# Patient Record
Sex: Female | Born: 1945 | Race: White | Hispanic: No | Smoking: Never smoker
Health system: Southern US, Community
[De-identification: ages and names within clinical notes are randomized; demographics above are authoritative.]

## PROBLEM LIST (undated history)

## (undated) DIAGNOSIS — I219 Acute myocardial infarction, unspecified: Secondary | ICD-10-CM

## (undated) DIAGNOSIS — Z9181 History of falling: Secondary | ICD-10-CM

## (undated) DIAGNOSIS — I10 Essential (primary) hypertension: Secondary | ICD-10-CM

## (undated) DIAGNOSIS — E119 Type 2 diabetes mellitus without complications: Secondary | ICD-10-CM

## (undated) DIAGNOSIS — E1149 Type 2 diabetes mellitus with other diabetic neurological complication: Secondary | ICD-10-CM

## (undated) DIAGNOSIS — I639 Cerebral infarction, unspecified: Secondary | ICD-10-CM

## (undated) HISTORY — PX: APPENDECTOMY: SHX54

## (undated) HISTORY — PX: ABDOMINAL HYSTERECTOMY: SHX81

## (undated) HISTORY — DX: Type 2 diabetes mellitus with other diabetic neurological complication: E11.49

## (undated) HISTORY — PX: BACK SURGERY: SHX140

---
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
## 2019-06-29 ENCOUNTER — Encounter: Payer: Self-pay | Admitting: Emergency Medicine

## 2019-06-29 ENCOUNTER — Other Ambulatory Visit: Payer: Self-pay

## 2019-06-29 ENCOUNTER — Ambulatory Visit (INDEPENDENT_AMBULATORY_CARE_PROVIDER_SITE_OTHER): Payer: Self-pay

## 2019-06-29 ENCOUNTER — Ambulatory Visit
Admission: EM | Admit: 2019-06-29 | Discharge: 2019-06-29 | Disposition: A | Discharge status: Home / Self Care | Payer: Self-pay

## 2019-06-29 DIAGNOSIS — W19XXXA Unspecified fall, initial encounter: Secondary | ICD-10-CM

## 2019-06-29 DIAGNOSIS — R0781 Pleurodynia: Secondary | ICD-10-CM

## 2019-06-29 DIAGNOSIS — M546 Pain in thoracic spine: Secondary | ICD-10-CM

## 2019-06-29 HISTORY — DX: Acute myocardial infarction, unspecified: I21.9

## 2019-06-29 HISTORY — DX: Cerebral infarction, unspecified: I63.9

## 2019-06-29 IMAGING — DX DG THORACIC SPINE 2V
3 series · 3 of 3 positions shown · non-contrast
Comparison: None.

CLINICAL DATA: Pain after fall

EXAM:
THORACIC SPINE 2 VIEWS

[thoracic spine standing ap]
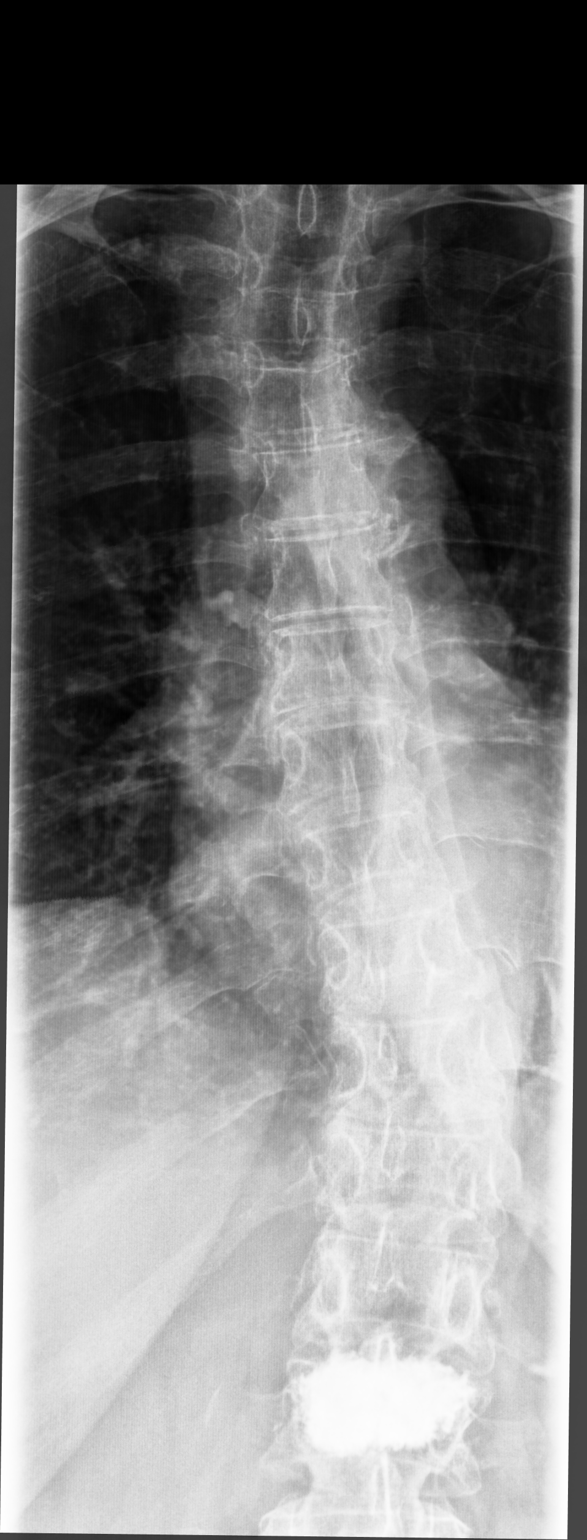

[thoracic spine standing lat (1 of 2)]
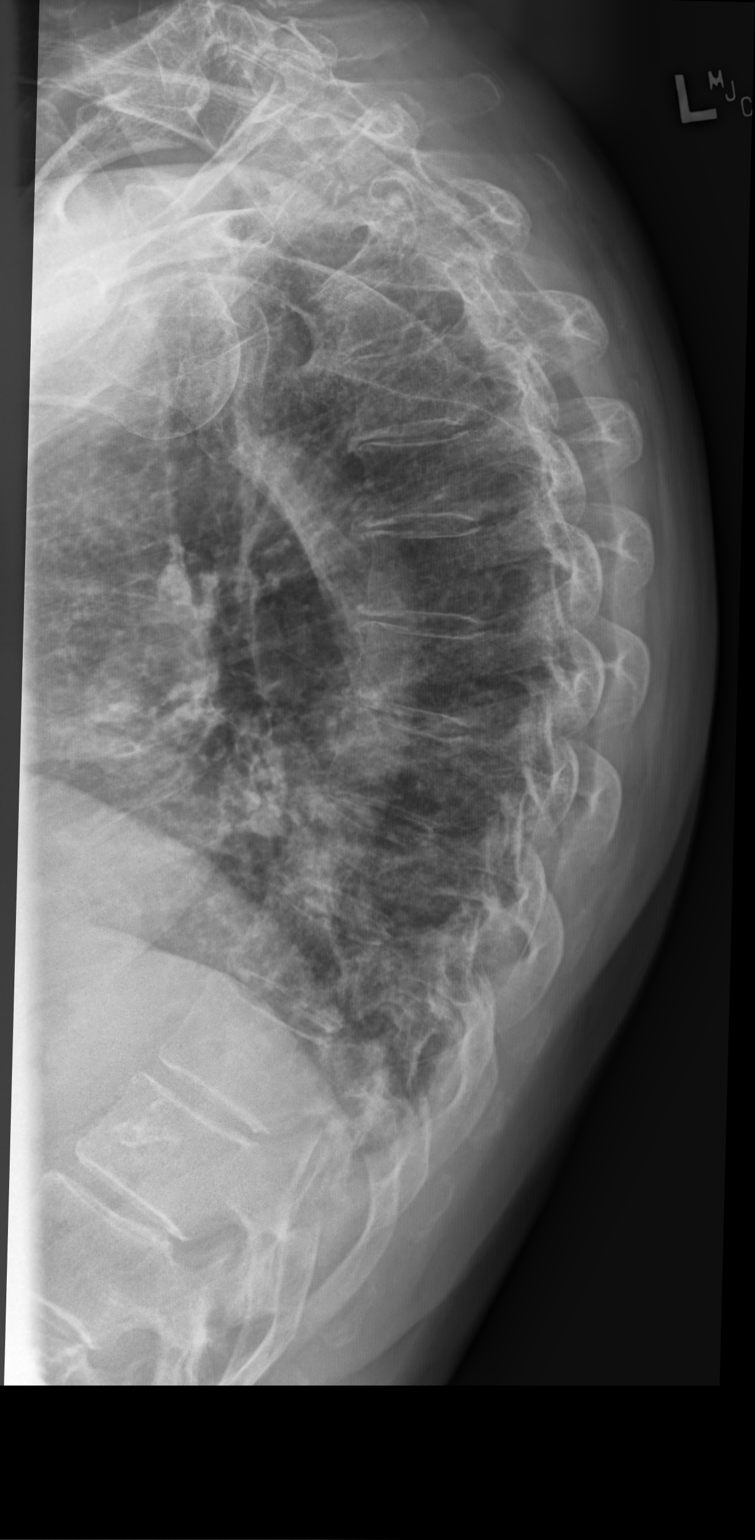

[thoracic spine standing lat (2 of 2)]
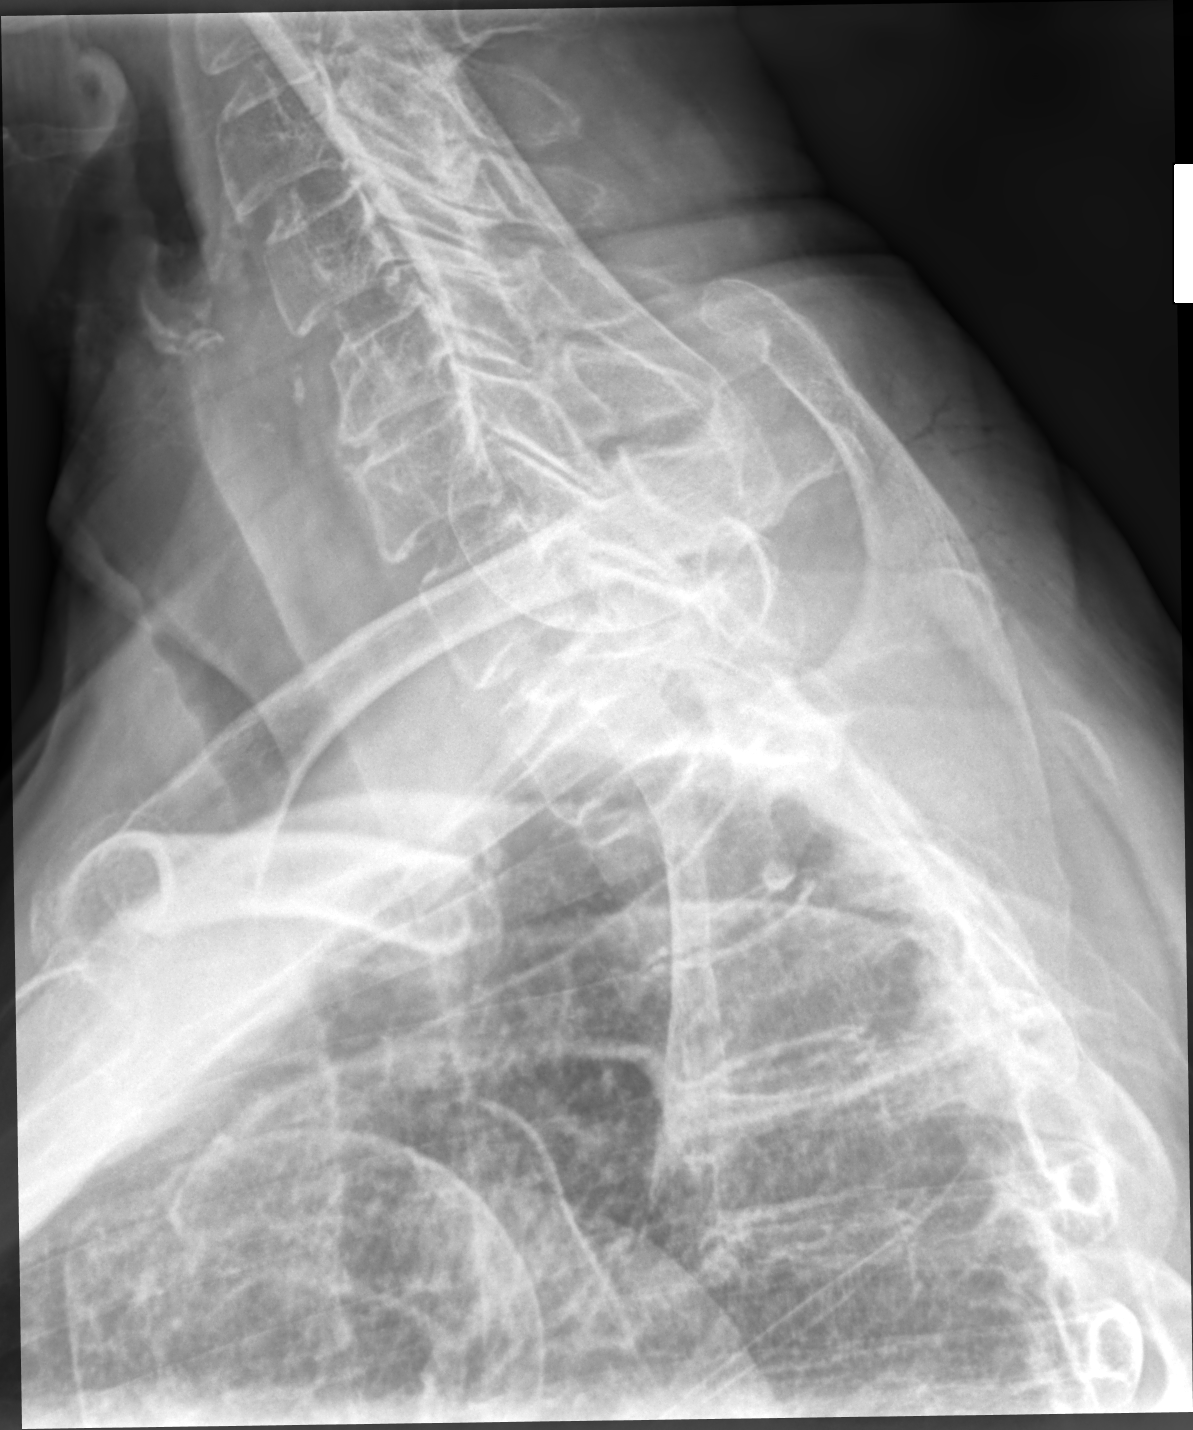

[3 of 3 positions shown; findings below may reference images not displayed]

FINDINGS: Vertebroplasty material seen at L2. Scoliotic curvature of the
thoracic spine is identified. No inter pedicular widening. No
malalignment based on the lateral view. No acute fractures are seen
in the thoracic spine.
IMPRESSION: No acute fractures or traumatic malalignment in the thoracic spine.
L2 vertebroplasty identified.

## 2019-06-29 IMAGING — DX DG RIBS W/ CHEST 3+V*R*
3 series · 3 of 3 positions shown · non-contrast
Comparison: Thoracic spine [DATE]

CLINICAL DATA: Midthoracic pain and right posterior rib pain after
fall.

EXAM:
RIGHT RIBS AND CHEST - 3+ VIEW

[chest pa]
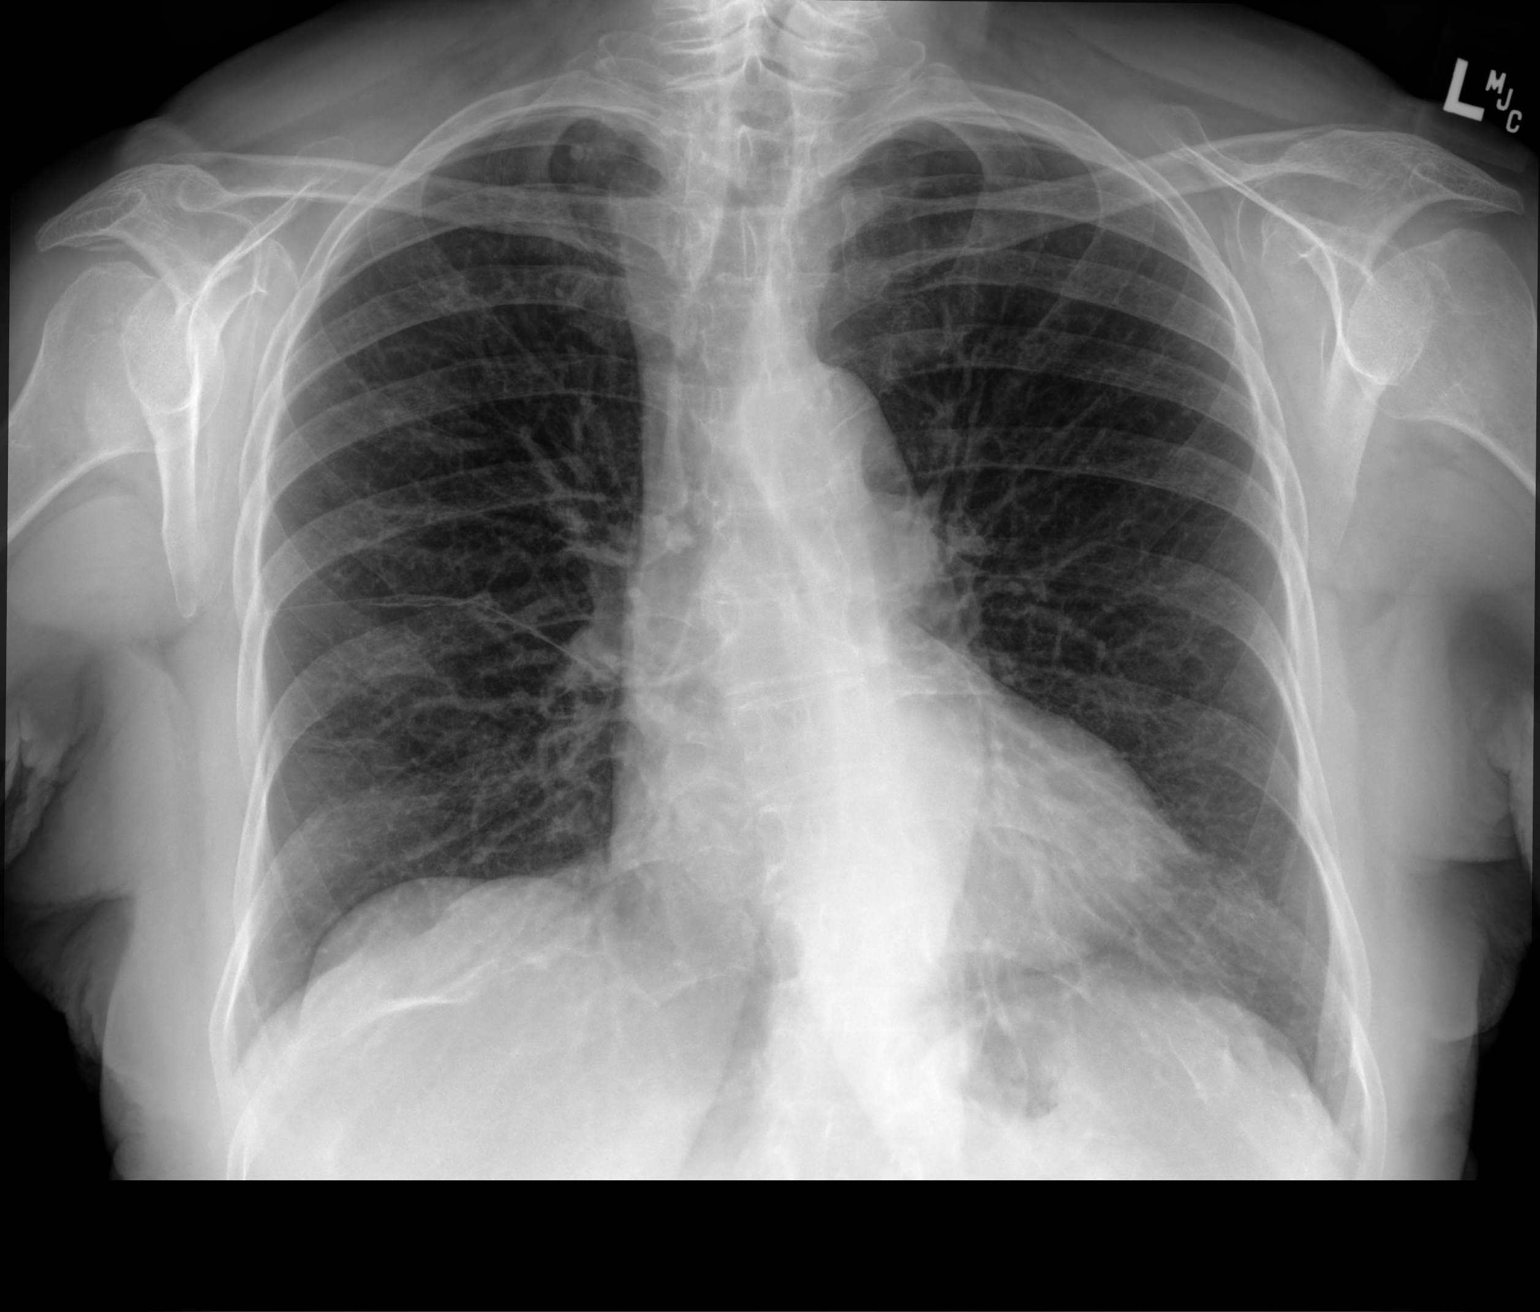

[hemithorax (ribs) ap (1 of 2)]
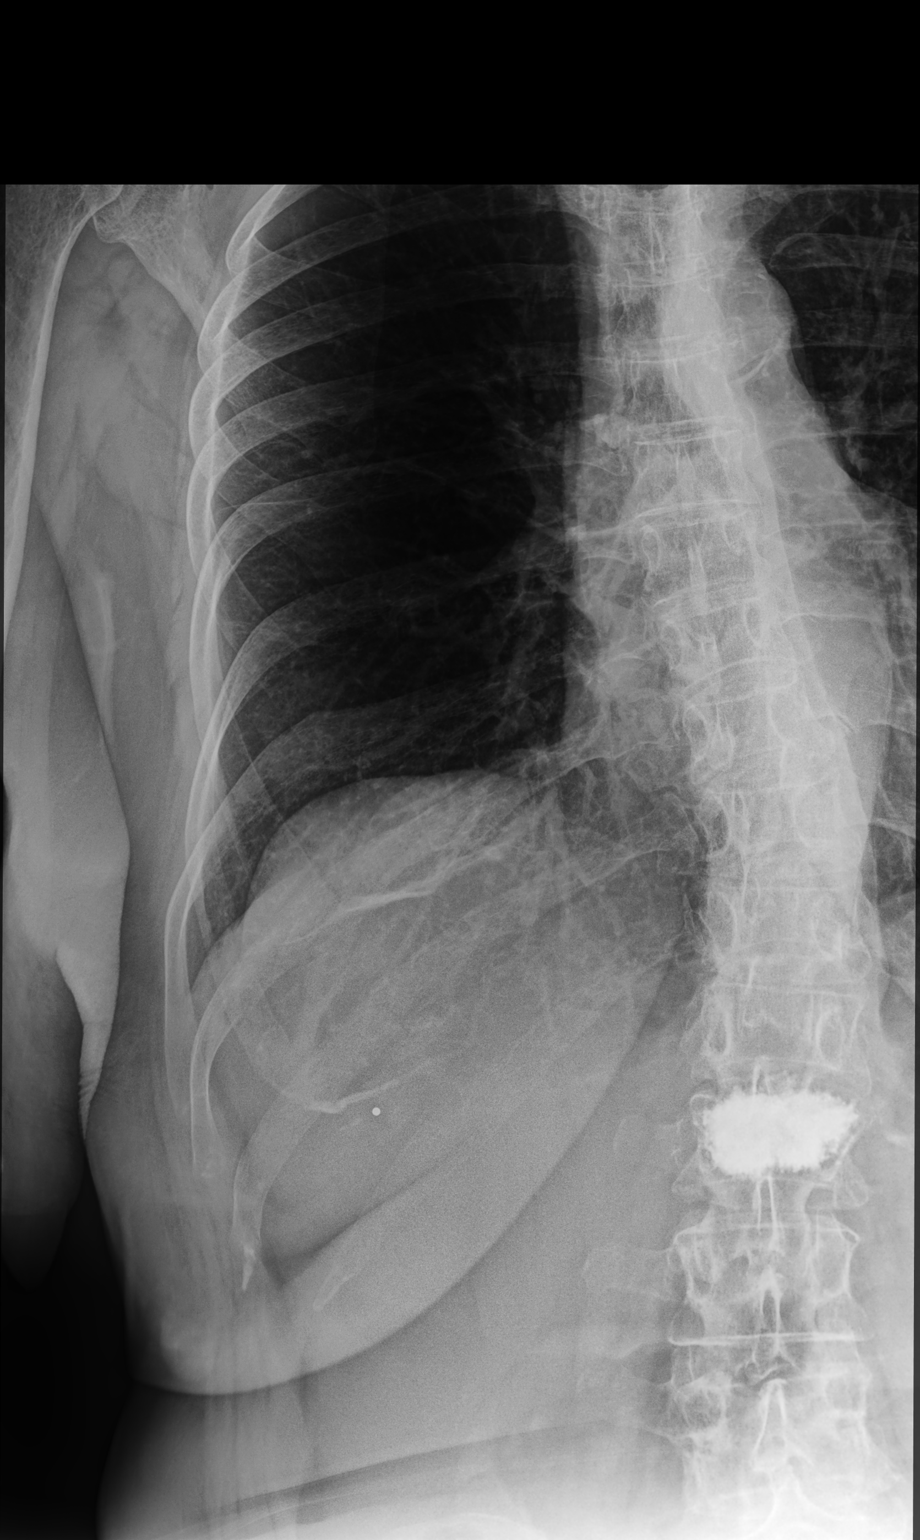

[hemithorax (ribs) ap (2 of 2)]
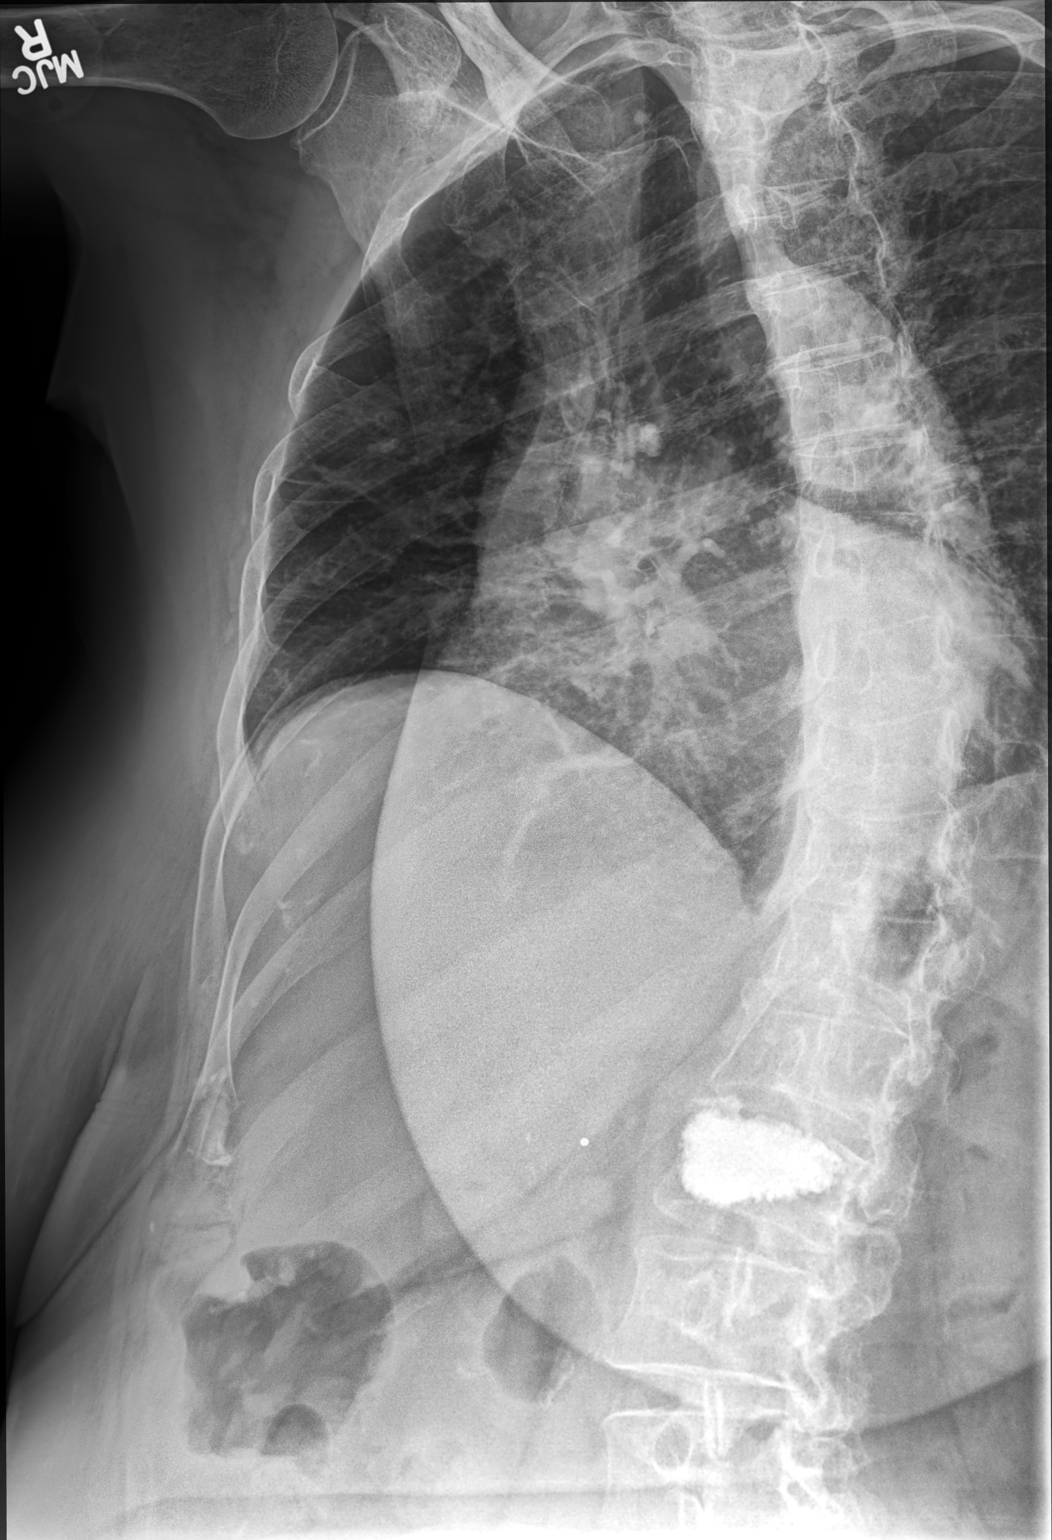

[3 of 3 positions shown; findings below may reference images not displayed]

FINDINGS: Evidence for small calcifications at the right apex. Negative for
pneumothorax. No focal lung disease. Heart and mediastinum are
within normal limits. There is bone cement in the L2 vertebral body.
BB marker was placed in the right lower chest. Negative for a
displaced right rib fracture.
IMPRESSION: 1. No acute cardiopulmonary disease.
2. Negative for right rib fracture.

## 2019-06-29 MED ORDER — PREDNISONE 50 MG PO TABS: 50.0000 mg | ORAL_TABLET | Freq: Every day | ORAL | 0 refills | Status: DC

## 2019-06-29 MED ORDER — KETOROLAC TROMETHAMINE 30 MG/ML IJ SOLN
15.0000 mg | Freq: Once | INTRAMUSCULAR | Status: AC
  Administered 2019-06-29: 15 mg via INTRAMUSCULAR

## 2019-06-29 NOTE — Discharge Instructions (Addendum)
Xray negative for fracture. Toradol injection in office today. Start prednisone as directed. Tylenol to supplement for pain if needed. Warm compress. Make sure to take deep breaths to prevent development of pneumonia. Follow up with orthopedics for further evaluation if symptoms not improving. If worsening, with chest pain/shortness of breath, go to the ED for further evaluation needed.

## 2019-06-29 NOTE — ED Notes (Signed)
Patient able to ambulate independently  

## 2019-06-29 NOTE — ED Triage Notes (Signed)
Pt presents to [facility] for assessment after having two falls on Friday, the first one was early in the morning while she was up in the middle of the night to go to the bathroom.  States she thinks she tripped (cannot exactly remember) and fall backwards onto the bathroom floor, striking a foot bath on her way down with her right lower back.  Intense pain, especially with position changes, to this area since.  Patient denies head injury or LOC.  Patient states she is in the process of moving, and later that day went to sit down on a crate to get to something on a bottom shelf.  States the crate gave out on her and she fell on to the floor, jarring the same area of her back.  Denies head injury or LOC for this fall either.

## 2019-06-29 NOTE — ED Provider Notes (Signed)
[facility]    CSN: [id number] Arrival date & time: 06/29/19  1010      History   Chief Complaint Chief Complaint  Patient presents with   Fall    HPI Tasha [name] is a 73 y.o. female.   73 year old female comes in for back pain after 2 falls.  Patient's first fall was 3 days ago, she was feeling for the restroom and her dark, tripped and fell backwards, hitting a foot bath to the right middle back.  Area has been tender, worse with position change since.  She denies head injury, loss of consciousness.  Patient states later that day, she had pain while walking, and was trying to sit down and rest on a crate.  Crate gave out, and she fell to the floor, hitting the same area of the back.  Again, denies head injury or loss of consciousness.  She points to the mid back when asked about pain, and denies any radiation.  She denies any weakness, dizziness, syncope causing the fall.  She denies chest pain, shortness of breath, abdominal pain, hematuria.  She denies any saddle anesthesia, loss of bladder or bowel control. Has been taking ibuprofen 400mg  Q6H without any relief.      Past Medical History:  Diagnosis Date   Hypertension    MI (myocardial infarction) (HCC)    Stroke (HCC)     There are no active problems to display for this patient.   Past Surgical History:  Procedure Laterality Date   ABDOMINAL HYSTERECTOMY     APPENDECTOMY      OB History   No obstetric history on file.      Home Medications    Prior to Admission medications   Medication Sig Start Date End Date Taking? Authorizing Provider  amLODipine (NORVASC) 10 MG tablet Take 10 mg by mouth daily.   Yes [provider]  predniSONE (DELTASONE) 50 MG tablet Take 1 tablet (50 mg total) by mouth daily with breakfast. 06/29/19   Belinda FisherYu, Cerita Rabelo V, PA-C    Family History Family History  Family history unknown: Yes    Social History Social History   Tobacco Use   Smoking status:  Never Smoker   Smokeless tobacco: Never Used  Substance Use Topics   Alcohol use: Yes    Comment: occasionally   Drug use: Never     Allergies   Allopurinol, Codeine, and Sulfa antibiotics   Review of Systems Review of Systems  Reason unable to perform ROS: See HPI as above.     Physical Exam Triage Vital Signs ED Triage Vitals  Enc Vitals Group     BP 06/29/19 1026 (!) 169/89     Pulse Rate 06/29/19 1026 79     Resp 06/29/19 1026 18     Temp 06/29/19 1026 98.3 F (36.8 C)     Temp Source 06/29/19 1026 Oral     SpO2 06/29/19 1026 96 %     Weight --      Height --      Head Circumference --      Peak Flow --      Pain Score 06/29/19 1027 9     Pain Loc --      Pain Edu? --      Excl. in GC? --    No data found.  Updated Vital Signs BP (!) 169/89 (BP Location: Left Arm)    Pulse 79    Temp 98.3 F (36.8 C) (Oral)  Resp 18    SpO2 96%   Physical Exam Constitutional:      General: She is not in acute distress.    Appearance: She is well-developed. She is not diaphoretic.  HENT:     Head: Normocephalic and atraumatic.  Eyes:     Conjunctiva/sclera: Conjunctivae normal.     Pupils: Pupils are equal, round, and reactive to light.  Neck:     Musculoskeletal: Normal range of motion and neck supple. No pain with movement, spinous process tenderness or muscular tenderness.  Cardiovascular:     Rate and Rhythm: Normal rate and regular rhythm.     Heart sounds: Normal heart sounds. No murmur. No friction rub. No gallop.   Pulmonary:     Effort: Pulmonary effort is normal. No accessory muscle usage or respiratory distress.     Breath sounds: Normal breath sounds. No stridor. No decreased breath sounds, wheezing, rhonchi or rales.  Musculoskeletal:     Comments: No swelling, deformity, contusion seen.  Patient tenderness to palpation of lower thoracic spinous processes.  Has diffuse tenderness to palpation of right thoracic back.  No tenderness to palpation of the  shoulder or hips.  Patient has decreased range of motion of shoulder due to exacerbating back pain.  Full range of motion of the hips.  Sensation intact and equal bilaterally.  Grip strength normal.  Radial pulse 2+, cap refill less than 2 seconds.  Skin:    General: Skin is warm and dry.  Neurological:     Mental Status: She is alert and oriented to person, place, and time.      UC Treatments / Results  Labs (all labs ordered are listed, but only abnormal results are displayed) Labs Reviewed - No data to display  EKG   Radiology Dg Ribs Unilateral W/chest Right  Result Date: 06/29/2019 CLINICAL DATA:  Midthoracic pain and right posterior rib pain after fall. EXAM: RIGHT RIBS AND CHEST - 3+ VIEW COMPARISON:  Thoracic spine 04/29/2019 FINDINGS: Evidence for small calcifications at the right apex. Negative for pneumothorax. No focal lung disease. Heart and mediastinum are within normal limits. There is bone cement in the L2 vertebral body. BB marker was placed in the right lower chest. Negative for a displaced right rib fracture. IMPRESSION: 1. No acute cardiopulmonary disease. 2. Negative for right rib fracture. Electronically Signed   By: Markus Daft M.D.   On: 06/29/2019 11:31   Dg Thoracic Spine 2 View  Result Date: 06/29/2019 CLINICAL DATA:  Pain after fall EXAM: THORACIC SPINE 2 VIEWS COMPARISON:  None. FINDINGS: Vertebroplasty material seen at L2. 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 No inter pedicular widening. No malalignment based on the lateral view. No acute fractures are seen in the thoracic spine. IMPRESSION: No acute fractures or traumatic malalignment in the thoracic spine. L2 vertebroplasty identified. Electronically Signed   By: Dorise Bullion III M.D   On: 06/29/2019 11:34    Procedures Procedures (including critical care time)  Medications Ordered in UC Medications  ketorolac (TORADOL) 30 MG/ML injection 15 mg (15 mg Intramuscular Given 06/29/19  1204)    Initial Impression / Assessment and Plan / UC Course  I have reviewed the triage vital signs and the nursing notes.  Pertinent labs & imaging results that were available during my care of the patient were reviewed by me and considered in my medical decision making (see chart for details).    Given age, 2 falls, with midline tenderness, will obtain thoracic  back xray for evaluation. Will also obtain rib series.   Discussed xray results with patient. Patient at first had denied back trauma/chronic pain, however, remembered surgery after discussing xray results. States she has not had pain/complications since surgery. Will provide toradol injection and prednisone for symptoms. Patient with history of reaction to codeine, will avoid narcotics for now. Strict return precautions given.  Patient expresses understanding and agrees to plan.  Final Clinical Impressions(s) / UC Diagnoses   Final diagnoses:  Acute right-sided thoracic back pain  Rib pain on right side  Fall, initial encounter   ED Prescriptions    Medication Sig Dispense Auth. Provider   predniSONE (DELTASONE) 50 MG tablet Take 1 tablet (50 mg total) by mouth daily with breakfast. 5 tablet Belinda Fisher, PA-C     PDMP not reviewed this encounter.   Belinda Fisher, PA-C 06/29/19 1231

## 2019-06-30 ENCOUNTER — Telehealth: Payer: Self-pay | Admitting: Emergency Medicine

## 2019-06-30 NOTE — Telephone Encounter (Signed)
Left voicemail checking in on patient, and encouraged return call with any continuing questions or concerns.    

## 2020-01-02 ENCOUNTER — Other Ambulatory Visit: Payer: Self-pay

## 2020-01-02 ENCOUNTER — Emergency Department (HOSPITAL_COMMUNITY)
Admission: EM | Admit: 2020-01-02 | Discharge: 2020-01-02 | Disposition: A | Discharge status: Home / Self Care | Payer: Self-pay | Attending: Emergency Medicine | Admitting: Emergency Medicine

## 2020-01-02 ENCOUNTER — Emergency Department (HOSPITAL_COMMUNITY): Payer: Self-pay

## 2020-01-02 ENCOUNTER — Encounter (HOSPITAL_COMMUNITY): Payer: Self-pay

## 2020-01-02 DIAGNOSIS — Y929 Unspecified place or not applicable: Secondary | ICD-10-CM | POA: Insufficient documentation

## 2020-01-02 DIAGNOSIS — Z8673 Personal history of transient ischemic attack (TIA), and cerebral infarction without residual deficits: Secondary | ICD-10-CM | POA: Insufficient documentation

## 2020-01-02 DIAGNOSIS — I10 Essential (primary) hypertension: Secondary | ICD-10-CM | POA: Insufficient documentation

## 2020-01-02 DIAGNOSIS — Y939 Activity, unspecified: Secondary | ICD-10-CM | POA: Insufficient documentation

## 2020-01-02 DIAGNOSIS — M25521 Pain in right elbow: Secondary | ICD-10-CM | POA: Insufficient documentation

## 2020-01-02 DIAGNOSIS — I252 Old myocardial infarction: Secondary | ICD-10-CM | POA: Insufficient documentation

## 2020-01-02 DIAGNOSIS — Y998 Other external cause status: Secondary | ICD-10-CM | POA: Insufficient documentation

## 2020-01-02 DIAGNOSIS — H538 Other visual disturbances: Secondary | ICD-10-CM | POA: Insufficient documentation

## 2020-01-02 DIAGNOSIS — W19XXXA Unspecified fall, initial encounter: Secondary | ICD-10-CM | POA: Insufficient documentation

## 2020-01-02 LAB — URINALYSIS, ROUTINE W REFLEX MICROSCOPIC
Bacteria, UA: NONE SEEN
Bilirubin Urine: NEGATIVE
Glucose, UA: NEGATIVE mg/dL
Ketones, ur: 5 mg/dL — AB
Leukocytes,Ua: NEGATIVE
Nitrite: NEGATIVE
Protein, ur: 100 mg/dL — AB
Specific Gravity, Urine: 1.018 (ref 1.005–1.030)
pH: 5 (ref 5.0–8.0)

## 2020-01-02 LAB — PROTIME-INR
INR: 1.1 (ref 0.8–1.2)
Prothrombin Time: 13.3 seconds (ref 11.4–15.2)

## 2020-01-02 LAB — CBC
HCT: 40.2 % (ref 36.0–46.0)
Hemoglobin: 13 g/dL (ref 12.0–15.0)
MCH: 28.4 pg (ref 26.0–34.0)
MCHC: 32.3 g/dL (ref 30.0–36.0)
MCV: 87.8 fL (ref 80.0–100.0)
Platelets: 317 10*3/uL (ref 150–400)
RBC: 4.58 MIL/uL (ref 3.87–5.11)
RDW: 13.3 % (ref 11.5–15.5)
WBC: 8.5 10*3/uL (ref 4.0–10.5)
nRBC: 0 % (ref 0.0–0.2)

## 2020-01-02 LAB — COMPREHENSIVE METABOLIC PANEL
ALT: 15 U/L (ref 0–44)
AST: 12 U/L — ABNORMAL LOW (ref 15–41)
Albumin: 4.5 g/dL (ref 3.5–5.0)
Alkaline Phosphatase: 50 U/L (ref 38–126)
Anion gap: 13 (ref 5–15)
BUN: 22 mg/dL (ref 8–23)
CO2: 26 mmol/L (ref 22–32)
Calcium: 9.9 mg/dL (ref 8.9–10.3)
Chloride: 104 mmol/L (ref 98–111)
Creatinine, Ser: 0.99 mg/dL (ref 0.44–1.00)
GFR calc Af Amer: >60 mL/min (ref >60)
GFR calc non Af Amer: 57 mL/min — ABNORMAL LOW (ref >60)
Glucose, Bld: 148 mg/dL — ABNORMAL HIGH (ref 70–99)
Potassium: 3.7 mmol/L (ref 3.5–5.1)
Sodium: 143 mmol/L (ref 135–145)
Total Bilirubin: 0.8 mg/dL (ref 0.3–1.2)
Total Protein: 7.6 g/dL (ref 6.5–8.1)

## 2020-01-02 LAB — ETHANOL: Alcohol, Ethyl (B): <10 mg/dL (ref <10)

## 2020-01-02 LAB — DIFFERENTIAL
Abs Immature Granulocytes: 0.07 10*3/uL (ref 0.00–0.07)
Basophils Absolute: 0.1 10*3/uL (ref 0.0–0.1)
Basophils Relative: 1 %
Eosinophils Absolute: 0.2 10*3/uL (ref 0.0–0.5)
Eosinophils Relative: 2 %
Immature Granulocytes: 1 %
Lymphocytes Relative: 22 %
Lymphs Abs: 1.9 10*3/uL (ref 0.7–4.0)
Monocytes Absolute: 0.6 10*3/uL (ref 0.1–1.0)
Monocytes Relative: 7 %
Neutro Abs: 5.7 10*3/uL (ref 1.7–7.7)
Neutrophils Relative %: 67 %

## 2020-01-02 LAB — CK: Total CK: 36 U/L — ABNORMAL LOW (ref 38–234)

## 2020-01-02 LAB — RAPID URINE DRUG SCREEN, HOSP PERFORMED
Amphetamines: NOT DETECTED
Barbiturates: NOT DETECTED
Benzodiazepines: NOT DETECTED
Cocaine: NOT DETECTED
Opiates: NOT DETECTED
Tetrahydrocannabinol: NOT DETECTED

## 2020-01-02 LAB — APTT: aPTT: 28 seconds (ref 24–36)

## 2020-01-02 IMAGING — CT CT HEAD W/O CM
3 series · 15 of 47 positions shown, 18 images · non-contrast
Comparison: None.

CLINICAL DATA: Recent fall, blurred vision

EXAM:
CT HEAD WITHOUT CONTRAST
TECHNIQUE: Contiguous axial images were obtained from the base of the skull
through the vertex without intravenous contrast.

[Series 2: head wo · axial · 0.47mm/px · z∈[-115,+10]mm · 9 of 31 slices shown, 12 images]
[im 3/31  brain]
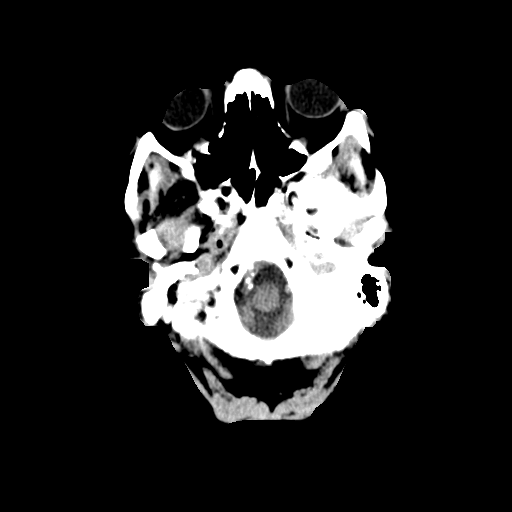
[im 3/31  bone]
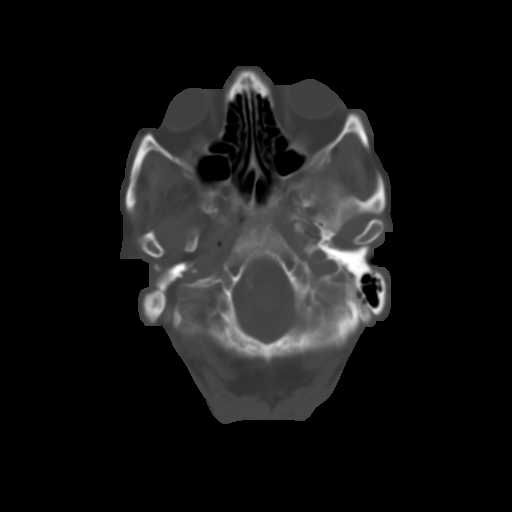
[im 6/31  brain]
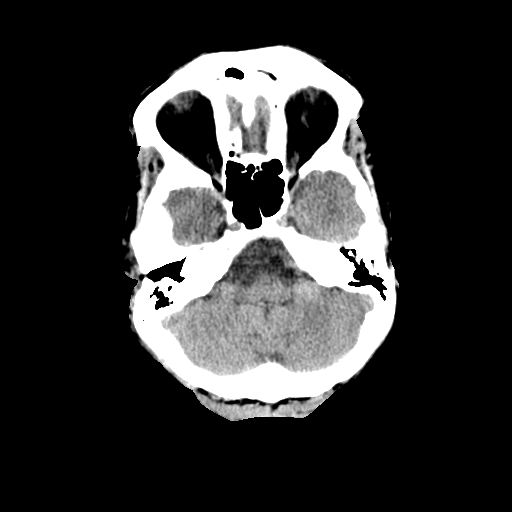
[im 9/31  brain]
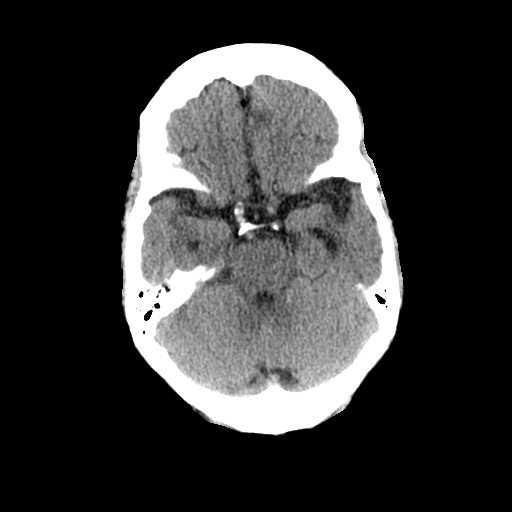
[im 12/31  brain]
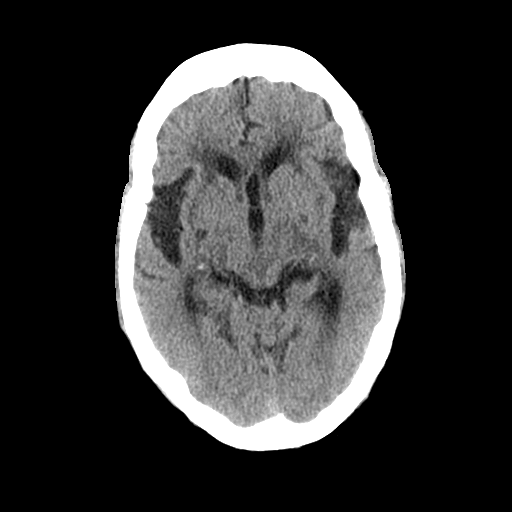
[im 16/31  brain]
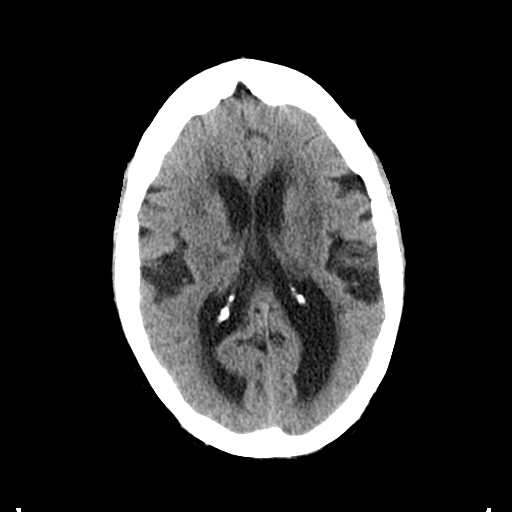
[im 16/31  bone]
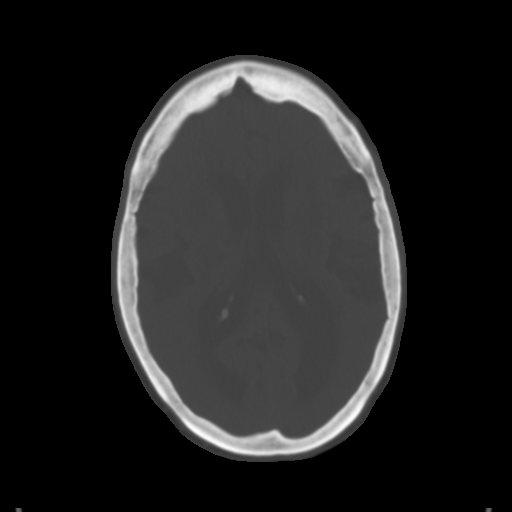
[im 19/31  brain]
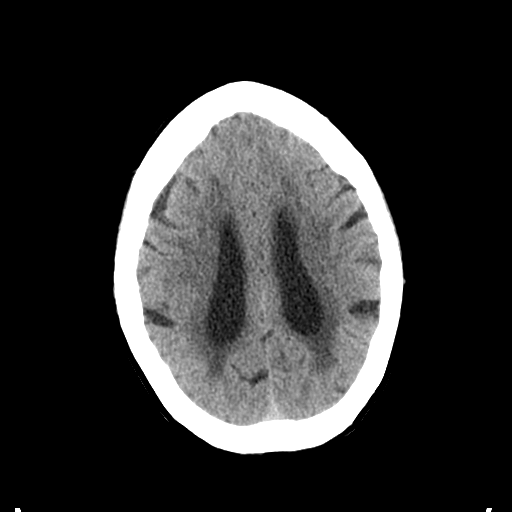
[im 22/31  brain]
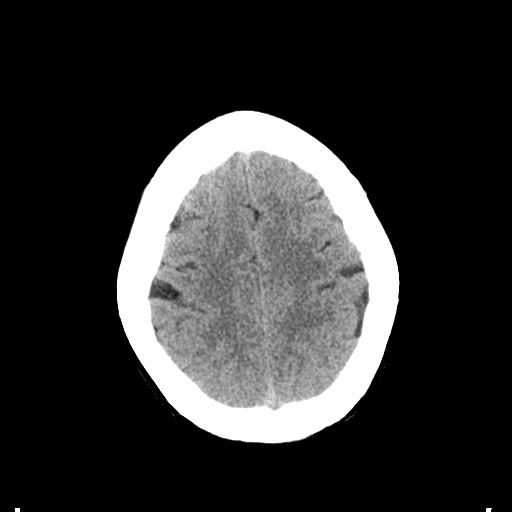
[im 25/31  brain]
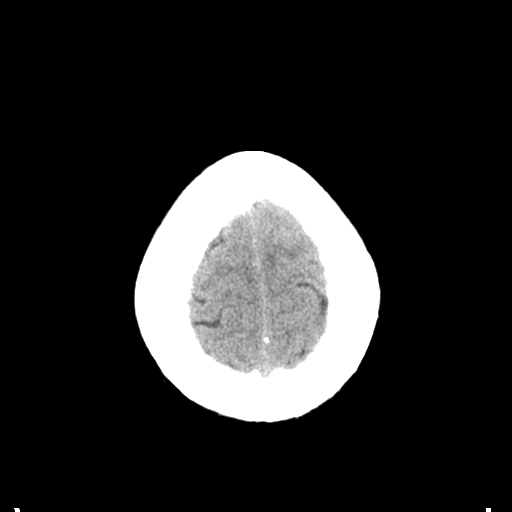
[im 28/31  brain]
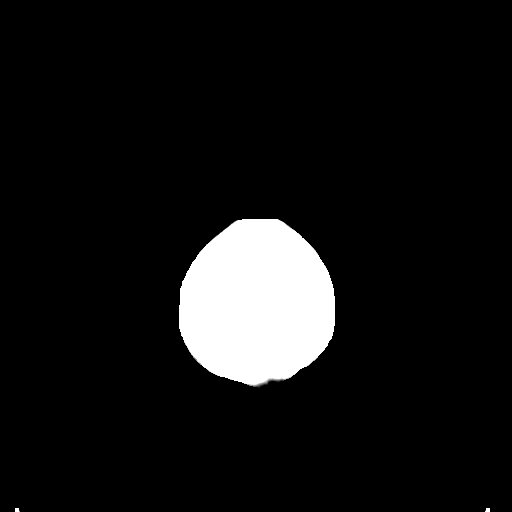
[im 28/31  bone]
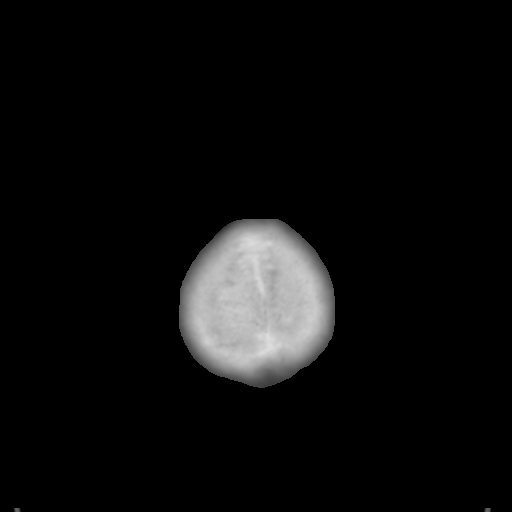

[Series 5: coronal soft tissue · coronal · 0.29mm/px · 3 of 83 slices shown]
[im 28/83  brain]
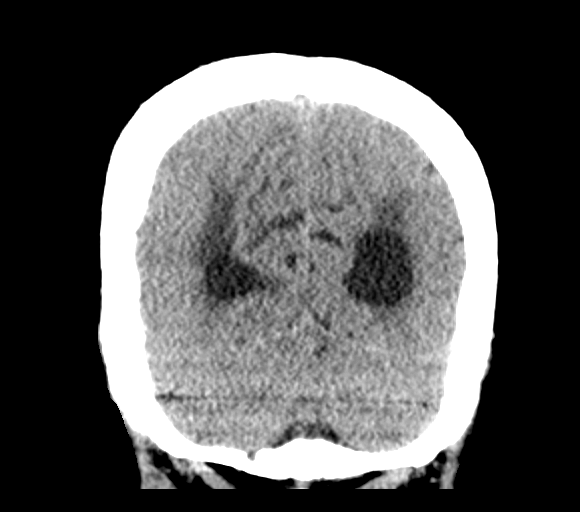
[im 37/83  brain]
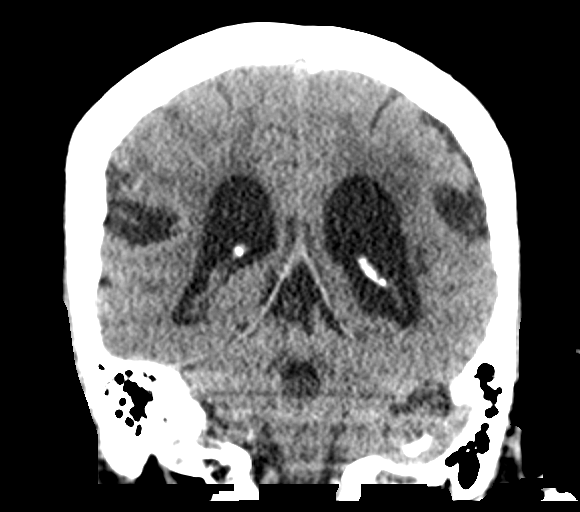
[im 46/83  brain]
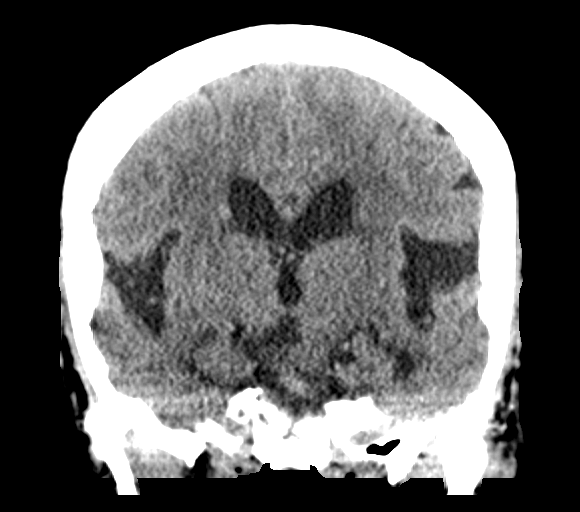

[Series 6: sagittal soft tissue · sagittal · 0.29mm/px · 3 of 61 slices shown]
[im 21/61  brain]
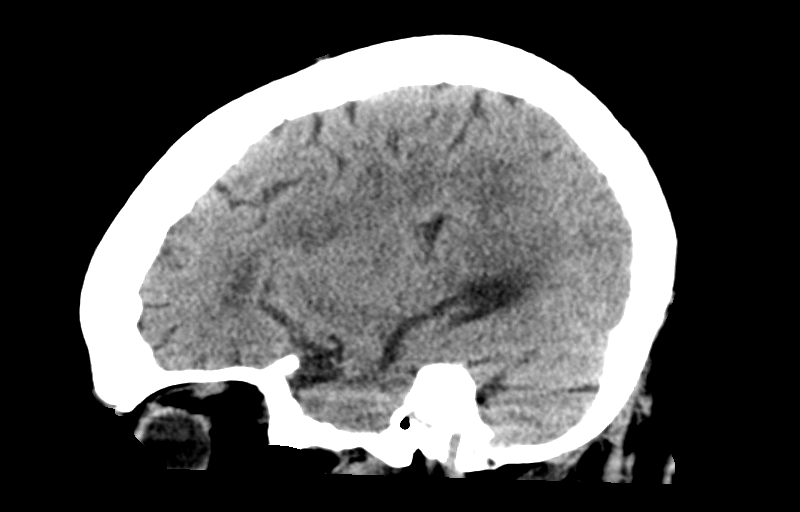
[im 31/61  brain]
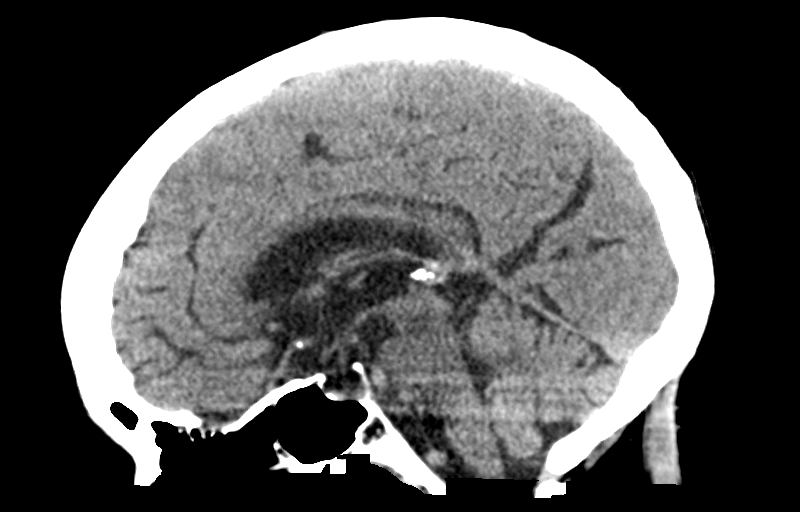
[im 41/61  brain]
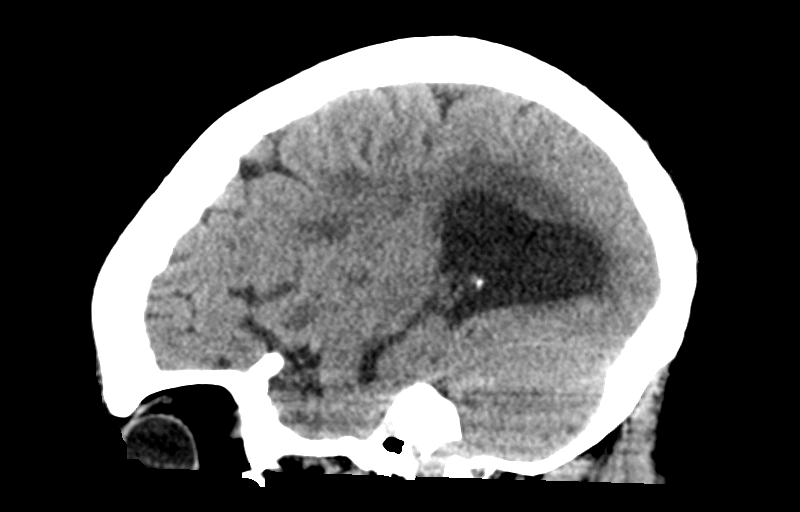

[15 of 47 positions shown; findings below may reference images not displayed]

FINDINGS: Brain: No evidence of acute infarction, hemorrhage, hydrocephalus,
extra-axial collection or mass lesion/mass effect. Small lacunar
infarcts within the bilateral basal ganglia. Extensive low-density
changes within the periventricular and subcortical white matter
compatible with chronic microvascular ischemic change. Moderate
diffuse cerebral volume loss.

Vascular: Atherosclerotic calcifications involving the large vessels
of the skull base. No unexpected hyperdense vessel.

Skull: Normal. Negative for fracture or focal lesion.

Sinuses/Orbits: No acute finding.

Other: None.
IMPRESSION: 1.  No acute intracranial findings.

2.  Chronic microvascular ischemic change and cerebral volume loss.

## 2020-01-02 IMAGING — MR MR HEAD WO/W CM
10 of 12 series · 33 of 48 positions shown · IV contrast (GADAVIST)
Comparison: None.

CLINICAL DATA: Recent fall.  Double vision today.

EXAM:
MRI HEAD AND ORBITS WITHOUT AND WITH CONTRAST
TECHNIQUE: Multiplanar, multiecho pulse sequences of the brain and surrounding
structures were obtained without and with intravenous contrast.
Multiplanar, multiecho pulse sequences of the orbits and surrounding
structures were obtained including fat saturation techniques, before
and after intravenous contrast administration.
CONTRAST:  7mL GADAVIST GADOBUTROL 1 MMOL/ML IV SOLN

[Series 12: T2 · sagittal · 5.0mm · 0.47mm/px · 2 of 24 slices shown (1 of 2)]
[im 1/24]
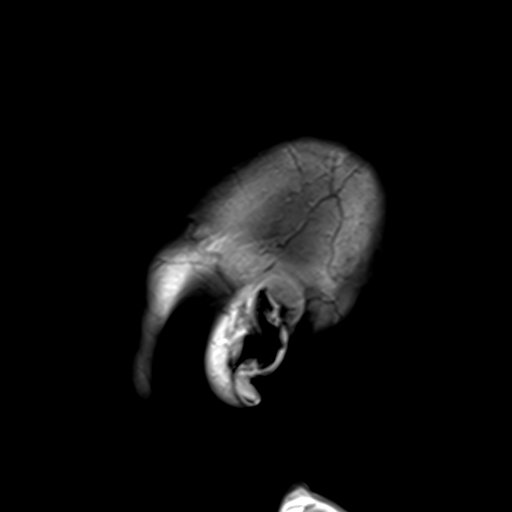
[im 24/24]
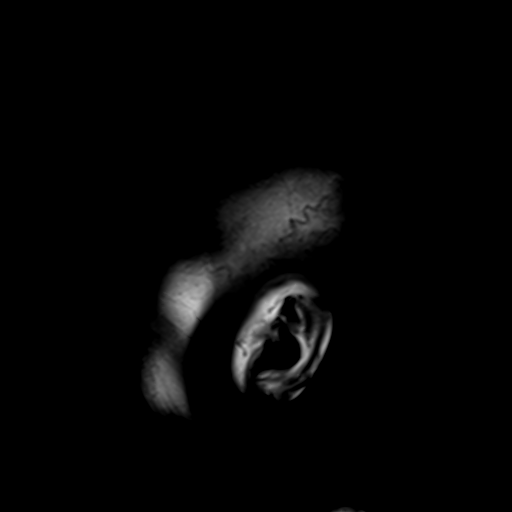

[Series 13: T2 · axial · 5.0mm · 0.45mm/px · z∈[-93,+67]mm · 2 of 26 slices shown (2 of 2)]
[im 1/26]
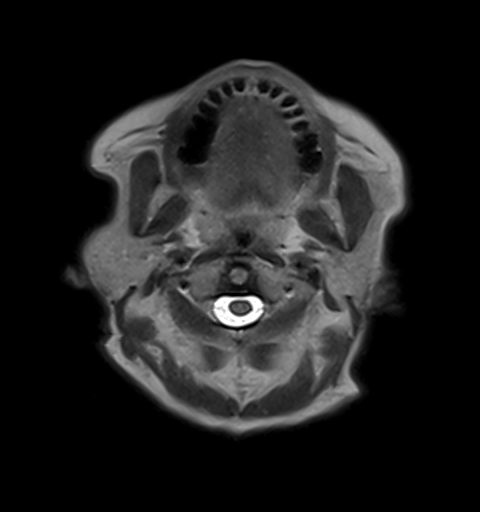
[im 26/26]
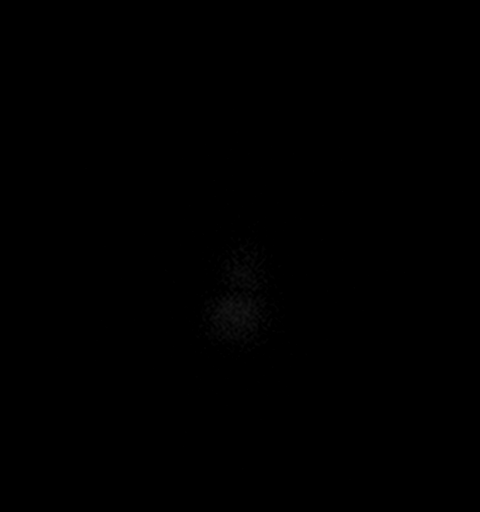

[Series 14: GRE · axial · 3.0mm · 0.45mm/px · z∈[-86,+61]mm · 4 of 51 slices shown]
[im 1/51]
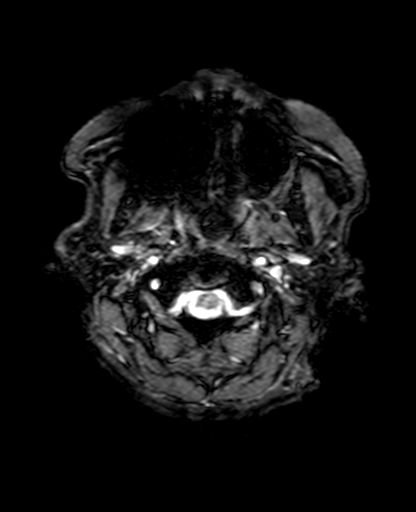
[im 17/51]
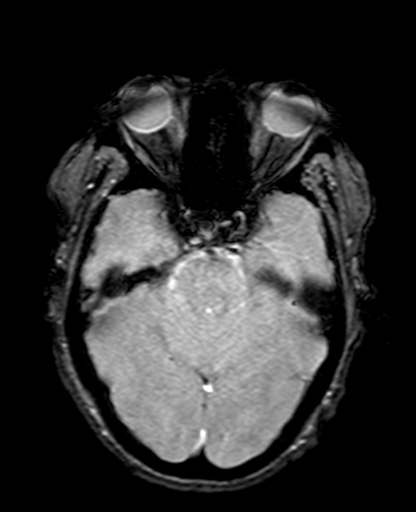
[im 34/51]
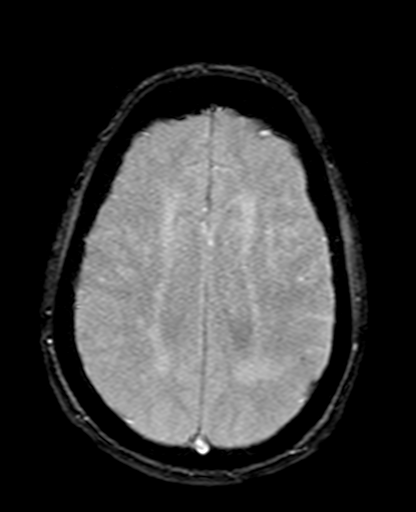
[im 51/51]
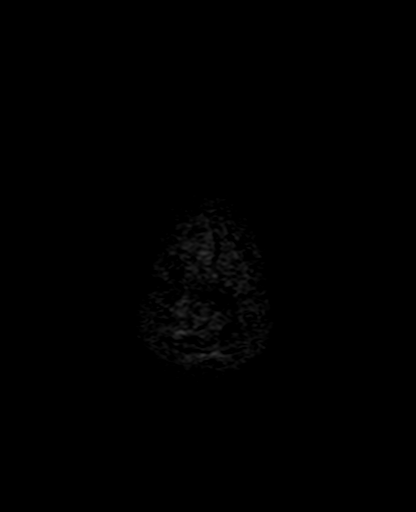

[Series 15: FLAIR · axial · 3.0mm · 0.86mm/px · z∈[-87,+60]mm · 4 of 51 slices shown]
[im 1/51]
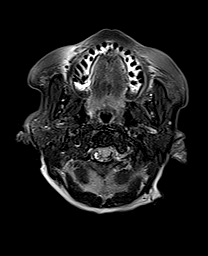
[im 17/51]
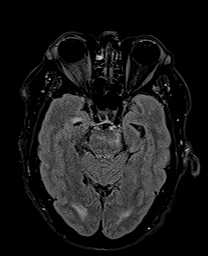
[im 34/51]
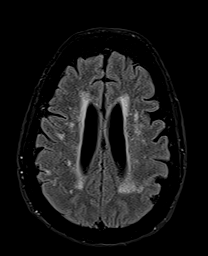
[im 51/51]
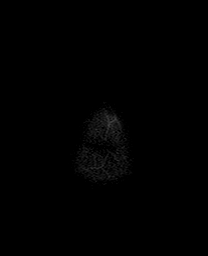

[Series 16: T1 · axial · 3.0mm · 0.45mm/px · z∈[-86,+11]mm · 3 of 51 slices shown]
[im 1/51]
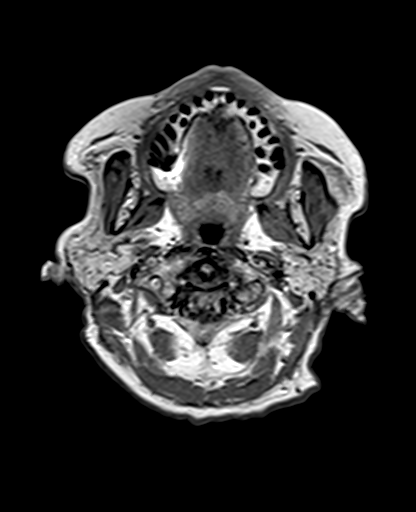
[im 17/51]
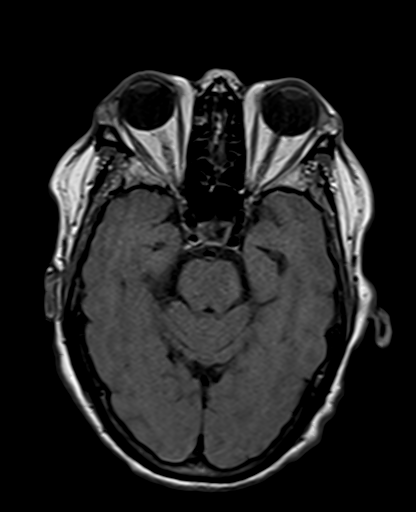
[im 34/51]
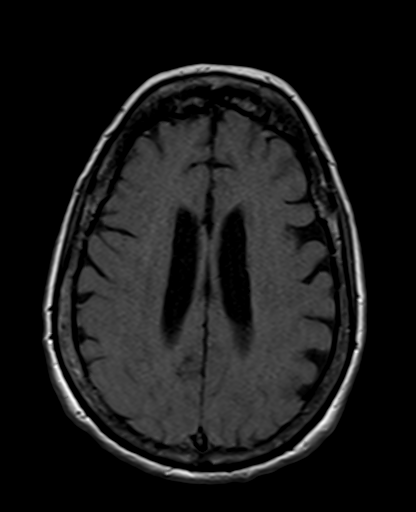

[Series 17: DWI · coronal · 5.0mm · 1.31mm/px · 5 of 64 slices shown (1 of 2)]
[im 1/64]
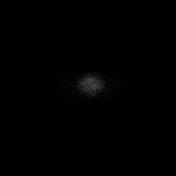
[im 16/64]
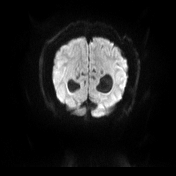
[im 32/64]
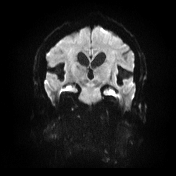
[im 48/64]
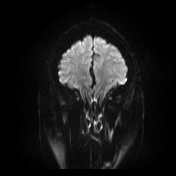
[im 64/64]
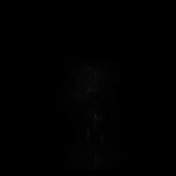

[Series 18: DWI · coronal · 5.0mm · 1.31mm/px · 3 of 31 slices shown (2 of 2)]
[im 1/31]
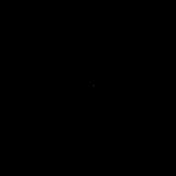
[im 16/31]
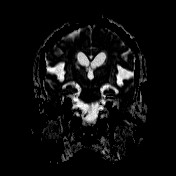
[im 31/31]
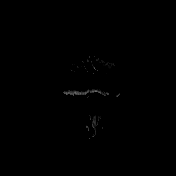

[Series 21: T1 post-contrast · axial · 3.0mm · 0.45mm/px · z∈[-80,+55]mm · 4 of 47 slices shown (1 of 2)]
[im 1/47]
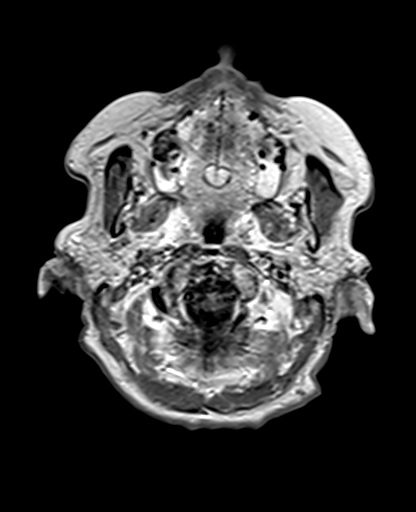
[im 16/47]
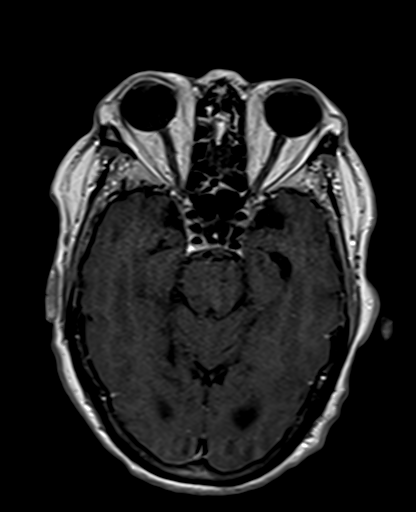
[im 31/47]
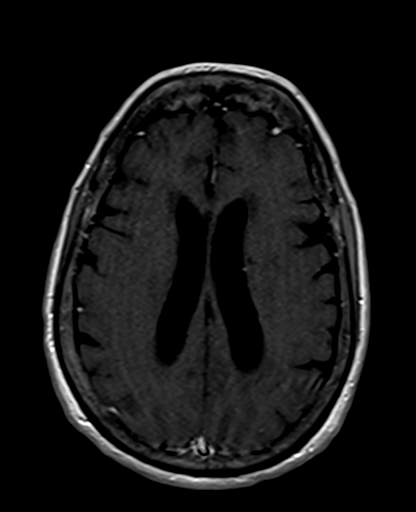
[im 47/47]
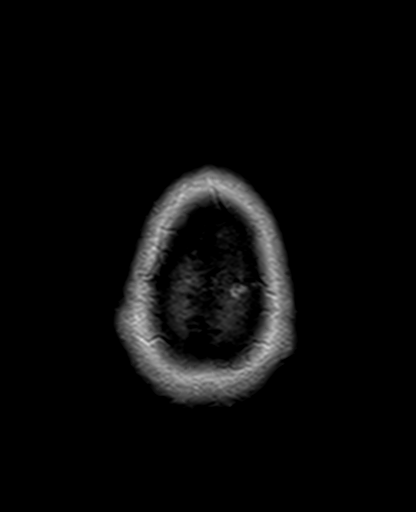

[Series 22: T2 post-contrast · coronal · 5.0mm · 0.86mm/px · 3 of 32 slices shown]
[im 1/32]
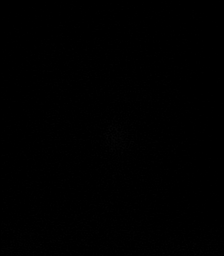
[im 16/32]
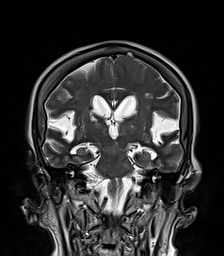
[im 32/32]
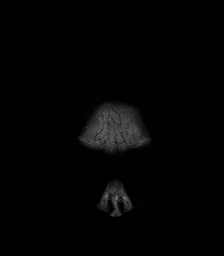

[Series 23: T1 post-contrast · coronal · 5.0mm · 0.43mm/px · 3 of 32 slices shown (2 of 2)]
[im 1/32]
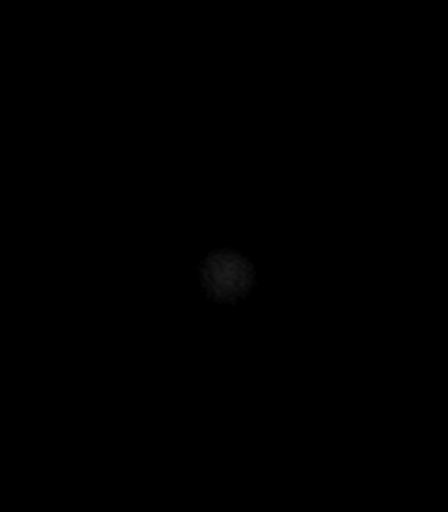
[im 16/32]
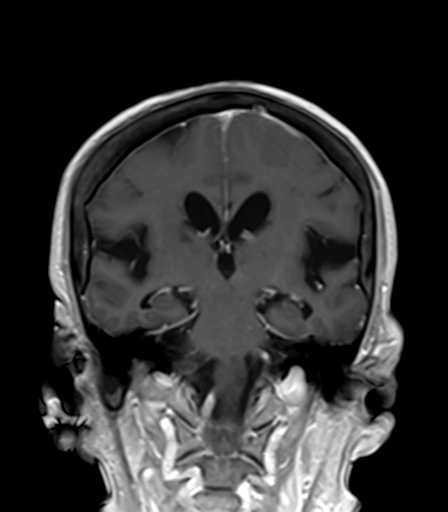
[im 32/32]
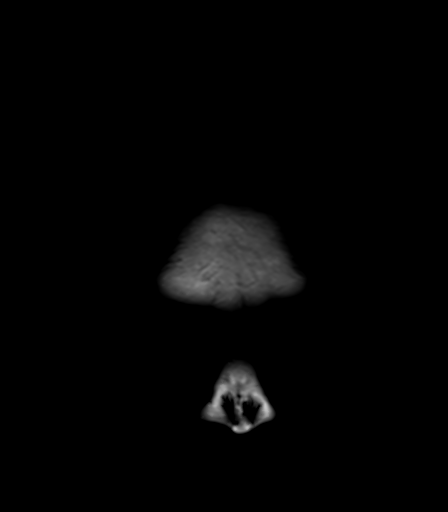

[33 of 48 positions shown; findings below may reference images not displayed]

FINDINGS: MRI HEAD FINDINGS

Brain: No acute infarction, hemorrhage, hydrocephalus, extra-axial
collection or mass lesion. Chronic small vessel ischemia with
confluent gliosis in the periventricular white matter. Chronic
Lacunes seen in the deep gray nuclei and brainstem. Remote
microhemorrhage at the right thalamus, often hypertensive in this
location.

Cerebral volume loss with a perisylvian predilection.
Ventriculomegaly is likely from atrophy, despite the degree of
disproportionate subarachnoid spaces. For normal pressure
hydrocephalus, would expect even greater ventriculomegaly.

Vascular: Normal flow voids and vascular enhancements

Skull and upper cervical spine: Normal marrow signal.

MRI ORBITS FINDINGS

Orbits: No traumatic or inflammatory finding. Globes, optic nerves,
orbital fat, extraocular muscles, vascular structures, and lacrimal
glands are normal.

Visualized sinuses: Clear

Soft tissues: Negative

Limited intracranial: Stippled areas postcontrast imaging of
enhancement in the brain on axial is most likely vascular structures
given the overall pattern. Subtle and more vague enhancement is seen
left of the cerebral aqua duct.
IMPRESSION: Brain MRI:

1. No acute infarct.
2. Moderate to extensive chronic small vessel ischemia.

Orbits:

1. On thin-section postcontrast images there are a few stippled
areas of enhancement seen at the brainstem and periaqueductal gray
matter. Suspect this is vascular enhancement, but if symptoms
persist and are unexplained recommend follow-up brain MRI with
contrast to ensure these are not related to a granulomatous or
lymphomatous process.
2. Negative orbits.  No mass, traumatic finding, or inflammation.

## 2020-01-02 IMAGING — MR MR HEAD WO/W CM
11 of 13 series · 35 of 48 positions shown · IV contrast (GADAVIST)
Comparison: None.

CLINICAL DATA: Recent fall.  Double vision today.

EXAM:
MRI HEAD AND ORBITS WITHOUT AND WITH CONTRAST
TECHNIQUE: Multiplanar, multiecho pulse sequences of the brain and surrounding
structures were obtained without and with intravenous contrast.
Multiplanar, multiecho pulse sequences of the orbits and surrounding
structures were obtained including fat saturation techniques, before
and after intravenous contrast administration.
CONTRAST:  7mL GADAVIST GADOBUTROL 1 MMOL/ML IV SOLN

[Series 12: T2 · sagittal · 5.0mm · 0.47mm/px · 2 of 24 slices shown (1 of 2)]
[im 1/24]
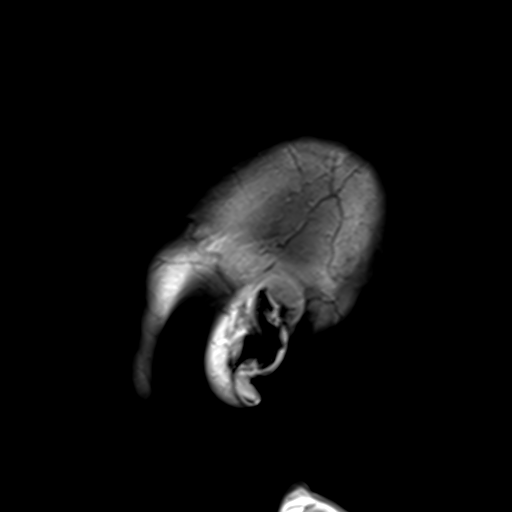
[im 24/24]
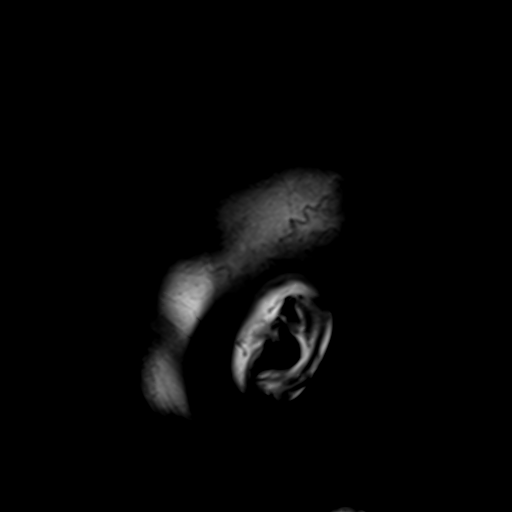

[Series 13: T2 · axial · 5.0mm · 0.45mm/px · z∈[-93,+67]mm · 2 of 26 slices shown (2 of 2)]
[im 1/26]
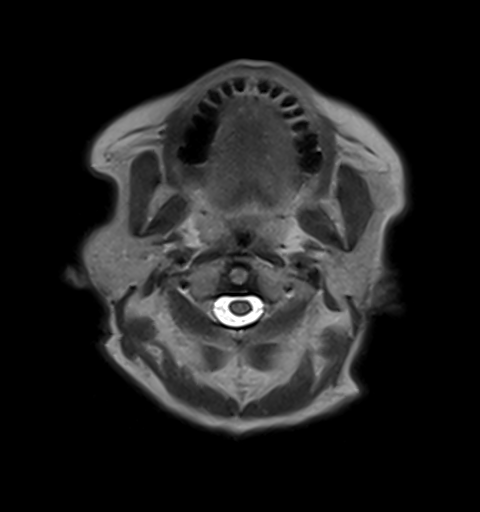
[im 26/26]
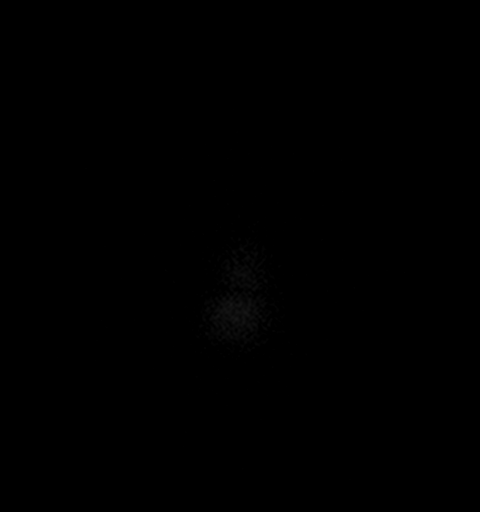

[Series 14: GRE · axial · 3.0mm · 0.45mm/px · z∈[-86,+61]mm · 4 of 51 slices shown]
[im 1/51]
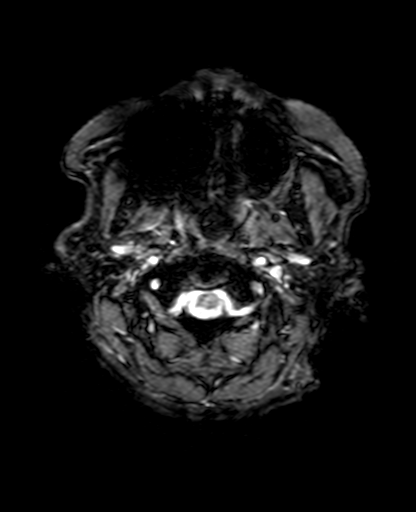
[im 17/51]
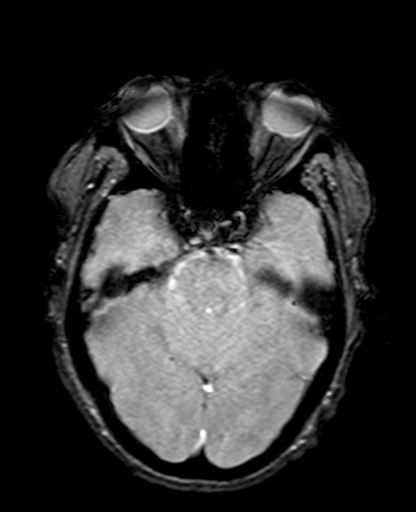
[im 34/51]
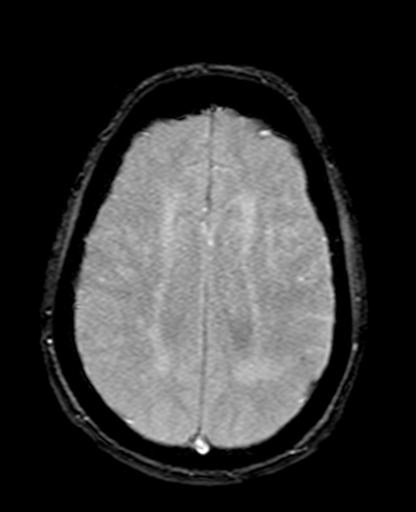
[im 51/51]
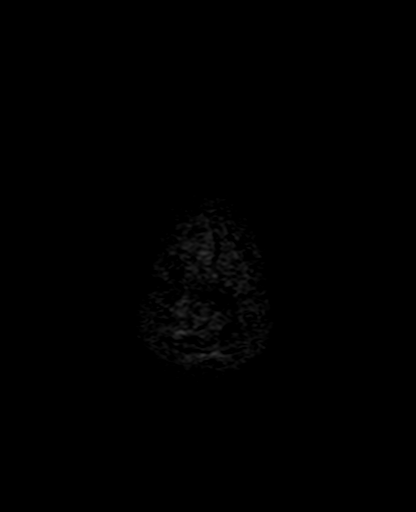

[Series 15: FLAIR · axial · 3.0mm · 0.86mm/px · z∈[-87,+60]mm · 4 of 51 slices shown]
[im 1/51]
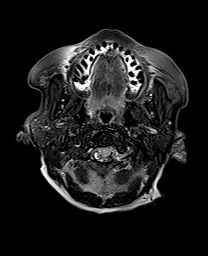
[im 17/51]
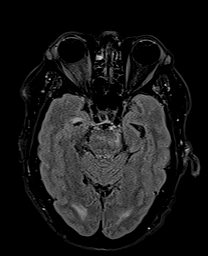
[im 34/51]
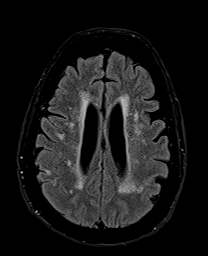
[im 51/51]
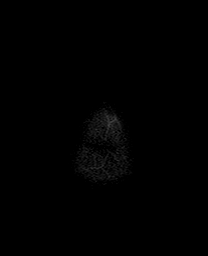

[Series 16: T1 · axial · 3.0mm · 0.45mm/px · z∈[-86,-39]mm · 2 of 51 slices shown]
[im 1/51]
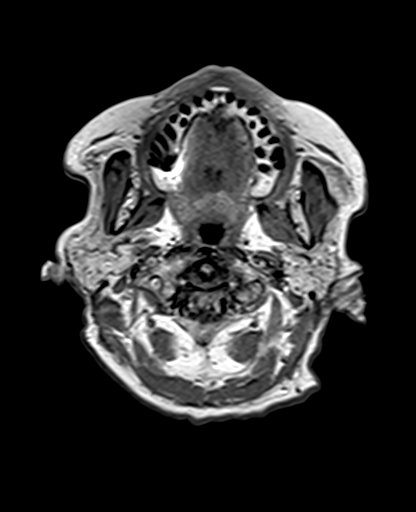
[im 17/51]
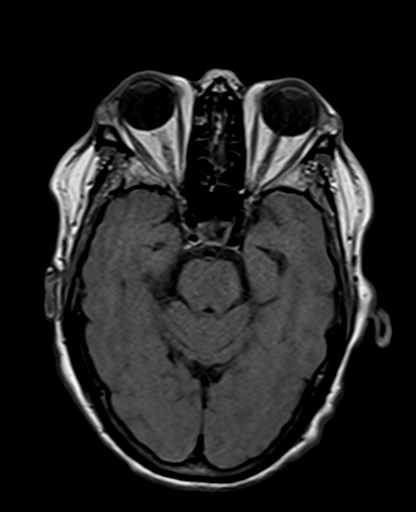

[Series 17: DWI · coronal · 5.0mm · 1.31mm/px · 5 of 64 slices shown (1 of 2)]
[im 1/64]
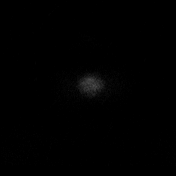
[im 16/64]
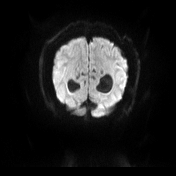
[im 32/64]
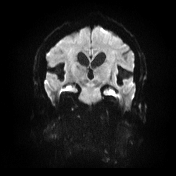
[im 48/64]
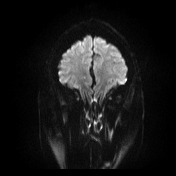
[im 64/64]
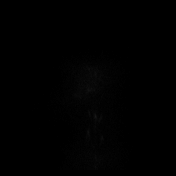

[Series 18: DWI · coronal · 5.0mm · 1.31mm/px · 3 of 31 slices shown (2 of 2)]
[im 1/31]
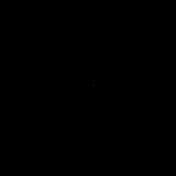
[im 16/31]
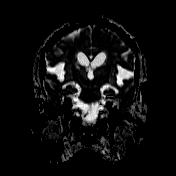
[im 31/31]
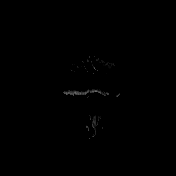

[Series 20: T2 post-contrast · coronal · 5.0mm · 0.86mm/px · 3 of 32 slices shown (1 of 2)]
[im 1/32]
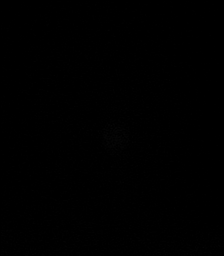
[im 16/32]
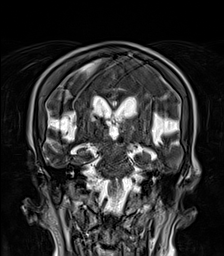
[im 32/32]
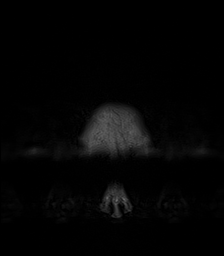

[Series 21: T1 post-contrast · axial · 3.0mm · 0.45mm/px · z∈[-80,+55]mm · 4 of 47 slices shown (1 of 2)]
[im 1/47]
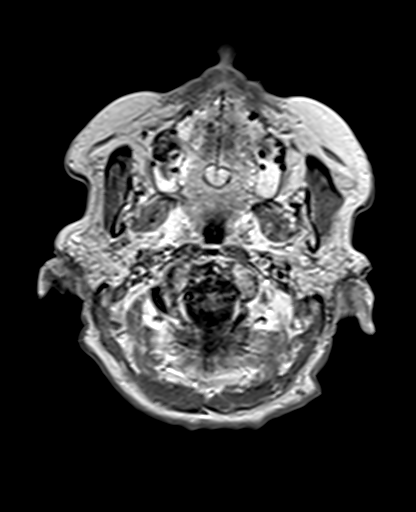
[im 16/47]
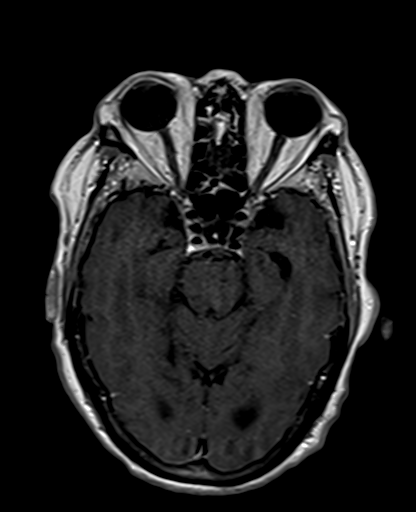
[im 31/47]
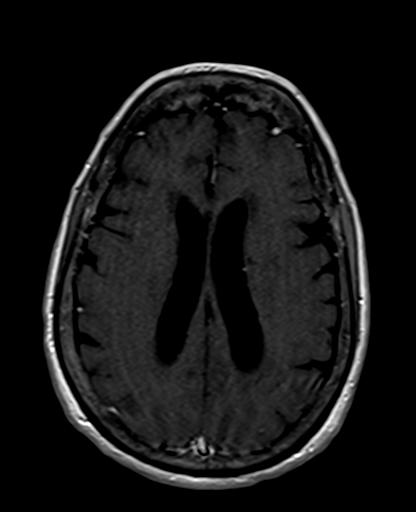
[im 47/47]
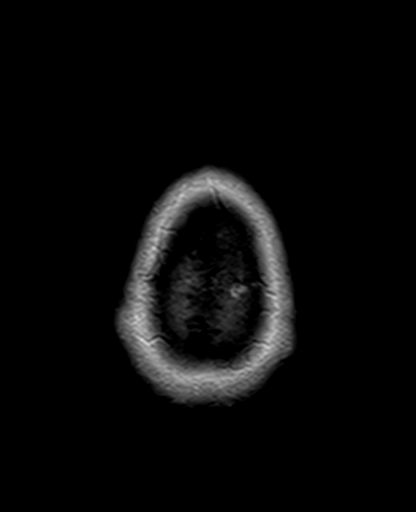

[Series 22: T2 post-contrast · coronal · 5.0mm · 0.86mm/px · 3 of 32 slices shown (2 of 2)]
[im 1/32]
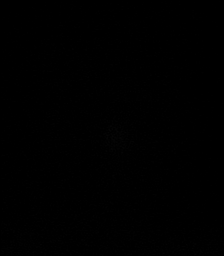
[im 16/32]
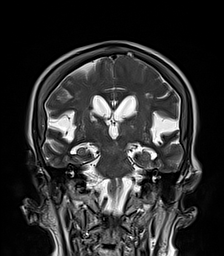
[im 32/32]
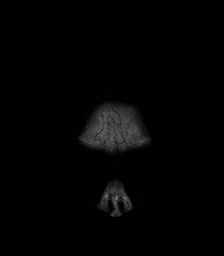

[Series 23: T1 post-contrast · coronal · 5.0mm · 0.43mm/px · 3 of 32 slices shown (2 of 2)]
[im 1/32]
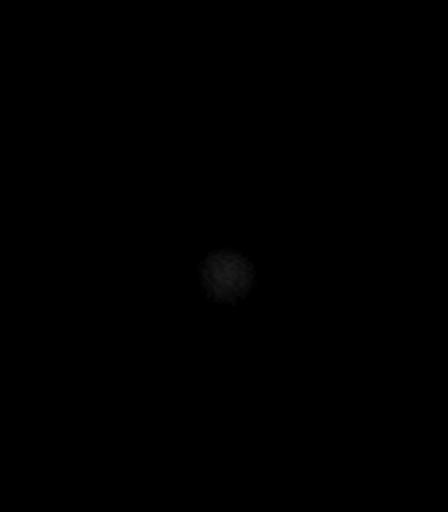
[im 16/32]
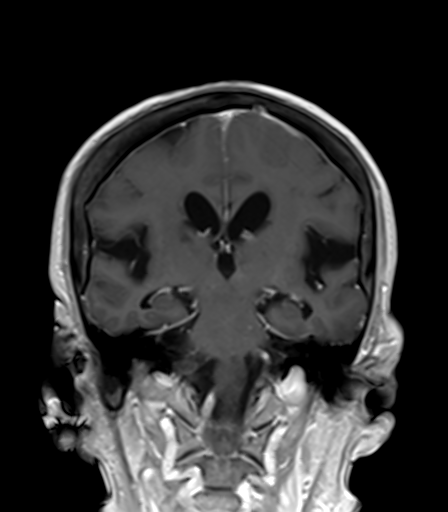
[im 32/32]
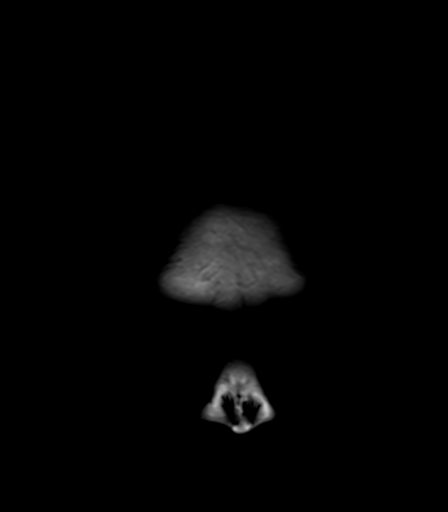

[35 of 48 positions shown; findings below may reference images not displayed]

FINDINGS: MRI HEAD FINDINGS

Brain: No acute infarction, hemorrhage, hydrocephalus, extra-axial
collection or mass lesion. Chronic small vessel ischemia with
confluent gliosis in the periventricular white matter. Chronic
Lacunes seen in the deep gray nuclei and brainstem. Remote
microhemorrhage at the right thalamus, often hypertensive in this
location.

Cerebral volume loss with a perisylvian predilection.
Ventriculomegaly is likely from atrophy, despite the degree of
disproportionate subarachnoid spaces. For normal pressure
hydrocephalus, would expect even greater ventriculomegaly.

Vascular: Normal flow voids and vascular enhancements

Skull and upper cervical spine: Normal marrow signal.

MRI ORBITS FINDINGS

Orbits: No traumatic or inflammatory finding. Globes, optic nerves,
orbital fat, extraocular muscles, vascular structures, and lacrimal
glands are normal.

Visualized sinuses: Clear

Soft tissues: Negative

Limited intracranial: Stippled areas postcontrast imaging of
enhancement in the brain on axial is most likely vascular structures
given the overall pattern. Subtle and more vague enhancement is seen
left of the cerebral aqua duct.
IMPRESSION: Brain MRI:

1. No acute infarct.
2. Moderate to extensive chronic small vessel ischemia.

Orbits:

1. On thin-section postcontrast images there are a few stippled
areas of enhancement seen at the brainstem and periaqueductal gray
matter. Suspect this is vascular enhancement, but if symptoms
persist and are unexplained recommend follow-up brain MRI with
contrast to ensure these are not related to a granulomatous or
lymphomatous process.
2. Negative orbits.  No mass, traumatic finding, or inflammation.

## 2020-01-02 IMAGING — MR MR ORBITS WO/W CM
5 series · 48 of 48 positions shown · IV contrast (gadavist)
Comparison: None.

CLINICAL DATA: Recent fall.  Double vision today.

EXAM:
MRI HEAD AND ORBITS WITHOUT AND WITH CONTRAST
TECHNIQUE: Multiplanar, multiecho pulse sequences of the brain and surrounding
structures were obtained without and with intravenous contrast.
Multiplanar, multiecho pulse sequences of the orbits and surrounding
structures were obtained including fat saturation techniques, before
and after intravenous contrast administration.
CONTRAST:  7mL GADAVIST GADOBUTROL 1 MMOL/ML IV SOLN

[Series 11: T1 · coronal · 3.0mm · 0.70mm/px · 12 of 32 slices shown (1 of 2)]
[im 1/32]
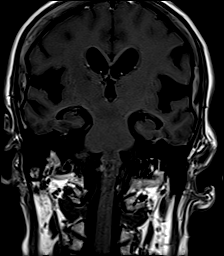
[im 3/32]
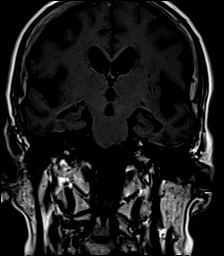
[im 6/32]
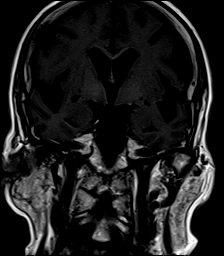
[im 9/32]
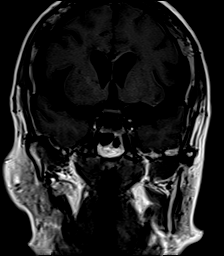
[im 12/32]
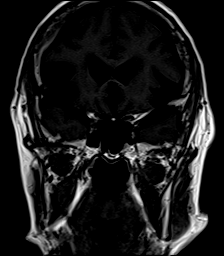
[im 15/32]
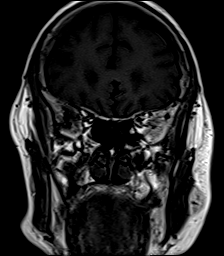
[im 17/32]
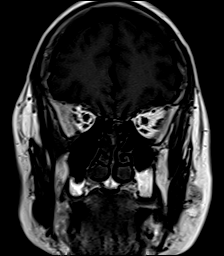
[im 20/32]
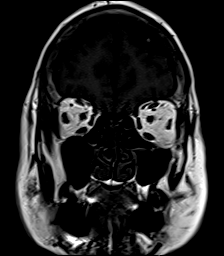
[im 23/32]
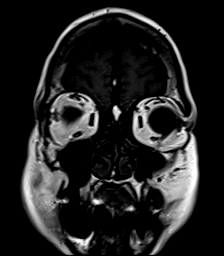
[im 26/32]
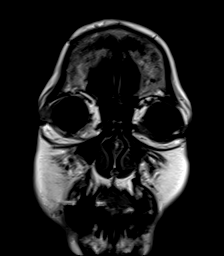
[im 29/32]
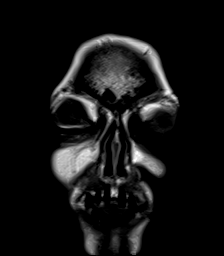
[im 32/32]
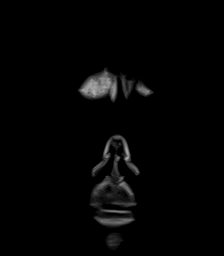

[Series 12: T1 · axial · 3.0mm · 0.56mm/px · z∈[-72,-11]mm · 7 of 20 slices shown (2 of 2)]
[im 1/20]
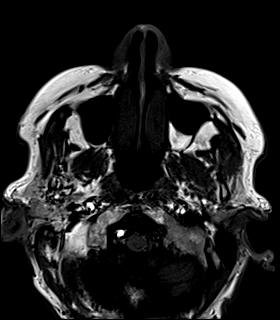
[im 4/20]
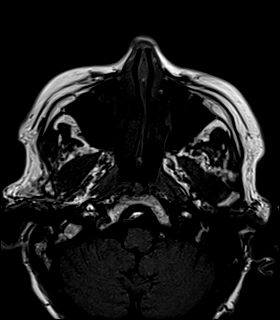
[im 7/20]
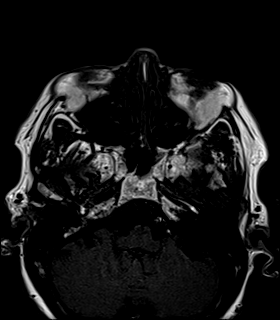
[im 10/20]
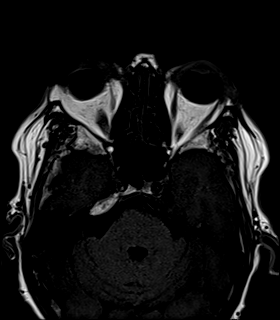
[im 13/20]
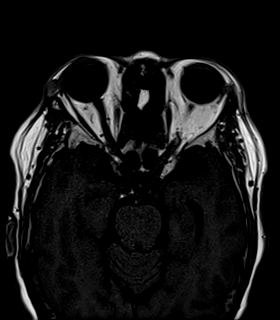
[im 16/20]
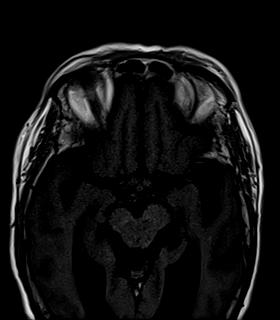
[im 20/20]
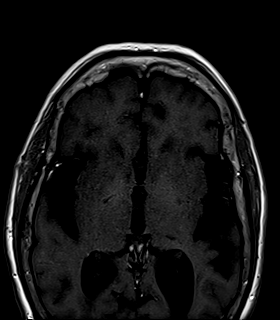

[Series 13: T2 fat-sat · coronal · 3.0mm · 0.47mm/px · 11 of 32 slices shown]
[im 1/32]
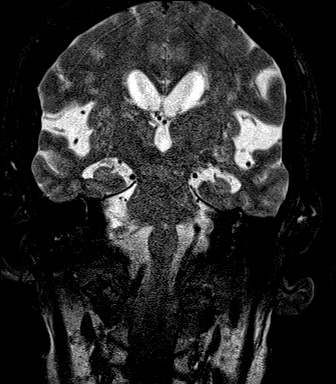
[im 4/32]
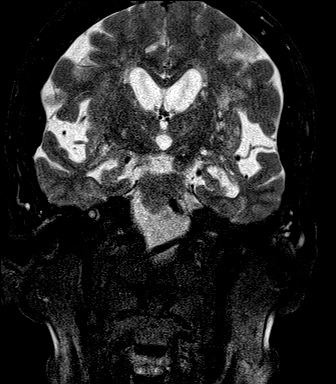
[im 7/32]
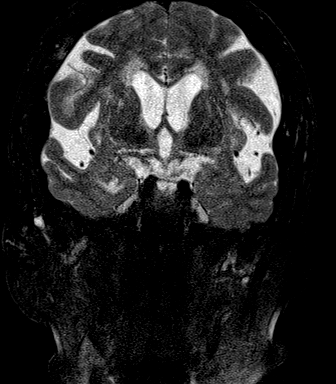
[im 10/32]
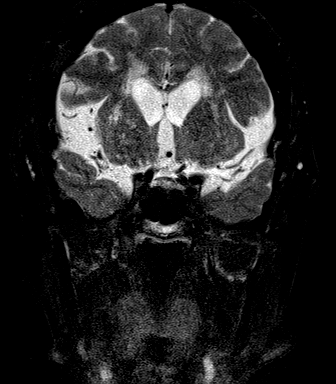
[im 13/32]
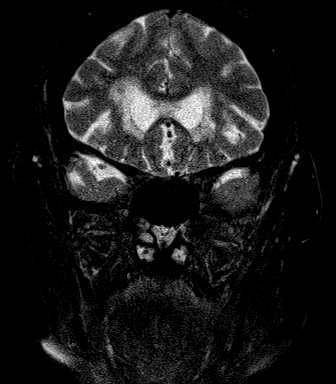
[im 16/32]
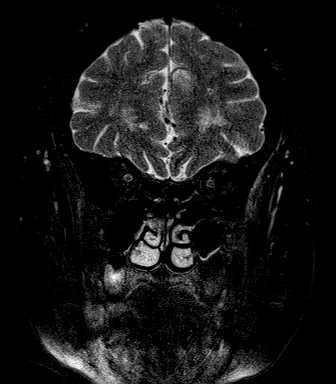
[im 19/32]
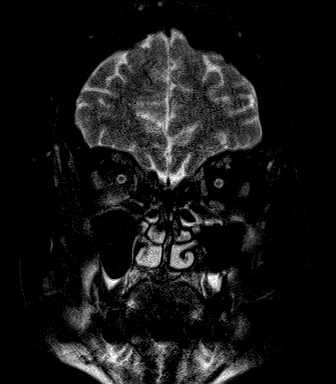
[im 22/32]
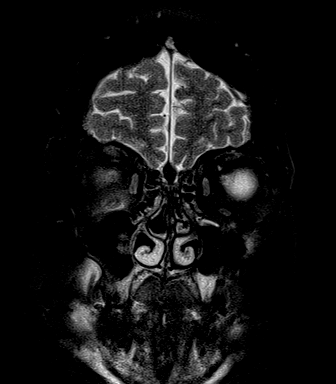
[im 25/32]
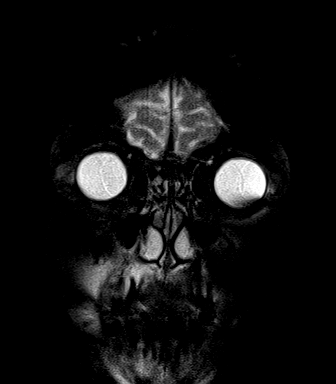
[im 28/32]
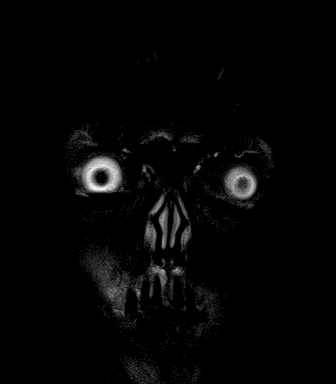
[im 32/32]
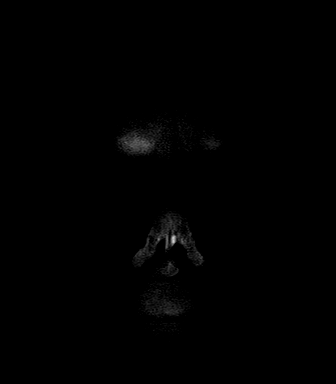

[Series 14: T1 fat-sat post-contrast · axial · 3.0mm · 0.70mm/px · z∈[-73,-12]mm · 7 of 20 slices shown (1 of 2)]
[im 1/20]
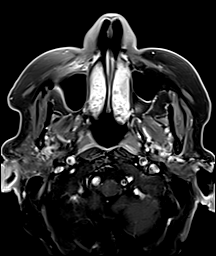
[im 4/20]
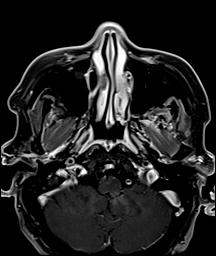
[im 7/20]
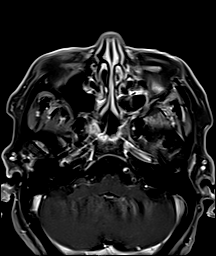
[im 10/20]
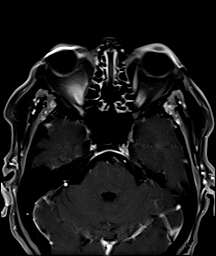
[im 13/20]
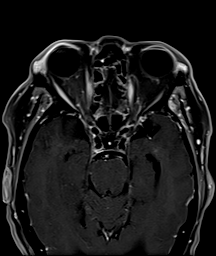
[im 16/20]
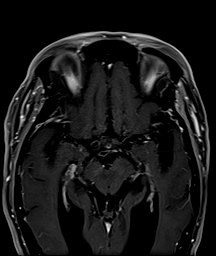
[im 20/20]
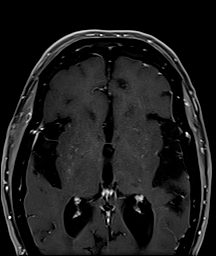

[Series 16: T1 fat-sat post-contrast · coronal · 3.0mm · 0.70mm/px · 11 of 31 slices shown (2 of 2)]
[im 1/31]
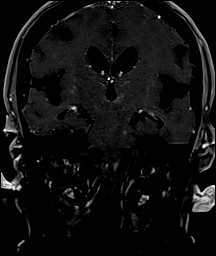
[im 4/31]
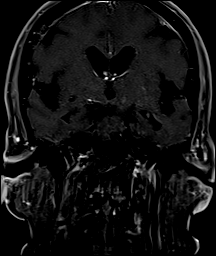
[im 7/31]
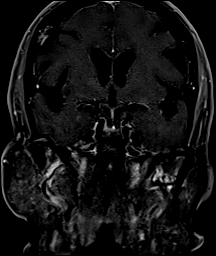
[im 10/31]
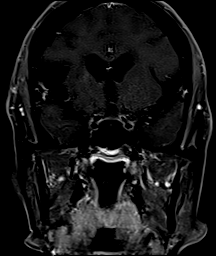
[im 13/31]
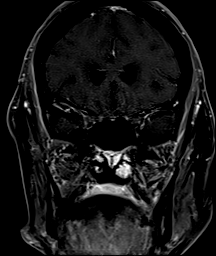
[im 16/31]
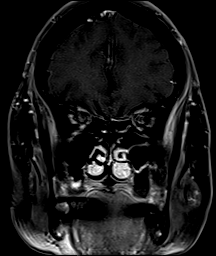
[im 19/31]
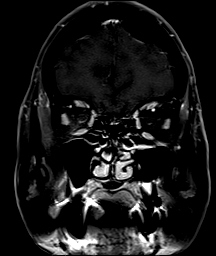
[im 22/31]
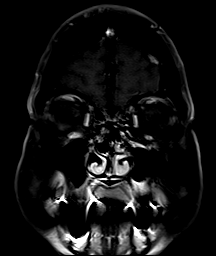
[im 25/31]
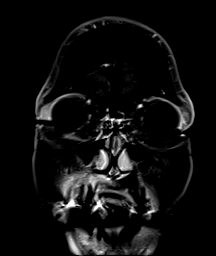
[im 28/31]
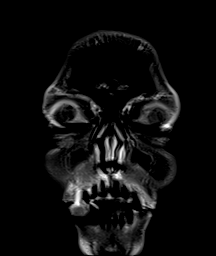
[im 31/31]
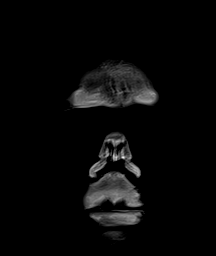

[48 of 48 positions shown; findings below may reference images not displayed]

FINDINGS: MRI HEAD FINDINGS

Brain: No acute infarction, hemorrhage, hydrocephalus, extra-axial
collection or mass lesion. Chronic small vessel ischemia with
confluent gliosis in the periventricular white matter. Chronic
Lacunes seen in the deep gray nuclei and brainstem. Remote
microhemorrhage at the right thalamus, often hypertensive in this
location.

Cerebral volume loss with a perisylvian predilection.
Ventriculomegaly is likely from atrophy, despite the degree of
disproportionate subarachnoid spaces. For normal pressure
hydrocephalus, would expect even greater ventriculomegaly.

Vascular: Normal flow voids and vascular enhancements

Skull and upper cervical spine: Normal marrow signal.

MRI ORBITS FINDINGS

Orbits: No traumatic or inflammatory finding. Globes, optic nerves,
orbital fat, extraocular muscles, vascular structures, and lacrimal
glands are normal.

Visualized sinuses: Clear

Soft tissues: Negative

Limited intracranial: Stippled areas postcontrast imaging of
enhancement in the brain on axial is most likely vascular structures
given the overall pattern. Subtle and more vague enhancement is seen
left of the cerebral aqua duct.
IMPRESSION: Brain MRI:

1. No acute infarct.
2. Moderate to extensive chronic small vessel ischemia.

Orbits:

1. On thin-section postcontrast images there are a few stippled
areas of enhancement seen at the brainstem and periaqueductal gray
matter. Suspect this is vascular enhancement, but if symptoms
persist and are unexplained recommend follow-up brain MRI with
contrast to ensure these are not related to a granulomatous or
lymphomatous process.
2. Negative orbits.  No mass, traumatic finding, or inflammation.

## 2020-01-02 MED ORDER — GADOBUTROL 1 MMOL/ML IV SOLN
7.0000 mL | Freq: Once | INTRAVENOUS | Status: AC | PRN
  Administered 2020-01-02: 18:00:00 7 mL via INTRAVENOUS

## 2020-01-02 NOTE — ED Notes (Signed)
Pt transported to MRI 

## 2020-01-02 NOTE — Discharge Instructions (Addendum)
Follow-up with ophthalmology. Your MRI did not show a stroke. There was a questionable abnormality though that was hard to distinguish between simply being blood vessels versus something else. There is nothing acute to about this but, if your symptoms persist, you need to have a repeat MRI with contrast. This is something your family doctor or ophthalmologist should be able to arrange for if needed.

## 2020-01-02 NOTE — ED Provider Notes (Addendum)
[facility]-EMERGENCY DEPT Provider Note   CSN: [id number] Arrival date & time: 01/02/20  1124     History Chief Complaint  Patient presents with  . Fall    Tasha [name] is a 74 y.o. female.  HPI   Patient presents to the emergency room for evaluation of blurred vision.  Patient states she had a fall 4 days ago.  After her fall she had difficulty getting up.  Patient ended up lying on the ground for 6 hours.  Patient states that her son came to check on her later in the day and eventually was able to get her up.  Patient has been able to get up and walk around using her cane since then.  She is having some pain in her right elbow but it just feels sore.  However the patient has noticed since that time she is having difficulty with her vision.  Her vision is very blurred when she has both of her eyes open.  It gets better when she has only 1 eye open.  She denies any trouble with her speech.  No trouble with her balance now.  No focal weakness or numbness.  Patient ended up calling an urgent care a couple days after the fall and they recommended she go to an emergency room to be evaluated.  That is why she came here today.  Past Medical History:  Diagnosis Date  . Hypertension   . MI (myocardial infarction) (HCC)   . Stroke [facility])     There are no problems to display for this patient.   Past Surgical History:  Procedure Laterality Date  . ABDOMINAL HYSTERECTOMY    . APPENDECTOMY    . BACK SURGERY       OB History   No obstetric history on file.     Family History  Adopted: Yes  Family history unknown: Yes    Social History   Tobacco Use  . Smoking status: Never Smoker  . Smokeless tobacco: Never Used  Substance Use Topics  . Alcohol use: Yes    Comment: occasionally  . Drug use: Never    Home Medications Prior to Admission medications   Medication Sig Start Date End Date Taking? Authorizing Provider  tetrahydrozoline 0.05 % ophthalmic  solution Place 1 drop into both eyes daily as needed (red eyes).   Yes [provider]  predniSONE (DELTASONE) 50 MG tablet Take 1 tablet (50 mg total) by mouth daily with breakfast. Patient not taking: Reported on 01/02/2020 06/29/19   Belinda Fisher, PA-C    Allergies    Allopurinol, Codeine, and Sulfa antibiotics  Review of Systems   Review of Systems  All other systems reviewed and are negative.   Physical Exam Updated Vital Signs BP (!) 215/98   Pulse 71   Temp 98.2 F (36.8 C) (Oral)   Resp 14   Ht 1.549 m (5\' 1" )   Wt 65.3 kg   SpO2 98%   BMI 27.21 kg/m   Physical Exam Vitals and nursing note reviewed.  Constitutional:      General: She is not in acute distress.    Appearance: She is well-developed.  HENT:     Head: Normocephalic and atraumatic.     Right Ear: External ear normal.     Left Ear: External ear normal.  Eyes:     General: No scleral icterus.       Right eye: No discharge.  Left eye: No discharge.     Conjunctiva/sclera: Conjunctivae normal.     Comments: Mild erythema right sclera, double vision with both eyes open  Neck:     Trachea: No tracheal deviation.  Cardiovascular:     Rate and Rhythm: Normal rate and regular rhythm.  Pulmonary:     Effort: Pulmonary effort is normal. No respiratory distress.     Breath sounds: Normal breath sounds. No stridor. No wheezing or rales.  Abdominal:     General: Bowel sounds are normal. There is no distension.     Palpations: Abdomen is soft.     Tenderness: There is no abdominal tenderness. There is no guarding or rebound.  Musculoskeletal:        General: No tenderness.     Cervical back: Neck supple.  Skin:    General: Skin is warm and dry.     Findings: No rash.  Neurological:     Mental Status: She is alert and oriented to person, place, and time.     Cranial Nerves: No cranial nerve deficit (No facial droop, extraocular movements intact, tongue midline ).     Sensory: No sensory  deficit.     Motor: No abnormal muscle tone or seizure activity.     Coordination: Coordination normal.     Comments: No pronator drift bilateral upper extrem, able to hold both legs off bed for 5 seconds, sensation intact in all extremities, no visual field cuts, no left or right sided neglect, no nystagmus noted      ED Results / Procedures / Treatments   Labs (all labs ordered are listed, but only abnormal results are displayed) Labs Reviewed  COMPREHENSIVE METABOLIC PANEL - Abnormal; Notable for the following components:      Result Value   Glucose, Bld 148 (*)    AST 12 (*)    GFR calc non Af Amer 57 (*)    All other components within normal limits  CK - Abnormal; Notable for the following components:   Total CK 36 (*)    All other components within normal limits  ETHANOL  PROTIME-INR  APTT  CBC  DIFFERENTIAL  RAPID URINE DRUG SCREEN, HOSP PERFORMED  URINALYSIS, ROUTINE W REFLEX MICROSCOPIC    EKG EKG Interpretation  Date/Time:  Friday Jan 02 2020 13:12:34 EDT Ventricular Rate:  74 PR Interval:    QRS Duration: 87 QT Interval:  508 QTC Calculation: 564 R Axis:   4 Text Interpretation: Sinus rhythm Borderline repolarization abnormality Prolonged QT interval No old tracing to compare Confirmed by Dorie Rank [phone]) on 01/02/2020 1:26:57 PM   Radiology DG Elbow Complete Right  Result Date: 01/02/2020 CLINICAL DATA:  Pain following fall EXAM: RIGHT ELBOW - COMPLETE 3+ VIEW COMPARISON:  None. FINDINGS: Frontal, lateral, and bilateral oblique views were obtained. There is no fracture or dislocation. No joint effusion. No joint space narrowing or erosion. There is a spur arising from the coracoid process of proximal ulna. IMPRESSION: Coracoid process proximal ulnar spur. No joint space narrowing. No fracture or dislocation. Electronically Signed   By: Lowella Grip III M.D.   On: 01/02/2020 13:43   CT HEAD WO CONTRAST  Result Date: 01/02/2020 CLINICAL DATA:  Recent  fall, blurred vision EXAM: CT HEAD WITHOUT CONTRAST TECHNIQUE: Contiguous axial images were obtained from the base of the skull through the vertex without intravenous contrast. COMPARISON:  None. FINDINGS: Brain: No evidence of acute infarction, hemorrhage, hydrocephalus, extra-axial collection or mass lesion/mass effect. Small lacunar infarcts  within the bilateral basal ganglia. Extensive low-density changes within the periventricular and subcortical white matter compatible with chronic microvascular ischemic change. Moderate diffuse cerebral volume loss. Vascular: Atherosclerotic calcifications involving the large vessels of the skull base. No unexpected hyperdense vessel. Skull: Normal. Negative for fracture or focal lesion. Sinuses/Orbits: No acute finding. Other: None. IMPRESSION: 1.  No acute intracranial findings. 2.  Chronic microvascular ischemic change and cerebral volume loss. Electronically Signed   By: Duanne Guess D.O.   On: 01/02/2020 14:41    Procedures Procedures (including critical care time)  Medications Ordered in ED Medications - No data to display  ED Course  I have reviewed the triage vital signs and the nursing notes.  Pertinent labs & imaging results that were available during my care of the patient were reviewed by me and considered in my medical decision making (see chart for details).  Clinical Course as of Jan 01 1717  Fri Jan 02, 2020  1305 HTN noted    [JK]  1408 CBC and metabolic panel normal   [JK]  9417 Elbow x-ray without fracture   [JK]  1446 CT scan does not show any acute findings.   [JK]  1717 HTN noted.   Will hold off on acute tx pending MRI results for cva   [JK]    Clinical Course User Index [JK] Linwood Dibbles, MD   MDM Rules/Calculators/A&P                      Pt presented with weakness, fatigue.  Labs unremarkable.  CT scan without acute findings.  Isolated eye complaints, binocular diplopia.  Will proceed with MRI to evaluate for  occult CVA, ocular event.  If negative, pt can follow up with ophthalmology.  Dr Juleen China will follow up on MRI Final Clinical Impression(s) / ED Diagnoses Final diagnoses:  Blurred vision    Rx / DC Orders ED Discharge Orders    None         Linwood Dibbles, MD 01/02/20 1718

## 2020-01-02 NOTE — ED Triage Notes (Signed)
Patient states she fell 4 days ago in her kitchen. Patient states she does not know if she passed out or not or had a head injury. Patient states she did not have her cell phone and laid on the floor x 6 hours. Today,  the patient c/o blurred vision and right elbow pain.

## 2020-02-15 ENCOUNTER — Inpatient Hospital Stay (HOSPITAL_COMMUNITY): Payer: Medicare Other

## 2020-02-15 ENCOUNTER — Encounter (HOSPITAL_COMMUNITY): Payer: Self-pay

## 2020-02-15 ENCOUNTER — Emergency Department (HOSPITAL_COMMUNITY): Payer: Medicare Other

## 2020-02-15 ENCOUNTER — Inpatient Hospital Stay (HOSPITAL_COMMUNITY)
Admission: AD | Admit: 2020-02-15 | Discharge: 2020-02-20 | DRG: 065 | Disposition: A | Discharge status: Skilled Nursing Facility | Payer: Medicare Other | Attending: Internal Medicine | Admitting: Internal Medicine

## 2020-02-15 DIAGNOSIS — Z7982 Long term (current) use of aspirin: Secondary | ICD-10-CM | POA: Diagnosis not present

## 2020-02-15 DIAGNOSIS — I6930 Unspecified sequelae of cerebral infarction: Secondary | ICD-10-CM | POA: Diagnosis present

## 2020-02-15 DIAGNOSIS — R29701 NIHSS score 1: Secondary | ICD-10-CM | POA: Diagnosis present

## 2020-02-15 DIAGNOSIS — I6381 Other cerebral infarction due to occlusion or stenosis of small artery: Secondary | ICD-10-CM | POA: Diagnosis present

## 2020-02-15 DIAGNOSIS — R569 Unspecified convulsions: Secondary | ICD-10-CM | POA: Diagnosis present

## 2020-02-15 DIAGNOSIS — E860 Dehydration: Secondary | ICD-10-CM | POA: Diagnosis present

## 2020-02-15 DIAGNOSIS — I639 Cerebral infarction, unspecified: Secondary | ICD-10-CM

## 2020-02-15 DIAGNOSIS — Z20822 Contact with and (suspected) exposure to covid-19: Secondary | ICD-10-CM | POA: Diagnosis present

## 2020-02-15 DIAGNOSIS — I10 Essential (primary) hypertension: Secondary | ICD-10-CM | POA: Diagnosis present

## 2020-02-15 DIAGNOSIS — R8271 Bacteriuria: Secondary | ICD-10-CM | POA: Diagnosis present

## 2020-02-15 DIAGNOSIS — E1151 Type 2 diabetes mellitus with diabetic peripheral angiopathy without gangrene: Secondary | ICD-10-CM | POA: Diagnosis present

## 2020-02-15 DIAGNOSIS — Z882 Allergy status to sulfonamides status: Secondary | ICD-10-CM

## 2020-02-15 DIAGNOSIS — R29810 Facial weakness: Secondary | ICD-10-CM | POA: Diagnosis present

## 2020-02-15 DIAGNOSIS — I252 Old myocardial infarction: Secondary | ICD-10-CM | POA: Diagnosis not present

## 2020-02-15 DIAGNOSIS — W1830XA Fall on same level, unspecified, initial encounter: Secondary | ICD-10-CM | POA: Diagnosis present

## 2020-02-15 DIAGNOSIS — Z8673 Personal history of transient ischemic attack (TIA), and cerebral infarction without residual deficits: Secondary | ICD-10-CM | POA: Diagnosis present

## 2020-02-15 DIAGNOSIS — Z888 Allergy status to other drugs, medicaments and biological substances status: Secondary | ICD-10-CM

## 2020-02-15 DIAGNOSIS — Z885 Allergy status to narcotic agent status: Secondary | ICD-10-CM

## 2020-02-15 DIAGNOSIS — Z7952 Long term (current) use of systemic steroids: Secondary | ICD-10-CM | POA: Diagnosis not present

## 2020-02-15 DIAGNOSIS — E785 Hyperlipidemia, unspecified: Secondary | ICD-10-CM | POA: Diagnosis present

## 2020-02-15 DIAGNOSIS — Z87820 Personal history of traumatic brain injury: Secondary | ICD-10-CM

## 2020-02-15 DIAGNOSIS — Z955 Presence of coronary angioplasty implant and graft: Secondary | ICD-10-CM

## 2020-02-15 DIAGNOSIS — N179 Acute kidney failure, unspecified: Secondary | ICD-10-CM | POA: Diagnosis present

## 2020-02-15 DIAGNOSIS — W19XXXA Unspecified fall, initial encounter: Secondary | ICD-10-CM | POA: Diagnosis present

## 2020-02-15 DIAGNOSIS — R531 Weakness: Secondary | ICD-10-CM | POA: Diagnosis present

## 2020-02-15 DIAGNOSIS — N390 Urinary tract infection, site not specified: Secondary | ICD-10-CM | POA: Diagnosis present

## 2020-02-15 LAB — URINALYSIS, ROUTINE W REFLEX MICROSCOPIC
Bilirubin Urine: NEGATIVE
Glucose, UA: 50 mg/dL — AB
Ketones, ur: 20 mg/dL — AB
Nitrite: NEGATIVE
Protein, ur: 100 mg/dL — AB
Specific Gravity, Urine: 1.023 (ref 1.005–1.030)
WBC, UA: >50 WBC/hpf — ABNORMAL HIGH (ref 0–5)
pH: 5 (ref 5.0–8.0)

## 2020-02-15 LAB — HEMOGLOBIN A1C
Hgb A1c MFr Bld: 6.9 % — ABNORMAL HIGH (ref 4.8–5.6)
Mean Plasma Glucose: 151.33 mg/dL

## 2020-02-15 LAB — COMPREHENSIVE METABOLIC PANEL
ALT: 18 U/L (ref 0–44)
AST: 16 U/L (ref 15–41)
Albumin: 3.8 g/dL (ref 3.5–5.0)
Alkaline Phosphatase: 51 U/L (ref 38–126)
Anion gap: 15 (ref 5–15)
BUN: 31 mg/dL — ABNORMAL HIGH (ref 8–23)
CO2: 22 mmol/L (ref 22–32)
Calcium: 9.7 mg/dL (ref 8.9–10.3)
Chloride: 105 mmol/L (ref 98–111)
Creatinine, Ser: 1.14 mg/dL — ABNORMAL HIGH (ref 0.44–1.00)
GFR calc Af Amer: 55 mL/min — ABNORMAL LOW (ref >60)
GFR calc non Af Amer: 48 mL/min — ABNORMAL LOW (ref >60)
Glucose, Bld: 173 mg/dL — ABNORMAL HIGH (ref 70–99)
Potassium: 3.5 mmol/L (ref 3.5–5.1)
Sodium: 142 mmol/L (ref 135–145)
Total Bilirubin: 1.3 mg/dL — ABNORMAL HIGH (ref 0.3–1.2)
Total Protein: 6.9 g/dL (ref 6.5–8.1)

## 2020-02-15 LAB — CBC WITH DIFFERENTIAL/PLATELET
Abs Immature Granulocytes: 0.04 10*3/uL (ref 0.00–0.07)
Basophils Absolute: 0.1 10*3/uL (ref 0.0–0.1)
Basophils Relative: 1 %
Eosinophils Absolute: 0.3 10*3/uL (ref 0.0–0.5)
Eosinophils Relative: 3 %
HCT: 40.9 % (ref 36.0–46.0)
Hemoglobin: 13.3 g/dL (ref 12.0–15.0)
Immature Granulocytes: 0 %
Lymphocytes Relative: 12 %
Lymphs Abs: 1.1 10*3/uL (ref 0.7–4.0)
MCH: 28.4 pg (ref 26.0–34.0)
MCHC: 32.5 g/dL (ref 30.0–36.0)
MCV: 87.2 fL (ref 80.0–100.0)
Monocytes Absolute: 0.7 10*3/uL (ref 0.1–1.0)
Monocytes Relative: 8 %
Neutro Abs: 7 10*3/uL (ref 1.7–7.7)
Neutrophils Relative %: 76 %
Platelets: 269 10*3/uL (ref 150–400)
RBC: 4.69 MIL/uL (ref 3.87–5.11)
RDW: 13.2 % (ref 11.5–15.5)
WBC: 9.1 10*3/uL (ref 4.0–10.5)
nRBC: 0 % (ref 0.0–0.2)

## 2020-02-15 LAB — SARS CORONAVIRUS 2 BY RT PCR (HOSPITAL ORDER, PERFORMED IN ~~LOC~~ HOSPITAL LAB): SARS Coronavirus 2: NEGATIVE

## 2020-02-15 LAB — TSH: TSH: 2.065 u[IU]/mL (ref 0.350–4.500)

## 2020-02-15 LAB — CK: Total CK: 64 U/L (ref 38–234)

## 2020-02-15 IMAGING — MR MR HEAD WO/W CM
12 of 14 series · 40 of 48 positions shown · IV contrast (Gadavist)
Comparison: Comparison made with prior head CT from earlier the
same day as well as recent brain MRI from [DATE].

CLINICAL DATA: Initial evaluation for acute encephalopathy.

EXAM:
MRI HEAD WITHOUT AND WITH CONTRAST
TECHNIQUE: Multiplanar, multiecho pulse sequences of the brain and surrounding
structures were obtained without and with intravenous contrast.
CONTRAST:  6mL GADAVIST GADOBUTROL 1 MMOL/ML IV SOLN

[Series 5: DWI · axial · 3.0mm · 0.88mm/px · z∈[-41,+107]mm · 7 of 104 slices shown (1 of 4)]
[im 1/104]
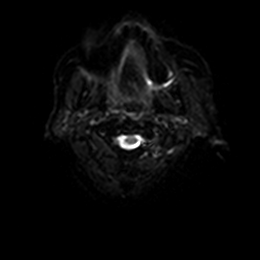
[im 18/104]
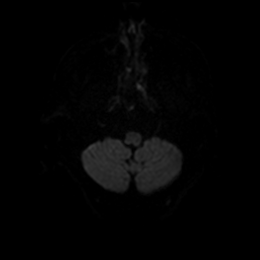
[im 35/104]
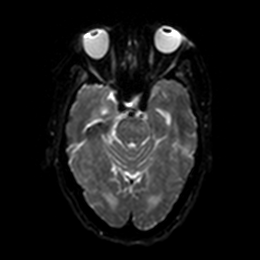
[im 52/104]
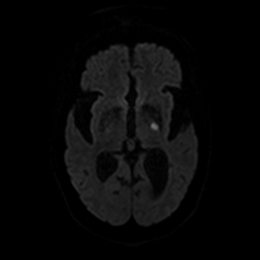
[im 69/104]
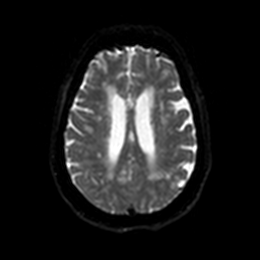
[im 86/104]
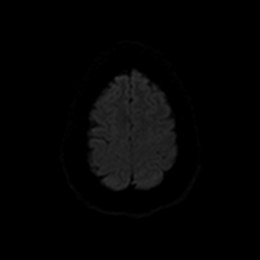
[im 104/104]
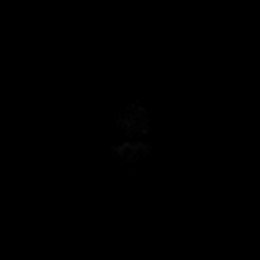

[Series 6: DWI · axial · 3.0mm · 0.88mm/px · z∈[-41,+107]mm · 4 of 52 slices shown (2 of 4)]
[im 1/52]
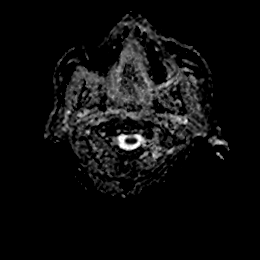
[im 18/52]
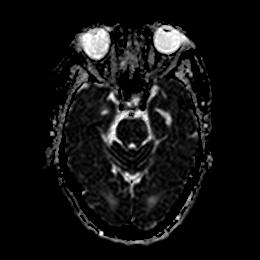
[im 35/52]
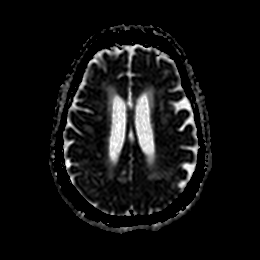
[im 52/52]
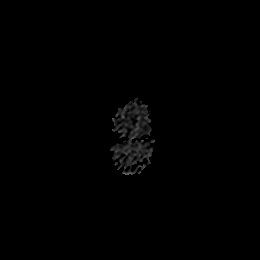

[Series 7: DWI · coronal · 4.0mm · 0.88mm/px · 6 of 72 slices shown (3 of 4)]
[im 1/72]
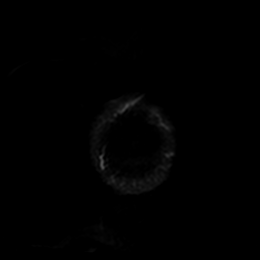
[im 15/72]
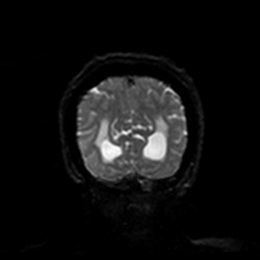
[im 29/72]
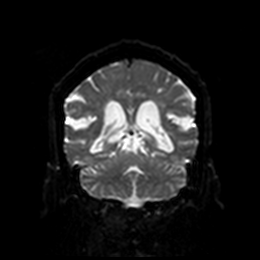
[im 43/72]
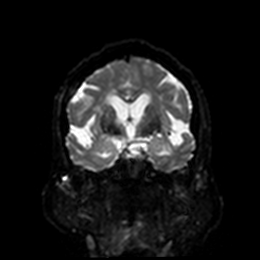
[im 57/72]
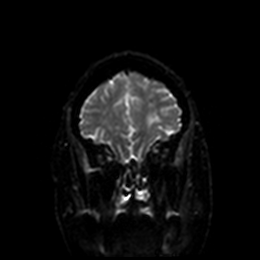
[im 72/72]
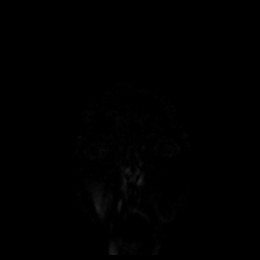

[Series 8: DWI · coronal · 4.0mm · 0.88mm/px · 3 of 36 slices shown (4 of 4)]
[im 1/36]
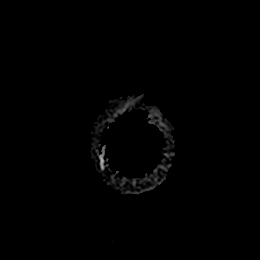
[im 18/36]
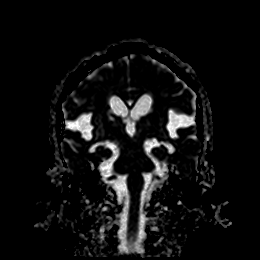
[im 36/36]
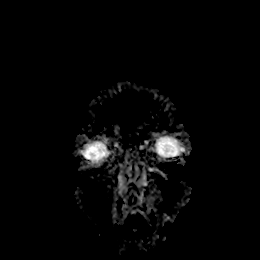

[Series 9: FLAIR · axial · 5.0mm · 0.45mm/px · z∈[-35,+104]mm · 2 of 25 slices shown]
[im 1/25]
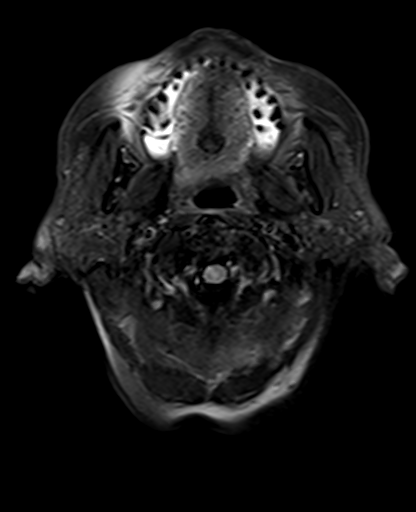
[im 25/25]
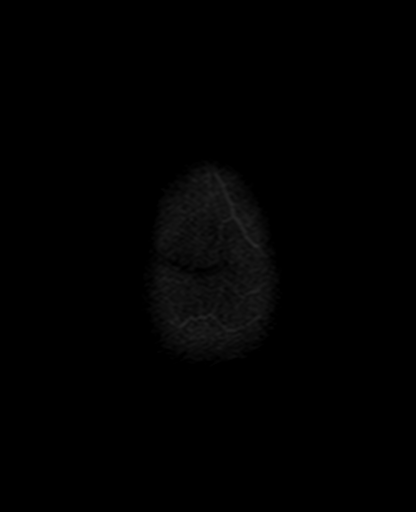

[Series 11: pha_images · axial · 3.0mm · 0.90mm/px · z∈[-41,+107]mm · 4 of 51 slices shown]
[im 1/51]
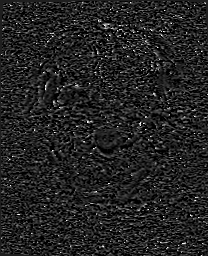
[im 17/51]
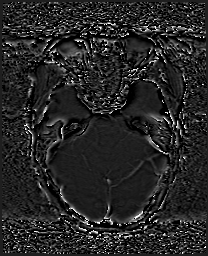
[im 34/51]
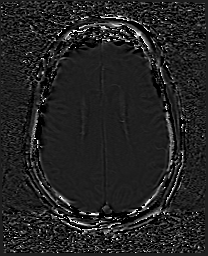
[im 51/51]
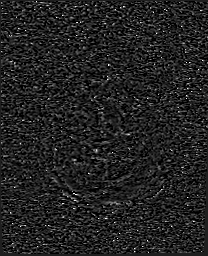

[Series 12: swi_images · axial · 3.0mm · 0.90mm/px · z∈[-41,+107]mm · 4 of 52 slices shown]
[im 1/52]
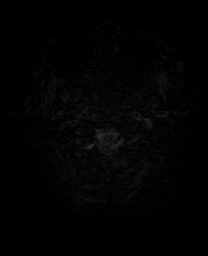
[im 18/52]
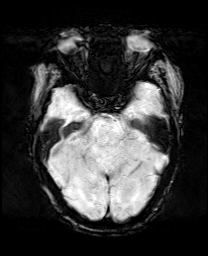
[im 35/52]
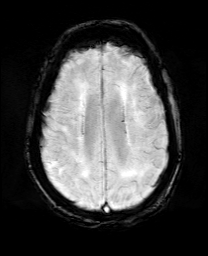
[im 52/52]
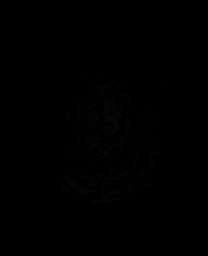

[Series 14: T1 · sagittal · 5.0mm · 0.75mm/px · 2 of 23 slices shown]
[im 1/23]
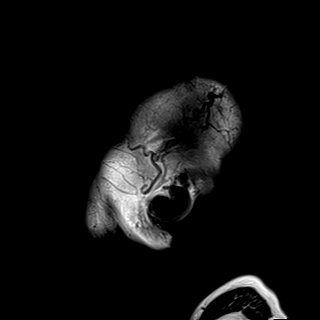
[im 23/23]
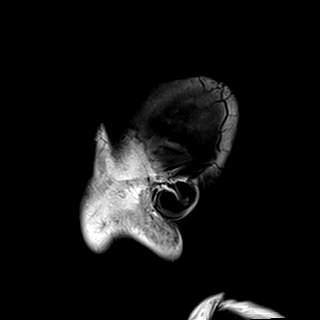

[Series 15: T2 · axial · 5.0mm · 0.72mm/px · z∈[-32,+108]mm · 2 of 25 slices shown]
[im 1/25]
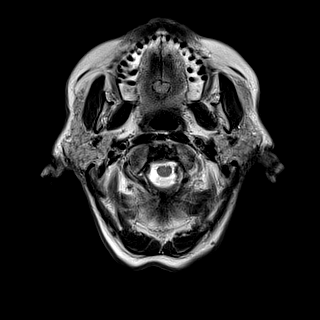
[im 25/25]
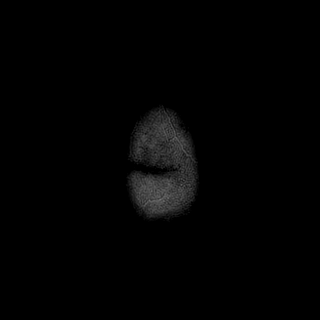

[Series 17: T2 post-contrast · coronal · 5.0mm · 0.72mm/px · 2 of 30 slices shown]
[im 1/30]
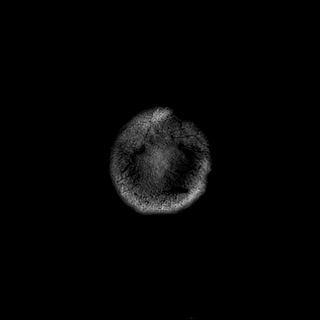
[im 30/30]
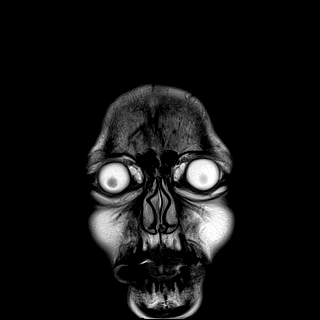

[Series 19: T1 post-contrast · coronal · 5.0mm · 0.34mm/px · 2 of 30 slices shown (1 of 2)]
[im 1/30]
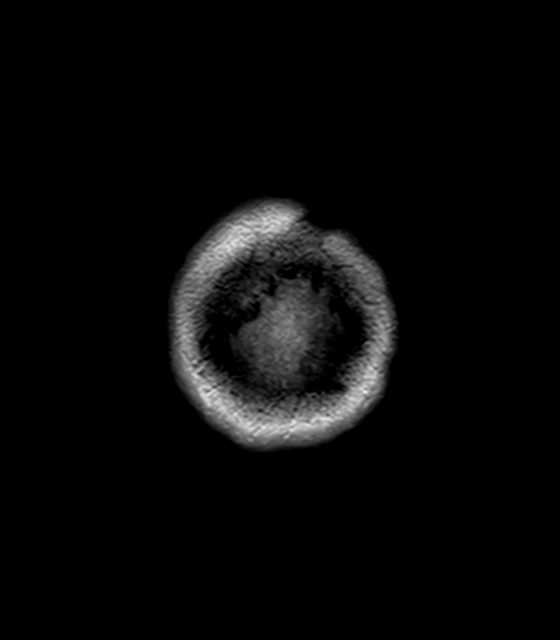
[im 30/30]
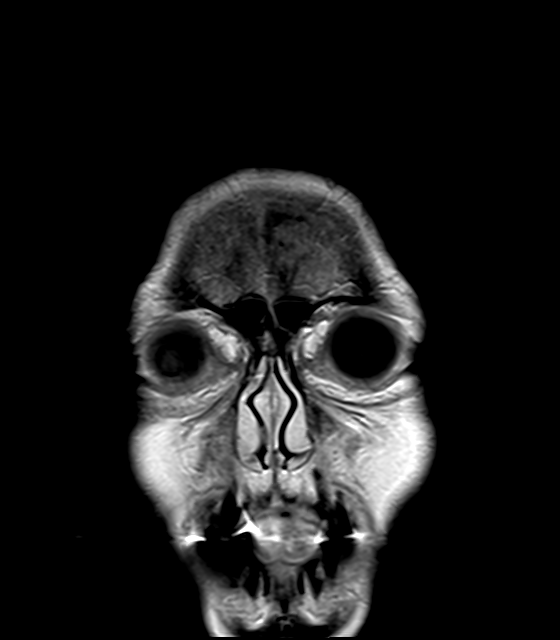

[Series 20: T1 post-contrast · sagittal · 5.0mm · 0.72mm/px · 2 of 23 slices shown (2 of 2)]
[im 1/23]
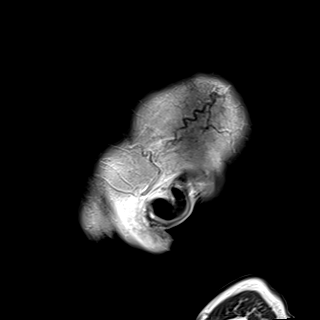
[im 23/23]
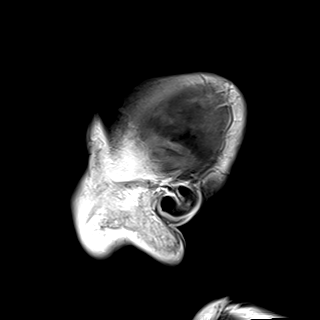

[40 of 48 positions shown; findings below may reference images not displayed]

FINDINGS: Brain: Diffuse prominence of the CSF containing spaces compatible
with generalized age-related cerebral atrophy. Moderate to advanced
chronic microvascular ischemic disease again noted involving the
periventricular and deep white matter both cerebral hemispheres,
with patchy involvement of the pons and brainstem. Multiple
scattered superimposed remote lacunar infarcts present about the
bilateral basal ganglia and thalami.

8 mm focus of restricted diffusion seen centered at the posterior
limb of the left internal capsule (series 5, image 78) consistent
with an acute ischemic infarct. There is an additional 5 mm focus of
vague diffusion abnormality involving the subcortical left parietal
lobe without ADC correlate, likely an evolving subacute ischemic
infarct (series 5, image 87). No associated hemorrhage or mass
effect about these ischemic changes. No evidence for acute
intracranial hemorrhage. Few scattered chronic micro hemorrhages
noted involving the right cerebellum, right thalamus, and left
periatrial white matter, most likely related to underlying
hypertension/small vessel disease.

Single 7 mm focus of curvilinear enhancement seen involving the left
parietal gray matter (series 18, image 37), new from recent brain
MRI, but favored to be related to an evolving subacute ischemic
infarct given the close proximity to the above-mentioned mild
diffusion abnormality. No other foci of abnormal enhancement.
Previously described stippled enhancement seen involving the
brainstem and periaqueductal gray matter is now perhaps only faintly
visualized, decreased in prominence from prior exam. Finding felt to
be have been related to a subacute ischemic lacunar type infarct,
now more chronic and evident on today's exam (series 15, image 12 on
T2 weighted sequence). No other abnormal enhancement.

No mass lesion, midline shift, or mass effect. Diffuse ventricular
prominence related to global parenchymal volume loss without
hydrocephalus. No extra-axial fluid collection. Partially empty
sella noted. Midline structures intact.

Vascular: Major intracranial vascular flow voids are maintained.

Skull and upper cervical spine: Craniocervical junction within
normal limits. Bone marrow signal intensity normal. No scalp soft
tissue abnormality.

Sinuses/Orbits: Globes and orbital soft tissues within normal
limits. Mild scattered mucosal thickening noted within the ethmoidal
air cells and maxillary sinuses. Trace right mastoid effusion noted,
of doubtful significance. Inner ear structures grossly normal.

Other: None.
IMPRESSION: 1. 8 mm acute ischemic nonhemorrhagic infarct involving the
posterior limb of the left internal capsule.
2. Additional 5 mm focus of vague diffusion abnormality involving
the subcortical left parietal lobe, likely an evolving subacute
ischemic infarct.
3. 7 mm focus of curvilinear enhancement involving the left parietal
cortex, new as compared to recent brain MRI, and favored to be
secondary to evolving subacute ischemia given the close proximity to
the above mention diffusion abnormality. Short interval follow-up
MRI in 3 months is suggested to ensure these changes resolve.
4. Near complete interval resolution of previously seen stippled
enhancement involving the periaqueductal gray matter/brainstem.
Finding felt to have been related to an evolving subacute ischemic
infarct on prior exam, now more chronic and evident on today's exam.
5. Underlying age-related cerebral atrophy with fairly advanced
chronic microvascular ischemic disease.

## 2020-02-15 IMAGING — CT CT HEAD W/O CM
4 series · 16 of 47 positions shown, 18 images · non-contrast
Comparison: [DATE]

CLINICAL DATA: Subacute neuro deficit. Dizziness. Fall a few days
ago.

EXAM:
CT HEAD WITHOUT CONTRAST
TECHNIQUE: Contiguous axial images were obtained from the base of the skull
through the vertex without intravenous contrast.

[Series 3: head without · axial · non-contrast · 0.43mm/px · z∈[-165,-45]mm · 7 of 33 slices shown, 9 images]
[im 5/33  brain]
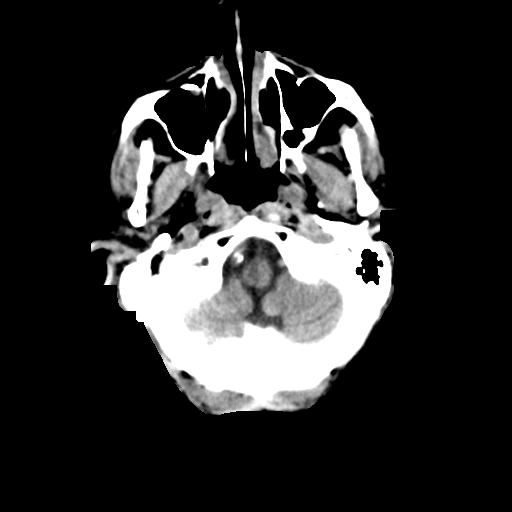
[im 5/33  bone]
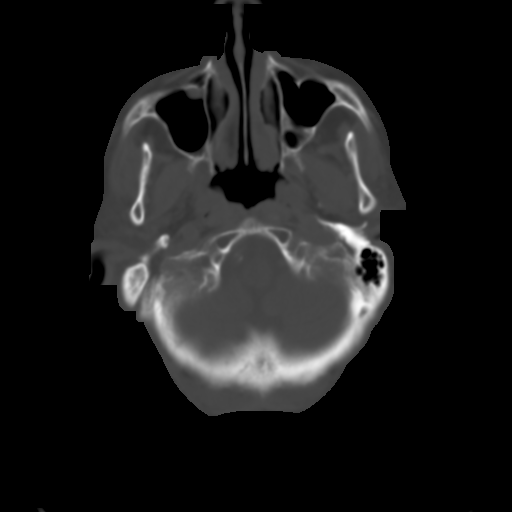
[im 9/33  brain]
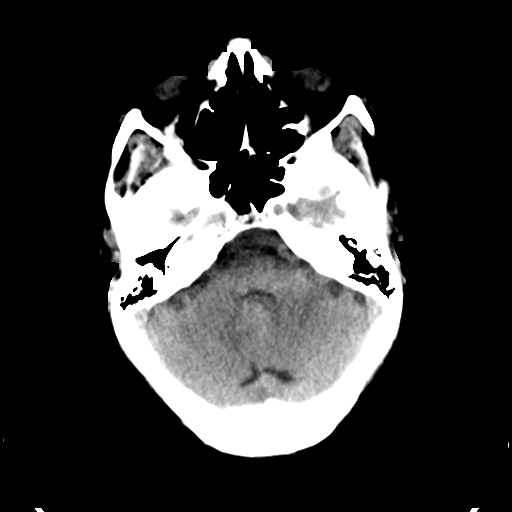
[im 13/33  brain]
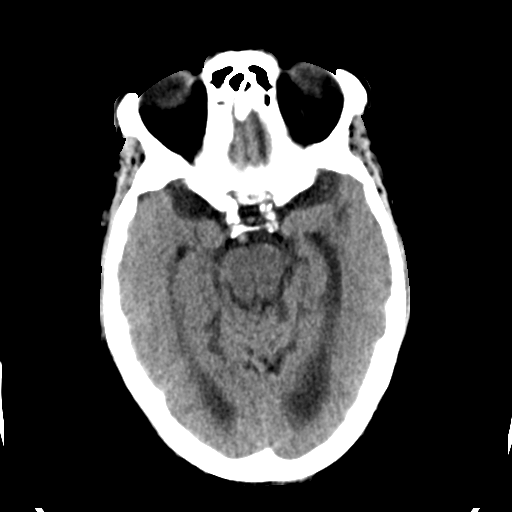
[im 17/33  brain]
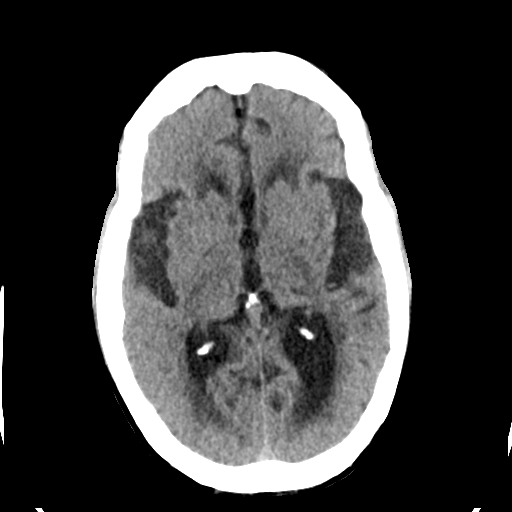
[im 21/33  brain]
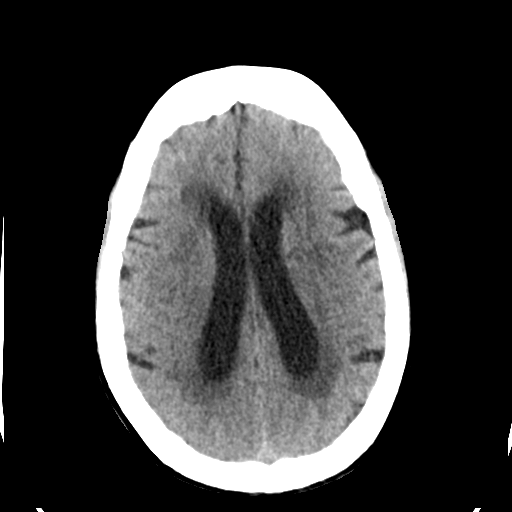
[im 21/33  bone]
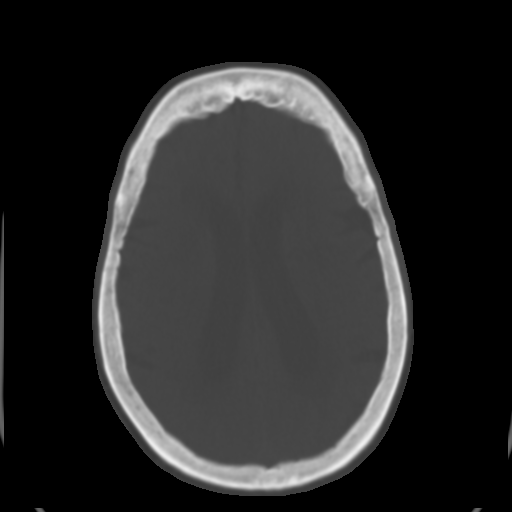
[im 25/33  brain]
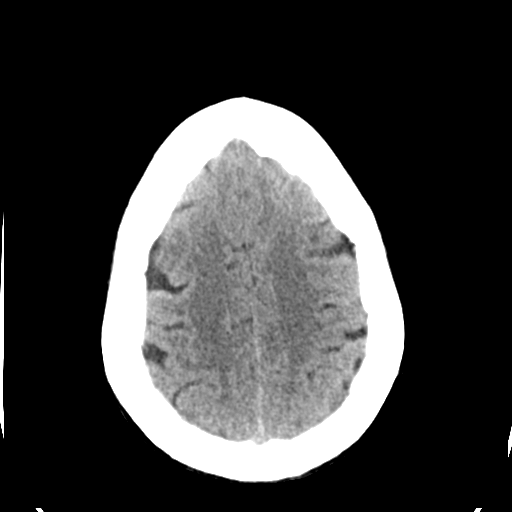
[im 29/33  brain]
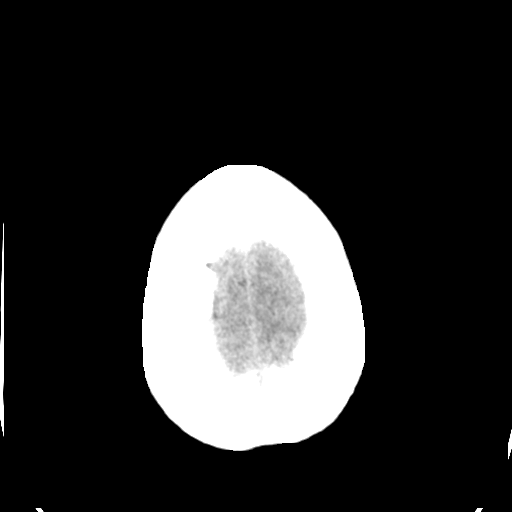

[Series 4: head bone · axial · 0.43mm/px · z∈[-169,-137]mm · 3 of 82 slices shown]
[im 9/82  bone]
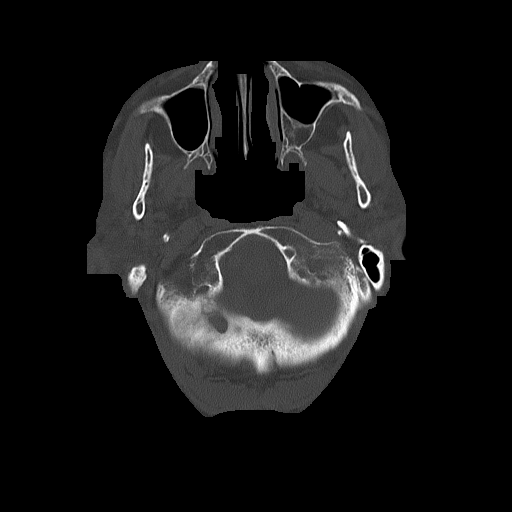
[im 17/82  bone]
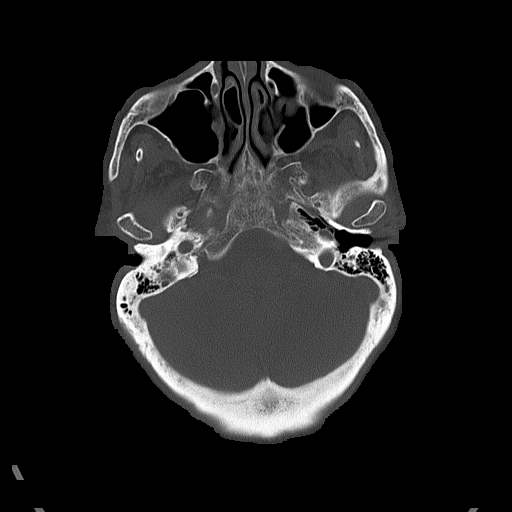
[im 25/82  bone]
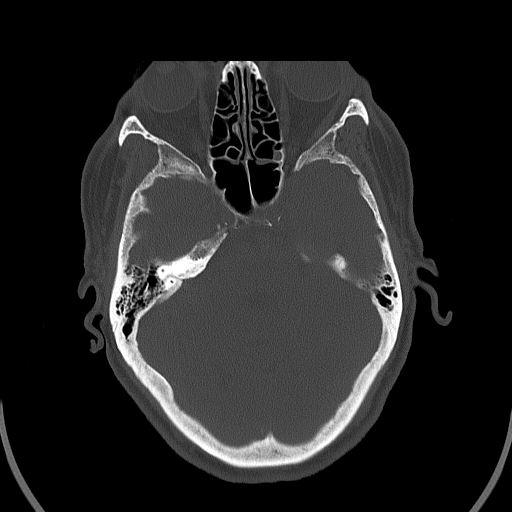

[Series 5: head without cor · coronal · non-contrast · 0.32mm/px · 3 of 67 slices shown]
[im 23/67  brain]
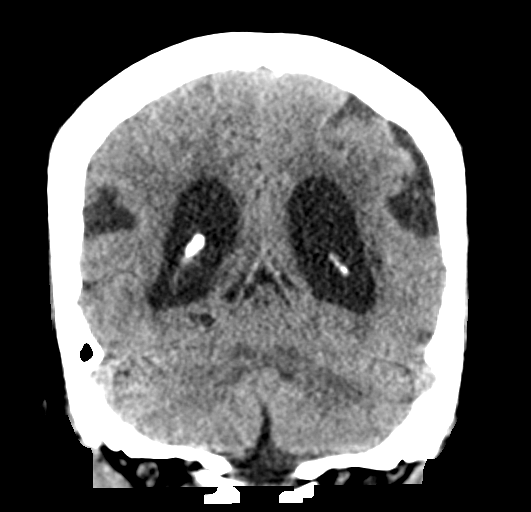
[im 30/67  brain]
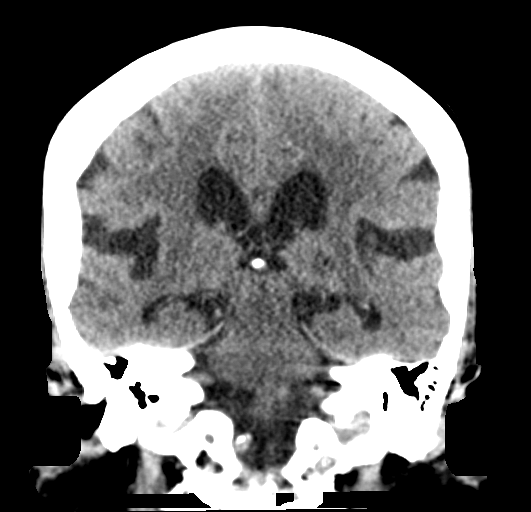
[im 37/67  brain]
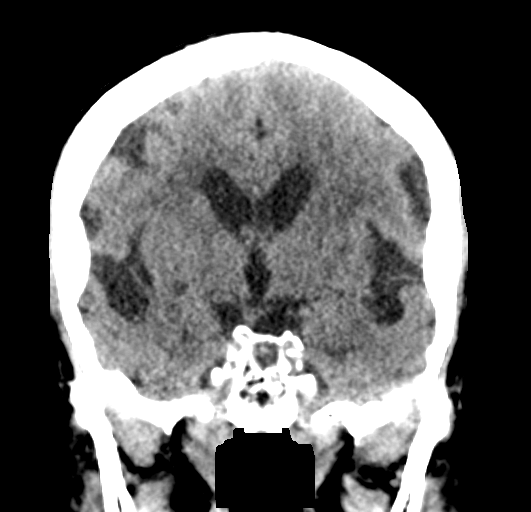

[Series 6: head without sag · sagittal · non-contrast · 0.32mm/px · 3 of 61 slices shown]
[im 21/61  brain]
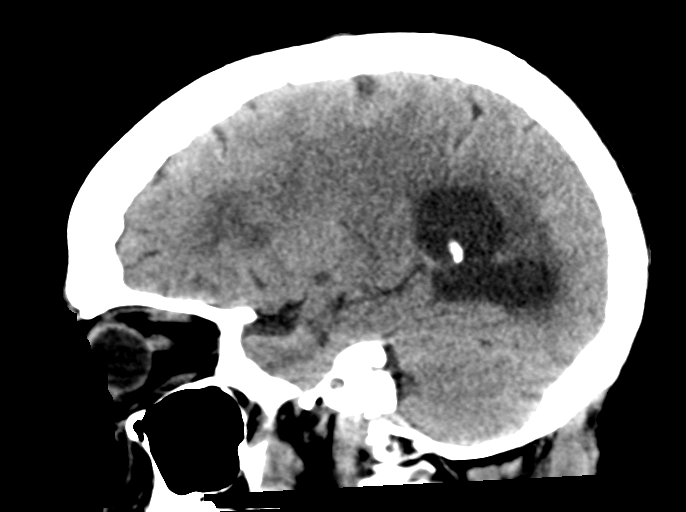
[im 31/61  brain]
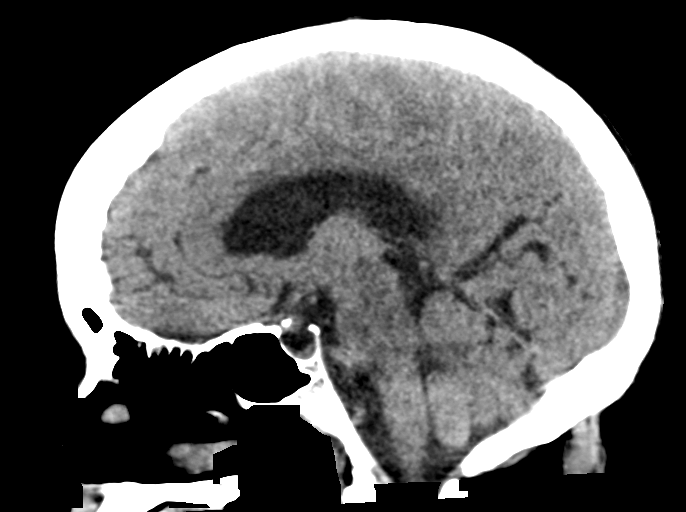
[im 41/61  brain]
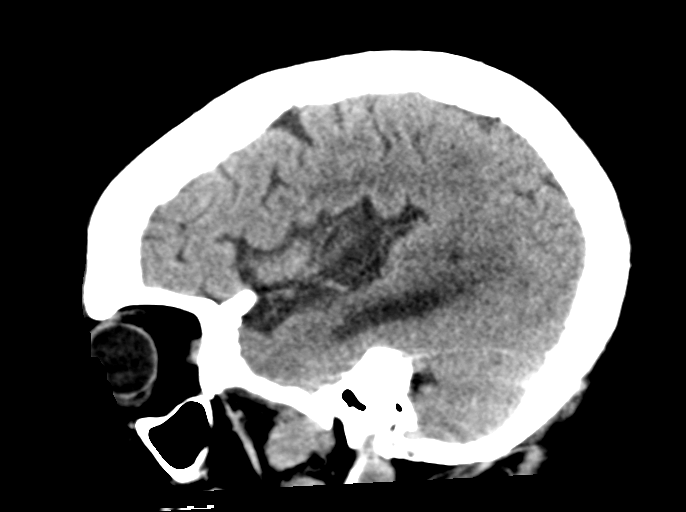

[16 of 47 positions shown; findings below may reference images not displayed]

FINDINGS: Brain: No subdural, epidural, or subarachnoid hemorrhage. Ventricles
and sulci are prominent but stable. Moderate white matter changes
are stable. Scattered lacunar infarcts are stable. No acute cortical
ischemia or infarct. No mass effect or midline shift. A focus of
high attenuation in the right cerebellum on coronal image 47
correlates with a region of susceptibility artifact on an MRI from
[DATE] and likely represents either calcification or nonacute
blood. This does not appear to be an acute finding based on the
comparison MRI.

Vascular: Calcified atherosclerosis in the intracranial carotids.

Skull: Normal. Negative for fracture or focal lesion.

Sinuses/Orbits: No acute finding.

Other: None.
IMPRESSION: No acute intracranial abnormalities. Chronic white matter changes
and lacunar infarcts.

## 2020-02-15 MED ORDER — SIMVASTATIN 20 MG PO TABS: 20.0000 mg | ORAL_TABLET | Freq: Every day | ORAL | Status: DC

## 2020-02-15 MED ORDER — ACETAMINOPHEN 160 MG/5ML PO SOLN: 650.0000 mg | ORAL | Status: DC | PRN

## 2020-02-15 MED ORDER — SODIUM CHLORIDE 0.9 % IV BOLUS
1000.0000 mL | Freq: Once | INTRAVENOUS | Status: AC
  Administered 2020-02-15: 1000 mL via INTRAVENOUS

## 2020-02-15 MED ORDER — ASPIRIN EC 81 MG PO TBEC: 81.0000 mg | DELAYED_RELEASE_TABLET | Freq: Every day | ORAL | Status: DC

## 2020-02-15 MED ORDER — STROKE: EARLY STAGES OF RECOVERY BOOK
Freq: Once | Status: AC
  Filled 2020-02-15: qty 1

## 2020-02-15 MED ORDER — ASPIRIN EC 81 MG PO TBEC
81.0000 mg | DELAYED_RELEASE_TABLET | Freq: Every day | ORAL | Status: DC
  Administered 2020-02-16 – 2020-02-19 (×3): 81 mg via ORAL
  Filled 2020-02-15 (×3): qty 1

## 2020-02-15 MED ORDER — SODIUM CHLORIDE 0.9 % IV SOLN
1.0000 g | INTRAVENOUS | Status: DC
  Administered 2020-02-15: 1 g via INTRAVENOUS
  Filled 2020-02-15: qty 10

## 2020-02-15 MED ORDER — SODIUM CHLORIDE 0.9 % IV SOLN: INTRAVENOUS | Status: DC

## 2020-02-15 MED ORDER — GADOBUTROL 1 MMOL/ML IV SOLN
6.0000 mL | Freq: Once | INTRAVENOUS | Status: AC | PRN
  Administered 2020-02-15: 6 mL via INTRAVENOUS

## 2020-02-15 MED ORDER — ACETAMINOPHEN 325 MG PO TABS: 650.0000 mg | ORAL_TABLET | ORAL | Status: DC | PRN

## 2020-02-15 MED ORDER — LABETALOL HCL 5 MG/ML IV SOLN
5.0000 mg | INTRAVENOUS | Status: DC | PRN
  Administered 2020-02-15 – 2020-02-17 (×4): 5 mg via INTRAVENOUS
  Filled 2020-02-15 (×4): qty 4

## 2020-02-15 MED ORDER — ACETAMINOPHEN 650 MG RE SUPP: 650.0000 mg | RECTAL | Status: DC | PRN

## 2020-02-15 MED ORDER — SENNOSIDES-DOCUSATE SODIUM 8.6-50 MG PO TABS: 1.0000 | ORAL_TABLET | Freq: Every evening | ORAL | Status: DC | PRN

## 2020-02-15 MED ORDER — ENOXAPARIN SODIUM 40 MG/0.4ML ~~LOC~~ SOLN
40.0000 mg | SUBCUTANEOUS | Status: DC
  Administered 2020-02-16 – 2020-02-19 (×5): 40 mg via SUBCUTANEOUS
  Filled 2020-02-15 (×5): qty 0.4

## 2020-02-15 MED ORDER — ASPIRIN 325 MG PO TABS
325.0000 mg | ORAL_TABLET | Freq: Every day | ORAL | Status: AC
  Administered 2020-02-15: 325 mg via ORAL
  Filled 2020-02-15: qty 1

## 2020-02-15 NOTE — ED Notes (Signed)
   02/15/20 1811  Provider Notification  Provider Name/Title Dr. Freida Busman  Date Provider Notified 02/15/20  Time Provider Notified [phone]  Notification Type  (message)  Notification Reason Other (Comment) (BP still high 201/94)

## 2020-02-15 NOTE — ED Provider Notes (Signed)
[facility] EMERGENCY DEPARTMENT Provider Note   CSN: [id number] Arrival date & time: 02/15/20  1600     History Chief Complaint  Patient presents with  . Fall  . Weakness    Tasha [name] is a 74 y.o. female.  74 year old female presents after being found on the ground at home for several days.  States that she has been generally weak and dizzy for several days.  States that she tried to get out of her bed on Wednesday and felt so weak that she fell to the floor.  Denied any loss of consciousness.  Denies any hip or back discomfort.  No lower extremity discomfort.  No headache.  Patient has not been able to take her medications.  Her family finally did a well check in found her and she was transported here for further management.  She denies any fever, emesis.  No black or bloody stools.        Past Medical History:  Diagnosis Date  . Hypertension   . MI (myocardial infarction) (HCC)   . Stroke [facility])     There are no problems to display for this patient.   Past Surgical History:  Procedure Laterality Date  . ABDOMINAL HYSTERECTOMY    . APPENDECTOMY    . BACK SURGERY       OB History   No obstetric history on file.     Family History  Adopted: Yes  Family history unknown: Yes    Social History   Tobacco Use  . Smoking status: Never Smoker  . Smokeless tobacco: Never Used  Vaping Use  . Vaping Use: Never used  Substance Use Topics  . Alcohol use: Yes    Comment: occasionally  . Drug use: Never    Home Medications Prior to Admission medications   Medication Sig Start Date End Date Taking? Authorizing Provider  predniSONE (DELTASONE) 50 MG tablet Take 1 tablet (50 mg total) by mouth daily with breakfast. Patient not taking: Reported on 01/02/2020 06/29/19   Belinda Fisher, PA-C  tetrahydrozoline 0.05 % ophthalmic solution Place 1 drop into both eyes daily as needed (red eyes).    [provider]    Allergies    Allopurinol,  Codeine, and Sulfa antibiotics  Review of Systems   Review of Systems  All other systems reviewed and are negative.   Physical Exam Updated Vital Signs Pulse 87   Temp 98.3 F (36.8 C) (Oral)   Resp 12   SpO2 98%   Physical Exam Vitals and nursing note reviewed.  Constitutional:      General: She is not in acute distress.    Appearance: Normal appearance. She is well-developed. She is not toxic-appearing.  HENT:     Head: Normocephalic and atraumatic.  Eyes:     General: Lids are normal.     Conjunctiva/sclera: Conjunctivae normal.     Pupils: Pupils are equal, round, and reactive to light.  Neck:     Thyroid: No thyroid mass.     Trachea: No tracheal deviation.  Cardiovascular:     Rate and Rhythm: Normal rate and regular rhythm.     Heart sounds: Normal heart sounds. No murmur heard.  No gallop.   Pulmonary:     Effort: Pulmonary effort is normal. No respiratory distress.     Breath sounds: Normal breath sounds. No stridor. No decreased breath sounds, wheezing, rhonchi or rales.  Abdominal:     General: Bowel sounds are normal. There is  no distension.     Palpations: Abdomen is soft.     Tenderness: There is no abdominal tenderness. There is no rebound.  Musculoskeletal:        General: No tenderness. Normal range of motion.     Cervical back: Normal range of motion and neck supple.     Comments: No shortening or rotation of her lower extremities.  Not tender in her hips.  Nontender along her cervical thoracic and lumbar spine  Skin:    General: Skin is warm and dry.     Findings: No abrasion or rash.  Neurological:     General: No focal deficit present.     Mental Status: She is alert and oriented to person, place, and time.     GCS: GCS eye subscore is 4. GCS verbal subscore is 5. GCS motor subscore is 6.     Cranial Nerves: No cranial nerve deficit.     Sensory: No sensory deficit.     Coordination: Finger-Nose-Finger Test normal.  Psychiatric:         Attention and Perception: Attention normal.        Mood and Affect: Affect is flat.        Speech: Speech normal.        Behavior: Behavior is withdrawn.     ED Results / Procedures / Treatments   Labs (all labs ordered are listed, but only abnormal results are displayed) Labs Reviewed  URINE CULTURE  SARS CORONAVIRUS 2 BY RT PCR (HOSPITAL ORDER, [place] [facility])  CBC WITH DIFFERENTIAL/PLATELET  COMPREHENSIVE METABOLIC PANEL  URINALYSIS, ROUTINE W REFLEX MICROSCOPIC  CK    EKG EKG Interpretation  Date/Time:  Sunday February 15 2020 16:43:00 EDT Ventricular Rate:  83 PR Interval:    QRS Duration: 72 QT Interval:  396 QTC Calculation: 466 R Axis:   11 Text Interpretation: Sinus rhythm Low voltage, precordial leads Borderline repolarization abnormality No significant change since last tracing Confirmed by Lacretia Leigh ([id number]) on 02/15/2020 5:07:18 PM   Radiology No results found.  Procedures Procedures (including critical care time)  Medications Ordered in ED Medications  0.9 %  sodium chloride infusion (has no administration in time range)  sodium chloride 0.9 % bolus 1,000 mL (1,000 mLs Intravenous New Bag/Given 02/15/20 1701)    ED Course  I have reviewed the triage vital signs and the nursing notes.  Pertinent labs & imaging results that were available during my care of the patient were reviewed by me and considered in my medical decision making (see chart for details).    MDM Rules/Calculators/A&P                          Patient with evidence of UTI and started on IV antibiotics.  Also given IV hydration and she does have a little bit of acute kidney injury.  Likely dehydrated from being on the floor.  Total CK was without signs of rhabdo.  Head CT without acute findings.  Will admit to the hospital service Final Clinical Impression(s) / ED Diagnoses Final diagnoses:  None    Rx / DC Orders ED Discharge Orders    None       Lacretia Leigh, MD 02/15/20 1807
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
## 2020-02-15 NOTE — ED Triage Notes (Signed)
Pt BIB [facility] from home where she lives alone. Pt had generalized weakness, dizziness since Tuesday, fell Wednesday, was down since, found by family today. LSN Tuesday. Pt hypertensive 244/120 per EMS, VS stable otherwise, R sided HA, A&Ox4.
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
## 2020-02-16 ENCOUNTER — Inpatient Hospital Stay (HOSPITAL_COMMUNITY): Payer: Medicare Other

## 2020-02-16 ENCOUNTER — Other Ambulatory Visit: Payer: Self-pay

## 2020-02-16 DIAGNOSIS — R29810 Facial weakness: Secondary | ICD-10-CM

## 2020-02-16 DIAGNOSIS — I10 Essential (primary) hypertension: Secondary | ICD-10-CM

## 2020-02-16 DIAGNOSIS — E119 Type 2 diabetes mellitus without complications: Secondary | ICD-10-CM

## 2020-02-16 DIAGNOSIS — G8311 Monoplegia of lower limb affecting right dominant side: Secondary | ICD-10-CM

## 2020-02-16 DIAGNOSIS — I6389 Other cerebral infarction: Secondary | ICD-10-CM

## 2020-02-16 DIAGNOSIS — G8321 Monoplegia of upper limb affecting right dominant side: Secondary | ICD-10-CM

## 2020-02-16 LAB — CBC
HCT: 38.3 % (ref 36.0–46.0)
Hemoglobin: 12.5 g/dL (ref 12.0–15.0)
MCH: 28.6 pg (ref 26.0–34.0)
MCHC: 32.6 g/dL (ref 30.0–36.0)
MCV: 87.6 fL (ref 80.0–100.0)
Platelets: 268 10*3/uL (ref 150–400)
RBC: 4.37 MIL/uL (ref 3.87–5.11)
RDW: 13.3 % (ref 11.5–15.5)
WBC: 9.2 10*3/uL (ref 4.0–10.5)
nRBC: 0 % (ref 0.0–0.2)

## 2020-02-16 LAB — URINE CULTURE: Culture: >=100000 — AB

## 2020-02-16 LAB — LIPID PANEL
Cholesterol: 223 mg/dL — ABNORMAL HIGH (ref 0–200)
HDL: 49 mg/dL (ref >40)
LDL Cholesterol: 143 mg/dL — ABNORMAL HIGH (ref 0–99)
Total CHOL/HDL Ratio: 4.6 RATIO
Triglycerides: 157 mg/dL — ABNORMAL HIGH (ref <150)
VLDL: 31 mg/dL (ref 0–40)

## 2020-02-16 LAB — GLUCOSE, CAPILLARY
Glucose-Capillary: 129 mg/dL — ABNORMAL HIGH (ref 70–99)
Glucose-Capillary: 187 mg/dL — ABNORMAL HIGH (ref 70–99)

## 2020-02-16 LAB — CBG MONITORING, ED
Glucose-Capillary: 111 mg/dL — ABNORMAL HIGH (ref 70–99)
Glucose-Capillary: 208 mg/dL — ABNORMAL HIGH (ref 70–99)
Glucose-Capillary: 117 mg/dL — ABNORMAL HIGH (ref 70–99)

## 2020-02-16 LAB — BASIC METABOLIC PANEL
Anion gap: 10 (ref 5–15)
BUN: 25 mg/dL — ABNORMAL HIGH (ref 8–23)
CO2: 23 mmol/L (ref 22–32)
Calcium: 9.3 mg/dL (ref 8.9–10.3)
Chloride: 107 mmol/L (ref 98–111)
Creatinine, Ser: 0.99 mg/dL (ref 0.44–1.00)
GFR calc Af Amer: >60 mL/min (ref >60)
GFR calc non Af Amer: 57 mL/min — ABNORMAL LOW (ref >60)
Glucose, Bld: 177 mg/dL — ABNORMAL HIGH (ref 70–99)
Potassium: 3 mmol/L — ABNORMAL LOW (ref 3.5–5.1)
Sodium: 140 mmol/L (ref 135–145)

## 2020-02-16 LAB — ECHOCARDIOGRAM COMPLETE

## 2020-02-16 IMAGING — CT CT ANGIO HEAD
2 of 7 series · 8 of 33 positions shown · IV contrast (OMNI 350)
Comparison: None.

CLINICAL DATA: Stroke follow-up.

EXAM:
CT ANGIOGRAPHY HEAD AND NECK
TECHNIQUE: Multidetector CT imaging of the head and neck was performed using
the standard protocol during bolus administration of intravenous
contrast. Multiplanar CT image reconstructions and MIPs were
obtained to evaluate the vascular anatomy. Carotid stenosis
measurements (when applicable) are obtained utilizing NASCET
criteria, using the distal internal carotid diameter as the
denominator.
CONTRAST:  75mL OMNIPAQUE IOHEXOL 350 MG/ML SOLN

[Series 7: cta neck (person_name) · axial · 0.52mm/px · z∈[-185,-67]mm · 2 of 177 slices shown]
[im 59/177  soft-tissue]
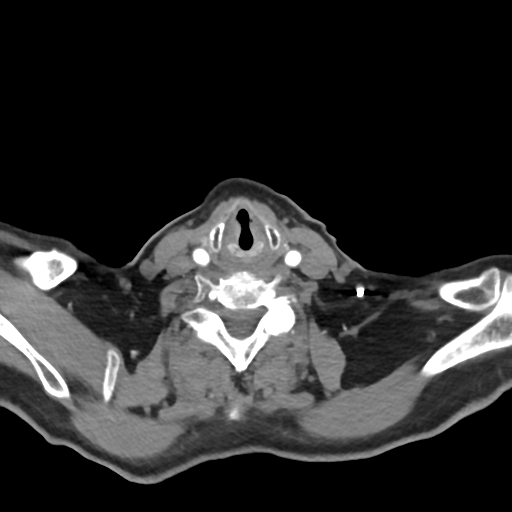
[im 118/177  soft-tissue]
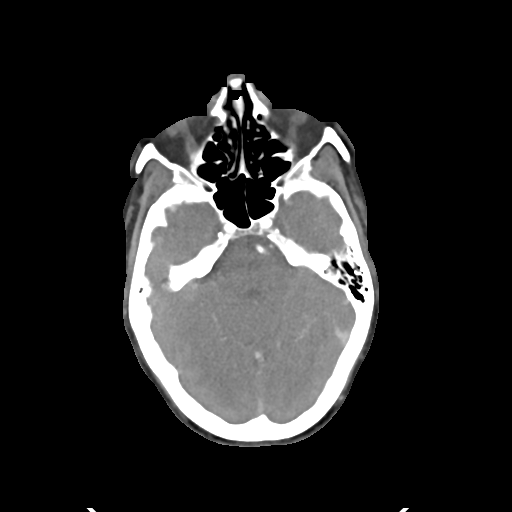

[Series 9: cta neck axial · axial · 0.39mm/px · z∈[-253,+1]mm · 6 of 356 slices shown]
[im 51/356  soft-tissue]
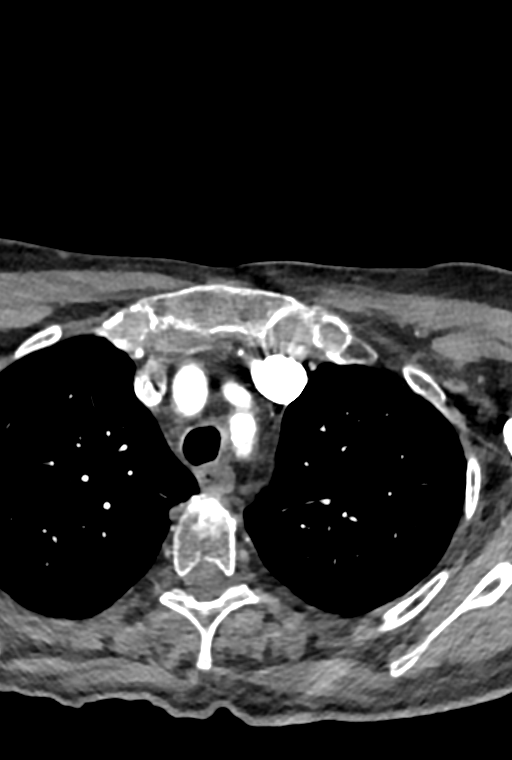
[im 102/356  bone]
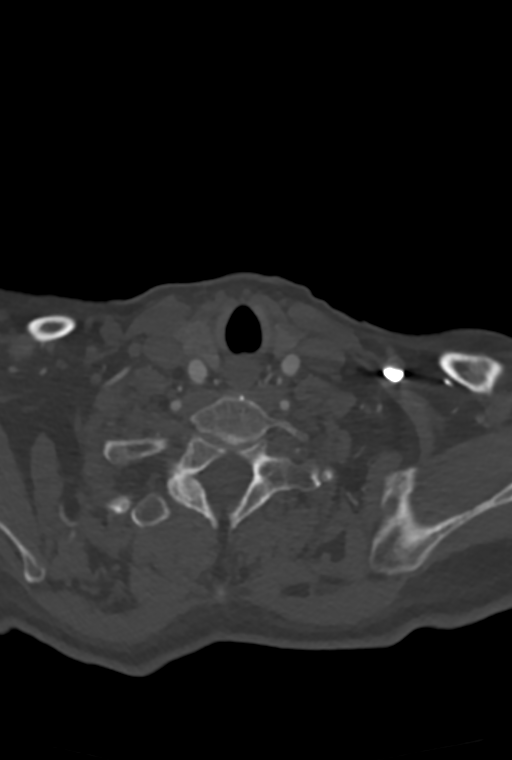
[im 153/356  soft-tissue]
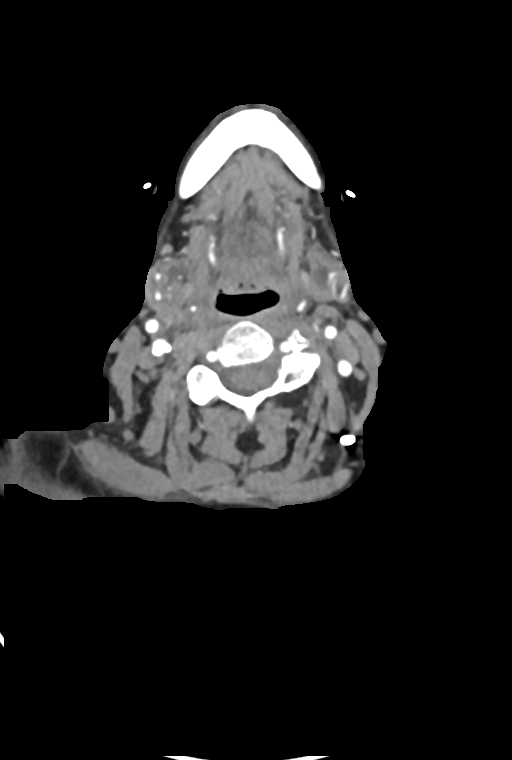
[im 203/356  bone]
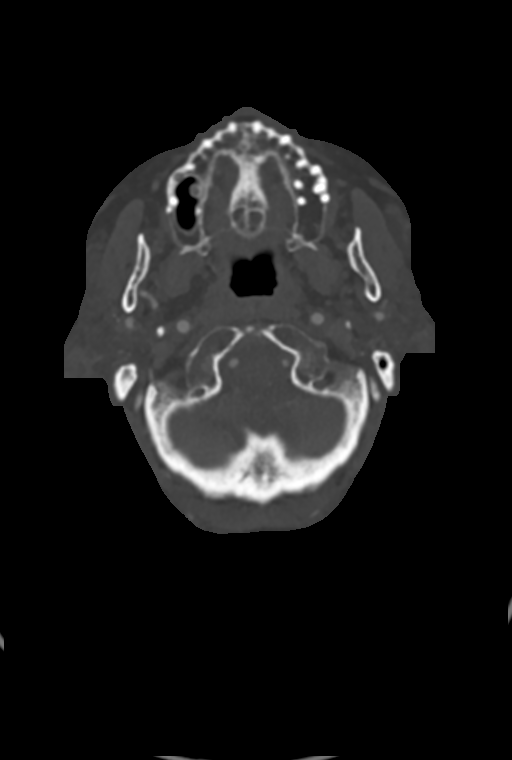
[im 254/356  soft-tissue]
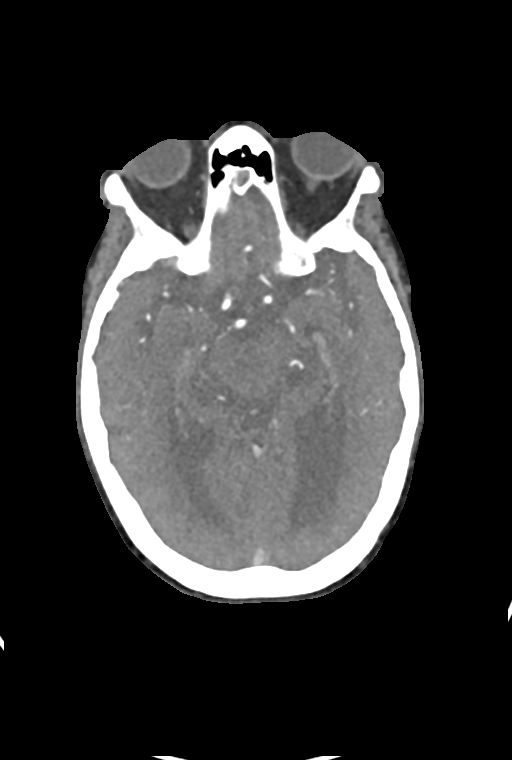
[im 305/356  bone]
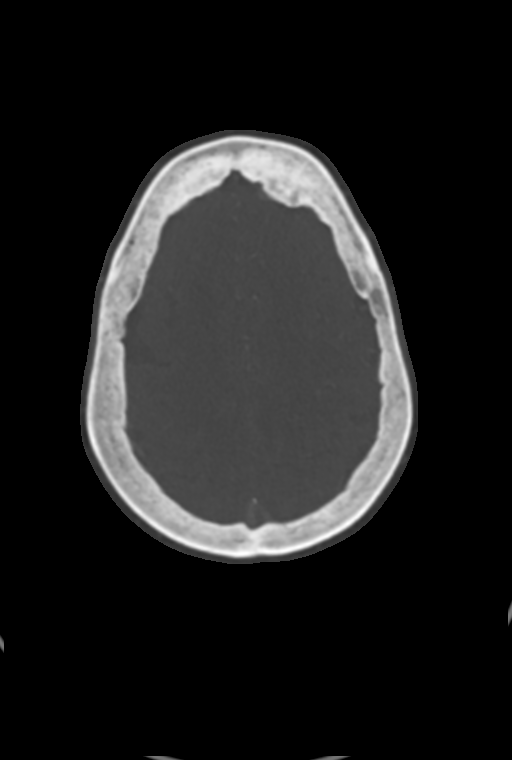

[8 of 33 positions shown; findings below may reference images not displayed]

FINDINGS: CTA NECK FINDINGS

Aortic arch: Standard 3 vessel aortic arch with mild atherosclerotic
plaque. No significant arch vessel origin stenosis.

Right carotid system: Patent with mild-to-moderate calcified plaque
in the carotid bulb. No evidence of significant stenosis or
dissection.

Left carotid system: Patent with mild, predominantly calcified
plaque at the carotid bifurcation. No evidence of significant
stenosis or dissection.

Vertebral arteries: Patent without evidence of significant stenosis
or dissection. Both V2 segments are tortuous.

Skeleton: Advanced left facet arthrosis at C3-4 and C4-5 with
minimal anterolisthesis.

Other neck: Multiple small right submandibular calculi measuring up
to 3 mm in size. Dilatation of the intraglandular and proximal
extraglandular submandibular ducts bilaterally without evidence of
acute sialadenitis. No evidence of cervical lymphadenopathy or mass.

Upper chest: Calcified right hilar lymph nodes.  Clear lung apices.

Review of the MIP images confirms the above findings

CTA HEAD FINDINGS

Anterior circulation: The internal carotid arteries are patent from
skull base to carotid termini with atherosclerotic plaque resulting
in mild right and moderate left paraclinoid stenoses. There is a 2
mm posteriorly projecting aneurysm from the proximal left
supraclinoid ICA. ACAs and MCAs are patent with moderate diffuse
atherosclerotic type irregularity but no proximal branch occlusion.
There is a mild to moderate distal right M1 stenosis, and there is
minimal to mild left M1 narrowing.

Posterior circulation: The intracranial vertebral arteries are
patent to the basilar with the right being dominant. There is
relatively diffuse atherosclerotic irregularity of both V4 segments
with up to mild stenosis. The basilar artery is patent with diffuse
irregularity and a mild stenosis proximally. Posterior communicating
arteries are diminutive or absent. The PCAs are patent with
extensive atherosclerotic type irregularity diffusely. There are
moderate right P1, moderate left P1-P2 junction, and severe right P2
stenoses. No aneurysm is identified.

Venous sinuses: As permitted by contrast timing, patent.

Anatomic variants: None.

Review of the MIP images confirms the above findings
IMPRESSION: 1. Widespread intracranial atherosclerosis without major branch
occlusion.
2. Mild right and moderate left paraclinoid ICA stenoses.
3. Mild to moderate right M1 and severe right P2 stenoses.
4. 2 mm left supraclinoid ICA aneurysm.
5. Widely patent cervical carotid and vertebral arteries.
6. Aortic Atherosclerosis ([D7]-[D7]).

## 2020-02-16 MED ORDER — POTASSIUM CHLORIDE CRYS ER 20 MEQ PO TBCR
40.0000 meq | EXTENDED_RELEASE_TABLET | Freq: Two times a day (BID) | ORAL | Status: AC
  Administered 2020-02-16 – 2020-02-17 (×2): 40 meq via ORAL
  Filled 2020-02-16 (×2): qty 2

## 2020-02-16 MED ORDER — INSULIN ASPART 100 UNIT/ML ~~LOC~~ SOLN
0.0000 [IU] | Freq: Three times a day (TID) | SUBCUTANEOUS | Status: DC
  Administered 2020-02-16: 5 [IU] via SUBCUTANEOUS
  Administered 2020-02-17 (×2): 2 [IU] via SUBCUTANEOUS
  Administered 2020-02-17: 3 [IU] via SUBCUTANEOUS
  Administered 2020-02-18: 2 [IU] via SUBCUTANEOUS
  Administered 2020-02-18: 3 [IU] via SUBCUTANEOUS
  Administered 2020-02-18: 5 [IU] via SUBCUTANEOUS
  Administered 2020-02-19: 3 [IU] via SUBCUTANEOUS
  Administered 2020-02-19: 5 [IU] via SUBCUTANEOUS
  Administered 2020-02-19: 2 [IU] via SUBCUTANEOUS

## 2020-02-16 MED ORDER — IOHEXOL 350 MG/ML SOLN
75.0000 mL | Freq: Once | INTRAVENOUS | Status: AC | PRN
  Administered 2020-02-16: 75 mL via INTRAVENOUS

## 2020-02-16 MED ORDER — ROSUVASTATIN CALCIUM 20 MG PO TABS
20.0000 mg | ORAL_TABLET | Freq: Every day | ORAL | Status: DC
  Administered 2020-02-16 – 2020-02-19 (×4): 20 mg via ORAL
  Filled 2020-02-16 (×4): qty 1

## 2020-02-16 MED ORDER — CLOPIDOGREL BISULFATE 75 MG PO TABS
75.0000 mg | ORAL_TABLET | Freq: Every day | ORAL | Status: DC
  Administered 2020-02-16 – 2020-02-19 (×4): 75 mg via ORAL
  Filled 2020-02-16 (×4): qty 1

## 2020-02-16 NOTE — ED Notes (Signed)
Lunch Tray Ordered @ 1050. 

## 2020-02-16 NOTE — ED Notes (Signed)
Got patient off the bedpan gave patient her meal patient is resting with call bell in reach
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
## 2020-02-16 NOTE — ED Notes (Signed)
Echo in progress at bedside.

## 2020-02-16 NOTE — ED Notes (Signed)
Pt finished eating.  Pt ate approx 95% of her lunch.  Pt resting at this time, no complaints voiced.

## 2020-02-16 NOTE — ED Notes (Signed)
Pt transported to CT ?

## 2020-02-16 NOTE — ED Notes (Signed)
Check blood sugar it was 208 notified RN of blood sugar patient is resting with call bell in reach

## 2020-02-16 NOTE — Progress Notes (Signed)
STROKE TEAM PROGRESS NOTE   INTERVAL HISTORY I have personally reviewed history of presenting illness with the patient, electronic medical records and imaging films and PACS.  She had a couple of falls recently and had some slurred speech.  MRI scan shows left posterior limb internal capsule as well as left parietal corona radiata infarcts lacunar.  Hemoglobin A1c 6.9.  Echocardiogram, CT angiograms and lipid profile are pending.  Vitals:   02/16/20 0344 02/16/20 0600 02/16/20 0701 02/16/20 0921  BP:  (!) 168/67 (!) 190/91 (!) 208/91  Pulse:  61 82 86  Resp:  13 17 19   Temp:      TempSrc:      SpO2: 99% 97% 95% 100%    CBC:  Recent Labs  Lab 02/15/20 1650  WBC 9.1  NEUTROABS 7.0  HGB 13.3  HCT 40.9  MCV 87.2  PLT 269    Basic Metabolic Panel:  Recent Labs  Lab 02/15/20 1650  NA 142  K 3.5  CL 105  CO2 22  GLUCOSE 173*  BUN 31*  CREATININE 1.14*  CALCIUM 9.7   Lipid Panel:     Component Value Date/Time   CHOL 223 (H) 02/16/2020 1400   TRIG 157 (H) 02/16/2020 1400   HDL 49 02/16/2020 1400   CHOLHDL 4.6 02/16/2020 1400   VLDL 31 02/16/2020 1400   LDLCALC 143 (H) 02/16/2020 1400   HgbA1c:  Lab Results  Component Value Date   HGBA1C 6.9 (H) 02/15/2020   Urine Drug Screen:     Component Value Date/Time   LABOPIA NONE DETECTED 01/02/2020 1815   COCAINSCRNUR NONE DETECTED 01/02/2020 1815   LABBENZ NONE DETECTED 01/02/2020 1815   AMPHETMU NONE DETECTED 01/02/2020 1815   THCU NONE DETECTED 01/02/2020 1815   LABBARB NONE DETECTED 01/02/2020 1815    Alcohol Level     Component Value Date/Time   ETH <10 01/02/2020 1312    IMAGING past 24 hours CT ANGIO HEAD W OR WO CONTRAST  Result Date: 02/16/2020 CLINICAL DATA:  Stroke follow-up. EXAM: CT ANGIOGRAPHY HEAD AND NECK TECHNIQUE: Multidetector CT imaging of the head and neck was performed using the standard protocol during bolus administration of intravenous contrast. Multiplanar CT image reconstructions and  MIPs were obtained to evaluate the vascular anatomy. Carotid stenosis measurements (when applicable) are obtained utilizing NASCET criteria, using the distal internal carotid diameter as the denominator. CONTRAST:  33mL OMNIPAQUE IOHEXOL 350 MG/ML SOLN COMPARISON:  None. FINDINGS: CTA NECK FINDINGS Aortic arch: Standard 3 vessel aortic arch with mild atherosclerotic plaque. No significant arch vessel origin stenosis. Right carotid system: Patent with mild-to-moderate calcified plaque in the carotid bulb. No evidence of significant stenosis or dissection. Left carotid system: Patent with mild, predominantly calcified plaque at the carotid bifurcation. No evidence of significant stenosis or dissection. Vertebral arteries: Patent without evidence of significant stenosis or dissection. Both V2 segments are tortuous. Skeleton: Advanced left facet arthrosis at C3-4 and C4-5 with minimal anterolisthesis. Other neck: Multiple small right submandibular calculi measuring up to 3 mm in size. Dilatation of the intraglandular and proximal extraglandular submandibular ducts bilaterally without evidence of acute sialadenitis. No evidence of cervical lymphadenopathy or mass. Upper chest: Calcified right hilar lymph nodes.  Clear lung apices. Review of the MIP images confirms the above findings CTA HEAD FINDINGS Anterior circulation: The internal carotid arteries are patent from skull base to carotid termini with atherosclerotic plaque resulting in mild right and moderate left paraclinoid stenoses. There is a 2 mm posteriorly projecting aneurysm  from the proximal left supraclinoid ICA. ACAs and MCAs are patent with moderate diffuse atherosclerotic type irregularity but no proximal branch occlusion. There is a mild to moderate distal right M1 stenosis, and there is minimal to mild left M1 narrowing. Posterior circulation: The intracranial vertebral arteries are patent to the basilar with the right being dominant. There is relatively  diffuse atherosclerotic irregularity of both V4 segments with up to mild stenosis. The basilar artery is patent with diffuse irregularity and a mild stenosis proximally. Posterior communicating arteries are diminutive or absent. The PCAs are patent with extensive atherosclerotic type irregularity diffusely. There are moderate right P1, moderate left P1-P2 junction, and severe right P2 stenoses. No aneurysm is identified. Venous sinuses: As permitted by contrast timing, patent. Anatomic variants: None. Review of the MIP images confirms the above findings IMPRESSION: 1. Widespread intracranial atherosclerosis without major branch occlusion. 2. Mild right and moderate left paraclinoid ICA stenoses. 3. Mild to moderate right M1 and severe right P2 stenoses. 4. 2 mm left supraclinoid ICA aneurysm. 5. Widely patent cervical carotid and vertebral arteries. 6. Aortic Atherosclerosis (ICD10-I70.0). Electronically Signed   By: Sebastian AcheAllen  Grady M.D.   On: 02/16/2020 08:44   CT Head Wo Contrast  Result Date: 02/15/2020 CLINICAL DATA:  Subacute neuro deficit. Dizziness. Fall a few days ago. EXAM: CT HEAD WITHOUT CONTRAST TECHNIQUE: Contiguous axial images were obtained from the base of the skull through the vertex without intravenous contrast. COMPARISON:  Jan 02, 2020 FINDINGS: Brain: No subdural, epidural, or subarachnoid hemorrhage. Ventricles and sulci are prominent but stable. Moderate white matter changes are stable. Scattered lacunar infarcts are stable. No acute cortical ischemia or infarct. No mass effect or midline shift. A focus of high attenuation in the right cerebellum on coronal image 47 correlates with a region of susceptibility artifact on an MRI from Jan 02, 2020 and likely represents either calcification or nonacute blood. This does not appear to be an acute finding based on the comparison MRI. Vascular: Calcified atherosclerosis in the intracranial carotids. Skull: Normal. Negative for fracture or focal lesion.  Sinuses/Orbits: No acute finding. Other: None. IMPRESSION: No acute intracranial abnormalities. Chronic white matter changes and lacunar infarcts. Electronically Signed   By: Gerome Samavid  Williams III M.D   On: 02/15/2020 17:50   CT ANGIO NECK W OR WO CONTRAST  Result Date: 02/16/2020 CLINICAL DATA:  Stroke follow-up. EXAM: CT ANGIOGRAPHY HEAD AND NECK TECHNIQUE: Multidetector CT imaging of the head and neck was performed using the standard protocol during bolus administration of intravenous contrast. Multiplanar CT image reconstructions and MIPs were obtained to evaluate the vascular anatomy. Carotid stenosis measurements (when applicable) are obtained utilizing NASCET criteria, using the distal internal carotid diameter as the denominator. CONTRAST:  75mL OMNIPAQUE IOHEXOL 350 MG/ML SOLN COMPARISON:  None. FINDINGS: CTA NECK FINDINGS Aortic arch: Standard 3 vessel aortic arch with mild atherosclerotic plaque. No significant arch vessel origin stenosis. Right carotid system: Patent with mild-to-moderate calcified plaque in the carotid bulb. No evidence of significant stenosis or dissection. Left carotid system: Patent with mild, predominantly calcified plaque at the carotid bifurcation. No evidence of significant stenosis or dissection. Vertebral arteries: Patent without evidence of significant stenosis or dissection. Both V2 segments are tortuous. Skeleton: Advanced left facet arthrosis at C3-4 and C4-5 with minimal anterolisthesis. Other neck: Multiple small right submandibular calculi measuring up to 3 mm in size. Dilatation of the intraglandular and proximal extraglandular submandibular ducts bilaterally without evidence of acute sialadenitis. No evidence of cervical lymphadenopathy or mass.  Upper chest: Calcified right hilar lymph nodes.  Clear lung apices. Review of the MIP images confirms the above findings CTA HEAD FINDINGS Anterior circulation: The internal carotid arteries are patent from skull base to  carotid termini with atherosclerotic plaque resulting in mild right and moderate left paraclinoid stenoses. There is a 2 mm posteriorly projecting aneurysm from the proximal left supraclinoid ICA. ACAs and MCAs are patent with moderate diffuse atherosclerotic type irregularity but no proximal branch occlusion. There is a mild to moderate distal right M1 stenosis, and there is minimal to mild left M1 narrowing. Posterior circulation: The intracranial vertebral arteries are patent to the basilar with the right being dominant. There is relatively diffuse atherosclerotic irregularity of both V4 segments with up to mild stenosis. The basilar artery is patent with diffuse irregularity and a mild stenosis proximally. Posterior communicating arteries are diminutive or absent. The PCAs are patent with extensive atherosclerotic type irregularity diffusely. There are moderate right P1, moderate left P1-P2 junction, and severe right P2 stenoses. No aneurysm is identified. Venous sinuses: As permitted by contrast timing, patent. Anatomic variants: None. Review of the MIP images confirms the above findings IMPRESSION: 1. Widespread intracranial atherosclerosis without major branch occlusion. 2. Mild right and moderate left paraclinoid ICA stenoses. 3. Mild to moderate right M1 and severe right P2 stenoses. 4. 2 mm left supraclinoid ICA aneurysm. 5. Widely patent cervical carotid and vertebral arteries. 6. Aortic Atherosclerosis (ICD10-I70.0). Electronically Signed   By: Sebastian Ache M.D.   On: 02/16/2020 08:44   MR BRAIN W WO CONTRAST  Result Date: 02/15/2020 CLINICAL DATA:  Initial evaluation for acute encephalopathy. EXAM: MRI HEAD WITHOUT AND WITH CONTRAST TECHNIQUE: Multiplanar, multiecho pulse sequences of the brain and surrounding structures were obtained without and with intravenous contrast. CONTRAST:  20mL GADAVIST GADOBUTROL 1 MMOL/ML IV SOLN COMPARISON:  Comparison made with prior head CT from earlier the same day  as well as recent brain MRI from 01/02/2020. FINDINGS: Brain: Diffuse prominence of the CSF containing spaces compatible with generalized age-related cerebral atrophy. Moderate to advanced chronic microvascular ischemic disease again noted involving the periventricular and deep white matter both cerebral hemispheres, with patchy involvement of the pons and brainstem. Multiple scattered superimposed remote lacunar infarcts present about the bilateral basal ganglia and thalami. 8 mm focus of restricted diffusion seen centered at the posterior limb of the left internal capsule (series 5, image 78) consistent with an acute ischemic infarct. There is an additional 5 mm focus of vague diffusion abnormality involving the subcortical left parietal lobe without ADC correlate, likely an evolving subacute ischemic infarct (series 5, image 87). No associated hemorrhage or mass effect about these ischemic changes. No evidence for acute intracranial hemorrhage. Few scattered chronic micro hemorrhages noted involving the right cerebellum, right thalamus, and left periatrial white matter, most likely related to underlying hypertension/small vessel disease. Single 7 mm focus of curvilinear enhancement seen involving the left parietal gray matter (series 18, image 37), new from recent brain MRI, but favored to be related to an evolving subacute ischemic infarct given the close proximity to the above-mentioned mild diffusion abnormality. No other foci of abnormal enhancement. Previously described stippled enhancement seen involving the brainstem and periaqueductal gray matter is now perhaps only faintly visualized, decreased in prominence from prior exam. Finding felt to be have been related to a subacute ischemic lacunar type infarct, now more chronic and evident on today's exam (series 15, image 12 on T2 weighted sequence). No other abnormal enhancement. No mass lesion,  midline shift, or mass effect. Diffuse ventricular prominence  related to global parenchymal volume loss without hydrocephalus. No extra-axial fluid collection. Partially empty sella noted. Midline structures intact. Vascular: Major intracranial vascular flow voids are maintained. Skull and upper cervical spine: Craniocervical junction within normal limits. Bone marrow signal intensity normal. No scalp soft tissue abnormality. Sinuses/Orbits: Globes and orbital soft tissues within normal limits. Mild scattered mucosal thickening noted within the ethmoidal air cells and maxillary sinuses. Trace right mastoid effusion noted, of doubtful significance. Inner ear structures grossly normal. Other: None. IMPRESSION: 1. 8 mm acute ischemic nonhemorrhagic infarct involving the posterior limb of the left internal capsule. 2. Additional 5 mm focus of vague diffusion abnormality involving the subcortical left parietal lobe, likely an evolving subacute ischemic infarct. 3. 7 mm focus of curvilinear enhancement involving the left parietal cortex, new as compared to recent brain MRI, and favored to be secondary to evolving subacute ischemia given the close proximity to the above mention diffusion abnormality. Short interval follow-up MRI in 3 months is suggested to ensure these changes resolve. 4. Near complete interval resolution of previously seen stippled enhancement involving the periaqueductal gray matter/brainstem. Finding felt to have been related to an evolving subacute ischemic infarct on prior exam, now more chronic and evident on today's exam. 5. Underlying age-related cerebral atrophy with fairly advanced chronic microvascular ischemic disease. Electronically Signed   By: Jeannine Boga M.D.   On: 02/15/2020 21:39    PHYSICAL EXAM Pleasant elderly Caucasian lady not in distress. . Afebrile. Head is nontraumatic. Neck is supple without bruit.    Cardiac exam no murmur or gallop. Lungs are clear to auscultation. Distal pulses are well felt. Neurological Exam ;  Awake   Alert oriented x 3. Normal speech and language.eye movements full without nystagmus.fundi were not visualized. Vision acuity and fields appear normal. Hearing is normal. Palatal movements are normal. Face asymmetric with subtle right face weakness. Tongue midline. Mild right hemiparesis 4/5 strength with right grip and intrinsic hand muscle weakness.  Normal strength on the left normal sensation. Gait deferred.   ASSESSMENT/PLAN Ms. Tasha [name] is a 74 y.o. female with history of HTN, MI, remote TBI causing seizures and prior stroke presenting with generalized weakness and dizziness since Tues, who fell on Wed unable to get [place], found by family in Sunday and brought to the hospital.   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 Small vessel disease. Old lacunes.  MRI  L PLIC infarct. Possible subacute L parietal subcortical infarct.  Possible subacute L parietal cortex infarct. Chronic brainstem infarct. Small vessel disease. Advanced Atrophy.   CTA head & neck widespread intracranial atherosclerosis. Mild R and moderate L paraclinoid ICA, mid to moderate R M1, severe R P2 stenoses. 59mm L supraclinoid ICA aneurysm. Aortic atherosclerosis.   2D Echo EF 70-75%. No source of embolus   LDL 143  HgbA1c 6.9  Lovenox 40 mg sq daily for VTE prophylaxis  aspirin 81 mg daily prior to admission, now on aspirin 81 mg daily. Add plavix to aspirin x 3 weeks then plavix alone, orders adjusted   Therapy recommendations:  pending   Disposition:  pending   Hypertension  On the high side . Permissive hypertension (OK if < 220/120) but gradually normalize in 5-7 days . Long-term BP goal normotensive  Hyperlipidemia  Home meds:  No stain - listed intolerance to lipitor  LDL 143,  goal < 70  Consider trial of alternative statin for tolerance  Diabetes type II Controlled  HgbA1c 6.9, goal <  7.0  Other Stroke Risk Factors  Advanced age  ETOH use, alcohol level <10, advised to drink no more than 1 drink(s) a day  Hx stroke/TIA - details not available - reported r/t TBI  CAD s/p MI  Other Active Problems  Hx TBI causing seizures and prior stroke  Passed out x 4 hours in May  Tremor L hand x 1 year, not evaluated  Hospital day # 1 Patient has presented with multiple subcortical infarct likely from small vessel disease.  Recommend aspirin Plavix for 3 weeks followed by Plavix alone.  Check echocardiogram, lipids and CT angiograms.  Continue aggressive risk factor modification.  Therapy consults.  Greater than 50% time during this 35-minute visit was spent on counseling and coordination of care and discussion with care team about her lacunar strokes Delia Heady, MD To contact Stroke Continuity provider, please refer to [email or web address]. After hours, contact General Neurology

## 2020-02-16 NOTE — ED Notes (Signed)
Help patient get on a bed pan patient has call bell in reach

## 2020-02-16 NOTE — Progress Notes (Signed)
°  Echocardiogram 2D Echocardiogram has been performed.  Celene Skeen 02/16/2020, 8:54 AM

## 2020-02-17 LAB — COMPREHENSIVE METABOLIC PANEL
ALT: 19 U/L (ref 0–44)
AST: 14 U/L — ABNORMAL LOW (ref 15–41)
Albumin: 3.3 g/dL — ABNORMAL LOW (ref 3.5–5.0)
Alkaline Phosphatase: 46 U/L (ref 38–126)
Anion gap: 9 (ref 5–15)
BUN: 21 mg/dL (ref 8–23)
CO2: 23 mmol/L (ref 22–32)
Calcium: 9.2 mg/dL (ref 8.9–10.3)
Chloride: 106 mmol/L (ref 98–111)
Creatinine, Ser: 0.9 mg/dL (ref 0.44–1.00)
GFR calc Af Amer: >60 mL/min (ref >60)
GFR calc non Af Amer: >60 mL/min (ref >60)
Glucose, Bld: 195 mg/dL — ABNORMAL HIGH (ref 70–99)
Potassium: 3.4 mmol/L — ABNORMAL LOW (ref 3.5–5.1)
Sodium: 138 mmol/L (ref 135–145)
Total Bilirubin: 0.9 mg/dL (ref 0.3–1.2)
Total Protein: 5.9 g/dL — ABNORMAL LOW (ref 6.5–8.1)

## 2020-02-17 LAB — CBC
HCT: 35.2 % — ABNORMAL LOW (ref 36.0–46.0)
Hemoglobin: 11.8 g/dL — ABNORMAL LOW (ref 12.0–15.0)
MCH: 29 pg (ref 26.0–34.0)
MCHC: 33.5 g/dL (ref 30.0–36.0)
MCV: 86.5 fL (ref 80.0–100.0)
Platelets: 236 10*3/uL (ref 150–400)
RBC: 4.07 MIL/uL (ref 3.87–5.11)
RDW: 13.4 % (ref 11.5–15.5)
WBC: 7 10*3/uL (ref 4.0–10.5)
nRBC: 0 % (ref 0.0–0.2)

## 2020-02-17 LAB — GLUCOSE, CAPILLARY
Glucose-Capillary: 140 mg/dL — ABNORMAL HIGH (ref 70–99)
Glucose-Capillary: 165 mg/dL — ABNORMAL HIGH (ref 70–99)
Glucose-Capillary: 133 mg/dL — ABNORMAL HIGH (ref 70–99)
Glucose-Capillary: 151 mg/dL — ABNORMAL HIGH (ref 70–99)

## 2020-02-17 MED ORDER — HYDROCHLOROTHIAZIDE 12.5 MG PO CAPS
12.5000 mg | ORAL_CAPSULE | Freq: Every day | ORAL | Status: DC
  Administered 2020-02-17: 12.5 mg via ORAL
  Filled 2020-02-17: qty 1

## 2020-02-17 NOTE — Progress Notes (Signed)
STROKE TEAM PROGRESS NOTE   INTERVAL HISTORY Patient is doing fine.  No changes.  Echocardiogram shows hyperdynamic LV function with EF of 70 to 75%.  LDL cholesterol elevated at 143 mg percent.  CT angiogram showed widespread bilateral intracranial atherosclerosis.  Vitals:   02/17/20 0126 02/17/20 0446 02/17/20 0713 02/17/20 1113  BP: (!) 188/88 (!) 214/95 (!) 193/88 (!) 188/94  Pulse:  66 72 70  Resp: 18 18 18 16   Temp:  97.9 F (36.6 C) 98.6 F (37 C) 98 F (36.7 C)  TempSrc:  Oral Oral Oral  SpO2:  95% 99% 99%  Weight:      Height:       CBC:  Recent Labs  Lab 02/15/20 1650 02/15/20 1650 02/16/20 1400 02/17/20 0741  WBC 9.1   < > 9.2 7.0  NEUTROABS 7.0  --   --   --   HGB 13.3   < > 12.5 11.8*  HCT 40.9   < > 38.3 35.2*  MCV 87.2   < > 87.6 86.5  PLT 269   < > 268 236   < > = values in this interval not displayed.   Basic Metabolic Panel:  Recent Labs  Lab 02/16/20 1400 02/17/20 0741  NA 140 138  K 3.0* 3.4*  CL 107 106  CO2 23 23  GLUCOSE 177* 195*  BUN 25* 21  CREATININE 0.99 0.90  CALCIUM 9.3 9.2   Lipid Panel:     Component Value Date/Time   CHOL 223 (H) 02/16/2020 1400   TRIG 157 (H) 02/16/2020 1400   HDL 49 02/16/2020 1400   CHOLHDL 4.6 02/16/2020 1400   VLDL 31 02/16/2020 1400   LDLCALC 143 (H) 02/16/2020 1400   HgbA1c:  Lab Results  Component Value Date   HGBA1C 6.9 (H) 02/15/2020   Urine Drug Screen:     Component Value Date/Time   LABOPIA NONE DETECTED 01/02/2020 1815   COCAINSCRNUR NONE DETECTED 01/02/2020 1815   LABBENZ NONE DETECTED 01/02/2020 1815   AMPHETMU NONE DETECTED 01/02/2020 1815   THCU NONE DETECTED 01/02/2020 1815   LABBARB NONE DETECTED 01/02/2020 1815    Alcohol Level     Component Value Date/Time   ETH <10 01/02/2020 1312    IMAGING past 24 hours No results found.  PHYSICAL EXAM  Pleasant elderly Caucasian lady not in distress. . Afebrile. Head is nontraumatic. Neck is supple without bruit.    Cardiac  exam no murmur or gallop. Lungs are clear to auscultation. Distal pulses are well felt. Neurological Exam ;  Awake  Alert oriented x 3. Normal speech and language.eye movements full without nystagmus.fundi were not visualized. Vision acuity and fields appear normal. Hearing is normal. Palatal movements are normal. Face asymmetric with subtle right face weakness. Tongue midline. Mild right hemiparesis 4/5 strength with right grip and intrinsic hand muscle weakness.  Normal strength on the left normal sensation. Gait deferred.   ASSESSMENT/PLAN Ms. Tangelia Sanson is a 74 y.o. female with history of HTN, MI, remote TBI causing seizures and prior stroke presenting with generalized weakness and dizziness since Tues, who fell on Wed unable to get [place], found by family in Sunday and brought to the hospital.   Stroke:   L parietal cortical and subcortical infarcts,  secondary to small vessel disease likely  Code Stroke CT head No acute abnormality. Small vessel disease. Old lacunes.  MRI  L PLIC infarct. Possible subacute L parietal subcortical infarct.  Possible subacute L parietal cortex infarct.  Chronic brainstem infarct. Small vessel disease. Advanced Atrophy.   CTA head & neck widespread intracranial atherosclerosis. Mild R and moderate L paraclinoid ICA, mid to moderate R M1, severe R P2 stenoses. 64mm L supraclinoid ICA aneurysm. Aortic atherosclerosis.   2D Echo EF 70-75%. No source of embolus   LDL 143  HgbA1c 6.9  Lovenox 40 mg sq daily for VTE prophylaxis  aspirin 81 mg daily prior to admission, now on aspirin 81 mg daily and clopidogrel 75 mg daily x 3 weeks then plavix alone, orders adjusted   Therapy recommendations:  pending   Disposition:  pending   Hypertension  On the high side . Permissive hypertension (OK if < 220/120) but gradually normalize in 5-7 days . Long-term BP goal normotensive  Hyperlipidemia  Home meds:  No stain - listed intolerance to lipitor  LDL 143, goal  < 70  Consider trial of alternative statin for tolerance  Diabetes type II Controlled  HgbA1c 6.9, goal < 7.0  Other Stroke Risk Factors  Advanced age  ETOH use, alcohol level <10, advised to drink no more than 1 drink(s) a day  Hx stroke/TIA - details not available - reported r/t TBI  CAD s/p MI  Other Active Problems  Hx TBI causing seizures and prior stroke  Passed out x 4 hours in May  Tremor L hand x 1 year, not evaluated  Hospital day # 2  Continue dual antiplatelet therapy for 3 weeks followed by Plavix alone and aggressive risk factor modification.  Follow-up as an outpatient with stroke clinic in 6 weeks.  Stroke team will sign off.  Kindly call for questions.  Antony Contras, MD To contact Stroke Continuity provider, please refer to [email or web address] After hours, contact General Neurology

## 2020-02-17 NOTE — Progress Notes (Signed)
Occupational Therapy Evaluation Patient Details Name: Alianny Toelle MRN: [id number] DOB: 1946-05-11 Today's Date: 02/17/2020    History of Present Illness Christinea Brizuela is an 74 y.o. female with a PMHx of HTN, MI, remote TBI causing seizures and prior stroke who presented to the ED via EMS with c/c of generalized weakness and dizziness since Tuesday, followed by a fall on Wednesday from which she could not get up. She was found by family on Sunday. BP per EMS was 244/120. MRI brain revealed a subcentimeter acute ischemic infarction in the posterior limb of the left internal capsule.    Clinical Impression   Patient lives in a level apartment alone at baseline and uses rollator for mobility.  She is typically independent and has family available PRN.  Patient presenting today with poor coordination, decreased strength, expressive difficulties, and decreased cognition.  Patient needing increased verbal and tactile cueing as well as cues to keep from getting distracted.  She was able to get to EOB with min assist and maintain seated balance.  Requiring min assist with LB ADLs and min guard/setup for UB.  R UE 3-/5 strength and noted shakiness/tremors in L UE.  Required max assist with RW to stand pivot to chair as she had difficulty moving R foot.  Overall patient moves very slowly.  She is motivated though.  Will continue to follow with OT acutely to address the deficits listed below.      Follow Up Recommendations  SNF;Supervision/Assistance - 24 hour    Equipment Recommendations  Other (comment) (defer to next venue)    Recommendations for Other Services       Precautions / Restrictions Precautions Precautions: Fall Restrictions Weight Bearing Restrictions: No      Mobility Bed Mobility Overal bed mobility: Needs Assistance Bed Mobility: Supine to Sit     Supine to sit: Min assist     General bed mobility comments: Moves very slowly and able to do most of mobility with HOB raised  and rails, needs help scooting forward and straightening upright  Transfers Overall transfer level: Needs assistance Equipment used: Rolling walker (2 wheeled) Transfers: Sit to/from [employer] Sit to Stand: Mod assist Stand pivot transfers: Max assist       General transfer comment: Trouble taking steps with R leg, moves slowly and gets fatigued.    Balance Overall balance assessment: History of Falls;Needs assistance Sitting-balance support: No upper extremity supported;Feet supported Sitting balance-Leahy Scale: Fair     Standing balance support: Bilateral upper extremity supported;During functional activity Standing balance-Leahy Scale: Poor Standing balance comment: Needed RW support as well as therapist support                           ADL either performed or assessed with clinical judgement   ADL Overall ADL's : Needs assistance/impaired Eating/Feeding: Set up;Sitting   Grooming: Set up;Sitting   Upper Body Bathing: Min guard;Sitting;Set up   Lower Body Bathing: Minimal assistance;Sitting/lateral leans   Upper Body Dressing : Min guard;Set up;Sitting   Lower Body Dressing: Minimal assistance;Sitting/lateral leans   Toilet Transfer: Maximal assistance;RW;BSC           Functional mobility during ADLs: Maximal assistance;+2 for safety/equipment;Rolling walker       Vision Baseline Vision/History: Wears glasses Wears Glasses: At all times Patient Visual Report: Blurring of vision;Other (comment) ("Tougher to brinf things into focus") Vision Assessment?: Yes Eye Alignment: Within Functional Limits Ocular Range of Motion: Within Functional  Limits Alignment/Gaze Preference: Within Defined Limits Tracking/Visual Pursuits: Able to track stimulus in all quads without difficulty Convergence: Within functional limits Visual Fields: No apparent deficits     Perception     Praxis      Pertinent Vitals/Pain Pain Assessment:  Faces Faces Pain Scale: Hurts a little bit Pain Location: R leg Pain Descriptors / Indicators: Sore Pain Intervention(s): Monitored during session     Hand Dominance Right   Extremity/Trunk Assessment Upper Extremity Assessment Upper Extremity Assessment: RUE deficits/detail;LUE deficits/detail RUE Deficits / Details: Gross strength 3-/5, no sensation impairment.  Poor coordination with finger to nose RUE Sensation: WNL RUE Coordination: decreased fine motor;decreased gross motor LUE Deficits / Details: Gross strength 3+/5, general shakiness/tremors which she states she did not have prior to event LUE Coordination: decreased fine motor;decreased gross motor   Lower Extremity Assessment Lower Extremity Assessment: Defer to PT evaluation (Noted decreased R strength/coordination. L LE shakiness)       Communication Communication Communication: Expressive difficulties   Cognition Arousal/Alertness: Awake/alert Behavior During Therapy: WFL for tasks assessed/performed Overall Cognitive Status: Impaired/Different from baseline Area of Impairment: Attention;Memory;Following commands;Awareness;Problem solving                   Current Attention Level: Selective Memory: Decreased short-term memory Following Commands: Follows one step commands with increased time   Awareness: Emergent Problem Solving: Slow processing;Decreased initiation;Difficulty sequencing;Requires verbal cues;Requires tactile cues General Comments: Patient requiring additional cueing to complete tasks.  She moves very slowly.  Difficulty word finding.     General Comments       Exercises     Shoulder Instructions      Home Living Family/patient expects to be discharged to:: Private residence Living Arrangements: Alone Available Help at Discharge: Family;Available PRN/intermittently Type of Home: Apartment Home Access: Level entry     Home Layout: One level     Bathroom Shower/Tub: Tub/shower  unit         Home Equipment: Walker - 4 wheels;Shower seat;Grab bars - tub/shower;Grab bars - toilet          Prior Functioning/Environment Level of Independence: Independent with assistive device(s)        Comments: Uses rollator for mobility, though has had 2 falls in last 6 months        OT Problem List: Decreased strength;Decreased activity tolerance;Impaired balance (sitting and/or standing);Decreased coordination;Decreased cognition;Decreased safety awareness;Decreased knowledge of use of DME or AE;Impaired UE functional use      OT Treatment/Interventions: Self-care/ADL training;Therapeutic exercise;Neuromuscular education;Energy conservation;DME and/or AE instruction;Therapeutic activities;Cognitive remediation/compensation;Patient/family education;Balance training    OT Goals(Current goals can be found in the care plan section) Acute Rehab OT Goals Patient Stated Goal: to go back home OT Goal Formulation: With patient Time For Goal Achievement: 03/02/20 Potential to Achieve Goals: Good  OT Frequency: Min 2X/week   Barriers to D/C: Decreased caregiver support          Co-evaluation              AM-PAC OT "6 Clicks" Daily Activity     Outcome Measure Help from another person eating meals?: A Little Help from another person taking care of personal grooming?: A Little Help from another person toileting, which includes using toliet, bedpan, or urinal?: A Lot Help from another person bathing (including washing, rinsing, drying)?: A Lot Help from another person to put on and taking off regular upper body clothing?: A Little Help from another person to put on and taking off regular  lower body clothing?: A Lot 6 Click Score: 15   End of Session Equipment Utilized During Treatment: Rolling walker;Gait belt Nurse Communication: Mobility status  Activity Tolerance: Patient tolerated treatment well Patient left: in chair;with call bell/phone within reach  OT  Visit Diagnosis: Unsteadiness on feet (R26.81);History of falling (Z91.81);Muscle weakness (generalized) (M62.81);Other symptoms and signs involving the nervous system (R29.898);Other symptoms and signs involving cognitive function                Time: 5110-2111 OT Time Calculation (min): 56 min Charges:  OT General Charges $OT Visit: 1 Visit OT Evaluation $OT Eval Moderate Complexity: 1 Mod OT Treatments $Self Care/Home Management : 23-37 mins $Therapeutic Activity: 8-22 mins   Barbie Banner, OTR/L   Adella Hare 02/17/2020, 12:54 PM
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
## 2020-02-17 NOTE — Evaluation (Signed)
Physical Therapy Evaluation Patient Details Name: Tasha [name] MRN: [id number] DOB: Jul 27, 1946 Today's Date: 02/17/2020   History of Present Illness  Tasha [name] is an 74 y.o. female with a PMHx of HTN, MI, remote TBI causing seizures and prior stroke who presented to the ED via EMS with c/c of generalized weakness and dizziness since Tuesday, followed by a fall on Wednesday from which she could not get up. She was found by family on Sunday. BP per EMS was 244/120. MRI brain revealed a subcentimeter acute ischemic infarction in the posterior limb of the left internal capsule.   Clinical Impression   Pt admitted with above diagnosis. Patient lives in a level apartment alone at baseline and uses rollator for mobility.  She is typically independent and has family available PRN.  Patient presenting today with R sided weakness,  poor coordination, decreased strength, expressive difficulties, and decreased cognition leading to decr safety with functional mobility.  Patient needing increased verbal and tactile cueing as well as cues to keep from getting distracted. Pt currently with functional limitations due to the deficits listed below (see PT Problem List). Pt will benefit from skilled PT to increase their independence and safety with mobility to allow discharge to the venue listed below.       Follow Up Recommendations SNF    Equipment Recommendations  Rolling walker with 5" wheels;3in1 (PT)    Recommendations for Other Services       Precautions / Restrictions Precautions Precautions: Fall Restrictions Weight Bearing Restrictions: No      Mobility  Bed Mobility Overal bed mobility: Needs Assistance Bed Mobility: Supine to Sit     Supine to sit: Min assist     General bed mobility comments: Moves very slowly and able to do most of mobility with HOB raised and rails, needs help scooting forward and straightening upright  Transfers Overall transfer level: Needs  assistance Equipment used: Rolling walker (2 wheeled) Transfers: Sit to/from [employer] Sit to Stand: Mod assist Stand pivot transfers: Max assist       General transfer comment: Posterior bias; Trouble taking steps with R leg, moves slowly and gets fatigued.  Ambulation/Gait                Stairs            Wheelchair Mobility    Modified Rankin (Stroke Patients Only)       Balance Overall balance assessment: History of Falls;Needs assistance Sitting-balance support: No upper extremity supported;Feet supported Sitting balance-Leahy Scale: Fair     Standing balance support: Bilateral upper extremity supported;During functional activity Standing balance-Leahy Scale: Poor Standing balance comment: Heavy posterior lean                             Pertinent Vitals/Pain Pain Assessment: Faces Faces Pain Scale: Hurts a little bit Pain Location: R leg Pain Descriptors / Indicators: Sore Pain Intervention(s): Monitored during session    Home Living Family/patient expects to be discharged to:: Private residence Living Arrangements: Alone Available Help at Discharge: Family;Available PRN/intermittently Type of Home: Apartment Home Access: Level entry     Home Layout: One level Home Equipment: Walker - 4 wheels;Shower seat;Grab bars - tub/shower;Grab bars - toilet      Prior Function Level of Independence: Independent with assistive device(s)         Comments: Uses rollator for mobility, though has had 2 falls in last 6 months  Hand Dominance   Dominant Hand: Right    Extremity/Trunk Assessment   Upper Extremity Assessment Upper Extremity Assessment: Defer to OT evaluation RUE Deficits / Details: Gross strength 3-/5, no sensation impairment.  Poor coordination with finger to nose RUE Sensation: WNL RUE Coordination: decreased fine motor;decreased gross motor LUE Deficits / Details: Gross strength 3+/5, general  shakiness/tremors which she states she did not have prior to event LUE Coordination: decreased fine motor;decreased gross motor    Lower Extremity Assessment Lower Extremity Assessment: RLE deficits/detail RLE Deficits / Details: grossly 3+/5 throughout       Communication   Communication: Expressive difficulties  Cognition Arousal/Alertness: Awake/alert Behavior During Therapy: WFL for tasks assessed/performed Overall Cognitive Status: Impaired/Different from baseline Area of Impairment: Attention;Memory;Following commands;Awareness;Problem solving                   Current Attention Level: Selective Memory: Decreased short-term memory Following Commands: Follows one step commands with increased time   Awareness: Emergent Problem Solving: Slow processing;Decreased initiation;Difficulty sequencing;Requires verbal cues;Requires tactile cues General Comments: Patient requiring additional cueing to complete tasks.  She moves very slowly.  Difficulty word finding.  Emotionally labile and tearful      General Comments General comments (skin integrity, edema, etc.): Able to viod and have a BM on the [facility]    Exercises     Assessment/Plan    PT Assessment Patient needs continued PT services  PT Problem List Decreased strength;Decreased range of motion;Decreased activity tolerance;Decreased balance;Decreased mobility;Decreased coordination;Decreased cognition;Decreased knowledge of use of DME;Decreased safety awareness;Decreased knowledge of precautions       PT Treatment Interventions DME instruction;Gait training;Functional mobility training;Therapeutic activities;Therapeutic exercise;Balance training;Neuromuscular re-education;Cognitive remediation;Patient/family education    PT Goals (Current goals can be found in the Care Plan section)  Acute Rehab PT Goals Patient Stated Goal: to go back home PT Goal Formulation: Patient unable to participate in goal setting Time For  Goal Achievement: 03/02/20 Potential to Achieve Goals: Good    Frequency Min 2X/week   Barriers to discharge        Co-evaluation               AM-PAC PT "6 Clicks" Mobility  Outcome Measure Help needed turning from your back to your side while in a flat bed without using bedrails?: A Little Help needed moving from lying on your back to sitting on the side of a flat bed without using bedrails?: A Little Help needed moving to and from a bed to a chair (including a wheelchair)?: A Lot Help needed standing up from a chair using your arms (e.g., wheelchair or bedside chair)?: A Lot Help needed to walk in hospital room?: A Lot Help needed climbing 3-5 steps with a railing? : Total 6 Click Score: 13    End of Session Equipment Utilized During Treatment: Gait belt Activity Tolerance: Patient tolerated treatment well Patient left: in chair;with call bell/phone within reach Nurse Communication: Mobility status PT Visit Diagnosis: Unsteadiness on feet (R26.81);Other abnormalities of gait and mobility (R26.89);Repeated falls (R29.6);Other symptoms and signs involving the nervous system (R29.898)    Time: 0931-1000 PT Time Calculation (min) (ACUTE ONLY): 29 min   Charges:   PT Evaluation $PT Eval Moderate Complexity: 1 Mod PT Treatments $Therapeutic Activity: 8-22 mins        Roney Marion, PT  Acute Rehabilitation Services Pager [phone] Office [phone]   Colletta [place] 02/17/2020, 3:07 PM

## 2020-02-18 LAB — CBC
HCT: 33.9 % — ABNORMAL LOW (ref 36.0–46.0)
Hemoglobin: 11.1 g/dL — ABNORMAL LOW (ref 12.0–15.0)
MCH: 28.5 pg (ref 26.0–34.0)
MCHC: 32.7 g/dL (ref 30.0–36.0)
MCV: 87.1 fL (ref 80.0–100.0)
Platelets: 233 10*3/uL (ref 150–400)
RBC: 3.89 MIL/uL (ref 3.87–5.11)
RDW: 13.7 % (ref 11.5–15.5)
WBC: 6.8 10*3/uL (ref 4.0–10.5)
nRBC: 0 % (ref 0.0–0.2)

## 2020-02-18 LAB — GLUCOSE, CAPILLARY
Glucose-Capillary: 138 mg/dL — ABNORMAL HIGH (ref 70–99)
Glucose-Capillary: 151 mg/dL — ABNORMAL HIGH (ref 70–99)
Glucose-Capillary: 207 mg/dL — ABNORMAL HIGH (ref 70–99)
Glucose-Capillary: 152 mg/dL — ABNORMAL HIGH (ref 70–99)

## 2020-02-18 LAB — BASIC METABOLIC PANEL
Anion gap: 10 (ref 5–15)
BUN: 18 mg/dL (ref 8–23)
CO2: 24 mmol/L (ref 22–32)
Calcium: 9.3 mg/dL (ref 8.9–10.3)
Chloride: 106 mmol/L (ref 98–111)
Creatinine, Ser: 0.75 mg/dL (ref 0.44–1.00)
GFR calc Af Amer: >60 mL/min (ref >60)
GFR calc non Af Amer: >60 mL/min (ref >60)
Glucose, Bld: 145 mg/dL — ABNORMAL HIGH (ref 70–99)
Potassium: 3.5 mmol/L (ref 3.5–5.1)
Sodium: 140 mmol/L (ref 135–145)

## 2020-02-18 LAB — SARS CORONAVIRUS 2 (TAT 6-24 HRS): SARS Coronavirus 2: NEGATIVE

## 2020-02-18 MED ORDER — LABETALOL HCL 5 MG/ML IV SOLN
5.0000 mg | INTRAVENOUS | Status: DC | PRN
  Administered 2020-02-19: 5 mg via INTRAVENOUS
  Filled 2020-02-18: qty 4

## 2020-02-18 MED ORDER — AMLODIPINE BESYLATE 10 MG PO TABS
10.0000 mg | ORAL_TABLET | Freq: Every day | ORAL | Status: DC
  Administered 2020-02-18 – 2020-02-19 (×2): 10 mg via ORAL
  Filled 2020-02-18 (×2): qty 1

## 2020-02-18 MED ORDER — AMLODIPINE BESYLATE 5 MG PO TABS: 5.0000 mg | ORAL_TABLET | Freq: Every day | ORAL | Status: DC

## 2020-02-18 MED ORDER — HYDROCHLOROTHIAZIDE 25 MG PO TABS
25.0000 mg | ORAL_TABLET | Freq: Every day | ORAL | Status: DC
  Administered 2020-02-18 – 2020-02-19 (×2): 25 mg via ORAL
  Filled 2020-02-18 (×2): qty 1

## 2020-02-18 NOTE — Progress Notes (Signed)
Physical Therapy Treatment Patient Details Name: Tasha [name] MRN: [id number] DOB: 06/19/1946 Today's Date: 02/18/2020    History of Present Illness Tasha [name] is an 74 y.o. female with a PMHx of HTN, MI, remote TBI causing seizures and prior stroke who presented to the ED via EMS with c/c of generalized weakness and dizziness since Tuesday, followed by a fall on Wednesday from which she could not get up. She was found by family on Sunday. BP per EMS was 244/120. MRI brain revealed a subcentimeter acute ischemic infarction in the posterior limb of the left internal capsule.     PT Comments    Continuing work on functional mobility and activity tolerance;  Able to initiate gait training with second person present for safety; Notable that she has sufficient power for sit to satnd; Significant dyscoordination and ataxia, with difficulty with weight shfiting fully into single limb stance, thereby making swing limb advancement very difficult; still, able to make progress, with far less posterior lean than last session; continue to recommend SNF for post-acute rehab  Follow Up Recommendations  SNF     Equipment Recommendations  Rolling walker with 5" wheels;3in1 (PT)    Recommendations for Other Services       Precautions / Restrictions Precautions Precautions: Fall    Mobility  Bed Mobility Overal bed mobility: Needs Assistance Bed Mobility: Supine to Sit     Supine to sit: Min assist     General bed mobility comments: Moves very slowly and able to do most of mobility with HOB raised and rails, needs help scooting forward and straightening upright  Transfers Overall transfer level: Needs assistance Equipment used: 2 person hand held assist Transfers: Sit to/from [employer] Sit to Stand: Mod assist Stand pivot transfers: Max assist       General transfer comment: Posterior bias; Trouble taking steps with R leg, moves slowly and gets  fatigued.  Ambulation/Gait Ambulation/Gait assistance: +2 physical assistance;Max assist Gait Distance (Feet): 8 Feet Assistive device: 2 person hand held assist Gait Pattern/deviations: Decreased step length - right;Decreased weight shift to left;Scissoring;Ataxic (erratic step width)     General Gait Details: Decr weight shift into single limb sance L and R (L more difficult) leading to very short steps and difficulty inwieghing swng LE for stepping; posterior bias better today with multimodal cues   Stairs             Wheelchair Mobility    Modified Rankin (Stroke Patients Only)       Balance     Sitting balance-Leahy Scale: Fair       Standing balance-Leahy Scale: Poor                              Cognition Arousal/Alertness: Awake/alert Behavior During Therapy: WFL for tasks assessed/performed Overall Cognitive Status: Impaired/Different from baseline Area of Impairment: Attention;Memory;Following commands;Awareness;Problem solving                   Current Attention Level: Selective Memory: Decreased short-term memory Following Commands: Follows one step commands with increased time   Awareness: Emergent Problem Solving: Slow processing;Decreased initiation;Difficulty sequencing;Requires verbal cues;Requires tactile cues General Comments: Patient requiring additional cueing to complete tasks.  She moves very slowly.  Difficulty word finding.  Emotionally labile and tearful      Exercises      General Comments        Pertinent Vitals/Pain Pain Assessment: Faces Faces Pain  Scale: Hurts a little bit Pain Location: R leg Pain Descriptors / Indicators: Sore Pain Intervention(s): Monitored during session    Home Living                      Prior Function            PT Goals (current goals can now be found in the care plan section) Acute Rehab PT Goals Patient Stated Goal: to go back home PT Goal Formulation:  Patient unable to participate in goal setting Time For Goal Achievement: 03/02/20 Potential to Achieve Goals: Good Progress towards PT goals: Progressing toward goals    Frequency    Min 2X/week      PT Plan Current plan remains appropriate    Co-evaluation              AM-PAC PT "6 Clicks" Mobility   Outcome Measure  Help needed turning from your back to your side while in a flat bed without using bedrails?: A Little Help needed moving from lying on your back to sitting on the side of a flat bed without using bedrails?: A Little Help needed moving to and from a bed to a chair (including a wheelchair)?: A Lot Help needed standing up from a chair using your arms (e.g., wheelchair or bedside chair)?: A Lot Help needed to walk in hospital room?: A Lot Help needed climbing 3-5 steps with a railing? : Total 6 Click Score: 13    End of Session Equipment Utilized During Treatment: Gait belt Activity Tolerance: Patient tolerated treatment well Patient left: in chair;with call bell/phone within reach Nurse Communication: Mobility status PT Visit Diagnosis: Unsteadiness on feet (R26.81);Other abnormalities of gait and mobility (R26.89);Repeated falls (R29.6);Other symptoms and signs involving the nervous system (R29.898)     Time: 5409-8119 PT Time Calculation (min) (ACUTE ONLY): 22 min  Charges:  $Gait Training: 8-22 mins                     Roney [place], [place]  Acute Rehabilitation Services Pager [phone] Office [phone]    [place] 02/18/2020, 4:13 PM

## 2020-02-18 NOTE — Discharge Summary (Addendum)
Name: Tasha [name] DOB: 04/20/1946 74 y.o. PCP: Patient, No Pcp Per  Date of Admission: 02/15/2020  4:00 PM Date of Discharge: 02/19/2020 Attending Physician: Earl LagosNarendra, Nischal, MD  Discharge Diagnosis: Principal Problem:   CVA (cerebral vascular accident) [facility](HCC) Active Problems:   Fall   Discharge Medications: Allergies as of 02/19/2020      Reactions   Allopurinol Shortness Of Breath   Tetracycline Anaphylaxis   Amoxicillin-pot Clavulanate Diarrhea   Atorvastatin Other (See Comments)   Unknown reaction - [facility] [place] [facility] 05/10/2012   Azithromycin Other (See Comments)   Caused blisters inside and out   Codeine Other (See Comments)   Altered mental status   Lisinopril Other (See Comments)   Unknown reaction - [facility] [place] [facility] 12/23/2008   Sulfa Antibiotics Diarrhea      Medication List    TAKE these medications   amLODipine 10 MG tablet Commonly known as: NORVASC Take 1 tablet (10 mg total) by mouth daily. Start taking on: February 20, 2020   aspirin 81 MG EC tablet Take 1 tablet (81 mg total) by mouth daily for 16 days. Swallow whole. Start taking on: February 20, 2020 What changed: when to take this   clopidogrel 75 MG tablet Commonly known as: PLAVIX Take 1 tablet (75 mg total) by mouth daily. Start taking on: February 20, 2020   hydrochlorothiazide 25 MG tablet Commonly known as: HYDRODIURIL Take 1 tablet (25 mg total) by mouth daily. Start taking on: February 20, 2020   loratadine 10 MG tablet Commonly known as: CLARITIN Take 10 mg by mouth daily as needed for allergies.   metFORMIN 1000 MG tablet Commonly known as: Glucophage Take 1 tablet (1,000 mg total) by mouth daily with breakfast.   rosuvastatin 20 MG tablet Commonly known as: CRESTOR Take 1 tablet (20 mg total) by mouth daily. Start taking on: February 20, 2020   senna-docusate 8.6-50 MG tablet Commonly known as: Senokot-S Take 1 tablet by mouth at bedtime as  needed for mild constipation.   VISINE OP Place 1 drop into both eyes daily as needed (red eyes).            Durable Medical Equipment  (From admission, onward)         Start     Ordered   02/18/20 1112  For home use only DME 3 n 1  Once        02/18/20 1111          Disposition and follow-up:   Ms.Tasha Arnoldo MoraleManley [facility] St[facility].  At the hospital follow up visit please address:  1.  Follow-up:  A. CVA - DAPT for 3 weeks (end 0711/21) then continue plavix, f/u MIR 3 month, neuro f/u 6 weeks    B. DM - started on Metformin titrate as needed   C. HTN - may need 3rd agent, has allergy to lisinopril. She cant remember the allergy and she just moved her without records. May benefit form Losartan if able.    2.  Labs / imaging needed at time of follow-up: Repeat A1c, CBC, BMP  3.  Pending labs/ test needing follow-up: none  Follow-up Appointments:  Follow-up Information    [facility]. Go on 02/26/2020.   Why: at 3:pm. Please arrive 15 minutes before your appointment.  Contact information: [address] [place] [id number] [phone]       [facility]. Schedule an appointment as soon as possible for a visit in 6 week(s).   Why: Please make an appointment for a 6 week follow up.  Contact information: [address]     [address] [place] [address] [phone]              Hospital Course by problem list: 1. CVA - presents after a fall. Patient reports she has been week for a few months now and uses a walker at home. She had a fall in her hallway trying to turn around on Wednesday and was down until her son found her today (Sunday). She denies lost of consciousness, pain , or injury. She was too weak to crawl or get up after falling. EMS was called and she was brought to the ED. Patient found to have stroke involving posterior limb of  left internal capsule and left parietal lobe and consulted neurology. Patient was started on DAPT for 3 weeks then plavix alone. Will need 3 month f/u MRI and a 6 week f/u with neuro.  2. HTN - patient presented with poorly controlled HTN and started on HCTZ 25 mg daily and amlodipine 10 mg daily. She will need close outpatient follow up. She will likely need to add another medications. She has an unknown reaction to lisinopril that she can't remember. Would benefit from starting losartan if allergic reaction allows.   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      Discharge Vitals:   BP (!) 162/74 (BP Location: Right Arm)   Pulse 86   Temp 98.1 F (36.7 C) (Oral)   Resp 18   Ht 5\' 1"  (1.549 m)   Wt 66.8 kg   SpO2 99%   BMI 27.83 kg/m   Pertinent Labs, Studies, and Procedures:  CBC Latest Ref Rng & Units 02/18/2020 02/17/2020 02/16/2020  WBC 4.0 - 10.5 K/uL 6.8 7.0 9.2  Hemoglobin 12.0 - 15.0 g/dL 11.1(L) 11.8(L) 12.5  Hematocrit 36 - 46 % 33.9(L) 35.2(L) 38.3  Platelets 150 - 400 K/uL 233 236 268    CMP Latest Ref Rng & Units 02/18/2020 02/17/2020 02/16/2020  Glucose 70 - 99 mg/dL 02/18/2020) 157(W) 620(B)  BUN 8 - 23 mg/dL 18 21 559(R)  Creatinine 0.44 - 1.00 mg/dL 41(U 3.84 5.36  Sodium 135 - 145 mmol/L 140 138 140  Potassium 3.5 - 5.1 mmol/L 3.5 3.4(L) 3.0(L)  Chloride 98 - 111 mmol/L 106 106 107  CO2 22 - 32 mmol/L 24 23 23   Calcium 8.9 - 10.3 mg/dL 9.3 9.2 9.3  Total Protein 6.5 - 8.1 g/dL - 5.9(L) -  Total Bilirubin 0.3 - 1.2 mg/dL - 0.9 -  Alkaline Phos 38 - 126 U/L - 46 -  AST 15 - 41 U/L - 14(L) -  ALT 0 - 44 U/L - 19 -    CT ANGIO HEAD W OR WO CONTRAST  Result Date: 02/16/2020 CLINICAL DATA:  Stroke follow-up. EXAM: CT ANGIOGRAPHY HEAD AND NECK TECHNIQUE: Multidetector CT imaging of the head and neck was performed using the standard protocol during bolus administration of intravenous contrast. Multiplanar CT  image reconstructions and MIPs were obtained to evaluate the vascular anatomy. Carotid stenosis measurements (when applicable) are obtained utilizing NASCET criteria, using the distal internal carotid diameter as the denominator. CONTRAST:  25mL OMNIPAQUE IOHEXOL 350 MG/ML SOLN COMPARISON:  None. FINDINGS: CTA NECK FINDINGS Aortic arch: Standard 3 vessel aortic  arch with mild atherosclerotic plaque. No significant arch vessel origin stenosis. Right carotid system: Patent with mild-to-moderate calcified plaque in the carotid bulb. No evidence of significant stenosis or dissection. Left carotid system: Patent with mild, predominantly calcified plaque at the carotid bifurcation. No evidence of significant stenosis or dissection. Vertebral arteries: Patent without evidence of significant stenosis or dissection. Both V2 segments are tortuous. Skeleton: Advanced left facet arthrosis at C3-4 and C4-5 with minimal anterolisthesis. Other neck: Multiple small right submandibular calculi measuring up to 3 mm in size. Dilatation of the intraglandular and proximal extraglandular submandibular ducts bilaterally without evidence of acute sialadenitis. No evidence of cervical lymphadenopathy or mass. Upper chest: Calcified right hilar lymph nodes.  Clear lung apices. Review of the MIP images confirms the above findings CTA HEAD FINDINGS Anterior circulation: The internal carotid arteries are patent from skull base to carotid termini with atherosclerotic plaque resulting in mild right and moderate left paraclinoid stenoses. There is a 2 mm posteriorly projecting aneurysm from the proximal left supraclinoid ICA. ACAs and MCAs are patent with moderate diffuse atherosclerotic type irregularity but no proximal branch occlusion. There is a mild to moderate distal right M1 stenosis, and there is minimal to mild left M1 narrowing. Posterior circulation: The intracranial vertebral arteries are patent to the basilar with the right being  dominant. There is relatively diffuse atherosclerotic irregularity of both V4 segments with up to mild stenosis. The basilar artery is patent with diffuse irregularity and a mild stenosis proximally. Posterior communicating arteries are diminutive or absent. The PCAs are patent with extensive atherosclerotic type irregularity diffusely. There are moderate right P1, moderate left P1-P2 junction, and severe right P2 stenoses. No aneurysm is identified. Venous sinuses: As permitted by contrast timing, patent. Anatomic variants: None. Review of the MIP images confirms the above findings IMPRESSION: 1. Widespread intracranial atherosclerosis without major branch occlusion. 2. Mild right and moderate left paraclinoid ICA stenoses. 3. Mild to moderate right M1 and severe right P2 stenoses. 4. 2 mm left supraclinoid ICA aneurysm. 5. Widely patent cervical carotid and vertebral arteries. 6. Aortic Atherosclerosis (ICD10-I70.0). Electronically Signed   By: Logan Bores M.D.   On: 02/16/2020 08:44   CT Head Wo Contrast  Result Date: 02/15/2020 CLINICAL DATA:  Subacute neuro deficit. Dizziness. Fall a few days ago. EXAM: CT HEAD WITHOUT CONTRAST TECHNIQUE: Contiguous axial images were obtained from the base of the skull through the vertex without intravenous contrast. COMPARISON:  Jan 02, 2020 FINDINGS: Brain: No subdural, epidural, or subarachnoid hemorrhage. Ventricles and sulci are prominent but stable. Moderate white matter changes are stable. Scattered lacunar infarcts are stable. No acute cortical ischemia or infarct. No mass effect or midline shift. A focus of high attenuation in the right cerebellum on coronal image 47 correlates with a region of susceptibility artifact on an MRI from Jan 02, 2020 and likely represents either calcification or nonacute blood. This does not appear to be an acute finding based on the comparison MRI. Vascular: Calcified atherosclerosis in the intracranial carotids. Skull: Normal. Negative  for fracture or focal lesion. Sinuses/Orbits: No acute finding. Other: None. IMPRESSION: No acute intracranial abnormalities. Chronic white matter changes and lacunar infarcts. Electronically Signed   By: Dorise Bullion III M.D   On: 02/15/2020 17:50   CT ANGIO NECK W OR WO CONTRAST  Result Date: 02/16/2020 CLINICAL DATA:  Stroke follow-up. EXAM: CT ANGIOGRAPHY HEAD AND NECK TECHNIQUE: Multidetector CT imaging of the head and neck was performed using the standard protocol during bolus  administration of intravenous contrast. Multiplanar CT image reconstructions and MIPs were obtained to evaluate the vascular anatomy. Carotid stenosis measurements (when applicable) are obtained utilizing NASCET criteria, using the distal internal carotid diameter as the denominator. CONTRAST:  75mL OMNIPAQUE IOHEXOL 350 MG/ML SOLN COMPARISON:  None. FINDINGS: CTA NECK FINDINGS Aortic arch: Standard 3 vessel aortic arch with mild atherosclerotic plaque. No significant arch vessel origin stenosis. Right carotid system: Patent with mild-to-moderate calcified plaque in the carotid bulb. No evidence of significant stenosis or dissection. Left carotid system: Patent with mild, predominantly calcified plaque at the carotid bifurcation. No evidence of significant stenosis or dissection. Vertebral arteries: Patent without evidence of significant stenosis or dissection. Both V2 segments are tortuous. Skeleton: Advanced left facet arthrosis at C3-4 and C4-5 with minimal anterolisthesis. Other neck: Multiple small right submandibular calculi measuring up to 3 mm in size. Dilatation of the intraglandular and proximal extraglandular submandibular ducts bilaterally without evidence of acute sialadenitis. No evidence of cervical lymphadenopathy or mass. Upper chest: Calcified right hilar lymph nodes.  Clear lung apices. Review of the MIP images confirms the above findings CTA HEAD FINDINGS Anterior circulation: The internal carotid arteries are  patent from skull base to carotid termini with atherosclerotic plaque resulting in mild right and moderate left paraclinoid stenoses. There is a 2 mm posteriorly projecting aneurysm from the proximal left supraclinoid ICA. ACAs and MCAs are patent with moderate diffuse atherosclerotic type irregularity but no proximal branch occlusion. There is a mild to moderate distal right M1 stenosis, and there is minimal to mild left M1 narrowing. Posterior circulation: The intracranial vertebral arteries are patent to the basilar with the right being dominant. There is relatively diffuse atherosclerotic irregularity of both V4 segments with up to mild stenosis. The basilar artery is patent with diffuse irregularity and a mild stenosis proximally. Posterior communicating arteries are diminutive or absent. The PCAs are patent with extensive atherosclerotic type irregularity diffusely. There are moderate right P1, moderate left P1-P2 junction, and severe right P2 stenoses. No aneurysm is identified. Venous sinuses: As permitted by contrast timing, patent. Anatomic variants: None. Review of the MIP images confirms the above findings IMPRESSION: 1. Widespread intracranial atherosclerosis without major branch occlusion. 2. Mild right and moderate left paraclinoid ICA stenoses. 3. Mild to moderate right M1 and severe right P2 stenoses. 4. 2 mm left supraclinoid ICA aneurysm. 5. Widely patent cervical carotid and vertebral arteries. 6. Aortic Atherosclerosis (ICD10-I70.0). Electronically Signed   By: Sebastian Ache M.D.   On: 02/16/2020 08:44   MR BRAIN W WO CONTRAST  Result Date: 02/15/2020 CLINICAL DATA:  Initial evaluation for acute encephalopathy. EXAM: MRI HEAD WITHOUT AND WITH CONTRAST TECHNIQUE: Multiplanar, multiecho pulse sequences of the brain and surrounding structures were obtained without and with intravenous contrast. CONTRAST:  6mL GADAVIST GADOBUTROL 1 MMOL/ML IV SOLN COMPARISON:  Comparison made with prior head CT  from earlier the same day as well as recent brain MRI from 01/02/2020. FINDINGS: Brain: Diffuse prominence of the CSF containing spaces compatible with generalized age-related cerebral atrophy. Moderate to advanced chronic microvascular ischemic disease again noted involving the periventricular and deep white matter both cerebral hemispheres, with patchy involvement of the pons and brainstem. Multiple scattered superimposed remote lacunar infarcts present about the bilateral basal ganglia and thalami. 8 mm focus of restricted diffusion seen centered at the posterior limb of the left internal capsule (series 5, image 78) consistent with an acute ischemic infarct. There is an additional 5 mm focus of vague diffusion abnormality involving  the subcortical left parietal lobe without ADC correlate, likely an evolving subacute ischemic infarct (series 5, image 87). No associated hemorrhage or mass effect about these ischemic changes. No evidence for acute intracranial hemorrhage. Few scattered chronic micro hemorrhages noted involving the right cerebellum, right thalamus, and left periatrial white matter, most likely related to underlying hypertension/small vessel disease. Single 7 mm focus of curvilinear enhancement seen involving the left parietal gray matter (series 18, image 37), new from recent brain MRI, but favored to be related to an evolving subacute ischemic infarct given the close proximity to the above-mentioned mild diffusion abnormality. No other foci of abnormal enhancement. Previously described stippled enhancement seen involving the brainstem and periaqueductal gray matter is now perhaps only faintly visualized, decreased in prominence from prior exam. Finding felt to be have been related to a subacute ischemic lacunar type infarct, now more chronic and evident on today's exam (series 15, image 12 on T2 weighted sequence). No other abnormal enhancement. No mass lesion, midline shift, or mass effect.  Diffuse ventricular prominence related to global parenchymal volume loss without hydrocephalus. No extra-axial fluid collection. Partially empty sella noted. Midline structures intact. Vascular: Major intracranial vascular flow voids are maintained. Skull and upper cervical spine: Craniocervical junction within normal limits. Bone marrow signal intensity normal. No scalp soft tissue abnormality. Sinuses/Orbits: Globes and orbital soft tissues within normal limits. Mild scattered mucosal thickening noted within the ethmoidal air cells and maxillary sinuses. Trace right mastoid effusion noted, of doubtful significance. Inner ear structures grossly normal. Other: None. IMPRESSION: 1. 8 mm acute ischemic nonhemorrhagic infarct involving the posterior limb of the left internal capsule. 2. Additional 5 mm focus of vague diffusion abnormality involving the subcortical left parietal lobe, likely an evolving subacute ischemic infarct. 3. 7 mm focus of curvilinear enhancement involving the left parietal cortex, new as compared to recent brain MRI, and favored to be secondary to evolving subacute ischemia given the close proximity to the above mention diffusion abnormality. Short interval follow-up MRI in 3 months is suggested to ensure these changes resolve. 4. Near complete interval resolution of previously seen stippled enhancement involving the periaqueductal gray matter/brainstem. Finding felt to have been related to an evolving subacute ischemic infarct on prior exam, now more chronic and evident on today's exam. 5. Underlying age-related cerebral atrophy with fairly advanced chronic microvascular ischemic disease. Electronically Signed   By: Rise Mu M.D.   On: 02/15/2020 21:39   ECHOCARDIOGRAM COMPLETE  Result Date: 02/16/2020    ECHOCARDIOGRAM REPORT   Patient Name:   NATASIA SANKO Date of Exam: 02/16/2020 Medical Rec #:  [id number]    Height:       61.0 in Accession #:    [id number]   Weight:       144.0  lb Date of Birth:  1945/09/06     BSA:          1.643 m Patient Age:    73 years     BP:           190/91 mmHg Patient Gender: F            HR:           82 bpm. Exam Location:  Inpatient Procedure: 2D Echo Indications:    stroke 434.91  History:        Patient has no prior history of Echocardiogram examinations.                 Previous Myocardial Infarction, Stroke; Risk  Factors:Hypertension.  Sonographer:    Celene Skeen RDCS (AE) Referring Phys: [id number] NISCHAL NARENDRA IMPRESSIONS  1. Vigorous LV systolic function; grade 1 diastolic dysfunction; mild LVH.  2. Left ventricular ejection fraction, by estimation, is 70 to 75%. The left ventricle has hyperdynamic function. The left ventricle has no regional wall motion abnormalities. There is mild left ventricular hypertrophy. Left ventricular diastolic parameters are consistent with Grade I diastolic dysfunction (impaired relaxation).  3. Right ventricular systolic function is normal. The right ventricular size is normal.  4. The mitral valve is normal in structure. No evidence of mitral valve regurgitation. No evidence of mitral stenosis.  5. The aortic valve is tricuspid. Aortic valve regurgitation is not visualized. Mild aortic valve sclerosis is present, with no evidence of aortic valve stenosis.  6. The inferior vena cava is normal in size with greater than 50% respiratory variability, suggesting right atrial pressure of 3 mmHg. FINDINGS  Left Ventricle: Left ventricular ejection fraction, by estimation, is 70 to 75%. The left ventricle has hyperdynamic function. The left ventricle has no regional wall motion abnormalities. The left ventricular internal cavity size was normal in size. There is mild left ventricular hypertrophy. Left ventricular diastolic parameters are consistent with Grade I diastolic dysfunction (impaired relaxation). Right Ventricle: The right ventricular size is normal.Right ventricular systolic function is normal. Left  Atrium: Left atrial size was normal in size. Right Atrium: Right atrial size was normal in size. Pericardium: There is no evidence of pericardial effusion. Mitral Valve: The mitral valve is normal in structure. Normal mobility of the mitral valve leaflets. Mild mitral annular calcification. No evidence of mitral valve regurgitation. No evidence of mitral valve stenosis. Tricuspid Valve: The tricuspid valve is normal in structure. Tricuspid valve regurgitation is trivial. No evidence of tricuspid stenosis. Aortic Valve: The aortic valve is tricuspid. Aortic valve regurgitation is not visualized. Mild aortic valve sclerosis is present, with no evidence of aortic valve stenosis. Pulmonic Valve: The pulmonic valve was not well visualized. Pulmonic valve regurgitation is not visualized. No evidence of pulmonic stenosis. Aorta: The aortic root is normal in size and structure. Venous: The inferior vena cava is normal in size with greater than 50% respiratory variability, suggesting right atrial pressure of 3 mmHg. IAS/Shunts: No atrial level shunt detected by color flow Doppler. Additional Comments: Vigorous LV systolic function; grade 1 diastolic dysfunction; mild LVH.  LEFT VENTRICLE PLAX 2D LVIDd:         3.70 cm  Diastology LVIDs:         2.40 cm  LV e' lateral:   7.51 cm/s LV PW:         1.10 cm  LV E/e' lateral: 9.7 LV IVS:        1.20 cm  LV e' medial:    7.18 cm/s LVOT diam:     1.80 cm  LV E/e' medial:  10.1 LV SV:         48 LV SV Index:   29 LVOT Area:     2.54 cm  RIGHT VENTRICLE RV S prime:     12.50 cm/s TAPSE (M-mode): 1.6 cm LEFT ATRIUM           Index LA diam:      3.70 cm 2.25 cm/m LA Vol (A2C): 35.7 ml 21.73 ml/m  AORTIC VALVE LVOT Vmax:   92.70 cm/s LVOT Vmean:  65.200 cm/s LVOT VTI:    0.188 m  AORTA Ao Root diam: 3.00 cm MITRAL VALVE MV Area (PHT): 3.12  cm     SHUNTS MV Decel Time: 243 msec     Systemic VTI:  0.19 m MV E velocity: 72.80 cm/s   Systemic Diam: 1.80 cm MV A velocity: 101.00 cm/s MV  E/A ratio:  0.72 Olga Millers MD Electronically signed by Olga Millers MD Signature Date/Time: 02/16/2020/12:06:57 PM    Final      Discharge Instructions: Discharge Instructions    Diet - low sodium heart healthy   Complete by: As directed    Increase activity slowly   Complete by: As directed       Signed: Dellia Cloud, MD 02/19/2020, 3:28 PM   Pager: [phone]

## 2020-02-18 NOTE — TOC Initial Note (Signed)
Transition of Care [facility]) - Initial/Assessment Note    Patient Details  Name: Tasha [name] MRN: [id number] Date of Birth: [birth year]  Transition of Care [facility]) CM/SW Contact:    Lawerance Sabal, RN Phone Number: 02/18/2020, 9:44 AM  Clinical Narrative:                SPoke w patient at bedside. SHe states that she has Medicare A, not B or any prescription coverage.  She moved here from [facility] about 8 months ago.  She would like SNf at [place], she states her family is in agreement.   Expected Discharge Plan: Skilled Nursing Facility Barriers to Discharge: Continued Medical Work up   Patient Goals and CMS Choice Patient states their goals for this hospitalization and ongoing recovery are:: to go to rehab      Expected Discharge Plan and Services Expected Discharge Plan: Skilled Nursing Facility     Post Acute Care Choice: Nursing Home                                        Prior Living Arrangements/Services                       Activities of Daily Living Home Assistive Devices/Equipment: None ADL Screening (condition at time of admission) Patient's cognitive ability adequate to safely complete daily activities?: Yes Is the patient deaf or have difficulty hearing?: No Does the patient have difficulty seeing, even when wearing glasses/contacts?: No Does the patient have difficulty concentrating, remembering, or making decisions?: No Patient able to express need for assistance with ADLs?: No Does the patient have difficulty dressing or bathing?: No Independently performs ADLs?: Yes (appropriate for developmental age) Does the patient have difficulty walking or climbing stairs?: No Weakness of Legs: None Weakness of Arms/Hands: None  Permission Sought/Granted                  Emotional Assessment              Admission diagnosis:  Lower urinary tract infectious disease [N39.0] Fall [W19.XXXA] CVA (cerebral vascular accident) [facility])  [I63.9] Patient Active Problem List   Diagnosis Date Noted  . Fall 02/15/2020  . CVA (cerebral vascular accident) (HCC) 02/15/2020   PCP:  Patient, No Pcp Per Pharmacy:   [facility] [phone] - [place], [place] - [phone] AT [facility] [address] [place] [address] Phone: [phone] Fax: [phone]     Social Determinants of Health (SDOH) Interventions    Readmission Risk Interventions No flowsheet data found.

## 2020-02-18 NOTE — Plan of Care (Signed)
  Problem: Education: Goal: Knowledge of General Education information will improve Description: Including pain rating scale, medication(s)/side effects and non-pharmacologic comfort measures Outcome: Progressing   Problem: Clinical Measurements: Goal: Will remain free from infection Outcome: Progressing Goal: Respiratory complications will improve Outcome: Progressing   Problem: Nutrition: Goal: Adequate nutrition will be maintained Outcome: Progressing   

## 2020-02-18 NOTE — TOC Progression Note (Signed)
Transition of Care [facility]) - Progression Note    Patient Details  Name: Tasha [name] MRN: [id number] Date of Birth: 12-28-1945  Transition of Care [facility]) CM/SW Contact  Doy Hutching, [place] Phone Number: 02/18/2020, 3:32 PM  Clinical Narrative:    CSW has faxed pt referral out, await SNF offers. Pt will need new COVID swab. Have reached out to Dr. Marchia Bond for this order.   Expected Discharge Plan: Skilled Nursing Facility Barriers to Discharge: Continued Medical Work up  Expected Discharge Plan and Services Expected Discharge Plan: Skilled Nursing Facility Post Acute Care Choice: Nursing Home  Readmission Risk Interventions No flowsheet data found.
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
## 2020-02-18 NOTE — NC FL2 (Signed)
[place] MEDICAID FL2 LEVEL OF CARE SCREENING TOOL     IDENTIFICATION  Patient Name: Tasha [name] Birthdate: 19-Jan-1946 Sex: female Admission Date (Current Location): 02/15/2020  [facility] and [place] Number:  Producer, television/film/video and Address:  [facility], [address], [place], [place] [address]      Provider Number: [id number]  Attending Physician Name and Address:  Earl Lagos, MD  Relative Name and Phone Number:       Current Level of Care: Hospital Recommended Level of Care: Skilled Nursing Facility Prior Approval Number:    Date Approved/Denied:   PASRR Number: [id number]  Discharge Plan: SNF    Current Diagnoses: Patient Active Problem List   Diagnosis Date Noted  . Fall 02/15/2020  . CVA (cerebral vascular accident) (HCC) 02/15/2020    Orientation RESPIRATION BLADDER Height & Weight     Time, Self, Situation, Place  Normal Incontinent Weight: 147 lb 4.3 oz (66.8 kg) Height:  5\' 1"  (154.9 cm)  BEHAVIORAL SYMPTOMS/MOOD NEUROLOGICAL BOWEL NUTRITION STATUS      Continent Diet (see discharge summary)  AMBULATORY STATUS COMMUNICATION OF NEEDS Skin   Extensive Assist Verbally Normal                       Personal Care Assistance Level of Assistance  Feeding, Bathing, Dressing Bathing Assistance: Maximum assistance Feeding assistance: Independent Dressing Assistance: Maximum assistance     Functional Limitations Info  Hearing, Sight, Speech Sight Info: Adequate Hearing Info: Adequate Speech Info: Adequate    SPECIAL CARE FACTORS FREQUENCY  OT (By licensed OT), PT (By licensed PT)     PT Frequency: 5x week OT Frequency: 5x week            Contractures Contractures Info: Not present    Additional Factors Info  Code Status, Allergies, Insulin Sliding Scale Code Status Info: Full Code Allergies Info: Allopurinol, Tetracycline, Amoxicillin-pot Clavulanate, Atorvastatin, Azithromycin, Codeine, Lisinopril,  Sulfa Antibiotics   Insulin Sliding Scale Info: insulin aspart (novoLOG) injection 0-15 Units 3x daily with meals       Current Medications (02/18/2020):  This is the current hospital active medication list Current Facility-Administered Medications  Medication Dose Route Frequency Provider Last Rate Last Admin  . acetaminophen (TYLENOL) tablet 650 mg  650 mg Oral Q4H PRN Bloomfield, Carley D, DO       Or  . acetaminophen (TYLENOL) 160 MG/5ML solution 650 mg  650 mg Per Tube Q4H PRN Bloomfield, Carley D, DO       Or  . acetaminophen (TYLENOL) suppository 650 mg  650 mg Rectal Q4H PRN Bloomfield, Carley D, DO      . amLODipine (NORVASC) tablet 10 mg  10 mg Oral Daily Seawell, Jaimie A, DO   10 mg at 02/18/20 1017  . aspirin EC tablet 81 mg  81 mg Oral Daily 02/20/20, MD   81 mg at 02/18/20 1017  . clopidogrel (PLAVIX) tablet 75 mg  75 mg Oral Daily 02/20/20 L, NP   75 mg at 02/18/20 1017  . enoxaparin (LOVENOX) injection 40 mg  40 mg Subcutaneous Q24H Bloomfield, Carley D, DO   40 mg at 02/17/20 2028  . hydrochlorothiazide (HYDRODIURIL) tablet 25 mg  25 mg Oral Daily 2029, MD   25 mg at 02/18/20 1017  . insulin aspart (novoLOG) injection 0-15 Units  0-15 Units Subcutaneous TID WC Bloomfield, Carley D, DO   3 Units at 02/18/20 1127  . labetalol (  NORMODYNE) injection 5 mg  5 mg Intravenous Q2H PRN Seawell, Jaimie A, DO      . rosuvastatin (CRESTOR) tablet 20 mg  20 mg Oral Daily Bloomfield, Carley D, DO   20 mg at 02/18/20 1017  . senna-docusate (Senokot-S) tablet 1 tablet  1 tablet Oral QHS PRN Modena Nunnery D, DO         Discharge Medications: Please see discharge summary for a list of discharge medications.  Relevant Imaging Results:  Relevant Lab Results:   Additional Information SS#[id number]  [place]

## 2020-02-19 LAB — GLUCOSE, CAPILLARY
Glucose-Capillary: 130 mg/dL — ABNORMAL HIGH (ref 70–99)
Glucose-Capillary: 167 mg/dL — ABNORMAL HIGH (ref 70–99)
Glucose-Capillary: 207 mg/dL — ABNORMAL HIGH (ref 70–99)
Glucose-Capillary: 171 mg/dL — ABNORMAL HIGH (ref 70–99)

## 2020-02-19 MED ORDER — ASPIRIN 81 MG PO TBEC: 81.0000 mg | DELAYED_RELEASE_TABLET | Freq: Every day | ORAL | 0 refills | Status: AC

## 2020-02-19 MED ORDER — HYDROCHLOROTHIAZIDE 25 MG PO TABS: 25.0000 mg | ORAL_TABLET | Freq: Every day | ORAL | 0 refills | Status: DC

## 2020-02-19 MED ORDER — METFORMIN HCL 1000 MG PO TABS: 1000.0000 mg | ORAL_TABLET | Freq: Every day | ORAL | 0 refills | Status: DC

## 2020-02-19 MED ORDER — CLOPIDOGREL BISULFATE 75 MG PO TABS: 75.0000 mg | ORAL_TABLET | Freq: Every day | ORAL | 0 refills | Status: DC

## 2020-02-19 MED ORDER — ROSUVASTATIN CALCIUM 20 MG PO TABS: 20.0000 mg | ORAL_TABLET | Freq: Every day | ORAL | 0 refills | Status: DC

## 2020-02-19 MED ORDER — AMLODIPINE BESYLATE 10 MG PO TABS: 10.0000 mg | ORAL_TABLET | Freq: Every day | ORAL | 0 refills | Status: DC

## 2020-02-19 MED ORDER — SENNOSIDES-DOCUSATE SODIUM 8.6-50 MG PO TABS: 1.0000 | ORAL_TABLET | Freq: Every evening | ORAL | 0 refills | Status: DC | PRN

## 2020-02-19 NOTE — Progress Notes (Signed)
Report given to Nurse Cassandra at [facility] VSS. Patient awaiting for [facility].
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
## 2020-02-19 NOTE — TOC Progression Note (Signed)
Transition of Care [facility]) - Progression Note    Patient Details  Name: Tasha [name] MRN: [id number] Date of Birth: 1946-06-25  Transition of Care [facility]) CM/SW Contact  Mearl Latin, LCSW Phone Number: 02/19/2020, 3:18 PM  Clinical Narrative:    CSW spoke with patient's son regarding SNF offers as requested by patient. Brad selected [facility]. [facility] able to accept patient today.   CSW spoke with patient and she is in agreement for [facility] today. MD aware.   Expected Discharge Plan: Skilled Nursing Facility Barriers to Discharge: Continued Medical Work up  Expected Discharge Plan and Services Expected Discharge Plan: Skilled Nursing Facility     Post Acute Care Choice: Nursing Home                                         Social Determinants of Health (SDOH) Interventions    Readmission Risk Interventions No flowsheet data found.

## 2020-02-19 NOTE — Discharge Instructions (Signed)
You were hospitalized for CVA (cerebral vascular accident) ([place]). Thank you for allowing [place] to be part of your care.    We arranged for you to follow up at:   Follow-up Information    [facility] on 02/26/2020.   Why: at 3:pm. Please arrive 15 minutes before your appointment.  Contact information: [address] [place] [phone]       [facility]. Schedule an appointment as soon as possible for a visit in 6 week(s).   Why: Please make an appointment for a 6 week follow up.  Contact information: [address]     [place] [address] [phone]              Please note these changes made to your medications:  Allergies as of 02/19/2020      Reactions   Allopurinol Shortness Of Breath   Tetracycline Anaphylaxis   Amoxicillin-pot Clavulanate Diarrhea   Atorvastatin Other (See Comments)   Unknown reaction - reported by [facility] [place] clinic 05/10/2012   Azithromycin Other (See Comments)   Caused blisters inside and out   Codeine Other (See Comments)   Altered mental status   Lisinopril Other (See Comments)   Unknown reaction - reported by [facility] [place] clinic 12/23/2008   Sulfa Antibiotics Diarrhea      Medication List    TAKE these medications   amLODipine 10 MG tablet Commonly known as: NORVASC Take 1 tablet (10 mg total) by mouth daily. Start taking on: February 20, 2020   aspirin 81 MG EC tablet Take 1 tablet (81 mg total) by mouth daily for 16 days. Swallow whole. Start taking on: February 20, 2020 What changed: when to take this   clopidogrel 75 MG tablet Commonly known as: PLAVIX Take 1 tablet (75 mg total) by mouth daily. Start taking on: February 20, 2020   hydrochlorothiazide 25 MG tablet Commonly known as: HYDRODIURIL Take 1 tablet (25 mg total) by mouth daily. Start taking on: February 20, 2020   loratadine 10 MG tablet Commonly known as: CLARITIN Take 10  mg by mouth daily as needed for allergies.   metFORMIN 1000 MG tablet Commonly known as: Glucophage Take 1 tablet (1,000 mg total) by mouth daily with breakfast.   rosuvastatin 20 MG tablet Commonly known as: CRESTOR Take 1 tablet (20 mg total) by mouth daily. Start taking on: February 20, 2020   senna-docusate 8.6-50 MG tablet Commonly known as: Senokot-S Take 1 tablet by mouth at bedtime as needed for mild constipation.   VISINE OP Place 1 drop into both eyes daily as needed (red eyes).            Durable Medical Equipment  (From admission, onward)         Start     Ordered   02/18/20 1112  For home use only DME 3 n 1  Once        02/18/20 1111           Please make sure to follow up with your doctors.   Please call our clinic if you have any questions or concerns, we may be able to help and keep you from a long and expensive emergency room wait. Our clinic and after hours phone number is [phone], the best time to call is Monday through Friday 9 am to 4 pm but there is always someone available 24/7 if you have an emergency. If you need  medication refills please notify your pharmacy one week in advance and they will send [place] a request.

## 2020-02-19 NOTE — TOC Transition Note (Signed)
Transition of Care [facility]) - CM/SW Discharge Note   Patient Details  Name: Tasha [name] MRN: [id number] Date of Birth: February 03, 1946  Transition [facility] Care [facility]) CM/SW Contact:  Mearl Latin, LCSW Phone Number: 02/19/2020, 4:01 PM   Clinical Narrative:    Patient will DC to: [facility] Anticipated DC date: 02/19/20 Family notified: Son, [name] Transport by: Sharin Mons   Per MD patient ready for DC to [facility]. RN, patient, patient's family, and facility notified of DC. Discharge Summary and FL2 sent to facility. RN to call report prior to discharge [phone] or [phone]). DC packet on chart. Ambulance transport requested for patient.   CSW will sign off for now as social work intervention is no longer needed. Please consult [place] again if new needs arise.      Final next level of care: Skilled Nursing Facility Barriers to Discharge: No Barriers Identified   Patient Goals and CMS Choice Patient states their goals for this hospitalization and ongoing recovery are:: to go to rehab CMS Medicare.gov Compare Post Acute Care list provided to:: Patient Choice offered to / list presented to : Patient, Adult Children  Discharge Placement   Existing PASRR number confirmed : 02/19/20          Patient [facility] bed at: [facility] Patient to be transferred to facility by: [facility] Name of family member notified: Son, [name] Patient and family notified of of transfer: 02/19/20  Discharge Plan and Services     Post Acute Care Choice: Nursing Home                               Social Determinants of Health (SDOH) Interventions     Readmission Risk Interventions No flowsheet data found.

## 2020-02-19 NOTE — TOC Progression Note (Addendum)
Transition of Care [facility]) - Progression Note    Patient Details  Name: Tasha [name] MRN: [id number] Date of Birth: December 19, 1945  Transition of Care [facility]) CM/SW Contact  Doy Hutching, [place] Phone Number: 02/19/2020, 12:06 PM  Clinical Narrative:    Provided pt with CMS ratings for SNF offers. CSW explained SNF process. Pt understands, wants to dicuss offers with her son. Encouraged her to make decision as soon as she can. Pt has had both COVID vaccines. COVID is negative. Pt tearful, provided support and encouragement. Explained that a colleague would likely f/u with pt today.    Expected Discharge Plan: Skilled Nursing Facility Barriers to Discharge: Continued Medical Work up  Expected Discharge Plan and Services Expected Discharge Plan: Skilled Nursing Facility Post Acute Care Choice: Nursing Home  Readmission Risk Interventions No flowsheet data found.

## 2020-02-19 NOTE — Hospital Course (Signed)
CVA - presents after a fall. Patient reports she has been week for a few months now and uses a walker at home. She had a fall in her hallway trying to turn around on Wednesday and was down until her son found her today (Sunday). She denies lost of consciousness, pain , or injury. She was too weak to crawl or get up after falling. EMS was called and she was brought to the ED. Patient found to have stroke involving posterior limb of left internal capsule and left parietal lobe and consulted neurology. Patient was started on DAPT for 3 weeks then plavix alone. Will need 3 month f/u MRI and a 6 week f/u with neuro.  HTN - patient presented with poorly controlled HTN and started on HCTZ 25 mg daily and amlodipine 10 mg daily. She will need close outpatient follow up. She will likely need to add another medications. She has an unknown reaction to lisinopril that she can't remember. Would benefit from starting losartan if allergic reaction allows.   TIIDM: - found to have an elevated A1c of 6.8 and started on Metformin 1000 mg daily at discharge.
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
## 2020-02-20 ENCOUNTER — Encounter: Payer: Self-pay | Admitting: Internal Medicine

## 2020-02-20 ENCOUNTER — Non-Acute Institutional Stay (SKILLED_NURSING_FACILITY): Payer: Self-pay | Admitting: Internal Medicine

## 2020-02-20 DIAGNOSIS — I639 Cerebral infarction, unspecified: Secondary | ICD-10-CM

## 2020-02-20 DIAGNOSIS — I10 Essential (primary) hypertension: Secondary | ICD-10-CM

## 2020-02-20 DIAGNOSIS — E785 Hyperlipidemia, unspecified: Secondary | ICD-10-CM

## 2020-02-20 DIAGNOSIS — I700 Atherosclerosis of aorta: Secondary | ICD-10-CM

## 2020-02-20 DIAGNOSIS — E1151 Type 2 diabetes mellitus with diabetic peripheral angiopathy without gangrene: Secondary | ICD-10-CM | POA: Insufficient documentation

## 2020-02-20 NOTE — Assessment & Plan Note (Addendum)
[facility] follow-up 6 weeks post discharge. Risk factors for recurrent stroke discussed.

## 2020-02-20 NOTE — Patient Instructions (Signed)
See assessment and plan under each diagnosis in the problem list and acutely for this visit 

## 2020-02-20 NOTE — Assessment & Plan Note (Signed)
Adjust meds at SNF based on glucose monitor

## 2020-02-20 NOTE — Progress Notes (Signed)
NURSING HOME LOCATION:  [facility] ROOM NUMBER:  [id number]  CODE STATUS:  FULL CODE  PCP:  Patient, No Pcp Per  No address on file  This is a comprehensive admission note to [place] [facility] performed on this date less than 30 days from date of admission. Included are preadmission medical/surgical history; reconciled medication list; family history; social history and comprehensive review of systems.  Corrections and additions to the records were documented. Comprehensive physical exam was also performed. Additionally a clinical summary was entered for each active diagnosis pertinent to this admission in the Problem List to enhance continuity of care.  HPI: The patient was hospitalized 6/20-6/24/2021.  The patient presented after a fall attempting to turn around in the hallway while using her walker.  She was too weak to crawl or get up after falling.  Apparently she was found 4 days after original fall by her son who lives 59 minutes away in [place]. Imaging revealed a stroke involving the posterior limb of the left internal capsule and left parietal lobe with widespread intracranial atherosclerosis.  Neurology initiated DAPT x3 weeks total followed by Plavix alone.. Hypertension control was poor at presentation.  Imaging documented atherosclerosis.  HCTZ 25 mg and amlodipine 10 mg were added.  By history she was intolerant to lisinopril.Apparently this was a dry cough. There isa no PMH of angioedema. Metformin 1000 mg was initiated for diabetes; A1c was 6.8%.  Glucoses range from 145-195. Dual antiplatelet therapy was to be continued for 3 weeks with cessation 03/07/2020 with subsequent continuation of Plavix.  [facility] follow-up was to be in 6 weeks.  Past medical and surgical history: Includes history of  myocardial infarction in 2005, dyslipidemia and essential hypertension.  By history she also had intolerance to  Atorvastatin. Surgeries include back surgery and abdominal  hysterectomy.  Social history: Occasional alcoholic beverage.  Never smoked.  [place] has relocated [place] from the [place], [place] area.  Her son lives in [place] and her daughter in [name].  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  Family history: Limited history reviewed.  She was adopted.  She states that her birth mother did have a heart attack.  Review of systems: She is oriented and provides an adequate history except for allergies.  She denies any active cardiopulmonary symptoms.  She states that at one time she was told she snored but there is no diagnosis of OSA.  Constitutional: No fever, significant weight change  Eyes: No redness, discharge, pain, vision change ENT/mouth: No nasal congestion, purulent discharge, earache, change in hearing, sore throat  Cardiovascular: No chest pain, palpitations, paroxysmal nocturnal dyspnea, claudication, edema  Respiratory: No cough, sputum production, hemoptysis, DOE, apnea  Gastrointestinal: No heartburn, dysphagia, abdominal pain, nausea /vomiting, rectal bleeding, melena, change in bowels Genitourinary: No dysuria, hematuria, pyuria, incontinence, nocturia Musculoskeletal: No joint stiffness, joint swelling, weakness, pain Dermatologic: No rash, pruritus, change in appearance of skin Neurologic: No dizziness, headache, syncope, seizures Psychiatric: No significant anxiety, depression, insomnia, anorexia Endocrine: No change in hair/skin/nails, excessive thirst, excessive hunger, excessive urination  Hematologic/lymphatic: No significant bruising, lymphadenopathy, abnormal bleeding Allergy/immunology: No itchy/watery eyes, significant sneezing, urticaria, angioedema  Physical exam:  Pertinent or positive findings: She appears her stated age.  She has slight dysarthria.  The corner of the mouth sags on the right and there is slight decrease in the right nasolabial fold definition.  There is slight ptosis of  the left eye.  Grade 1/2 systolic murmur  is present at the right base.  She has a faint right carotid bruit.  Pedal pulses are decreased.  She is weak to opposition generally.  There is no dramatic asymmetry to her weakness.  She has a hummingbird tattoo over the right ankle.  General appearance: Adequately nourished; no acute distress, increased work of breathing is present.   Lymphatic: No lymphadenopathy about the head, neck, axilla. Eyes: No conjunctival inflammation or lid edema is present. There is no scleral icterus. Ears:  External ear exam shows no significant lesions or deformities.   Nose:  External nasal examination shows no deformity or inflammation. Nasal mucosa are pink and moist without lesions, exudates Oral exam: Lips and gums are healthy appearing.There is no oropharyngeal erythema or exudate. Neck:  No thyromegaly, masses, tenderness noted.    Heart:  Normal rate and regular rhythm. S1 and S2 normal without gallop, click, rub.  Lungs: Chest clear to auscultation without wheezes, rhonchi, rales, rubs. Abdomen: Bowel sounds are normal.  Abdomen is soft and nontender with no organomegaly, hernias, masses. GU: Deferred  Extremities:  No cyanosis, clubbing, edema. Neurologic exam: Balance, Rhomberg, finger to nose testing could not be completed due to clinical state Skin: Warm & dry w/o tenting. No significant lesions or rash.  See clinical summary under each active problem in the Problem List with associated updated therapeutic plan

## 2020-02-20 NOTE — Assessment & Plan Note (Addendum)
Focus will be on controlling risk factors. Add low-dose beta-blocker and low-dose rosuvastatin

## 2020-02-20 NOTE — Assessment & Plan Note (Addendum)
Because of history of MI and now stroke; LDL goal is less than 70.  Risks & goals discussed. Low-dose rosuvastatin ordered. Mediterranean diet discussed

## 2020-02-20 NOTE — Assessment & Plan Note (Signed)
And low-dose beta-blocker.

## 2020-02-20 NOTE — Progress Notes (Signed)
Pt assisted with ADL's;  picked up from personnel with [facility] without incident.

## 2020-02-24 ENCOUNTER — Non-Acute Institutional Stay (SKILLED_NURSING_FACILITY): Payer: Self-pay | Admitting: Adult Health

## 2020-02-24 ENCOUNTER — Encounter: Payer: Self-pay | Admitting: Adult Health

## 2020-02-24 DIAGNOSIS — I10 Essential (primary) hypertension: Secondary | ICD-10-CM

## 2020-02-24 DIAGNOSIS — E785 Hyperlipidemia, unspecified: Secondary | ICD-10-CM

## 2020-02-24 DIAGNOSIS — F482 Pseudobulbar affect: Secondary | ICD-10-CM

## 2020-02-24 DIAGNOSIS — Z8673 Personal history of transient ischemic attack (TIA), and cerebral infarction without residual deficits: Secondary | ICD-10-CM

## 2020-02-24 DIAGNOSIS — E1151 Type 2 diabetes mellitus with diabetic peripheral angiopathy without gangrene: Secondary | ICD-10-CM

## 2020-02-24 LAB — HEMOGLOBIN A1C: Hemoglobin A1C: 7

## 2020-02-24 MED ORDER — NUEDEXTA 20-10 MG PO CAPS: 1.0000 | ORAL_CAPSULE | Freq: Every day | ORAL | 0 refills | Status: DC

## 2020-02-24 NOTE — Progress Notes (Signed)
Location:  [facility] Room Number: 315-A Place of Service:  SNF (31) Provider:  Kenard GowerMedina-Vargas, Lilana Blasko, DNP, FNP-BC  Patient Care Team: Patient, No Pcp Per as PCP - General (General Practice)  Extended Emergency Contact Information Primary Emergency Contact: Hanley Seamenmanley, brad Mobile Phone: [phone] Relation: Son  Code Status:  Full Code  Goals of care: Advanced Directive information Advanced Directives 02/16/2020  Does Patient Have a Medical Advance Directive? No  Would patient like information on creating a medical advance directive? No - Patient declined     Chief Complaint  Patient presents with  . Acute Visit    Patient is seen to follow up on blood pressure.     HPI:  Pt is a 74 y.o. female seen today for follow-up of her blood pressure.  She is a [facility].  She has a PMH of MI, dyslipidemia and essential hypertension.  She [facility].  She had a fall at home while trying to turn around and was found by son down after 5 days.  She was too weak to call or get up after falling.  Patient reported that she has been weak for a few months and uses a walker at home.  EMS was called and brought her to ED.  She was found to have a stroke involving posterior team of left internal capsule and left parietal lobe.  Neurology was consulted and she was started on DAPT for 3 weeks then Plavix alone.  [facility], she was recently started metoprolol tartrate 25 mg twice a day in addition to her amlodipine besylate 10 mg daily for uncontrolled hypertension on 02/20/2020.  She was seen in her room today.  SBP ranging from 133 to 176.  She denies having headache.  He was noted to be conversing normally then would start to cry out loud for several times. She denies having any pain.    Past Medical History:  Diagnosis Date  . Hypertension   .  MI (myocardial infarction) (HCC)   . Stroke [facility](HCC)    Past Surgical History:  Procedure Laterality Date  . ABDOMINAL HYSTERECTOMY    . APPENDECTOMY    . BACK SURGERY      Allergies  Allergen Reactions  . Allopurinol Shortness Of Breath  . Tetracycline Anaphylaxis  . Amoxicillin-Pot Clavulanate Diarrhea  . Atorvastatin Other (See Comments)    Unknown reaction - [facility] by [facility] 05/10/2012  . Azithromycin Other (See Comments)    Caused blisters inside and out  . Codeine Other (See Comments)    Altered mental status  . Lisinopril Other (See Comments)    Unknown reaction - [facility] [place] [facility] 12/23/2008 02/20/2020 she believes "reaction" was NP cough. No PMH of angioedema  . Sulfa Antibiotics Diarrhea    Outpatient Encounter Medications as of 02/24/2020  Medication Sig  . amLODipine (NORVASC) 10 MG tablet Take 1 tablet (10 mg total) by mouth daily.  Marland Kitchen. aspirin EC 81 MG EC tablet Take 1 tablet (81 mg total) by mouth daily for 16 days. Swallow whole.  . clopidogrel (PLAVIX) 75 MG tablet Take 1 tablet (75 mg total) by mouth daily.  . hydrochlorothiazide (HYDRODIURIL) 25 MG tablet Take 1 tablet (25 mg total) by mouth daily.  Marland Kitchen. loratadine (CLARITIN) 10 MG tablet Take 10 mg by mouth daily as needed for allergies.  . metFORMIN (GLUCOPHAGE) 1000 MG tablet Take 1  tablet (1,000 mg total) by mouth daily with breakfast.  . metoprolol tartrate (LOPRESSOR) 25 MG tablet Take 25 mg by mouth 2 (two) times daily.  . rosuvastatin (CRESTOR) 20 MG tablet Take 1 tablet (20 mg total) by mouth daily.  Marland Kitchen senna-docusate (SENOKOT-S) 8.6-50 MG tablet Take 1 tablet by mouth at bedtime as needed for mild constipation.  . Tetrahydrozoline HCl (VISINE OP) Place 1 drop into both eyes daily as needed (red eyes).   No facility-administered encounter medications on file as of 02/24/2020.    Review of Systems  GENERAL: No fever or chills  MOUTH and THROAT: Denies oral discomfort, gingival  pain or bleeding RESPIRATORY: no cough, SOB, DOE, wheezing, hemoptysis CARDIAC: No chest pain, edema or palpitations GI: No abdominal pain, diarrhea, constipation, heart burn, nausea or vomiting GU: Denies dysuria, frequency, hematuria or discharge NEUROLOGICAL: Denies dizziness, syncope, numbness, or headache PSYCHIATRIC: cries out several times during conversations   Immunization History  Administered Date(s) Administered  . Influenza, High Dose Seasonal PF 05/04/2016  . Pneumococcal Polysaccharide-23 08/29/2003, 06/30/2009  . Zoster 01/29/2006   Pertinent  Health Maintenance Due  Topic Date Due  . FOOT EXAM  Never done  . OPHTHALMOLOGY EXAM  Never done  . URINE MICROALBUMIN  Never done  . MAMMOGRAM  Never done  . COLONOSCOPY  Never done  . DEXA SCAN  Never done  . PNA vac Low Risk Adult (1 of 2 - PCV13) 05/04/2011  . INFLUENZA VACCINE  03/28/2020  . HEMOGLOBIN A1C  08/16/2020    Vitals:   02/24/20 1503  BP: 133/73  Pulse: 65  Resp: 20  Temp: 97.7 F (36.5 C)  TempSrc: Oral  SpO2: 97%  Weight: 136 lb 12.8 oz (62.1 kg)  Height: 5\' 1"  (1.549 m)   Body mass index is 25.85 kg/m.  Physical Exam  GENERAL APPEARANCE: Well nourished. In no acute distress. Normal body habitus SKIN:  Skin is warm and dry.  MOUTH and THROAT: Lips are without lesions. Oral mucosa is moist and without lesions. Tongue is normal in shape, size, and color and without lesions RESPIRATORY: Breathing is even & unlabored, BS CTAB CARDIAC: RRR, no murmur,no extra heart sounds, no edema GI: Abdomen soft, normal BS, no masses, no tenderness EXTREMITIES:  Able to move X 4 extremities, BLE weakness NEUROLOGICAL: There is no tremor. Speech is clear. Alert and oriented X 3. PSYCHIATRIC:  Affect is sad  Labs reviewed: Recent Labs    02/16/20 1400 02/17/20 0741 02/18/20 0500  NA 140 138 140  K 3.0* 3.4* 3.5  CL 107 106 106  CO2 23 23 24   GLUCOSE 177* 195* 145*  BUN 25* 21 18  CREATININE 0.99  0.90 0.75  CALCIUM 9.3 9.2 9.3   Recent Labs    01/02/20 1312 02/15/20 1650 02/17/20 0741  AST 12* 16 14*  ALT 15 18 19   ALKPHOS 50 51 46  BILITOT 0.8 1.3* 0.9  PROT 7.6 6.9 5.9*  ALBUMIN 4.5 3.8 3.3*   Recent Labs    01/02/20 1312 01/02/20 1312 02/15/20 1650 02/15/20 1650 02/16/20 1400 02/17/20 0741 02/18/20 0500  WBC 8.5   < > 9.1   < > 9.2 7.0 6.8  NEUTROABS 5.7  --  7.0  --   --   --   --   HGB 13.0   < > 13.3   < > 12.5 11.8* 11.1*  HCT 40.2   < > 40.9   < > 38.3 35.2* 33.9*  MCV 87.8   < >  87.2   < > 87.6 86.5 87.1  PLT 317   < > 269   < > 268 236 233   < > = values in this interval not displayed.   Lab Results  Component Value Date   TSH 2.065 02/15/2020   Lab Results  Component Value Date   HGBA1C 6.9 (H) 02/15/2020   Lab Results  Component Value Date   CHOL 223 (H) 02/16/2020   HDL 49 02/16/2020   LDLCALC 143 (H) 02/16/2020   TRIG 157 (H) 02/16/2020   CHOLHDL 4.6 02/16/2020    Significant Diagnostic Results in last 30 days:  CT ANGIO HEAD W OR WO CONTRAST  Result Date: 02/16/2020 CLINICAL DATA:  Stroke follow-up. EXAM: CT ANGIOGRAPHY HEAD AND NECK TECHNIQUE: Multidetector CT imaging of the head and neck was performed using the standard protocol during bolus administration of intravenous contrast. Multiplanar CT image reconstructions and MIPs were obtained to evaluate the vascular anatomy. Carotid stenosis measurements (when applicable) are obtained utilizing NASCET criteria, using the distal internal carotid diameter as the denominator. CONTRAST:  75mL OMNIPAQUE IOHEXOL 350 MG/ML SOLN COMPARISON:  None. FINDINGS: CTA NECK FINDINGS Aortic arch: Standard 3 vessel aortic arch with mild atherosclerotic plaque. No significant arch vessel origin stenosis. Right carotid system: Patent with mild-to-moderate calcified plaque in the carotid bulb. No evidence of significant stenosis or dissection. Left carotid system: Patent with mild, predominantly calcified plaque  at the carotid bifurcation. No evidence of significant stenosis or dissection. Vertebral arteries: Patent without evidence of significant stenosis or dissection. Both V2 segments are tortuous. Skeleton: Advanced left facet arthrosis at C3-4 and C4-5 with minimal anterolisthesis. Other neck: Multiple small right submandibular calculi measuring up to 3 mm in size. Dilatation of the intraglandular and proximal extraglandular submandibular ducts bilaterally without evidence of acute sialadenitis. No evidence of cervical lymphadenopathy or mass. Upper chest: Calcified right hilar lymph nodes.  Clear lung apices. Review of the MIP images confirms the above findings CTA HEAD FINDINGS Anterior circulation: The internal carotid arteries are patent from skull base to carotid termini with atherosclerotic plaque resulting in mild right and moderate left paraclinoid stenoses. There is a 2 mm posteriorly projecting aneurysm from the proximal left supraclinoid ICA. ACAs and MCAs are patent with moderate diffuse atherosclerotic type irregularity but no proximal branch occlusion. There is a mild to moderate distal right M1 stenosis, and there is minimal to mild left M1 narrowing. Posterior circulation: The intracranial vertebral arteries are patent to the basilar with the right being dominant. There is relatively diffuse atherosclerotic irregularity of both V4 segments with up to mild stenosis. The basilar artery is patent with diffuse irregularity and a mild stenosis proximally. Posterior communicating arteries are diminutive or absent. The PCAs are patent with extensive atherosclerotic type irregularity diffusely. There are moderate right P1, moderate left P1-P2 junction, and severe right P2 stenoses. No aneurysm is identified. Venous sinuses: As permitted by contrast timing, patent. Anatomic variants: None. Review of the MIP images confirms the above findings IMPRESSION: 1. Widespread intracranial atherosclerosis without major  branch occlusion. 2. Mild right and moderate left paraclinoid ICA stenoses. 3. Mild to moderate right M1 and severe right P2 stenoses. 4. 2 mm left supraclinoid ICA aneurysm. 5. Widely patent cervical carotid and vertebral arteries. 6. Aortic Atherosclerosis (ICD10-I70.0). Electronically Signed   By: Sebastian Ache M.D.   On: 02/16/2020 08:44   CT Head Wo Contrast  Result Date: 02/15/2020 CLINICAL DATA:  Subacute neuro deficit. Dizziness. Fall a few days ago.  EXAM: CT HEAD WITHOUT CONTRAST TECHNIQUE: Contiguous axial images were obtained from the base of the skull through the vertex without intravenous contrast. COMPARISON:  Jan 02, 2020 FINDINGS: Brain: No subdural, epidural, or subarachnoid hemorrhage. Ventricles and sulci are prominent but stable. Moderate white matter changes are stable. Scattered lacunar infarcts are stable. No acute cortical ischemia or infarct. No mass effect or midline shift. A focus of high attenuation in the right cerebellum on coronal image 47 correlates with a region of susceptibility artifact on an MRI from Jan 02, 2020 and likely represents either calcification or nonacute blood. This does not appear to be an acute finding based on the comparison MRI. Vascular: Calcified atherosclerosis in the intracranial carotids. Skull: Normal. Negative for fracture or focal lesion. Sinuses/Orbits: No acute finding. Other: None. IMPRESSION: No acute intracranial abnormalities. Chronic white matter changes and lacunar infarcts. Electronically Signed   By: Gerome Sam III M.D   On: 02/15/2020 17:50   CT ANGIO NECK W OR WO CONTRAST  Result Date: 02/16/2020 CLINICAL DATA:  Stroke follow-up. EXAM: CT ANGIOGRAPHY HEAD AND NECK TECHNIQUE: Multidetector CT imaging of the head and neck was performed using the standard protocol during bolus administration of intravenous contrast. Multiplanar CT image reconstructions and MIPs were obtained to evaluate the vascular anatomy. Carotid stenosis measurements  (when applicable) are obtained utilizing NASCET criteria, using the distal internal carotid diameter as the denominator. CONTRAST:  75mL OMNIPAQUE IOHEXOL 350 MG/ML SOLN COMPARISON:  None. FINDINGS: CTA NECK FINDINGS Aortic arch: Standard 3 vessel aortic arch with mild atherosclerotic plaque. No significant arch vessel origin stenosis. Right carotid system: Patent with mild-to-moderate calcified plaque in the carotid bulb. No evidence of significant stenosis or dissection. Left carotid system: Patent with mild, predominantly calcified plaque at the carotid bifurcation. No evidence of significant stenosis or dissection. Vertebral arteries: Patent without evidence of significant stenosis or dissection. Both V2 segments are tortuous. Skeleton: Advanced left facet arthrosis at C3-4 and C4-5 with minimal anterolisthesis. Other neck: Multiple small right submandibular calculi measuring up to 3 mm in size. Dilatation of the intraglandular and proximal extraglandular submandibular ducts bilaterally without evidence of acute sialadenitis. No evidence of cervical lymphadenopathy or mass. Upper chest: Calcified right hilar lymph nodes.  Clear lung apices. Review of the MIP images confirms the above findings CTA HEAD FINDINGS Anterior circulation: The internal carotid arteries are patent from skull base to carotid termini with atherosclerotic plaque resulting in mild right and moderate left paraclinoid stenoses. There is a 2 mm posteriorly projecting aneurysm from the proximal left supraclinoid ICA. ACAs and MCAs are patent with moderate diffuse atherosclerotic type irregularity but no proximal branch occlusion. There is a mild to moderate distal right M1 stenosis, and there is minimal to mild left M1 narrowing. Posterior circulation: The intracranial vertebral arteries are patent to the basilar with the right being dominant. There is relatively diffuse atherosclerotic irregularity of both V4 segments with up to mild stenosis.  The basilar artery is patent with diffuse irregularity and a mild stenosis proximally. Posterior communicating arteries are diminutive or absent. The PCAs are patent with extensive atherosclerotic type irregularity diffusely. There are moderate right P1, moderate left P1-P2 junction, and severe right P2 stenoses. No aneurysm is identified. Venous sinuses: As permitted by contrast timing, patent. Anatomic variants: None. Review of the MIP images confirms the above findings IMPRESSION: 1. Widespread intracranial atherosclerosis without major branch occlusion. 2. Mild right and moderate left paraclinoid ICA stenoses. 3. Mild to moderate right M1 and severe right  P2 stenoses. 4. 2 mm left supraclinoid ICA aneurysm. 5. Widely patent cervical carotid and vertebral arteries. 6. Aortic Atherosclerosis (ICD10-I70.0). Electronically Signed   By: Sebastian Ache M.D.   On: 02/16/2020 08:44   MR BRAIN W WO CONTRAST  Result Date: 02/15/2020 CLINICAL DATA:  Initial evaluation for acute encephalopathy. EXAM: MRI HEAD WITHOUT AND WITH CONTRAST TECHNIQUE: Multiplanar, multiecho pulse sequences of the brain and surrounding structures were obtained without and with intravenous contrast. CONTRAST:  47mL GADAVIST GADOBUTROL 1 MMOL/ML IV SOLN COMPARISON:  Comparison made with prior head CT from earlier the same day as well as recent brain MRI from 01/02/2020. FINDINGS: Brain: Diffuse prominence of the CSF containing spaces compatible with generalized age-related cerebral atrophy. Moderate to advanced chronic microvascular ischemic disease again noted involving the periventricular and deep white matter both cerebral hemispheres, with patchy involvement of the pons and brainstem. Multiple scattered superimposed remote lacunar infarcts present about the bilateral basal ganglia and thalami. 8 mm focus of restricted diffusion seen centered at the posterior limb of the left internal capsule (series 5, image 78) consistent with an acute  ischemic infarct. There is an additional 5 mm focus of vague diffusion abnormality involving the subcortical left parietal lobe without ADC correlate, likely an evolving subacute ischemic infarct (series 5, image 87). No associated hemorrhage or mass effect about these ischemic changes. No evidence for acute intracranial hemorrhage. Few scattered chronic micro hemorrhages noted involving the right cerebellum, right thalamus, and left periatrial white matter, most likely related to underlying hypertension/small vessel disease. Single 7 mm focus of curvilinear enhancement seen involving the left parietal gray matter (series 18, image 37), new from recent brain MRI, but favored to be related to an evolving subacute ischemic infarct given the close proximity to the above-mentioned mild diffusion abnormality. No other foci of abnormal enhancement. Previously described stippled enhancement seen involving the brainstem and periaqueductal gray matter is now perhaps only faintly visualized, decreased in prominence from prior exam. Finding felt to be have been related to a subacute ischemic lacunar type infarct, now more chronic and evident on today's exam (series 15, image 12 on T2 weighted sequence). No other abnormal enhancement. No mass lesion, midline shift, or mass effect. Diffuse ventricular prominence related to global parenchymal volume loss without hydrocephalus. No extra-axial fluid collection. Partially empty sella noted. Midline structures intact. Vascular: Major intracranial vascular flow voids are maintained. Skull and upper cervical spine: Craniocervical junction within normal limits. Bone marrow signal intensity normal. No scalp soft tissue abnormality. Sinuses/Orbits: Globes and orbital soft tissues within normal limits. Mild scattered mucosal thickening noted within the ethmoidal air cells and maxillary sinuses. Trace right mastoid effusion noted, of doubtful significance. Inner ear structures grossly  normal. Other: None. IMPRESSION: 1. 8 mm acute ischemic nonhemorrhagic infarct involving the posterior limb of the left internal capsule. 2. Additional 5 mm focus of vague diffusion abnormality involving the subcortical left parietal lobe, likely an evolving subacute ischemic infarct. 3. 7 mm focus of curvilinear enhancement involving the left parietal cortex, new as compared to recent brain MRI, and favored to be secondary to evolving subacute ischemia given the close proximity to the above mention diffusion abnormality. Short interval follow-up MRI in 3 months is suggested to ensure these changes resolve. 4. Near complete interval resolution of previously seen stippled enhancement involving the periaqueductal gray matter/brainstem. Finding felt to have been related to an evolving subacute ischemic infarct on prior exam, now more chronic and evident on today's exam. 5. Underlying age-related cerebral  atrophy with fairly advanced chronic microvascular ischemic disease. Electronically Signed   By: Rise Mu M.D.   On: 02/15/2020 21:39   ECHOCARDIOGRAM COMPLETE  Result Date: 02/16/2020    ECHOCARDIOGRAM REPORT   Patient Name:   JOSEPHA BARBIER Date of Exam: 02/16/2020 Medical Rec #:  [id number]    Height:       61.0 in Accession #:    [id number]   Weight:       144.0 lb Date of Birth:  1946/06/08     BSA:          1.643 m Patient Age:    73 years     BP:           190/91 mmHg Patient Gender: F            HR:           82 bpm. Exam Location:  Inpatient Procedure: 2D Echo Indications:    stroke 434.91  History:        Patient has no prior history of Echocardiogram examinations.                 Previous Myocardial Infarction, Stroke; Risk                 Factors:Hypertension.  Sonographer:    Celene Skeen RDCS (AE) Referring Phys: [id number] NISCHAL NARENDRA IMPRESSIONS  1. Vigorous LV systolic function; grade 1 diastolic dysfunction; mild LVH.  2. Left ventricular ejection fraction, by estimation, is 70 to 75%.  The left ventricle has hyperdynamic function. The left ventricle has no regional wall motion abnormalities. There is mild left ventricular hypertrophy. Left ventricular diastolic parameters are consistent with Grade I diastolic dysfunction (impaired relaxation).  3. Right ventricular systolic function is normal. The right ventricular size is normal.  4. The mitral valve is normal in structure. No evidence of mitral valve regurgitation. No evidence of mitral stenosis.  5. The aortic valve is tricuspid. Aortic valve regurgitation is not visualized. Mild aortic valve sclerosis is present, with no evidence of aortic valve stenosis.  6. The inferior vena cava is normal in size with greater than 50% respiratory variability, suggesting right atrial pressure of 3 mmHg. FINDINGS  Left Ventricle: Left ventricular ejection fraction, by estimation, is 70 to 75%. The left ventricle has hyperdynamic function. The left ventricle has no regional wall motion abnormalities. The left ventricular internal cavity size was normal in size. There is mild left ventricular hypertrophy. Left ventricular diastolic parameters are consistent with Grade I diastolic dysfunction (impaired relaxation). Right Ventricle: The right ventricular size is normal.Right ventricular systolic function is normal. Left Atrium: Left atrial size was normal in size. Right Atrium: Right atrial size was normal in size. Pericardium: There is no evidence of pericardial effusion. Mitral Valve: The mitral valve is normal in structure. Normal mobility of the mitral valve leaflets. Mild mitral annular calcification. No evidence of mitral valve regurgitation. No evidence of mitral valve stenosis. Tricuspid Valve: The tricuspid valve is normal in structure. Tricuspid valve regurgitation is trivial. No evidence of tricuspid stenosis. Aortic Valve: The aortic valve is tricuspid. Aortic valve regurgitation is not visualized. Mild aortic valve sclerosis is present, with no  evidence of aortic valve stenosis. Pulmonic Valve: The pulmonic valve was not well visualized. Pulmonic valve regurgitation is not visualized. No evidence of pulmonic stenosis. Aorta: The aortic root is normal in size and structure. Venous: The inferior vena cava is normal in size with greater than 50% respiratory variability, suggesting right  atrial pressure of 3 mmHg. IAS/Shunts: No atrial level shunt detected by color flow Doppler. Additional Comments: Vigorous LV systolic function; grade 1 diastolic dysfunction; mild LVH.  LEFT VENTRICLE PLAX 2D LVIDd:         3.70 cm  Diastology LVIDs:         2.40 cm  LV e' lateral:   7.51 cm/s LV PW:         1.10 cm  LV E/e' lateral: 9.7 LV IVS:        1.20 cm  LV e' medial:    7.18 cm/s LVOT diam:     1.80 cm  LV E/e' medial:  10.1 LV SV:         48 LV SV Index:   29 LVOT Area:     2.54 cm  RIGHT VENTRICLE RV S prime:     12.50 cm/s TAPSE (M-mode): 1.6 cm LEFT ATRIUM           Index LA diam:      3.70 cm 2.25 cm/m LA Vol (A2C): 35.7 ml 21.73 ml/m  AORTIC VALVE LVOT Vmax:   92.70 cm/s LVOT Vmean:  65.200 cm/s LVOT VTI:    0.188 m  AORTA Ao Root diam: 3.00 cm MITRAL VALVE MV Area (PHT): 3.12 cm     SHUNTS MV Decel Time: 243 msec     Systemic VTI:  0.19 m MV E velocity: 72.80 cm/s   Systemic Diam: 1.80 cm MV A velocity: 101.00 cm/s MV E/A ratio:  0.72 Olga Millers MD Electronically signed by Olga Millers MD Signature Date/Time: 02/16/2020/12:06:57 PM    Final     Assessment/Plan  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  Dispense: 30 capsule; Refill: 0  2. Recent cerebrovascular accident (CVA) -Continue PT and OT, for therapeutic and strengthening exercises continue 66 -Continue Plavix and aspirin with end date 03/06/20 then Plavix alone X 3 months -Follow-up with neurologist, Dr. Pearlean Brownie, in 6 weeks  3. Poorly-controlled hypertension -  BPs stable, will continue to monitor - continue metoprolol tartrate, hydrochlorothiazide and amlodipine - metoprolol tartrate (LOPRESSOR) 25 MG tablet; Take 25 mg by mouth 2 (two) times daily.  4. DM (diabetes mellitus), type 2 with peripheral vascular complications (HCC) - Lab Results  Component Value Date   HGBA1C 6.9 (H) 02/15/2020   -CBGs stable, continue Metformin   5. Dyslipidemia Lab Results  Component Value Date   CHOL 223 (H) 02/16/2020   HDL 49 02/16/2020   LDLCALC 143 (H) 02/16/2020   TRIG 157 (H) 02/16/2020   CHOLHDL 4.6 02/16/2020   -Continue rosuvastatin 20 mg 1 tab daily      Family/ staff Communication: Discussed plan of care with resident and charge nurse.  Labs/tests ordered: None  Goals of care:   Short-term care    Kenard Gower, DNP, FNP-BC [facility] [phone] (Monday-Friday 8:00 a.m. - 5:00 p.m.) [phone] (after hours)

## 2020-02-26 ENCOUNTER — Encounter: Payer: Self-pay | Admitting: Student

## 2020-03-08 ENCOUNTER — Inpatient Hospital Stay (HOSPITAL_COMMUNITY): Payer: Medicare Other

## 2020-03-08 ENCOUNTER — Emergency Department (HOSPITAL_COMMUNITY): Payer: Medicare Other

## 2020-03-08 ENCOUNTER — Other Ambulatory Visit: Payer: Self-pay

## 2020-03-08 ENCOUNTER — Encounter (HOSPITAL_COMMUNITY): Payer: Self-pay | Admitting: Emergency Medicine

## 2020-03-08 ENCOUNTER — Inpatient Hospital Stay (HOSPITAL_COMMUNITY)
Admission: EM | Admit: 2020-03-08 | Discharge: 2020-04-27 | DRG: 682 | Disposition: A | Discharge status: Skilled Nursing Facility | Payer: Medicare Other | Source: Skilled Nursing Facility | Attending: Internal Medicine | Admitting: Internal Medicine

## 2020-03-08 DIAGNOSIS — K828 Other specified diseases of gallbladder: Secondary | ICD-10-CM | POA: Diagnosis present

## 2020-03-08 DIAGNOSIS — G835 Locked-in state: Secondary | ICD-10-CM | POA: Diagnosis not present

## 2020-03-08 DIAGNOSIS — E1151 Type 2 diabetes mellitus with diabetic peripheral angiopathy without gangrene: Secondary | ICD-10-CM | POA: Diagnosis present

## 2020-03-08 DIAGNOSIS — E875 Hyperkalemia: Secondary | ICD-10-CM | POA: Diagnosis present

## 2020-03-08 DIAGNOSIS — Z9181 History of falling: Secondary | ICD-10-CM | POA: Insufficient documentation

## 2020-03-08 DIAGNOSIS — J189 Pneumonia, unspecified organism: Secondary | ICD-10-CM

## 2020-03-08 DIAGNOSIS — G92 Toxic encephalopathy: Secondary | ICD-10-CM | POA: Diagnosis not present

## 2020-03-08 DIAGNOSIS — L89616 Pressure-induced deep tissue damage of right heel: Secondary | ICD-10-CM | POA: Diagnosis present

## 2020-03-08 DIAGNOSIS — J168 Pneumonia due to other specified infectious organisms: Secondary | ICD-10-CM | POA: Diagnosis not present

## 2020-03-08 DIAGNOSIS — I471 Supraventricular tachycardia: Secondary | ICD-10-CM | POA: Diagnosis present

## 2020-03-08 DIAGNOSIS — J690 Pneumonitis due to inhalation of food and vomit: Secondary | ICD-10-CM | POA: Diagnosis not present

## 2020-03-08 DIAGNOSIS — Z4659 Encounter for fitting and adjustment of other gastrointestinal appliance and device: Secondary | ICD-10-CM

## 2020-03-08 DIAGNOSIS — Z888 Allergy status to other drugs, medicaments and biological substances status: Secondary | ICD-10-CM

## 2020-03-08 DIAGNOSIS — Z8673 Personal history of transient ischemic attack (TIA), and cerebral infarction without residual deficits: Secondary | ICD-10-CM

## 2020-03-08 DIAGNOSIS — N179 Acute kidney failure, unspecified: Secondary | ICD-10-CM

## 2020-03-08 DIAGNOSIS — I252 Old myocardial infarction: Secondary | ICD-10-CM

## 2020-03-08 DIAGNOSIS — Z20822 Contact with and (suspected) exposure to covid-19: Secondary | ICD-10-CM | POA: Diagnosis present

## 2020-03-08 DIAGNOSIS — Z931 Gastrostomy status: Secondary | ICD-10-CM

## 2020-03-08 DIAGNOSIS — R471 Dysarthria and anarthria: Secondary | ICD-10-CM | POA: Diagnosis present

## 2020-03-08 DIAGNOSIS — G8191 Hemiplegia, unspecified affecting right dominant side: Secondary | ICD-10-CM | POA: Diagnosis present

## 2020-03-08 DIAGNOSIS — L89156 Pressure-induced deep tissue damage of sacral region: Secondary | ICD-10-CM | POA: Diagnosis present

## 2020-03-08 DIAGNOSIS — E872 Acidosis, unspecified: Secondary | ICD-10-CM

## 2020-03-08 DIAGNOSIS — F05 Delirium due to known physiological condition: Secondary | ICD-10-CM | POA: Diagnosis present

## 2020-03-08 DIAGNOSIS — I6381 Other cerebral infarction due to occlusion or stenosis of small artery: Secondary | ICD-10-CM | POA: Diagnosis not present

## 2020-03-08 DIAGNOSIS — N1831 Chronic kidney disease, stage 3a: Secondary | ICD-10-CM | POA: Diagnosis present

## 2020-03-08 DIAGNOSIS — R140 Abdominal distension (gaseous): Secondary | ICD-10-CM

## 2020-03-08 DIAGNOSIS — R54 Age-related physical debility: Secondary | ICD-10-CM | POA: Diagnosis present

## 2020-03-08 DIAGNOSIS — F329 Major depressive disorder, single episode, unspecified: Secondary | ICD-10-CM | POA: Diagnosis present

## 2020-03-08 DIAGNOSIS — E860 Dehydration: Secondary | ICD-10-CM | POA: Diagnosis present

## 2020-03-08 DIAGNOSIS — Z885 Allergy status to narcotic agent status: Secondary | ICD-10-CM

## 2020-03-08 DIAGNOSIS — Z7401 Bed confinement status: Secondary | ICD-10-CM

## 2020-03-08 DIAGNOSIS — R748 Abnormal levels of other serum enzymes: Secondary | ICD-10-CM

## 2020-03-08 DIAGNOSIS — Z882 Allergy status to sulfonamides status: Secondary | ICD-10-CM

## 2020-03-08 DIAGNOSIS — G9341 Metabolic encephalopathy: Secondary | ICD-10-CM | POA: Diagnosis not present

## 2020-03-08 DIAGNOSIS — D72829 Elevated white blood cell count, unspecified: Secondary | ICD-10-CM

## 2020-03-08 DIAGNOSIS — E874 Mixed disorder of acid-base balance: Secondary | ICD-10-CM | POA: Diagnosis not present

## 2020-03-08 DIAGNOSIS — R339 Retention of urine, unspecified: Secondary | ICD-10-CM | POA: Diagnosis present

## 2020-03-08 DIAGNOSIS — Z515 Encounter for palliative care: Secondary | ICD-10-CM

## 2020-03-08 DIAGNOSIS — Z7189 Other specified counseling: Secondary | ICD-10-CM

## 2020-03-08 DIAGNOSIS — I10 Essential (primary) hypertension: Secondary | ICD-10-CM | POA: Diagnosis present

## 2020-03-08 DIAGNOSIS — R1310 Dysphagia, unspecified: Secondary | ICD-10-CM

## 2020-03-08 DIAGNOSIS — Z79899 Other long term (current) drug therapy: Secondary | ICD-10-CM

## 2020-03-08 DIAGNOSIS — A419 Sepsis, unspecified organism: Secondary | ICD-10-CM

## 2020-03-08 DIAGNOSIS — R569 Unspecified convulsions: Secondary | ICD-10-CM | POA: Diagnosis present

## 2020-03-08 DIAGNOSIS — I672 Cerebral atherosclerosis: Secondary | ICD-10-CM | POA: Diagnosis present

## 2020-03-08 DIAGNOSIS — E870 Hyperosmolality and hypernatremia: Secondary | ICD-10-CM | POA: Diagnosis not present

## 2020-03-08 DIAGNOSIS — R627 Adult failure to thrive: Secondary | ICD-10-CM | POA: Diagnosis present

## 2020-03-08 DIAGNOSIS — L89626 Pressure-induced deep tissue damage of left heel: Secondary | ICD-10-CM | POA: Diagnosis present

## 2020-03-08 DIAGNOSIS — R710 Precipitous drop in hematocrit: Secondary | ICD-10-CM | POA: Diagnosis present

## 2020-03-08 DIAGNOSIS — R29729 NIHSS score 29: Secondary | ICD-10-CM | POA: Diagnosis present

## 2020-03-08 DIAGNOSIS — E119 Type 2 diabetes mellitus without complications: Secondary | ICD-10-CM | POA: Insufficient documentation

## 2020-03-08 DIAGNOSIS — E1122 Type 2 diabetes mellitus with diabetic chronic kidney disease: Secondary | ICD-10-CM | POA: Diagnosis present

## 2020-03-08 DIAGNOSIS — E871 Hypo-osmolality and hyponatremia: Secondary | ICD-10-CM | POA: Diagnosis not present

## 2020-03-08 DIAGNOSIS — E876 Hypokalemia: Secondary | ICD-10-CM | POA: Diagnosis not present

## 2020-03-08 DIAGNOSIS — K920 Hematemesis: Secondary | ICD-10-CM

## 2020-03-08 DIAGNOSIS — Z7902 Long term (current) use of antithrombotics/antiplatelets: Secondary | ICD-10-CM

## 2020-03-08 DIAGNOSIS — L8990 Pressure ulcer of unspecified site, unspecified stage: Secondary | ICD-10-CM | POA: Insufficient documentation

## 2020-03-08 DIAGNOSIS — N170 Acute kidney failure with tubular necrosis: Secondary | ICD-10-CM | POA: Diagnosis not present

## 2020-03-08 DIAGNOSIS — E1165 Type 2 diabetes mellitus with hyperglycemia: Secondary | ICD-10-CM | POA: Diagnosis present

## 2020-03-08 DIAGNOSIS — K5641 Fecal impaction: Secondary | ICD-10-CM | POA: Diagnosis not present

## 2020-03-08 DIAGNOSIS — E785 Hyperlipidemia, unspecified: Secondary | ICD-10-CM | POA: Diagnosis present

## 2020-03-08 DIAGNOSIS — R680 Hypothermia, not associated with low environmental temperature: Secondary | ICD-10-CM | POA: Diagnosis present

## 2020-03-08 DIAGNOSIS — R509 Fever, unspecified: Secondary | ICD-10-CM

## 2020-03-08 DIAGNOSIS — I129 Hypertensive chronic kidney disease with stage 1 through stage 4 chronic kidney disease, or unspecified chronic kidney disease: Secondary | ICD-10-CM | POA: Diagnosis present

## 2020-03-08 DIAGNOSIS — Z8719 Personal history of other diseases of the digestive system: Secondary | ICD-10-CM

## 2020-03-08 DIAGNOSIS — I472 Ventricular tachycardia: Secondary | ICD-10-CM | POA: Diagnosis not present

## 2020-03-08 DIAGNOSIS — R4702 Dysphasia: Secondary | ICD-10-CM | POA: Diagnosis present

## 2020-03-08 DIAGNOSIS — R809 Proteinuria, unspecified: Secondary | ICD-10-CM | POA: Diagnosis present

## 2020-03-08 DIAGNOSIS — R778 Other specified abnormalities of plasma proteins: Secondary | ICD-10-CM | POA: Diagnosis present

## 2020-03-08 DIAGNOSIS — R112 Nausea with vomiting, unspecified: Secondary | ICD-10-CM

## 2020-03-08 DIAGNOSIS — I6930 Unspecified sequelae of cerebral infarction: Secondary | ICD-10-CM | POA: Diagnosis present

## 2020-03-08 DIAGNOSIS — E861 Hypovolemia: Secondary | ICD-10-CM | POA: Diagnosis present

## 2020-03-08 DIAGNOSIS — J9811 Atelectasis: Secondary | ICD-10-CM | POA: Diagnosis not present

## 2020-03-08 DIAGNOSIS — I2510 Atherosclerotic heart disease of native coronary artery without angina pectoris: Secondary | ICD-10-CM | POA: Diagnosis present

## 2020-03-08 DIAGNOSIS — Z881 Allergy status to other antibiotic agents status: Secondary | ICD-10-CM

## 2020-03-08 DIAGNOSIS — E11649 Type 2 diabetes mellitus with hypoglycemia without coma: Secondary | ICD-10-CM | POA: Diagnosis not present

## 2020-03-08 DIAGNOSIS — R296 Repeated falls: Secondary | ICD-10-CM | POA: Diagnosis present

## 2020-03-08 DIAGNOSIS — Z7982 Long term (current) use of aspirin: Secondary | ICD-10-CM

## 2020-03-08 DIAGNOSIS — K9423 Gastrostomy malfunction: Secondary | ICD-10-CM

## 2020-03-08 HISTORY — DX: History of falling: Z91.81

## 2020-03-08 HISTORY — DX: Essential (primary) hypertension: I10

## 2020-03-08 LAB — URINALYSIS, ROUTINE W REFLEX MICROSCOPIC
Bilirubin Urine: NEGATIVE
Glucose, UA: NEGATIVE mg/dL
Ketones, ur: NEGATIVE mg/dL
Leukocytes,Ua: NEGATIVE
Nitrite: NEGATIVE
Protein, ur: 100 mg/dL — AB
Specific Gravity, Urine: 1.015 (ref 1.005–1.030)
pH: 5 (ref 5.0–8.0)

## 2020-03-08 LAB — CBC WITH DIFFERENTIAL/PLATELET
Abs Immature Granulocytes: 0.21 10*3/uL — ABNORMAL HIGH (ref 0.00–0.07)
Basophils Absolute: 0.1 10*3/uL (ref 0.0–0.1)
Basophils Relative: 0 %
Eosinophils Absolute: 0 10*3/uL (ref 0.0–0.5)
Eosinophils Relative: 0 %
HCT: 43.8 % (ref 36.0–46.0)
Hemoglobin: 13.9 g/dL (ref 12.0–15.0)
Immature Granulocytes: 1 %
Lymphocytes Relative: 10 %
Lymphs Abs: 2.1 10*3/uL (ref 0.7–4.0)
MCH: 29.3 pg (ref 26.0–34.0)
MCHC: 31.7 g/dL (ref 30.0–36.0)
MCV: 92.4 fL (ref 80.0–100.0)
Monocytes Absolute: 1.5 10*3/uL — ABNORMAL HIGH (ref 0.1–1.0)
Monocytes Relative: 7 %
Neutro Abs: 16.7 10*3/uL — ABNORMAL HIGH (ref 1.7–7.7)
Neutrophils Relative %: 82 %
Platelets: 316 10*3/uL (ref 150–400)
RBC: 4.74 MIL/uL (ref 3.87–5.11)
RDW: 14.2 % (ref 11.5–15.5)
WBC: 20.5 10*3/uL — ABNORMAL HIGH (ref 4.0–10.5)
nRBC: 0 % (ref 0.0–0.2)

## 2020-03-08 LAB — SARS CORONAVIRUS 2 BY RT PCR (HOSPITAL ORDER, PERFORMED IN ~~LOC~~ HOSPITAL LAB): SARS Coronavirus 2: NEGATIVE

## 2020-03-08 LAB — GLUCOSE, CAPILLARY
Glucose-Capillary: 120 mg/dL — ABNORMAL HIGH (ref 70–99)
Glucose-Capillary: 206 mg/dL — ABNORMAL HIGH (ref 70–99)
Glucose-Capillary: 80 mg/dL (ref 70–99)

## 2020-03-08 LAB — CBC
HCT: 41.2 % (ref 36.0–46.0)
Hemoglobin: 13.4 g/dL (ref 12.0–15.0)
MCH: 29.1 pg (ref 26.0–34.0)
MCHC: 32.5 g/dL (ref 30.0–36.0)
MCV: 89.6 fL (ref 80.0–100.0)
Platelets: 394 10*3/uL (ref 150–400)
RBC: 4.6 MIL/uL (ref 3.87–5.11)
RDW: 14.4 % (ref 11.5–15.5)
WBC: 22.3 10*3/uL — ABNORMAL HIGH (ref 4.0–10.5)
nRBC: 0 % (ref 0.0–0.2)

## 2020-03-08 LAB — FOLATE: Folate: 6.6 ng/mL (ref >5.9)

## 2020-03-08 LAB — VITAMIN B12: Vitamin B-12: 390 pg/mL (ref 180–914)

## 2020-03-08 LAB — BASIC METABOLIC PANEL
Anion gap: >20 — ABNORMAL HIGH (ref 5–15)
BUN: 109 mg/dL — ABNORMAL HIGH (ref 8–23)
CO2: 7 mmol/L — ABNORMAL LOW (ref 22–32)
Calcium: 8 mg/dL — ABNORMAL LOW (ref 8.9–10.3)
Chloride: 94 mmol/L — ABNORMAL LOW (ref 98–111)
Creatinine, Ser: 9.33 mg/dL — ABNORMAL HIGH (ref 0.44–1.00)
GFR calc Af Amer: 4 mL/min — ABNORMAL LOW (ref >60)
GFR calc non Af Amer: 4 mL/min — ABNORMAL LOW (ref >60)
Glucose, Bld: 246 mg/dL — ABNORMAL HIGH (ref 70–99)
Potassium: 5.7 mmol/L — ABNORMAL HIGH (ref 3.5–5.1)
Sodium: 140 mmol/L (ref 135–145)

## 2020-03-08 LAB — COMPREHENSIVE METABOLIC PANEL
ALT: 20 U/L (ref 0–44)
AST: 27 U/L (ref 15–41)
Albumin: 4.2 g/dL (ref 3.5–5.0)
Alkaline Phosphatase: 60 U/L (ref 38–126)
BUN: 149 mg/dL — ABNORMAL HIGH (ref 8–23)
CO2: <7 mmol/L — ABNORMAL LOW (ref 22–32)
Calcium: 8.9 mg/dL (ref 8.9–10.3)
Chloride: 88 mmol/L — ABNORMAL LOW (ref 98–111)
Creatinine, Ser: 9.51 mg/dL — ABNORMAL HIGH (ref 0.44–1.00)
GFR calc Af Amer: 4 mL/min — ABNORMAL LOW (ref >60)
GFR calc non Af Amer: 4 mL/min — ABNORMAL LOW (ref >60)
Glucose, Bld: 184 mg/dL — ABNORMAL HIGH (ref 70–99)
Potassium: 6.4 mmol/L (ref 3.5–5.1)
Sodium: 139 mmol/L (ref 135–145)
Total Bilirubin: 0.8 mg/dL (ref 0.3–1.2)
Total Protein: 7.7 g/dL (ref 6.5–8.1)

## 2020-03-08 LAB — TYPE AND SCREEN
ABO/RH(D): O NEG
Antibody Screen: NEGATIVE

## 2020-03-08 LAB — CK: Total CK: 29 U/L — ABNORMAL LOW (ref 38–234)

## 2020-03-08 LAB — LACTIC ACID, PLASMA
Lactic Acid, Venous: >11 mmol/L (ref 0.5–1.9)
Lactic Acid, Venous: >11 mmol/L (ref 0.5–1.9)
Lactic Acid, Venous: >11 mmol/L (ref 0.5–1.9)
Lactic Acid, Venous: 10.3 mmol/L (ref 0.5–1.9)

## 2020-03-08 LAB — BLOOD GAS, VENOUS
Acid-base deficit: 25.2 mmol/L — ABNORMAL HIGH (ref 0.0–2.0)
Bicarbonate: 4.3 mmol/L — ABNORMAL LOW (ref 20.0–28.0)
O2 Saturation: 83.7 %
Patient temperature: 98.6
pCO2, Ven: 15.1 mmHg — CL (ref 44.0–60.0)
pH, Ven: 7.085 — CL (ref 7.250–7.430)
pO2, Ven: 65.5 mmHg — ABNORMAL HIGH (ref 32.0–45.0)

## 2020-03-08 LAB — MRSA PCR SCREENING: MRSA by PCR: NEGATIVE

## 2020-03-08 LAB — LIPASE, BLOOD: Lipase: 199 U/L — ABNORMAL HIGH (ref 11–51)

## 2020-03-08 LAB — CBG MONITORING, ED: Glucose-Capillary: 174 mg/dL — ABNORMAL HIGH (ref 70–99)

## 2020-03-08 LAB — SODIUM, URINE, RANDOM: Sodium, Ur: 82 mmol/L

## 2020-03-08 LAB — CREATININE, URINE, RANDOM: Creatinine, Urine: 58.27 mg/dL

## 2020-03-08 LAB — ABO/RH: ABO/RH(D): O NEG

## 2020-03-08 IMAGING — CT CT HEAD W/O CM
3 series · 16 of 47 positions shown, 19 images · non-contrast
Comparison: [DATE] CT and MRI

CLINICAL DATA: Encephalopathy, nausea, vomiting

EXAM:
CT HEAD WITHOUT CONTRAST
TECHNIQUE: Contiguous axial images were obtained from the base of the skull
through the vertex without intravenous contrast.

[Series 3: head wo · axial · 0.44mm/px · z∈[-264,-134]mm · 10 of 32 slices shown, 13 images]
[im 3/32  brain]
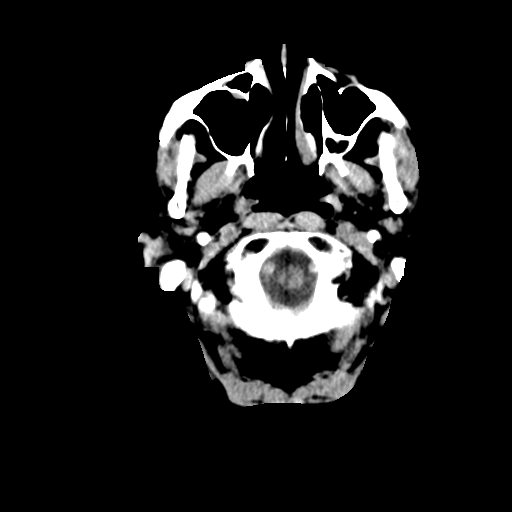
[im 3/32  bone]
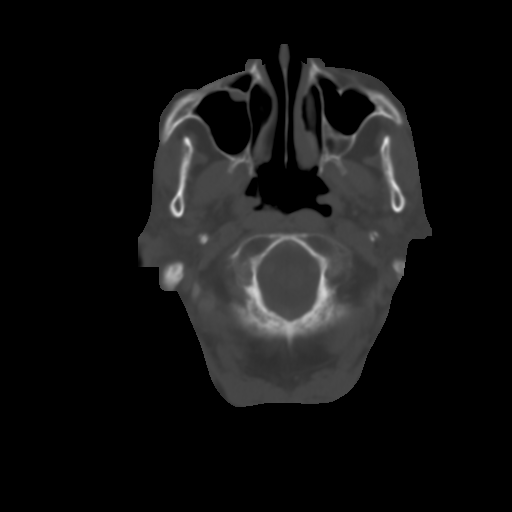
[im 6/32  brain]
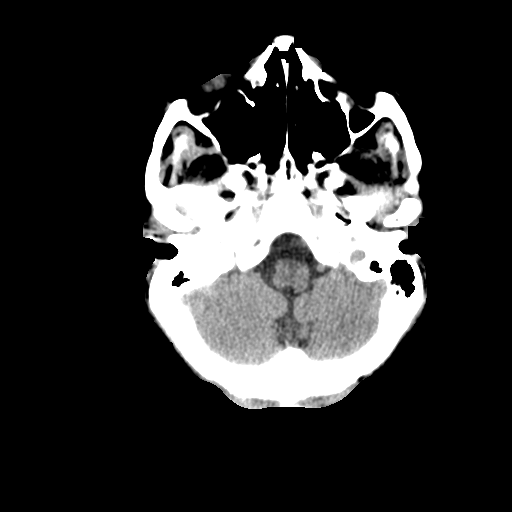
[im 9/32  brain]
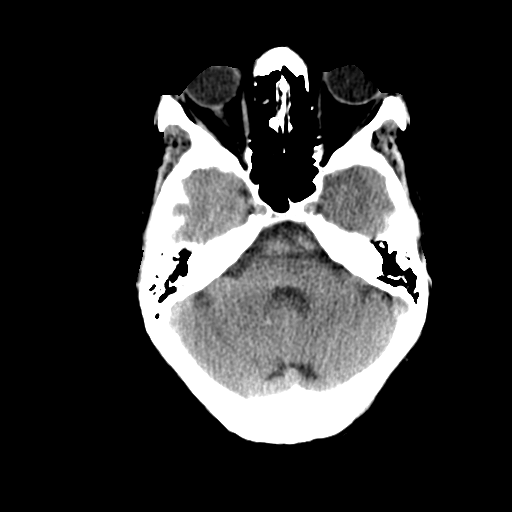
[im 11/32  brain]
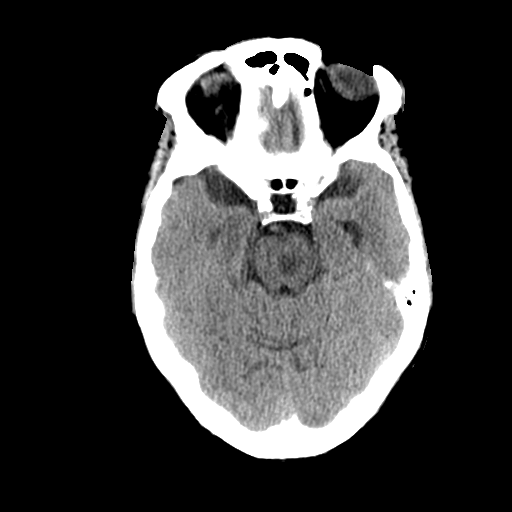
[im 14/32  brain]
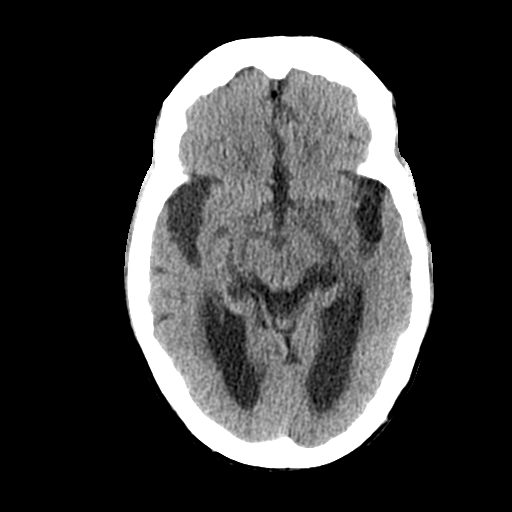
[im 14/32  bone]
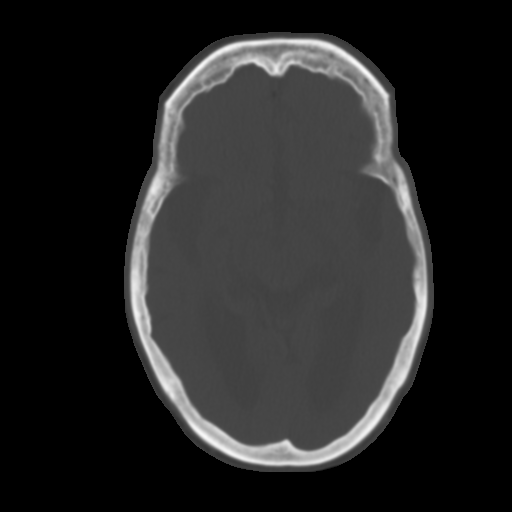
[im 18/32  brain]
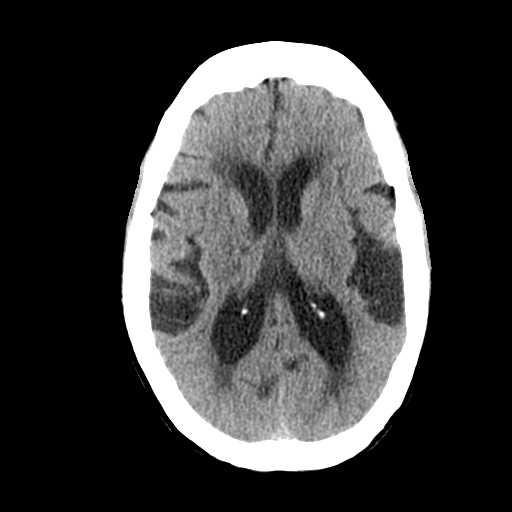
[im 21/32  brain]
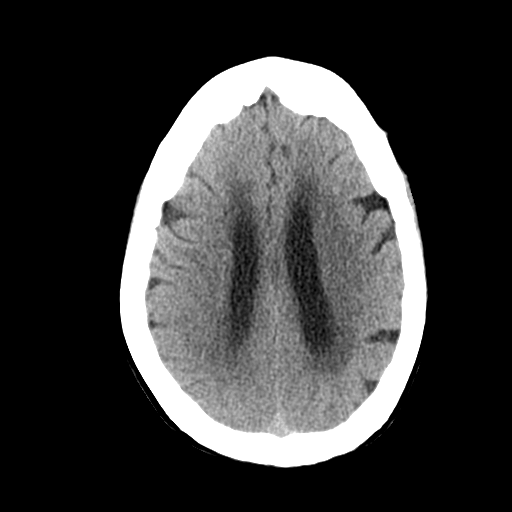
[im 24/32  brain]
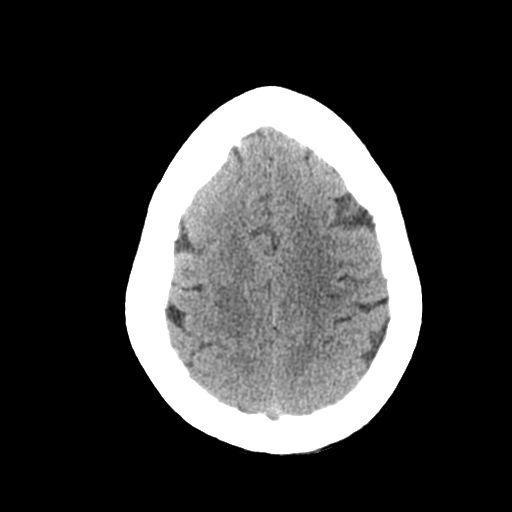
[im 26/32  brain]
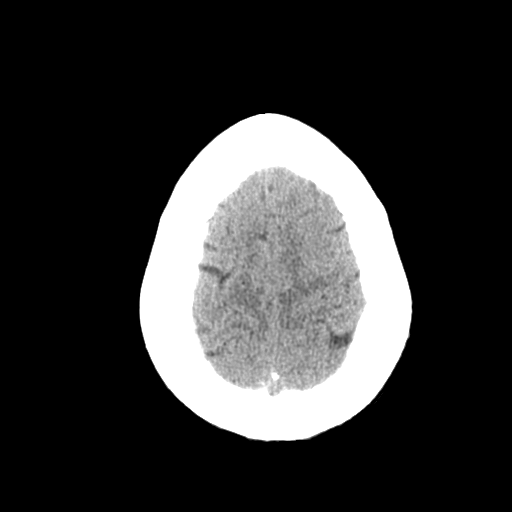
[im 26/32  bone]
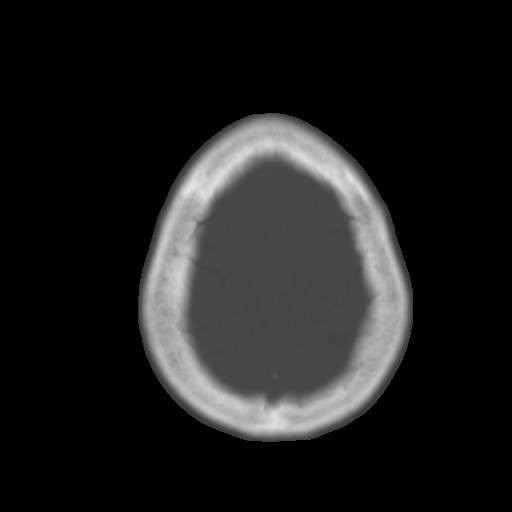
[im 29/32  brain]
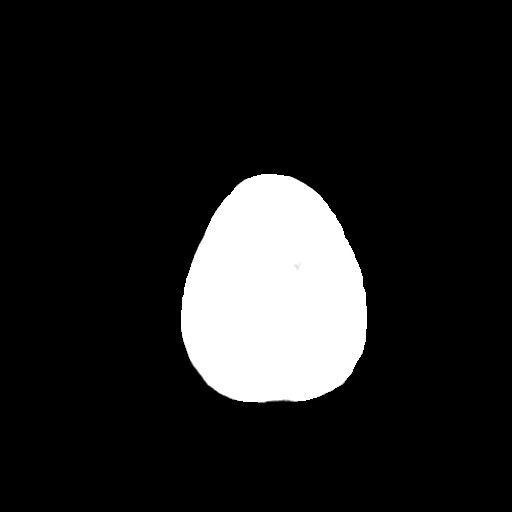

[Series 5: coronal soft tissue · coronal · 0.30mm/px · 3 of 69 slices shown]
[im 24/69  brain]
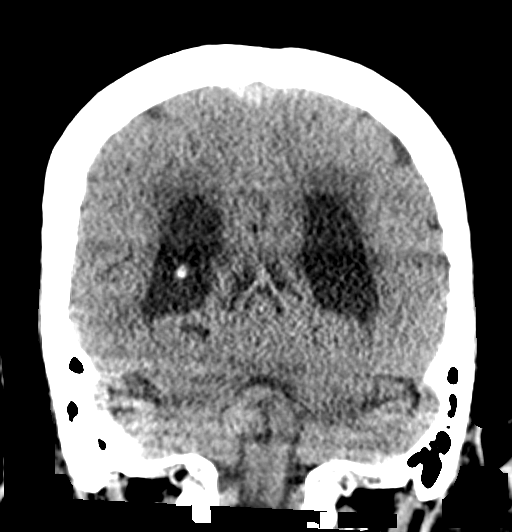
[im 31/69  brain]
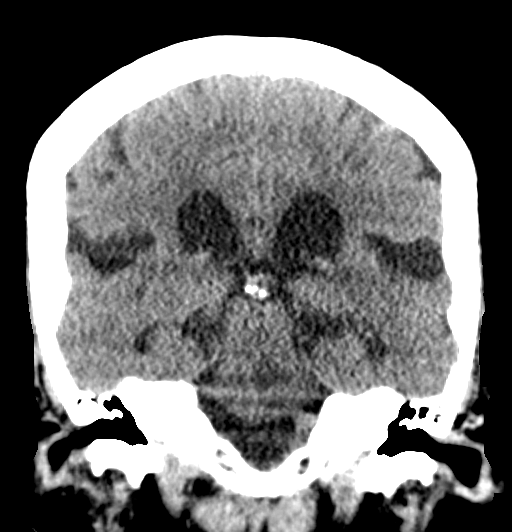
[im 38/69  brain]
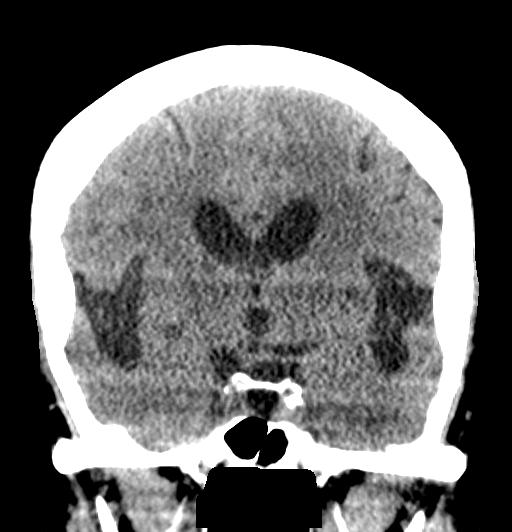

[Series 6: sagittal soft tissue · sagittal · 0.31mm/px · 3 of 52 slices shown]
[im 18/52  brain]
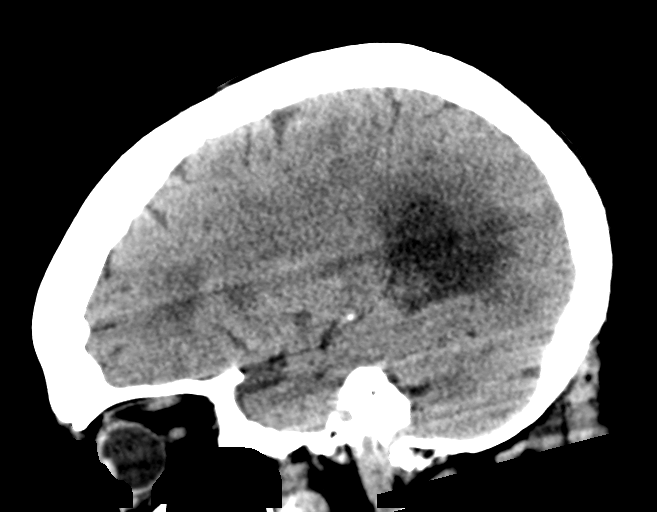
[im 26/52  brain]
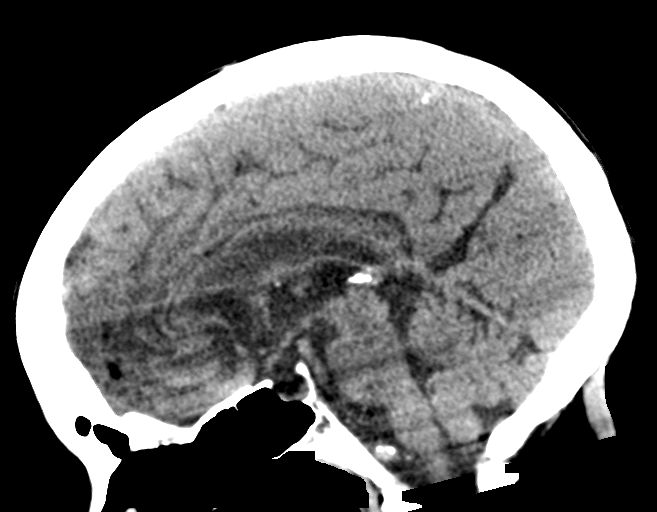
[im 35/52  brain]
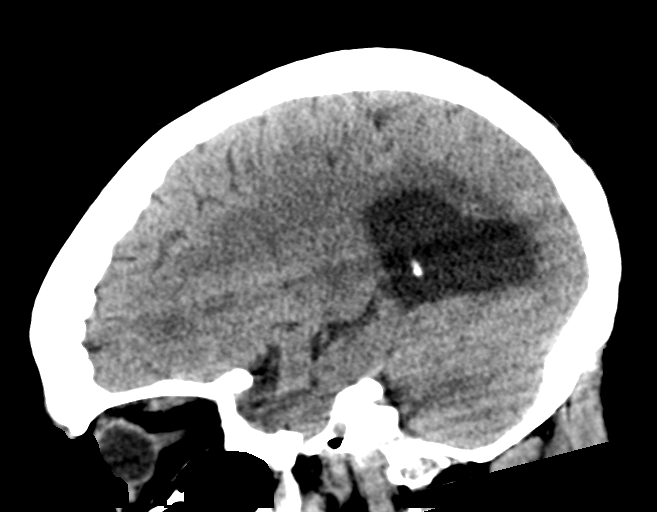

[16 of 47 positions shown; findings below may reference images not displayed]

FINDINGS: Brain: There is atrophy and chronic small vessel disease changes.
Previously seen acute left internal capsule infarct now visualized
by CT. Previously described left parietal infarct not well
visualized by CT. No new areas of infarction. No hemorrhage or
hydrocephalus.

Vascular: No hyperdense vessel or unexpected calcification.

Skull: No acute calvarial abnormality.

Sinuses/Orbits: Visualized paranasal sinuses and mastoids clear.
Orbital soft tissues unremarkable.

Other: None
IMPRESSION: Left posterior internal capsule lacunar infarct as seen on prior MRI
is now seen by CT. No new areas of infarction.

Atrophy, chronic small vessel disease.

## 2020-03-08 IMAGING — DX DG CHEST 1V PORT
1 series · 1 of 1 positions shown · non-contrast
Comparison: None.

CLINICAL DATA: Nausea and vomiting

EXAM:
PORTABLE CHEST 1 VIEW

[chest ap]
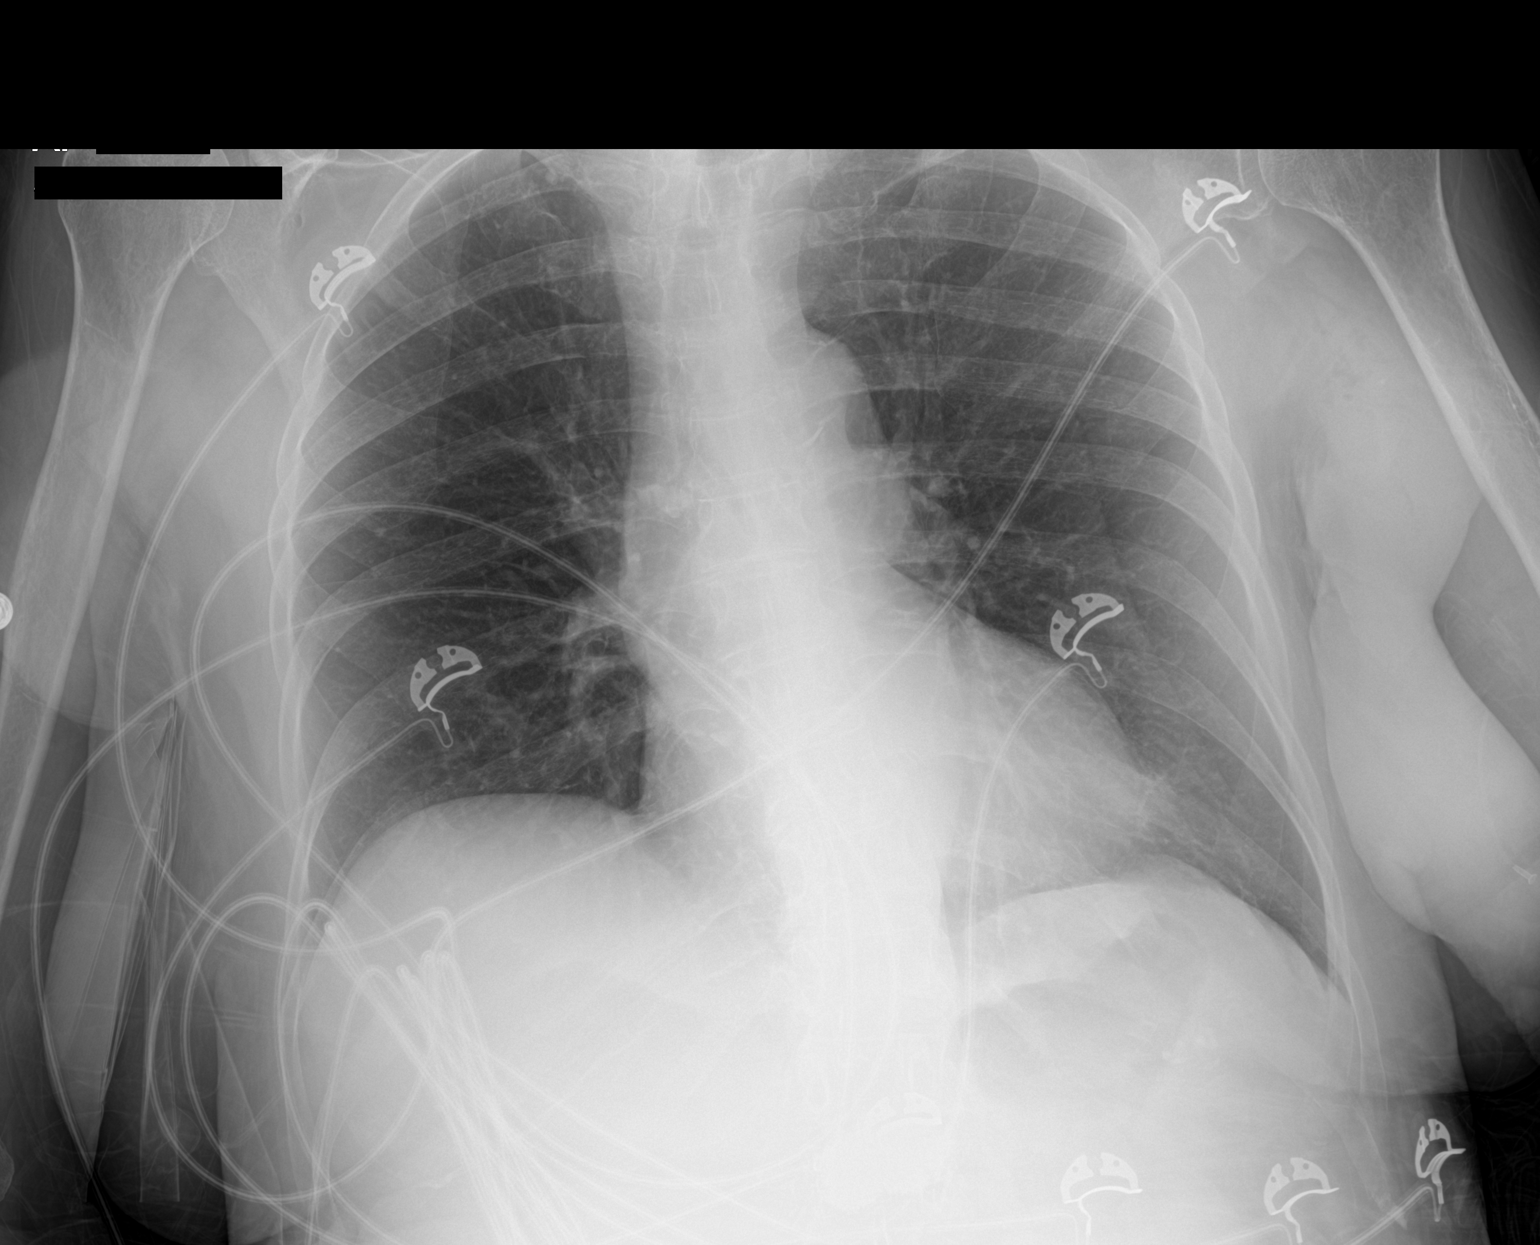

[1 of 1 positions shown; findings below may reference images not displayed]

FINDINGS: The heart size and mediastinal contours are within normal limits.
Both lungs are clear. No pleural effusion. The visualized skeletal
structures are unremarkable.
IMPRESSION: No acute process in the chest.

## 2020-03-08 IMAGING — CT CT ABD-PELV W/O CM
2 of 4 series · 15 of 46 positions shown, 17 images · non-contrast
Comparison: None.

CLINICAL DATA: Nausea and vomiting for 2 days.

EXAM:
CT ABDOMEN AND PELVIS WITHOUT CONTRAST
TECHNIQUE: Multidetector CT imaging of the abdomen and pelvis was performed
following the standard protocol without IV contrast.

[Series 3: axial st · axial · 0.72mm/px · z∈[+1025,+1425]mm · 12 of 94 slices shown, 14 images]
[im 7/94  soft-tissue]
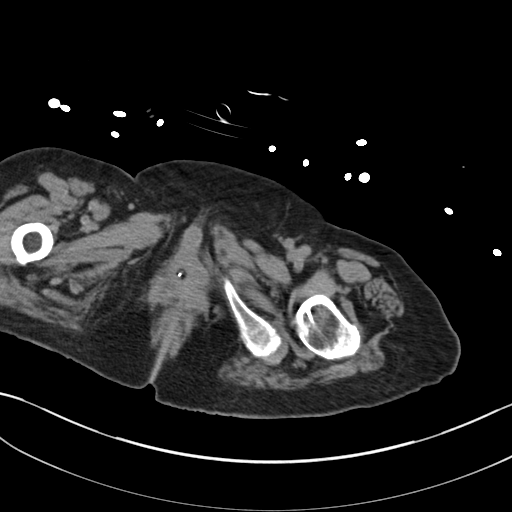
[im 7/94  bone]
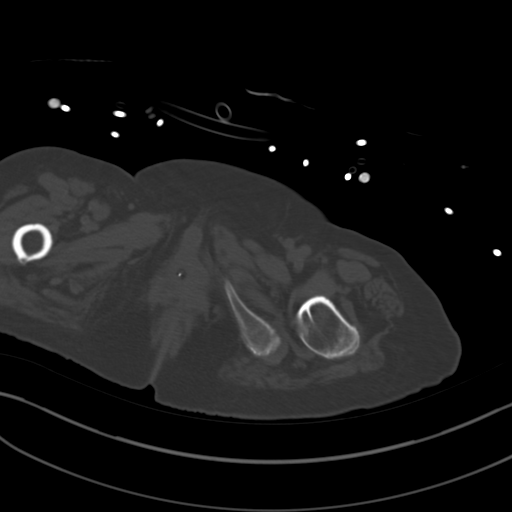
[im 13/94  soft-tissue]
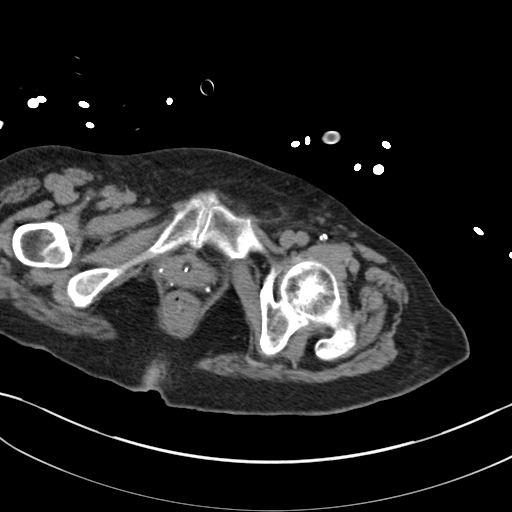
[im 19/94  soft-tissue]
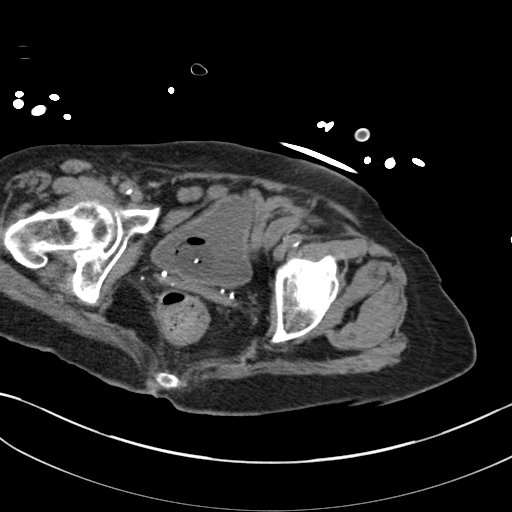
[im 32/94  soft-tissue]
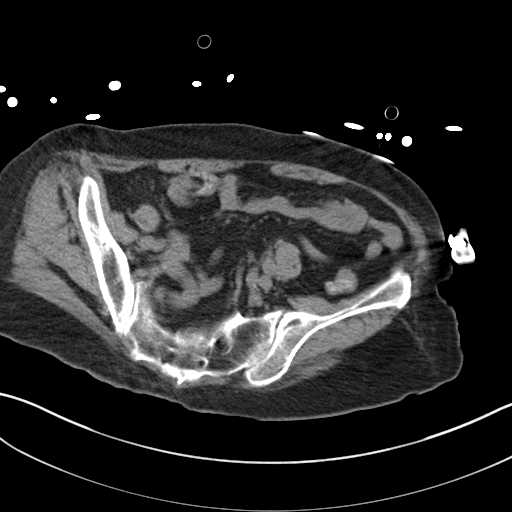
[im 38/94  soft-tissue]
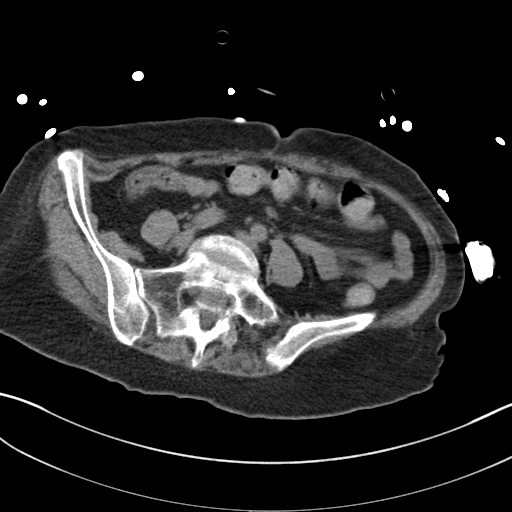
[im 44/94  soft-tissue]
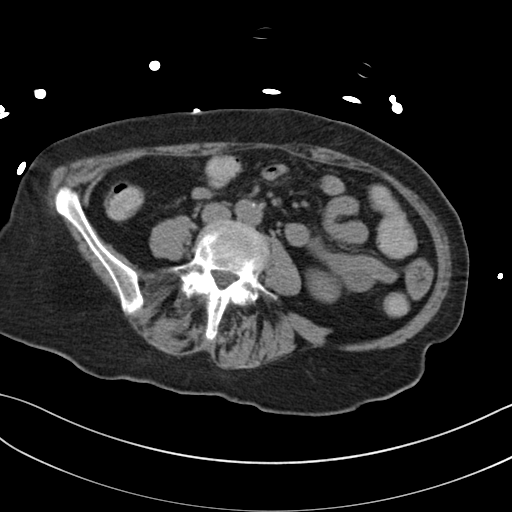
[im 50/94  soft-tissue]
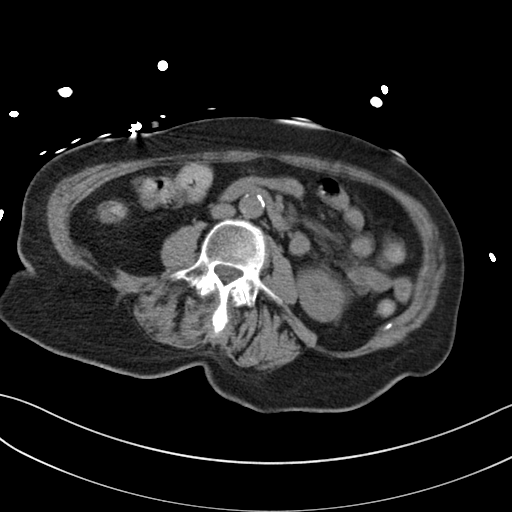
[im 56/94  soft-tissue]
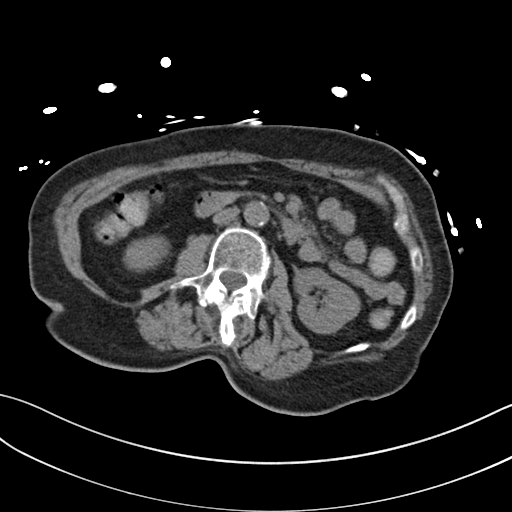
[im 63/94  soft-tissue]
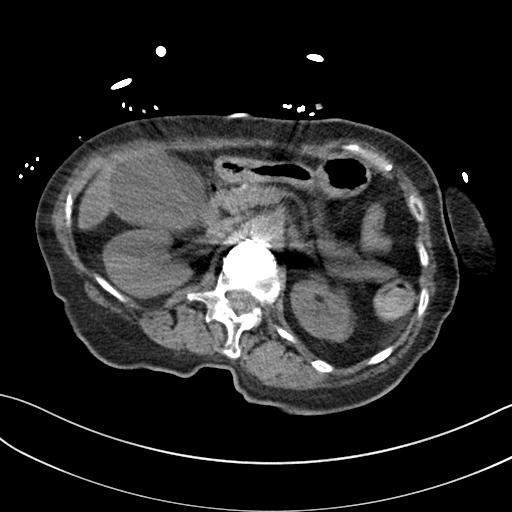
[im 63/94  bone]
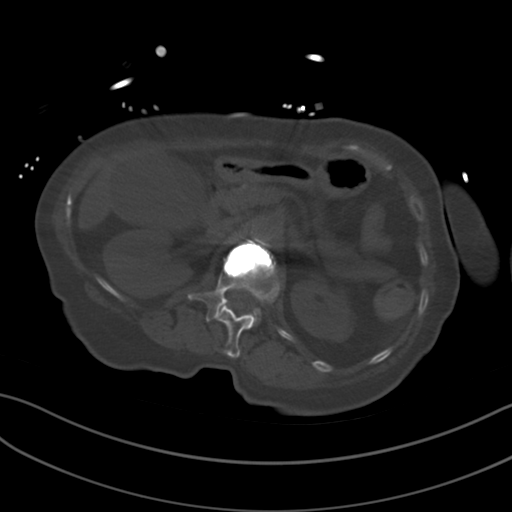
[im 75/94  soft-tissue]
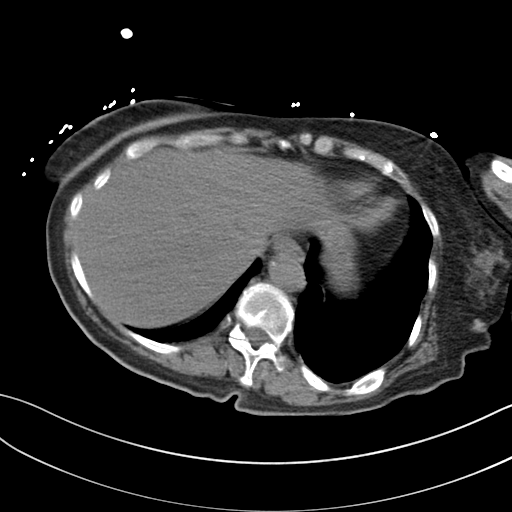
[im 81/94  soft-tissue]
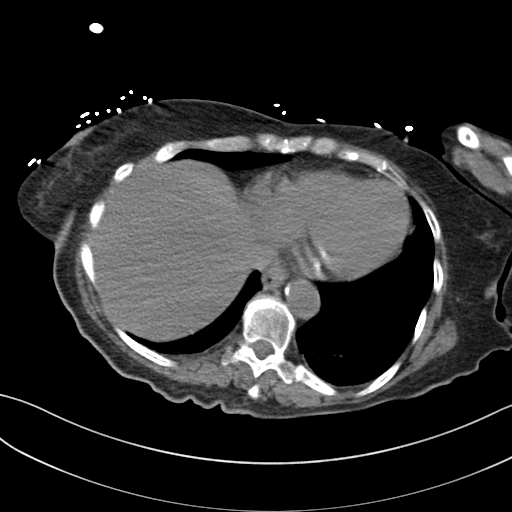
[im 87/94  soft-tissue]
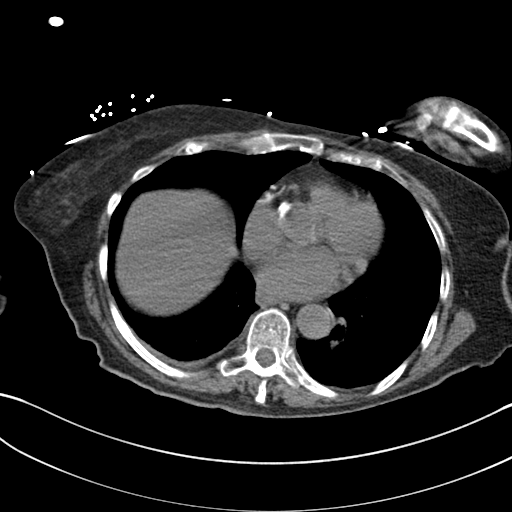

[Series 5: coronal st · coronal · 0.72mm/px · 3 of 117 slices shown]
[im 39/117  soft-tissue]
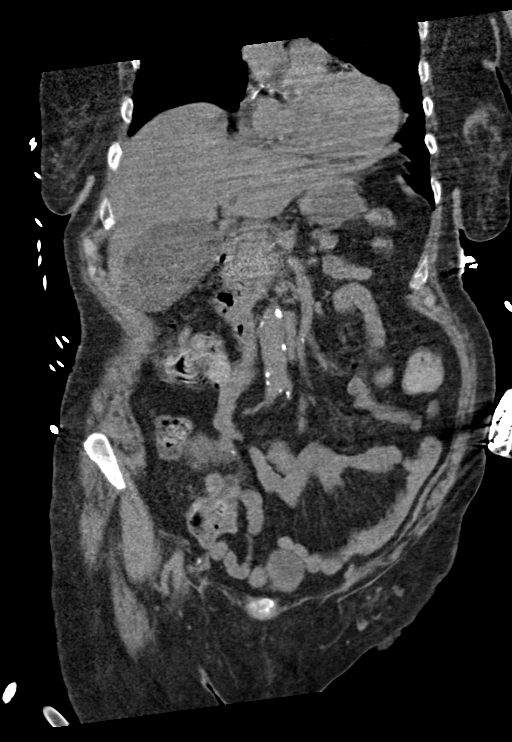
[im 52/117  soft-tissue]
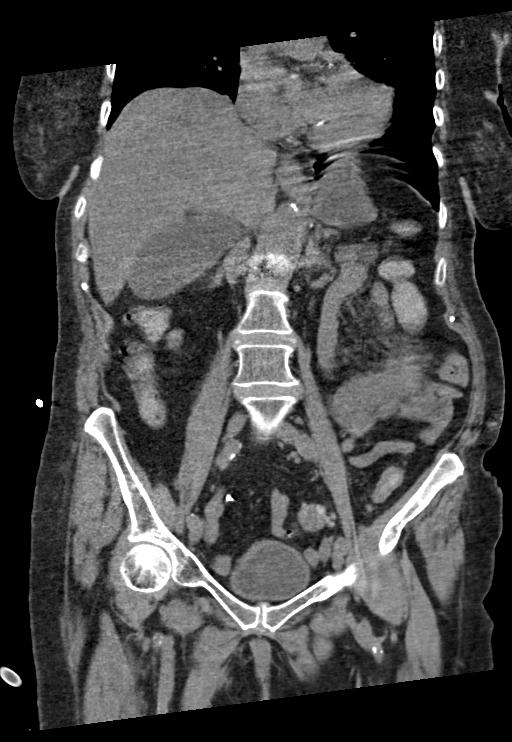
[im 65/117  soft-tissue]
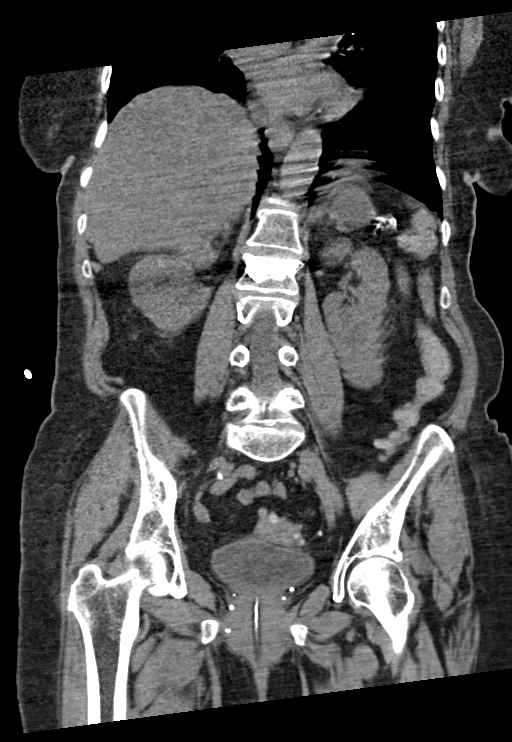

[15 of 46 positions shown; findings below may reference images not displayed]

FINDINGS: Lower chest: The lung bases are clear. No infiltrates or effusions.
The heart is normal in size. Aortic and coronary artery
calcifications are noted.

Hepatobiliary: No hepatic lesions are identified without contrast.
No intrahepatic biliary dilatation. The gallbladder is distended
measuring 5.6 cm in diameter. I do not see any obvious
pericholecystic inflammatory changes but recommend correlation with
any right upper quadrant abdominal pain. Right upper quadrant
ultrasound examination may be helpful for further evaluation if
clinically indicated.

No common bile duct dilatation.

Pancreas: Moderate pancreatic atrophy but no mass, inflammation or
ductal dilatation.

Spleen: Normal size. No focal lesions.

Adrenals/Urinary Tract: The adrenal glands and kidneys are grossly
normal. No renal, ureteral or bladder calculi or obvious mass
without contrast. There is a Foley catheter noted in the bladder.

Stomach/Bowel: The stomach, duodenum, small bowel and colon are
grossly normal without oral contrast. No acute inflammatory changes,
mass lesions or obstructive findings. The terminal ileum is normal.
The appendix is surgically absent. Moderate to advanced sigmoid
colon diverticulosis but no findings to suggest acute
diverticulitis.

Vascular/Lymphatic: Moderate atherosclerotic calcifications
involving the aorta and iliac arteries but no aneurysm. No
mesenteric or retroperitoneal mass or adenopathy.

Reproductive: Surgically absent.

Other: No pelvic mass or adenopathy. No free pelvic fluid
collections. No inguinal mass or adenopathy. No abdominal wall
hernia or subcutaneous lesions. Small inguinal hernias containing
fat.

Musculoskeletal: No significant bony findings. Vertebral
augmentation changes are noted at L2. No acute bony findings or
worrisome bone lesions.
IMPRESSION: 1. No acute abdominal/pelvic findings, mass lesions or adenopathy.
2. Distended gallbladder but no obvious pericholecystic inflammatory
changes or gallstones. Recommend correlation with any right upper
quadrant abdominal pain. Right upper quadrant ultrasound examination
may be helpful for further evaluation if clinically indicated.
3. No renal, ureteral or bladder calculi or obvious mass without
contrast.
4. Sigmoid colon diverticulosis without findings for acute
diverticulitis.

Aortic Atherosclerosis ([3K]-[3K]).

## 2020-03-08 IMAGING — US US ABDOMEN LIMITED
1 series · 14 of 25 positions shown · non-contrast
Comparison: CT abdomen pelvis from same day.

CLINICAL DATA: Elevated lipase.

EXAM:
ULTRASOUND ABDOMEN LIMITED RIGHT UPPER QUADRANT

[Series 1: us abdomen limited · 14 of 40 slices shown]
[im 1/40]
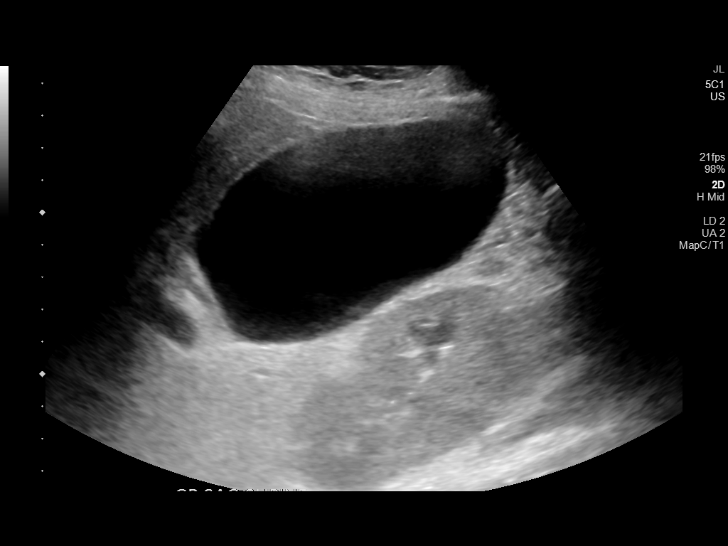
[im 4/40]
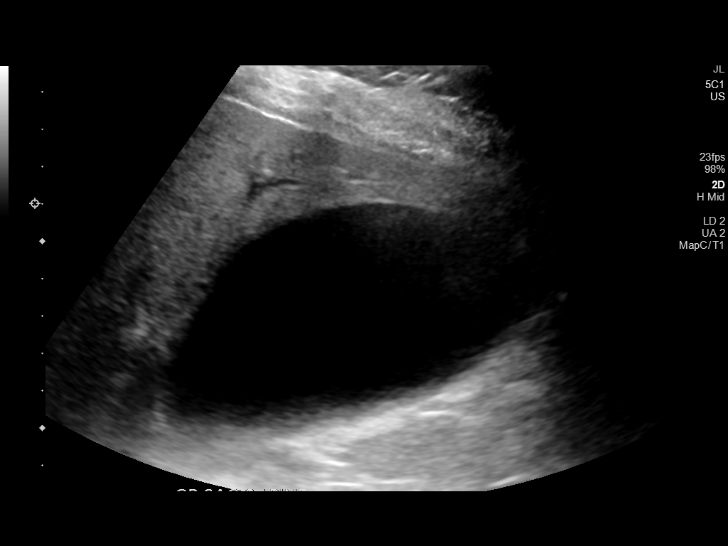
[im 7/40]
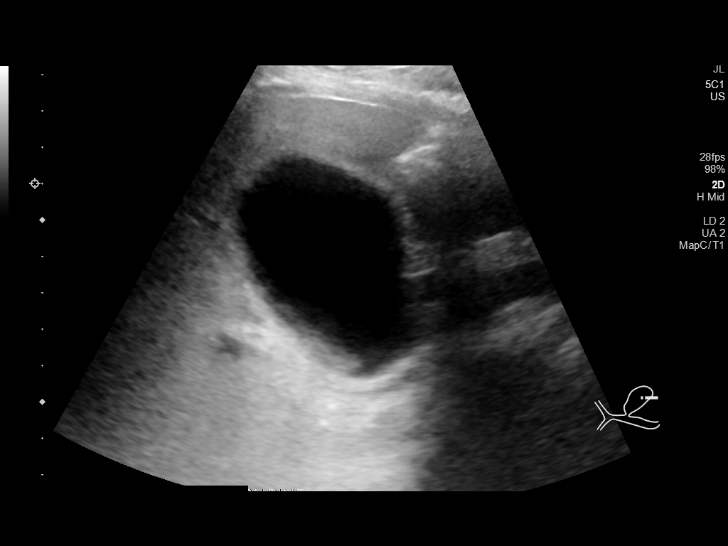
[im 10/40]
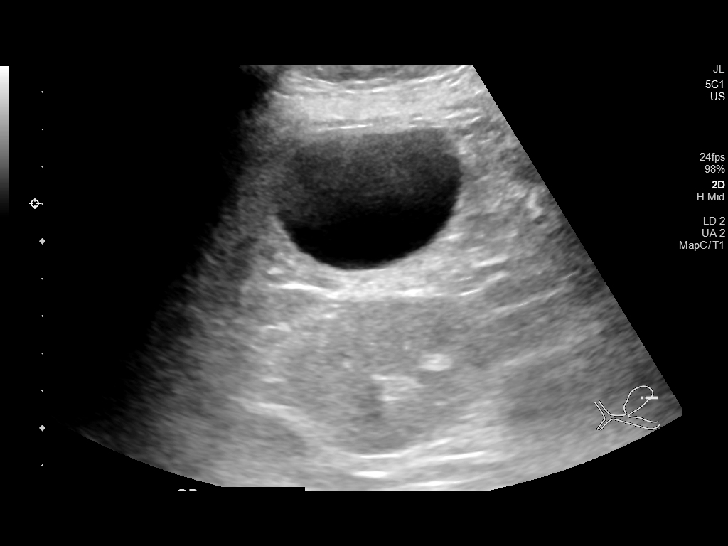
[im 14/40]
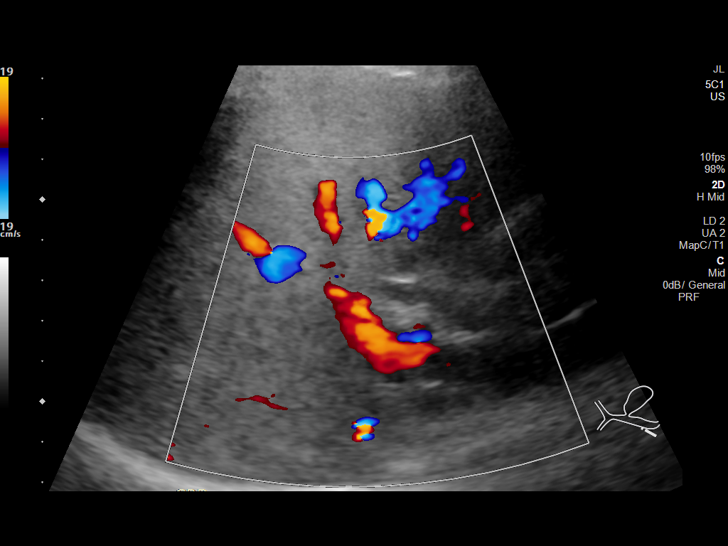
[im 15/40]
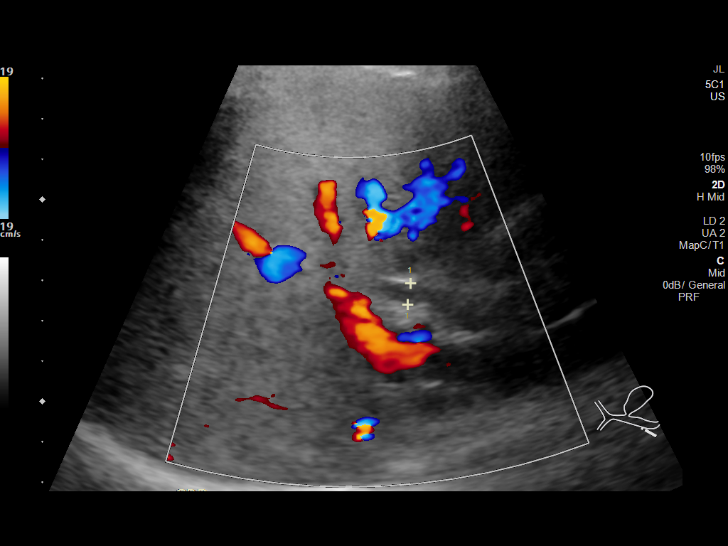
[im 18/40]
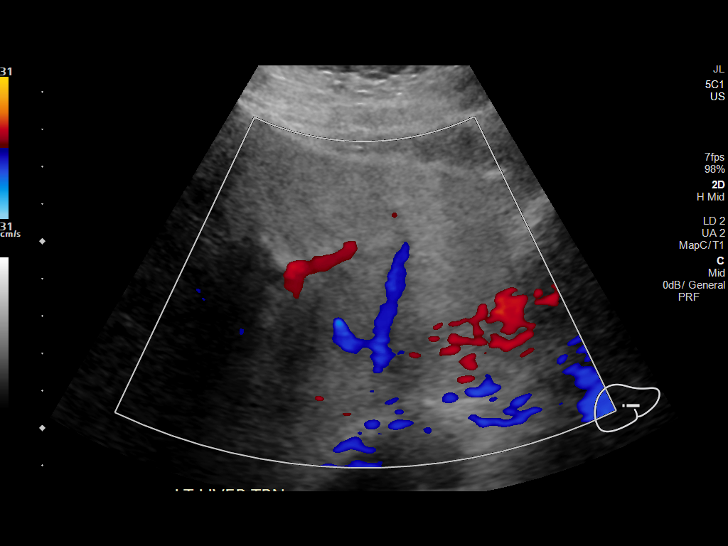
[im 22/40]
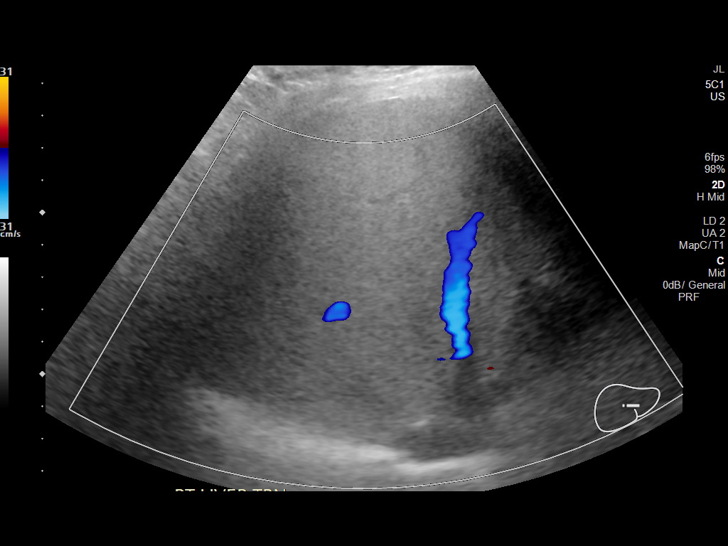
[im 25/40]
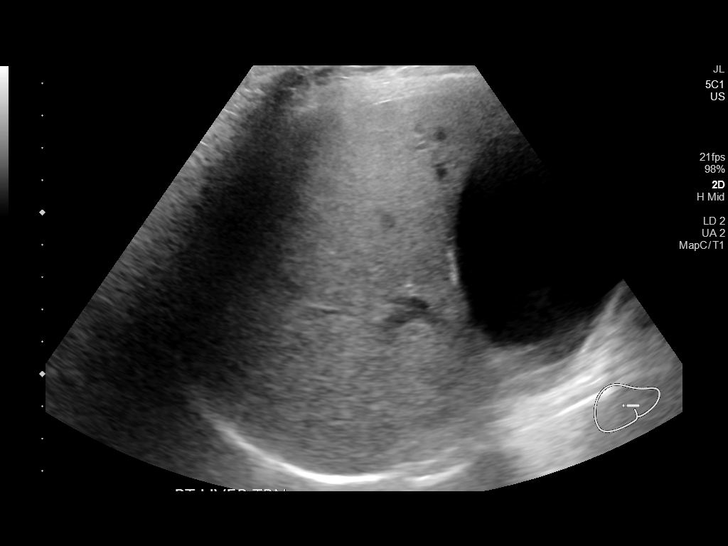
[im 27/40]
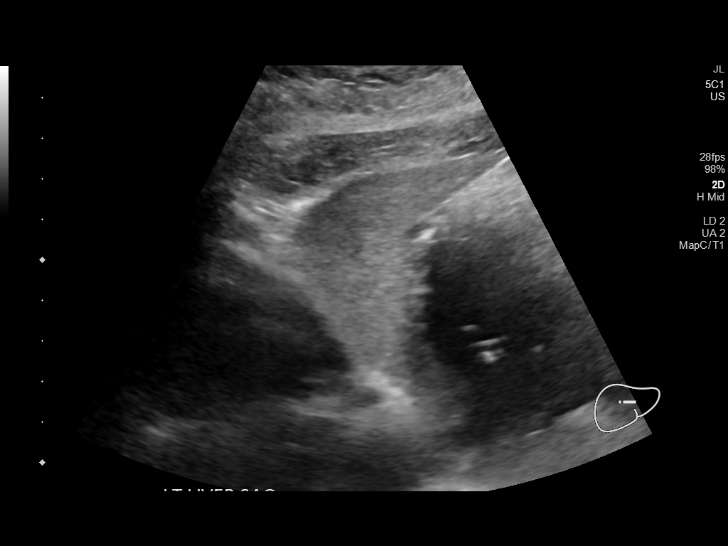
[im 30/40]
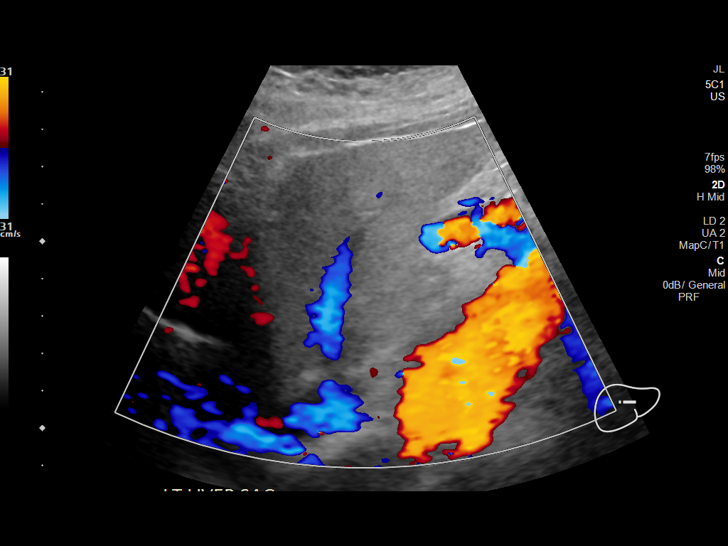
[im 33/40]
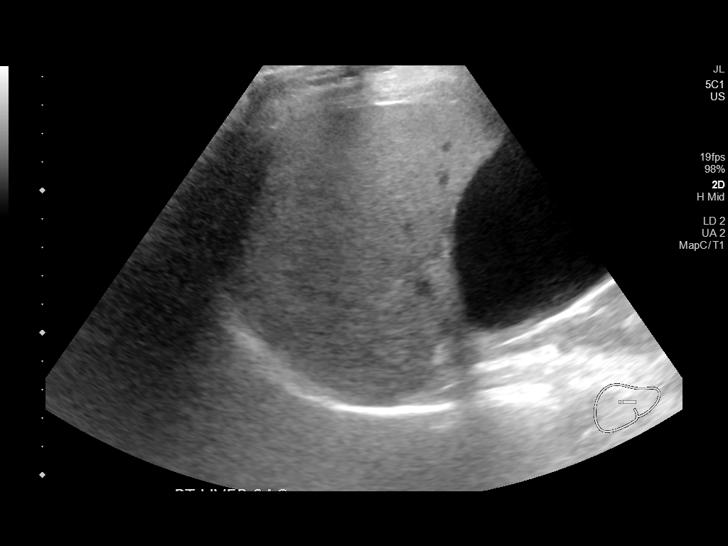
[im 36/40]
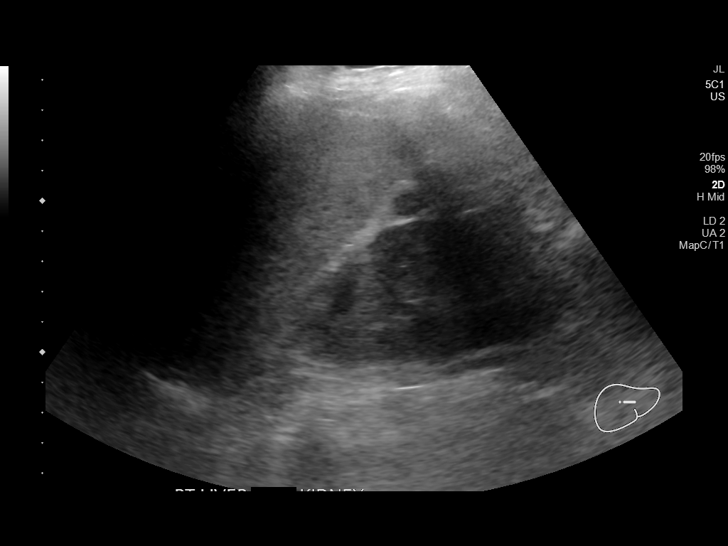
[im 40/40]
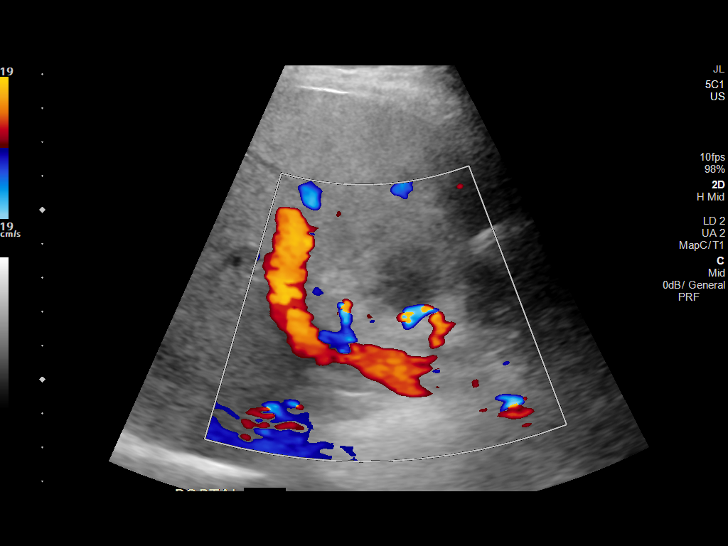

[14 of 25 positions shown; findings below may reference images not displayed]

FINDINGS: Gallbladder:

Distended with small amount of sludge. No gallstones or wall
thickening visualized. No sonographic Murphy sign noted by
sonographer.

Common bile duct:

Diameter: 5 mm, normal.

Liver:

No focal lesion identified. Diffusely increased in parenchymal
echogenicity. Portal vein is patent on color Doppler imaging with
normal direction of blood flow towards the liver.

Other: None.
IMPRESSION: 1. Distended gallbladder with small amount of sludge. No sonographic
evidence of acute cholecystitis.
2. Hepatic steatosis.

## 2020-03-08 IMAGING — US US RENAL
1 series · 14 of 25 positions shown · non-contrast
Comparison: Right upper quadrant ultrasound dated [DATE].

CLINICAL DATA: 73-year-old female with acute renal insufficiency.
History of hypertension and diabetes.

EXAM:
RENAL / URINARY TRACT ULTRASOUND COMPLETE

[Series 1: us renal · 14 of 27 slices shown]
[im 1/27]
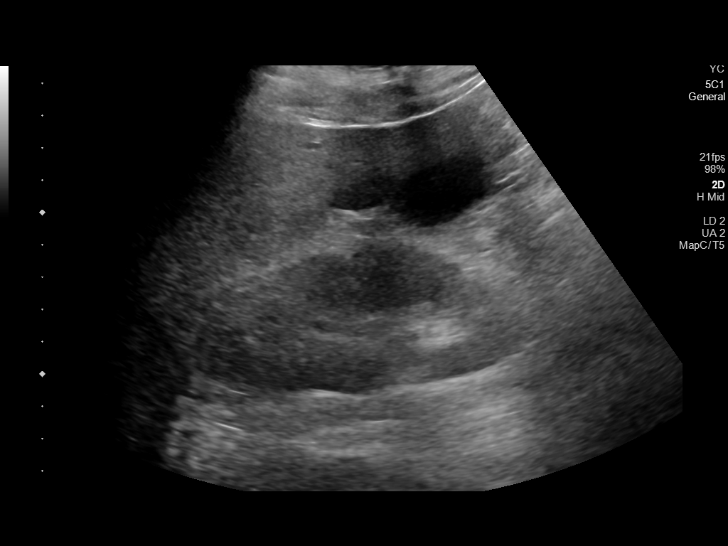
[im 3/27]
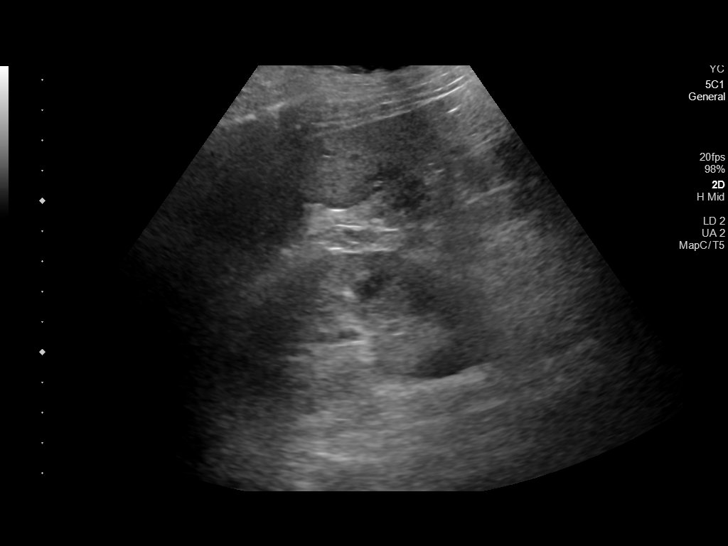
[im 5/27]
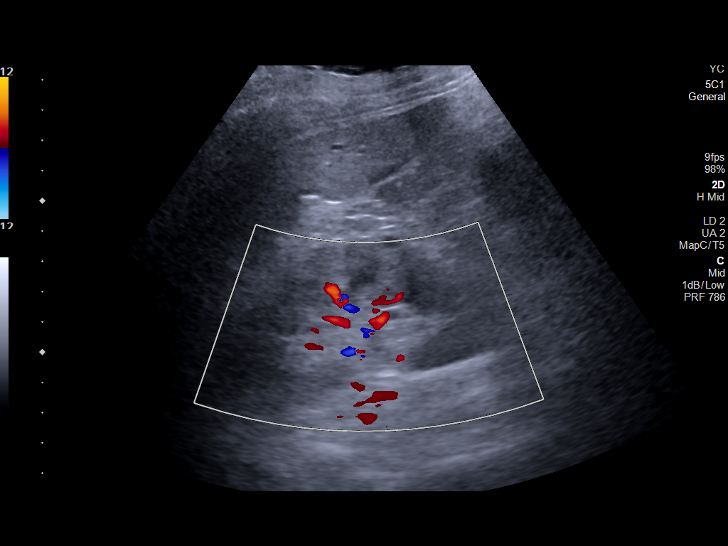
[im 7/27]
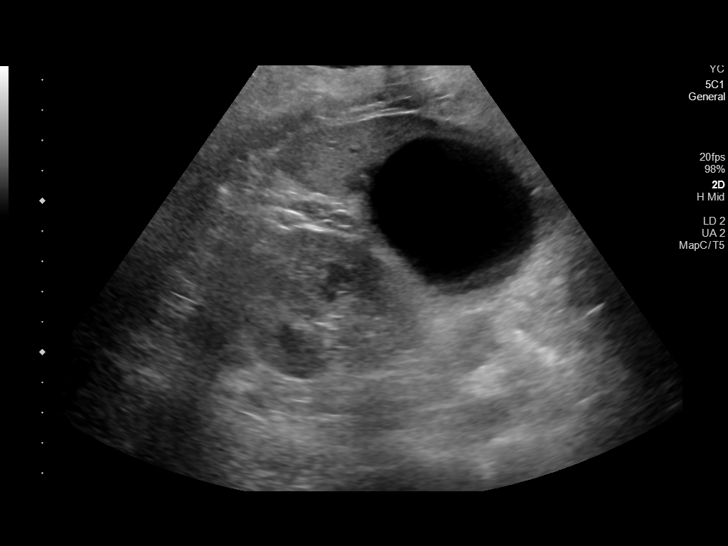
[im 9/27]
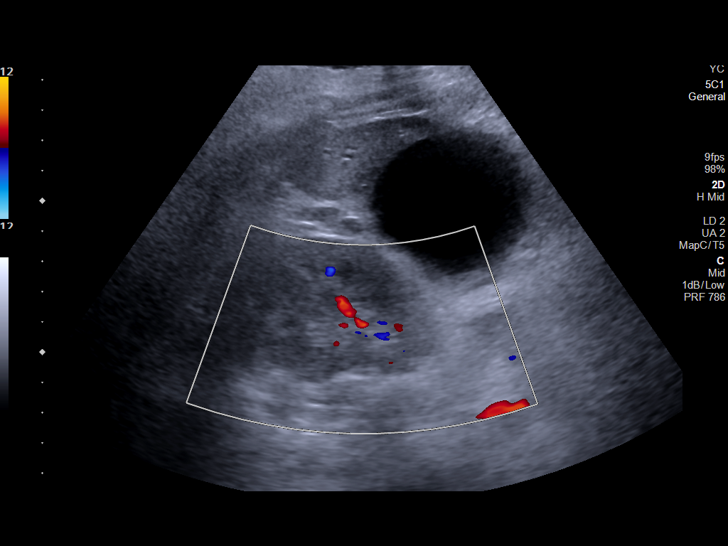
[im 10/27]
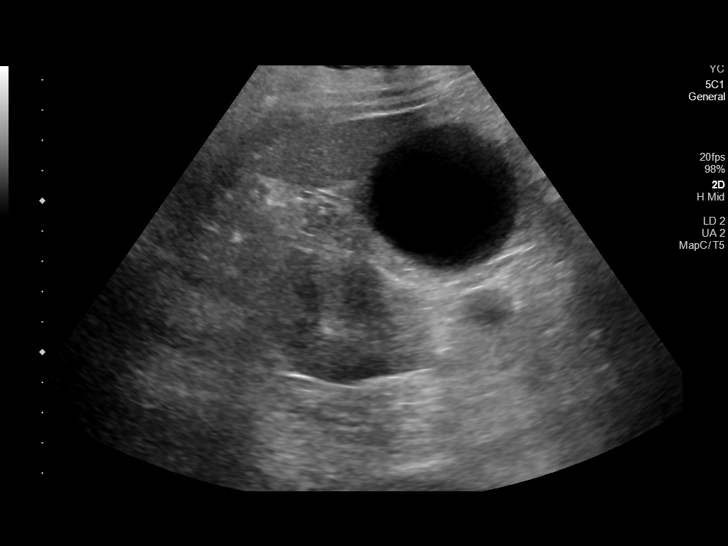
[im 12/27]
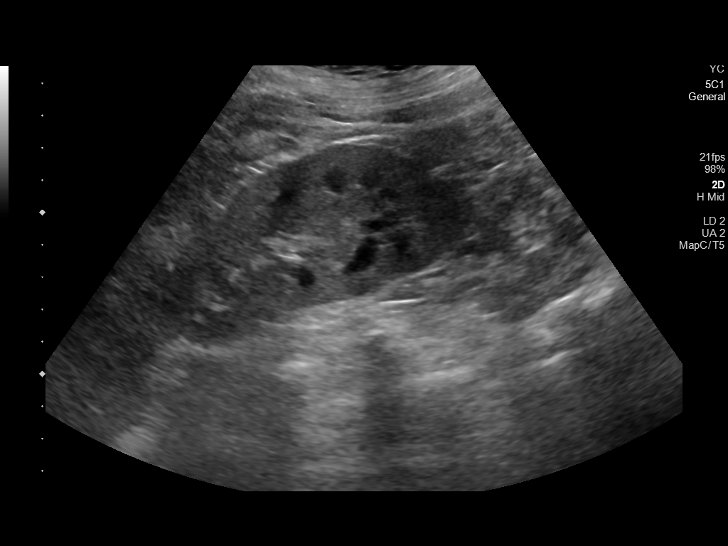
[im 15/27]
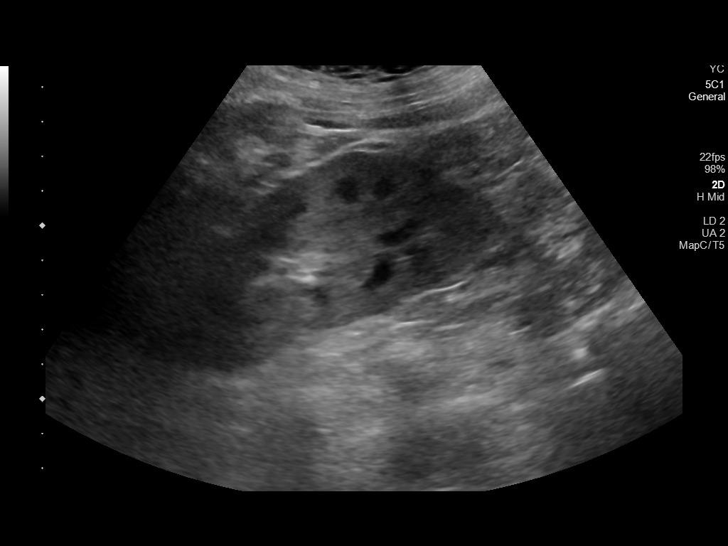
[im 17/27]
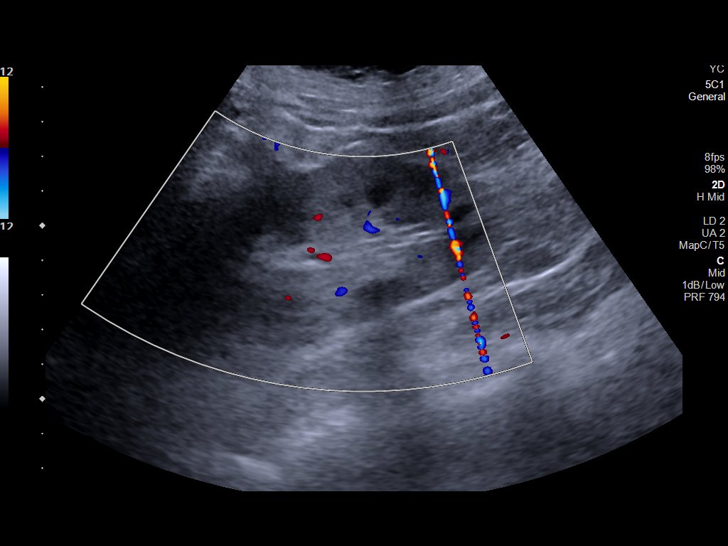
[im 18/27]
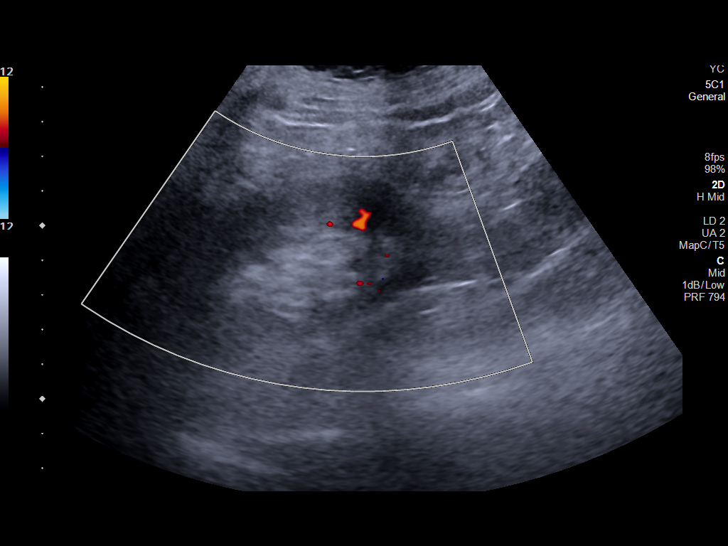
[im 20/27]
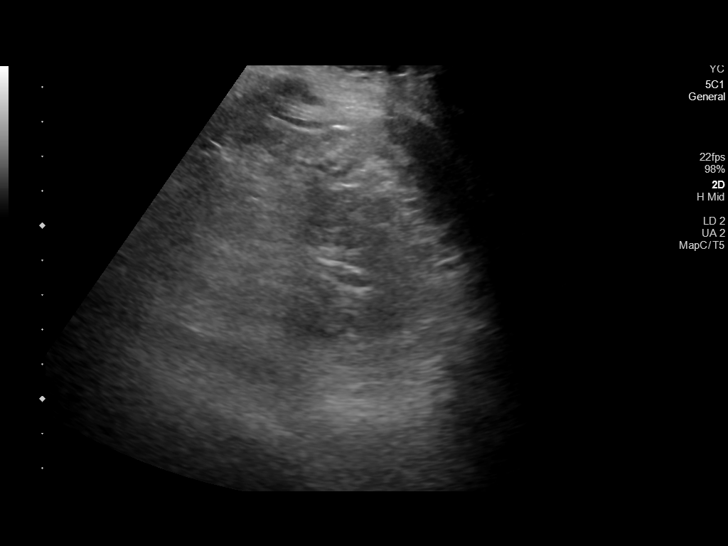
[im 22/27]
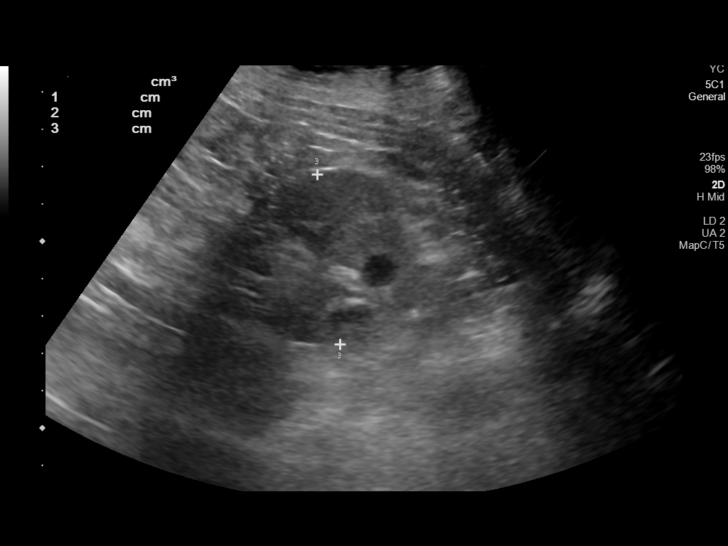
[im 24/27]
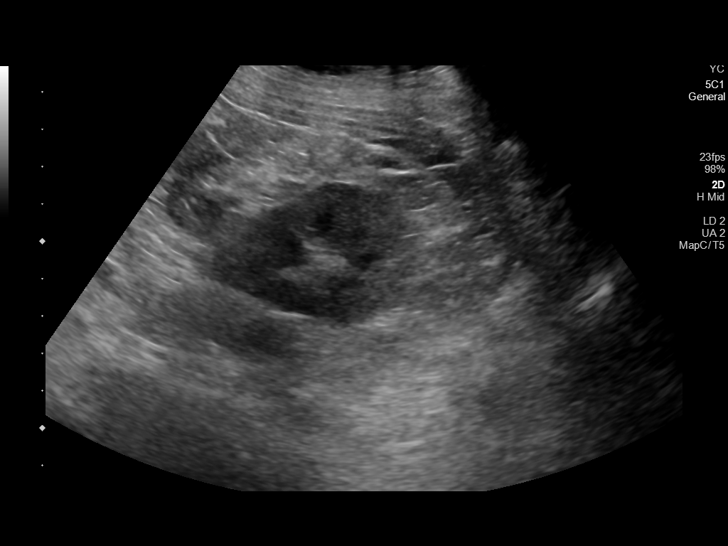
[im 27/27]
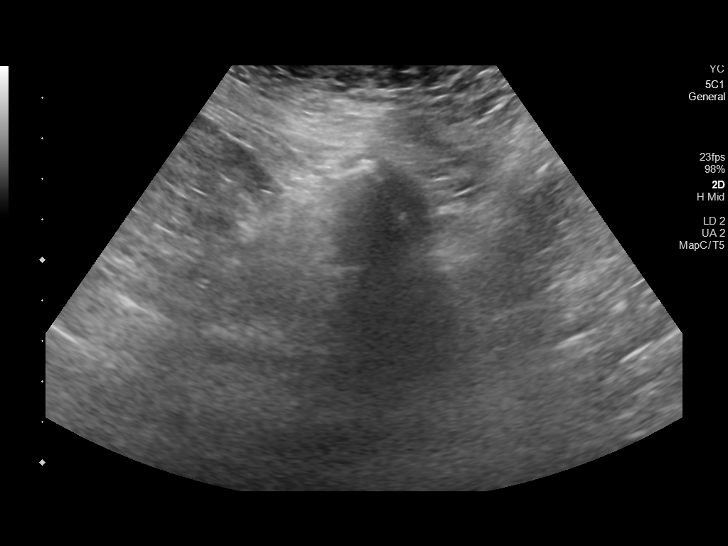

[14 of 25 positions shown; findings below may reference images not displayed]

FINDINGS: Right Kidney:

Renal measurements: 10.5 x 4.6 x 5.3 cm = volume: 133 mL. Apparent
increased echogenicity which may be artifactual and related to body
habitus and technique. No hydronephrosis or shadowing stone.

Left Kidney:

Renal measurements: 10.2 x 4.6 x 4.6 cm = volume: 111 mL. Apparent
increased parenchymal echogenicity which may be artifactual or
related to body habitus and technique. No hydronephrosis or
shadowing stone.

Bladder:

The urinary bladder is decompressed around a Foley catheter and not
visualized.

Other:

None.
IMPRESSION: No hydronephrosis or shadowing stone.

## 2020-03-08 MED ORDER — STERILE WATER FOR INJECTION IV SOLN
INTRAVENOUS | Status: AC
  Filled 2020-03-08 (×2): qty 850
  Filled 2020-03-08 (×3): qty 150

## 2020-03-08 MED ORDER — SODIUM CHLORIDE 0.9 % IV BOLUS
500.0000 mL | Freq: Once | INTRAVENOUS | Status: AC
  Administered 2020-03-08: 500 mL via INTRAVENOUS

## 2020-03-08 MED ORDER — SODIUM CHLORIDE 0.9 % IV SOLN
80.0000 mg | Freq: Once | INTRAVENOUS | Status: AC
  Administered 2020-03-08: 80 mg via INTRAVENOUS
  Filled 2020-03-08: qty 80

## 2020-03-08 MED ORDER — CHLORHEXIDINE GLUCONATE CLOTH 2 % EX PADS
6.0000 | MEDICATED_PAD | Freq: Every day | CUTANEOUS | Status: DC
  Administered 2020-03-08 – 2020-03-11 (×4): 6 via TOPICAL

## 2020-03-08 MED ORDER — VANCOMYCIN VARIABLE DOSE PER UNSTABLE RENAL FUNCTION (PHARMACIST DOSING): Status: DC

## 2020-03-08 MED ORDER — SODIUM CHLORIDE 0.9 % IV SOLN
1.0000 g | INTRAVENOUS | Status: DC
  Administered 2020-03-09: 1 g via INTRAVENOUS
  Filled 2020-03-08 (×2): qty 1

## 2020-03-08 MED ORDER — ORAL CARE MOUTH RINSE
15.0000 mL | Freq: Two times a day (BID) | OROMUCOSAL | Status: DC
  Administered 2020-03-08 – 2020-04-27 (×93): 15 mL via OROMUCOSAL

## 2020-03-08 MED ORDER — PANTOPRAZOLE SODIUM 40 MG IV SOLR
40.0000 mg | Freq: Two times a day (BID) | INTRAVENOUS | Status: DC
  Administered 2020-03-12 (×2): 40 mg via INTRAVENOUS
  Filled 2020-03-08 (×2): qty 40

## 2020-03-08 MED ORDER — SODIUM CHLORIDE 0.9 % IV SOLN: INTRAVENOUS | Status: DC

## 2020-03-08 MED ORDER — SODIUM CHLORIDE 0.9 % IV SOLN
8.0000 mg/h | INTRAVENOUS | Status: AC
  Administered 2020-03-08 – 2020-03-11 (×7): 8 mg/h via INTRAVENOUS
  Filled 2020-03-08 (×6): qty 80
  Filled 2020-03-08: qty 48
  Filled 2020-03-08 (×4): qty 80

## 2020-03-08 MED ORDER — HEPARIN SODIUM (PORCINE) 5000 UNIT/ML IJ SOLN
5000.0000 [IU] | Freq: Three times a day (TID) | INTRAMUSCULAR | Status: DC
  Administered 2020-03-08: 5000 [IU] via SUBCUTANEOUS
  Filled 2020-03-08: qty 1

## 2020-03-08 MED ORDER — INSULIN ASPART 100 UNIT/ML ~~LOC~~ SOLN
0.0000 [IU] | SUBCUTANEOUS | Status: DC
  Administered 2020-03-08: 2 [IU] via SUBCUTANEOUS

## 2020-03-08 MED ORDER — SODIUM CHLORIDE 0.9 % IV SOLN
2.0000 g | Freq: Once | INTRAVENOUS | Status: AC
  Administered 2020-03-08: 2 g via INTRAVENOUS
  Filled 2020-03-08: qty 2

## 2020-03-08 MED ORDER — ACETAMINOPHEN 325 MG PO TABS
650.0000 mg | ORAL_TABLET | Freq: Four times a day (QID) | ORAL | Status: DC | PRN
  Administered 2020-03-12: 650 mg via ORAL
  Filled 2020-03-08 (×2): qty 2

## 2020-03-08 MED ORDER — ONDANSETRON HCL 4 MG/2ML IJ SOLN
4.0000 mg | Freq: Three times a day (TID) | INTRAMUSCULAR | Status: DC | PRN
  Administered 2020-03-12 – 2020-04-01 (×2): 4 mg via INTRAVENOUS
  Filled 2020-03-08 (×3): qty 2

## 2020-03-08 MED ORDER — SODIUM CHLORIDE 0.9 % IV BOLUS
1000.0000 mL | Freq: Once | INTRAVENOUS | Status: AC
  Administered 2020-03-08: 1000 mL via INTRAVENOUS

## 2020-03-08 MED ORDER — SODIUM ZIRCONIUM CYCLOSILICATE 10 G PO PACK
10.0000 g | PACK | Freq: Two times a day (BID) | ORAL | Status: DC
  Administered 2020-03-08: 10 g via ORAL
  Filled 2020-03-08 (×3): qty 1

## 2020-03-08 MED ORDER — ENOXAPARIN SODIUM 40 MG/0.4ML ~~LOC~~ SOLN: 40.0000 mg | SUBCUTANEOUS | Status: DC

## 2020-03-08 MED ORDER — ONDANSETRON HCL 4 MG/2ML IJ SOLN
4.0000 mg | Freq: Once | INTRAMUSCULAR | Status: AC
  Administered 2020-03-08: 4 mg via INTRAVENOUS
  Filled 2020-03-08: qty 2

## 2020-03-08 MED ORDER — POLYETHYLENE GLYCOL 3350 17 G PO PACK
17.0000 g | PACK | Freq: Every day | ORAL | Status: DC | PRN
  Administered 2020-03-21 – 2020-03-24 (×3): 17 g via ORAL
  Filled 2020-03-08 (×4): qty 1

## 2020-03-08 MED ORDER — ACETAMINOPHEN 650 MG RE SUPP: 650.0000 mg | Freq: Four times a day (QID) | RECTAL | Status: DC | PRN

## 2020-03-08 MED ORDER — VANCOMYCIN HCL IN DEXTROSE 1-5 GM/200ML-% IV SOLN
1000.0000 mg | Freq: Once | INTRAVENOUS | Status: AC
  Administered 2020-03-08: 1000 mg via INTRAVENOUS
  Filled 2020-03-08: qty 200

## 2020-03-08 MED ORDER — CLOPIDOGREL BISULFATE 75 MG PO TABS: 75.0000 mg | ORAL_TABLET | Freq: Every day | ORAL | Status: DC

## 2020-03-08 MED ORDER — METOPROLOL TARTRATE 25 MG PO TABS: 25.0000 mg | ORAL_TABLET | Freq: Two times a day (BID) | ORAL | Status: DC

## 2020-03-08 NOTE — Progress Notes (Signed)
CRITICAL VALUE ALERT  Critical Value: follow up on several repeat labs   Lactic >11, Cr. 9.3, K 5.7  Date & Time Notied:  1510  Provider Notified: Dr. Lowell Guitar  Orders Received/Actions taken: maintain current care at this time, nephrology to follow

## 2020-03-08 NOTE — ED Provider Notes (Signed)
[facility]   CSN: [id number] Arrival date & time: 03/08/20  1324     History Chief Complaint  Patient presents with  . Emesis    Tasha [name] is a 74 y.o. female.  74 year old female with prior medical history detailed below presents for evaluation of nausea vomiting.  Patient reports 2 to 3 days of nausea and vomiting.  She has not been able to take anything p.o.  Denies abdominal pain.  She denies fever.  Level 5 caveat secondary to prior CVA.  The history is provided by the patient.  Emesis Severity:  Moderate Duration:  3 days Timing:  Constant Progression:  Unchanged Chronicity:  New Recent urination:  Decreased Relieved by:  Nothing Worsened by:  Nothing      Past Medical History:  Diagnosis Date  . Diabetes mellitus without complication (HCC)   . Essential (primary) hypertension   . Essential (primary) hypertension   . Essential (primary) hypertension   . History of falling   . Hypertension   . MI (myocardial infarction) (HCC)   . Stroke [facility])     Patient Active Problem List   Diagnosis Date Noted  . Essential (primary) hypertension   . History of falling   . Diabetes mellitus without complication (HCC)   . DM (diabetes mellitus), type 2 with peripheral vascular complications (HCC) 02/20/2020  . Atherosclerosis of aorta (HCC) 02/20/2020  . Poorly-controlled hypertension 02/20/2020  . Dyslipidemia 02/20/2020  . Fall 02/15/2020  . CVA (cerebral vascular accident) (HCC) 02/15/2020    Past Surgical History:  Procedure Laterality Date  . ABDOMINAL HYSTERECTOMY    . APPENDECTOMY    . BACK SURGERY       OB History   No obstetric history on file.     Family History  Adopted: Yes  Family history unknown: Yes    Social History   Tobacco Use  . Smoking status: Never Smoker  . Smokeless tobacco: Never Used  Vaping Use  . Vaping Use: Never used  Substance Use Topics  . Alcohol use: Yes     Comment: occasionally  . Drug use: Never    Home Medications Prior to Admission medications   Medication Sig Start Date End Date Taking? Authorizing Provider  amLODipine (NORVASC) 10 MG tablet Take 1 tablet (10 mg total) by mouth daily. 02/20/20 03/21/20 Yes Dellia Cloud, MD  bisacodyl (DULCOLAX) 10 MG suppository Place 10 mg rectally as needed for moderate constipation. If MOM doesn't work   Yes [provider]  clopidogrel (PLAVIX) 75 MG tablet Take 1 tablet (75 mg total) by mouth daily. 02/20/20 03/21/20 Yes Dellia Cloud, MD  Dextromethorphan-quiNIDine (NUEDEXTA) 20-10 MG capsule Take 1 capsule by mouth daily. 02/24/20  Yes Medina-Vargas, Monina C, NP  hydrochlorothiazide (HYDRODIURIL) 25 MG tablet Take 1 tablet (25 mg total) by mouth daily. 02/20/20 03/21/20 Yes Dellia Cloud, MD  loratadine (CLARITIN) 10 MG tablet Take 10 mg by mouth daily as needed for allergies.   Yes [provider]  magnesium hydroxide (MILK OF MAGNESIA) 400 MG/5ML suspension Take 30 mLs by mouth daily as needed for mild constipation. If no BM in 3 days   Yes [provider]  metFORMIN (GLUCOPHAGE) 1000 MG tablet Take 1 tablet (1,000 mg total) by mouth daily with breakfast. 02/19/20 03/20/20 Yes Dellia Cloud, MD  metoprolol tartrate (LOPRESSOR) 25 MG tablet Take 25 mg by mouth 2 (two) times daily.   Yes [provider]  ondansetron (ZOFRAN) 4 MG tablet  Take 4 mg by mouth every 8 (eight) hours as needed for nausea.   Yes [provider]  PRESCRIPTION MEDICATION Take 1 Bottle by mouth in the morning and at bedtime. Magic cup to prevent malnutricion   Yes [provider]  rosuvastatin (CRESTOR) 20 MG tablet Take 1 tablet (20 mg total) by mouth daily. 02/20/20 03/21/20 Yes Dellia Cloud, MD  senna-docusate (SENOKOT-S) 8.6-50 MG tablet Take 1 tablet by mouth at bedtime as needed for mild constipation. 02/19/20 03/20/20 Yes Dellia Cloud, MD  Sodium Phosphates (RA SALINE ENEMA RE)  Place 1 application rectally daily as needed (extreme constipation). If MOM & bisocydyl supp. Doesn't work   Yes [provider]  tetrahydrozoline 0.05 % ophthalmic solution Place 1 drop into both eyes as needed (allergies).   Yes [provider]  aspirin 81 MG chewable tablet Chew 81 mg by mouth daily.    [provider]    Allergies    Allopurinol, Tetracycline, Amoxicillin-pot clavulanate, Atorvastatin, Azithromycin, Codeine, Lisinopril, and Sulfa antibiotics  Review of Systems   Review of Systems  Gastrointestinal: Positive for vomiting.  All other systems reviewed and are negative.   Physical Exam Updated Vital Signs BP (!) 129/56   Pulse 83   Temp (!) 96.5 F (35.8 C)   Resp 16   Ht 5\' 1"  (1.549 m)   Wt 58.5 kg   SpO2 98%   BMI 24.37 kg/m   Physical Exam Vitals and nursing note reviewed.  Constitutional:      General: She is not in acute distress.    Appearance: She is well-developed.     Comments: Appears dry  HENT:     Head: Normocephalic and atraumatic.  Eyes:     Conjunctiva/sclera: Conjunctivae normal.     Pupils: Pupils are equal, round, and reactive to light.  Cardiovascular:     Rate and Rhythm: Normal rate and regular rhythm.     Heart sounds: Normal heart sounds.  Pulmonary:     Effort: Pulmonary effort is normal. No respiratory distress.     Breath sounds: Normal breath sounds.  Abdominal:     General: There is no distension.     Palpations: Abdomen is soft.     Tenderness: There is no abdominal tenderness.  Musculoskeletal:        General: No deformity. Normal range of motion.     Cervical back: Normal range of motion and neck supple.  Skin:    General: Skin is warm and dry.  Neurological:     General: No focal deficit present.     Mental Status: She is alert.     ED Results / Procedures / Treatments   Labs (all labs ordered are listed, but only abnormal results are displayed) Labs Reviewed  COMPREHENSIVE  METABOLIC PANEL - Abnormal; Notable for the following components:      Result Value   Potassium 6.4 (*)    Chloride 88 (*)    CO2 <7 (*)    Glucose, Bld 184 (*)    BUN 149 (*)    Creatinine, Ser 9.51 (*)    GFR calc non Af Amer 4 (*)    GFR calc Af Amer 4 (*)    All other components within normal limits  LIPASE, BLOOD - Abnormal; Notable for the following components:   Lipase 199 (*)    All other components within normal limits  CBC WITH DIFFERENTIAL/PLATELET - Abnormal; Notable for the following components:   WBC 20.5 (*)  Neutro Abs 16.7 (*)    Monocytes Absolute 1.5 (*)    Abs Immature Granulocytes 0.21 (*)    All other components within normal limits  URINALYSIS, ROUTINE W REFLEX MICROSCOPIC - Abnormal; Notable for the following components:   APPearance HAZY (*)    Hgb urine dipstick SMALL (*)    Protein, ur 100 (*)    Bacteria, UA RARE (*)    All other components within normal limits  LACTIC ACID, PLASMA - Abnormal; Notable for the following components:   Lactic Acid, Venous >11.0 (*)    All other components within normal limits  LACTIC ACID, PLASMA - Abnormal; Notable for the following components:   Lactic Acid, Venous >11.0 (*)    All other components within normal limits  CBG MONITORING, ED - Abnormal; Notable for the following components:   Glucose-Capillary 174 (*)    All other components within normal limits  SARS CORONAVIRUS 2 BY RT PCR (HOSPITAL ORDER, PERFORMED IN [facility])  CULTURE, BLOOD (ROUTINE X 2)  CULTURE, BLOOD (ROUTINE X 2)  TYPE AND SCREEN  ABO/RH    EKG EKG Interpretation  Date/Time:  Monday March 08 2020 08:36:58 EDT Ventricular Rate:  83 PR Interval:    QRS Duration: 89 QT Interval:  414 QTC Calculation: 487 R Axis:   12 Text Interpretation: Sinus rhythm Minimal ST depression, diffuse leads Borderline prolonged QT interval Confirmed by Kristine RoyalMessick, Eeshan Verbrugge [phone]([id number]) on 03/08/2020 8:54:07 AM   Radiology CT ABDOMEN PELVIS WO  CONTRAST  Result Date: 03/08/2020 CLINICAL DATA:  Nausea and vomiting for 2 days. EXAM: CT ABDOMEN AND PELVIS WITHOUT CONTRAST TECHNIQUE: Multidetector CT imaging of the abdomen and pelvis was performed following the standard protocol without IV contrast. COMPARISON:  None. FINDINGS: Lower chest: The lung bases are clear. No infiltrates or effusions. The heart is normal in size. Aortic and coronary artery calcifications are noted. Hepatobiliary: No hepatic lesions are identified without contrast. No intrahepatic biliary dilatation. The gallbladder is distended measuring 5.6 cm in diameter. I do not see any obvious pericholecystic inflammatory changes but recommend correlation with any right upper quadrant abdominal pain. Right upper quadrant ultrasound examination may be helpful for further evaluation if clinically indicated. No common bile duct dilatation. Pancreas: Moderate pancreatic atrophy but no mass, inflammation or ductal dilatation. Spleen: Normal size. No focal lesions. Adrenals/Urinary Tract: The adrenal glands and kidneys are grossly normal. No renal, ureteral or bladder calculi or obvious mass without contrast. There is a Foley catheter noted in the bladder. Stomach/Bowel: The stomach, duodenum, small bowel and colon are grossly normal without oral contrast. No acute inflammatory changes, mass lesions or obstructive findings. The terminal ileum is normal. The appendix is surgically absent. Moderate to advanced sigmoid colon diverticulosis but no findings to suggest acute diverticulitis. Vascular/Lymphatic: Moderate atherosclerotic calcifications involving the aorta and iliac arteries but no aneurysm. No mesenteric or retroperitoneal mass or adenopathy. Reproductive: Surgically absent. Other: No pelvic mass or adenopathy. No free pelvic fluid collections. No inguinal mass or adenopathy. No abdominal wall hernia or subcutaneous lesions. Small inguinal hernias containing fat. Musculoskeletal: No  significant bony findings. Vertebral augmentation changes are noted at L2. No acute bony findings or worrisome bone lesions. IMPRESSION: 1. No acute abdominal/pelvic findings, mass lesions or adenopathy. 2. Distended gallbladder but no obvious pericholecystic inflammatory changes or gallstones. Recommend correlation with any right upper quadrant abdominal pain. Right upper quadrant ultrasound examination may be helpful for further evaluation if clinically indicated. 3. No renal, ureteral or bladder calculi  or obvious mass without contrast. 4. Sigmoid colon diverticulosis without findings for acute diverticulitis. Aortic Atherosclerosis (ICD10-I70.0). Electronically Signed   By: Rudie Meyer M.D.   On: 03/08/2020 10:47    Procedures Procedures (including critical care time)  Medications Ordered in ED Medications  vancomycin (VANCOCIN) IVPB 1000 mg/200 mL premix (has no administration in time range)  ceFEPIme (MAXIPIME) 2 g in sodium chloride 0.9 % 100 mL IVPB (has no administration in time range)  sodium zirconium cyclosilicate (LOKELMA) packet 10 g (has no administration in time range)  sodium chloride 0.9 % bolus 500 mL (0 mLs Intravenous Stopped 03/08/20 1113)  ondansetron (ZOFRAN) injection 4 mg (4 mg Intravenous Given 03/08/20 1006)  sodium chloride 0.9 % bolus 1,000 mL (1,000 mLs Intravenous New Bag/Given 03/08/20 1115)    ED Course  I have reviewed the triage vital signs and the nursing notes.  Pertinent labs & imaging results that were available during my care of the patient were reviewed by me and considered in my medical decision making (see chart for details).    MDM Rules/Calculators/A&P                          MDM  Screen complete  Tasha [name] was evaluated in Emergency Department on 03/08/2020 for the symptoms described in the history of present illness. She was evaluated in the context of the global COVID-19 pandemic, which necessitated consideration that the patient might  be at risk for infection with the SARS-CoV-2 virus that causes COVID-19. Institutional protocols and algorithms that pertain to the evaluation of patients at risk for COVID-19 are in a state of rapid change based on information released by regulatory bodies including the CDC and federal and state organizations. These policies and algorithms were followed during the patient's care in the ED.  Patient is presenting for evaluation of reported nausea and vomiting.  Work-up reveals evidence of acute kidney injury with hyperkalemia.  Patient will require admission for treatment of her suspected dehydration and concurrent AKI.  Dr. Arlean Hopping of nephrology is aware case will consult.  Hospitalist service is aware of case and will evaluate for admission.  Final Clinical Impression(s) / ED Diagnoses Final diagnoses:  Nausea and vomiting, intractability of vomiting not specified, unspecified vomiting type  Hyperkalemia  AKI (acute kidney injury) [facility])    Rx / DC Orders ED Discharge Orders    None       Wynetta Fines, MD 03/08/20 1132

## 2020-03-08 NOTE — Progress Notes (Signed)
Pharmacy Note   A consult was received from an ED physician for vancomycin and cefepime per pharmacy dosing.    The patient's profile has been reviewed for ht/wt/allergies/indication/available labs.    A one time order has been placed for vancomycin 1000 mg IV x 1 and cefepime 2 gr IV x1 .    Further antibiotics/pharmacy consults should be ordered by admitting physician if indicated.                       Thank you,  Adalberto Cole, PharmD, BCPS 03/08/2020 10:48 AM

## 2020-03-08 NOTE — Progress Notes (Signed)
Pharmacy Antibiotic Note  Tasha [name] is a 74 y.o. female admitted on 03/08/2020 with nausea/vomiting; noted to have severe AKI with normal SCr at baseline.  Pharmacy has been consulted for vancomycin and cefepime dosing to r/o sepsis.  Plan:  Patient received vancomycin 1g in ED - no further dosing at this time; will f/u SCr daily and dose by levels as appropriate. If SCr does not improve quickly, would recommend alternative MRSA agent (if still necessary)  Cefepime 1g IV q24 hr based on current SCr; adjust as needed  Daily SCr  Height: 5\' 1"  (154.9 cm) Weight: 58.5 kg (129 lb) IBW/kg (Calculated) : 47.8  Temp (24hrs), Avg:96.7 F (35.9 C), Min:96.1 F (35.6 C), Max:97.9 F (36.6 C)  Recent Labs  Lab 03/08/20 0858 03/08/20 0920  WBC 20.5*  --   CREATININE 9.51*  --   LATICACIDVEN >11.0* >11.0*    Estimated Creatinine Clearance: 4.3 mL/min (A) (by C-G formula based on SCr of 9.51 mg/dL (H)).    Allergies  Allergen Reactions  . Allopurinol Shortness Of Breath  . Tetracycline Anaphylaxis  . Amoxicillin-Pot Clavulanate Diarrhea  . Atorvastatin Other (See Comments)    Unknown reaction - reported by [facility], [facility] clinic 05/10/2012  . Azithromycin Other (See Comments)    Caused blisters inside and out  . Codeine Other (See Comments)    Altered mental status  . Lisinopril Other (See Comments)    Unknown reaction - reported by [facility], [facility] clinic 12/23/2008 02/20/2020 she believes "reaction" was NP cough. No PMH of angioedema  . Sulfa Antibiotics Diarrhea    Antimicrobials this admission: Vancomycin 7/12 >>  Cefepime 7/12 >>   Dose adjustments this admission:  Microbiology results: 7/12 BCx: sent (drawn after initial abx administration)   Thank you for allowing pharmacy to be a part of this patient's care.  Jaycub Noorani A 03/08/2020 12:20 PM

## 2020-03-08 NOTE — ED Triage Notes (Addendum)
[facility] transported pt from [facility] and reports the following:  Nausea and vomiting for 2 days. Brown emesis on gown and towels. Possible constipation.

## 2020-03-08 NOTE — ED Notes (Signed)
Patient transported to CT 

## 2020-03-08 NOTE — H&P (Addendum)
History and Physical    Tasha [name] NUU:[id number] DOB: 03/07/1946 DOA: 03/08/2020  PCP: Patient, No Pcp Per  Patient coming from: home  I have personally briefly reviewed patient's old medical [facility]  Chief Complaint: nausea/vomiting   HPI: Tasha [name] is Tasha [name] 74 y.o. female with medical history significant of stroke, T2DM, HTN, MI who was recently discharged 01/2020 after Tasha [name] stroke workup who represents today with nausea/vomiting.    History is limited due to the patient's mental status.  She says "I don't know" when asked why she is here.  Hx obtained from pt, staff, [facility], and chart.  2 days of nausea and vomiting which has progressively gotten worse.  Staff reports progression to brown emesis.  She denies fevers, chest pain.  Endorses nausea, vomiting, and abdominal pain.  Her son says at baseline, she's alert and oriented.     ED Course: Noted to have significant lactic acidosis and AKI.  IVF, labs, EKG, imaging, antibiotics.  Hospitalist to admit for aki/lactic acidosis and possible sepsis.  Nephrology was c/s by the ED.   Review of Systems: As per HPI otherwise all other systems reviewed and are negative.  Past Medical History:  Diagnosis Date  . Diabetes mellitus without complication (HCC)   . Essential (primary) hypertension   . Essential (primary) hypertension   . Essential (primary) hypertension   . History of falling   . Hypertension   . MI (myocardial infarction) (HCC)   . Stroke Tasha [name](HCC)     Past Surgical History:  Procedure Laterality Date  . ABDOMINAL HYSTERECTOMY    . APPENDECTOMY    . BACK SURGERY      Social History  reports that she has never smoked. She has never used smokeless tobacco. She reports current alcohol use. She reports that she does not use drugs.  Allergies  Allergen Reactions  . Allopurinol Shortness Of Breath  . Tetracycline Anaphylaxis  . Amoxicillin-Pot Clavulanate Diarrhea  . Atorvastatin Other (See Comments)     [facility] - [facility] [place] [facility] 05/10/2012  . Azithromycin Other (See Comments)    Caused blisters inside and out  . Codeine Other (See Comments)    Altered mental status  . Lisinopril Other (See Comments)    Unknown reaction - [name] [place] [facility] 12/23/2008 02/20/2020 she believes "reaction" was NP cough. No PMH of angioedema  . Sulfa Antibiotics Diarrhea    Family History  Adopted: Yes  Family history unknown: Yes   Prior to Admission medications   Medication Sig Start Date End Date Taking? Authorizing Provider  amLODipine (NORVASC) 10 MG tablet Take 1 tablet (10 mg total) by mouth daily. 02/20/20 03/21/20 Yes Dellia Cloudoe, Benjamin, MD  bisacodyl (DULCOLAX) 10 MG suppository Place 10 mg rectally as needed for moderate constipation. If MOM doesn't work   Yes [provider]  clopidogrel (PLAVIX) 75 MG tablet Take 1 tablet (75 mg total) by mouth daily. 02/20/20 03/21/20 Yes Dellia Cloudoe, Benjamin, MD  Dextromethorphan-quiNIDine (NUEDEXTA) 20-10 MG capsule Take 1 capsule by mouth daily. 02/24/20  Yes Medina-Vargas, Monina C, NP  hydrochlorothiazide (HYDRODIURIL) 25 MG tablet Take 1 tablet (25 mg total) by mouth daily. 02/20/20 03/21/20 Yes Dellia Cloudoe, Benjamin, MD  loratadine (CLARITIN) 10 MG tablet Take 10 mg by mouth daily as needed for allergies.   Yes [provider]  magnesium hydroxide (MILK OF MAGNESIA) 400 MG/5ML suspension Take 30 mLs by mouth daily as needed for mild constipation. If no BM in 3 days  Yes [provider]  metFORMIN (GLUCOPHAGE) 1000 MG tablet Take 1 tablet (1,000 mg total) by mouth daily with breakfast. 02/19/20 03/20/20 Yes Dellia Cloud, MD  metoprolol tartrate (LOPRESSOR) 25 MG tablet Take 25 mg by mouth 2 (two) times daily.   Yes [provider]  ondansetron (ZOFRAN) 4 MG tablet Take 4 mg by mouth every 8 (eight) hours as needed for nausea.   Yes [provider]  PRESCRIPTION MEDICATION Take 1 Bottle by mouth in the  morning and at bedtime. Magic cup to prevent malnutricion   Yes [provider]  rosuvastatin (CRESTOR) 20 MG tablet Take 1 tablet (20 mg total) by mouth daily. 02/20/20 03/21/20 Yes Dellia Cloud, MD  senna-docusate (SENOKOT-S) 8.6-50 MG tablet Take 1 tablet by mouth at bedtime as needed for mild constipation. 02/19/20 03/20/20 Yes Dellia Cloud, MD  Sodium Phosphates (RA SALINE ENEMA RE) Place 1 application rectally daily as needed (extreme constipation). If MOM & bisocydyl supp. Doesn't work   Yes [provider]  tetrahydrozoline 0.05 % ophthalmic solution Place 1 drop into both eyes as needed (allergies).   Yes [provider]  aspirin 81 MG chewable tablet Chew 81 mg by mouth daily.    [provider]    Physical Exam: Vitals:   03/08/20 1230 03/08/20 1245 03/08/20 1300 03/08/20 1305  BP: (!) 129/54 (!) 112/59 131/60   Pulse: 90 85 84 90  Resp: Temp: (!) 96 F (35.6 C) (!) 95.9 F (35.5 C) (!) 95.8 F (35.4 C) (!) 95.7 F (35.4 C)  TempSrc:      SpO2: 100% 100% 100% 100%  Weight:      Height:        Constitutional: NAD, calm, comfortable Vitals:   03/08/20 1230 03/08/20 1245 03/08/20 1300 03/08/20 1305  BP: (!) 129/54 (!) 112/59 131/60   Pulse: 90 85 84 90  Resp: Temp: (!) 96 F (35.6 C) (!) 95.9 F (35.5 C) (!) 95.8 F (35.4 C) (!) 95.7 F (35.4 C)  TempSrc:      SpO2: 100% 100% 100% 100%  Weight:      Height:       Eyes: PERRL, lids and conjunctivae normal ENMT: Mucous membranes are moist. Posterior pharynx clear of any exudate or lesions.Normal dentition.  Neck: normal, supple, no masses, no thyromegaly Respiratory: clear to auscultation bilaterally, no wheezing, no crackles. Normal respiratory effort. No accessory muscle use.  Cardiovascular: Regular rate and rhythm, no murmurs / rubs / gallops. No extremity edema Abdomen: no tenderness, no masses palpated. No hepatosplenomegaly. Musculoskeletal: no  clubbing / cyanosis. No joint deformity upper and lower extremities. Good ROM, no contractures. Normal muscle tone.  Skin: no rashes, lesions, ulcers. No induration Neurologic: Right sided facial droop.  R. Sensation intact.  R sided weakness, but pt not following commands consistently, so difficult exam.  Psychiatric: Confused, impaired judgment and insight. Alert and oriented x 2. Normal mood.   Labs on Admission: I have personally reviewed following labs and imaging studies  CBC: Recent Labs  Lab 03/08/20 0858  WBC 20.5*  NEUTROABS 16.7*  HGB 13.9  HCT 43.8  MCV 92.4  PLT 316    Basic Metabolic Panel: Recent Labs  Lab 03/08/20 0858  NA 139  K 6.4*  CL 88*  CO2 <7*  GLUCOSE 184*  BUN 149*  CREATININE 9.51*  CALCIUM 8.9    GFR: Estimated Creatinine Clearance: 4.3 mL/min (Ikeya Brockel) (by C-G  formula based on SCr of 9.51 mg/dL (H)).  Liver Function Tests: Recent Labs  Lab 03/08/20 0858  AST 27  ALT 20  ALKPHOS 60  BILITOT 0.8  PROT 7.7  ALBUMIN 4.2    Urine analysis:    Component Value Date/Time   COLORURINE YELLOW 03/08/2020 1020   APPEARANCEUR HAZY (Xareni Kelch) 03/08/2020 1020   LABSPEC 1.015 03/08/2020 1020   PHURINE 5.0 03/08/2020 1020   GLUCOSEU NEGATIVE 03/08/2020 1020   HGBUR SMALL (Lucky Trotta) 03/08/2020 1020   BILIRUBINUR NEGATIVE 03/08/2020 1020   KETONESUR NEGATIVE 03/08/2020 1020   PROTEINUR 100 (Vickey Boak) 03/08/2020 1020   NITRITE NEGATIVE 03/08/2020 1020   LEUKOCYTESUR NEGATIVE 03/08/2020 1020    Radiological Exams on Admission: CT ABDOMEN PELVIS WO CONTRAST  Result Date: 03/08/2020 CLINICAL DATA:  Nausea and vomiting for 2 days. EXAM: CT ABDOMEN AND PELVIS WITHOUT CONTRAST TECHNIQUE: Multidetector CT imaging of the abdomen and pelvis was performed following the standard protocol without IV contrast. COMPARISON:  None. FINDINGS: Lower chest: The lung bases are clear. No infiltrates or effusions. The heart is normal in size. Aortic and coronary artery calcifications are  noted. Hepatobiliary: No hepatic lesions are identified without contrast. No intrahepatic biliary dilatation. The gallbladder is distended measuring 5.6 cm in diameter. I do not see any obvious pericholecystic inflammatory changes but recommend correlation with any right upper quadrant abdominal pain. Right upper quadrant ultrasound examination may be helpful for further evaluation if clinically indicated. No common bile duct dilatation. Pancreas: Moderate pancreatic atrophy but no mass, inflammation or ductal dilatation. Spleen: Normal size. No focal lesions. Adrenals/Urinary Tract: The adrenal glands and kidneys are grossly normal. No renal, ureteral or bladder calculi or obvious mass without contrast. There is Ettamae Barkett Foley catheter noted in the bladder. Stomach/Bowel: The stomach, duodenum, small bowel and colon are grossly normal without oral contrast. No acute inflammatory changes, mass lesions or obstructive findings. The terminal ileum is normal. The appendix is surgically absent. Moderate to advanced sigmoid colon diverticulosis but no findings to suggest acute diverticulitis. Vascular/Lymphatic: Moderate atherosclerotic calcifications involving the aorta and iliac arteries but no aneurysm. No mesenteric or retroperitoneal mass or adenopathy. Reproductive: Surgically absent. Other: No pelvic mass or adenopathy. No free pelvic fluid collections. No inguinal mass or adenopathy. No abdominal wall hernia or subcutaneous lesions. Small inguinal hernias containing fat. Musculoskeletal: No significant bony findings. Vertebral augmentation changes are noted at L2. No acute bony findings or worrisome bone lesions. IMPRESSION: 1. No acute abdominal/pelvic findings, mass lesions or adenopathy. 2. Distended gallbladder but no obvious pericholecystic inflammatory changes or gallstones. Recommend correlation with any right upper quadrant abdominal pain. Right upper quadrant ultrasound examination may be helpful for further  evaluation if clinically indicated. 3. No renal, ureteral or bladder calculi or obvious mass without contrast. 4. Sigmoid colon diverticulosis without findings for acute diverticulitis. Aortic Atherosclerosis (ICD10-I70.0). Electronically Signed   By: Rudie Meyer M.D.   On: 03/08/2020 10:47   CT HEAD WO CONTRAST  Result Date: 03/08/2020 CLINICAL DATA:  Encephalopathy, nausea, vomiting EXAM: CT HEAD WITHOUT CONTRAST TECHNIQUE: Contiguous axial images were obtained from the base of the skull through the vertex without intravenous contrast. COMPARISON:  02/15/2020 CT and MRI FINDINGS: Brain: There is atrophy and chronic small vessel disease changes. Previously seen acute left internal capsule infarct now visualized by CT. Previously described left parietal infarct not well visualized by CT. No new areas of infarction. No hemorrhage or hydrocephalus. Vascular: No hyperdense vessel or unexpected calcification. Skull: No acute calvarial abnormality. Sinuses/Orbits:  Visualized paranasal sinuses and mastoids clear. Orbital soft tissues unremarkable. Other: None IMPRESSION: Left posterior internal capsule lacunar infarct as seen on prior MRI is now seen by CT. No new areas of infarction. Atrophy, chronic small vessel disease. Electronically Signed   By: Charlett Nose M.D.   On: 03/08/2020 12:30   DG CHEST PORT 1 VIEW  Result Date: 03/08/2020 CLINICAL DATA:  Nausea and vomiting EXAM: PORTABLE CHEST 1 VIEW COMPARISON:  None. FINDINGS: The heart size and mediastinal contours are within normal limits. Both lungs are clear. No pleural effusion. The visualized skeletal structures are unremarkable. IMPRESSION: No acute process in the chest. Electronically Signed   By: Guadlupe Spanish M.D.   On: 03/08/2020 11:41   US Abdomen Limited RUQ  Result Date: 03/08/2020 CLINICAL DATA:  Elevated lipase. EXAM: ULTRASOUND ABDOMEN LIMITED RIGHT UPPER QUADRANT COMPARISON:  CT abdomen pelvis from same day. FINDINGS: Gallbladder:  Distended with small amount of sludge. No gallstones or wall thickening visualized. No sonographic Murphy sign noted by sonographer. Common bile duct: Diameter: 5 mm, normal. Liver: No focal lesion identified. Diffusely increased in parenchymal echogenicity. Portal vein is patent on color Doppler imaging with normal direction of blood flow towards the liver. Other: None. IMPRESSION: 1. Distended gallbladder with small amount of sludge. No sonographic evidence of acute cholecystitis. 2. Hepatic steatosis. Electronically Signed   By: Obie Dredge M.D.   On: 03/08/2020 12:58    EKG: Independently reviewed. Sinus rhythm, compared to priors  Assessment/Plan Active Problems:   Lactic acidosis  Anion Gap Metabolic Acidosis  Lactic Acidosis: VBG with metabolic acidosis, also with respiratory alkalosis (suspect compensation).  Ddx for AGMA/lactic acidosis includes sepsis, hypovolemia, lactic acidosis related to metformin use, acute kidney injury, etc. Appreciate renal assistance  Continue to trend lactate IVF, will continue bicarbonate infusion   Acute Kidney Injury: suspect related to hypovolemia/ATN in setting of nausea/vomiting.  Pt also on thiazide.  Creatinine 9.51 on presentation, previously <0.1.  UA with protein, 0-5 RBC's No hydro noted on CT Follow with IVF (bicarb) Foley for I/O's Renal consult, appreciate recs  Hyperkalemia: 2/2 above/acidemia - follow with lokelma/bicarb infusion.  Leukocytosis  Sepsis  Hypothermia: treating for presumed sepsis with leukocytosis, lactic acidosis, hypothermia.   S/p 30 cc/kg IVF -> continue aggressive IVF Broad spectrum abx, vanc/cefepime - narrow as indicated Follow blood cx, urinalysis not suggestive of UTI  CXR without acute findings, abdominal CT without infectious process - RUQ [place] without evidence of cholecystitis  Nausea  Vomiting  addendum ? Coffee ground emesis: unclear etiology, CT findings don't explain these symptoms.  No recurrent  emesis since being admitted.  Elevated lipase, but no sig abdominal pain, imaging does not support pancreatitis.   Repeat lipase in AM Addendum: 1949 pt noted to have "coffee ground emesis".  Dark brown stool noted as well.  Stop plavix and prophylactic heparin.  Start PPI gtt.  She's been hemodynamically stable with normal BP - hemoglobin is relatively stable.  Please call GI in the AM. Hemoccult.     Acute Metabolic Encephalopathy: suspect 2/2 above.  Head CT with left posterior internal capsule lacunar infarct, but no new areas of infarction.   Delirium precautions TSH, b12, folate SLP eval   T2DM: holding/stop metformin.  SSI q4.    History of CVA: recent stroke, discharged on 6/24 - was to continue DAPT x3 weeks, then plavix alone.  At that time, needed 3 month f/u MRI and 6 week f/u with neuro.  Persistent R sided  weakness on exam today, but did not clearly follow commands.  Continue plavix, holding crestor for now  Hypertension: started on amlodipine and HCTZ during last hospitalization. Continue metoprolol.  Follow BP while holding HCTZ and amlodipine with concern for sepsis/aki  DVT prophylaxis: heparin Code Status:   full  Family Communication:  Son over phone Disposition Plan:   Patient is from:  [place]  Anticipated DC to:  pending  Anticipated DC date:  pending  Anticipated DC barriers: Improved renal function and acidemia  Consults called:  Renal  Admission status:  inpatient  Severity of Illness: The appropriate patient status for this patient is INPATIENT. Inpatient status is judged to be reasonable and necessary in order to provide the required intensity of service to ensure the patient's safety. The patient's presenting symptoms, physical exam findings, and initial radiographic and laboratory data in the context of their chronic comorbidities is felt to place them at high risk for further clinical deterioration. Furthermore, it is not anticipated that the patient will  be medically stable for discharge from the [name] within 2 midnights of admission. The following factors support the patient status of inpatient.   " The patient's presenting symptoms include lactic acidosis, acidemia. " The worrisome physical exam findings include hypothermia, altered mental status. " The initial radiographic and laboratory data are worrisome because of elevated lactate, acidemia. " The chronic co-morbidities include hx stroke, diabetes.   * I certify that at the point of admission it is my clinical judgment that the patient will require inpatient [name] care spanning beyond 2 midnights from the point of admission due to high intensity of service, high risk for further deterioration and high frequency of surveillance required.*  50 min critical care time with lactic acidosis, AGMA, aki, sepsis    Lacretia Nicks MD [facility]  How to contact the [facility] Attending or Consulting provider 7A - 7P or covering provider during after hours 7P -7A, for this patient?   1. Check the care team in [name] and look for Kreig Parson) attending/consulting [facility] provider listed and b) the [facility] team listed 2. Log into [email or web address] and use [place]'s universal password to access. If you do not have the password, please contact the [name] operator. 3. Locate the [facility] provider you are looking for under [facility] and page to Hollye Pritt number that you can be directly reached. 4. If you still have difficulty reaching the provider, please page the [facility] (Director on Call) for the Hospitalists listed on amion for assistance.  03/08/2020, 1:27 PM

## 2020-03-08 NOTE — ED Notes (Signed)
ED Provider at bedside. 

## 2020-03-08 NOTE — Consult Note (Addendum)
Renal Service Consult Note [facility]  Tasha [name] 03/08/2020 Maree Krabbe Requesting Physician:  Dr Lowell Guitar, C.   Reason for Consult:  Renal failure HPI: The patient is a 74 y.o. year-old w/ hx of HTN, DM2, MI, CVA presented to ED this am from [facility] for nausea and vomiting x several days. In ED labs showed creat 9.51, creat was normal in June 2021 at 0.75. We are asked to see for renal failure.    Pt was hypothermic but BP's are normal and HR's are 80-90's.  WBC 25k.  LA was wnl. SpO2 is good at 100% on RA.  CXR negative.   Pt seen in ICU, no hx kidney problems. Very poor historian, appears confused or maybe just lethargic.   ROS  denies CP  no joint pain   no HA  no blurry vision  no rash  no diarrhea   Past Medical History  Past Medical History:  Diagnosis Date  . Diabetes mellitus without complication (HCC)   . Essential (primary) hypertension   . Essential (primary) hypertension   . Essential (primary) hypertension   . History of falling   . Hypertension   . MI (myocardial infarction) (HCC)   . Stroke [facility])    Past Surgical History  Past Surgical History:  Procedure Laterality Date  . ABDOMINAL HYSTERECTOMY    . APPENDECTOMY    . BACK SURGERY     Family History  Family History  Adopted: Yes  Family history unknown: Yes   Social History  reports that she has never smoked. She has never used smokeless tobacco. She reports current alcohol use. She reports that she does not use drugs. Allergies  Allergies  Allergen Reactions  . Allopurinol Shortness Of Breath  . Tetracycline Anaphylaxis  . Amoxicillin-Pot Clavulanate Diarrhea  . Atorvastatin Other (See Comments)    Unknown reaction - reported by [facility] [place] [facility] 05/10/2012  . Azithromycin Other (See Comments)    Caused blisters inside and out  . Codeine Other (See Comments)    Altered mental status  . Lisinopril Other (See Comments)    Unknown reaction - reported by  [facility] [place] clinic 12/23/2008 02/20/2020 she believes "reaction" was NP cough. No PMH of angioedema  . Sulfa Antibiotics Diarrhea   Home medications Prior to Admission medications   Medication Sig Start Date End Date Taking? Authorizing Provider  amLODipine (NORVASC) 10 MG tablet Take 1 tablet (10 mg total) by mouth daily. 02/20/20 03/21/20 Yes Dellia Cloud, MD  bisacodyl (DULCOLAX) 10 MG suppository Place 10 mg rectally as needed for moderate constipation. If MOM doesn't work   Yes [provider]  clopidogrel (PLAVIX) 75 MG tablet Take 1 tablet (75 mg total) by mouth daily. 02/20/20 03/21/20 Yes Dellia Cloud, MD  Dextromethorphan-quiNIDine (NUEDEXTA) 20-10 MG capsule Take 1 capsule by mouth daily. 02/24/20  Yes Medina-Vargas, Monina C, NP  hydrochlorothiazide (HYDRODIURIL) 25 MG tablet Take 1 tablet (25 mg total) by mouth daily. 02/20/20 03/21/20 Yes Dellia Cloud, MD  loratadine (CLARITIN) 10 MG tablet Take 10 mg by mouth daily as needed for allergies.   Yes [provider]  magnesium hydroxide (MILK OF MAGNESIA) 400 MG/5ML suspension Take 30 mLs by mouth daily as needed for mild constipation. If no BM in 3 days   Yes [provider]  metFORMIN (GLUCOPHAGE) 1000 MG tablet Take 1 tablet (1,000 mg total) by mouth daily with breakfast. 02/19/20 03/20/20 Yes Dellia Cloud, MD  metoprolol tartrate (LOPRESSOR) 25 MG tablet Take  25 mg by mouth 2 (two) times daily.   Yes [provider]  ondansetron (ZOFRAN) 4 MG tablet Take 4 mg by mouth every 8 (eight) hours as needed for nausea.   Yes [provider]  PRESCRIPTION MEDICATION Take 1 Bottle by mouth in the morning and at bedtime. Magic cup to prevent malnutricion   Yes [provider]  rosuvastatin (CRESTOR) 20 MG tablet Take 1 tablet (20 mg total) by mouth daily. 02/20/20 03/21/20 Yes Dellia Cloud, MD  senna-docusate (SENOKOT-S) 8.6-50 MG tablet Take 1 tablet by mouth at bedtime as needed for mild  constipation. 02/19/20 03/20/20 Yes Dellia Cloud, MD  Sodium Phosphates (RA SALINE ENEMA RE) Place 1 application rectally daily as needed (extreme constipation). If MOM & bisocydyl supp. Doesn't work   Yes [provider]  tetrahydrozoline 0.05 % ophthalmic solution Place 1 drop into both eyes as needed (allergies).   Yes [provider]  aspirin 81 MG chewable tablet Chew 81 mg by mouth daily.    [provider]     Vitals:   03/08/20 1245 03/08/20 1300 03/08/20 1305 03/08/20 1315  BP: (!) 112/59 131/60  111/64  Pulse: 85 84 90 88  Resp: 15 15 17 20   Temp: (!) 95.9 F (35.5 C) (!) 95.8 F (35.4 C) (!) 95.7 F (35.4 C) (!) 95.5 F (35.3 C)  TempSrc:    Bladder  SpO2: 100% 100% 100% 100%  Weight:    59.6 kg  Height:    5\' 4"  (1.626 m)   Exam Gen elderly WF, not in distress Very dry mouth No rash, cyanosis or gangrene Sclera anicteric  No jvd or bruits Chest clear bilat to bases no rales or wheezing RRR no MRG  Abd soft ntnd no mass or ascites +bs GU foley cath draining small amts of light yellow urine MS no joint effusions or deformity Ext no LE or UE edema, no wounds or ulcers Neuro is lethargic, arouses easily, follows simple commands    Home meds:  - norvasc 10/ plavix 75/ HCTZ 25 / metoprolol 25 bid/ crestor 25 qd/ asa 81  - metformin 1gm qam  - prn's/ vitamins/ supplements   UA 0-5 rbc/ wbc, rare bact, 100 prot, small Hb, hazy CXR - no acute disease\  CT abd/ pelv wo contrast > Adrenals/Urinary Tract: The adrenal glands and kidneys are grossly normal. No renal, ureteral or bladder calculi or obvious mass without contrast. There is a Foley catheter noted in the bladder. IMPRESSION: 1. No acute abdominal/pelvic findings, mass lesions or adenopathy. 2. Distended gallbladder but no obvious pericholecystic inflammatory changes or gallstones. Recommend correlation with any right upper quadrant abdominal pain. Right upper quadrant ultrasound  examination may be helpful for further evaluation if clinically indicated. 3. No renal, ureteral or bladder calculi or obvious mass without contrast. 4. Sigmoid colon diverticulosis without findings for acute diverticulitis.      Na 140 K 5.7  CO2 7 AG >20  BUN 149  Creat 9.33   LA > 11.0   WBC 22K Hb 13  plt 394  Assessment/ Plan: 1. Acute renal failure - normal creat in June 2021. AK of unclear cause, suspect pre-renal given hx N/V, may have septic picture as well w/ low temp /  ^WBC, however is not in shock.  Would like to try IVF's to see if renal function will pick up. No acei/ARB/ nsaids noted at home. Kidneys look normal on CT abd.  UA negative. Agree w/ IV bicarb  gtt at 150 / hr for now. Get urine lytes and RUS. Will follow. If deteriorates at all will need CRRT but would not start just yet.  2. Reina Fuse - r/o sepsis. UA and CXR unremarkable.  GB distended on CT but no wall thickening or stones noted. Per primary team.  3. HTN - hold home BP meds , have dc'd metoprolol for now 4. DM2 - on oral agents at home.  Per pmd.  5. H/o CVA - details unknown      Vinson Moselle  MD 03/08/2020, 3:09 PM  Recent Labs  Lab 03/08/20 0858 03/08/20 1401  WBC 20.5* 22.3*  HGB 13.9 13.4   Recent Labs  Lab 03/08/20 0858 03/08/20 1401  K 6.4* 5.7*  BUN 149* PENDING  CREATININE 9.51* 9.33*  CALCIUM 8.9 8.0*

## 2020-03-08 NOTE — Progress Notes (Signed)
During shift change, both RN's entered the room and noticed the patient had brown "coffee-ground" vomit on her blankets and coming from her right nare.  The substance had a foul odor similar to a GI bleed.

## 2020-03-08 NOTE — ED Notes (Signed)
Pure wick has been placed. Suction set to 45mmHg.  
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
## 2020-03-08 NOTE — ED Notes (Signed)
Unable to obtain blood cultures prior to starting the antibiotics.

## 2020-03-09 ENCOUNTER — Inpatient Hospital Stay (HOSPITAL_COMMUNITY): Payer: Medicare Other

## 2020-03-09 DIAGNOSIS — K920 Hematemesis: Secondary | ICD-10-CM

## 2020-03-09 DIAGNOSIS — E1151 Type 2 diabetes mellitus with diabetic peripheral angiopathy without gangrene: Secondary | ICD-10-CM

## 2020-03-09 DIAGNOSIS — I10 Essential (primary) hypertension: Secondary | ICD-10-CM

## 2020-03-09 DIAGNOSIS — R6520 Severe sepsis without septic shock: Secondary | ICD-10-CM

## 2020-03-09 DIAGNOSIS — A419 Sepsis, unspecified organism: Secondary | ICD-10-CM

## 2020-03-09 DIAGNOSIS — E875 Hyperkalemia: Secondary | ICD-10-CM

## 2020-03-09 LAB — GLUCOSE, CAPILLARY
Glucose-Capillary: 100 mg/dL — ABNORMAL HIGH (ref 70–99)
Glucose-Capillary: 119 mg/dL — ABNORMAL HIGH (ref 70–99)
Glucose-Capillary: 129 mg/dL — ABNORMAL HIGH (ref 70–99)
Glucose-Capillary: 128 mg/dL — ABNORMAL HIGH (ref 70–99)
Glucose-Capillary: 130 mg/dL — ABNORMAL HIGH (ref 70–99)

## 2020-03-09 LAB — COMPREHENSIVE METABOLIC PANEL
ALT: 18 U/L (ref 0–44)
AST: 17 U/L (ref 15–41)
Albumin: 3.2 g/dL — ABNORMAL LOW (ref 3.5–5.0)
Alkaline Phosphatase: 47 U/L (ref 38–126)
Anion gap: 27 — ABNORMAL HIGH (ref 5–15)
BUN: 122 mg/dL — ABNORMAL HIGH (ref 8–23)
CO2: 21 mmol/L — ABNORMAL LOW (ref 22–32)
Calcium: 7.2 mg/dL — ABNORMAL LOW (ref 8.9–10.3)
Chloride: 91 mmol/L — ABNORMAL LOW (ref 98–111)
Creatinine, Ser: 8.37 mg/dL — ABNORMAL HIGH (ref 0.44–1.00)
GFR calc Af Amer: 5 mL/min — ABNORMAL LOW (ref >60)
GFR calc non Af Amer: 4 mL/min — ABNORMAL LOW (ref >60)
Glucose, Bld: 97 mg/dL (ref 70–99)
Potassium: 3.7 mmol/L (ref 3.5–5.1)
Sodium: 139 mmol/L (ref 135–145)
Total Bilirubin: 0.8 mg/dL (ref 0.3–1.2)
Total Protein: 5.7 g/dL — ABNORMAL LOW (ref 6.5–8.1)

## 2020-03-09 LAB — BLOOD GAS, VENOUS
Acid-base deficit: 4.5 mmol/L — ABNORMAL HIGH (ref 0.0–2.0)
Bicarbonate: 19.5 mmol/L — ABNORMAL LOW (ref 20.0–28.0)
O2 Saturation: 92.3 %
Patient temperature: 98.6
pCO2, Ven: 34.1 mmHg — ABNORMAL LOW (ref 44.0–60.0)
pH, Ven: 7.376 (ref 7.250–7.430)
pO2, Ven: 68.6 mmHg — ABNORMAL HIGH (ref 32.0–45.0)

## 2020-03-09 LAB — CBC
HCT: 32.3 % — ABNORMAL LOW (ref 36.0–46.0)
Hemoglobin: 11.2 g/dL — ABNORMAL LOW (ref 12.0–15.0)
MCH: 29.2 pg (ref 26.0–34.0)
MCHC: 34.7 g/dL (ref 30.0–36.0)
MCV: 84.1 fL (ref 80.0–100.0)
Platelets: 271 10*3/uL (ref 150–400)
RBC: 3.84 MIL/uL — ABNORMAL LOW (ref 3.87–5.11)
RDW: 14.1 % (ref 11.5–15.5)
WBC: 17.1 10*3/uL — ABNORMAL HIGH (ref 4.0–10.5)
nRBC: 0 % (ref 0.0–0.2)

## 2020-03-09 LAB — OCCULT BLOOD X 1 CARD TO LAB, STOOL: Fecal Occult Bld: NEGATIVE

## 2020-03-09 LAB — LACTIC ACID, PLASMA: Lactic Acid, Venous: 3.8 mmol/L (ref 0.5–1.9)

## 2020-03-09 LAB — LIPASE, BLOOD: Lipase: 307 U/L — ABNORMAL HIGH (ref 11–51)

## 2020-03-09 LAB — TSH: TSH: 2.232 u[IU]/mL (ref 0.350–4.500)

## 2020-03-09 IMAGING — DX DG CHEST 1V PORT
1 series · 1 of 1 positions shown · non-contrast
Comparison: [DATE]

CLINICAL DATA: Sepsis

EXAM:
PORTABLE CHEST 1 VIEW

[chest ap]
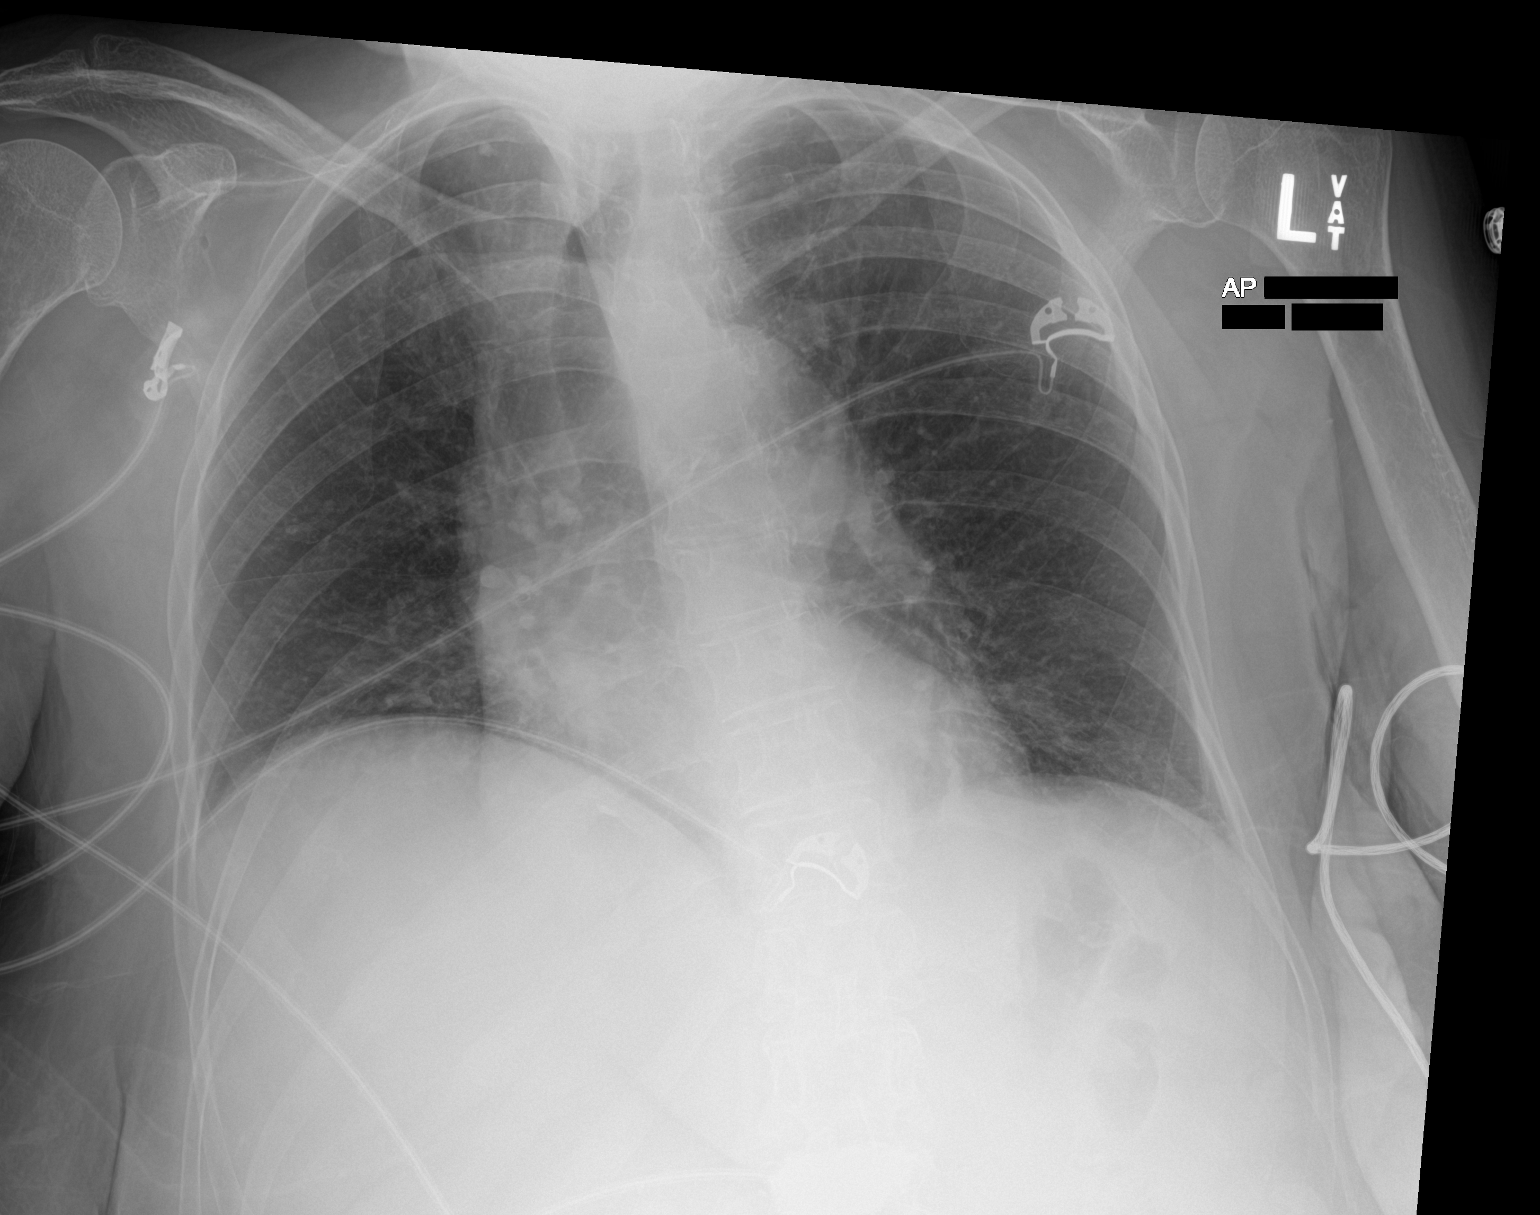

[1 of 1 positions shown; findings below may reference images not displayed]

FINDINGS: Heart is normal size. Minimal bibasilar opacities, likely
atelectasis. No effusions or acute bony abnormality.
IMPRESSION: Bibasilar atelectasis

## 2020-03-09 MED ORDER — RESOURCE THICKENUP CLEAR PO POWD
ORAL | Status: DC | PRN
  Filled 2020-03-09: qty 125

## 2020-03-09 MED ORDER — INSULIN ASPART 100 UNIT/ML ~~LOC~~ SOLN
0.0000 [IU] | Freq: Three times a day (TID) | SUBCUTANEOUS | Status: DC
  Administered 2020-03-10 – 2020-03-12 (×6): 1 [IU] via SUBCUTANEOUS

## 2020-03-09 MED ORDER — STERILE WATER FOR INJECTION IV SOLN
INTRAVENOUS | Status: DC
  Filled 2020-03-09 (×2): qty 150
  Filled 2020-03-09: qty 850

## 2020-03-09 MED ORDER — SODIUM CHLORIDE 0.9 % IV SOLN
INTRAVENOUS | Status: DC | PRN
  Administered 2020-03-09 – 2020-03-14 (×4): 250 mL via INTRAVENOUS

## 2020-03-09 NOTE — Progress Notes (Signed)
PROGRESS NOTE    Tasha [name]  ZOX:[id number] DOB: [birth year] DOA: 03/08/2020 PCP: Patient, No Pcp Per   Chief Complaint  Patient presents with  . Emesis    Brief Narrative: HPI per Dr. Geanie Logan is a 74 y.o. female with medical history significant of stroke, T2DM, HTN, MI who was recently discharged 01/2020 after a stroke workup who represented on day of admission with nausea/vomiting.    History was limited due to the patient's mental status.  She says "I don't know" when asked why she is here.  Hx obtained from pt, staff, [facility], and chart.  2 days of nausea and vomiting which has progressively gotten worse.  Staff reports progression to brown emesis.  She denies fevers, chest pain.  Endorses nausea, vomiting, and abdominal pain.  Her son says at baseline, she's alert and oriented.     ED Course: Noted to have significant lactic acidosis and AKI.  IVF, labs, EKG, imaging, antibiotics.  Hospitalist to admit for aki/lactic acidosis and possible sepsis.  Nephrology was c/s by the ED.   Patient admitted, placed empirically on IV antibiotics, pancultured, placed on aggressive IV fluid resuscitation with bicarb.  Nephrology consulted.  The evening of admission 03/08/2020 patient noted to have some coffee-ground emesis.  Hemoccult negative.  Patient placed on a Protonix drip.   Assessment & Plan:   Active Problems:   CVA (cerebral vascular accident) (HCC)   DM (diabetes mellitus), type 2 with peripheral vascular complications (HCC)   Essential (primary) hypertension   Lactic acidosis   Nausea and vomiting   AKI (acute kidney injury) (HCC)  #1 concern for sepsis/hypothermia Patient admitted with ongoing nausea and vomiting with concerns for sepsis as patient noted to be hypothermic, with a leukocytosis, acute renal failure.  Evaded lactic acid level which is trending down.  Leukocytosis trending down patient pancultured.  Chest x-ray negative for any acute findings.   Urinalysis not suggestive of UTI.  CT abdomen and pelvis negative for any acute infectious process.  Right upper quadrant ultrasound negative for any evidence of cholecystitis.  Lipase elevated but patient asymptomatic.  Patient placed on aggressive fluid resuscitation of sodium bicarb.  Patient also placed empirically on IV vancomycin IV cefepime.  Patient improving slowly.  Follow.  2.  Acute renal failure Questionable etiology.  Likely secondary to a prerenal azotemia secondary to ongoing nausea and vomiting in addition to concern for sepsis with hypothermia, leukocytosis.  CT abdomen and pelvis with no hydronephrosis noted.  Renal ultrasound with no hydronephrosis or shadowing stone.   Urine output of 954 cc over the past 24 hours.  Creatinine trending down currently at 8.37 from 9.33 on admission.  Patient placed aggressively on IV fluids.  Avoid nephrotoxins.  Nephrology consulted and are following.  3.  Acute metabolic encephalopathy Likely secondary to problems #1 and 2.  CT head with left posterior internal capsule lacunar infarct, no new acute infarcts noted.  TSH within normal limits.  Folate level at 6.6.  Vitamin B12 at 390.  Patient slowly improving clinically.  Continue IV fluids, empiric IV antibiotics.  Follow.  4.  Nausea/vomiting/coffee-ground emesis Patient had presented with ongoing nausea and vomiting.  CT abdomen and pelvis with no acute abnormalities noted.  Patient noted to have coffee-ground emesis yesterday evening around 1949 hrs.  Likely secondary to a Mallory-Weiss tear.  No further bouts of nausea or emesis.  Patient on a Protonix drip which we will continue for now.  FOBT was negative.  Hemoglobin  currently at 11.2 from 13.4 on admission.  Drop in hemoglobin likely dilutional component.  Repeat H&H this afternoon.  Lipase levels elevated however patient with no abdominal pain.  Continue supportive care with IV fluids.  We will hold off on GI consultation for now unless  patient has any further coffee-ground emesis with a significant drop in hemoglobin.  Continue to hold aspirin and Plavix for now.  5.  Anion gap metabolic acidosis/lactic acidosis VBG consistent with a respiratory alkalosis and metabolic acidosis.  Likely secondary to acute sepsis in the setting of hypovolemia, acute renal failure in the setting of Metformin use.  Anion gap improving as well as metabolic acidosis on bicarb drip.  Lactic acid level trending down.  Patient pancultured.  Continue empiric IV antibiotics.  Supportive care.  Follow.  6.  Hyperkalemia Likely secondary to acute renal failure.  Potassium at 3.7.  Discontinue Lokelma.  Follow.  Per nephrology.  7.  History of CVA Patient with recent stroke discharged on 02/19/2020 was supposed to be on dual antiplatelet therapy x3 weeks then Plavix alone.  Patient noted to need a repeat MRI in 3 months as well as 6-week follow-up with neurology.  Patient does have a chronic persistent right-sided weakness.  Mental status slowly improving.  Aspirin and Plavix on hold secondary to coffee-ground emesis.  Continue to hold statin.  If patient with no further coffee-ground emesis, and hemoglobin remained stable may consider resuming aspirin and Plavix in the next 1 to 2 days.  Statin on hold.  Follow.  PT/OT.  8.  Type 2 diabetes mellitus Hemoglobin A1c 6.9 (02/15/2020).  CBG of 119.  Change CBGs to [facility] at bedtime.  Sliding scale insulin.  9.  Hypertension Norvasc and HCTZ on hold due to concern for sepsis and acute kidney injury.  Metoprolol discontinued per nephrology.  Blood pressure stable.  Follow.   DVT prophylaxis: SCD Code Status: Full Family Communication: Updated patient.  No family at bedside. Disposition:   Status is: Inpatient    Dispo: The patient is from: Home              Anticipated d/c is to: To be determined              Anticipated d/c date is: 4-5 days.              Patient currently septic, on IV antibiotics, acute  metabolic encephalopathy slowly improving, acute renal failure.       Consultants:   Nephrology: Dr.Schertz 03/08/2020  Procedures:   CT head 03/08/2020  CT abdomen and pelvis 03/08/2020  Chest x-ray 03/08/2020, 03/09/2020  Renal ultrasound 03/08/2020  Right upper quadrant ultrasound 03/08/2020  Antimicrobials:   IV vancomycin 03/08/2020>>>  IV cefepime 03/08/2020>>>>   Subjective: Events yesterday evening noted.  Patient per RN had some coffee-ground emesis around shift change.  Patient with no further coffee-ground emesis per RN.  Patient awake, alert, following commands appropriately.  Denies any chest pain.  Denies any shortness of breath.  Objective: Vitals:   03/09/20 0400 03/09/20 0409 03/09/20 0500 03/09/20 0600  BP: (!) 136/53  (!) 143/45 (!) 143/61  Pulse: 76  78 86  Resp: 12  11 12   Temp: 98.4 F (36.9 C)  98.4 F (36.9 C) 98.4 F (36.9 C)  TempSrc: Bladder     SpO2: 96%  96% 97%  Weight:  62.2 kg    Height:        Intake/Output Summary (Last 24 hours) at 03/09/2020 1005  Last data filed at 03/09/2020 0600 Gross per 24 hour  Intake 5077.43 ml  Output 954 ml  Net 4123.43 ml   Filed Weights   03/08/20 0724 03/08/20 1315 03/09/20 0409  Weight: 58.5 kg 59.6 kg 62.2 kg    Examination:  General exam: Appears calm and comfortable  Respiratory system: Clear to auscultation. Respiratory effort normal. Cardiovascular system: S1 & S2 heard, RRR. No JVD, murmurs, rubs, gallops or clicks. No pedal edema. Gastrointestinal system: Abdomen is nondistended, soft and nontender. No organomegaly or masses felt. Normal bowel sounds heard. Central nervous system: Alert and oriented to self and place.  Right-sided facial droop.  Right-sided weakness.. No focal neurological deficits. Extremities: Symmetric 5 x 5 power. Skin: No rashes, lesions or ulcers Psychiatry: Judgement and insight appear poor to fair. Mood & affect appropriate.     Data Reviewed: I have  personally reviewed following labs and imaging studies  CBC: Recent Labs  Lab 03/08/20 0858 03/08/20 1401 03/09/20 0233  WBC 20.5* 22.3* 17.1*  NEUTROABS 16.7*  --   --   HGB 13.9 13.4 11.2*  HCT 43.8 41.2 32.3*  MCV 92.4 89.6 84.1  PLT 316 394 271    Basic Metabolic Panel: Recent Labs  Lab 03/08/20 0858 03/08/20 1401 03/09/20 0233  NA 139 140 139  K 6.4* 5.7* 3.7  CL 88* 94* 91*  CO2 <7* 7* 21*  GLUCOSE 184* 246* 97  BUN 149* 109* 122*  CREATININE 9.51* 9.33* 8.37*  CALCIUM 8.9 8.0* 7.2*    GFR: Estimated Creatinine Clearance: 5.2 mL/min (A) (by C-G formula based on SCr of 8.37 mg/dL (H)).  Liver Function Tests: Recent Labs  Lab 03/08/20 0858 03/09/20 0233  AST 27 17  ALT 20 18  ALKPHOS 60 47  BILITOT 0.8 0.8  PROT 7.7 5.7*  ALBUMIN 4.2 3.2*    CBG: Recent Labs  Lab 03/08/20 1614 03/08/20 1921 03/08/20 2334 03/09/20 0326 03/09/20 0757  GLUCAP 206* 120* 80 100* 119*     Recent Results (from the past 240 hour(s))  SARS Coronavirus 2 by RT PCR (hospital order, performed in [facility] hospital lab) Nasopharyngeal Nasopharyngeal Swab     Status: None   Collection Time: 03/08/20  9:00 AM   Specimen: Nasopharyngeal Swab  Result Value Ref Range Status   SARS Coronavirus 2 NEGATIVE NEGATIVE Final    Comment: (NOTE) SARS-CoV-2 target nucleic acids are NOT DETECTED.  The SARS-CoV-2 RNA is generally detectable in upper and lower respiratory specimens during the acute phase of infection. The lowest concentration of SARS-CoV-2 viral copies this assay can detect is 250 copies / mL. A negative result does not preclude SARS-CoV-2 infection and should not be used as the sole basis for treatment or other patient management decisions.  A negative result may occur with improper specimen collection / handling, submission of specimen other than nasopharyngeal swab, presence of viral mutation(s) within the areas targeted by this assay, and inadequate number of  viral copies (<250 copies / mL). A negative result must be combined with clinical observations, patient history, and epidemiological information.  Fact Sheet for Patients:   BoilerBrush.com.cy  Fact Sheet for Healthcare Providers: https://pope.com/  This test is not yet approved or  cleared by the [place] FDA and has been authorized for detection and/or diagnosis of SARS-CoV-2 by FDA under an Emergency Use Authorization (EUA).  This EUA will remain in effect (meaning this test can be used) for the duration of the COVID-19 declaration under Section 564(b)(1) of  the Act, 21 U.S.C. section 360bbb-3(b)(1), unless the authorization is terminated or revoked sooner.  Performed at [facility], [address] [place], [place] [address]   MRSA PCR Screening     Status: None   Collection Time: 03/08/20  1:47 PM   Specimen: Nasopharyngeal  Result Value Ref Range Status   MRSA by PCR NEGATIVE NEGATIVE Final    Comment:        The GeneXpert MRSA Assay (FDA approved for NASAL specimens only), is one component of a comprehensive MRSA colonization surveillance program. It is not intended to diagnose MRSA infection nor to guide or monitor treatment for MRSA infections. Performed at [facility], [address] [place], [place] [address]          Radiology Studies: CT ABDOMEN PELVIS WO CONTRAST  Result Date: 03/08/2020 CLINICAL DATA:  Nausea and vomiting for 2 days. EXAM: CT ABDOMEN AND PELVIS WITHOUT CONTRAST TECHNIQUE: Multidetector CT imaging of the abdomen and pelvis was performed following the standard protocol without IV contrast. COMPARISON:  None. FINDINGS: Lower chest: The lung bases are clear. No infiltrates or effusions. The heart is normal in size. Aortic and coronary artery calcifications are noted. Hepatobiliary: No hepatic lesions are identified without contrast. No intrahepatic  biliary dilatation. The gallbladder is distended measuring 5.6 cm in diameter. I do not see any obvious pericholecystic inflammatory changes but recommend correlation with any right upper quadrant abdominal pain. Right upper quadrant ultrasound examination may be helpful for further evaluation if clinically indicated. No common bile duct dilatation. Pancreas: Moderate pancreatic atrophy but no mass, inflammation or ductal dilatation. Spleen: Normal size. No focal lesions. Adrenals/Urinary Tract: The adrenal glands and kidneys are grossly normal. No renal, ureteral or bladder calculi or obvious mass without contrast. There is a Foley catheter noted in the bladder. Stomach/Bowel: The stomach, duodenum, small bowel and colon are grossly normal without oral contrast. No acute inflammatory changes, mass lesions or obstructive findings. The terminal ileum is normal. The appendix is surgically absent. Moderate to advanced sigmoid colon diverticulosis but no findings to suggest acute diverticulitis. Vascular/Lymphatic: Moderate atherosclerotic calcifications involving the aorta and iliac arteries but no aneurysm. No mesenteric or retroperitoneal mass or adenopathy. Reproductive: Surgically absent. Other: No pelvic mass or adenopathy. No free pelvic fluid collections. No inguinal mass or adenopathy. No abdominal wall hernia or subcutaneous lesions. Small inguinal hernias containing fat. Musculoskeletal: No significant bony findings. Vertebral augmentation changes are noted at L2. No acute bony findings or worrisome bone lesions. IMPRESSION: 1. No acute abdominal/pelvic findings, mass lesions or adenopathy. 2. Distended gallbladder but no obvious pericholecystic inflammatory changes or gallstones. Recommend correlation with any right upper quadrant abdominal pain. Right upper quadrant ultrasound examination may be helpful for further evaluation if clinically indicated. 3. No renal, ureteral or bladder calculi or obvious mass  without contrast. 4. Sigmoid colon diverticulosis without findings for acute diverticulitis. Aortic Atherosclerosis (ICD10-I70.0). Electronically Signed   By: Rudie MeyerP.  Gallerani M.D.   On: 03/08/2020 10:47   CT HEAD WO CONTRAST  Result Date: 03/08/2020 CLINICAL DATA:  Encephalopathy, nausea, vomiting EXAM: CT HEAD WITHOUT CONTRAST TECHNIQUE: Contiguous axial images were obtained from the base of the skull through the vertex without intravenous contrast. COMPARISON:  02/15/2020 CT and MRI FINDINGS: Brain: There is atrophy and chronic small vessel disease changes. Previously seen acute left internal capsule infarct now visualized by CT. Previously described left parietal infarct not well visualized by CT. No new areas of infarction. No hemorrhage or  hydrocephalus. Vascular: No hyperdense vessel or unexpected calcification. Skull: No acute calvarial abnormality. Sinuses/Orbits: Visualized paranasal sinuses and mastoids clear. Orbital soft tissues unremarkable. Other: None IMPRESSION: Left posterior internal capsule lacunar infarct as seen on prior MRI is now seen by CT. No new areas of infarction. Atrophy, chronic small vessel disease. Electronically Signed   By: Charlett Nose M.D.   On: 03/08/2020 12:30   US RENAL  Result Date: 03/08/2020 CLINICAL DATA:  74 year old female with acute renal insufficiency. History of hypertension and diabetes. EXAM: RENAL / URINARY TRACT ULTRASOUND COMPLETE COMPARISON:  Right upper quadrant ultrasound dated 03/08/2020. FINDINGS: Right Kidney: Renal measurements: 10.5 x 4.6 x 5.3 cm = volume: 133 mL. Apparent increased echogenicity which may be artifactual and related to body habitus and technique. No hydronephrosis or shadowing stone. Left Kidney: Renal measurements: 10.2 x 4.6 x 4.6 cm = volume: 111 mL. Apparent increased parenchymal echogenicity which may be artifactual or related to body habitus and technique. No hydronephrosis or shadowing stone. Bladder: The urinary bladder is  decompressed around a Foley catheter and not visualized. Other: None. IMPRESSION: No hydronephrosis or shadowing stone. Electronically Signed   By: Elgie Collard M.D.   On: 03/08/2020 19:53   DG CHEST PORT 1 VIEW  Result Date: 03/09/2020 CLINICAL DATA:  Sepsis EXAM: PORTABLE CHEST 1 VIEW COMPARISON:  03/08/2020 FINDINGS: Heart is normal size. Minimal bibasilar opacities, likely atelectasis. No effusions or acute bony abnormality. IMPRESSION: Bibasilar atelectasis Electronically Signed   By: Charlett Nose M.D.   On: 03/09/2020 08:34   DG CHEST PORT 1 VIEW  Result Date: 03/08/2020 CLINICAL DATA:  Nausea and vomiting EXAM: PORTABLE CHEST 1 VIEW COMPARISON:  None. FINDINGS: The heart size and mediastinal contours are within normal limits. Both lungs are clear. No pleural effusion. The visualized skeletal structures are unremarkable. IMPRESSION: No acute process in the chest. Electronically Signed   By: Guadlupe Spanish M.D.   On: 03/08/2020 11:41   US Abdomen Limited RUQ  Result Date: 03/08/2020 CLINICAL DATA:  Elevated lipase. EXAM: ULTRASOUND ABDOMEN LIMITED RIGHT UPPER QUADRANT COMPARISON:  CT abdomen pelvis from same day. FINDINGS: Gallbladder: Distended with small amount of sludge. No gallstones or wall thickening visualized. No sonographic Murphy sign noted by sonographer. Common bile duct: Diameter: 5 mm, normal. Liver: No focal lesion identified. Diffusely increased in parenchymal echogenicity. Portal vein is patent on color Doppler imaging with normal direction of blood flow towards the liver. Other: None. IMPRESSION: 1. Distended gallbladder with small amount of sludge. No sonographic evidence of acute cholecystitis. 2. Hepatic steatosis. Electronically Signed   By: Obie Dredge M.D.   On: 03/08/2020 12:58        Scheduled Meds: . Chlorhexidine Gluconate Cloth  6 each Topical Daily  . insulin aspart  0-6 Units Subcutaneous Q4H  . mouth rinse  15 mL Mouth Rinse BID  . [START ON  03/12/2020] pantoprazole  40 mg Intravenous Q12H  . vancomycin variable dose per unstable renal function (pharmacist dosing)   Does not apply See admin instructions   Continuous Infusions: . ceFEPime (MAXIPIME) IV    . pantoprozole (PROTONIX) infusion 8 mg/hr (03/09/20 0600)  .  sodium bicarbonate (isotonic) infusion in sterile water 150 mL/hr at 03/09/20 0600     LOS: 1 day    Time spent: 40 minutes    Ramiro Harvest, MD [facility]   To contact the attending provider between 7A-7P or the covering provider during after hours 7P-7A, please log into the web site  [email or web address] and access using universal [place] password for that web site. If you do not have the password, please call the hospital operator.  03/09/2020, 10:05 AM

## 2020-03-09 NOTE — Progress Notes (Signed)
Follow up completed, recommend nectar liquid due to s/s of aspiration with thin.  Full note to follow.  Rolena Infante, MS [facility] SLP Acute [employer] [phone]

## 2020-03-09 NOTE — TOC Initial Note (Signed)
Transition of Care [facility]) - Initial/Assessment Note    Patient Details  Name: Tasha [name] MRN: [id number] Date of Birth: [birth year]  Transition of Care [facility]) CM/SW Contact:    Armanda Heritage, RN Phone Number: 03/09/2020, 12:55 PM  Clinical Narrative:    CM spoke with [facility] rep and confirmed patient was at facility for short term rehab.  Per facility rep patient can return when medically stable.  CM spoke with patient's son and confirmed that he would like for patient to return to [place].  Son expresses that he is also considering the need for long term SNF stay and need for medicaid.  Cm advised son that facility can assist with long term medicaid application as well as coordinating a long term stay at facility once patient completes her rehab stay.  Plan at this time is for patient to return to [facility], facility will need updated FL2 and dc summary.                Expected Discharge Plan: Skilled Nursing Facility Barriers to Discharge: Continued Medical Work up   Patient Goals and CMS Choice Patient states their goals for this hospitalization and ongoing recovery are:: to go back to [facility].gov Compare Post Acute Care list provided to:: Patient Represenative (must comment) Choice offered to / list presented to : Adult Children  Expected Discharge Plan and Services Expected Discharge Plan: Skilled Nursing Facility     Post Acute Care Choice: Nursing Home, Resumption of Svcs/PTA Provider   Expected Discharge Date:  (unknown)                                    Prior Living Arrangements/Services     Patient language and need for interpreter reviewed:: Yes        Need for Family Participation in Patient Care: Yes (Comment) Care giver support system in place?: Yes (comment)   Criminal Activity/Legal Involvement Pertinent to Current Situation/Hospitalization: No - Comment as needed  Activities of Daily Living Home Assistive  Devices/Equipment: Blood pressure cuff, Eyeglasses, Grab bars around toilet, Grab bars in shower, Hand-held shower hose, Hospital bed, [employer], Scales, Environmental consultant (specify type) (rollator) ADL Screening (condition at time of admission) Patient's cognitive ability adequate to safely complete daily activities?: No Is the patient deaf or have difficulty hearing?: No Does the patient have difficulty seeing, even when wearing glasses/contacts?: No Does the patient have difficulty concentrating, remembering, or making decisions?: Yes Patient able to express need for assistance with ADLs?: Yes Does the patient have difficulty dressing or bathing?: Yes Independently performs ADLs?: No Communication: 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) Dressing (OT): Dependent Is this a change from baseline?: Change from baseline, expected to last >3 days Grooming: Dependent Is this a change from baseline?: Change from baseline, expected to last >3 days Feeding: Dependent Is this a change from baseline?: Change from baseline, expected to last >3 days Bathing: Dependent Is this a change from baseline?: Change from baseline, expected to last >3 days Toileting: Dependent Is this a change from baseline?: Change from baseline, expected to last >3days In/Out Bed: Dependent Is this a change from baseline?: Change from baseline, expected to last >3 days Walks in Home: Dependent Is this a change from baseline?: Change from baseline, expected to last >3 days Does the patient have difficulty walking or climbing stairs?: Yes (secondary to weakness) Weakness of Legs: Both Weakness of Arms/Hands: Both  Permission  Sought/Granted                  Emotional Assessment       Orientation: : Fluctuating Orientation (Suspected and/or reported Sundowners)   Psych Involvement: No (comment)  Admission diagnosis:  Hyperkalemia [E87.5] Lactic acidosis [E87.2] Abnormal serum level of lipase [R74.8] AKI (acute kidney  injury) (HCC) [N17.9] Nausea and vomiting, intractability of vomiting not specified, unspecified vomiting type [R11.2] Patient Active Problem List   Diagnosis Date Noted  . Hyperkalemia   . Sepsis (HCC)   . Coffee ground emesis   . Lactic acidosis 03/08/2020  . Essential (primary) hypertension   . History of falling   . Diabetes mellitus without complication (HCC)   . Nausea and vomiting   . AKI (acute kidney injury) (HCC)   . DM (diabetes mellitus), type 2 with peripheral vascular complications (HCC) 02/20/2020  . Atherosclerosis of aorta (HCC) 02/20/2020  . Poorly-controlled hypertension 02/20/2020  . Dyslipidemia 02/20/2020  . Fall 02/15/2020  . CVA (cerebral vascular accident) (HCC) 02/15/2020   PCP:  Patient, No Pcp Per Pharmacy:   [facility] [phone] - [place], [place] - [phone] [facility] [address] [place] [address] Phone: [phone] Fax: [phone]  [facility] [place], [place] - [address] [place] [address] Phone: [phone] Fax: [phone]  [facility] [place], [place] [address] [place] [address] Phone: [phone] Fax: [phone]     Social Determinants of Health (SDOH) Interventions    Readmission Risk Interventions No flowsheet data found.
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
## 2020-03-09 NOTE — Progress Notes (Signed)
 [facility] Progress Note  Subjective: creat down somewhat   Vitals:   03/09/20 0800 03/09/20 0900 03/09/20 1000 03/09/20 1100  BP: (!) 164/61 (!) 162/60 134/67 (!) 142/65  Pulse: 82 83 81 80  Resp: 16 12 13 17   Temp: 98.6 F (37 C) 98.8 F (37.1 C) 98.8 F (37.1 C) 98.8 F (37.1 C)  TempSrc: Bladder     SpO2: 99% 97% 98% 99%  Weight:      Height:        Exam: Gen elderly WF, not in distress Very dry mouth No rash, cyanosis or gangrene Sclera anicteric  No jvd or bruits Chest clear bilat to bases no rales or wheezing RRR no MRG  Abd soft ntnd no mass or ascites +bs GU foley cath draining small amts of light yellow urine MS no joint effusions or deformity Ext no LE or UE edema, no wounds or ulcers Neuro is lethargic, arouses easily, follows simple commands    Home meds:  - norvasc 10/ plavix 75/ HCTZ 25 / metoprolol 25 bid/ crestor 25 qd/ asa 81  - metformin 1gm qam  - prn's/ vitamins/ supplements   UA 0-5 rbc/ wbc, rare bact, 100 prot, small Hb, hazy   UNa 82, UCr 58 CXR - no acute disease CT abd/ pelv wo contrast > Adrenals/Urinary Tract: The adrenal glands and kidneys are grossly normal. No renal, ureteral or bladder calculi or obvious mass without contrast. There is a Foley catheter noted in the bladder.   Renal - 10.5/ 10.2 cm kidneys, ^d echo, no hydro  Assessment/ Plan: 1. Acute renal failure - normal creat in June 2021. AKI of unclear cause, suspect pre-renal given hx N/V, may have septic picture as well w/ low temp /  ^WBC. Urine lytes c/w ATN. Kidneys appear wnl on CT abd and UA negative. She is making urine w/ volume loading. Cont w/ IVF's bicarb gtt at 150 cc/hr for now. Pt remains alert, no overt uremic symptoms other than possibly confusion, but would like to avoid RRT if we can for this elderly lady w/ recent recurrent CVA's and decline. Have d/w the son and questions answered.  2. July 2021 - r/o sepsis. UA and CXR unremarkable, on  empiric IV abx 3. HTN - holding home BP meds , have dc'd metoprolol for now 4. DM2 - on oral agents at home.  Per pmd.  5. H/o CVA - details unknown     Rob Ellanora Rayborn 03/09/2020, 11:19 AM   Recent Labs  Lab 03/08/20 1401 03/09/20 0233  K 5.7* 3.7  BUN 109* 122*  CREATININE 9.33* 8.37*  CALCIUM 8.0* 7.2*  HGB 13.4 11.2*   Inpatient medications: . Chlorhexidine Gluconate Cloth  6 each Topical Daily  . insulin aspart  0-6 Units Subcutaneous TID WC  . mouth rinse  15 mL Mouth Rinse BID  . [START ON 03/12/2020] pantoprazole  40 mg Intravenous Q12H  . vancomycin variable dose per unstable renal function (pharmacist dosing)   Does not apply See admin instructions   . ceFEPime (MAXIPIME) IV    . pantoprozole (PROTONIX) infusion 8 mg/hr (03/09/20 0600)  .  sodium bicarbonate (isotonic) infusion in sterile water 150 mL/hr at 03/09/20 1118   acetaminophen **OR** acetaminophen, ondansetron (ZOFRAN) IV, polyethylene glycol, Resource ThickenUp Clear

## 2020-03-09 NOTE — NC FL2 (Signed)
[place] MEDICAID FL2 LEVEL OF CARE SCREENING TOOL     IDENTIFICATION  Patient Name: Tasha [name] Birthdate: [birth year] Sex: female Admission Date (Current Location): 03/08/2020  [facility] and [place] Number:  Producer, television/film/video and Address:  [facility],  [address] [place], [place] [address]      Provider Number: [id number]  Attending Physician Name and Address:  Rodolph Bong, MD  Relative Name and Phone Number:       Current Level of Care: Hospital Recommended Level of Care: Skilled Nursing Facility Prior Approval Number:    Date Approved/Denied:   PASRR Number: [id number]  Discharge Plan: SNF    Current Diagnoses: Patient Active Problem List   Diagnosis Date Noted  . Hyperkalemia   . Sepsis (HCC)   . Coffee ground emesis   . Lactic acidosis 03/08/2020  . Essential (primary) hypertension   . History of falling   . Diabetes mellitus without complication (HCC)   . Nausea and vomiting   . AKI (acute kidney injury) (HCC)   . DM (diabetes mellitus), type 2 with peripheral vascular complications (HCC) 02/20/2020  . Atherosclerosis of aorta (HCC) 02/20/2020  . Poorly-controlled hypertension 02/20/2020  . Dyslipidemia 02/20/2020  . Fall 02/15/2020  . CVA (cerebral vascular accident) (HCC) 02/15/2020    Orientation RESPIRATION BLADDER Height & Weight     Self  Normal Incontinent Weight: 62.2 kg Height:  5\' 4"  (162.6 cm)  BEHAVIORAL SYMPTOMS/MOOD NEUROLOGICAL BOWEL NUTRITION STATUS      Continent Diet  AMBULATORY STATUS COMMUNICATION OF NEEDS Skin   Extensive Assist Verbally Normal                       Personal Care Assistance Level of Assistance  Feeding, Bathing, Dressing Bathing Assistance: Maximum assistance Feeding assistance: Limited assistance Dressing Assistance: Maximum assistance     Functional Limitations Info  Hearing, Sight, Speech Sight Info: Adequate Hearing Info: Adequate Speech Info: Adequate    SPECIAL CARE  FACTORS FREQUENCY  OT (By licensed OT), PT (By licensed PT)     PT Frequency: 5x weekly OT Frequency: 5x weekly            Contractures Contractures Info: Not present    Additional Factors Info  Code Status, Allergies, Insulin Sliding Scale Code Status Info: Full Code Allergies Info: allopurinol, tetracycline, amoxicillin-pot clavulanate, atorvastatin, azithromycin, codeine, lisinopril, sulfa antibiotics   Insulin Sliding Scale Info: insulin aspart (novolog) injection 0-6 units 3x daily with meals       Current Medications (03/09/2020):  This is the current hospital active medication list Current Facility-Administered Medications  Medication Dose Route Frequency Provider Last Rate Last Admin  . 0.9 %  sodium chloride infusion   Intravenous PRN 03/11/2020, MD 10 mL/hr at 03/09/20 1233 250 mL at 03/09/20 1233  . acetaminophen (TYLENOL) tablet 650 mg  650 mg Oral Q6H PRN 03/11/20., MD       Or  . acetaminophen (TYLENOL) suppository 650 mg  650 mg Rectal Q6H PRN Zigmund Daniel., MD      . ceFEPIme (MAXIPIME) 1 g in sodium chloride 0.9 % 100 mL IVPB  1 g Intravenous Q24H Wofford, Drew A, RPH 200 mL/hr at 03/09/20 1235 1 g at 03/09/20 1235  . Chlorhexidine Gluconate Cloth 2 % PADS 6 each  6 each Topical Daily 03/11/20., MD   6 each at 03/08/20 2100  . insulin aspart (novoLOG) injection 0-6 Units  0-6 Units Subcutaneous TID WC Rodolph Bong, MD      . MEDLINE mouth rinse  15 mL Mouth Rinse BID Zigmund Daniel., MD   15 mL at 03/09/20 0915  . ondansetron (ZOFRAN) injection 4 mg  4 mg Intravenous Q8H PRN Zigmund Daniel., MD      . pantoprazole (PROTONIX) 80 mg in sodium chloride 0.9 % 100 mL (0.8 mg/mL) infusion  8 mg/hr Intravenous Continuous Zigmund Daniel., MD 10 mL/hr at 03/09/20 1209 8 mg/hr at 03/09/20 1209  . [START ON 03/12/2020] pantoprazole (PROTONIX) injection 40 mg  40 mg Intravenous Q12H Zigmund Daniel., MD       . polyethylene glycol [facility] / Ethelene Hal) packet 17 g  17 g Oral Daily PRN Zigmund Daniel., MD      . Resource ThickenUp Clear   Oral PRN Rodolph Bong, MD      . vancomycin variable dose per unstable renal function (pharmacist dosing)   Does not apply See admin instructions Danford Bad, [facility]         Discharge Medications: Please see discharge summary for a list of discharge medications.  Relevant Imaging Results:  Relevant Lab Results:   Additional Information SS[id number]  [place], [place]

## 2020-03-10 LAB — GLUCOSE, CAPILLARY
Glucose-Capillary: 136 mg/dL — ABNORMAL HIGH (ref 70–99)
Glucose-Capillary: 158 mg/dL — ABNORMAL HIGH (ref 70–99)
Glucose-Capillary: 153 mg/dL — ABNORMAL HIGH (ref 70–99)
Glucose-Capillary: 119 mg/dL — ABNORMAL HIGH (ref 70–99)
Glucose-Capillary: 132 mg/dL — ABNORMAL HIGH (ref 70–99)
Glucose-Capillary: 133 mg/dL — ABNORMAL HIGH (ref 70–99)
Glucose-Capillary: 150 mg/dL — ABNORMAL HIGH (ref 70–99)

## 2020-03-10 LAB — URINE CULTURE: Culture: NO GROWTH

## 2020-03-10 LAB — CBC WITH DIFFERENTIAL/PLATELET
Abs Immature Granulocytes: 0.15 10*3/uL — ABNORMAL HIGH (ref 0.00–0.07)
Basophils Absolute: 0 10*3/uL (ref 0.0–0.1)
Basophils Relative: 0 %
Eosinophils Absolute: 0 10*3/uL (ref 0.0–0.5)
Eosinophils Relative: 0 %
HCT: 28.9 % — ABNORMAL LOW (ref 36.0–46.0)
Hemoglobin: 10 g/dL — ABNORMAL LOW (ref 12.0–15.0)
Immature Granulocytes: 1 %
Lymphocytes Relative: 14 %
Lymphs Abs: 1.6 10*3/uL (ref 0.7–4.0)
MCH: 28.6 pg (ref 26.0–34.0)
MCHC: 34.6 g/dL (ref 30.0–36.0)
MCV: 82.6 fL (ref 80.0–100.0)
Monocytes Absolute: 0.9 10*3/uL (ref 0.1–1.0)
Monocytes Relative: 8 %
Neutro Abs: 8.9 10*3/uL — ABNORMAL HIGH (ref 1.7–7.7)
Neutrophils Relative %: 77 %
Platelets: 201 10*3/uL (ref 150–400)
RBC: 3.5 MIL/uL — ABNORMAL LOW (ref 3.87–5.11)
RDW: 13.4 % (ref 11.5–15.5)
WBC: 11.6 10*3/uL — ABNORMAL HIGH (ref 4.0–10.5)
nRBC: 0 % (ref 0.0–0.2)

## 2020-03-10 LAB — BASIC METABOLIC PANEL
Anion gap: 24 — ABNORMAL HIGH (ref 5–15)
BUN: 130 mg/dL — ABNORMAL HIGH (ref 8–23)
CO2: 35 mmol/L — ABNORMAL HIGH (ref 22–32)
Calcium: 6.4 mg/dL — CL (ref 8.9–10.3)
Chloride: 82 mmol/L — ABNORMAL LOW (ref 98–111)
Creatinine, Ser: 7.99 mg/dL — ABNORMAL HIGH (ref 0.44–1.00)
GFR calc Af Amer: 5 mL/min — ABNORMAL LOW (ref >60)
GFR calc non Af Amer: 5 mL/min — ABNORMAL LOW (ref >60)
Glucose, Bld: 135 mg/dL — ABNORMAL HIGH (ref 70–99)
Potassium: 3.4 mmol/L — ABNORMAL LOW (ref 3.5–5.1)
Sodium: 141 mmol/L (ref 135–145)

## 2020-03-10 LAB — LIPASE, BLOOD: Lipase: 99 U/L — ABNORMAL HIGH (ref 11–51)

## 2020-03-10 LAB — HEPATIC FUNCTION PANEL
ALT: 19 U/L (ref 0–44)
AST: 32 U/L (ref 15–41)
Albumin: 2.8 g/dL — ABNORMAL LOW (ref 3.5–5.0)
Alkaline Phosphatase: 50 U/L (ref 38–126)
Bilirubin, Direct: 0.1 mg/dL (ref 0.0–0.2)
Indirect Bilirubin: 1.1 mg/dL — ABNORMAL HIGH (ref 0.3–0.9)
Total Bilirubin: 1.2 mg/dL (ref 0.3–1.2)
Total Protein: 5.5 g/dL — ABNORMAL LOW (ref 6.5–8.1)

## 2020-03-10 LAB — RENAL FUNCTION PANEL
Albumin: 2.8 g/dL — ABNORMAL LOW (ref 3.5–5.0)
Anion gap: 28 — ABNORMAL HIGH (ref 5–15)
BUN: 143 mg/dL — ABNORMAL HIGH (ref 8–23)
CO2: 31 mmol/L (ref 22–32)
Calcium: 6.5 mg/dL — ABNORMAL LOW (ref 8.9–10.3)
Chloride: 81 mmol/L — ABNORMAL LOW (ref 98–111)
Creatinine, Ser: 8.17 mg/dL — ABNORMAL HIGH (ref 0.44–1.00)
GFR calc Af Amer: 5 mL/min — ABNORMAL LOW (ref >60)
GFR calc non Af Amer: 4 mL/min — ABNORMAL LOW (ref >60)
Glucose, Bld: 150 mg/dL — ABNORMAL HIGH (ref 70–99)
Phosphorus: 6.5 mg/dL — ABNORMAL HIGH (ref 2.5–4.6)
Potassium: 2.8 mmol/L — ABNORMAL LOW (ref 3.5–5.1)
Sodium: 140 mmol/L (ref 135–145)

## 2020-03-10 LAB — CREATININE, SERUM
Creatinine, Ser: 8.05 mg/dL — ABNORMAL HIGH (ref 0.44–1.00)
GFR calc Af Amer: 5 mL/min — ABNORMAL LOW (ref >60)
GFR calc non Af Amer: 4 mL/min — ABNORMAL LOW (ref >60)

## 2020-03-10 LAB — TROPONIN I (HIGH SENSITIVITY): Troponin I (High Sensitivity): 27 ng/L — ABNORMAL HIGH (ref <18)

## 2020-03-10 LAB — MAGNESIUM
Magnesium: 1.5 mg/dL — ABNORMAL LOW (ref 1.7–2.4)
Magnesium: 1.6 mg/dL — ABNORMAL LOW (ref 1.7–2.4)

## 2020-03-10 LAB — VANCOMYCIN, RANDOM: Vancomycin Rm: 15

## 2020-03-10 MED ORDER — CHLORHEXIDINE GLUCONATE 0.12 % MT SOLN
15.0000 mL | Freq: Two times a day (BID) | OROMUCOSAL | Status: DC
  Administered 2020-03-10 – 2020-04-27 (×94): 15 mL via OROMUCOSAL
  Filled 2020-03-10 (×87): qty 15

## 2020-03-10 MED ORDER — MAGNESIUM SULFATE 50 % IJ SOLN
0.5000 g | Freq: Once | INTRAVENOUS | Status: AC
  Administered 2020-03-10: 0.5 g via INTRAVENOUS
  Filled 2020-03-10: qty 1

## 2020-03-10 MED ORDER — POTASSIUM CHLORIDE CRYS ER 20 MEQ PO TBCR: 40.0000 meq | EXTENDED_RELEASE_TABLET | Freq: Three times a day (TID) | ORAL | Status: DC

## 2020-03-10 MED ORDER — SODIUM CHLORIDE 0.9 % IV SOLN
2.0000 g | INTRAVENOUS | Status: AC
  Administered 2020-03-10 – 2020-03-14 (×5): 2 g via INTRAVENOUS
  Filled 2020-03-10: qty 2
  Filled 2020-03-10 (×5): qty 20

## 2020-03-10 MED ORDER — MAGNESIUM SULFATE IN D5W 1-5 GM/100ML-% IV SOLN: 1.0000 g | Freq: Once | INTRAVENOUS | Status: DC

## 2020-03-10 MED ORDER — POTASSIUM CHLORIDE 10 MEQ/100ML IV SOLN
10.0000 meq | INTRAVENOUS | Status: AC
  Administered 2020-03-10 (×3): 10 meq via INTRAVENOUS
  Filled 2020-03-10 (×3): qty 100

## 2020-03-10 MED ORDER — ORAL CARE MOUTH RINSE
15.0000 mL | Freq: Two times a day (BID) | OROMUCOSAL | Status: DC
  Administered 2020-03-10 – 2020-03-11 (×4): 15 mL via OROMUCOSAL

## 2020-03-10 MED ORDER — POTASSIUM CHLORIDE 20 MEQ PO PACK
40.0000 meq | PACK | Freq: Once | ORAL | Status: DC
  Filled 2020-03-10: qty 2

## 2020-03-10 MED ORDER — LACTATED RINGERS IV SOLN: INTRAVENOUS | Status: DC

## 2020-03-10 MED ORDER — SODIUM CHLORIDE 0.45 % IV SOLN
INTRAVENOUS | Status: DC
  Filled 2020-03-10 (×5): qty 1000

## 2020-03-10 MED ORDER — CALCIUM CARBONATE ANTACID 500 MG PO CHEW: 1.0000 | CHEWABLE_TABLET | Freq: Three times a day (TID) | ORAL | Status: DC
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
## 2020-03-10 NOTE — Progress Notes (Addendum)
PROGRESS NOTE    Tasha [name]  GEZ:[id number] DOB: 01-Dec-1945 DOA: 03/08/2020 PCP: Patient, No Pcp Per   Chief Complaint  Patient presents with  . Emesis    Brief Narrative Tasha [name] Tasha [name] 74 y.o.femalewith medical history significant ofstroke, T2DM, HTN, MI who was recently discharged 01/2020 after Tasha [name] stroke workup who represented on day of admission with nausea/vomiting.   History was limited due to the patient's mental status. She says "I don't know" when asked why she is here. Hx obtained from pt, staff, [facility], and chart. 2 days of nausea and vomiting which has progressively gotten worse. Staff reports progression to brown emesis. She denies fevers, chest pain. Endorses nausea, vomiting, and abdominal pain. Her son says at baseline, she's alert and oriented.   ED Course:Noted to have significant lactic acidosis and AKI. IVF, labs, EKG, imaging, antibiotics. Hospitalist to admit for aki/lactic acidosis and possible sepsis. Nephrology was c/s by the ED.   Patient admitted, placed empirically on IV antibiotics, pancultured, placed on aggressive IV fluid resuscitation with bicarb.  Nephrology consulted.  The evening of admission 03/08/2020 patient noted to have some coffee-ground emesis.  Hemoccult negative.  Patient placed on Rayshaun Needle Protonix drip.  Assessment & Plan:   Active Problems:   CVA (cerebral vascular accident) (HCC)   DM (diabetes mellitus), type 2 with peripheral vascular complications (HCC)   Essential (primary) hypertension   Lactic acidosis   Nausea and vomiting   AKI (acute kidney injury) (HCC)   Hyperkalemia   Sepsis (HCC)   Coffee ground emesis  Acute Kidney Injury Unclear etiology, suspected prerenal - possibly related to sepsis, ? Volume/hemodynamically mediated with nausea/vomiting on presentation. Renal US without hydro, CT without hydro Creatinine with modest improvement - 8.17 today Adequate UOP, foley for strict I/O Continue IVF Renal  c/s, appreciate recs   Concern for Sepsis  Leukocytosis  Hypothermia: hypothermia, leukocytosis, lactic acidosis at presentation, concerning for sepsis No clear source identified  CXR with atelectasis on 7/13 CT abd/pelvis without acute findings, RUQ [place] without evidence of cholecystitis UA not suggestive of UTI Blood cx NGTD x2, MRSA PCR negative Leukocytosis imrpoving Vanc/cefepime -> will deescalate to ceftriaxone   Acute Metabolic Encephalopathy 2/2 above, uremia, possible infection  Renal following for uremia with aki, renal function slowly improving Continue abx for possible infection  TSH wnl, B12 wnl, folate wnl Head CT with L posterior internal capsule infarct, no new areas of infarction Delirium precautions   AGMA  Lactic Acidosis: bicarb 31 today, resolved, transition to LR for IVF from IV bicarb  Nausea  Vomiting  Coffee Ground Emesis:  Coffee ground emesis noted on day of admission around 1949 (see nursing note) She's been started on PPI gtt - hemoccult was negative - ? Mallory weiss Hemoglobin downtrending, but suspect 2/2 dilution  Consider GI consultation if recurrent or significantly downtrending Hb Hold plavix and DVT ppx Elevated lipase, follow repeat  Dysphagia: nectar thick liquid per speech - today she's not taking PO safely - may need to consider NG tube and tube feeds if this is persistent  Hyperkalemia  Hypokalemia: now hypokalemic -> replace conservatively with AKI  Hx CVA:  Patient with recent stroke discharged on 02/19/2020 was supposed to be on dual antiplatelet therapy x3 weeks then Plavix alone.  Patient noted to need Tasha [name] repeat MRI in 3 months as well as 6-week follow-up with neurology.  Patient does have Kimesha Claxton chronic persistent right-sided weakness.  Statin on hold.  Plavix on hold. PT/OT  T2DM:  A1c 6.9 01/2020 SSI  Hypertension:  Holding norvasc, HCTZ, metoprolol  DVT prophylaxis: SCD Code Status: full  Family Communication: none at  bedside Disposition:   Status is: Inpatient  Remains inpatient appropriate because:Inpatient level of care appropriate due to severity of illness   Dispo: The patient is from: SNF              Anticipated d/c is to: pending              Anticipated d/c date is: > 3 days              Patient currently is not medically stable to d/c.   Consultants:   renal  Procedures:   none  Antimicrobials: Anti-infectives (From admission, onward)   Start     Dose/Rate Route Frequency Ordered Stop   03/09/20 1000  ceFEPIme (MAXIPIME) 1 g in sodium chloride 0.9 % 100 mL IVPB     Discontinue     1 g 200 mL/hr over 30 Minutes Intravenous Every 24 hours 03/08/20 1219     03/08/20 1219  vancomycin variable dose per unstable renal function (pharmacist dosing)  Status:  Discontinued         Does not apply See admin instructions 03/08/20 1219 03/10/20 0906   03/08/20 1100  vancomycin (VANCOCIN) IVPB 1000 mg/200 mL premix        1,000 mg 200 mL/hr over 60 Minutes Intravenous  Once 03/08/20 1046 03/08/20 1406   03/08/20 1100  ceFEPIme (MAXIPIME) 2 g in sodium chloride 0.9 % 100 mL IVPB        2 g 200 mL/hr over 30 Minutes Intravenous  Once 03/08/20 1046 03/08/20 1211     Subjective: Doesn't answer questions consistently Knows hospital, can't say which.  Doesn't know month. No complaint of pain.  Objective: Vitals:   03/10/20 0200 03/10/20 0300 03/10/20 0400 03/10/20 0500  BP: (!) 115/50 121/73 (!) 126/54 (!) 149/68  [name]: 65 77 62 84  Resp: (!) 9  Temp: 98.8 F (37.1 C) 98.6 F (37 C) 98.8 F (37.1 C) 98.6 F (37 C)  TempSrc:      SpO2: 92% 93% 93% (!) 87%  Weight:      Height:        Intake/Output Summary (Last 24 hours) at 03/10/2020 0849 Last data filed at 03/10/2020 0846 Gross per 24 hour  Intake 3214.97 ml  Output 2250 ml  Net 964.97 ml   Filed Weights   03/08/20 0724 03/08/20 1315 03/09/20 0409  Weight: 58.5 kg 59.6 kg 62.2 kg    Examination:  General exam:  Appears calm and comfortable  Respiratory system: Clear to auscultation. Respiratory effort normal. Cardiovascular system: S1 & S2 heard, RRR.  Gastrointestinal system: Abdomen is nondistended, soft and nontender.  Central nervous system: Alert and disoriented (Tasha [name]&Ox1-2). Chronic R sided weakness, doesn't follow commands consistently. Extremities: no LEE Skin: No rashes, lesions or ulcers  Data Reviewed: I have personally reviewed following labs and imaging studies  CBC: Recent Labs  Lab 03/08/20 0858 03/08/20 1401 03/09/20 0233 03/10/20 0229  WBC 20.5* 22.3* 17.1* 11.6*  NEUTROABS 16.7*  --   --  8.9*  HGB 13.9 13.4 11.2* 10.0*  HCT 43.8 41.2 32.3* 28.9*  MCV 92.4 89.6 84.1 82.6  PLT 316 394 271 201    Basic Metabolic Panel: Recent Labs  Lab 03/08/20 0858 03/08/20 1401 03/09/20 0233 03/10/20 0229  NA 139 140 139 140  K 6.4* 5.7* 3.7 2.8*  CL 88* 94* 91* 81*  CO2 <7* 7* 21* 31  GLUCOSE 184* 246* 97 150*  BUN 149* 109* 122* 143*  CREATININE 9.51* 9.33* 8.37* 8.17*  8.05*  CALCIUM 8.9 8.0* 7.2* 6.5*  PHOS  --   --   --  6.5*    GFR: Estimated Creatinine Clearance: 5.4 mL/min (Tasha [name]) (by C-G formula based on SCr of 8.05 mg/dL (H)).  Liver Function Tests: Recent Labs  Lab 03/08/20 0858 03/09/20 0233 03/10/20 0229  AST 27 17  --   ALT 20 18  --   ALKPHOS 60 47  --   BILITOT 0.8 0.8  --   PROT 7.7 5.7*  --   ALBUMIN 4.2 3.2* 2.8*    CBG: Recent Labs  Lab 03/09/20 1543 03/09/20 1922 03/09/20 2319 03/10/20 0318 03/10/20 0740  GLUCAP 128* 130* 136* 158* 153*     Recent Results (from the past 240 hour(s))  SARS Coronavirus 2 by RT PCR (hospital order, performed in [facility] hospital lab) Nasopharyngeal Nasopharyngeal Swab     Status: None   Collection Time: 03/08/20  9:00 AM   Specimen: Nasopharyngeal Swab  Result Value Ref Range Status   SARS Coronavirus 2 NEGATIVE NEGATIVE Final    Comment: (NOTE) SARS-CoV-2 target nucleic acids are NOT  DETECTED.  The SARS-CoV-2 RNA is generally detectable in upper and lower respiratory specimens during the acute phase of infection. The lowest concentration of SARS-CoV-2 viral copies this assay can detect is 250 copies / mL. Tasha [name] negative result does not preclude SARS-CoV-2 infection and should not be used as the sole basis for treatment or other patient management decisions.  Tasha [name] negative result may occur with improper specimen collection / handling, submission of specimen other than nasopharyngeal swab, presence of viral mutation(s) within the areas targeted by this assay, and inadequate number of viral copies (<250 copies / mL). Tasha [name] negative result must be combined with clinical observations, patient history, and epidemiological information.  Fact Sheet for Patients:   BoilerBrush.com.cy  Fact Sheet for Healthcare Providers: https://pope.com/  This test is not yet approved or  cleared by the [place] FDA and has been authorized for detection and/or diagnosis of SARS-CoV-2 by FDA under an Emergency Use Authorization (EUA).  This EUA will remain in effect (meaning this test can be used) for the duration of the COVID-19 declaration under Section 564(b)(1) of the Act, 21 U.S.C. section 360bbb-3(b)(1), unless the authorization is terminated or revoked sooner.  Performed at [facility], [address], [place], [place] [address]   Culture, Urine     Status: None   Collection Time: 03/08/20 10:20 AM   Specimen: Urine, Random  Result Value Ref Range Status   Specimen Description   Final    URINE, RANDOM Performed at [facility], [address] [place], [place] [address]    Special Requests   Final    NONE Performed at [facility], [address] [place], [place] [address]    Culture   Final    NO GROWTH Performed at [facility], [address], [place],  [place] [address]    Report Status 03/10/2020 FINAL  Final  Blood culture (routine x 2)     Status: None (Preliminary result)   Collection Time: 03/08/20 11:45 AM   Specimen: BLOOD  Result Value Ref Range Status   Specimen Description   Final    BLOOD RIGHT ANTECUBITAL Performed at [facility], [address] [place], [place] [address]    Special Requests   Final    BOTTLES DRAWN AEROBIC AND ANAEROBIC Blood Culture adequate volume Performed at [facility], [address] [place], [place] [address]    Culture   Final    NO GROWTH 2 DAYS Performed at [facility], [address] [place], [place] [address]    Report Status PENDING  Incomplete  Blood culture (routine x 2)     Status: None (Preliminary result)   Collection Time: 03/08/20 12:05 PM   Specimen: BLOOD  Result Value Ref Range Status   Specimen Description   Final    BLOOD LEFT WRIST Performed at [facility], [address] [place], [place] [address]    Special Requests   Final    BOTTLES DRAWN AEROBIC AND ANAEROBIC Blood Culture adequate volume Performed at [facility], [address] [place], [place] [address]    Culture   Final    NO GROWTH 2 DAYS Performed at [facility], [address] [place], [place] [address]    Report Status PENDING  Incomplete  MRSA PCR Screening     Status: None   Collection Time: 03/08/20  1:47 PM   Specimen: Nasopharyngeal  Result Value Ref Range Status   MRSA by PCR NEGATIVE NEGATIVE Final    Comment:        The GeneXpert MRSA Assay (FDA approved for NASAL specimens only), is one component of [facility] comprehensive MRSA colonization surveillance program. It is not intended to diagnose MRSA infection nor to guide or monitor treatment for MRSA infections. Performed at [facility], [address] [place], [place] [address]          Radiology Studies: CT ABDOMEN PELVIS WO  CONTRAST  Result Date: 03/08/2020 CLINICAL DATA:  Nausea and vomiting for 2 days. EXAM: CT ABDOMEN AND PELVIS WITHOUT CONTRAST TECHNIQUE: Multidetector CT imaging of the abdomen and pelvis was performed following the standard protocol without IV contrast. COMPARISON:  None. FINDINGS: Lower chest: The lung bases are clear. No infiltrates or effusions. The heart is normal in size. Aortic and coronary artery calcifications are noted. Hepatobiliary: No hepatic lesions are identified without contrast. No intrahepatic biliary dilatation. The gallbladder is distended measuring 5.6 cm in diameter. I do not see any obvious pericholecystic inflammatory changes but recommend correlation with any right upper quadrant abdominal pain. Right upper quadrant ultrasound examination may be helpful for further evaluation if clinically indicated. No common bile duct dilatation. Pancreas: Moderate pancreatic atrophy but no mass, inflammation or ductal dilatation. Spleen: Normal size. No focal lesions. Adrenals/Urinary Tract: The adrenal glands and kidneys are grossly normal. No renal, ureteral or bladder calculi or obvious mass without contrast. There is Tasha [name] Foley catheter noted in the bladder. Stomach/Bowel: The stomach, duodenum, small bowel and colon are grossly normal without oral contrast. No acute inflammatory changes, mass lesions or obstructive findings. The terminal ileum is normal. The appendix is surgically absent. Moderate to advanced sigmoid colon diverticulosis but no findings to suggest acute diverticulitis. Vascular/Lymphatic: Moderate atherosclerotic calcifications involving the aorta and iliac arteries but no aneurysm. No mesenteric or retroperitoneal mass or adenopathy. Reproductive: Surgically absent. Other: No pelvic mass or adenopathy. No free pelvic fluid collections. No inguinal mass or adenopathy. No abdominal wall hernia or subcutaneous lesions. Small inguinal hernias containing fat. Musculoskeletal: No  significant bony findings. Vertebral augmentation changes are noted at L2. No acute bony findings or  worrisome bone lesions. IMPRESSION: 1. No acute abdominal/pelvic findings, mass lesions or adenopathy. 2. Distended gallbladder but no obvious pericholecystic inflammatory changes or gallstones. Recommend correlation with any right upper quadrant abdominal pain. Right upper quadrant ultrasound examination may be helpful for further evaluation if clinically indicated. 3. No renal, ureteral or bladder calculi or obvious mass without contrast. 4. Sigmoid colon diverticulosis without findings for acute diverticulitis. Aortic Atherosclerosis (ICD10-I70.0). Electronically Signed   By: Rudie Meyer M.D.   On: 03/08/2020 10:47   CT HEAD WO CONTRAST  Result Date: 03/08/2020 CLINICAL DATA:  Encephalopathy, nausea, vomiting EXAM: CT HEAD WITHOUT CONTRAST TECHNIQUE: Contiguous axial images were obtained from the base of the skull through the vertex without intravenous contrast. COMPARISON:  02/15/2020 CT and MRI FINDINGS: Brain: There is atrophy and chronic small vessel disease changes. Previously seen acute left internal capsule infarct now visualized by CT. Previously described left parietal infarct not well visualized by CT. No new areas of infarction. No hemorrhage or hydrocephalus. Vascular: No hyperdense vessel or unexpected calcification. Skull: No acute calvarial abnormality. Sinuses/Orbits: Visualized paranasal sinuses and mastoids clear. Orbital soft tissues unremarkable. Other: None IMPRESSION: Left posterior internal capsule lacunar infarct as seen on prior MRI is now seen by CT. No new areas of infarction. Atrophy, chronic small vessel disease. Electronically Signed   By: Charlett Nose M.D.   On: 03/08/2020 12:30   US RENAL  Result Date: 03/08/2020 CLINICAL DATA:  74 year old female with acute renal insufficiency. History of hypertension and diabetes. EXAM: RENAL / URINARY TRACT ULTRASOUND COMPLETE  COMPARISON:  Right upper quadrant ultrasound dated 03/08/2020. FINDINGS: Right Kidney: Renal measurements: 10.5 x 4.6 x 5.3 cm = volume: 133 mL. Apparent increased echogenicity which may be artifactual and related to body habitus and technique. No hydronephrosis or shadowing stone. Left Kidney: Renal measurements: 10.2 x 4.6 x 4.6 cm = volume: 111 mL. Apparent increased parenchymal echogenicity which may be artifactual or related to body habitus and technique. No hydronephrosis or shadowing stone. Bladder: The urinary bladder is decompressed around Seth Higginbotham Foley catheter and not visualized. Other: None. IMPRESSION: No hydronephrosis or shadowing stone. Electronically Signed   By: Elgie Collard M.D.   On: 03/08/2020 19:53   DG CHEST PORT 1 VIEW  Result Date: 03/09/2020 CLINICAL DATA:  Sepsis EXAM: PORTABLE CHEST 1 VIEW COMPARISON:  03/08/2020 FINDINGS: Heart is normal size. Minimal bibasilar opacities, likely atelectasis. No effusions or acute bony abnormality. IMPRESSION: Bibasilar atelectasis Electronically Signed   By: Charlett Nose M.D.   On: 03/09/2020 08:34   DG CHEST PORT 1 VIEW  Result Date: 03/08/2020 CLINICAL DATA:  Nausea and vomiting EXAM: PORTABLE CHEST 1 VIEW COMPARISON:  None. FINDINGS: The heart size and mediastinal contours are within normal limits. Both lungs are clear. No pleural effusion. The visualized skeletal structures are unremarkable. IMPRESSION: No acute process in the chest. Electronically Signed   By: Guadlupe Spanish M.D.   On: 03/08/2020 11:41   US Abdomen Limited RUQ  Result Date: 03/08/2020 CLINICAL DATA:  Elevated lipase. EXAM: ULTRASOUND ABDOMEN LIMITED RIGHT UPPER QUADRANT COMPARISON:  CT abdomen pelvis from same day. FINDINGS: Gallbladder: Distended with small amount of sludge. No gallstones or wall thickening visualized. No sonographic Murphy sign noted by sonographer. Common bile duct: Diameter: 5 mm, normal. Liver: No focal lesion identified. Diffusely increased in  parenchymal echogenicity. Portal vein is patent on color Doppler imaging with normal direction of blood flow towards the liver. Other: None. IMPRESSION: 1. Distended gallbladder with small amount of  sludge. No sonographic evidence of acute cholecystitis. 2. Hepatic steatosis. Electronically Signed   By: Obie Dredge M.D.   On: 03/08/2020 12:58        Scheduled Meds: . Chlorhexidine Gluconate Cloth  6 each Topical Daily  . insulin aspart  0-6 Units Subcutaneous TID WC  . mouth rinse  15 mL Mouth Rinse BID  . [START ON 03/12/2020] pantoprazole  40 mg Intravenous Q12H  . vancomycin variable dose per unstable renal function (pharmacist dosing)   Does not apply See admin instructions   Continuous Infusions: . sodium chloride 10 mL/hr at 03/09/20 1330  . ceFEPime (MAXIPIME) IV Stopped (03/09/20 1305)  . lactated ringers 125 mL/hr at 03/10/20 0735  . pantoprozole (PROTONIX) infusion 8 mg/hr (03/10/20 0738)  . potassium chloride       LOS: 2 days    Time spent: over 30 min    Lacretia Nicks, MD [facility]   To contact the attending provider between 7A-7P or the covering provider during after hours 7P-7A, please log into the web site [email or web address] and access using universal [place] password for that web site. If you do not have the password, please call the hospital operator.  03/10/2020, 8:49 AM
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
## 2020-03-10 NOTE — Progress Notes (Signed)
[facility] Progress Note  Subjective: UOP improving, 1100 yest and 1700 today so far  Vitals:   03/10/20 0200 03/10/20 0300 03/10/20 0400 03/10/20 0500  BP: (!) 115/50 121/73 (!) 126/54 (!) 149/68  Pulse: 65 77 62 84  Resp: 11 12 10  (!) 9  Temp: 98.8 F (37.1 C) 98.6 F (37 C) 98.8 F (37.1 C) 98.6 F (37 C)  TempSrc:      SpO2: 92% 93% 93% (!) 87%  Weight:      Height:        Exam: Gen elderly WF, confused  No jvd or bruits Chest clear bilat to bases RRR no MRG  Abd soft ntnd no mass or ascites +bs GU foley cath w/ good amts of urine Ext no edema Neuro is awake, follows simple commands, disoriented and confused    Home meds:  - norvasc 10/ plavix 75/ HCTZ 25 / metoprolol 25 bid/ crestor 25 qd/ asa 81  - metformin 1gm qam  - prn's/ vitamins/ supplements   UA 0-5 rbc/ wbc, rare bact, 100 prot, small Hb, hazy   UNa 82, UCr 58 CXR - no acute disease CT abd/ pelv wo contrast > Adrenals/Urinary Tract: The adrenal glands and kidneys are grossly normal. No renal, ureteral or bladder calculi or obvious mass without contrast. There is a Foley catheter noted in the bladder.   Renal - 10.5/ 10.2 cm kidneys, ^d echo, no hydro  Assessment/ Plan: 1. Acute renal failure - normal creat in June 2021. AKI suspect pre-renal vs ATN. +N/V at home and may have had septic picture as well w/ low temp /  ^WBC. Urine lytes c/w ATN. Kidneys appear wnl on CT abd and UA negative. Very good UOP now, creat down but very slow decline, would like to see it pick up. Only potential uremic symptom is confusion, but no asterixis so will cont to hold off on w/ RRT and watch for further renal improvement. Have d/w the son and questions answered.  2. Hypokalemia - changing IVF"s and give po Kdur supplementation 3. [facility] - r/o sepsis. UA and CXR unremarkable, cx's negative, on empiric IV abx 4. HTN - holding home BP meds , have dc'd metoprolol for now 5. DM2 - on oral agents at home.   Per pmd.  6. H/o CVA - details unknown     Rob Jadore Mcguffin 03/10/2020, 9:15 AM   Recent Labs  Lab 03/09/20 0233 03/10/20 0229  K 3.7 2.8*  BUN 122* 143*  CREATININE 8.37* 8.17*  8.05*  CALCIUM 7.2* 6.5*  PHOS  --  6.5*  HGB 11.2* 10.0*   Inpatient medications: . chlorhexidine  15 mL Mouth Rinse BID  . Chlorhexidine Gluconate Cloth  6 each Topical Daily  . insulin aspart  0-6 Units Subcutaneous TID WC  . mouth rinse  15 mL Mouth Rinse BID  . mouth rinse  15 mL Mouth Rinse q12n4p  . [START ON 03/12/2020] pantoprazole  40 mg Intravenous Q12H   . sodium chloride 10 mL/hr at 03/09/20 1330  . ceFEPime (MAXIPIME) IV Stopped (03/09/20 1305)  . lactated ringers 125 mL/hr at 03/10/20 0735  . pantoprozole (PROTONIX) infusion 8 mg/hr (03/10/20 0738)  . potassium chloride 10 mEq (03/10/20 0853)   sodium chloride, acetaminophen **OR** acetaminophen, ondansetron (ZOFRAN) IV, polyethylene glycol, Resource ThickenUp Clear

## 2020-03-10 NOTE — Progress Notes (Signed)
CRITICAL VALUE ALERT  Critical Value: Calcium 6.4  Date & Time Notied:  03/10/2020 1640  Provider Notified: Dr. Lowell Guitar  Orders Received/Actions taken: Orders received

## 2020-03-11 ENCOUNTER — Inpatient Hospital Stay (HOSPITAL_COMMUNITY): Payer: Medicare Other

## 2020-03-11 LAB — COMPREHENSIVE METABOLIC PANEL
ALT: 19 U/L (ref 0–44)
AST: 33 U/L (ref 15–41)
Albumin: 2.8 g/dL — ABNORMAL LOW (ref 3.5–5.0)
Alkaline Phosphatase: 50 U/L (ref 38–126)
Anion gap: 19 — ABNORMAL HIGH (ref 5–15)
BUN: 112 mg/dL — ABNORMAL HIGH (ref 8–23)
CO2: 32 mmol/L (ref 22–32)
Calcium: 6.7 mg/dL — ABNORMAL LOW (ref 8.9–10.3)
Chloride: 86 mmol/L — ABNORMAL LOW (ref 98–111)
Creatinine, Ser: 7.72 mg/dL — ABNORMAL HIGH (ref 0.44–1.00)
GFR calc Af Amer: 5 mL/min — ABNORMAL LOW (ref >60)
GFR calc non Af Amer: 5 mL/min — ABNORMAL LOW (ref >60)
Glucose, Bld: 152 mg/dL — ABNORMAL HIGH (ref 70–99)
Potassium: 3.4 mmol/L — ABNORMAL LOW (ref 3.5–5.1)
Sodium: 137 mmol/L (ref 135–145)
Total Bilirubin: 0.9 mg/dL (ref 0.3–1.2)
Total Protein: 5.4 g/dL — ABNORMAL LOW (ref 6.5–8.1)

## 2020-03-11 LAB — GLUCOSE, CAPILLARY
Glucose-Capillary: 143 mg/dL — ABNORMAL HIGH (ref 70–99)
Glucose-Capillary: 140 mg/dL — ABNORMAL HIGH (ref 70–99)
Glucose-Capillary: 153 mg/dL — ABNORMAL HIGH (ref 70–99)
Glucose-Capillary: 169 mg/dL — ABNORMAL HIGH (ref 70–99)
Glucose-Capillary: 149 mg/dL — ABNORMAL HIGH (ref 70–99)

## 2020-03-11 LAB — CBC WITH DIFFERENTIAL/PLATELET
Abs Immature Granulocytes: 0.17 10*3/uL — ABNORMAL HIGH (ref 0.00–0.07)
Basophils Absolute: 0 10*3/uL (ref 0.0–0.1)
Basophils Relative: 0 %
Eosinophils Absolute: 0.1 10*3/uL (ref 0.0–0.5)
Eosinophils Relative: 1 %
HCT: 31.2 % — ABNORMAL LOW (ref 36.0–46.0)
Hemoglobin: 10.5 g/dL — ABNORMAL LOW (ref 12.0–15.0)
Immature Granulocytes: 2 %
Lymphocytes Relative: 13 %
Lymphs Abs: 1.5 10*3/uL (ref 0.7–4.0)
MCH: 28.8 pg (ref 26.0–34.0)
MCHC: 33.7 g/dL (ref 30.0–36.0)
MCV: 85.5 fL (ref 80.0–100.0)
Monocytes Absolute: 0.9 10*3/uL (ref 0.1–1.0)
Monocytes Relative: 8 %
Neutro Abs: 8.8 10*3/uL — ABNORMAL HIGH (ref 1.7–7.7)
Neutrophils Relative %: 76 %
Platelets: 180 10*3/uL (ref 150–400)
RBC: 3.65 MIL/uL — ABNORMAL LOW (ref 3.87–5.11)
RDW: 13.3 % (ref 11.5–15.5)
WBC: 11.5 10*3/uL — ABNORMAL HIGH (ref 4.0–10.5)
nRBC: 0 % (ref 0.0–0.2)

## 2020-03-11 LAB — BASIC METABOLIC PANEL
Anion gap: 23 — ABNORMAL HIGH (ref 5–15)
BUN: 124 mg/dL — ABNORMAL HIGH (ref 8–23)
CO2: 32 mmol/L (ref 22–32)
Calcium: 6.4 mg/dL — CL (ref 8.9–10.3)
Chloride: 84 mmol/L — ABNORMAL LOW (ref 98–111)
Creatinine, Ser: 7.75 mg/dL — ABNORMAL HIGH (ref 0.44–1.00)
GFR calc Af Amer: 5 mL/min — ABNORMAL LOW (ref >60)
GFR calc non Af Amer: 5 mL/min — ABNORMAL LOW (ref >60)
Glucose, Bld: 156 mg/dL — ABNORMAL HIGH (ref 70–99)
Potassium: 3.4 mmol/L — ABNORMAL LOW (ref 3.5–5.1)
Sodium: 139 mmol/L (ref 135–145)

## 2020-03-11 LAB — MAGNESIUM: Magnesium: 1.5 mg/dL — ABNORMAL LOW (ref 1.7–2.4)

## 2020-03-11 LAB — PHOSPHORUS: Phosphorus: 4.2 mg/dL (ref 2.5–4.6)

## 2020-03-11 IMAGING — DX DG ABDOMEN 1V
1 series · 1 of 1 positions shown · non-contrast
Comparison: [DATE]

CLINICAL DATA: NG tube placement

EXAM:
ABDOMEN - 1 VIEW

[abdomen kub]
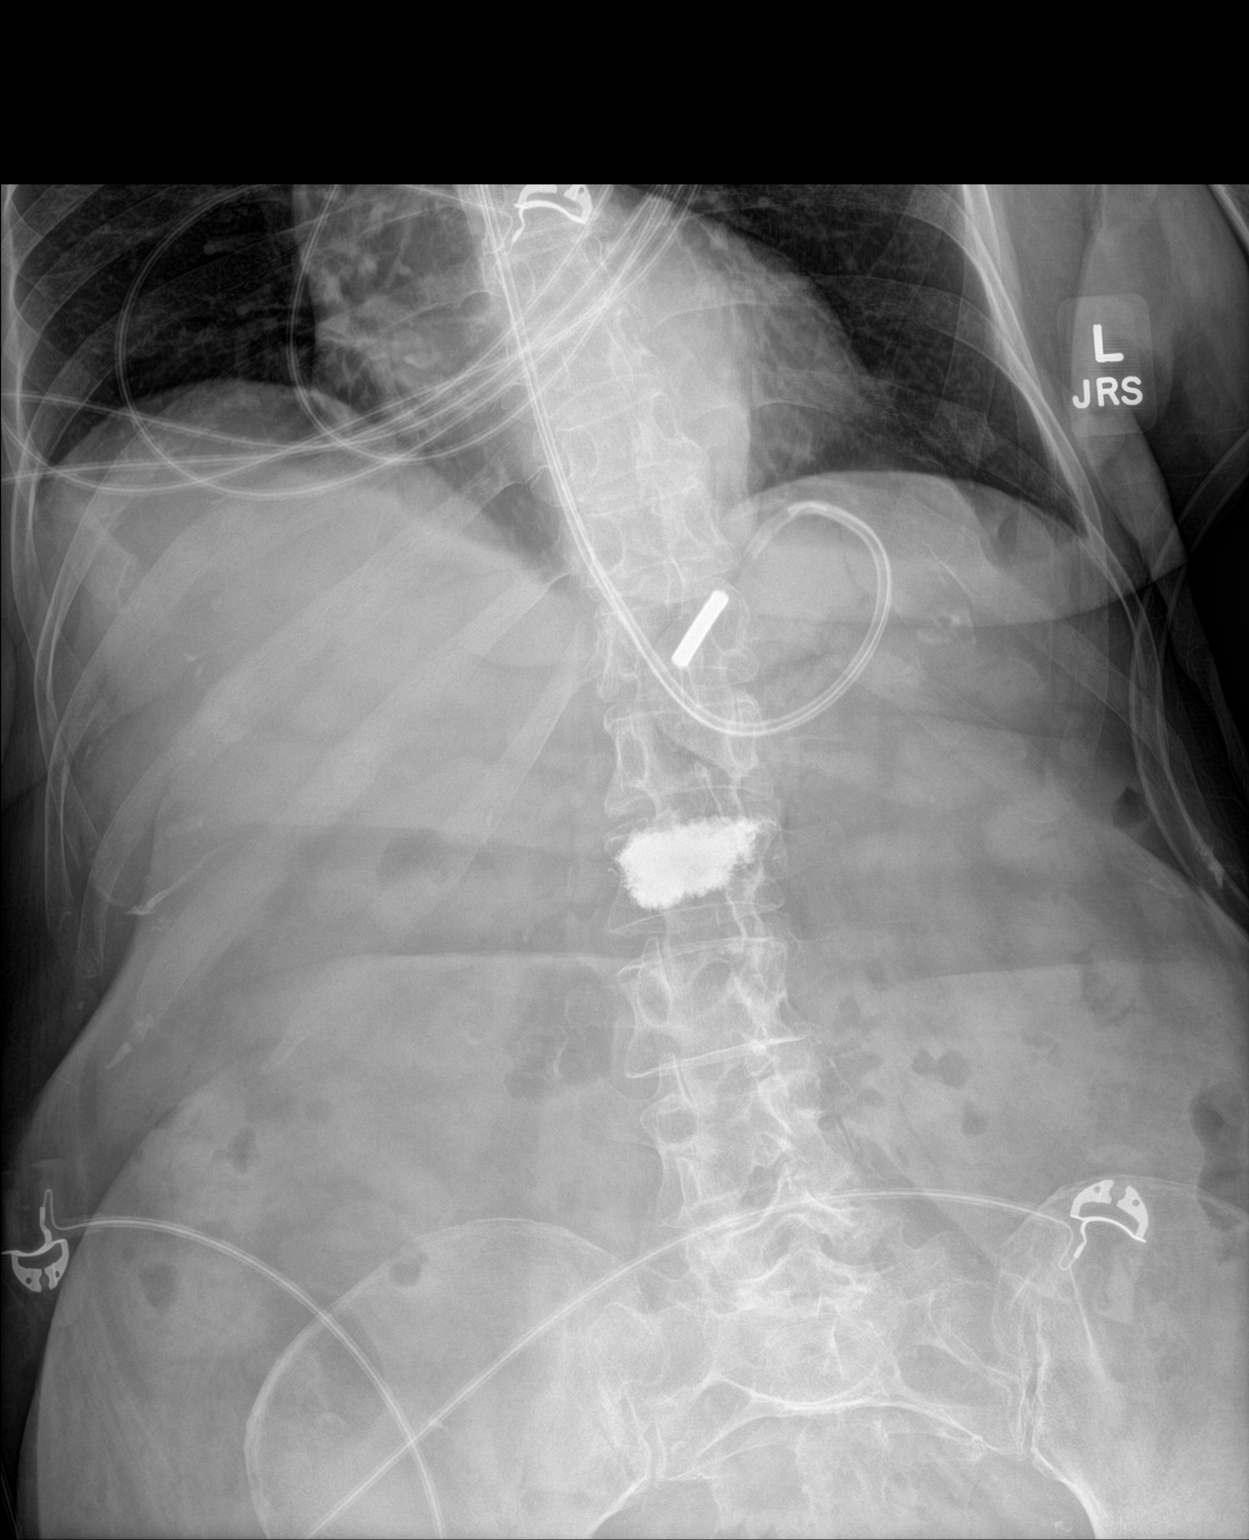

[1 of 1 positions shown; findings below may reference images not displayed]

FINDINGS: The enteric tube projects over the gastric body/fundus. The tube is
coiled within the gastric body. The bowel gas pattern is
nonobstructive. The patient is status post prior vertebral
augmentation of the L2 vertebral body.
IMPRESSION: NG tube projects over the gastric body/fundus.

## 2020-03-11 IMAGING — DX DG ABDOMEN 1V
1 series · 1 of 1 positions shown · non-contrast
Comparison: Abdominal CT [DATE]

CLINICAL DATA: Post NG tube placement.

EXAM:
ABDOMEN - 1 VIEW

[abdomen kub]
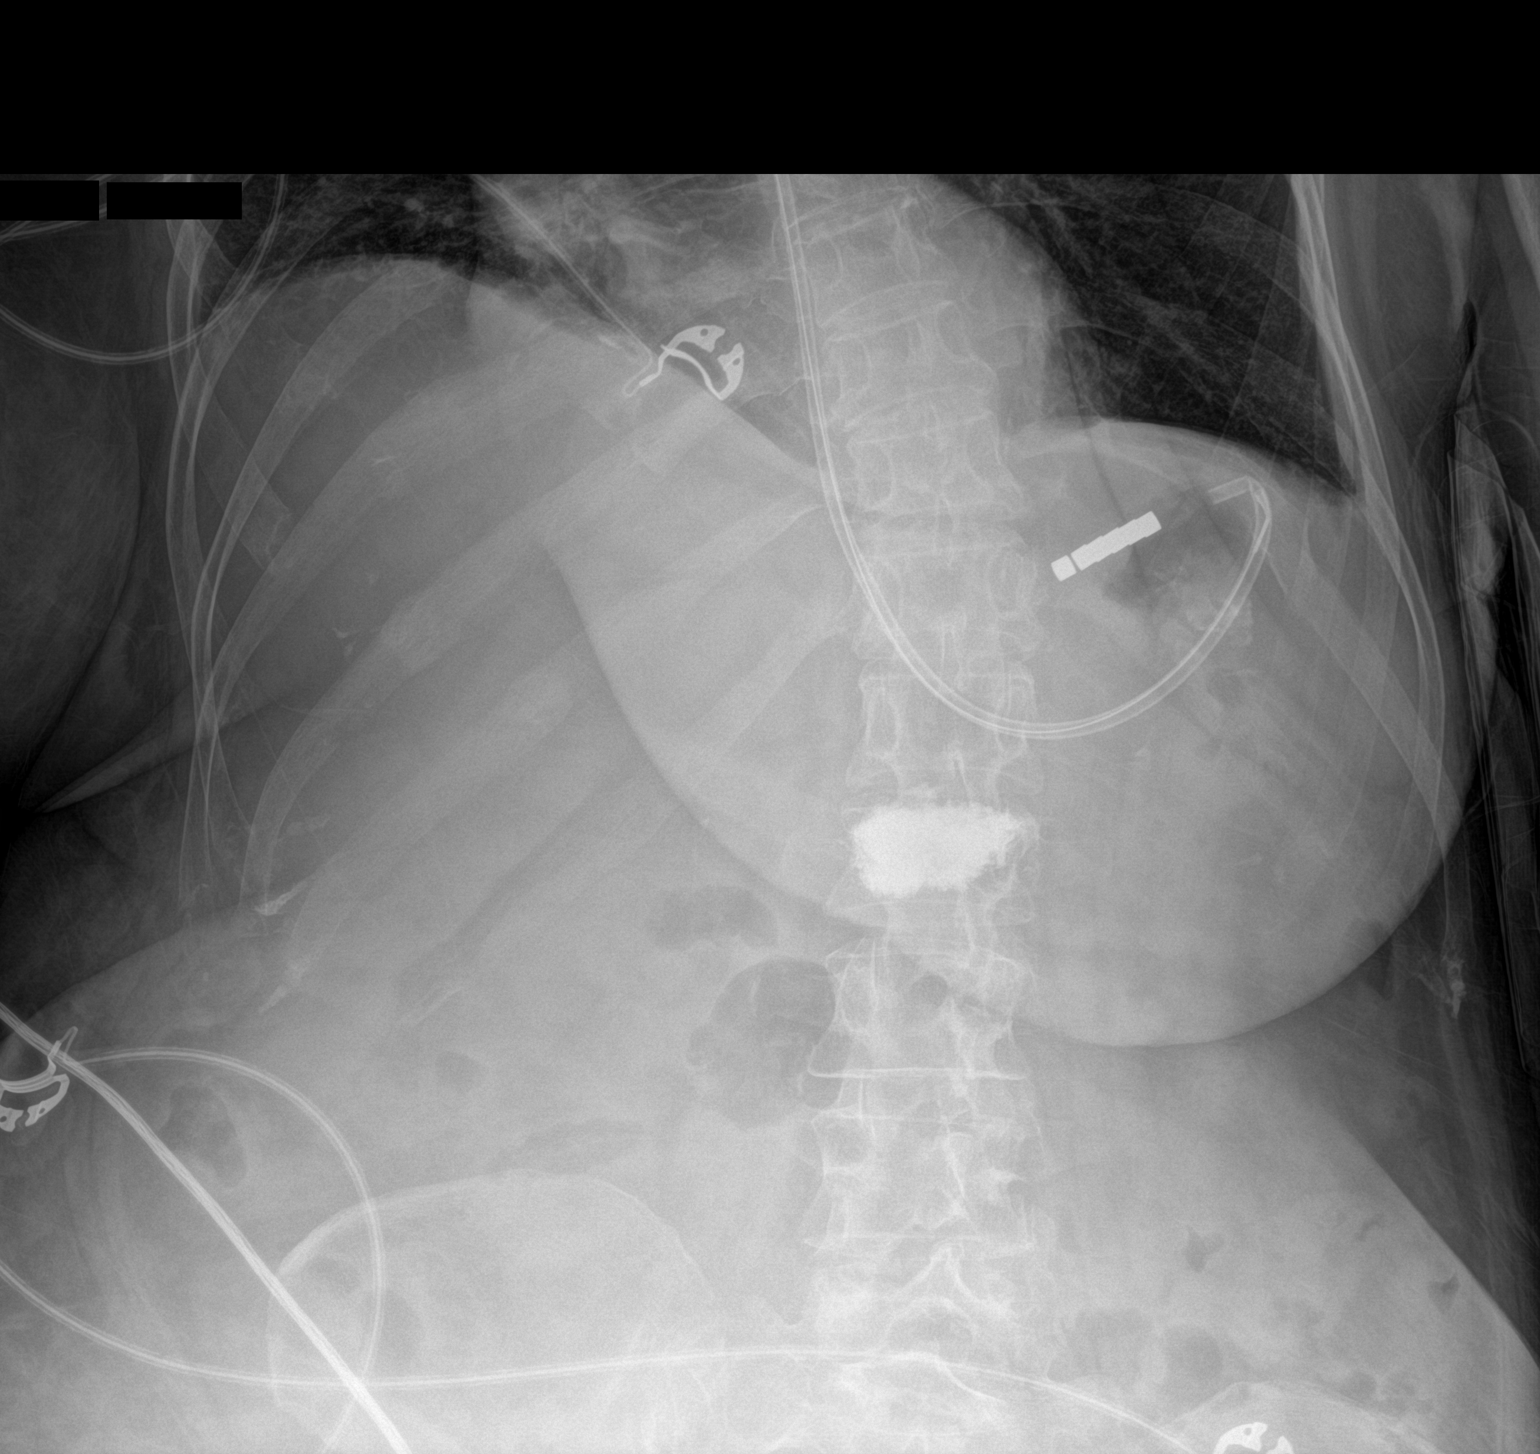

[1 of 1 positions shown; findings below may reference images not displayed]

FINDINGS: Weighted enteric tube with tip below the diaphragm in the stomach,
located in the region of the gastric cardia. The stylet is still in
place. Tip of the feeding tube is not post pyloric. Nonobstructive
bowel gas pattern in the included abdomen. Included lung bases are
clear.
IMPRESSION: Weighted enteric tube with tip below the diaphragm in the stomach,
located in the region of the gastric cardia.

## 2020-03-11 MED ORDER — METOPROLOL TARTRATE 25 MG PO TABS
25.0000 mg | ORAL_TABLET | Freq: Two times a day (BID) | ORAL | Status: DC
  Administered 2020-03-11 – 2020-03-14 (×7): 25 mg via ORAL
  Filled 2020-03-11 (×7): qty 1

## 2020-03-11 MED ORDER — MAGNESIUM SULFATE IN D5W 1-5 GM/100ML-% IV SOLN
1.0000 g | Freq: Once | INTRAVENOUS | Status: AC
  Administered 2020-03-11: 1 g via INTRAVENOUS
  Filled 2020-03-11: qty 100

## 2020-03-11 MED ORDER — HYDRALAZINE HCL 20 MG/ML IJ SOLN
5.0000 mg | Freq: Once | INTRAMUSCULAR | Status: AC | PRN
  Administered 2020-03-11: 5 mg via INTRAVENOUS
  Filled 2020-03-11: qty 1

## 2020-03-11 MED ORDER — POTASSIUM CHLORIDE 2 MEQ/ML IV SOLN
INTRAVENOUS | Status: AC
  Filled 2020-03-11 (×3): qty 1000

## 2020-03-11 MED ORDER — MAGNESIUM SULFATE 2 GM/50ML IV SOLN
2.0000 g | Freq: Once | INTRAVENOUS | Status: AC
  Administered 2020-03-11: 2 g via INTRAVENOUS
  Filled 2020-03-11: qty 50

## 2020-03-11 MED ORDER — HYDRALAZINE HCL 25 MG PO TABS: 25.0000 mg | ORAL_TABLET | Freq: Four times a day (QID) | ORAL | Status: DC | PRN

## 2020-03-11 MED ORDER — CALCIUM GLUCONATE-NACL 1-0.675 GM/50ML-% IV SOLN
1.0000 g | Freq: Once | INTRAVENOUS | Status: AC
  Administered 2020-03-11: 1000 mg via INTRAVENOUS
  Filled 2020-03-11: qty 50

## 2020-03-11 MED ORDER — HYDRALAZINE HCL 10 MG PO TABS
10.0000 mg | ORAL_TABLET | Freq: Four times a day (QID) | ORAL | Status: DC | PRN
  Administered 2020-03-11: 10 mg via ORAL
  Filled 2020-03-11: qty 1

## 2020-03-11 MED ORDER — POTASSIUM CHLORIDE 10 MEQ/100ML IV SOLN
10.0000 meq | Freq: Once | INTRAVENOUS | Status: AC
  Administered 2020-03-11: 10 meq via INTRAVENOUS
  Filled 2020-03-11: qty 100

## 2020-03-11 MED ORDER — AMLODIPINE BESYLATE 10 MG PO TABS
10.0000 mg | ORAL_TABLET | Freq: Every day | ORAL | Status: DC
  Administered 2020-03-11 – 2020-03-14 (×4): 10 mg via ORAL
  Filled 2020-03-11 (×4): qty 1

## 2020-03-11 NOTE — Plan of Care (Signed)

## 2020-03-11 NOTE — Progress Notes (Addendum)
PROGRESS NOTE    Tasha [name]  ZOX:[id number] DOB: 06/23/1946 DOA: 03/08/2020 PCP: Patient, No Pcp Per   Chief Complaint  Patient presents with  . Emesis    Brief Narrative Tasha SallesBetsy Manleyis Tasha [name] 74 y.o.femalewith medical history significant ofstroke, T2DM, HTN, MI who was recently discharged 01/2020 after Tasha [name] stroke workup who represented on day of admission with nausea/vomiting.   History was limited due to the patient's mental status. She says "I don't know" when asked why she is here. Hx obtained from pt, staff, [facility], and chart. 2 days of nausea and vomiting which has progressively gotten worse. Staff reports progression to brown emesis. She denies fevers, chest pain. Endorses nausea, vomiting, and abdominal pain. Her son says at baseline, she's alert and oriented.   ED Course:Noted to have significant lactic acidosis and AKI. IVF, labs, EKG, imaging, antibiotics. Hospitalist to admit for aki/lactic acidosis and possible sepsis. Nephrology was c/s by the ED.   Patient admitted, placed empirically on IV antibiotics, pancultured, placed on aggressive IV fluid resuscitation with bicarb.  Nephrology consulted.  The evening of admission 03/08/2020 patient noted to have some coffee-ground emesis.  Hemoccult negative.  Patient placed on Sarrinah Gardin Protonix drip.  Assessment & Plan:   Active Problems:   CVA (cerebral vascular accident) (HCC)   DM (diabetes mellitus), type 2 with peripheral vascular complications (HCC)   Essential (primary) hypertension   Lactic acidosis   Nausea and vomiting   AKI (acute kidney injury) (HCC)   Hyperkalemia   Sepsis (HCC)   Coffee ground emesis  Acute Kidney Injury Unclear etiology, suspected prerenal - possibly related to sepsis, ? Volume/hemodynamically mediated with nausea/vomiting on presentation. Renal US without hydro, CT without hydro Creatinine with modest improvement - 7.72 today Adequate UOP, foley d/c'd 7/14 Continue IVF Renal c/s,  appreciate recs   Concern for Sepsis  Leukocytosis  Hypothermia: hypothermia, leukocytosis, lactic acidosis at presentation, concerning for sepsis No clear source identified  CXR with atelectasis on 7/13 CT abd/pelvis without acute findings, RUQ US without evidence of cholecystitis UA not suggestive of UTI - urine cx no growth Blood cx NGTD x2, MRSA PCR negative Leukocytosis improving Vanc/cefepime -> will deescalate to ceftriaxone   Acute Metabolic Encephalopathy 2/2 above, uremia, possible infection  Renal following for uremia with aki, renal function slowly improving Continue abx for possible infection  TSH wnl, B12 wnl, folate wnl Head CT with L posterior internal capsule infarct, no new areas of infarction Delirium precautions   AGMA  Lactic Acidosis: bicarb 32 today, continue IVF  Nausea  Vomiting  Coffee Ground Emesis:  Coffee ground emesis noted on day of admission around 1949 (see nursing note) She's been started on PPI gtt - hemoccult was negative - ? Mallory weiss Hemoglobin downtrending, but suspect 2/2 dilution  Consider GI consultation if recurrent or significantly downtrending Hb Hold plavix and DVT ppx Elevated lipase, follow repeat  Dysphagia: nectar thick liquid per speech - today she's not taking PO safely - may need to consider NG tube and tube feeds if this is persistent  Hyperkalemia  Hypokalemia: now hypokalemic -> replace conservatively with AKI  Hx CVA:  Patient with recent stroke discharged on 02/19/2020 was supposed to be on dual antiplatelet therapy x3 weeks then Plavix alone.  Patient noted to need Kamyah Wilhelmsen repeat MRI in 3 months as well as 6-week follow-up with neurology.  Patient does have Tasha [name] chronic persistent right-sided weakness.  Statin on hold.  Plavix on hold. PT/OT  T2DM:  A1c 6.9 01/2020  SSI  Hypertension:  Holding norvasc, HCTZ, metoprolol  Elevated troponin: mild x1, in setting of AKI - not clear to me why this was ordered last night.   No notes suggesting acute change in status.  Will defer checking additional troponins unless chest pain or other concerning symptoms.  DVT prophylaxis: SCD Code Status: full  Family Communication: none at bedside - called son, no answer Disposition:   Status is: Inpatient  Remains inpatient appropriate because:Inpatient level of care appropriate due to severity of illness   Dispo: The patient is from: SNF              Anticipated d/c is to: pending              Anticipated d/c date is: > 3 days              Patient currently is not medically stable to d/c.   Consultants:   renal  Procedures:   none  Antimicrobials: Anti-infectives (From admission, onward)   Start     Dose/Rate Route Frequency Ordered Stop   03/10/20 1200  cefTRIAXone (ROCEPHIN) 2 g in sodium chloride 0.9 % 100 mL IVPB     Discontinue     2 g 200 mL/hr over 30 Minutes Intravenous Every 24 hours 03/10/20 0924     03/09/20 1000  ceFEPIme (MAXIPIME) 1 g in sodium chloride 0.9 % 100 mL IVPB  Status:  Discontinued        1 g 200 mL/hr over 30 Minutes Intravenous Every 24 hours 03/08/20 1219 03/10/20 0924   03/08/20 1219  vancomycin variable dose per unstable renal function (pharmacist dosing)  Status:  Discontinued         Does not apply See admin instructions 03/08/20 1219 03/10/20 0906   03/08/20 1100  vancomycin (VANCOCIN) IVPB 1000 mg/200 mL premix        1,000 mg 200 mL/hr over 60 Minutes Intravenous  Once 03/08/20 1046 03/08/20 1406   03/08/20 1100  ceFEPIme (MAXIPIME) 2 g in sodium chloride 0.9 % 100 mL IVPB        2 g 200 mL/hr over 30 Minutes Intravenous  Once 03/08/20 1046 03/08/20 1211     Subjective: Tasha [name]&Ox1 Slow, delayed responses No c/o pain  Objective: Vitals:   03/11/20 0439 03/11/20 0500 03/11/20 0600 03/11/20 0700  BP:  (!) 168/78 (!) 131/50 (!) 157/56  Pulse:  70 63 62  Resp:  Temp:      TempSrc:      SpO2:  98% 95% 95%  Weight: 66.3 kg     Height:         Intake/Output Summary (Last 24 hours) at 03/11/2020 0824 Last data filed at 03/11/2020 1610 Gross per 24 hour  Intake 2929.72 ml  Output 1801 ml  Net 1128.72 ml   Filed Weights   03/09/20 0409 03/11/20 0439  Weight: 62.2 kg 66.3 kg    Examination:  General: No acute distress. Cardiovascular: Heart sounds show Adileny Delon regular rate, and rhythm Lungs: Clear to auscultation bilaterally, unlabored Abdomen: Soft, nontender, nondistended Neurological: Alert and oriented 1.  Right sided weakness. Follows instructions inconsistently. Skin: Warm and dry. No rashes or lesions. Extremities: No clubbing or cyanosis. No edema.   Data Reviewed: I have personally reviewed following labs and imaging studies  CBC: Recent Labs  Lab 03/08/20 0858 03/08/20 1401 03/09/20 0233 03/10/20 0229 03/11/20 0252  WBC 20.5* 22.3* 17.1* 11.6* 11.5*  NEUTROABS 16.7*  --   --  8.9* 8.8*  HGB 13.9 13.4 11.2* 10.0* 10.5*  HCT 43.8 41.2 32.3* 28.9* 31.2*  MCV 92.4 89.6 84.1 82.6 85.5  PLT 316 394 271 201 180    Basic Metabolic Panel: Recent Labs  Lab 03/09/20 0233 03/10/20 0229 03/10/20 1547 03/10/20 1658 03/10/20 2319 03/11/20 0252  NA 139 140 141  --  139 137  K 3.7 2.8* 3.4*  --  3.4* 3.4*  CL 91* 81* 82*  --  84* 86*  CO2 21* 31 35*  --  32 32  GLUCOSE 97 150* 135*  --  156* 152*  BUN 122* 143* 130*  --  124* 112*  CREATININE 8.37* 8.17*  8.05* 7.99*  --  7.75* 7.72*  CALCIUM 7.2* 6.5* 6.4*  --  6.4* 6.7*  MG  --   --   --  1.5* 1.6* 1.5*  PHOS  --  6.5*  --   --   --  4.2    GFR: Estimated Creatinine Clearance: 6.1 mL/min (Seba Madole) (by C-G formula based on SCr of 7.72 mg/dL (H)).  Liver Function Tests: Recent Labs  Lab 03/08/20 0858 03/09/20 0233 03/10/20 0229 03/10/20 2319 03/11/20 0252  AST 27 17  --  32 33  ALT 20 18  --  19 19  ALKPHOS 60 47  --  50 50  BILITOT 0.8 0.8  --  1.2 0.9  PROT 7.7 5.7*  --  5.5* 5.4*  ALBUMIN 4.2 3.2* 2.8* 2.8* 2.8*    CBG: Recent Labs  Lab  03/10/20 1547 03/10/20 1923 03/10/20 2313 03/11/20 0410 03/11/20 0756  GLUCAP 132* 133* 150* 143* 140*     Recent Results (from the past 240 hour(s))  SARS Coronavirus 2 by RT PCR (hospital order, performed in [facility] hospital lab) Nasopharyngeal Nasopharyngeal Swab     Status: None   Collection Time: 03/08/20  9:00 AM   Specimen: Nasopharyngeal Swab  Result Value Ref Range Status   SARS Coronavirus 2 NEGATIVE NEGATIVE Final    Comment: (NOTE) SARS-CoV-2 target nucleic acids are NOT DETECTED.  The SARS-CoV-2 RNA is generally detectable in upper and lower respiratory specimens during the acute phase of infection. The lowest concentration of SARS-CoV-2 viral copies this assay can detect is 250 copies / mL. Joal Eakle negative result does not preclude SARS-CoV-2 infection and should not be used as the sole basis for treatment or other patient management decisions.  Maddax Palinkas negative result may occur with improper specimen collection / handling, submission of specimen other than nasopharyngeal swab, presence of viral mutation(s) within the areas targeted by this assay, and inadequate number of viral copies (<250 copies / mL). Oney Folz negative result must be combined with clinical observations, patient history, and epidemiological information.  Fact Sheet for Patients:   BoilerBrush.com.cy  Fact Sheet for Healthcare Providers: https://pope.com/  This test is not yet approved or  cleared by the [place] FDA and has been authorized for detection and/or diagnosis of SARS-CoV-2 by FDA under an Emergency Use Authorization (EUA).  This EUA will remain in effect (meaning this test can be used) for the duration of the COVID-19 declaration under Section 564(b)(1) of the Act, 21 U.S.C. section 360bbb-3(b)(1), unless the authorization is terminated or revoked sooner.  Performed at [facility] [place] [facility], [address], [place], [place]  [address]   Culture, Urine     Status: None   Collection Time: 03/08/20 10:20 AM   Specimen: Urine, Random  Result Value Ref Range Status   Specimen Description  Final    URINE, RANDOM Performed at [facility], [address], [place], [place] [address]    Special Requests   Final    NONE Performed at [facility], [address] [place], [place] [address]    Culture   Final    NO GROWTH Performed at [facility], [address] [place], [place] [address]    Report Status 03/10/2020 FINAL  Final  Blood culture (routine x 2)     Status: None (Preliminary result)   Collection Time: 03/08/20 11:45 AM   Specimen: BLOOD  Result Value Ref Range Status   Specimen Description   Final    BLOOD RIGHT ANTECUBITAL Performed at [facility], [address] [place], [place] [address]    Special Requests   Final    BOTTLES DRAWN AEROBIC AND ANAEROBIC Blood Culture adequate volume Performed at [facility], [address] [place], [place] [address]    Culture   Final    NO GROWTH 2 DAYS Performed at [facility], [address] [place], [place] [address]    Report Status PENDING  Incomplete  Blood culture (routine x 2)     Status: None (Preliminary result)   Collection Time: 03/08/20 12:05 PM   Specimen: BLOOD  Result Value Ref Range Status   Specimen Description   Final    BLOOD LEFT WRIST Performed at [facility], [address] [place], [place] [address]    Special Requests   Final    BOTTLES DRAWN AEROBIC AND ANAEROBIC Blood Culture adequate volume Performed at [facility], [address], [place], [place] [address]    Culture   Final    NO GROWTH 2 DAYS Performed at [facility], [address], [place], [place] [address]    Report Status PENDING  Incomplete  MRSA PCR Screening     Status: None   Collection Time: 03/08/20  1:47 PM    Specimen: Nasopharyngeal  Result Value Ref Range Status   MRSA by PCR NEGATIVE NEGATIVE Final    Comment:        The GeneXpert MRSA Assay (FDA approved for NASAL specimens only), is one component of Malea Swilling comprehensive MRSA colonization surveillance program. It is not intended to diagnose MRSA infection nor to guide or monitor treatment for MRSA infections. Performed at [facility], [address], [place], [place] [address]          Radiology Studies: No results found.      Scheduled Meds: . chlorhexidine  15 mL Mouth Rinse BID  . Chlorhexidine Gluconate Cloth  6 each Topical Daily  . insulin aspart  0-6 Units Subcutaneous TID WC  . mouth rinse  15 mL Mouth Rinse BID  . mouth rinse  15 mL Mouth Rinse q12n4p  . [START ON 03/12/2020] pantoprazole  40 mg Intravenous Q12H   Continuous Infusions: . sodium chloride 250 mL (03/10/20 2211)  . cefTRIAXone (ROCEPHIN)  IV Stopped (03/10/20 1310)  . magnesium sulfate bolus IVPB 1 g (03/11/20 0800)  . pantoprozole (PROTONIX) infusion 8 mg/hr (03/11/20 0272)  . sodium chloride 0.45 % 1,000 mL with potassium chloride 40 mEq infusion 100 mL/hr at 03/11/20 0628     LOS: 3 days    Time spent: over 30 min    Lacretia Nicks, MD [facility]   To contact the attending provider between 7A-7P or the covering provider  during after hours 7P-7A, please log into the web site [email or web address] and access using universal [place] password for that web site. If you do not have the password, please call the hospital operator.  03/11/2020, 8:24 AM

## 2020-03-11 NOTE — Progress Notes (Addendum)
Called to on call Hospitalist: see new orders  Results for CHARMEL, PRONOVOST (MRN [id number]) as of 03/11/2020 00:11  Ref. Range 03/10/2020 23:19  BASIC METABOLIC PANEL Unknown Rpt (A)  Sodium Latest Ref Range: 135 - 145 mmol/L 139  Potassium Latest Ref Range: 3.5 - 5.1 mmol/L 3.4 (L)  Chloride Latest Ref Range: 98 - 111 mmol/L 84 (L)  CO2 Latest Ref Range: 22 - 32 mmol/L 32  Glucose Latest Ref Range: 70 - 99 mg/dL 361 (H)  BUN Latest Ref Range: 8 - 23 mg/dL 443 (H)  Creatinine Latest Ref Range: 0.44 - 1.00 mg/dL 1.54 (H)  Calcium Latest Ref Range: 8.9 - 10.3 mg/dL 6.4 (LL)  Anion gap Latest Ref Range: 5 - 15  23 (H)  Magnesium Latest Ref Range: 1.7 - 2.4 mg/dL 1.6 (L)  Alkaline Phosphatase Latest Ref Range: 38 - 126 U/L 50  Albumin Latest Ref Range: 3.5 - 5.0 g/dL 2.8 (L)  AST Latest Ref Range: 15 - 41 U/L 32  ALT Latest Ref Range: 0 - 44 U/L 19  Total Protein Latest Ref Range: 6.5 - 8.1 g/dL 5.5 (L)  Bilirubin, Direct Latest Ref Range: 0.0 - 0.2 mg/dL 0.1  Indirect Bilirubin Latest Ref Range: 0.3 - 0.9 mg/dL 1.1 (H)  Total Bilirubin Latest Ref Range: 0.3 - 1.2 mg/dL 1.2  GFR, Est Non African American Latest Ref Range: >60 mL/min 5 (L)  GFR, Est African American Latest Ref Range: >60 mL/min 5 (L)  Troponin I (High Sensitivity) Latest Ref Range: <18 ng/L 27 (H)

## 2020-03-11 NOTE — Progress Notes (Signed)
Unable to flush panda tube, second KUB showed curled in stomach, per charge RN, pulled tube back to 65cm and then flushed without difficulty.

## 2020-03-11 NOTE — Progress Notes (Addendum)
[facility] Progress Note  Subjective: 2.3 L UOP yest, pt remains confused. BUN 112 and creat 7.72 (down from 8.1)  Vitals:   03/11/20 0900 03/11/20 1000 03/11/20 1100 03/11/20 1200  BP: (!) 174/62 (!) 176/61 (!) 173/66 (!) 176/66  Pulse: 66 65 64 66  Resp: 12 13 13 13   Temp:      TempSrc:      SpO2: 99% 96% 98% 99%  Weight:      Height:        Exam: Gen elderly WF, confused  No jvd or bruits Chest clear bilat to bases RRR no MRG  Abd soft ntnd no mass or ascites +bs GU foley cath w/ good amts of urine Ext no edema Neuro is awake, follows simple commands, disoriented and confused    Home meds:  - norvasc 10/ plavix 75/ HCTZ 25 / metoprolol 25 bid/ crestor 25 qd/ asa 81  - metformin 1gm qam  - prn's/ vitamins/ supplements   UA 0-5 rbc/ wbc, rare bact, 100 prot, small Hb, hazy   UNa 82, UCr 58 CXR - no acute disease CT abd/ pelv wo contrast > Adrenals/Urinary Tract: The adrenal glands and kidneys are grossly normal. No renal, ureteral or bladder calculi or obvious mass without contrast. There is a Foley catheter noted in the bladder.   Renal - 10.5/ 10.2 cm kidneys, ^d echo, no hydro  Assessment/ Plan: 1. Acute renal failure - normal creat in June 2021. AKI suspect pre-renal vs ATN. +N/V at home and may have had septic picture as well w/ low temp /  ^WBC. Urine lytes c/w ATN. RUS shows no hydro and ^'d echotexture, UA negative. Good urine output, declining creat 7.7 this am. Cont supportive care. Will lower IVF rate.  No indication for RRT at this time. Will follow.  2. Hypokalemia - has been getting IV K+ runs. K 3.4 today which is better.  3. ^WBC - r/o sepsis. UA and CXR unremarkable, cx's negative, on empiric IV abx. WBC improved w/ IV abx. Vanc/ cefepime now changed to rocephin.  4. HTN - BP's on the high side, will lower IVF"s. Will resume metoprolol 25 bid as at home. If not able to take pills would use metoprolol 5mg  qid IV.  5. DM2 - on oral  agents at home.  Per pmd.  6. H/o CVA - details unknown      Rob Carla Whilden 03/11/2020, 12:42 PM   Recent Labs  Lab 03/10/20 0229 03/10/20 1547 03/10/20 2319 03/11/20 0252  K 2.8*   < > 3.4* 3.4*  BUN 143*   < > 124* 112*  CREATININE 8.17*  8.05*   < > 7.75* 7.72*  CALCIUM 6.5*   < > 6.4* 6.7*  PHOS 6.5*  --   --  4.2  HGB 10.0*  --   --  10.5*   < > = values in this interval not displayed.   Inpatient medications: . chlorhexidine  15 mL Mouth Rinse BID  . Chlorhexidine Gluconate Cloth  6 each Topical Daily  . insulin aspart  0-6 Units Subcutaneous TID WC  . mouth rinse  15 mL Mouth Rinse BID  . mouth rinse  15 mL Mouth Rinse q12n4p  . [START ON 03/12/2020] pantoprazole  40 mg Intravenous Q12H   . sodium chloride 250 mL (03/10/20 2211)  . cefTRIAXone (ROCEPHIN)  IV 2 g (03/11/20 1147)  . pantoprozole (PROTONIX) infusion 8 mg/hr (03/11/20 1200)  . sodium chloride 0.45 % 1,000  mL with potassium chloride 40 mEq infusion 100 mL/hr at 03/11/20 1200   sodium chloride, acetaminophen **OR** acetaminophen, ondansetron (ZOFRAN) IV, polyethylene glycol, Resource ThickenUp Clear

## 2020-03-12 ENCOUNTER — Inpatient Hospital Stay (HOSPITAL_COMMUNITY): Payer: Medicare Other

## 2020-03-12 LAB — GLUCOSE, CAPILLARY
Glucose-Capillary: 162 mg/dL — ABNORMAL HIGH (ref 70–99)
Glucose-Capillary: 172 mg/dL — ABNORMAL HIGH (ref 70–99)
Glucose-Capillary: 174 mg/dL — ABNORMAL HIGH (ref 70–99)
Glucose-Capillary: 194 mg/dL — ABNORMAL HIGH (ref 70–99)
Glucose-Capillary: 229 mg/dL — ABNORMAL HIGH (ref 70–99)
Glucose-Capillary: 273 mg/dL — ABNORMAL HIGH (ref 70–99)
Glucose-Capillary: 294 mg/dL — ABNORMAL HIGH (ref 70–99)

## 2020-03-12 LAB — CBC WITH DIFFERENTIAL/PLATELET
Abs Immature Granulocytes: 0.25 10*3/uL — ABNORMAL HIGH (ref 0.00–0.07)
Basophils Absolute: 0 10*3/uL (ref 0.0–0.1)
Basophils Relative: 0 %
Eosinophils Absolute: 0.3 10*3/uL (ref 0.0–0.5)
Eosinophils Relative: 2 %
HCT: 36 % (ref 36.0–46.0)
Hemoglobin: 12 g/dL (ref 12.0–15.0)
Immature Granulocytes: 2 %
Lymphocytes Relative: 11 %
Lymphs Abs: 1.4 10*3/uL (ref 0.7–4.0)
MCH: 28.8 pg (ref 26.0–34.0)
MCHC: 33.3 g/dL (ref 30.0–36.0)
MCV: 86.5 fL (ref 80.0–100.0)
Monocytes Absolute: 0.9 10*3/uL (ref 0.1–1.0)
Monocytes Relative: 7 %
Neutro Abs: 9.8 10*3/uL — ABNORMAL HIGH (ref 1.7–7.7)
Neutrophils Relative %: 78 %
Platelets: 158 10*3/uL (ref 150–400)
RBC: 4.16 MIL/uL (ref 3.87–5.11)
RDW: 13.4 % (ref 11.5–15.5)
WBC: 12.6 10*3/uL — ABNORMAL HIGH (ref 4.0–10.5)
nRBC: 0 % (ref 0.0–0.2)

## 2020-03-12 LAB — COMPREHENSIVE METABOLIC PANEL
ALT: 21 U/L (ref 0–44)
AST: 38 U/L (ref 15–41)
Albumin: 3 g/dL — ABNORMAL LOW (ref 3.5–5.0)
Alkaline Phosphatase: 61 U/L (ref 38–126)
Anion gap: 21 — ABNORMAL HIGH (ref 5–15)
BUN: 103 mg/dL — ABNORMAL HIGH (ref 8–23)
CO2: 27 mmol/L (ref 22–32)
Calcium: 7.5 mg/dL — ABNORMAL LOW (ref 8.9–10.3)
Chloride: 89 mmol/L — ABNORMAL LOW (ref 98–111)
Creatinine, Ser: 7.09 mg/dL — ABNORMAL HIGH (ref 0.44–1.00)
GFR calc Af Amer: 6 mL/min — ABNORMAL LOW (ref >60)
GFR calc non Af Amer: 5 mL/min — ABNORMAL LOW (ref >60)
Glucose, Bld: 163 mg/dL — ABNORMAL HIGH (ref 70–99)
Potassium: 4.2 mmol/L (ref 3.5–5.1)
Sodium: 137 mmol/L (ref 135–145)
Total Bilirubin: 0.9 mg/dL (ref 0.3–1.2)
Total Protein: 6.2 g/dL — ABNORMAL LOW (ref 6.5–8.1)

## 2020-03-12 LAB — MAGNESIUM: Magnesium: 2.6 mg/dL — ABNORMAL HIGH (ref 1.7–2.4)

## 2020-03-12 LAB — PHOSPHORUS: Phosphorus: 3.9 mg/dL (ref 2.5–4.6)

## 2020-03-12 IMAGING — DX DG ABDOMEN 1V
1 series · 1 of 1 positions shown · non-contrast
Comparison: Abdominal radiograph from one day prior.

CLINICAL DATA: Enteric tube placement

EXAM:
ABDOMEN - 1 VIEW

[abdomen kub]
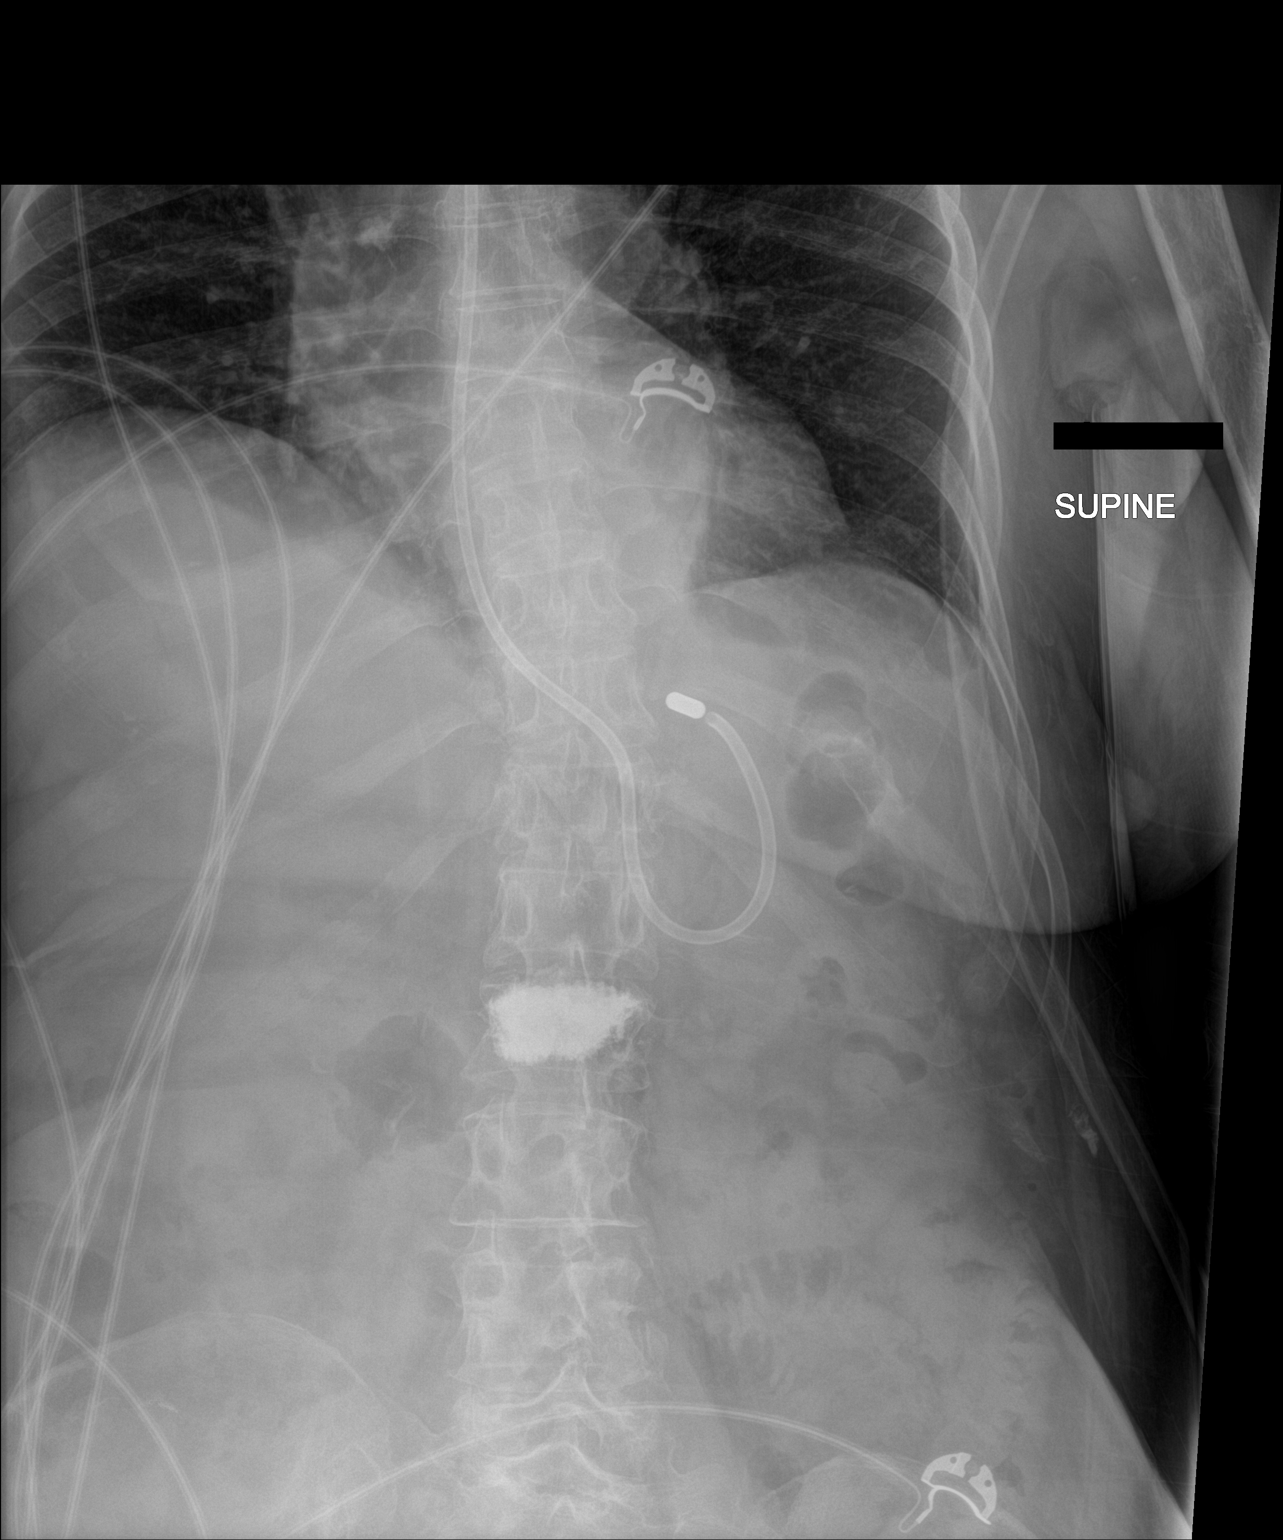

[1 of 1 positions shown; findings below may reference images not displayed]

FINDINGS: Weighted enteric tube terminates over the gastric fundus, oriented
superiorly towards the proximal stomach. No disproportionately
dilated small bowel loops. No evidence of pneumatosis or
pneumoperitoneum. Clear lung bases. Stable mild elevation of the
right hemidiaphragm. Vertebroplasty material overlies the L2
vertebral body.
IMPRESSION: Weighted enteric tube terminates over the gastric fundus, oriented
superiorly towards the proximal stomach. Per discussion with the
ordering provider, could pull back approximately 3-4 cm to attempt
to reorient the tip.

## 2020-03-12 IMAGING — RF DG INTRO LONG GI TUBE
2 series · 2 of 2 positions shown · IV contrast (omnipaque)
Comparison: Abdominal film of earlier in the day

CLINICAL DATA: Feeding tube advancement.

EXAM:
FL FEEDING TUBE PLACEMENT
CONTRAST:  Approximately 50 cc of Omnipaque
FLUOROSCOPY TIME:  Fluoroscopy Time:  4 minutes and 42 seconds
Radiation Exposure Index (if provided by the fluoroscopic device):
46.1 mGy
Number of Acquired Spot Images: 0

[Series 1: cp_standard · 0.26mm/px · 1 of 1 slices shown (1 of 2)]
[im 1/1]
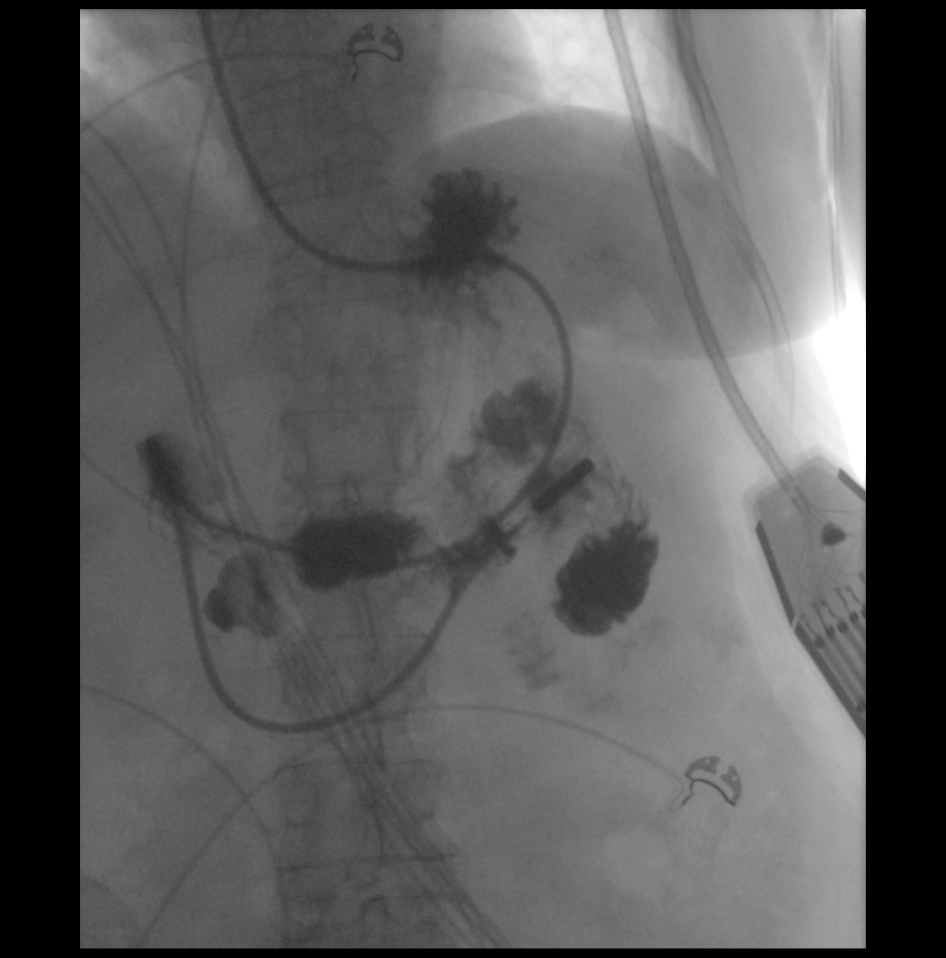

[Series 2: cp_standard · 0.26mm/px · 1 of 1 slices shown (2 of 2)]
[im 1/1]
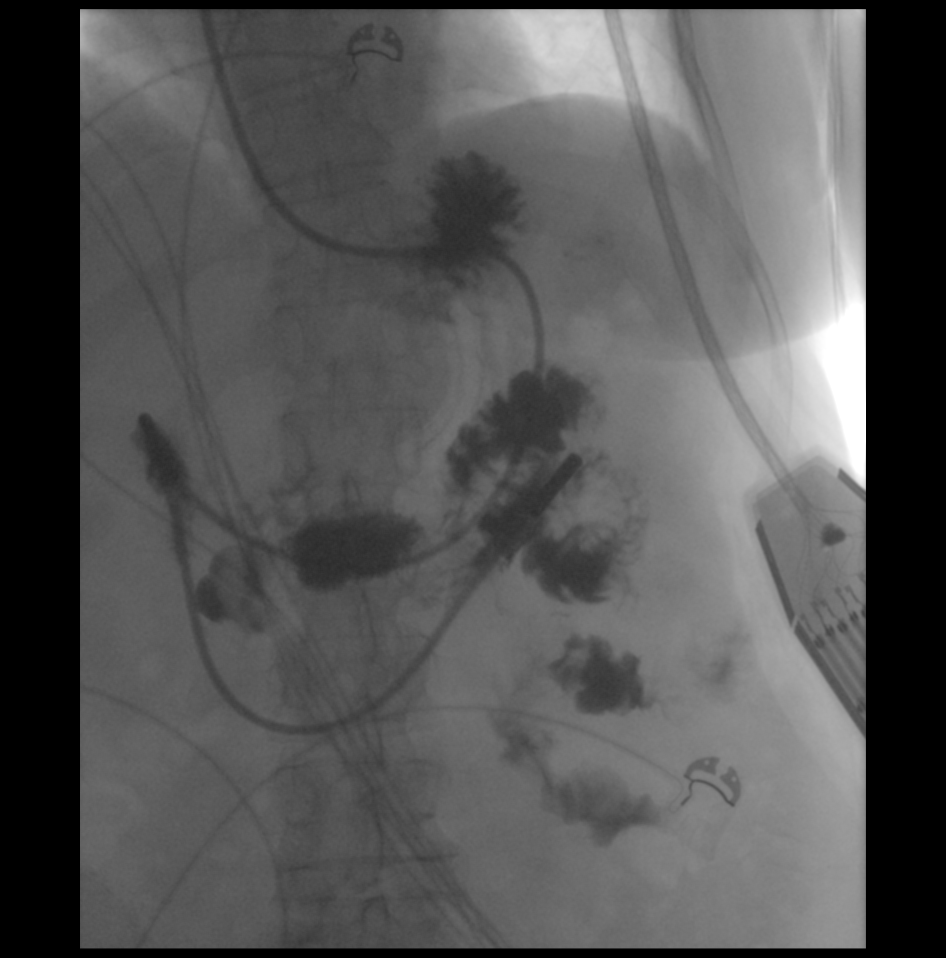

[2 of 2 positions shown; findings below may reference images not displayed]

FINDINGS: An Amplatz wire was placed in the feeding tube. The feeding tube was
advanced the level of the ligament of Treitz and location confirmed
with a small amount of contrast.
IMPRESSION: Feeding tube advancement into the distal duodenum.

## 2020-03-12 IMAGING — DX DG ABDOMEN 1V
1 series · 1 of 1 positions shown · non-contrast
Comparison: [DATE]

CLINICAL DATA: NG tube placement

EXAM:
ABDOMEN - 1 VIEW

[abdomen kub]
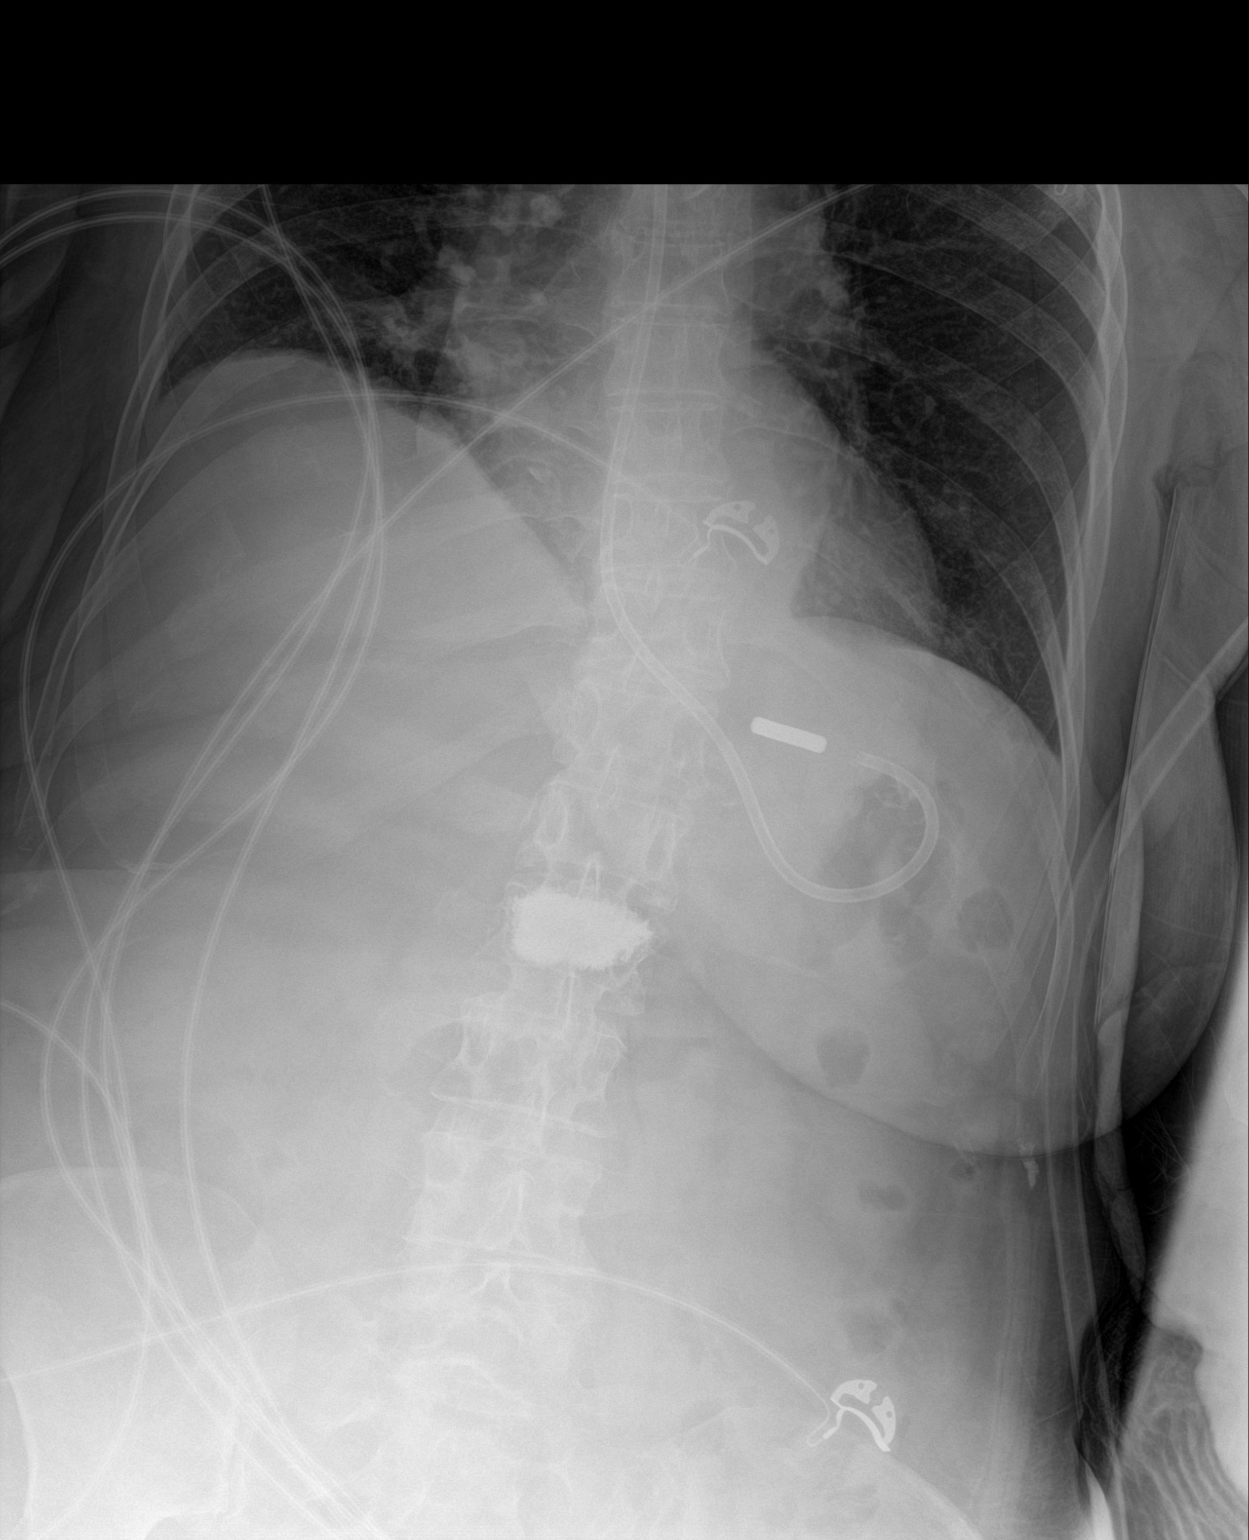

[1 of 1 positions shown; findings below may reference images not displayed]

FINDINGS: Weighted enteric feeding tube remains in nearly unchanged position,
folded in the gastric fundus with tip directed toward the
gastroesophageal junction.
IMPRESSION: Weighted enteric feeding tube remains in nearly unchanged position,
folded in the gastric fundus with tip directed toward the
gastroesophageal junction.

## 2020-03-12 MED ORDER — CHLORHEXIDINE GLUCONATE CLOTH 2 % EX PADS
6.0000 | MEDICATED_PAD | Freq: Every day | CUTANEOUS | Status: DC
  Administered 2020-03-13 – 2020-03-14 (×3): 6 via TOPICAL

## 2020-03-12 MED ORDER — FREE WATER
100.0000 mL | Freq: Four times a day (QID) | Status: DC
  Administered 2020-03-12 – 2020-03-20 (×32): 100 mL

## 2020-03-12 MED ORDER — OSMOLITE 1.2 CAL PO LIQD
1000.0000 mL | ORAL | Status: DC
  Administered 2020-03-12 – 2020-03-13 (×2): 1000 mL

## 2020-03-12 NOTE — Progress Notes (Signed)
Pt arrived to unit via bed accompanied by radiology team. VSS. No s/s of distress. NGT now at the 93 cm mark.

## 2020-03-12 NOTE — Progress Notes (Signed)
Initial Nutrition Assessment  DOCUMENTATION CODES:   Not applicable  INTERVENTION:  - will order Osmolite 1.2 @ 60 ml/hr with 100 ml free water QID to provide 1728 kcal, 80 grams protein, and 1581 ml free water.    NUTRITION DIAGNOSIS:   Inadequate oral intake related to dysphagia, other (see comment) (current diet order) as evidenced by other (comment) (CLD, nectar-thick).  GOAL:   Patient will meet greater than or equal to 90% of their needs  MONITOR:   PO intake, Diet advancement, TF tolerance, Labs, Weight trends  REASON FOR ASSESSMENT:   Consult Enteral/tube feeding initiation and management  ASSESSMENT:   74 y.o. female with medical history of stroke, type 2 DM, HTN, and MI who was discharged in 01/2020 after stroke workup. She presented to the ED with N/V x2 days.  Patient noted to be a/o to self only. She was able to answer yes, no questions but was unable to provide any PTA information. She denies any abdominal pain or nausea at this time. No family/visitors present.  She requested water. Made RN aware d/t current diet order and precautions in place.  Per chart review, weight on 7/12 was 131 lb and weight today is 141 lb. Patient has mild edema to all extremities which is likely accounting for this increase.   Small bore NGT in place (gastric at 65 cm) in L nare. Will order TF regimen as outlined above.   Per notes: - AKI - concern for sepsis with no clear source - acute metabolic encephalopathy thought to be 2/2 uremia and possible infection - N/V (coffee-ground) on admission - severe dysphagia--SLP following   Labs reviewed; CBGs: 174 and 172 mg/dl, Cl: 89 mmol/l, BUN: 619 mg/dl, creatinine: 5.09 mg/dl, Ca: 7.5 mg/dl, Mg: 2.6 mg/dl, GFR: 5 ml/min. Medications reviewed; sliding scale novolog, 40 mg IV protonix BID. IVF; D5-1/4 NS-40 mEq KCl @ 40 ml/hr (163 kcal).    NUTRITION - FOCUSED PHYSICAL EXAM:  completed; no muscle or fat wasting, mild edema to  all extremities.   Diet Order:   Diet Order            Diet clear liquid Room service appropriate? Yes; Fluid consistency: Nectar Thick  Diet effective now                 EDUCATION NEEDS:   No education needs have been identified at this time  Skin:  Skin Assessment: Reviewed RN Assessment  Last BM:  7/14  Height:   Ht Readings from Last 1 Encounters:  03/08/20 5\' 4"  (1.626 m)    Weight:   Wt Readings from Last 1 Encounters:  03/12/20 63.8 kg     Estimated Nutritional Needs:  Kcal:  1500-1700 kcal Protein:  75-85 grams Fluid:  >/= 1.8 L/day     03/14/20, MS, RD, LDN, CNSC Inpatient Clinical Dietitian RD pager # available in [facility]  After hours/weekend pager # available in [facility]

## 2020-03-12 NOTE — Progress Notes (Signed)
Pt connected to portable monitor and taken to Interventional Radiology by IR team via bed. VSS. No s/s of distress at this time.
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
## 2020-03-12 NOTE — Progress Notes (Signed)
PROGRESS NOTE    Tasha [name]  QZR:[id number] DOB: 07-11-1946 DOA: 03/08/2020 PCP: Patient, No Pcp Per   Chief Complaint  Patient presents with  . Emesis    Brief Narrative Tasha [name] Tasha [name] 74 y.o.femalewith medical history significant ofstroke, T2DM, HTN, MI who was recently discharged 01/2020 after Shonette Rhames stroke workup who represented on day of admission with nausea/vomiting.   History was limited due to the patient's mental status. She says "I don't know" when asked why she is here. Hx obtained from pt, staff, [facility], and chart. 2 days of nausea and vomiting which has progressively gotten worse. Staff reports progression to brown emesis. She denies fevers, chest pain. Endorses nausea, vomiting, and abdominal pain. Her son says at baseline, she's alert and oriented.   ED Course:Noted to have significant lactic acidosis and AKI. IVF, labs, EKG, imaging, antibiotics. Hospitalist to admit for aki/lactic acidosis and possible sepsis. Nephrology was c/s by the ED.   Patient admitted, placed empirically on IV antibiotics, pancultured, placed on aggressive IV fluid resuscitation with bicarb.  Nephrology consulted.  The evening of admission 03/08/2020 patient noted to have some coffee-ground emesis.  Hemoccult negative.  Patient placed on Lakeva Hollon Protonix drip.  Assessment & Plan:   Active Problems:   CVA (cerebral vascular accident) (HCC)   DM (diabetes mellitus), type 2 with peripheral vascular complications (HCC)   Essential (primary) hypertension   Lactic acidosis   Nausea and vomiting   AKI (acute kidney injury) (HCC)   Hyperkalemia   Sepsis (HCC)   Coffee ground emesis  Acute Kidney Injury Unclear etiology, suspected prerenal - possibly related to sepsis, ? Volume/hemodynamically mediated with nausea/vomiting on presentation. Renal US without hydro, CT without hydro Creatinine with modest improvement - 7.09 today Adequate UOP, foley d/c'd 7/14 Continue IVF Renal c/s,  appreciate recs   Concern for Sepsis  Leukocytosis  Hypothermia: hypothermia, leukocytosis, lactic acidosis at presentation, concerning for sepsis No clear source identified  CXR with atelectasis on 7/13 CT abd/pelvis without acute findings, RUQ [place] without evidence of cholecystitis UA not suggestive of UTI - urine cx no growth Blood cx NGTD x2, MRSA PCR negative Leukocytosis improving Vanc/cefepime -> will deescalate to ceftriaxone   Acute Metabolic Encephalopathy 2/2 above, uremia, possible infection - gradually improving, Dewan Emond&Ox3 today Renal following for uremia with aki, renal function slowly improving Continue abx for possible infection  TSH wnl, B12 wnl, folate wnl Head CT with L posterior internal capsule infarct, no new areas of infarction Delirium precautions   AGMA  Lactic Acidosis: bicarb 32 today, continue IVF  Nausea  Vomiting  Coffee Ground Emesis:  Coffee ground emesis noted on day of admission around 1949 (see nursing note) She's been started on PPI gtt - hemoccult was negative - ? Mallory weiss Hemoglobin downtrending, but suspect 2/2 dilution  Consider GI consultation if recurrent or significantly downtrending Hb Hold plavix and DVT ppx - consider resumption of plavix tomorrow Elevated lipase, follow repeat  Dysphagia: nectar thick liquid per speech  Panda tube placed yesterday, tip directed towards GE junction - IR consult to help position  Hyperkalemia  Hypokalemia: now hypokalemic -> replace conservatively with AKI  Hx CVA:  Patient with recent stroke discharged on 02/19/2020 was supposed to be on dual antiplatelet therapy x3 weeks then Plavix alone.  Patient noted to need Jahlon Baines repeat MRI in 3 months as well as 6-week follow-up with neurology.  Patient does have Candiace West chronic persistent right-sided weakness.  Statin on hold.  Plavix on hold with ?  GI bleed, will consider resumption tomorrow if she continues to be stable. PT/OT  T2DM:  A1c 6.9  01/2020 SSI  Hypertension:  Holding norvasc, HCTZ, metoprolol  Elevated troponin: mild x1, in setting of AKI - not clear to me why this was ordered last night.  No notes suggesting acute change in status.  Will defer checking additional troponins unless chest pain or other concerning symptoms.  DVT prophylaxis: SCD Code Status: full  Family Communication: none at bedside - called son, no answer Disposition:   Status is: Inpatient  Remains inpatient appropriate because:Inpatient level of care appropriate due to severity of illness   Dispo: The patient is from: SNF              Anticipated d/c is to: pending              Anticipated d/c date is: > 3 days              Patient currently is not medically stable to d/c.   Consultants:   renal  Procedures:   none  Antimicrobials: Anti-infectives (From admission, onward)   Start     Dose/Rate Route Frequency Ordered Stop   03/10/20 1200  cefTRIAXone (ROCEPHIN) 2 g in sodium chloride 0.9 % 100 mL IVPB     Discontinue     2 g 200 mL/hr over 30 Minutes Intravenous Every 24 hours 03/10/20 0924     03/09/20 1000  ceFEPIme (MAXIPIME) 1 g in sodium chloride 0.9 % 100 mL IVPB  Status:  Discontinued        1 g 200 mL/hr over 30 Minutes Intravenous Every 24 hours 03/08/20 1219 03/10/20 0924   03/08/20 1219  vancomycin variable dose per unstable renal function (pharmacist dosing)  Status:  Discontinued         Does not apply See admin instructions 03/08/20 1219 03/10/20 0906   03/08/20 1100  vancomycin (VANCOCIN) IVPB 1000 mg/200 mL premix        1,000 mg 200 mL/hr over 60 Minutes Intravenous  Once 03/08/20 1046 03/08/20 1406   03/08/20 1100  ceFEPIme (MAXIPIME) 2 g in sodium chloride 0.9 % 100 mL IVPB        2 g 200 mL/hr over 30 Minutes Intravenous  Once 03/08/20 1046 03/08/20 1211     Subjective: Milagro Belmares&Ox3 today No pain  Objective: Vitals:   03/12/20 1200 03/12/20 1300 03/12/20 1400 03/12/20 1500  BP: (!) 119/43 (!) 140/55 (!)  150/57 (!) 146/71  Pulse: (!) 52 (!) 51 63 66  Resp: 10 13 11 10   Temp:      TempSrc:      SpO2: 96% 96% 97% 96%  Weight:      Height:        Intake/Output Summary (Last 24 hours) at 03/12/2020 1524 Last data filed at 03/12/2020 1500 Gross per 24 hour  Intake 1367.44 ml  Output 2000 ml  Net -632.56 ml   Filed Weights   03/11/20 0439 03/11/20 2100 03/12/20 0425  Weight: 66.3 kg 63.8 kg 63.8 kg    Examination:  General: No acute distress. Cardiovascular: Heart sounds show Jovon Winterhalter regular rate, and rhythm Lungs: Clear to auscultation bilaterally Abdomen: Soft, nontender, nondistended.  NG in place. Neurological: Alert and oriented 3. R sided weakness. Skin: Warm and dry. No rashes or lesions. Extremities: No clubbing or cyanosis. No edema.   Data Reviewed: I have personally reviewed following labs and imaging studies  CBC: Recent Labs  Lab 03/08/20 0858 03/08/20  7829 03/08/20 1401 03/09/20 0233 03/10/20 0229 03/11/20 0252 03/12/20 0250  WBC 20.5*   < > 22.3* 17.1* 11.6* 11.5* 12.6*  NEUTROABS 16.7*  --   --   --  8.9* 8.8* 9.8*  HGB 13.9   < > 13.4 11.2* 10.0* 10.5* 12.0  HCT 43.8   < > 41.2 32.3* 28.9* 31.2* 36.0  MCV 92.4   < > 89.6 84.1 82.6 85.5 86.5  PLT 316   < > 394 271 201 180 158   < > = values in this interval not displayed.    Basic Metabolic Panel: Recent Labs  Lab 03/10/20 0229 03/10/20 1547 03/10/20 1658 03/10/20 2319 03/11/20 0252 03/12/20 0250  NA 140 141  --  139 137 137  K 2.8* 3.4*  --  3.4* 3.4* 4.2  CL 81* 82*  --  84* 86* 89*  CO2 31 35*  --  32 32 27  GLUCOSE 150* 135*  --  156* 152* 163*  BUN 143* 130*  --  124* 112* 103*  CREATININE 8.17*  8.05* 7.99*  --  7.75* 7.72* 7.09*  CALCIUM 6.5* 6.4*  --  6.4* 6.7* 7.5*  MG  --   --  1.5* 1.6* 1.5* 2.6*  PHOS 6.5*  --   --   --  4.2 3.9    GFR: Estimated Creatinine Clearance: 6.1 mL/min (Inice Sanluis) (by C-G formula based on SCr of 7.09 mg/dL (H)).  Liver Function Tests: Recent Labs  Lab  03/08/20 0858 03/08/20 0858 03/09/20 0233 03/10/20 0229 03/10/20 2319 03/11/20 0252 03/12/20 0250  AST 27  --  17  --  32 33 38  ALT 20  --  18  --  19 19 21   ALKPHOS 60  --  47  --  50 50 61  BILITOT 0.8  --  0.8  --  1.2 0.9 0.9  PROT 7.7  --  5.7*  --  5.5* 5.4* 6.2*  ALBUMIN 4.2   < > 3.2* 2.8* 2.8* 2.8* 3.0*   < > = values in this interval not displayed.    CBG: Recent Labs  Lab 03/11/20 1620 03/11/20 2018 03/12/20 0404 03/12/20 0735 03/12/20 1211  GLUCAP 169* 149* 174* 172* 162*     Recent Results (from the past 240 hour(s))  SARS Coronavirus 2 by RT PCR (hospital order, performed in [facility] hospital lab) Nasopharyngeal Nasopharyngeal Swab     Status: None   Collection Time: 03/08/20  9:00 AM   Specimen: Nasopharyngeal Swab  Result Value Ref Range Status   SARS Coronavirus 2 NEGATIVE NEGATIVE Final    Comment: (NOTE) SARS-CoV-2 target nucleic acids are NOT DETECTED.  The SARS-CoV-2 RNA is generally detectable in upper and lower respiratory specimens during the acute phase of infection. The lowest concentration of SARS-CoV-2 viral copies this assay can detect is 250 copies / mL. Keiosha Cancro negative result does not preclude SARS-CoV-2 infection and should not be used as the sole basis for treatment or other patient management decisions.  Kristien Salatino negative result may occur with improper specimen collection / handling, submission of specimen other than nasopharyngeal swab, presence of viral mutation(s) within the areas targeted by this assay, and inadequate number of viral copies (<250 copies / mL). Nicodemus Denk negative result must be combined with clinical observations, patient history, and epidemiological information.  Fact Sheet for Patients:   05/09/20  Fact Sheet for Healthcare Providers: BoilerBrush.com.cy  This test is not yet approved or  cleared by the https://pope.com/ FDA  and has been authorized for detection and/or  diagnosis of SARS-CoV-2 by FDA under an Emergency Use Authorization (EUA).  This EUA will remain in effect (meaning this test can be used) for the duration of the COVID-19 declaration under Section 564(b)(1) of the Act, 21 U.S.C. section 360bbb-3(b)(1), unless the authorization is terminated or revoked sooner.  Performed at [facility], [address], [place], [place] [address]   Culture, Urine     Status: None   Collection Time: 03/08/20 10:20 AM   Specimen: Urine, Random  Result Value Ref Range Status   Specimen Description   Final    URINE, RANDOM Performed at [facility], [address], [place], [place] [address]    Special Requests   Final    NONE Performed at [facility], [address], [place], [place] [address]    Culture   Final    NO GROWTH Performed at [facility], [address] [place], [place] [address]    Report Status 03/10/2020 FINAL  Final  Blood culture (routine x 2)     Status: None (Preliminary result)   Collection Time: 03/08/20 11:45 AM   Specimen: BLOOD  Result Value Ref Range Status   Specimen Description   Final    BLOOD RIGHT ANTECUBITAL Performed at [facility], [address], [place], [place] [address]    Special Requests   Final    BOTTLES DRAWN AEROBIC AND ANAEROBIC Blood Culture adequate volume Performed at [facility], [address], [place], [place] [address]    Culture   Final    NO GROWTH 4 DAYS Performed at [facility], [address] [place], [place] [address]    Report Status PENDING  Incomplete  Blood culture (routine x 2)     Status: None (Preliminary result)   Collection Time: 03/08/20 12:05 PM   Specimen: BLOOD  Result Value Ref Range Status   Specimen Description   Final    BLOOD LEFT WRIST Performed at [facility], [address] [place], [place] [address]    Special Requests    Final    BOTTLES DRAWN AEROBIC AND ANAEROBIC Blood Culture adequate volume Performed at [facility], [address] [place], [place] [address]    Culture   Final    NO GROWTH 4 DAYS Performed at [facility], [address], [place], [place] [address]    Report Status PENDING  Incomplete  MRSA PCR Screening     Status: None   Collection Time: 03/08/20  1:47 PM   Specimen: Nasopharyngeal  Result Value Ref Range Status   MRSA by PCR NEGATIVE NEGATIVE Final    Comment:        The GeneXpert MRSA Assay (FDA approved for NASAL specimens only), is one component of Christin Moline comprehensive MRSA colonization surveillance program. It is not intended to diagnose MRSA infection nor to guide or monitor treatment for MRSA infections. Performed at [facility], [address], [place], [place] [address]          Radiology Studies: DG Abd 1 View  Result Date: 03/12/2020 CLINICAL DATA:  NG tube placement EXAM: ABDOMEN - 1 VIEW COMPARISON:  03/12/2020 FINDINGS: Weighted enteric feeding tube remains in nearly unchanged position, folded in the gastric fundus with tip directed toward the gastroesophageal junction. IMPRESSION: Weighted enteric feeding tube remains in nearly unchanged position, folded in the gastric  fundus with tip directed toward the gastroesophageal junction. Electronically Signed   By: Lauralyn PrimesAlex  Bibbey M.D.   On: 03/12/2020 12:19   DG Abd 1 View  Result Date: 03/12/2020 CLINICAL DATA:  Enteric tube placement EXAM: ABDOMEN - 1 VIEW COMPARISON:  Abdominal radiograph from one day prior. FINDINGS: Weighted enteric tube terminates over the gastric fundus, oriented superiorly towards the proximal stomach. No disproportionately dilated small bowel loops. No evidence of pneumatosis or pneumoperitoneum. Clear lung bases. Stable mild elevation of the right hemidiaphragm. Vertebroplasty material overlies the L2 vertebral body. IMPRESSION: Weighted enteric  tube terminates over the gastric fundus, oriented superiorly towards the proximal stomach. Per discussion with the ordering provider, could pull back approximately 3-4 cm to attempt to reorient the tip. Electronically Signed   By: Delbert PhenixJason Shatiqua Heroux Poff M.D.   On: 03/12/2020 09:38   DG Abd 1 View  Result Date: 03/11/2020 CLINICAL DATA:  NG tube placement EXAM: ABDOMEN - 1 VIEW COMPARISON:  March 11, 2020 FINDINGS: The enteric tube projects over the gastric body/fundus. The tube is coiled within the gastric body. The bowel gas pattern is nonobstructive. The patient is status post prior vertebral augmentation of the L2 vertebral body. IMPRESSION: NG tube projects over the gastric body/fundus. Electronically Signed   By: Katherine Mantlehristopher  Green M.D.   On: 03/11/2020 21:06   DG Abd 1 View  Result Date: 03/11/2020 CLINICAL DATA:  Post NG tube placement. EXAM: ABDOMEN - 1 VIEW COMPARISON:  Abdominal CT 03/08/2020 FINDINGS: Weighted enteric tube with tip below the diaphragm in the stomach, located in the region of the gastric cardia. The stylet is still in place. Tip of the feeding tube is not post pyloric. Nonobstructive bowel gas pattern in the included abdomen. Included lung bases are clear. IMPRESSION: Weighted enteric tube with tip below the diaphragm in the stomach, located in the region of the gastric cardia. Electronically Signed   By: Narda RutherfordMelanie  Sanford M.D.   On: 03/11/2020 17:01        Scheduled Meds: . amLODipine  10 mg Oral Daily  . chlorhexidine  15 mL Mouth Rinse BID  . Chlorhexidine Gluconate Cloth  6 each Topical Daily  . free water  100 mL Per Tube Q6H  . insulin aspart  0-6 Units Subcutaneous TID WC  . mouth rinse  15 mL Mouth Rinse BID  . metoprolol tartrate  25 mg Oral BID  . pantoprazole  40 mg Intravenous Q12H   Continuous Infusions: . sodium chloride 250 mL (03/10/20 2211)  . cefTRIAXone (ROCEPHIN)  IV Stopped (03/12/20 1325)  . dextrose 5 % and 0.2 % NaCl 1,000 mL with potassium chloride  40 mEq infusion 40 mL/hr at 03/12/20 1519  . feeding supplement (OSMOLITE 1.2 CAL) Stopped (03/12/20 1523)     LOS: 4 days    Time spent: over 30 min    Lacretia Nicksaldwell Powell, MD [facility]   To contact the attending provider between 7A-7P or the covering provider during after hours 7P-7A, please log into the web site [email or web address] and access using universal [place] password for that web site. If you do not have the password, please call the hospital operator.  03/12/2020, 3:24 PM

## 2020-03-12 NOTE — Progress Notes (Signed)
Patient ID: Tasha [name], female   DOB: 28-Jan-1946, 74 y.o.   MRN: [id number] [facility] Progress Note   Assessment/ Plan:   1. Acute kidney Injury: Nonoliguric, suspected to be from sustained prerenal state with evolution to ATN (clinically suspected with component of sepsis).  Renal function showing signs of improvement with decreased creatinine and BUN overnight.  Urine output has been decent overnight via catheter for collection, will discontinue bladder scans. 2.  Possible sepsis: With leukocytosis/hypothermia and acute kidney injury.  Initially on broad-spectrum antimicrobial coverage with vancomycin and cefepime that has been narrowed down to ceftriaxone as blood and urine culture so far have been negative.  Leukocytosis persists. 3.  Altered mental status: Suspected to be acute metabolic encephalopathy possibly associated with her azotemia.  Attempting conservative medical management.  Mental status improving. 4.  History of CVA 5.  Hypertension: Intravenous fluids discontinued and oral antihypertensive therapy adjusted with some improvement of blood pressures overnight.  Subjective:   She appears to be much more awake and alert today, communicates appropriately.  No acute events overnight.   Objective:   BP (!) 124/44   Pulse (!) 52   Temp (!) 97.4 F (36.3 C) (Oral)   Resp 11   Ht 5\' 4"  (1.626 m)   Wt 63.8 kg   SpO2 96%   BMI 24.14 kg/m   Intake/Output Summary (Last 24 hours) at 03/12/2020 1259 Last data filed at 03/12/2020 03/14/2020 Gross per 24 hour  Intake 1104.44 ml  Output 1775 ml  Net -670.56 ml   Weight change: -2.5 kg  Physical Exam: Gen: Comfortably resting in bed, NG tube in situ CVS: Pulse regular bradycardia, S1 and S2 normal Resp: Clear to auscultation, no distinct rales or rhonchi Abd: Soft, obese, nontender Ext: No lower extremity edema  Imaging: DG Abd 1 View  Result Date: 03/12/2020 CLINICAL DATA:  NG tube placement EXAM: ABDOMEN - 1 VIEW  COMPARISON:  03/12/2020 FINDINGS: Weighted enteric feeding tube remains in nearly unchanged position, folded in the gastric fundus with tip directed toward the gastroesophageal junction. IMPRESSION: Weighted enteric feeding tube remains in nearly unchanged position, folded in the gastric fundus with tip directed toward the gastroesophageal junction. Electronically Signed   By: 03/14/2020 M.D.   On: 03/12/2020 12:19   DG Abd 1 View  Result Date: 03/12/2020 CLINICAL DATA:  Enteric tube placement EXAM: ABDOMEN - 1 VIEW COMPARISON:  Abdominal radiograph from one day prior. FINDINGS: Weighted enteric tube terminates over the gastric fundus, oriented superiorly towards the proximal stomach. No disproportionately dilated small bowel loops. No evidence of pneumatosis or pneumoperitoneum. Clear lung bases. Stable mild elevation of the right hemidiaphragm. Vertebroplasty material overlies the L2 vertebral body. IMPRESSION: Weighted enteric tube terminates over the gastric fundus, oriented superiorly towards the proximal stomach. Per discussion with the ordering provider, could pull back approximately 3-4 cm to attempt to reorient the tip. Electronically Signed   By: 03/14/2020 M.D.   On: 03/12/2020 09:38   DG Abd 1 View  Result Date: 03/11/2020 CLINICAL DATA:  NG tube placement EXAM: ABDOMEN - 1 VIEW COMPARISON:  March 11, 2020 FINDINGS: The enteric tube projects over the gastric body/fundus. The tube is coiled within the gastric body. The bowel gas pattern is nonobstructive. The patient is status post prior vertebral augmentation of the L2 vertebral body. IMPRESSION: NG tube projects over the gastric body/fundus. Electronically Signed   By: March 13, 2020 M.D.   On: 03/11/2020 21:06   DG Abd 1  View  Result Date: 03/11/2020 CLINICAL DATA:  Post NG tube placement. EXAM: ABDOMEN - 1 VIEW COMPARISON:  Abdominal CT 03/08/2020 FINDINGS: Weighted enteric tube with tip below the diaphragm in the stomach, located  in the region of the gastric cardia. The stylet is still in place. Tip of the feeding tube is not post pyloric. Nonobstructive bowel gas pattern in the included abdomen. Included lung bases are clear. IMPRESSION: Weighted enteric tube with tip below the diaphragm in the stomach, located in the region of the gastric cardia. Electronically Signed   By: Narda Rutherford M.D.   On: 03/11/2020 17:01    Labs: BMET Recent Labs  Lab 03/08/20 1401 03/09/20 0233 03/10/20 0229 03/10/20 1547 03/10/20 2319 03/11/20 0252 03/12/20 0250  NA 140 139 140 141 139 137 137  K 5.7* 3.7 2.8* 3.4* 3.4* 3.4* 4.2  CL 94* 91* 81* 82* 84* 86* 89*  CO2 7* 21* 31 35* 32 32 27  GLUCOSE 246* 97 150* 135* 156* 152* 163*  BUN 109* 122* 143* 130* 124* 112* 103*  CREATININE 9.33* 8.37* 8.17*  8.05* 7.99* 7.75* 7.72* 7.09*  CALCIUM 8.0* 7.2* 6.5* 6.4* 6.4* 6.7* 7.5*  PHOS  --   --  6.5*  --   --  4.2 3.9   CBC Recent Labs  Lab 03/08/20 0858 03/08/20 1401 03/09/20 0233 03/10/20 0229 03/11/20 0252 03/12/20 0250  WBC 20.5*   < > 17.1* 11.6* 11.5* 12.6*  NEUTROABS 16.7*  --   --  8.9* 8.8* 9.8*  HGB 13.9   < > 11.2* 10.0* 10.5* 12.0  HCT 43.8   < > 32.3* 28.9* 31.2* 36.0  MCV 92.4   < > 84.1 82.6 85.5 86.5  PLT 316   < > 271 201 180 158   < > = values in this interval not displayed.    Medications:    . amLODipine  10 mg Oral Daily  . chlorhexidine  15 mL Mouth Rinse BID  . Chlorhexidine Gluconate Cloth  6 each Topical Daily  . free water  100 mL Per Tube Q6H  . insulin aspart  0-6 Units Subcutaneous TID WC  . mouth rinse  15 mL Mouth Rinse BID  . metoprolol tartrate  25 mg Oral BID  . pantoprazole  40 mg Intravenous Q12H   Zetta Bills, MD 03/12/2020, 12:59 PM

## 2020-03-13 ENCOUNTER — Inpatient Hospital Stay (HOSPITAL_COMMUNITY): Payer: Medicare Other

## 2020-03-13 LAB — CBC WITH DIFFERENTIAL/PLATELET
Abs Immature Granulocytes: 0.2 10*3/uL — ABNORMAL HIGH (ref 0.00–0.07)
Basophils Absolute: 0 10*3/uL (ref 0.0–0.1)
Basophils Relative: 0 %
Eosinophils Absolute: 0.6 10*3/uL — ABNORMAL HIGH (ref 0.0–0.5)
Eosinophils Relative: 4 %
HCT: 35.7 % — ABNORMAL LOW (ref 36.0–46.0)
Hemoglobin: 11.9 g/dL — ABNORMAL LOW (ref 12.0–15.0)
Immature Granulocytes: 2 %
Lymphocytes Relative: 11 %
Lymphs Abs: 1.3 10*3/uL (ref 0.7–4.0)
MCH: 28.9 pg (ref 26.0–34.0)
MCHC: 33.3 g/dL (ref 30.0–36.0)
MCV: 86.7 fL (ref 80.0–100.0)
Monocytes Absolute: 0.8 10*3/uL (ref 0.1–1.0)
Monocytes Relative: 6 %
Neutro Abs: 9.5 10*3/uL — ABNORMAL HIGH (ref 1.7–7.7)
Neutrophils Relative %: 77 %
Platelets: 160 10*3/uL (ref 150–400)
RBC: 4.12 MIL/uL (ref 3.87–5.11)
RDW: 13.4 % (ref 11.5–15.5)
WBC: 12.4 10*3/uL — ABNORMAL HIGH (ref 4.0–10.5)
nRBC: 0 % (ref 0.0–0.2)

## 2020-03-13 LAB — RENAL FUNCTION PANEL
Albumin: 2.9 g/dL — ABNORMAL LOW (ref 3.5–5.0)
Anion gap: 15 (ref 5–15)
BUN: 99 mg/dL — ABNORMAL HIGH (ref 8–23)
CO2: 26 mmol/L (ref 22–32)
Calcium: 7.9 mg/dL — ABNORMAL LOW (ref 8.9–10.3)
Chloride: 93 mmol/L — ABNORMAL LOW (ref 98–111)
Creatinine, Ser: 6.18 mg/dL — ABNORMAL HIGH (ref 0.44–1.00)
GFR calc Af Amer: 7 mL/min — ABNORMAL LOW (ref >60)
GFR calc non Af Amer: 6 mL/min — ABNORMAL LOW (ref >60)
Glucose, Bld: 311 mg/dL — ABNORMAL HIGH (ref 70–99)
Phosphorus: 3.7 mg/dL (ref 2.5–4.6)
Potassium: 3.7 mmol/L (ref 3.5–5.1)
Sodium: 134 mmol/L — ABNORMAL LOW (ref 135–145)

## 2020-03-13 LAB — GLUCOSE, CAPILLARY
Glucose-Capillary: 192 mg/dL — ABNORMAL HIGH (ref 70–99)
Glucose-Capillary: 214 mg/dL — ABNORMAL HIGH (ref 70–99)
Glucose-Capillary: 257 mg/dL — ABNORMAL HIGH (ref 70–99)
Glucose-Capillary: 276 mg/dL — ABNORMAL HIGH (ref 70–99)
Glucose-Capillary: 282 mg/dL — ABNORMAL HIGH (ref 70–99)

## 2020-03-13 LAB — CULTURE, BLOOD (ROUTINE X 2)
Culture: NO GROWTH
Culture: NO GROWTH
Special Requests: ADEQUATE
Special Requests: ADEQUATE

## 2020-03-13 LAB — MAGNESIUM: Magnesium: 2.2 mg/dL (ref 1.7–2.4)

## 2020-03-13 LAB — PHOSPHORUS: Phosphorus: 3.7 mg/dL (ref 2.5–4.6)

## 2020-03-13 MED ORDER — INSULIN DETEMIR 100 UNIT/ML ~~LOC~~ SOLN
4.0000 [IU] | Freq: Two times a day (BID) | SUBCUTANEOUS | Status: DC
  Administered 2020-03-13 – 2020-03-19 (×13): 4 [IU] via SUBCUTANEOUS
  Filled 2020-03-13 (×14): qty 0.04

## 2020-03-13 MED ORDER — OSMOLITE 1.2 CAL PO LIQD
1000.0000 mL | ORAL | Status: DC
  Administered 2020-03-13 – 2020-03-16 (×4): 1000 mL
  Filled 2020-03-13 (×4): qty 1000

## 2020-03-13 MED ORDER — CLOPIDOGREL BISULFATE 75 MG PO TABS
75.0000 mg | ORAL_TABLET | Freq: Every day | ORAL | Status: DC
  Administered 2020-03-13 – 2020-03-14 (×2): 75 mg via ORAL
  Filled 2020-03-13 (×2): qty 1

## 2020-03-13 MED ORDER — INSULIN ASPART 100 UNIT/ML ~~LOC~~ SOLN
0.0000 [IU] | SUBCUTANEOUS | Status: DC
  Administered 2020-03-13: 3 [IU] via SUBCUTANEOUS
  Administered 2020-03-13: 2 [IU] via SUBCUTANEOUS
  Administered 2020-03-13 (×3): 5 [IU] via SUBCUTANEOUS
  Administered 2020-03-14: 2 [IU] via SUBCUTANEOUS
  Administered 2020-03-14: 1 [IU] via SUBCUTANEOUS
  Administered 2020-03-14 – 2020-03-16 (×11): 2 [IU] via SUBCUTANEOUS
  Administered 2020-03-16: 1 [IU] via SUBCUTANEOUS
  Administered 2020-03-16 (×3): 2 [IU] via SUBCUTANEOUS
  Administered 2020-03-17: 3 [IU] via SUBCUTANEOUS
  Administered 2020-03-17: 2 [IU] via SUBCUTANEOUS
  Administered 2020-03-17 – 2020-03-18 (×6): 3 [IU] via SUBCUTANEOUS
  Administered 2020-03-18 (×3): 2 [IU] via SUBCUTANEOUS
  Administered 2020-03-18: 3 [IU] via SUBCUTANEOUS
  Administered 2020-03-19: 2 [IU] via SUBCUTANEOUS
  Administered 2020-03-19 (×4): 3 [IU] via SUBCUTANEOUS
  Administered 2020-03-19 – 2020-03-20 (×2): 2 [IU] via SUBCUTANEOUS
  Administered 2020-03-20: 3 [IU] via SUBCUTANEOUS
  Administered 2020-03-20: 5 [IU] via SUBCUTANEOUS
  Administered 2020-03-20: 2 [IU] via SUBCUTANEOUS
  Administered 2020-03-20: 3 [IU] via SUBCUTANEOUS
  Administered 2020-03-20: 2 [IU] via SUBCUTANEOUS
  Administered 2020-03-21: 3 [IU] via SUBCUTANEOUS
  Administered 2020-03-21: 5 [IU] via SUBCUTANEOUS
  Administered 2020-03-21: 7 [IU] via SUBCUTANEOUS
  Administered 2020-03-21: 2 [IU] via SUBCUTANEOUS
  Administered 2020-03-21: 3 [IU] via SUBCUTANEOUS
  Administered 2020-03-21: 1 [IU] via SUBCUTANEOUS
  Administered 2020-03-22: 7 [IU] via SUBCUTANEOUS
  Administered 2020-03-22: 3 [IU] via SUBCUTANEOUS
  Administered 2020-03-22: 5 [IU] via SUBCUTANEOUS

## 2020-03-13 MED ORDER — PANTOPRAZOLE SODIUM 40 MG PO TBEC
40.0000 mg | DELAYED_RELEASE_TABLET | Freq: Every day | ORAL | Status: DC
  Administered 2020-03-13: 40 mg via ORAL
  Filled 2020-03-13 (×2): qty 1
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
## 2020-03-13 NOTE — Progress Notes (Signed)
MRI down for unknown period of time. Spoke with radiology director, who contacted [employer] for support. Spoke with RN.

## 2020-03-13 NOTE — Progress Notes (Signed)
Patient ID: Tasha [name], female   DOB: Dec 03, 1945, 74 y.o.   MRN: [id number] [facility] Progress Note   Assessment/ Plan:   1. Acute kidney Injury: Nonoliguric, suspected to be from sustained prerenal state with evolution to ATN (clinically suspected with component of sepsis).  Renal function continues to show improvement with downtrending BUN/creatinine and decent urine output.  No indications for dialysis. 2.  Possible sepsis: With leukocytosis/hypothermia and acute kidney injury.  Initially on broad-spectrum antimicrobial coverage with vancomycin and cefepime that has been narrowed down to ceftriaxone as blood and urine culture so far have been negative.  Low-level leukocytosis persists. 3.  Altered mental status: Likely secondary to sepsis versus azotemia-improving. 4.  History of CVA 5.  Hypertension: Intravenous fluids discontinued and oral antihypertensive therapy adjusted with some improvement of blood pressures overnight.  Subjective:   No acute events overnight.  She does not voice any complaints-denies any chest pain or shortness of breath.   Objective:   BP (!) 167/64   Pulse 77   Temp 98 F (36.7 C) (Oral)   Resp 14   Ht 5\' 4"  (1.626 m)   Wt 64.6 kg   SpO2 97%   BMI 24.45 kg/m   Intake/Output Summary (Last 24 hours) at 03/13/2020 1136 Last data filed at 03/13/2020 1010 Gross per 24 hour  Intake 2523.17 ml  Output 1575 ml  Net 948.17 ml   Weight change: 0.8 kg  Physical Exam: Gen: Appears fatigued but comfortable resting in bed.  NGT in situ. CVS: Pulse regular rhythm and normal rate, S1 and S2 normal Resp: Clear to auscultation, no distinct rales or rhonchi Abd: Soft, obese, nontender Ext: No lower extremity edema  Imaging: DG Abd 1 View  Result Date: 03/12/2020 CLINICAL DATA:  NG tube placement EXAM: ABDOMEN - 1 VIEW COMPARISON:  03/12/2020 FINDINGS: Weighted enteric feeding tube remains in nearly unchanged position, folded in the gastric fundus  with tip directed toward the gastroesophageal junction. IMPRESSION: Weighted enteric feeding tube remains in nearly unchanged position, folded in the gastric fundus with tip directed toward the gastroesophageal junction. Electronically Signed   By: 03/14/2020 M.D.   On: 03/12/2020 12:19   DG Abd 1 View  Result Date: 03/12/2020 CLINICAL DATA:  Enteric tube placement EXAM: ABDOMEN - 1 VIEW COMPARISON:  Abdominal radiograph from one day prior. FINDINGS: Weighted enteric tube terminates over the gastric fundus, oriented superiorly towards the proximal stomach. No disproportionately dilated small bowel loops. No evidence of pneumatosis or pneumoperitoneum. Clear lung bases. Stable mild elevation of the right hemidiaphragm. Vertebroplasty material overlies the L2 vertebral body. IMPRESSION: Weighted enteric tube terminates over the gastric fundus, oriented superiorly towards the proximal stomach. Per discussion with the ordering provider, could pull back approximately 3-4 cm to attempt to reorient the tip. Electronically Signed   By: 03/14/2020 M.D.   On: 03/12/2020 09:38   DG Abd 1 View  Result Date: 03/11/2020 CLINICAL DATA:  NG tube placement EXAM: ABDOMEN - 1 VIEW COMPARISON:  March 11, 2020 FINDINGS: The enteric tube projects over the gastric body/fundus. The tube is coiled within the gastric body. The bowel gas pattern is nonobstructive. The patient is status post prior vertebral augmentation of the L2 vertebral body. IMPRESSION: NG tube projects over the gastric body/fundus. Electronically Signed   By: March 13, 2020 M.D.   On: 03/11/2020 21:06   DG Abd 1 View  Result Date: 03/11/2020 CLINICAL DATA:  Post NG tube placement. EXAM: ABDOMEN - 1 VIEW  COMPARISON:  Abdominal CT 03/08/2020 FINDINGS: Weighted enteric tube with tip below the diaphragm in the stomach, located in the region of the gastric cardia. The stylet is still in place. Tip of the feeding tube is not post pyloric. Nonobstructive bowel  gas pattern in the included abdomen. Included lung bases are clear. IMPRESSION: Weighted enteric tube with tip below the diaphragm in the stomach, located in the region of the gastric cardia. Electronically Signed   By: Narda Rutherford M.D.   On: 03/11/2020 17:01   DG INTRO LONG GI TUBE  Result Date: 03/12/2020 CLINICAL DATA:  Feeding tube advancement. EXAM: FL FEEDING TUBE PLACEMENT CONTRAST:  Approximately 50 cc of Omnipaque FLUOROSCOPY TIME:  Fluoroscopy Time:  4 minutes and 42 seconds Radiation Exposure Index (if provided by the fluoroscopic device): 46.1 mGy Number of Acquired Spot Images: 0 COMPARISON:  Abdominal film of earlier in the day FINDINGS: An Amplatz wire was placed in the feeding tube. The feeding tube was advanced the level of the ligament of Treitz and location confirmed with a small amount of contrast. IMPRESSION: Feeding tube advancement into the distal duodenum. Electronically Signed   By: Jeronimo Greaves M.D.   On: 03/12/2020 16:06    Labs: BMET Recent Labs  Lab 03/09/20 0233 03/10/20 0229 03/10/20 1547 03/10/20 2319 03/11/20 0252 03/12/20 0250 03/13/20 0251  NA 139 140 141 139 137 137 134*  K 3.7 2.8* 3.4* 3.4* 3.4* 4.2 3.7  CL 91* 81* 82* 84* 86* 89* 93*  CO2 21* 31 35* 32 32 27 26  GLUCOSE 97 150* 135* 156* 152* 163* 311*  BUN 122* 143* 130* 124* 112* 103* 99*  CREATININE 8.37* 8.17*  8.05* 7.99* 7.75* 7.72* 7.09* 6.18*  CALCIUM 7.2* 6.5* 6.4* 6.4* 6.7* 7.5* 7.9*  PHOS  --  6.5*  --   --  4.2 3.9 3.7  3.7   CBC Recent Labs  Lab 03/10/20 0229 03/11/20 0252 03/12/20 0250 03/13/20 0251  WBC 11.6* 11.5* 12.6* 12.4*  NEUTROABS 8.9* 8.8* 9.8* 9.5*  HGB 10.0* 10.5* 12.0 11.9*  HCT 28.9* 31.2* 36.0 35.7*  MCV 82.6 85.5 86.5 86.7  PLT 201 180 158 160    Medications:    . amLODipine  10 mg Oral Daily  . chlorhexidine  15 mL Mouth Rinse BID  . Chlorhexidine Gluconate Cloth  6 each Topical Daily  . clopidogrel  75 mg Oral Daily  . free water  100 mL Per  Tube Q6H  . insulin aspart  0-9 Units Subcutaneous Q4H  . insulin detemir  4 Units Subcutaneous BID  . mouth rinse  15 mL Mouth Rinse BID  . metoprolol tartrate  25 mg Oral BID  . pantoprazole  40 mg Oral Daily   Zetta Bills, MD 03/13/2020, 11:36 AM

## 2020-03-13 NOTE — Progress Notes (Addendum)
PROGRESS NOTE    Adilenne Ashworth  ZOX:[id number] DOB: 09/14/1945 DOA: 03/08/2020 PCP: Patient, No Pcp Per   Chief Complaint  Patient presents with  . Emesis    Brief Narrative Shelisa Fern Felesia Stahlecker 74 y.o.femalewith medical history significant ofstroke, T2DM, HTN, MI who was recently discharged 01/2020 after Shyvonne Chastang stroke workup who represented on day of admission with nausea/vomiting.   History was limited due to the patient's mental status. She says "I don't know" when asked why she is here. Hx obtained from pt, staff, [facility], and chart. 2 days of nausea and vomiting which has progressively gotten worse. Staff reports progression to brown emesis. She denies fevers, chest pain. Endorses nausea, vomiting, and abdominal pain. Her son says at baseline, she's alert and oriented.   ED Course:Noted to have significant lactic acidosis and AKI. IVF, labs, EKG, imaging, antibiotics. Hospitalist to admit for aki/lactic acidosis and possible sepsis. Nephrology was c/s by the ED.   Patient admitted, placed empirically on IV antibiotics, pancultured, placed on aggressive IV fluid resuscitation with bicarb.  Nephrology consulted.  The evening of admission 03/08/2020 patient noted to have some coffee-ground emesis.  Hemoccult negative.  Patient placed on Melanie Pellot Protonix drip.  Assessment & Plan:   Active Problems:   CVA (cerebral vascular accident) (HCC)   DM (diabetes mellitus), type 2 with peripheral vascular complications (HCC)   Essential (primary) hypertension   Lactic acidosis   Nausea and vomiting   AKI (acute kidney injury) (HCC)   Hyperkalemia   Sepsis (HCC)   Coffee ground emesis  Acute Kidney Injury Unclear etiology, suspected prerenal - possibly related to sepsis, ? Volume/hemodynamically mediated with nausea/vomiting on presentation. Renal US without hydro, CT without hydro Creatinine with modest improvement - 6.18 today Adequate UOP, foley d/c'd 7/14 Continue IVF Renal c/s,  appreciate recs   Concern for Sepsis  Leukocytosis  Hypothermia: hypothermia, leukocytosis, lactic acidosis at presentation, concerning for sepsis No clear source identified  CXR with atelectasis on 7/13 CT abd/pelvis without acute findings, RUQ [place] without evidence of cholecystitis UA not suggestive of UTI - urine cx no growth Blood cx NGTD x2, MRSA PCR negative Leukocytosis improving, will continue to follow  Vanc/cefepime -> will deescalate to ceftriaxone - plan for 7 total days abx  Acute Metabolic Encephalopathy 2/2 above, uremia, possible infection - gradually improving, Nita Whitmire&Ox3 today, but still with delayed speech  Renal following for uremia with aki, renal function slowly improving Continue abx for possible infection  TSH wnl, B12 wnl, folate wnl Head CT with L posterior internal capsule infarct, no new areas of infarction Delirium precautions   AGMA  Lactic Acidosis: bicarb 32 today, continue IVF  Nausea  Vomiting  Coffee Ground Emesis:  Coffee ground emesis noted on day of admission around 1949 (see nursing note) hemoccult was negative - ? Mallory weiss Hemoglobin downtrending, but suspect 2/2 dilution  Consider GI consultation if recurrent or significantly downtrending Hb Hold plavix and DVT ppx - resuming plavix 7/17 Elevated lipase, follow repeat (downtrending) Oral PPI  Dysphagia: nectar thick liquid per speech  7/15 panda tube placed Tube feeds started  Hyperkalemia  Hypokalemia: now hypokalemic -> replace conservatively with AKI - per renal  Hx CVA:  Patient with recent stroke discharged on 02/19/2020 was supposed to be on dual antiplatelet therapy x3 weeks then Plavix alone.  Patient noted to need Zakya Halabi repeat MRI in 3 months as well as 6-week follow-up with neurology.  Patient does have Brigitt Mcclish chronic persistent right-sided weakness.  Statin on hold.  Resume plavix. PT/OT Right sided weakness has been persistent and significant (per therapy notes "much different")  will follow MRI given lack of imrpovement  T2DM:  A1c 6.9 01/2020 SSI, add levemir with tube feeds  Hypertension:  Holding norvasc, HCTZ, metoprolol  SVT: noted 7/13 PM, resolved.  Will continue to follow telemetry.  TSH wnl.    Elevated troponin: mild x1, in setting of AKI, asymptomatic.  Recheck if symptomatic.  Suspect demand.      DVT prophylaxis: SCD Code Status: full  Family Communication: none at bedside - called son 7/17 Disposition:   Status is: Inpatient  Remains inpatient appropriate because:Inpatient level of care appropriate due to severity of illness   Dispo: The patient is from: SNF              Anticipated d/c is to: pending              Anticipated d/c date is: > 3 days              Patient currently is not medically stable to d/c.   Consultants:   renal  Procedures:   none  Antimicrobials: Anti-infectives (From admission, onward)   Start     Dose/Rate Route Frequency Ordered Stop   03/10/20 1200  cefTRIAXone (ROCEPHIN) 2 g in sodium chloride 0.9 % 100 mL IVPB     Discontinue     2 g 200 mL/hr over 30 Minutes Intravenous Every 24 hours 03/10/20 0924     03/09/20 1000  ceFEPIme (MAXIPIME) 1 g in sodium chloride 0.9 % 100 mL IVPB  Status:  Discontinued        1 g 200 mL/hr over 30 Minutes Intravenous Every 24 hours 03/08/20 1219 03/10/20 0924   03/08/20 1219  vancomycin variable dose per unstable renal function (pharmacist dosing)  Status:  Discontinued         Does not apply See admin instructions 03/08/20 1219 03/10/20 0906   03/08/20 1100  vancomycin (VANCOCIN) IVPB 1000 mg/200 mL premix        1,000 mg 200 mL/hr over 60 Minutes Intravenous  Once 03/08/20 1046 03/08/20 1406   03/08/20 1100  ceFEPIme (MAXIPIME) 2 g in sodium chloride 0.9 % 100 mL IVPB        2 g 200 mL/hr over 30 Minutes Intravenous  Once 03/08/20 1046 03/08/20 1211     Subjective: Karn Derk&Ox3. Delayed response. Slow to respond.  Objective: Vitals:   03/13/20 0500 03/13/20 0600  03/13/20 0700 03/13/20 0800  BP: (!) 149/58 (!) 161/61 (!) 156/61 (!) 169/58  Pulse: 69 74 62 72  Resp: 11 11 10 12   Temp:    98 F (36.7 C)  TempSrc:    Oral  SpO2: 97% 98% 97% 97%  Weight: 64.6 kg     Height:        Intake/Output Summary (Last 24 hours) at 03/13/2020 0901 Last data filed at 03/13/2020 0800 Gross per 24 hour  Intake 2313.14 ml  Output 1750 ml  Net 563.14 ml   Filed Weights   03/11/20 2100 03/12/20 0425 03/13/20 0500  Weight: 63.8 kg 63.8 kg 64.6 kg    Examination:  General: No acute distress. Cardiovascular: Heart sounds show Romar Woodrick regular rate, and rhythm.  Lungs: Clear to auscultation bilaterally Abdomen: Soft, nontender, nondistended  Neurological: Alert and oriented 3. R sided weakness.  Delayed responses. Skin: Warm and dry. No rashes or lesions. Extremities: No clubbing or cyanosis. No edema.   Data Reviewed: I have  personally reviewed following labs and imaging studies  CBC: Recent Labs  Lab 03/08/20 0858 03/08/20 1401 03/09/20 0233 03/10/20 0229 03/11/20 0252 03/12/20 0250 03/13/20 0251  WBC 20.5*   < > 17.1* 11.6* 11.5* 12.6* 12.4*  NEUTROABS 16.7*  --   --  8.9* 8.8* 9.8* 9.5*  HGB 13.9   < > 11.2* 10.0* 10.5* 12.0 11.9*  HCT 43.8   < > 32.3* 28.9* 31.2* 36.0 35.7*  MCV 92.4   < > 84.1 82.6 85.5 86.5 86.7  PLT 316   < > 271 201 180 158 160   < > = values in this interval not displayed.    Basic Metabolic Panel: Recent Labs  Lab 03/10/20 0229 03/10/20 0229 03/10/20 1547 03/10/20 1658 03/10/20 2319 03/11/20 0252 03/12/20 0250 03/13/20 0251  NA 140   < > 141  --  139 137 137 134*  K 2.8*   < > 3.4*  --  3.4* 3.4* 4.2 3.7  CL 81*   < > 82*  --  84* 86* 89* 93*  CO2 31   < > 35*  --  32 32 27 26  GLUCOSE 150*   < > 135*  --  156* 152* 163* 311*  BUN 143*   < > 130*  --  124* 112* 103* 99*  CREATININE 8.17*  8.05*   < > 7.99*  --  7.75* 7.72* 7.09* 6.18*  CALCIUM 6.5*   < > 6.4*  --  6.4* 6.7* 7.5* 7.9*  MG  --   --   --   1.5* 1.6* 1.5* 2.6* 2.2  PHOS 6.5*  --   --   --   --  4.2 3.9 3.7  3.7   < > = values in this interval not displayed.    GFR: Estimated Creatinine Clearance: 7 mL/min (Camilia Caywood) (by C-G formula based on SCr of 6.18 mg/dL (H)).  Liver Function Tests: Recent Labs  Lab 03/08/20 0858 03/08/20 0858 03/09/20 0233 03/09/20 0233 03/10/20 0229 03/10/20 2319 03/11/20 0252 03/12/20 0250 03/13/20 0251  AST 27  --  17  --   --  32 33 38  --   ALT 20  --  18  --   --  19 19 21   --   ALKPHOS 60  --  47  --   --  50 50 61  --   BILITOT 0.8  --  0.8  --   --  1.2 0.9 0.9  --   PROT 7.7  --  5.7*  --   --  5.5* 5.4* 6.2*  --   ALBUMIN 4.2   < > 3.2*   < > 2.8* 2.8* 2.8* 3.0* 2.9*   < > = values in this interval not displayed.    CBG: Recent Labs  Lab 03/12/20 1935 03/12/20 2222 03/12/20 2341 03/13/20 0322 03/13/20 0745  GLUCAP 229* 273* 294* 282* 257*     Recent Results (from the past 240 hour(s))  SARS Coronavirus 2 by RT PCR (hospital order, performed in [facility] hospital lab) Nasopharyngeal Nasopharyngeal Swab     Status: None   Collection Time: 03/08/20  9:00 AM   Specimen: Nasopharyngeal Swab  Result Value Ref Range Status   SARS Coronavirus 2 NEGATIVE NEGATIVE Final    Comment: (NOTE) SARS-CoV-2 target nucleic acids are NOT DETECTED.  The SARS-CoV-2 RNA is generally detectable in upper and lower respiratory specimens during the acute phase of infection. The lowest concentration of SARS-CoV-2 viral  copies this assay can detect is 250 copies / mL. Cherae Marton negative result does not preclude SARS-CoV-2 infection and should not be used as the sole basis for treatment or other patient management decisions.  Noam Karaffa negative result may occur with improper specimen collection / handling, submission of specimen other than nasopharyngeal swab, presence of viral mutation(s) within the areas targeted by this assay, and inadequate number of viral copies (<250 copies / mL). Moncerrat Burnstein negative result must  be combined with clinical observations, patient history, and epidemiological information.  Fact Sheet for Patients:   BoilerBrush.com.cy  Fact Sheet for Healthcare Providers: https://pope.com/  This test is not yet approved or  cleared by the [place] FDA and has been authorized for detection and/or diagnosis of SARS-CoV-2 by FDA under an Emergency Use Authorization (EUA).  This EUA will remain in effect (meaning this test can be used) for the duration of the COVID-19 declaration under Section 564(b)(1) of the Act, 21 U.S.C. section 360bbb-3(b)(1), unless the authorization is terminated or revoked sooner.  Performed at [facility], [address] [place], [place] [address]   Culture, Urine     Status: None   Collection Time: 03/08/20 10:20 AM   Specimen: Urine, Random  Result Value Ref Range Status   Specimen Description   Final    URINE, RANDOM Performed at [facility], [address] [place], [place] [address]    Special Requests   Final    NONE Performed at [facility], [address] [place], [place] [address]    Culture   Final    NO GROWTH Performed at [facility], [address], [place], [place] [address]    Report Status 03/10/2020 FINAL  Final  Blood culture (routine x 2)     Status: None (Preliminary result)   Collection Time: 03/08/20 11:45 AM   Specimen: BLOOD  Result Value Ref Range Status   Specimen Description   Final    BLOOD RIGHT ANTECUBITAL Performed at [facility], [address] [place], [place] [address]    Special Requests   Final    BOTTLES DRAWN AEROBIC AND ANAEROBIC Blood Culture adequate volume Performed at [facility], [address] [place], [place] [address]    Culture   Final    NO GROWTH 4 DAYS Performed at [facility], [address] [place], [place] [address]      Report Status PENDING  Incomplete  Blood culture (routine x 2)     Status: None (Preliminary result)   Collection Time: 03/08/20 12:05 PM   Specimen: BLOOD  Result Value Ref Range Status   Specimen Description   Final    BLOOD LEFT WRIST Performed at [facility], [address], [place], [place] [address]    Special Requests   Final    BOTTLES DRAWN AEROBIC AND ANAEROBIC Blood Culture adequate volume Performed at [facility], [address] [place], [place] [address]    Culture   Final    NO GROWTH 4 DAYS Performed at [facility], [address], [place], [place] [address]    Report Status PENDING  Incomplete  MRSA PCR Screening     Status: None   Collection Time: 03/08/20  1:47 PM   Specimen: Nasopharyngeal  Result Value Ref Range Status   MRSA by PCR NEGATIVE NEGATIVE Final    Comment:  The GeneXpert MRSA Assay (FDA approved for NASAL specimens only), is one component of Heydi Swango comprehensive MRSA colonization surveillance program. It is not intended to diagnose MRSA infection nor to guide or monitor treatment for MRSA infections. Performed at [facility], [address] [place], [place] [id number]          Radiology Studies: DG Abd 1 View  Result Date: 03/12/2020 CLINICAL DATA:  NG tube placement EXAM: ABDOMEN - 1 VIEW COMPARISON:  03/12/2020 FINDINGS: Weighted enteric feeding tube remains in nearly unchanged position, folded in the gastric fundus with tip directed toward the gastroesophageal junction. IMPRESSION: Weighted enteric feeding tube remains in nearly unchanged position, folded in the gastric fundus with tip directed toward the gastroesophageal junction. Electronically Signed   By: Lauralyn PrimesAlex  Bibbey M.D.   On: 03/12/2020 12:19   DG Abd 1 View  Result Date: 03/12/2020 CLINICAL DATA:  Enteric tube placement EXAM: ABDOMEN - 1 VIEW COMPARISON:  Abdominal radiograph from one day prior. FINDINGS:  Weighted enteric tube terminates over the gastric fundus, oriented superiorly towards the proximal stomach. No disproportionately dilated small bowel loops. No evidence of pneumatosis or pneumoperitoneum. Clear lung bases. Stable mild elevation of the right hemidiaphragm. Vertebroplasty material overlies the L2 vertebral body. IMPRESSION: Weighted enteric tube terminates over the gastric fundus, oriented superiorly towards the proximal stomach. Per discussion with the ordering provider, could pull back approximately 3-4 cm to attempt to reorient the tip. Electronically Signed   By: Delbert PhenixJason Keigen Caddell Poff M.D.   On: 03/12/2020 09:38   DG Abd 1 View  Result Date: 03/11/2020 CLINICAL DATA:  NG tube placement EXAM: ABDOMEN - 1 VIEW COMPARISON:  March 11, 2020 FINDINGS: The enteric tube projects over the gastric body/fundus. The tube is coiled within the gastric body. The bowel gas pattern is nonobstructive. The patient is status post prior vertebral augmentation of the L2 vertebral body. IMPRESSION: NG tube projects over the gastric body/fundus. Electronically Signed   By: Katherine Mantlehristopher  Green M.D.   On: 03/11/2020 21:06   DG Abd 1 View  Result Date: 03/11/2020 CLINICAL DATA:  Post NG tube placement. EXAM: ABDOMEN - 1 VIEW COMPARISON:  Abdominal CT 03/08/2020 FINDINGS: Weighted enteric tube with tip below the diaphragm in the stomach, located in the region of the gastric cardia. The stylet is still in place. Tip of the feeding tube is not post pyloric. Nonobstructive bowel gas pattern in the included abdomen. Included lung bases are clear. IMPRESSION: Weighted enteric tube with tip below the diaphragm in the stomach, located in the region of the gastric cardia. Electronically Signed   By: Narda RutherfordMelanie  Sanford M.D.   On: 03/11/2020 17:01   DG INTRO LONG GI TUBE  Result Date: 03/12/2020 CLINICAL DATA:  Feeding tube advancement. EXAM: FL FEEDING TUBE PLACEMENT CONTRAST:  Approximately 50 cc of Omnipaque FLUOROSCOPY TIME:   Fluoroscopy Time:  4 minutes and 42 seconds Radiation Exposure Index (if provided by the fluoroscopic device): 46.1 mGy Number of Acquired Spot Images: 0 COMPARISON:  Abdominal film of earlier in the day FINDINGS: An Amplatz wire was placed in the feeding tube. The feeding tube was advanced the level of the ligament of Treitz and location confirmed with Yanelli Zapanta small amount of contrast. IMPRESSION: Feeding tube advancement into the distal duodenum. Electronically Signed   By: Jeronimo GreavesKyle  Talbot M.D.   On: 03/12/2020 16:06        Scheduled Meds: . amLODipine  10 mg Oral Daily  . chlorhexidine  15 mL Mouth Rinse BID  .  Chlorhexidine Gluconate Cloth  6 each Topical Daily  . clopidogrel  75 mg Oral Daily  . free water  100 mL Per Tube Q6H  . insulin aspart  0-9 Units Subcutaneous Q4H  . insulin detemir  4 Units Subcutaneous BID  . mouth rinse  15 mL Mouth Rinse BID  . metoprolol tartrate  25 mg Oral BID  . pantoprazole  40 mg Intravenous Q12H   Continuous Infusions: . sodium chloride 250 mL (03/10/20 2211)  . cefTRIAXone (ROCEPHIN)  IV Stopped (03/12/20 1325)  . dextrose 5 % and 0.2 % NaCl 1,000 mL with potassium chloride 40 mEq infusion 40 mL/hr at 03/13/20 0800  . feeding supplement (OSMOLITE 1.2 CAL) 60 mL/hr at 03/12/20 2200     LOS: 5 days    Time spent: over 30 min    Lacretia Nicks, MD [facility]   To contact the attending provider between 7A-7P or the covering provider during after hours 7P-7A, please log into the web site [email or web address] and access using universal [place] password for that web site. If you do not have the password, please call the hospital operator.  03/13/2020, 9:01 AM

## 2020-03-14 ENCOUNTER — Inpatient Hospital Stay (HOSPITAL_COMMUNITY): Payer: Medicare Other

## 2020-03-14 LAB — GLUCOSE, CAPILLARY
Glucose-Capillary: 147 mg/dL — ABNORMAL HIGH (ref 70–99)
Glucose-Capillary: 153 mg/dL — ABNORMAL HIGH (ref 70–99)
Glucose-Capillary: 155 mg/dL — ABNORMAL HIGH (ref 70–99)
Glucose-Capillary: 180 mg/dL — ABNORMAL HIGH (ref 70–99)

## 2020-03-14 LAB — CBC WITH DIFFERENTIAL/PLATELET
Abs Immature Granulocytes: 0.29 10*3/uL — ABNORMAL HIGH (ref 0.00–0.07)
Basophils Absolute: 0 10*3/uL (ref 0.0–0.1)
Basophils Relative: 0 %
Eosinophils Absolute: 0.7 10*3/uL — ABNORMAL HIGH (ref 0.0–0.5)
Eosinophils Relative: 5 %
HCT: 35.2 % — ABNORMAL LOW (ref 36.0–46.0)
Hemoglobin: 11.9 g/dL — ABNORMAL LOW (ref 12.0–15.0)
Immature Granulocytes: 2 %
Lymphocytes Relative: 13 %
Lymphs Abs: 1.8 10*3/uL (ref 0.7–4.0)
MCH: 29.2 pg (ref 26.0–34.0)
MCHC: 33.8 g/dL (ref 30.0–36.0)
MCV: 86.3 fL (ref 80.0–100.0)
Monocytes Absolute: 0.9 10*3/uL (ref 0.1–1.0)
Monocytes Relative: 6 %
Neutro Abs: 10.7 10*3/uL — ABNORMAL HIGH (ref 1.7–7.7)
Neutrophils Relative %: 74 %
Platelets: 180 10*3/uL (ref 150–400)
RBC: 4.08 MIL/uL (ref 3.87–5.11)
RDW: 13.7 % (ref 11.5–15.5)
WBC: 14.5 10*3/uL — ABNORMAL HIGH (ref 4.0–10.5)
nRBC: 0 % (ref 0.0–0.2)

## 2020-03-14 LAB — RENAL FUNCTION PANEL
Albumin: 2.8 g/dL — ABNORMAL LOW (ref 3.5–5.0)
Anion gap: 14 (ref 5–15)
BUN: 84 mg/dL — ABNORMAL HIGH (ref 8–23)
CO2: 26 mmol/L (ref 22–32)
Calcium: 8.6 mg/dL — ABNORMAL LOW (ref 8.9–10.3)
Chloride: 99 mmol/L (ref 98–111)
Creatinine, Ser: 5.49 mg/dL — ABNORMAL HIGH (ref 0.44–1.00)
GFR calc Af Amer: 8 mL/min — ABNORMAL LOW (ref >60)
GFR calc non Af Amer: 7 mL/min — ABNORMAL LOW (ref >60)
Glucose, Bld: 180 mg/dL — ABNORMAL HIGH (ref 70–99)
Phosphorus: 2.8 mg/dL (ref 2.5–4.6)
Potassium: 3.7 mmol/L (ref 3.5–5.1)
Sodium: 139 mmol/L (ref 135–145)

## 2020-03-14 LAB — MRSA PCR SCREENING: MRSA by PCR: NEGATIVE

## 2020-03-14 LAB — HEPATIC FUNCTION PANEL
ALT: 25 U/L (ref 0–44)
AST: 23 U/L (ref 15–41)
Albumin: 2.8 g/dL — ABNORMAL LOW (ref 3.5–5.0)
Alkaline Phosphatase: 60 U/L (ref 38–126)
Bilirubin, Direct: <0.1 mg/dL (ref 0.0–0.2)
Total Bilirubin: 0.3 mg/dL (ref 0.3–1.2)
Total Protein: 6.2 g/dL — ABNORMAL LOW (ref 6.5–8.1)

## 2020-03-14 LAB — PHOSPHORUS: Phosphorus: 3 mg/dL (ref 2.5–4.6)

## 2020-03-14 LAB — MAGNESIUM: Magnesium: 1.9 mg/dL (ref 1.7–2.4)

## 2020-03-14 IMAGING — MR MR HEAD W/O CM
10 series · 43 of 48 positions shown · non-contrast
Comparison: CT earlier same day, MRI [DATE]

CLINICAL DATA: Altered mental status, abnormal CT

EXAM:
MRI HEAD WITHOUT CONTRAST
TECHNIQUE: Multiplanar, multiecho pulse sequences of the brain and surrounding
structures were obtained without intravenous contrast.

[Series 5: dwi_tracew · axial · 3.0mm · 0.92mm/px · z∈[+1,+148]mm · 8 of 104 slices shown]
[im 1/104]
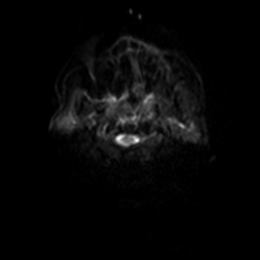
[im 21/104]
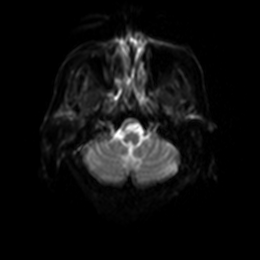
[im 31/104]
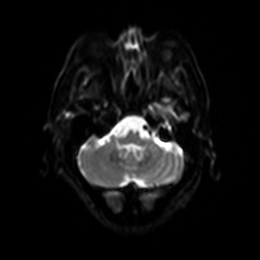
[im 42/104]
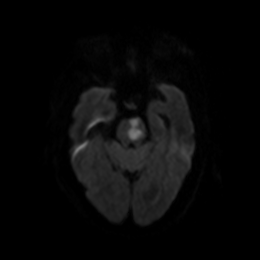
[im 62/104]
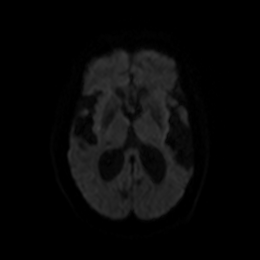
[im 73/104]
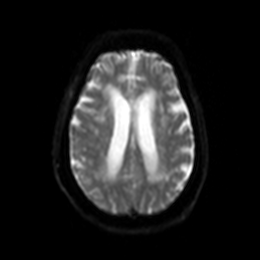
[im 83/104]
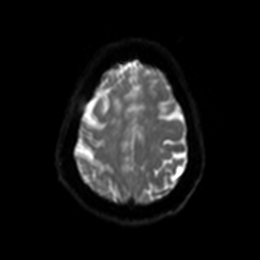
[im 104/104]
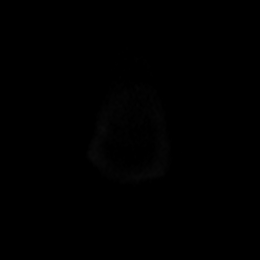

[Series 6: dwi_adc · axial · 3.0mm · 0.92mm/px · z∈[+1,+73]mm · 3 of 52 slices shown]
[im 1/52]
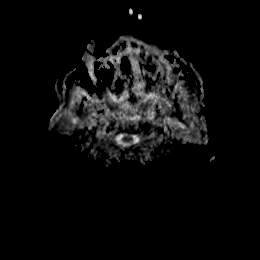
[im 13/52]
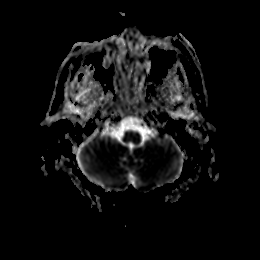
[im 26/52]
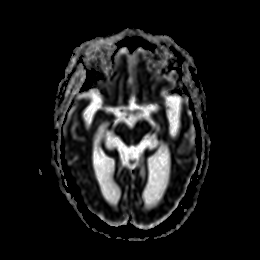

[Series 7: T2 · sagittal · 5.0mm · 0.47mm/px · 3 of 23 slices shown (1 of 3)]
[im 1/23]
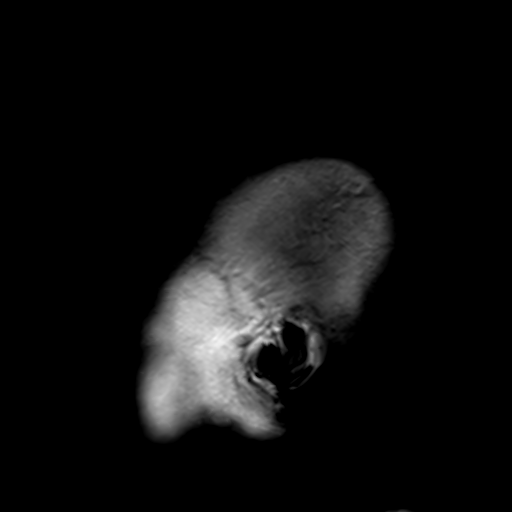
[im 12/23]
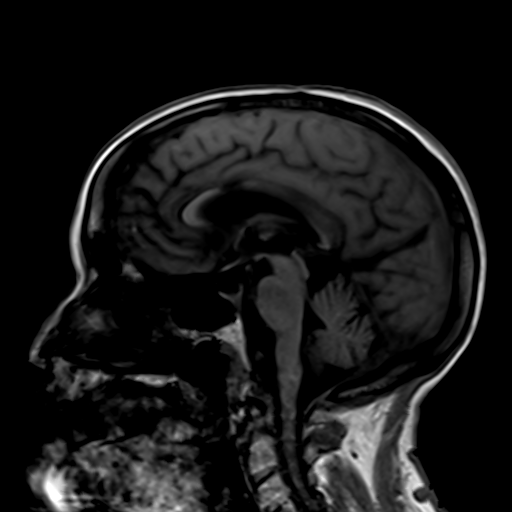
[im 23/23]
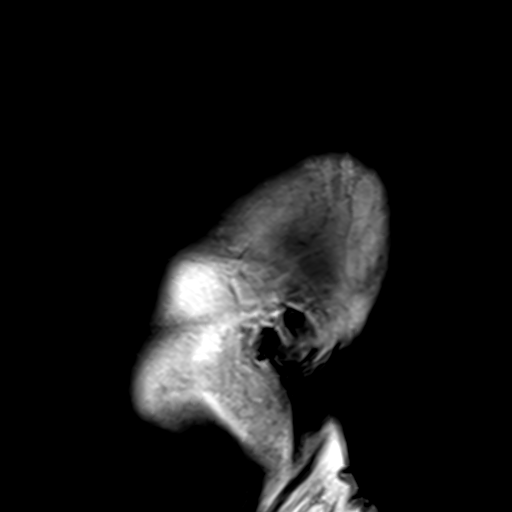

[Series 8: T2 · axial · 5.0mm · 0.45mm/px · z∈[-1,+149]mm · 3 of 25 slices shown (2 of 3)]
[im 1/25]
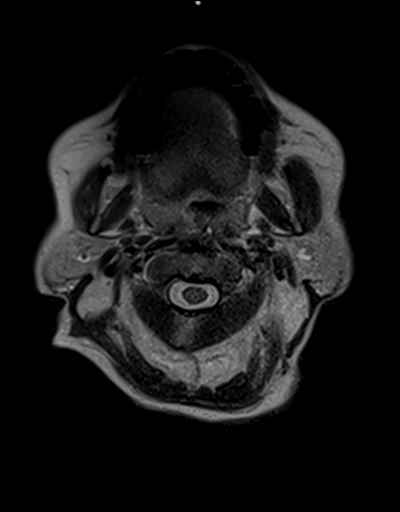
[im 13/25]
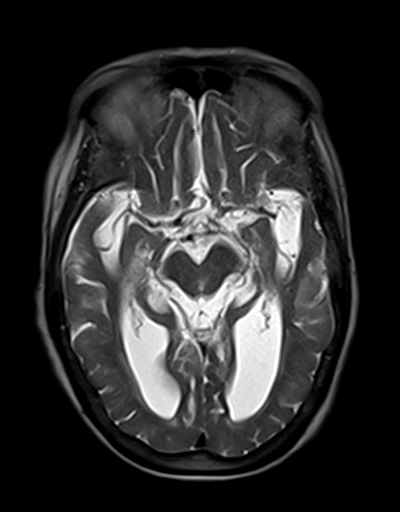
[im 25/25]
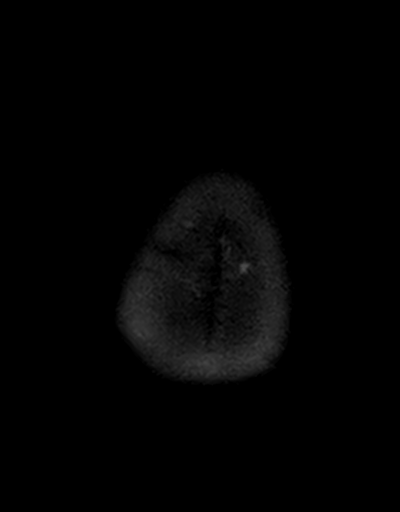

[Series 9: GRE · axial · 5.0mm · 0.45mm/px · z∈[-2,+147]mm · 4 of 32 slices shown]
[im 1/32]
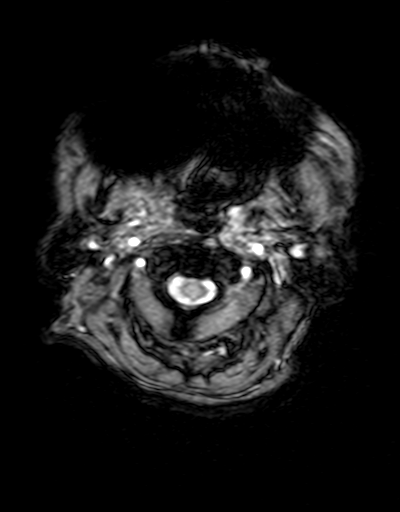
[im 11/32]
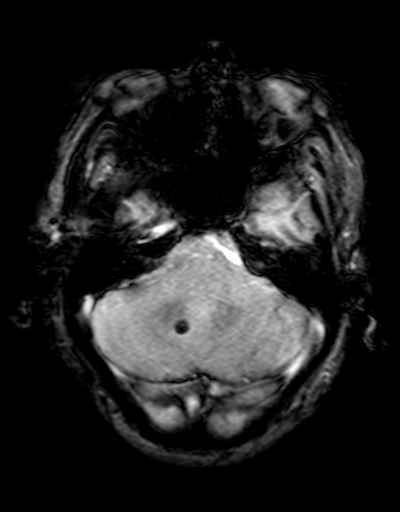
[im 21/32]
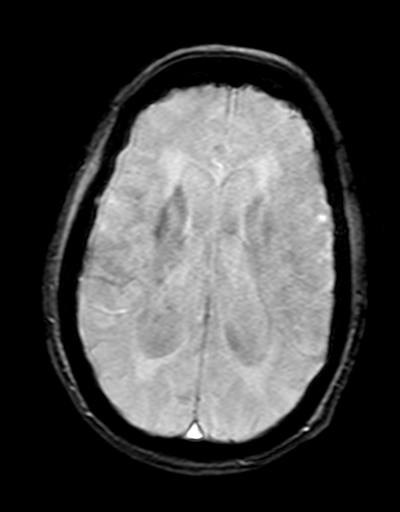
[im 32/32]
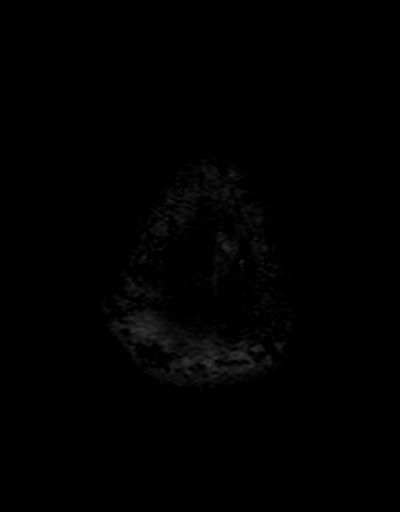

[Series 10: FLAIR · axial · 3.0mm · 0.86mm/px · z∈[-2,+148]mm · 6 of 53 slices shown]
[im 1/53]
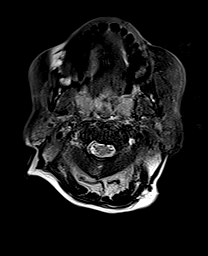
[im 11/53]
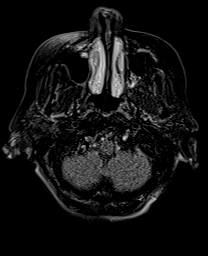
[im 21/53]
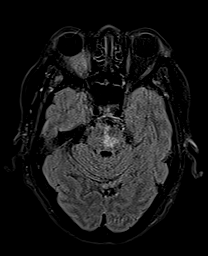
[im 32/53]
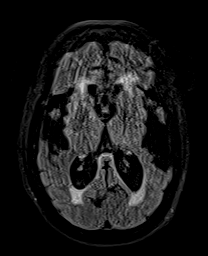
[im 42/53]
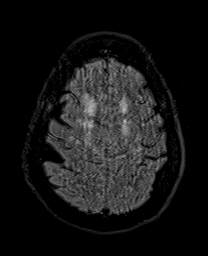
[im 53/53]
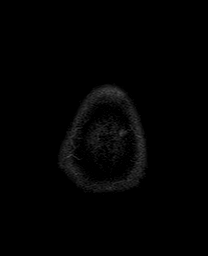

[Series 11: T1 · axial · 5.0mm · 0.45mm/px · z∈[-2,+147]mm · 4 of 32 slices shown]
[im 1/32]
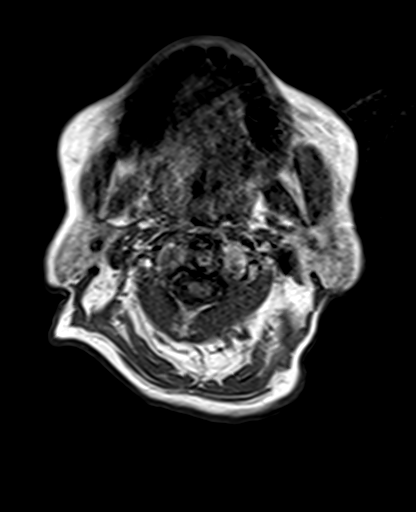
[im 11/32]
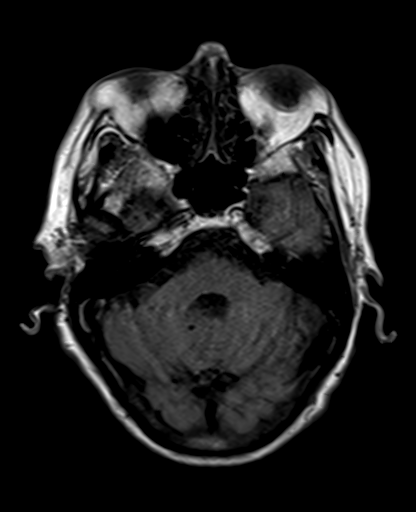
[im 21/32]
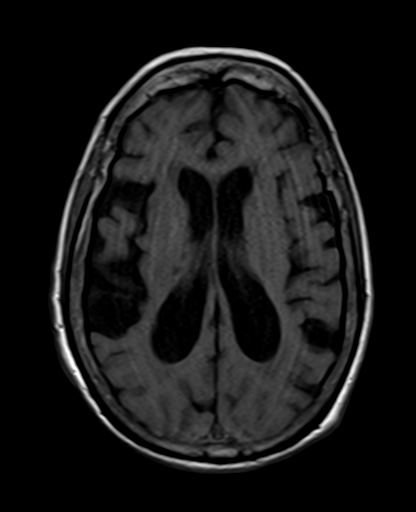
[im 32/32]
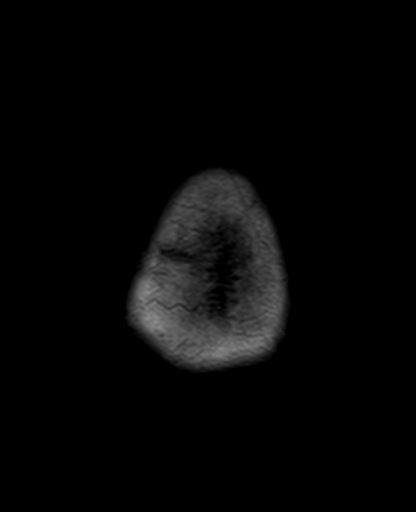

[Series 12: DWI · coronal · 5.0mm · 1.31mm/px · 6 of 56 slices shown (1 of 2)]
[im 1/56]
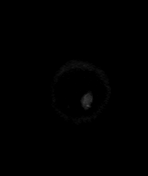
[im 12/56]
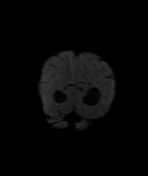
[im 23/56]
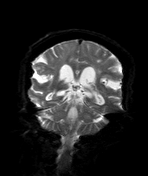
[im 34/56]
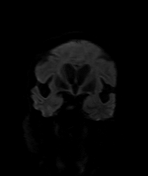
[im 45/56]
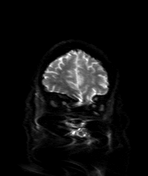
[im 56/56]
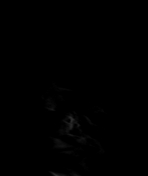

[Series 13: DWI · coronal · 5.0mm · 1.31mm/px · 3 of 28 slices shown (2 of 2)]
[im 1/28]
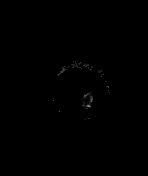
[im 14/28]
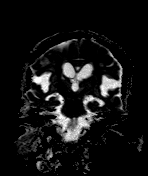
[im 28/28]
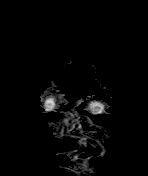

[Series 14: T2 · coronal · 5.0mm · 0.86mm/px · 3 of 28 slices shown (3 of 3)]
[im 1/28]
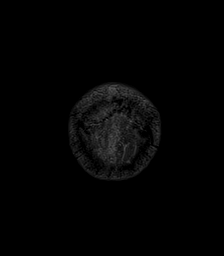
[im 14/28]
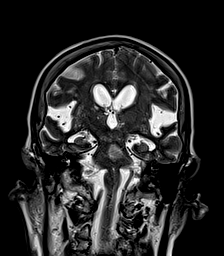
[im 28/28]
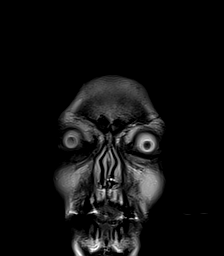

[43 of 48 positions shown; findings below may reference images not displayed]

FINDINGS: Motion artifact is present.

Brain: There is restricted diffusion within the left pons
corresponding to CT abnormality. Additional small focus of
restricted diffusion is present along the posterior body of the
right lateral ventricle.

Prominence of the ventricles and sulci reflects generalized
parenchymal volume loss. There is disproportionate ventricular
prominence which is likely on an ex vacuo basis but could reflect
communicating (normal pressure) hydrocephalus in the appropriate
clinical setting.

Patchy and confluent areas of T2 hyperintensity in the
supratentorial white matter are nonspecific but probably reflect
moderate chronic microvascular ischemic changes. There are chronic
small vessel infarcts of the basal ganglia and thalamus.

Focus of susceptibility in the lateral right thalamus is compatible
with chronic microhemorrhage or less likely mineralization. A focus
of susceptibility the parasagittal right cerebellum may reflect
chronic microhemorrhage, mineralization, or occult cavernous
malformation, noting corresponding hyperdensity on CT.

There is no intracranial mass or mass effect.

Vascular: Major vessel flow voids at the skull base are preserved.

Skull and upper cervical spine: Marrow signal is within normal
limits.

Sinuses/Orbits: Paranasal sinuses are aerated. Orbits are
unremarkable.

Other: Sella is unremarkable.  Mastoid air cells are clear.
IMPRESSION: Acute left pontine infarction. Acute small infarct along the
posterior body of the right lateral ventricle.

Chronic/nonemergent findings detailed above similar to recent prior
study.

## 2020-03-14 IMAGING — CT CT HEAD W/O CM
3 series · 15 of 47 positions shown, 18 images · non-contrast
Comparison: CT head [DATE].  MRI head [DATE]

CLINICAL DATA: Focal neuro deficit greater than 6 hours. Altered
mental status. Stroke.

EXAM:
CT HEAD WITHOUT CONTRAST
TECHNIQUE: Contiguous axial images were obtained from the base of the skull
through the vertex without intravenous contrast.

[Series 2: head wo · axial · 0.49mm/px · z∈[+1185,+1320]mm · 9 of 33 slices shown, 12 images]
[im 3/33  brain]
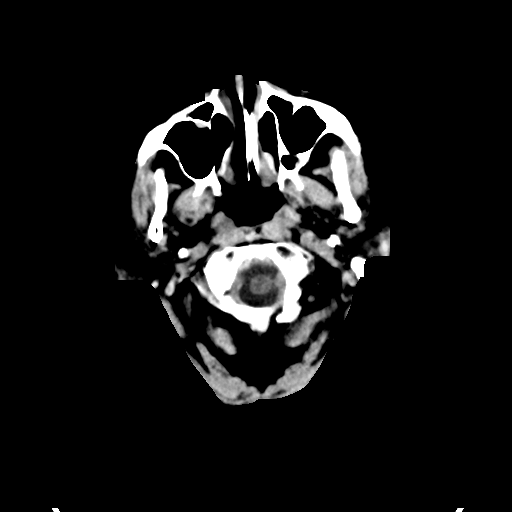
[im 3/33  bone]
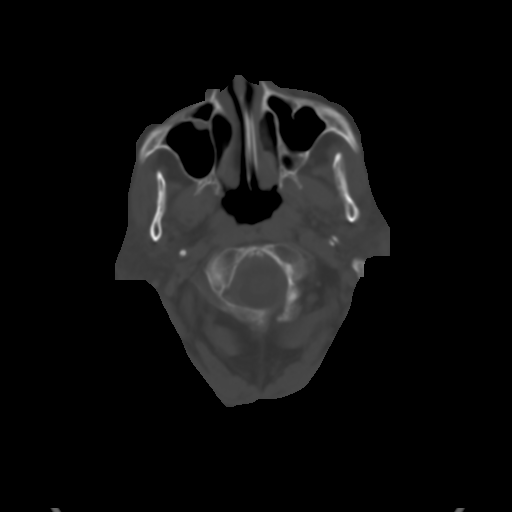
[im 6/33  brain]
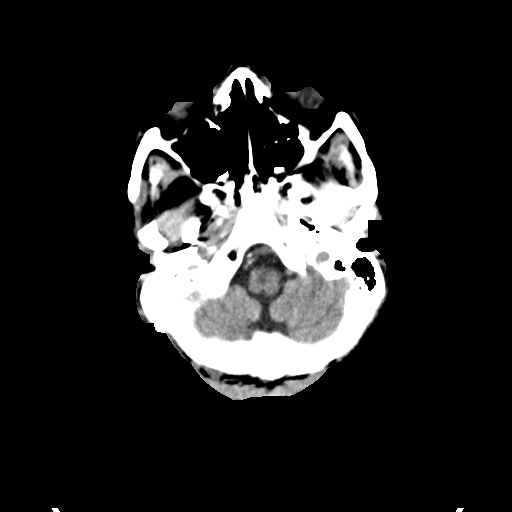
[im 9/33  brain]
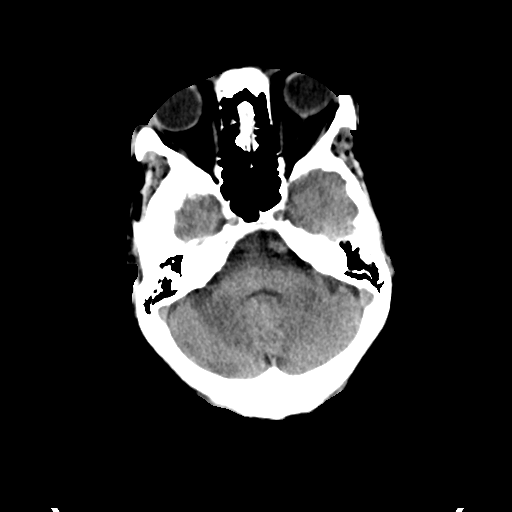
[im 13/33  brain]
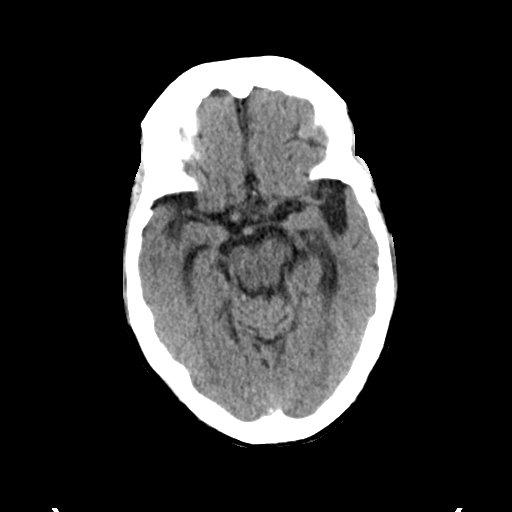
[im 17/33  brain]
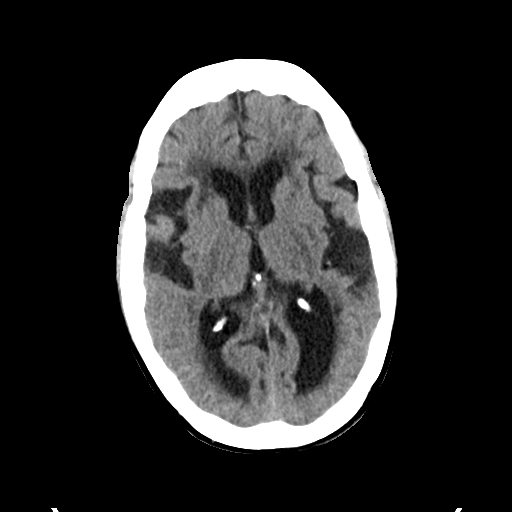
[im 17/33  bone]
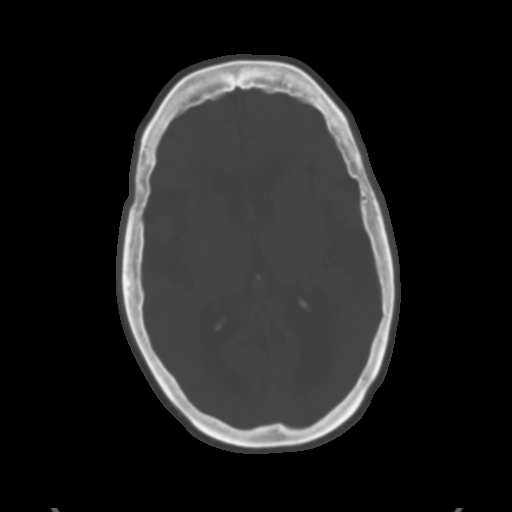
[im 20/33  brain]
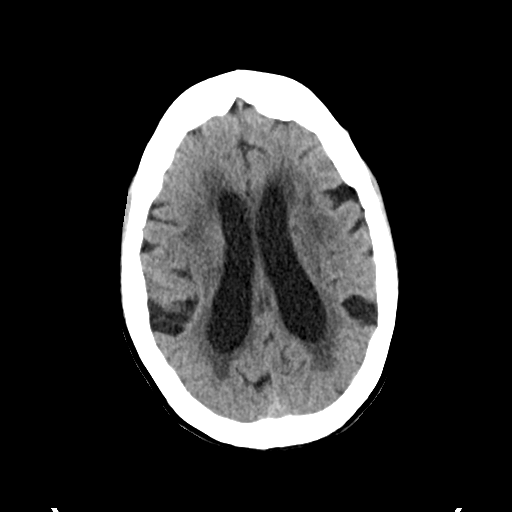
[im 24/33  brain]
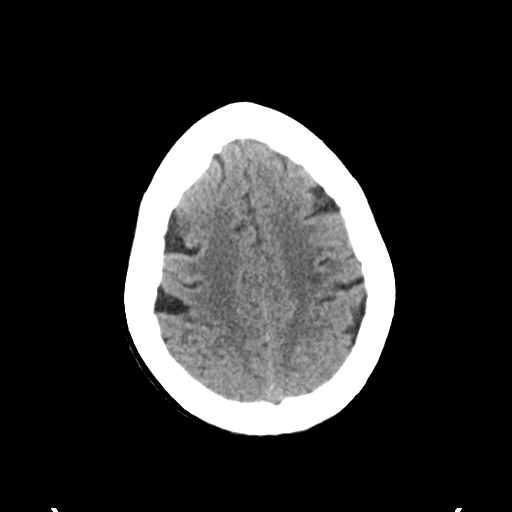
[im 27/33  brain]
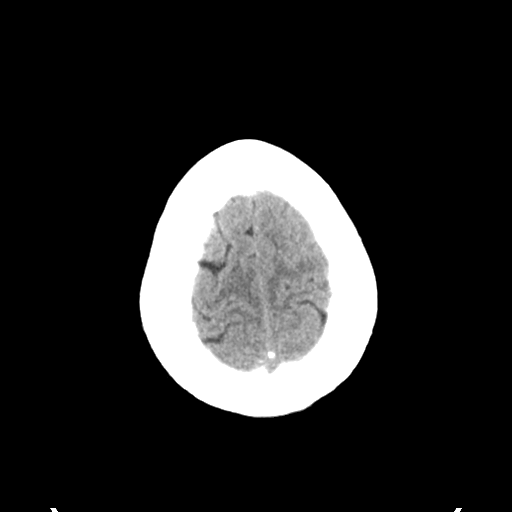
[im 30/33  brain]
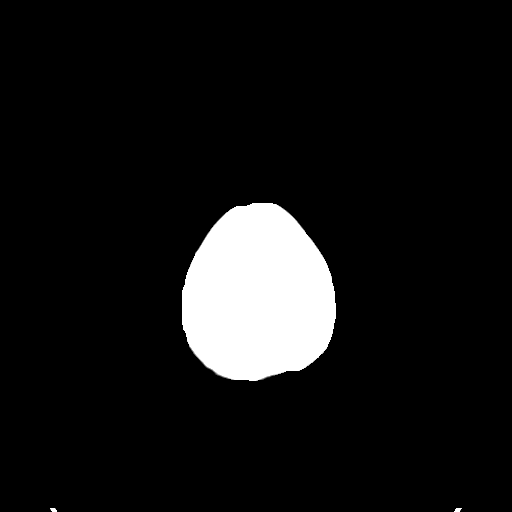
[im 30/33  bone]
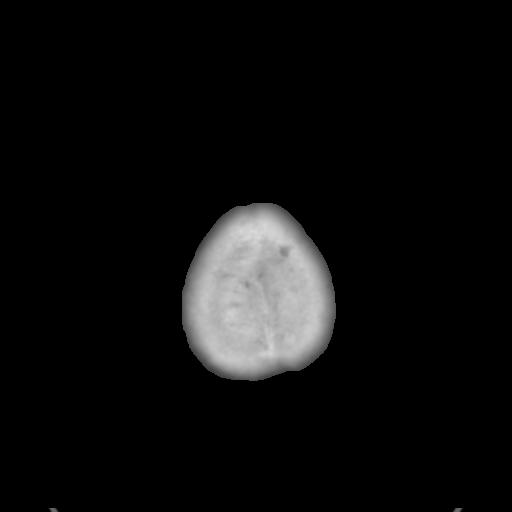

[Series 4: coronal soft tissue · coronal · 0.35mm/px · 3 of 68 slices shown]
[im 23/68  brain]
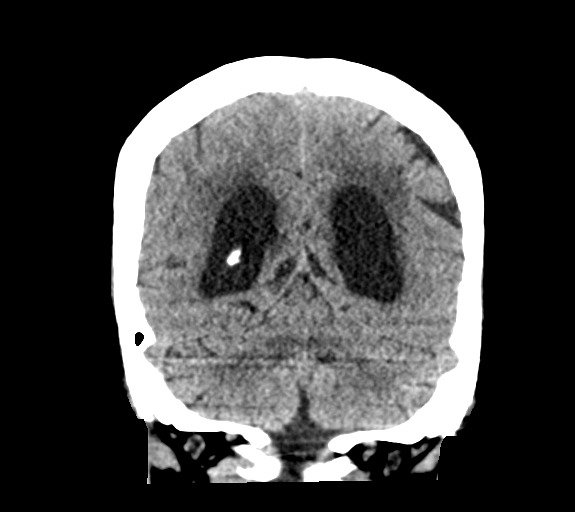
[im 30/68  brain]
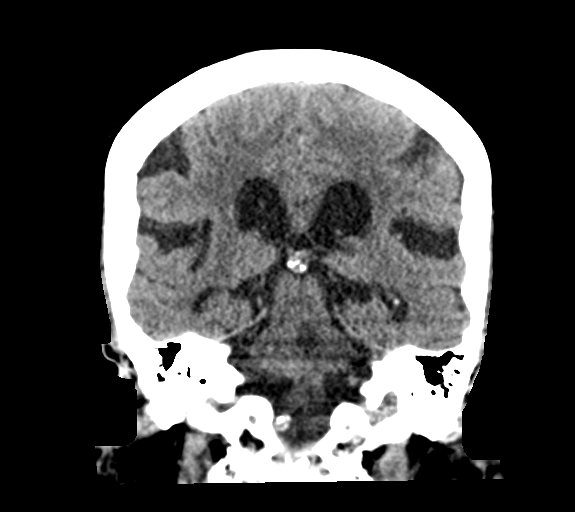
[im 38/68  brain]
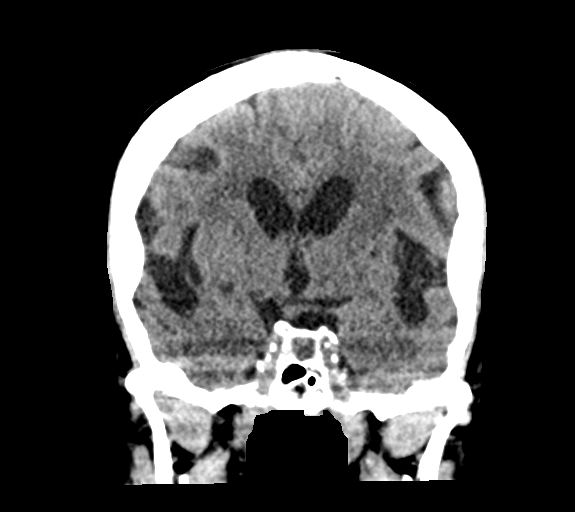

[Series 5: sagittal soft tissue · sagittal · 0.32mm/px · 3 of 47 slices shown]
[im 16/47  brain]
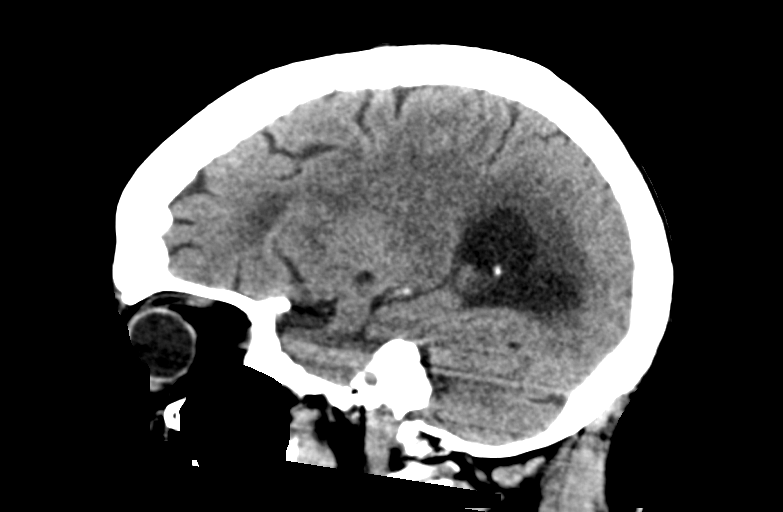
[im 24/47  brain]
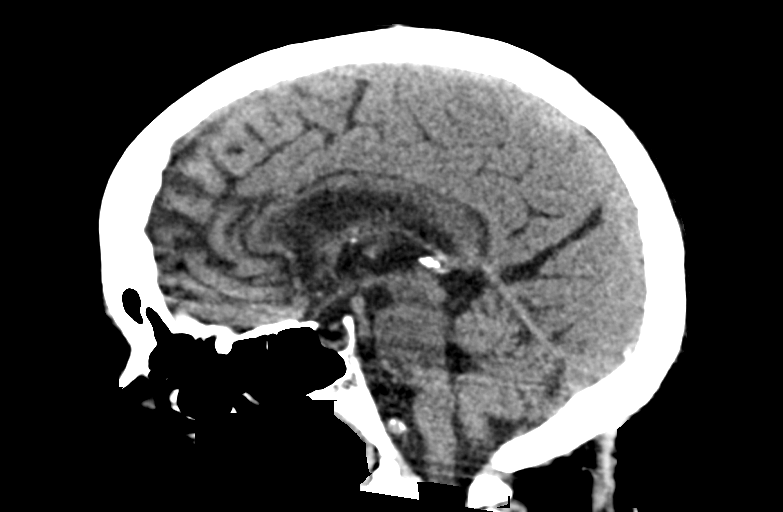
[im 31/47  brain]
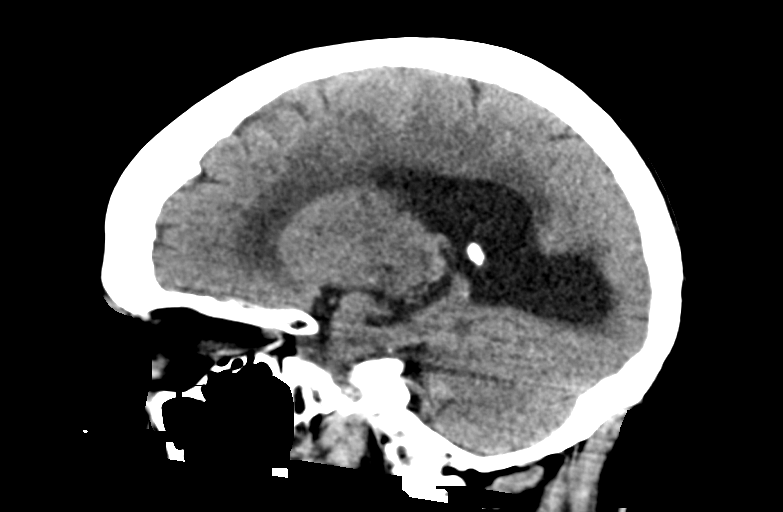

[15 of 47 positions shown; findings below may reference images not displayed]

FINDINGS: Brain: Moderate to advanced atrophy. Chronic microvascular ischemic
change throughout the white matter. Ischemic changes in the internal
capsule bilaterally. Acute infarct posterior limb internal capsule
left best seen on prior MRI.

Prominent hypodensity in the left paracentral pons. This was present
on the prior CT. No restricted diffusion on prior MRI. This likely
represents subacute infarction which has occurred since the prior
MRI.

Vascular: Negative for hyperdense vessel

Skull: Negative

Sinuses/Orbits: Negative

Other: None
IMPRESSION: Generalized atrophy and moderate to extensive chronic ischemic
change

Prominent hypodensity in the left pons unchanged from the recent CT.
Probable subacute infarct. Follow-up MRI could confirm.

## 2020-03-14 MED ORDER — PANTOPRAZOLE SODIUM 40 MG PO PACK
40.0000 mg | PACK | Freq: Every day | ORAL | Status: DC
  Administered 2020-03-14 – 2020-04-27 (×44): 40 mg
  Filled 2020-03-14 (×45): qty 20

## 2020-03-14 MED ORDER — AMLODIPINE BESYLATE 10 MG PO TABS
10.0000 mg | ORAL_TABLET | Freq: Every day | ORAL | Status: DC
  Administered 2020-03-15 – 2020-04-04 (×19): 10 mg
  Filled 2020-03-14 (×20): qty 1

## 2020-03-14 MED ORDER — ACETAMINOPHEN 650 MG RE SUPP: 650.0000 mg | Freq: Four times a day (QID) | RECTAL | Status: DC | PRN

## 2020-03-14 MED ORDER — HEPARIN SODIUM (PORCINE) 5000 UNIT/ML IJ SOLN
5000.0000 [IU] | Freq: Three times a day (TID) | INTRAMUSCULAR | Status: AC
  Administered 2020-03-14 – 2020-03-30 (×51): 5000 [IU] via SUBCUTANEOUS
  Filled 2020-03-14 (×51): qty 1

## 2020-03-14 MED ORDER — HYDRALAZINE HCL 25 MG PO TABS
25.0000 mg | ORAL_TABLET | Freq: Four times a day (QID) | ORAL | Status: DC | PRN
  Administered 2020-03-20: 25 mg
  Filled 2020-03-14 (×2): qty 1

## 2020-03-14 MED ORDER — ACETAMINOPHEN 160 MG/5ML PO SOLN
650.0000 mg | Freq: Four times a day (QID) | ORAL | Status: DC | PRN
  Administered 2020-03-25 – 2020-04-17 (×8): 650 mg
  Filled 2020-03-14 (×9): qty 20.3

## 2020-03-14 MED ORDER — METOPROLOL TARTRATE 25 MG/10 ML ORAL SUSPENSION
25.0000 mg | Freq: Two times a day (BID) | ORAL | Status: DC
  Administered 2020-03-14 – 2020-03-18 (×8): 25 mg
  Filled 2020-03-14 (×9): qty 10

## 2020-03-14 MED ORDER — CLOPIDOGREL BISULFATE 75 MG PO TABS
75.0000 mg | ORAL_TABLET | Freq: Every day | ORAL | Status: DC
  Administered 2020-03-15 – 2020-03-26 (×12): 75 mg
  Filled 2020-03-14 (×12): qty 1

## 2020-03-14 MED ORDER — ROSUVASTATIN CALCIUM 5 MG PO TABS
5.0000 mg | ORAL_TABLET | Freq: Every day | ORAL | Status: DC
  Administered 2020-03-14 – 2020-03-16 (×3): 5 mg via ORAL
  Filled 2020-03-14 (×4): qty 1

## 2020-03-14 NOTE — Progress Notes (Addendum)
PROGRESS NOTE    Tasha [name]  MGQ:[id number] DOB: 05-Oct-1945 DOA: 03/08/2020 PCP: Patient, No Pcp Per   Chief Complaint  Patient presents with  . Emesis    Brief Narrative [name] [name] 74 y.o.femalewith medical history significant ofstroke, T2DM, HTN, MI who was recently discharged 01/2020 after Tyr Franca stroke workup who represented on day of admission with nausea/vomiting.   History was limited due to the patient's mental status. She says "I don't know" when asked why she is here. Hx obtained from pt, staff, [facility], and chart. 2 days of nausea and vomiting which has progressively gotten worse. Staff reports progression to brown emesis. She denies fevers, chest pain. Endorses nausea, vomiting, and abdominal pain. Her son says at baseline, she's alert and oriented.   ED Course:Noted to have significant lactic acidosis and AKI. IVF, labs, EKG, imaging, antibiotics. Hospitalist to admit for aki/lactic acidosis and possible sepsis. Nephrology was c/s by the ED.   She was admitted with acute metabolic encephalopathy, nausea, vomiting, and concerning for sepsis with [name] significant AGMA, lactic acidosis, and acute kidney injury on presentation.  She's gradually improved with supportive care with IVF.  Renal was c/s, but now signed off.  MRI obtained due to R sided weakness which was worse than previously documented in prior hospitalization which was positive for acute L pontine infarct and acute small infarct along posterior body of R lateral ventricle.  [facility]rology recommending [facility] for [facility] evaluation.   Assessment & Plan:   Active Problems:   CVA (cerebral vascular accident) (HCC)   DM (diabetes mellitus), type 2 with peripheral vascular complications (HCC)   Essential (primary) hypertension   Lactic acidosis   Nausea and vomiting   AKI (acute kidney injury) (HCC)   Hyperkalemia   Sepsis (HCC)   Coffee ground emesis  Acute CVA  Hx CVA:  Patient with  recent stroke discharged on 02/19/2020 was supposed to be on dual antiplatelet therapy x3 weeks then Plavix alone.   Patient noted to need [name] repeat MRI in 3 months as well as 6-week follow-up with neurology.  Patient does have [name] chronic persistent right-sided weakness.  Statin on hold.  Resume plavix. PT/OT Right sided weakness has been persistent and significant (per therapy notes "much different") will follow MRI given lack of imrpovement (MRI appears to be down? Will follow repeat head CT and obtain MRI when able) Addendum: mri with acute L pontine infarct and acute small infarct along the posterior body of the R lateral ventricle - discussed with neurology, recommended transfer to [facility] for further evaluation by neurology  Acute Kidney Injury Unclear etiology, suspected prerenal - possibly related to sepsis, ? Volume/hemodynamically mediated with nausea/vomiting on presentation. Renal US without hydro, CT without hydro Creatinine with modest improvement - 5.48 today Adequate UOP, foley d/c'd 7/14 - stable Continue IVF Renal c/s, appreciate recs   Concern for Sepsis  Leukocytosis  Hypothermia: hypothermia, leukocytosis, lactic acidosis at presentation, concerning for sepsis No clear source identified  CXR with atelectasis on 7/13 CT abd/pelvis without acute findings, RUQ [place] without evidence of cholecystitis UA not suggestive of UTI - urine cx no growth Blood cx NGTD x2, MRSA PCR negative Leukocytosis improving, will continue to follow  Vanc/cefepime -> will deescalate to ceftriaxone - plan for 7 total days abx  Acute Metabolic Encephalopathy 2/2 above, uremia, possible infection - gradually improving - seemed sleepy this AM, less interactive Renal following for uremia with aki, renal function slowly improving Continue abx for possible infection  TSH wnl, B12 wnl, folate wnl Head CT with L posterior internal capsule infarct, no new areas of infarction Repeat CT pending, planning for  MRI when able Delirium precautions   AGMA  Lactic Acidosis: resolved.  Would discontinue metformin on discharge.   Nausea  Vomiting  Coffee Ground Emesis:  Coffee ground emesis noted on day of admission around 1949 (see nursing note) hemoccult was negative - ? Mallory weiss Hemoglobin downtrending, but suspect 2/2 dilution  Consider GI consultation if recurrent or significantly downtrending Hb Hold plavix and DVT ppx - resumed plavix 7/17, will restart DVT ppx and follow Elevated lipase, follow repeat (downtrending) Oral PPI  Dysphagia: nectar thick liquid per speech  7/15 panda tube placed Tube feeds started Per speech, she's improving - currently getting tube feeds, will decrease tube feed rate and advance diet to fulls to encourage PO intake  Hyperkalemia  Hypokalemia: now hypokalemic -> replace conservatively with AKI - per renal  T2DM:  A1c 6.9 01/2020 SSI, add levemir with tube feeds  Hypertension:  Holding norvasc, HCTZ, metoprolol  SVT: noted 7/13 PM, resolved.  Will continue to follow telemetry.  TSH wnl.    Elevated troponin: mild x1, in setting of AKI, asymptomatic.  Recheck if symptomatic.  Suspect demand.      DVT prophylaxis: SCD Code Status: full  Family Communication: none at bedside - called son 7/17 Disposition:   Status is: Inpatient  Remains inpatient appropriate because:Inpatient level of care appropriate due to severity of illness   Dispo: The patient is from: SNF              Anticipated d/c is to: pending              Anticipated d/c date is: > 3 days              Patient currently is not medically stable to d/c.   Consultants:   renal  Procedures:   none  Antimicrobials: Anti-infectives (From admission, onward)   Start     Dose/Rate Route Frequency Ordered Stop   03/10/20 1200  cefTRIAXone (ROCEPHIN) 2 g in sodium chloride 0.9 % 100 mL IVPB     Discontinue     2 g 200 mL/hr over 30 Minutes Intravenous Every 24 hours 03/10/20 0924  03/15/20 1159   03/09/20 1000  ceFEPIme (MAXIPIME) 1 g in sodium chloride 0.9 % 100 mL IVPB  Status:  Discontinued        1 g 200 mL/hr over 30 Minutes Intravenous Every 24 hours 03/08/20 1219 03/10/20 0924   03/08/20 1219  vancomycin variable dose per unstable renal function (pharmacist dosing)  Status:  Discontinued         Does not apply See admin instructions 03/08/20 1219 03/10/20 0906   03/08/20 1100  vancomycin (VANCOCIN) IVPB 1000 mg/200 mL premix        1,000 mg 200 mL/hr over 60 Minutes Intravenous  Once 03/08/20 1046 03/08/20 1406   03/08/20 1100  ceFEPIme (MAXIPIME) 2 g in sodium chloride 0.9 % 100 mL IVPB        2 g 200 mL/hr over 30 Minutes Intravenous  Once 03/08/20 1046 03/08/20 1211     Subjective: Doesn't say much this AM Less interactive  Objective: Vitals:   03/14/20 0655 03/14/20 0700 03/14/20 0800 03/14/20 0900  BP: (!) 131/59 (!) 130/59 133/64 (!) 158/55  Pulse: 67 70 66 72  Resp: 11 14 12 12   Temp:   98.8 F (37.1 C)  TempSrc:   Axillary   SpO2: 96% 95% 96% 95%  Weight:      Height:        Intake/Output Summary (Last 24 hours) at 03/14/2020 0908 Last data filed at 03/14/2020 0800 Gross per 24 hour  Intake 1491.45 ml  Output 1225 ml  Net 266.45 ml   Filed Weights   03/12/20 0425 03/13/20 0500 03/14/20 0500  Weight: 63.8 kg 64.6 kg 63.8 kg    Examination:  General: No acute distress. Cardiovascular: Heart sounds show Tayten Heber regular rate, and rhythm. Lungs: Clear to auscultation bilaterally  Abdomen: Soft, nontender, nondistended Neurological:  Alert, looks at me, but disoriented, does not say much, less interactive.  Doesn't follow commands consistently. Skin: Warm and dry. No rashes or lesions. Extremities: No clubbing or cyanosis. No edema.   Data Reviewed: I have personally reviewed following labs and imaging studies  CBC: Recent Labs  Lab 03/10/20 0229 03/11/20 0252 03/12/20 0250 03/13/20 0251 03/14/20 0335  WBC 11.6* 11.5* 12.6*  12.4* 14.5*  NEUTROABS 8.9* 8.8* 9.8* 9.5* 10.7*  HGB 10.0* 10.5* 12.0 11.9* 11.9*  HCT 28.9* 31.2* 36.0 35.7* 35.2*  MCV 82.6 85.5 86.5 86.7 86.3  PLT 201 180 158 160 180    Basic Metabolic Panel: Recent Labs  Lab 03/10/20 0229 03/10/20 1547 03/10/20 2319 03/11/20 0252 03/12/20 0250 03/13/20 0251 03/14/20 0335  NA 140   < > 139 137 137 134* 139  K 2.8*   < > 3.4* 3.4* 4.2 3.7 3.7  CL 81*   < > 84* 86* 89* 93* 99  CO2 31   < > 32 32 27 26 26   GLUCOSE 150*   < > 156* 152* 163* 311* 180*  BUN 143*   < > 124* 112* 103* 99* 84*  CREATININE 8.17*  8.05*   < > 7.75* 7.72* 7.09* 6.18* 5.49*  CALCIUM 6.5*   < > 6.4* 6.7* 7.5* 7.9* 8.6*  MG  --    < > 1.6* 1.5* 2.6* 2.2 1.9  PHOS 6.5*  --   --  4.2 3.9 3.7  3.7 3.0  2.8   < > = values in this interval not displayed.    GFR: Estimated Creatinine Clearance: 7.9 mL/min (Devonte Migues) (by C-G formula based on SCr of 5.49 mg/dL (H)).  Liver Function Tests: Recent Labs  Lab 03/09/20 0233 03/10/20 0229 03/10/20 2319 03/11/20 0252 03/12/20 0250 03/13/20 0251 03/14/20 0335  AST 17  --  32 33 38  --  23  ALT 18  --  19 19 21   --  25  ALKPHOS 47  --  50 50 61  --  60  BILITOT 0.8  --  1.2 0.9 0.9  --  0.3  PROT 5.7*  --  5.5* 5.4* 6.2*  --  6.2*  ALBUMIN 3.2*   < > 2.8* 2.8* 3.0* 2.9* 2.8*  2.8*   < > = values in this interval not displayed.    CBG: Recent Labs  Lab 03/13/20 0745 03/13/20 1616 03/13/20 2006 03/13/20 2340 03/14/20 0419  GLUCAP 257* 276* 214* 192* 180*     Recent Results (from the past 240 hour(s))  SARS Coronavirus 2 by RT PCR (hospital order, performed [facility]) Nasopharyngeal Nasopharyngeal Swab     Status: None   Collection Time: 03/08/20  9:00 AM   Specimen: Nasopharyngeal Swab  Result Value Ref Range Status   SARS Coronavirus 2 NEGATIVE NEGATIVE Final    Comment: (NOTE) SARS-CoV-2 target  nucleic acids are NOT DETECTED.  The SARS-CoV-2 RNA is generally detectable in upper and  lower respiratory specimens during the acute phase of infection. The lowest concentration of SARS-CoV-2 viral copies this assay can detect is 250 copies / mL. Lamount Bankson negative result does not preclude SARS-CoV-2 infection and should not be used as the sole basis for treatment or other patient management decisions.  Adryana Mogensen negative result may occur with improper specimen collection / handling, submission of specimen other than nasopharyngeal swab, presence of viral mutation(s) within the areas targeted by this assay, and inadequate number of viral copies (<250 copies / mL). Om Lizotte negative result must be combined with clinical observations, patient history, and epidemiological information.  Fact Sheet for Patients:   BoilerBrush.com.cy  Fact Sheet for Healthcare Providers: https://pope.com/  This test is not yet approved or  cleared by the [place] FDA and has been authorized for detection and/or diagnosis of SARS-CoV-2 by FDA under an Emergency Use Authorization (EUA).  This EUA will remain in effect (meaning this test can be used) for the duration of the COVID-19 declaration under Section 564(b)(1) of the Act, 21 U.S.C. section 360bbb-3(b)(1), unless the authorization is terminated or revoked sooner.  Performed at [facility], [address] [place], [place] [address]   Culture, Urine     Status: None   Collection Time: 03/08/20 10:20 AM   Specimen: Urine, Random  Result Value Ref Range Status   Specimen Description   Final    URINE, RANDOM Performed at [facility], [address], [place], [place] [address]    Special Requests   Final    NONE Performed at [facility], [address] [place], [place] [address]    Culture   Final    NO GROWTH Performed at [facility], [address] [place], [place] [address]    Report Status 03/10/2020 FINAL  Final  Blood culture  (routine x 2)     Status: None   Collection Time: 03/08/20 11:45 AM   Specimen: BLOOD  Result Value Ref Range Status   Specimen Description   Final    BLOOD RIGHT ANTECUBITAL Performed at [facility], [address], [place], [place] [address]    Special Requests   Final    BOTTLES DRAWN AEROBIC AND ANAEROBIC Blood Culture adequate volume Performed at [facility], [address], [place], [place] [address]    Culture   Final    NO GROWTH 5 DAYS Performed at [facility], [address] [place], [place] [address]    Report Status 03/13/2020 FINAL  Final  Blood culture (routine x 2)     Status: None   Collection Time: 03/08/20 12:05 PM   Specimen: BLOOD  Result Value Ref Range Status   Specimen Description   Final    BLOOD LEFT WRIST Performed at [facility], [address], [place], [place] [address]    Special Requests   Final    BOTTLES DRAWN AEROBIC AND ANAEROBIC Blood Culture adequate volume Performed at [facility], [address] [place], [place] [address]    Culture   Final    NO GROWTH 5 DAYS Performed at [facility], [address] [place], [place] [address]    Report Status 03/13/2020 FINAL  Final  MRSA PCR Screening     Status: None   Collection Time: 03/08/20  1:47 PM  Specimen: Nasopharyngeal  Result Value Ref Range Status   MRSA by PCR NEGATIVE NEGATIVE Final    Comment:        The GeneXpert MRSA Assay (FDA approved for NASAL specimens only), is one component of Elizjah Noblet comprehensive MRSA colonization surveillance program. It is not intended to diagnose MRSA infection nor to guide or monitor treatment for MRSA infections. Performed at [facility], [address] [place], [place] [address]   MRSA PCR Screening     Status: None   Collection Time: 03/13/20 10:42 PM   Specimen: Nasopharyngeal  Result Value Ref Range Status   MRSA by PCR  NEGATIVE NEGATIVE Final    Comment:        The GeneXpert MRSA Assay (FDA approved for NASAL specimens only), is one component of Dastan Krider comprehensive MRSA colonization surveillance program. It is not intended to diagnose MRSA infection nor to guide or monitor treatment for MRSA infections. Performed at [facility], [address], [place], [place] [address]          Radiology Studies: DG Abd 1 View  Result Date: 03/12/2020 CLINICAL DATA:  NG tube placement EXAM: ABDOMEN - 1 VIEW COMPARISON:  03/12/2020 FINDINGS: Weighted enteric feeding tube remains in nearly unchanged position, folded in the gastric fundus with tip directed toward the gastroesophageal junction. IMPRESSION: Weighted enteric feeding tube remains in nearly unchanged position, folded in the gastric fundus with tip directed toward the gastroesophageal junction. Electronically Signed   By: Lauralyn Primes M.D.   On: 03/12/2020 12:19   DG INTRO LONG GI TUBE  Result Date: 03/12/2020 CLINICAL DATA:  Feeding tube advancement. EXAM: FL FEEDING TUBE PLACEMENT CONTRAST:  Approximately 50 cc of Omnipaque FLUOROSCOPY TIME:  Fluoroscopy Time:  4 minutes and 42 seconds Radiation Exposure Index (if provided by the fluoroscopic device): 46.1 mGy Number of Acquired Spot Images: 0 COMPARISON:  Abdominal film of earlier in the day FINDINGS: An Amplatz wire was placed in the feeding tube. The feeding tube was advanced the level of the ligament of Treitz and location confirmed with Isobella Ascher small amount of contrast. IMPRESSION: Feeding tube advancement into the distal duodenum. Electronically Signed   By: Jeronimo Greaves M.D.   On: 03/12/2020 16:06        Scheduled Meds: . amLODipine  10 mg Oral Daily  . chlorhexidine  15 mL Mouth Rinse BID  . Chlorhexidine Gluconate Cloth  6 each Topical Daily  . clopidogrel  75 mg Oral Daily  . feeding supplement (OSMOLITE 1.2 CAL)  1,000 mL Per Tube Q24H  . free water  100 mL Per Tube Q6H  .  insulin aspart  0-9 Units Subcutaneous Q4H  . insulin detemir  4 Units Subcutaneous BID  . mouth rinse  15 mL Mouth Rinse BID  . metoprolol tartrate  25 mg Oral BID  . pantoprazole  40 mg Oral Daily   Continuous Infusions: . sodium chloride 250 mL (03/10/20 2211)  . cefTRIAXone (ROCEPHIN)  IV Stopped (03/13/20 1456)     LOS: 6 days    Time spent: over 30 min    Lacretia Nicks, MD [facility]   To contact the attending provider between 7A-7P or the covering provider during after hours 7P-7A, please log into the web site [email or web address] and access using universal [place] password for that web site. If you do not have the password, please call the hospital operator.  03/14/2020, 9:08 AM

## 2020-03-14 NOTE — Progress Notes (Signed)
Patient ID: Tasha [name], female   DOB: 1946/07/03, 74 y.o.   MRN: [id number] [facility] Progress Note   Assessment/ Plan:   1. Acute kidney Injury: Nonoliguric,acute injury thought to have been from sustained prerenal state with evolution to ATN (clinically suspected with component of sepsis). Renal function continues to show improvement with downtrending BUN/creatinine and decent urine output without acute HD indication. 2.  Possible sepsis: With leukocytosis/hypothermia and acute kidney injury.  Initially on broad-spectrum antimicrobial coverage with vancomycin and cefepime that has been narrowed down to ceftriaxone as blood and urine culture so far have been negative.  Low-level leukocytosis persists. 3.  Altered mental status: Likely secondary to sepsis versus azotemia-improving. 4.  History of CVA 5.  Hypertension: Elevated blood pressures noted, antihypertensive therapy as adjusted by primary service.  Renal service will sign off at this time, please call with questions or concerns.  She can follow-up with her primary care provider upon discharge for subsequent labs.  Subjective:   No acute events overnight.  She does not voice any complaints-denies any chest pain or shortness of breath.   Objective:   BP (!) 170/71 Comment: rechecked; parameters not met for PRN medication  Pulse 65   Temp 98.8 F (37.1 C) (Axillary)   Resp 14   Ht '5\' 4"'$  (1.626 m)   Wt 63.8 kg   SpO2 96%   BMI 24.14 kg/m   Intake/Output Summary (Last 24 hours) at 03/14/2020 1138 Last data filed at 03/14/2020 0800 Gross per 24 hour  Intake 1221.42 ml  Output 1225 ml  Net -3.58 ml   Weight change: -0.8 kg  Physical Exam: Gen: Appears comfortable resting in bed, just changed/cleaned CVS: Pulse regular rhythm and normal rate, S1 and S2 normal Resp: Clear to auscultation, no distinct rales or rhonchi Abd: Soft, obese, nontender Ext: No lower extremity edema  Imaging: DG INTRO LONG GI  TUBE  Result Date: 03/12/2020 CLINICAL DATA:  Feeding tube advancement. EXAM: FL FEEDING TUBE PLACEMENT CONTRAST:  Approximately 50 cc of Omnipaque FLUOROSCOPY TIME:  Fluoroscopy Time:  4 minutes and 42 seconds Radiation Exposure Index (if provided by the fluoroscopic device): 46.1 mGy Number of Acquired Spot Images: 0 COMPARISON:  Abdominal film of earlier in the day FINDINGS: An Amplatz wire was placed in the feeding tube. The feeding tube was advanced the level of the ligament of Treitz and location confirmed with a small amount of contrast. IMPRESSION: Feeding tube advancement into the distal duodenum. Electronically Signed   By: Abigail Miyamoto M.D.   On: 03/12/2020 16:06    Labs: BMET Recent Labs  Lab 03/10/20 0229 03/10/20 1547 03/10/20 2319 03/11/20 0252 03/12/20 0250 03/13/20 0251 03/14/20 0335  NA 140 141 139 137 137 134* 139  K 2.8* 3.4* 3.4* 3.4* 4.2 3.7 3.7  CL 81* 82* 84* 86* 89* 93* 99  CO2 31 35* 32 32 '27 26 26  '$ GLUCOSE 150* 135* 156* 152* 163* 311* 180*  BUN 143* 130* 124* 112* 103* 99* 84*  CREATININE 8.17*  8.05* 7.99* 7.75* 7.72* 7.09* 6.18* 5.49*  CALCIUM 6.5* 6.4* 6.4* 6.7* 7.5* 7.9* 8.6*  PHOS 6.5*  --   --  4.2 3.9 3.7  3.7 3.0  2.8   CBC Recent Labs  Lab 03/11/20 0252 03/12/20 0250 03/13/20 0251 03/14/20 0335  WBC 11.5* 12.6* 12.4* 14.5*  NEUTROABS 8.8* 9.8* 9.5* 10.7*  HGB 10.5* 12.0 11.9* 11.9*  HCT 31.2* 36.0 35.7* 35.2*  MCV 85.5 86.5 86.7 86.3  PLT  180 158 160 180    Medications:    . [START ON 03/15/2020] amLODipine  10 mg Per Tube Daily  . chlorhexidine  15 mL Mouth Rinse BID  . Chlorhexidine Gluconate Cloth  6 each Topical Daily  . [START ON 03/15/2020] clopidogrel  75 mg Per Tube Daily  . feeding supplement (OSMOLITE 1.2 CAL)  1,000 mL Per Tube Q24H  . free water  100 mL Per Tube Q6H  . heparin injection (subcutaneous)  5,000 Units Subcutaneous Q8H  . insulin aspart  0-9 Units Subcutaneous Q4H  . insulin detemir  4 Units Subcutaneous  BID  . mouth rinse  15 mL Mouth Rinse BID  . metoprolol tartrate  25 mg Per Tube BID  . pantoprazole sodium  40 mg Per Tube Daily   Elmarie Shiley, MD 03/14/2020, 11:38 AM

## 2020-03-14 NOTE — Progress Notes (Signed)
RN attempted to call report to 5W RN receiving pt. Unable to take report at this time; will call back.

## 2020-03-14 NOTE — Progress Notes (Signed)
Report called to [place], RN on 5W. All questions answered at this time. All pt belongings and chart transferred with pt. Pt was taken upstairs by RN and NT on tele. 5W RN will continue to monitor pt.

## 2020-03-15 ENCOUNTER — Inpatient Hospital Stay (HOSPITAL_COMMUNITY)
Admission: EM | Admit: 2020-03-15 | Discharge: 2020-03-15 | Disposition: A | Discharge status: Home / Self Care | Payer: Medicare Other | Source: Home / Self Care | Attending: Neurology | Admitting: Neurology

## 2020-03-15 DIAGNOSIS — R4182 Altered mental status, unspecified: Secondary | ICD-10-CM

## 2020-03-15 DIAGNOSIS — R569 Unspecified convulsions: Secondary | ICD-10-CM

## 2020-03-15 DIAGNOSIS — I639 Cerebral infarction, unspecified: Secondary | ICD-10-CM

## 2020-03-15 LAB — CBC WITH DIFFERENTIAL/PLATELET
Abs Immature Granulocytes: 0.12 10*3/uL — ABNORMAL HIGH (ref 0.00–0.07)
Basophils Absolute: 0 10*3/uL (ref 0.0–0.1)
Basophils Relative: 0 %
Eosinophils Absolute: 0.7 10*3/uL — ABNORMAL HIGH (ref 0.0–0.5)
Eosinophils Relative: 6 %
HCT: 31.5 % — ABNORMAL LOW (ref 36.0–46.0)
Hemoglobin: 10.4 g/dL — ABNORMAL LOW (ref 12.0–15.0)
Immature Granulocytes: 1 %
Lymphocytes Relative: 10 %
Lymphs Abs: 1.2 10*3/uL (ref 0.7–4.0)
MCH: 28.9 pg (ref 26.0–34.0)
MCHC: 33 g/dL (ref 30.0–36.0)
MCV: 87.5 fL (ref 80.0–100.0)
Monocytes Absolute: 0.9 10*3/uL (ref 0.1–1.0)
Monocytes Relative: 7 %
Neutro Abs: 9.3 10*3/uL — ABNORMAL HIGH (ref 1.7–7.7)
Neutrophils Relative %: 76 %
Platelets: 147 10*3/uL — ABNORMAL LOW (ref 150–400)
RBC: 3.6 MIL/uL — ABNORMAL LOW (ref 3.87–5.11)
RDW: 13.7 % (ref 11.5–15.5)
WBC: 12.3 10*3/uL — ABNORMAL HIGH (ref 4.0–10.5)
nRBC: 0 % (ref 0.0–0.2)

## 2020-03-15 LAB — GLUCOSE, CAPILLARY
Glucose-Capillary: 168 mg/dL — ABNORMAL HIGH (ref 70–99)
Glucose-Capillary: 176 mg/dL — ABNORMAL HIGH (ref 70–99)
Glucose-Capillary: 185 mg/dL — ABNORMAL HIGH (ref 70–99)
Glucose-Capillary: 195 mg/dL — ABNORMAL HIGH (ref 70–99)
Glucose-Capillary: 197 mg/dL — ABNORMAL HIGH (ref 70–99)
Glucose-Capillary: 224 mg/dL — ABNORMAL HIGH (ref 70–99)

## 2020-03-15 LAB — RENAL FUNCTION PANEL
Albumin: 2.7 g/dL — ABNORMAL LOW (ref 3.5–5.0)
Anion gap: 11 (ref 5–15)
BUN: 75 mg/dL — ABNORMAL HIGH (ref 8–23)
CO2: 27 mmol/L (ref 22–32)
Calcium: 8.7 mg/dL — ABNORMAL LOW (ref 8.9–10.3)
Chloride: 99 mmol/L (ref 98–111)
Creatinine, Ser: 4.53 mg/dL — ABNORMAL HIGH (ref 0.44–1.00)
GFR calc Af Amer: 10 mL/min — ABNORMAL LOW (ref >60)
GFR calc non Af Amer: 9 mL/min — ABNORMAL LOW (ref >60)
Glucose, Bld: 185 mg/dL — ABNORMAL HIGH (ref 70–99)
Phosphorus: 3.9 mg/dL (ref 2.5–4.6)
Potassium: 3.5 mmol/L (ref 3.5–5.1)
Sodium: 137 mmol/L (ref 135–145)

## 2020-03-15 LAB — CREATININE, SERUM
Creatinine, Ser: 4.67 mg/dL — ABNORMAL HIGH (ref 0.44–1.00)
GFR calc Af Amer: 10 mL/min — ABNORMAL LOW (ref >60)
GFR calc non Af Amer: 9 mL/min — ABNORMAL LOW (ref >60)

## 2020-03-15 LAB — HEPATIC FUNCTION PANEL
ALT: 42 U/L (ref 0–44)
AST: 39 U/L (ref 15–41)
Albumin: 2.7 g/dL — ABNORMAL LOW (ref 3.5–5.0)
Alkaline Phosphatase: 51 U/L (ref 38–126)
Bilirubin, Direct: 0.1 mg/dL (ref 0.0–0.2)
Indirect Bilirubin: 0.1 mg/dL — ABNORMAL LOW (ref 0.3–0.9)
Total Bilirubin: 0.2 mg/dL — ABNORMAL LOW (ref 0.3–1.2)
Total Protein: 5.8 g/dL — ABNORMAL LOW (ref 6.5–8.1)

## 2020-03-15 LAB — PROTEIN / CREATININE RATIO, URINE
Creatinine, Urine: 42.84 mg/dL
Protein Creatinine Ratio: 2.31 mg/mg{Cre} — ABNORMAL HIGH (ref 0.00–0.15)
Total Protein, Urine: 99 mg/dL

## 2020-03-15 LAB — SEDIMENTATION RATE: Sed Rate: 27 mm/hr — ABNORMAL HIGH (ref 0–22)

## 2020-03-15 LAB — C-REACTIVE PROTEIN: CRP: 1.4 mg/dL — ABNORMAL HIGH (ref <1.0)

## 2020-03-15 MED ORDER — ASPIRIN EC 81 MG PO TBEC
81.0000 mg | DELAYED_RELEASE_TABLET | Freq: Every day | ORAL | Status: DC
  Administered 2020-03-15 – 2020-03-16 (×2): 81 mg via ORAL
  Filled 2020-03-15 (×2): qty 1

## 2020-03-15 MED ORDER — LIP MEDEX EX OINT
1.0000 "application " | TOPICAL_OINTMENT | CUTANEOUS | Status: DC | PRN
  Administered 2020-04-12: 1 via TOPICAL
  Filled 2020-03-15: qty 7
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
## 2020-03-15 NOTE — Procedures (Signed)
Patient Name: Tasha [name]  MRN: [id number]  Epilepsy Attending: Charlsie Quest  Referring Physician/Provider: Dr Caryl Pina Date: 03/15/2020 Duration: 25.50 mins  Patient history: 73yo F with ams and acute left pontine stroke. EEG to evaluate for seizure.   Level of alertness: Awake,/lethargic  AEDs during EEG study: None  Technical aspects: This EEG study was done with scalp electrodes positioned according to the 10-20 International system of electrode placement. Electrical activity was acquired at a sampling rate of 500Hz  and reviewed with a high frequency filter of 70Hz  and a low frequency filter of 1Hz . EEG data were recorded continuously and digitally stored.   Description: No posterior dominant rhythm. EEG showed continuous generalized 3 to 6 Hz theta-delta slowing as well as triphasic waves, generalized, maximal bifrontal. Hyperventilation and photic stimulation were not performed.     ABNORMALITY -Continuous slow, generalized -Triphasic waves, generalized  IMPRESSION: This study is suggestive of moderate diffuse encephalopathy, nonspecific etiology but likely related to toxic-metabolic etiology. No seizures or epileptiform discharges were seen throughout the recording.  [name] 

## 2020-03-15 NOTE — Progress Notes (Signed)
EEG completed, results pending. 

## 2020-03-15 NOTE — Care Management Important Message (Signed)
Important Message  Patient Details IM Letter given to Daryel Gerald SW Case Manager to present to the Patient Name: Tasha [name] MRN: [id number] Date of Birth: November 21, 1945   Medicare Important Message Given:  Yes     Caren Macadam 03/15/2020, 10:53 AM

## 2020-03-15 NOTE — Plan of Care (Signed)
°  Problem: Activity: Goal: Risk for activity intolerance will decrease Outcome: Not Progressing   Problem: Nutrition: Goal: Adequate nutrition will be maintained Outcome: Progressing
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
## 2020-03-15 NOTE — Progress Notes (Signed)
PROGRESS NOTE    Tasha [name]  FXT:[id number] DOB: April 01, 1946 DOA: 03/08/2020 PCP: Patient, No Pcp Per   Chief Complaint  Patient presents with  . Emesis    Brief Narrative Tasha [name] Rami Budhu 74 y.o.femalewith medical history significant ofstroke, T2DM, HTN, MI who was recently discharged 01/2020 after Tasha [name] stroke workup who represented on day of admission with nausea/vomiting.   History was limited due to the patient's mental status. She says "I don't know" when asked why she is here. Hx obtained from pt, staff, [facility], and chart. 2 days of nausea and vomiting which has progressively gotten worse. Staff reports progression to brown emesis. She denies fevers, chest pain. Endorses nausea, vomiting, and abdominal pain. Her son says at baseline, she's alert and oriented.   ED Course:Noted to have significant lactic acidosis and AKI. IVF, labs, EKG, imaging, antibiotics. Hospitalist to admit for aki/lactic acidosis and possible sepsis. Nephrology was c/s by the ED.   She was admitted with acute metabolic encephalopathy, nausea, vomiting, and concerning for sepsis with Bernell Haynie significant AGMA, lactic acidosis, and acute kidney injury on presentation.  She's gradually improved with supportive care with IVF.  Renal was c/s, but now signed off.  MRI obtained due to R sided weakness which was worse than previously documented in prior hospitalization which was positive for acute L pontine infarct and acute small infarct along posterior body of R lateral ventricle.  Neurology recommending transfer to [facility] for further evaluation.   Assessment & Plan:   Active Problems:   CVA (cerebral vascular accident) ([place])   DM (diabetes mellitus), type 2 with peripheral vascular complications ([place])   Essential (primary) hypertension   Lactic acidosis   Nausea and vomiting   AKI (acute kidney injury) ([place])   Hyperkalemia   Sepsis ([place])   Coffee ground emesis  Acute CVA  Hx CVA:  Patient with  recent stroke discharged on 02/19/2020 was supposed to be on dual antiplatelet therapy x3 weeks then Plavix alone.   Patient noted to need Tasha [name] repeat MRI in 3 months as well as 6-week follow-up with neurology.  Patient does have Tasha [name] chronic persistent right-sided weakness.  Statin on hold.  Resume plavix. PT/OT mri with acute L pontine infarct and acute small infarct along the posterior body of the R lateral ventricle  Neurology c/s -> recommending EEG, transfer to [facility], telemetry, continue aspirin/plavix, ESR/CRP, lupus panel (follow ANA, anti ds dna, C3, C4 pending).  Recommended discussion with renal regarding risk/benefits of renal bx given possibility of systemic small vessel vasculitis with recurrent small vessel strokes.   Acute Kidney Injury Unclear etiology, suspected prerenal - possibly related to sepsis, ? Volume/hemodynamically mediated with nausea/vomiting on presentation. Renal US without hydro, CT without hydro Creatinine with modest improvement - 4.53 today Adequate UOP, foley d/c'd 7/14 - stable Renal c/s, appreciate recs - asked renal to comment on risk benefits of renal bx given concern from neurology regarding systemic small vessel vasculitis - appreciate assistance  Acute Metabolic Encephalopathy 2/2 above, uremia, possible infection, stroke - was gradually improving, but recently waxing/waning - less responsive today, Tasha [name]&Ox1-2 today (knew hospital, said June - not able to specify WL) Appreciate neurology recommendations - follow EEG Renal following for uremia with aki, renal function slowly improving Continue abx for possible infection  TSH wnl, B12 wnl, folate wnl Head CT with L posterior internal capsule infarct, no new areas of infarction MRI with new stroke as noted above  Delirium precautions   Concern for Sepsis  Leukocytosis  Hypothermia: hypothermia, leukocytosis, lactic acidosis at presentation, concerning for sepsis No clear source identified  CXR with  atelectasis on 7/13 CT abd/pelvis without acute findings, RUQ [place] without evidence of cholecystitis UA not suggestive of UTI - urine cx no growth Blood cx NGTD x2, MRSA PCR negative Leukocytosis improving, will continue to follow  Vanc/cefepime -> will deescalate to ceftriaxone - plan for 7 total days abx (7/12-7/18)  AGMA  Lactic Acidosis: resolved.  Would discontinue metformin on discharge.   Nausea  Vomiting  Coffee Ground Emesis:  Coffee ground emesis noted on day of admission around [place] (see nursing note) hemoccult was negative - ? Mallory weiss Hemoglobin downtrending, but suspect 2/2 dilution  Consider GI consultation if recurrent or significantly downtrending Hb Hold plavix and DVT ppx - resumed plavix 7/17, started ASA 7/19.  DVT ppx restarted. Elevated lipase, follow repeat (downtrending) Oral PPI  Dysphagia: nectar thick liquid per speech  7/15 panda tube placed Tube feeds started Per speech, she's improving - will ask for reassessment with waxing/waning mental status 7/19  currently getting tube feeds, will decrease tube feed rate and advance diet to fulls to encourage PO intake  Hyperkalemia  Hypokalemia: now hypokalemic -> replace conservatively with AKI - per renal  T2DM:  A1c 6.9 01/2020 SSI, add levemir with tube feeds  Hypertension:  Amlodipine, metoprolol.  Holding HCTZ  SVT: noted 7/13 PM, resolved.  Will continue to follow telemetry.  TSH wnl.    Elevated troponin: mild x1, in setting of AKI, asymptomatic.  Recheck if symptomatic.  Suspect demand.      DVT prophylaxis: SCD Code Status: full  Family Communication: none at bedside - called son 7/18 Disposition:   Status is: Inpatient  Remains inpatient appropriate because:Inpatient level of care appropriate due to severity of illness   Dispo: The patient is from: SNF              Anticipated d/c is to: pending              Anticipated d/c date is: > 3 days              Patient currently is not  medically stable to d/c.   Consultants:   renal  Procedures:   none  Antimicrobials: Anti-infectives (From admission, onward)   Start     Dose/Rate Route Frequency Ordered Stop   03/10/20 1200  cefTRIAXone (ROCEPHIN) 2 g in sodium chloride 0.9 % 100 mL IVPB        2 g 200 mL/hr over 30 Minutes Intravenous Every 24 hours 03/10/20 0924 03/14/20 1333   03/09/20 1000  ceFEPIme (MAXIPIME) 1 g in sodium chloride 0.9 % 100 mL IVPB  Status:  Discontinued        1 g 200 mL/hr over 30 Minutes Intravenous Every 24 hours 03/08/20 1219 03/10/20 0924   03/08/20 1219  vancomycin variable dose per unstable renal function (pharmacist dosing)  Status:  Discontinued         Does not apply See admin instructions 03/08/20 1219 03/10/20 0906   03/08/20 1100  vancomycin (VANCOCIN) IVPB 1000 mg/200 mL premix        1,000 mg 200 mL/hr over 60 Minutes Intravenous  Once 03/08/20 1046 03/08/20 1406   03/08/20 1100  ceFEPIme (MAXIPIME) 2 g in sodium chloride 0.9 % 100 mL IVPB        2 g 200 mL/hr over 30 Minutes Intravenous  Once 03/08/20 1046 03/08/20 1211     Subjective: Persistently  less interactive Tanieka Pownall&Ox1-2 No complaints  Objective: Vitals:   03/14/20 2108 03/15/20 0114 03/15/20 0453 03/15/20 0900  BP: (!) 158/66 (!) 160/68 (!) 167/74 (!) 152/70  Pulse: 73 66 62 72  Resp: '20 19 19 17  '$ Temp: 98.1 F (36.7 C) 98 F (36.7 C) 98.2 F (36.8 C) 98.2 F (36.8 C)  TempSrc: Oral Oral Oral Oral  SpO2: 100% 98% 98% 96%  Weight:   62.4 kg   Height:        Intake/Output Summary (Last 24 hours) at 03/15/2020 1016 Last data filed at 03/15/2020 0900 Gross per 24 hour  Intake 385.5 ml  Output 900 ml  Net -514.5 ml   Filed Weights   03/13/20 0500 03/14/20 0500 03/15/20 0453  Weight: 64.6 kg 63.8 kg 62.4 kg    Examination:  General: No acute distress. Cardiovascular: Heart sounds show Broadus Costilla regular rate, and rhythm Lungs: Clear to auscultation bilaterally Abdomen: Soft, nontender, nondistended    Neurological: Alert and oriented 1-2. R sided weakness.  Skin: Warm and dry. No rashes or lesions. Extremities: No clubbing or cyanosis. No edema.  Data Reviewed: I have personally reviewed following labs and imaging studies  CBC: Recent Labs  Lab 03/11/20 0252 03/12/20 0250 03/13/20 0251 03/14/20 0335 03/15/20 0450  WBC 11.5* 12.6* 12.4* 14.5* 12.3*  NEUTROABS 8.8* 9.8* 9.5* 10.7* 9.3*  HGB 10.5* 12.0 11.9* 11.9* 10.4*  HCT 31.2* 36.0 35.7* 35.2* 31.5*  MCV 85.5 86.5 86.7 86.3 87.5  PLT 180 158 160 180 147*    Basic Metabolic Panel: Recent Labs  Lab 03/10/20 2319 03/10/20 2319 03/11/20 0252 03/12/20 0250 03/13/20 0251 03/14/20 0335 03/15/20 0450  NA 139   < > 137 137 134* 139 137  K 3.4*   < > 3.4* 4.2 3.7 3.7 3.5  CL 84*   < > 86* 89* 93* 99 99  CO2 32   < > 32 '27 26 26 27  '$ GLUCOSE 156*   < > 152* 163* 311* 180* 185*  BUN 124*   < > 112* 103* 99* 84* 75*  CREATININE 7.75*   < > 7.72* 7.09* 6.18* 5.49* 4.67*  4.53*  CALCIUM 6.4*   < > 6.7* 7.5* 7.9* 8.6* 8.7*  MG 1.6*  --  1.5* 2.6* 2.2 1.9  --   PHOS  --   --  4.2 3.9 3.7  3.7 3.0  2.8 3.9   < > = values in this interval not displayed.    GFR: Estimated Creatinine Clearance: 9.3 mL/min (Addalyn Speedy) (by C-G formula based on SCr of 4.67 mg/dL (H)).  Liver Function Tests: Recent Labs  Lab 03/10/20 2319 03/10/20 2319 03/11/20 0252 03/12/20 0250 03/13/20 0251 03/14/20 0335 03/15/20 0450  AST 32  --  33 38  --  23 39  ALT 19  --  19 21  --  25 42  ALKPHOS 50  --  50 61  --  60 51  BILITOT 1.2  --  0.9 0.9  --  0.3 0.2*  PROT 5.5*  --  5.4* 6.2*  --  6.2* 5.8*  ALBUMIN 2.8*   < > 2.8* 3.0* 2.9* 2.8*  2.8* 2.7*  2.7*   < > = values in this interval not displayed.    CBG: Recent Labs  Lab 03/14/20 1747 03/14/20 2112 03/14/20 2349 03/15/20 0448 03/15/20 0811  GLUCAP 153* 147* 155* 168* 197*     Recent Results (from the past 240 hour(s))  SARS Coronavirus 2 by RT PCR (  hospital order, performed in  [facility] hospital lab) Nasopharyngeal Nasopharyngeal Swab     Status: None   Collection Time: 03/08/20  9:00 AM   Specimen: Nasopharyngeal Swab  Result Value Ref Range Status   SARS Coronavirus 2 NEGATIVE NEGATIVE Final    Comment: (NOTE) SARS-CoV-2 target nucleic acids are NOT DETECTED.  The SARS-CoV-2 RNA is generally detectable in upper and lower respiratory specimens during the acute phase of infection. The lowest concentration of SARS-CoV-2 viral copies this assay can detect is 250 copies / mL. Rojean Ige negative result does not preclude SARS-CoV-2 infection and should not be used as the sole basis for treatment or other patient management decisions.  Mylah Baynes negative result may occur with improper specimen collection / handling, submission of specimen other than nasopharyngeal swab, presence of viral mutation(s) within the areas targeted by this assay, and inadequate number of viral copies (<250 copies / mL). Edna Rede negative result must be combined with clinical observations, patient history, and epidemiological information.  Fact Sheet for Patients:   StrictlyIdeas.no  Fact Sheet for Healthcare Providers: BankingDealers.co.za  This test is not yet approved or  cleared by the [place] FDA and has been authorized for detection and/or diagnosis of SARS-CoV-2 by FDA under an Emergency Use Authorization (EUA).  This EUA will remain in effect (meaning this test can be used) for the duration of the COVID-19 declaration under Section 564(b)(1) of the Act, 21 U.S.C. section 360bbb-3(b)(1), unless the authorization is terminated or revoked sooner.  Performed at [facility], [place] [address], [place], [place] [address]   Culture, Urine     Status: None   Collection Time: 03/08/20 10:20 AM   Specimen: Urine, Random  Result Value Ref Range Status   Specimen Description   Final    URINE, RANDOM Performed at [place] [address], [place], [place] [address]    Special Requests   Final    NONE Performed at [facility], [place] [address] [place], [place] [address]    Culture   Final    NO GROWTH Performed at [facility], [place] [address], [place], [place] [address]    Report Status 03/10/2020 FINAL  Final  Blood culture (routine x 2)     Status: None   Collection Time: 03/08/20 11:45 AM   Specimen: BLOOD  Result Value Ref Range Status   Specimen Description   Final    BLOOD RIGHT ANTECUBITAL Performed at [place] [address], [place], [place] [address]    Special Requests   Final    BOTTLES DRAWN AEROBIC AND ANAEROBIC Blood Culture adequate volume Performed at [place] [address] [place], [place] [address]    Culture   Final    NO GROWTH 5 DAYS Performed at [facility], [place] [address], [place], [place] [address]    Report Status 03/13/2020 FINAL  Final  Blood culture (routine x 2)     Status: None   Collection Time: 03/08/20 12:05 PM   Specimen: BLOOD  Result Value Ref Range Status   Specimen Description   Final    BLOOD LEFT WRIST Performed at [place] [address] [place], [place] [address]    Special Requests   Final    BOTTLES DRAWN AEROBIC AND ANAEROBIC Blood Culture adequate volume Performed at [place] [address], [place],  [address]    Culture  Final    NO GROWTH 5 DAYS Performed at [facility], [place] [address], [place], [place] [address]    Report Status 03/13/2020 FINAL  Final  MRSA PCR Screening     Status: None   Collection Time: 03/08/20  1:47 PM   Specimen: Nasopharyngeal  Result Value Ref Range Status   MRSA by PCR NEGATIVE NEGATIVE Final    Comment:        The GeneXpert MRSA Assay (FDA approved for NASAL specimens only), is one component of Ollin Hochmuth comprehensive MRSA colonization surveillance program.  It is not intended to diagnose MRSA infection nor to guide or monitor treatment for MRSA infections. Performed at [facility], [place] [address], [place], [place] [address]   MRSA PCR Screening     Status: None   Collection Time: 03/13/20 10:42 PM   Specimen: Nasopharyngeal  Result Value Ref Range Status   MRSA by PCR NEGATIVE NEGATIVE Final    Comment:        The GeneXpert MRSA Assay (FDA approved for NASAL specimens only), is one component of Shanell Aden comprehensive MRSA colonization surveillance program. It is not intended to diagnose MRSA infection nor to guide or monitor treatment for MRSA infections. Performed at [facility], [place] [address] [place] [address] [place], [place] [address]          Radiology Studies: CT HEAD WO CONTRAST  Result Date: 03/14/2020 CLINICAL DATA:  Focal neuro deficit greater than 6 hours. Altered mental status. Stroke. EXAM: CT HEAD WITHOUT CONTRAST TECHNIQUE: Contiguous axial images were obtained from the base of the skull through the vertex without intravenous contrast. COMPARISON:  CT head 03/08/2020.  MRI head 02/15/2020 FINDINGS: Brain: Moderate to advanced atrophy. Chronic microvascular ischemic change throughout the white matter. Ischemic changes in the internal capsule bilaterally. Acute infarct posterior limb internal capsule left best seen on prior MRI. Prominent hypodensity in the left paracentral pons. This was present on the prior CT. No restricted diffusion on prior MRI. This likely represents subacute infarction which has occurred since the prior MRI. Vascular: Negative for hyperdense vessel Skull: Negative Sinuses/Orbits: Negative Other: None IMPRESSION: Generalized atrophy and moderate to extensive chronic ischemic change Prominent hypodensity in the left pons unchanged from the recent CT. Probable subacute infarct. Follow-up MRI could confirm. Electronically Signed   By: Franchot Gallo M.D.   On: 03/14/2020 12:49   MR  BRAIN WO CONTRAST  Result Date: 03/14/2020 CLINICAL DATA:  Altered mental status, abnormal CT EXAM: MRI HEAD WITHOUT CONTRAST TECHNIQUE: Multiplanar, multiecho pulse sequences of the brain and surrounding structures were obtained without intravenous contrast. COMPARISON:  CT earlier same day, MRI 02/15/2020 FINDINGS: Motion artifact is present. Brain: There is restricted diffusion within the left pons corresponding to CT abnormality. Additional small focus of restricted diffusion is present along the posterior body of the right lateral ventricle. Prominence of the ventricles and sulci reflects generalized parenchymal volume loss. There is disproportionate ventricular prominence which is likely on an ex vacuo basis but could reflect communicating (normal pressure) hydrocephalus in the appropriate clinical setting. Patchy and confluent areas of T2 hyperintensity in the supratentorial white matter are nonspecific but probably reflect moderate chronic microvascular ischemic changes. There are chronic small vessel infarcts of the basal ganglia and thalamus. Focus of susceptibility in the lateral right thalamus is compatible with chronic microhemorrhage or less likely mineralization. Aahan Marques focus of susceptibility the parasagittal right cerebellum may reflect chronic microhemorrhage, mineralization, or occult cavernous malformation, noting corresponding hyperdensity on CT.  There is no intracranial mass or mass effect. Vascular: Major vessel flow voids at the skull base are preserved. Skull and upper cervical spine: Marrow signal is within normal limits. Sinuses/Orbits: Paranasal sinuses are aerated. Orbits are unremarkable. Other: Sella is unremarkable.  Mastoid air cells are clear. IMPRESSION: Acute left pontine infarction. Acute small infarct along the posterior body of the right lateral ventricle. Chronic/nonemergent findings detailed above similar to recent prior study. Electronically Signed   By: Macy Mis M.D.    On: 03/14/2020 16:40        Scheduled Meds: . amLODipine  10 mg Per Tube Daily  . aspirin EC  81 mg Oral Daily  . chlorhexidine  15 mL Mouth Rinse BID  . Chlorhexidine Gluconate Cloth  6 each Topical Daily  . clopidogrel  75 mg Per Tube Daily  . feeding supplement (OSMOLITE 1.2 CAL)  1,000 mL Per Tube Q24H  . free water  100 mL Per Tube Q6H  . heparin injection (subcutaneous)  5,000 Units Subcutaneous Q8H  . insulin aspart  0-9 Units Subcutaneous Q4H  . insulin detemir  4 Units Subcutaneous BID  . mouth rinse  15 mL Mouth Rinse BID  . metoprolol tartrate  25 mg Per Tube BID  . pantoprazole sodium  40 mg Per Tube Daily  . rosuvastatin  5 mg Oral Daily   Continuous Infusions: . sodium chloride 250 mL (03/14/20 1221)     LOS: 7 days    Time spent: over 30 min    Fayrene Helper, MD [facility]   To contact the attending provider between 7A-7P or the covering provider during after hours 7P-7A, please log into the web site [email or web address] and access using universal [place] password for that web site. If you do not have the password, please call the hospital operator.  03/15/2020, 10:16 AM

## 2020-03-15 NOTE — Consult Note (Addendum)
Referring Physician: Dr. Florene Glen    Chief Complaint: New stroke seen on repeat MRI   HPI: Tasha [name] is an 74 y.o. female with a PMHx of stroke, HTN, MI and DM who was recently discharged home 02/19/20 on rosuvastatin, ASA and Plavix after a stroke workup; she had presented at that time after a fall and MRI had shown a stroke involving the posterior limb of the left internal capsule and left parietal lobe.   She re-presented to the hospital on 7/12 with nausea and vomiting. She was diagnosed with AKI, lactic acidosis and possible sepsis. She also had AMS, which was diagnosed as secondary to acute metabolic encephalopathy in the setting of uremia and possible infection - this has been gradually improving during her stay, but has not improved back to baseline.   On Saturday, her right sided weakness was noted to have not improved with PT to the extent expected. An MRI was therefore obtained. The scan revealed an relatively large acute left pontine infarction as well as a punctate acute white matter infarct along the posterior body of the right lateral ventricle. These findings were new relative to the prior MRI from June 20, which had shown an 8 mm acute ischemic nonhemorrhagic infarct involving the posterior limb of the left internal capsule and a 5 mm focus of vague diffusion abnormality involving the subcortical left parietal lobe, in addition to evidence for recent prior ischemic infarctions.  Neurology was consulted for further recommendations regarding work-up for recurrent strokes.   LSN: Unknown based on chart review - has had persistent right sided weakness throughout her stay. tPA Given: No: Out of the time window  Past Medical History:  Diagnosis Date  . Diabetes mellitus without complication ([place])   . Essential (primary) hypertension   . Essential (primary) hypertension   . Essential (primary) hypertension   . History of falling   . Hypertension   . MI (myocardial infarction) ([place])    . Stroke [facility])     Past Surgical History:  Procedure Laterality Date  . ABDOMINAL HYSTERECTOMY    . APPENDECTOMY    . BACK SURGERY      Family History  Adopted: Yes  Family history unknown: Yes   Social History:  reports that she has never smoked. She has never used smokeless tobacco. She reports current alcohol use. She reports that she does not use drugs.  Allergies:  Allergies  Allergen Reactions  . Allopurinol Shortness Of Breath  . Tetracycline Anaphylaxis  . Amoxicillin-Pot Clavulanate Diarrhea  . Atorvastatin Other (See Comments)    Unknown reaction - reported by [facility] [place] [facility] 05/10/2012  . Azithromycin Other (See Comments)    Caused blisters inside and out  . Codeine Other (See Comments)    Altered mental status  . Lisinopril Other (See Comments)    Unknown reaction - reported by [facility] [place] clinic 12/23/2008 02/20/2020 she believes "reaction" was NP cough. No PMH of angioedema  . Sulfa Antibiotics Diarrhea    Medications:  Prior to Admission:  Medications Prior to Admission  Medication Sig Dispense Refill Last Dose  . amLODipine (NORVASC) 10 MG tablet Take 1 tablet (10 mg total) by mouth daily. 30 tablet 0 03/07/2020 at 10am  . bisacodyl (DULCOLAX) 10 MG suppository Place 10 mg rectally as needed for moderate constipation. If MOM doesn't work   unknown  . clopidogrel (PLAVIX) 75 MG tablet Take 1 tablet (75 mg total) by mouth daily. 30 tablet 0 03/07/2020 at 10am  . Dextromethorphan-quiNIDine (NUEDEXTA)  20-10 MG capsule Take 1 capsule by mouth daily. 30 capsule 0 03/07/2020 at 9am  . hydrochlorothiazide (HYDRODIURIL) 25 MG tablet Take 1 tablet (25 mg total) by mouth daily. 30 tablet 0 03/07/2020 at 10am  . loratadine (CLARITIN) 10 MG tablet Take 10 mg by mouth daily as needed for allergies.   unknown  . magnesium hydroxide (MILK OF MAGNESIA) 400 MG/5ML suspension Take 30 mLs by mouth daily as needed for mild constipation. If no BM in 3 days   03/07/2020 at 1pm   . metFORMIN (GLUCOPHAGE) 1000 MG tablet Take 1 tablet (1,000 mg total) by mouth daily with breakfast. 30 tablet 0 03/07/2020 at 930am  . metoprolol tartrate (LOPRESSOR) 25 MG tablet Take 25 mg by mouth 2 (two) times daily.   03/07/2020 at 9pm  . ondansetron (ZOFRAN) 4 MG tablet Take 4 mg by mouth every 8 (eight) hours as needed for nausea.   unknown  . PRESCRIPTION MEDICATION Take 1 Bottle by mouth in the morning and at bedtime. Magic cup to prevent malnutricion   03/07/2020 at 5pm  . rosuvastatin (CRESTOR) 20 MG tablet Take 1 tablet (20 mg total) by mouth daily. 30 tablet 0 03/07/2020 at 9pm  . senna-docusate (SENOKOT-S) 8.6-50 MG tablet Take 1 tablet by mouth at bedtime as needed for mild constipation. 30 tablet 0 unknown  . Sodium Phosphates (RA SALINE ENEMA RE) Place 1 application rectally daily as needed (extreme constipation). If MOM & bisocydyl supp. Doesn't work   unknown  . tetrahydrozoline 0.05 % ophthalmic solution Place 1 drop into both eyes as needed (allergies).   unknown  . aspirin 81 MG chewable tablet Chew 81 mg by mouth daily.   03/06/2020   Scheduled: . amLODipine  10 mg Per Tube Daily  . chlorhexidine  15 mL Mouth Rinse BID  . Chlorhexidine Gluconate Cloth  6 each Topical Daily  . clopidogrel  75 mg Per Tube Daily  . feeding supplement (OSMOLITE 1.2 CAL)  1,000 mL Per Tube Q24H  . free water  100 mL Per Tube Q6H  . heparin injection (subcutaneous)  5,000 Units Subcutaneous Q8H  . insulin aspart  0-9 Units Subcutaneous Q4H  . insulin detemir  4 Units Subcutaneous BID  . mouth rinse  15 mL Mouth Rinse BID  . metoprolol tartrate  25 mg Per Tube BID  . pantoprazole sodium  40 mg Per Tube Daily  . rosuvastatin  5 mg Oral Daily   Continuous: . sodium chloride 250 mL (03/14/20 1221)    ROS: Unable to obtain due to AMS.   Physical Examination: Blood pressure (!) 158/66, pulse 73, temperature 98.1 F (36.7 C), temperature source Oral, resp. rate 20, height 5' 4" (1.626 m),  weight 63.8 kg, SpO2 100 %.  HEENT: [place]/AT Lungs: Respirations unlabored Ext: No edema  Neurologic Examination: Mental Status: Initially asleep, she awakens to repeated calling of her voice. She is abulic and stares straight forward, but will track briefly if repeatedly asked. Almost completely nonverbal, but will give single word answers to questions after a significant delay, for example saying "Friday" slowly and with very hypophonic vocalization when asked for the day of the week. In similar fashion says "July" slowly when asked the month and "[place]" when asked for the city. Is mute when asked for the state and the year. Mouthed what appeared to be "five" when asked to count the examiner's fingers. Followed commands to track, weakly grimace, and weakly grip examiner's fingers with her left hand, but did   not wiggle toes to command. Overall, there is significantly increased latency in answering questions and following commands.  Cranial Nerves: II:  Blinks to threat inconsistently in left and right hemifields. PERRL.  III,IV, VI: Will slowly track to the left and right to command. Otherwise, eyes are at the midline without spontaneous saccades towards visual stimuli. V,VII: Weak grimace is with slightly less movement on the left. Responds minimally to cool temp stimulus on both cheeks.  VIII: hearing intact to some commands IX,X: Severely hypophonic speech.  XI: Head is at the midline.  XII: Midline tongue extension - slowly protrudes only as far as her front teeth.  Motor: Upper extremities: Increased extensor tone at the elbows bilaterally. Does not lift antigravity bilaterally. Weak handgrip on the left, does not grip on the right. Did not move either arm to pinch. Lower extremities: Does not move either lower extremity to command, except for wiggling toes of left foot. With plantar stimulation, there is minimal movement of her RLE and weak withdrawal of her LLE. Extensor tone is mildly  increased in both lower extremities.  Sensory: Grimaces weakly to pinch in both upper and both lower extremities.  Deep Tendon Reflexes:  1+ bilateral brachioradialis and biceps Trace bilateral patellae Plantars: Mute bilaterally  Cerebellar/Gait: Unable to assess   Results for orders placed or performed during the hospital encounter of 03/08/20 (from the past 48 hour(s))  Renal function panel     Status: Abnormal   Collection Time: 03/13/20  2:51 AM  Result Value Ref Range   Sodium 134 (L) 135 - 145 mmol/L   Potassium 3.7 3.5 - 5.1 mmol/L   Chloride 93 (L) 98 - 111 mmol/L   CO2 26 22 - 32 mmol/L   Glucose, Bld 311 (H) 70 - 99 mg/dL    Comment: Glucose reference range applies only to samples taken after fasting for at least 8 hours.   BUN 99 (H) 8 - 23 mg/dL   Creatinine, Ser 6.18 (H) 0.44 - 1.00 mg/dL   Calcium 7.9 (L) 8.9 - 10.3 mg/dL   Phosphorus 3.7 2.5 - 4.6 mg/dL   Albumin 2.9 (L) 3.5 - 5.0 g/dL   GFR calc non Af Amer 6 (L) >60 mL/min   GFR calc Af Amer 7 (L) >60 mL/min   Anion gap 15 5 - 15    Comment: Performed at [facility], [address] , [place] [address]  CBC with Differential/Platelet     Status: Abnormal   Collection Time: 03/13/20  2:51 AM  Result Value Ref Range   WBC 12.4 (H) 4.0 - 10.5 K/uL   RBC 4.12 3.87 - 5.11 MIL/uL   Hemoglobin 11.9 (L) 12.0 - 15.0 g/dL   HCT 35.7 (L) 36 - 46 %   MCV 86.7 80.0 - 100.0 fL   MCH 28.9 26.0 - 34.0 pg   MCHC 33.3 30.0 - 36.0 g/dL   RDW 13.4 11.5 - 15.5 %   Platelets 160 150 - 400 K/uL   nRBC 0.0 0.0 - 0.2 %   Neutrophils Relative % 77 %   Neutro Abs 9.5 (H) 1.7 - 7.7 K/uL   Lymphocytes Relative 11 %   Lymphs Abs 1.3 0.7 - 4.0 K/uL   Monocytes Relative 6 %   Monocytes Absolute 0.8 0 - 1 K/uL   Eosinophils Relative 4 %   Eosinophils Absolute 0.6 (H) 0 - 0 K/uL   Basophils Relative 0 %   Basophils Absolute 0.0 0 - 0   K/uL   Immature Granulocytes 2 %   Abs Immature Granulocytes 0.20 (H)  0.00 - 0.07 K/uL    Comment: Performed at [facility], [place] [address], [place], [place] [address]  Magnesium     Status: None   Collection Time: 03/13/20  2:51 AM  Result Value Ref Range   Magnesium 2.2 1.7 - 2.4 mg/dL    Comment: Performed at [facility], [place] [address] [place], [place] [address]  Phosphorus     Status: None   Collection Time: 03/13/20  2:51 AM  Result Value Ref Range   Phosphorus 3.7 2.5 - 4.6 mg/dL    Comment: Performed at [facility], [place] [address], [place], [place] [address]  Glucose, capillary     Status: Abnormal   Collection Time: 03/13/20  3:22 AM  Result Value Ref Range   Glucose-Capillary 282 (H) 70 - 99 mg/dL    Comment: Glucose reference range applies only to samples taken after fasting for at least 8 hours.   Comment 1 Notify RN    Comment 2 Document in Chart   Glucose, capillary     Status: Abnormal   Collection Time: 03/13/20  7:45 AM  Result Value Ref Range   Glucose-Capillary 257 (H) 70 - 99 mg/dL    Comment: Glucose reference range applies only to samples taken after fasting for at least 8 hours.  Glucose, capillary     Status: Abnormal   Collection Time: 03/13/20  4:16 PM  Result Value Ref Range   Glucose-Capillary 276 (H) 70 - 99 mg/dL    Comment: Glucose reference range applies only to samples taken after fasting for at least 8 hours.  Glucose, capillary     Status: Abnormal   Collection Time: 03/13/20  8:06 PM  Result Value Ref Range   Glucose-Capillary 214 (H) 70 - 99 mg/dL    Comment: Glucose reference range applies only to samples taken after fasting for at least 8 hours.   Comment 1 Notify RN    Comment 2 Document in Chart   MRSA PCR Screening     Status: None   Collection Time: 03/13/20 10:42 PM   Specimen: Nasopharyngeal  Result Value Ref Range   MRSA by PCR NEGATIVE NEGATIVE    Comment:        The GeneXpert MRSA Assay (FDA approved for NASAL specimens only), is one  component of a comprehensive MRSA colonization surveillance program. It is not intended to diagnose MRSA infection nor to guide or monitor treatment for MRSA infections. Performed at [facility], [place] [address] [place], [place] [address]   Glucose, capillary     Status: Abnormal   Collection Time: 03/13/20 11:40 PM  Result Value Ref Range   Glucose-Capillary 192 (H) 70 - 99 mg/dL    Comment: Glucose reference range applies only to samples taken after fasting for at least 8 hours.   Comment 1 Notify RN    Comment 2 Document in Chart   Renal function panel     Status: Abnormal   Collection Time: 03/14/20  3:35 AM  Result Value Ref Range   Sodium 139 135 - 145 mmol/L   Potassium 3.7 3.5 - 5.1 mmol/L   Chloride 99 98 - 111 mmol/L   CO2 26 22 - 32 mmol/L   Glucose, Bld 180 (H) 70 - 99 mg/dL    Comment: Glucose reference range applies only to samples taken after fasting for at least  8 hours.   BUN 84 (H) 8 - 23 mg/dL   Creatinine, Ser 5.49 (H) 0.44 - 1.00 mg/dL   Calcium 8.6 (L) 8.9 - 10.3 mg/dL   Phosphorus 2.8 2.5 - 4.6 mg/dL   Albumin 2.8 (L) 3.5 - 5.0 g/dL   GFR calc non Af Amer 7 (L) >60 mL/min   GFR calc Af Amer 8 (L) >60 mL/min   Anion gap 14 5 - 15    Comment: Performed at [facility], [place] [address] [place], [place] [address]  CBC with Differential/Platelet     Status: Abnormal   Collection Time: 03/14/20  3:35 AM  Result Value Ref Range   WBC 14.5 (H) 4.0 - 10.5 K/uL   RBC 4.08 3.87 - 5.11 MIL/uL   Hemoglobin 11.9 (L) 12.0 - 15.0 g/dL   HCT 35.2 (L) 36 - 46 %   MCV 86.3 80.0 - 100.0 fL   MCH 29.2 26.0 - 34.0 pg   MCHC 33.8 30.0 - 36.0 g/dL   RDW 13.7 11.5 - 15.5 %   Platelets 180 150 - 400 K/uL   nRBC 0.0 0.0 - 0.2 %   Neutrophils Relative % 74 %   Neutro Abs 10.7 (H) 1.7 - 7.7 K/uL   Lymphocytes Relative 13 %   Lymphs Abs 1.8 0.7 - 4.0 K/uL   Monocytes Relative 6 %   Monocytes Absolute 0.9 0 - 1 K/uL   Eosinophils  Relative 5 %   Eosinophils Absolute 0.7 (H) 0 - 0 K/uL   Basophils Relative 0 %   Basophils Absolute 0.0 0 - 0 K/uL   Immature Granulocytes 2 %   Abs Immature Granulocytes 0.29 (H) 0.00 - 0.07 K/uL    Comment: Performed at [facility], [place] [address], [place], [place] [address]  Magnesium     Status: None   Collection Time: 03/14/20  3:35 AM  Result Value Ref Range   Magnesium 1.9 1.7 - 2.4 mg/dL    Comment: Performed at [facility], [place] [address], [place], [place] [address]  Phosphorus     Status: None   Collection Time: 03/14/20  3:35 AM  Result Value Ref Range   Phosphorus 3.0 2.5 - 4.6 mg/dL    Comment: Performed at [facility] [place] [address] [place], [place] [address]  Hepatic function panel     Status: Abnormal   Collection Time: 03/14/20  3:35 AM  Result Value Ref Range   Total Protein 6.2 (L) 6.5 - 8.1 g/dL   Albumin 2.8 (L) 3.5 - 5.0 g/dL   AST 23 15 - 41 U/L   ALT 25 0 - 44 U/L   Alkaline Phosphatase 60 38 - 126 U/L   Total Bilirubin 0.3 0.3 - 1.2 mg/dL   Bilirubin, Direct <0.1 0.0 - 0.2 mg/dL   Indirect Bilirubin NOT CALCULATED 0.3 - 0.9 mg/dL    Comment: Performed at [facility], [place] [address] [place], [place] [address]  Glucose, capillary     Status: Abnormal   Collection Time: 03/14/20  4:19 AM  Result Value Ref Range   Glucose-Capillary 180 (H) 70 - 99 mg/dL    Comment: Glucose reference range applies only to samples taken after fasting for at least 8 hours.   Comment 1 Notify RN    Comment 2 Document in Chart   Glucose, capillary     Status: Abnormal   Collection Time: 03/14/20  5:47 PM  Result  Value Ref Range   Glucose-Capillary 153 (H) 70 - 99 mg/dL    Comment: Glucose reference range applies only to samples taken after fasting for at least 8 hours.   Comment 1 Notify RN    Comment 2 Document in Chart   Glucose, capillary     Status: Abnormal   Collection Time: 03/14/20   9:12 PM  Result Value Ref Range   Glucose-Capillary 147 (H) 70 - 99 mg/dL    Comment: Glucose reference range applies only to samples taken after fasting for at least 8 hours.  Glucose, capillary     Status: Abnormal   Collection Time: 03/14/20 11:49 PM  Result Value Ref Range   Glucose-Capillary 155 (H) 70 - 99 mg/dL    Comment: Glucose reference range applies only to samples taken after fasting for at least 8 hours.   CT HEAD WO CONTRAST  Result Date: 03/14/2020 CLINICAL DATA:  Focal neuro deficit greater than 6 hours. Altered mental status. Stroke. EXAM: CT HEAD WITHOUT CONTRAST TECHNIQUE: Contiguous axial images were obtained from the base of the skull through the vertex without intravenous contrast. COMPARISON:  CT head 03/08/2020.  MRI head 02/15/2020 FINDINGS: Brain: Moderate to advanced atrophy. Chronic microvascular ischemic change throughout the white matter. Ischemic changes in the internal capsule bilaterally. Acute infarct posterior limb internal capsule left best seen on prior MRI. Prominent hypodensity in the left paracentral pons. This was present on the prior CT. No restricted diffusion on prior MRI. This likely represents subacute infarction which has occurred since the prior MRI. Vascular: Negative for hyperdense vessel Skull: Negative Sinuses/Orbits: Negative Other: None IMPRESSION: Generalized atrophy and moderate to extensive chronic ischemic change Prominent hypodensity in the left pons unchanged from the recent CT. Probable subacute infarct. Follow-up MRI could confirm. Electronically Signed   By: Charles  Clark M.D.   On: 03/14/2020 12:49   MR BRAIN WO CONTRAST  Result Date: 03/14/2020 CLINICAL DATA:  Altered mental status, abnormal CT EXAM: MRI HEAD WITHOUT CONTRAST TECHNIQUE: Multiplanar, multiecho pulse sequences of the brain and surrounding structures were obtained without intravenous contrast. COMPARISON:  CT earlier same day, MRI 02/15/2020 FINDINGS: Motion artifact is  present. Brain: There is restricted diffusion within the left pons corresponding to CT abnormality. Additional small focus of restricted diffusion is present along the posterior body of the right lateral ventricle. Prominence of the ventricles and sulci reflects generalized parenchymal volume loss. There is disproportionate ventricular prominence which is likely on an ex vacuo basis but could reflect communicating (normal pressure) hydrocephalus in the appropriate clinical setting. Patchy and confluent areas of T2 hyperintensity in the supratentorial white matter are nonspecific but probably reflect moderate chronic microvascular ischemic changes. There are chronic small vessel infarcts of the basal ganglia and thalamus. Focus of susceptibility in the lateral right thalamus is compatible with chronic microhemorrhage or less likely mineralization. A focus of susceptibility the parasagittal right cerebellum may reflect chronic microhemorrhage, mineralization, or occult cavernous malformation, noting corresponding hyperdensity on CT. There is no intracranial mass or mass effect. Vascular: Major vessel flow voids at the skull base are preserved. Skull and upper cervical spine: Marrow signal is within normal limits. Sinuses/Orbits: Paranasal sinuses are aerated. Orbits are unremarkable. Other: Sella is unremarkable.  Mastoid air cells are clear. IMPRESSION: Acute left pontine infarction. Acute small infarct along the posterior body of the right lateral ventricle. Chronic/nonemergent findings detailed above similar to recent prior study. Electronically Signed   By: Praneil  Patel M.D.   On: 03/14/2020 16:40      Assessment: 73 y.o. female with recent strokes in June, readmitted with AKI and possible infection. Repeat MRI brain during this admission revealed a new, relatively large acute left pontine ischemic infarction and an acute punctate right periventricular white matter ischemic infarction. Neurology was consulted  for further recommendations regarding work-up for recurrent strokes.  1. 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 Localizing value of motor exam is confounded by decreased movement.  2. DDx for the patient's encephalopathy include toxic, metabolic and infectious etiologies, possibly on an underlying dementia (potential dementing processes include Lewy body dementia, AD and multiinfarct dementia). Subclinical seizures are also possible but felt to be unlikely.  3. Echocardiogram on 6/21 revealed LV with hyperdynamic function and EF of 70-75%. No mural thrombus is described in the report.  4. CTA of head on 6/21 revealed widespread intracranial atherosclerosis without major branch Occlusion. Findings included mild right and moderate left paraclinoid ICA stenoses, mild to moderate right M1 and severe right P2 stenoses. 5. CTA of neck on 6/21 revealed widely patent cervical carotid and vertebral arteries 6.  Recurrent strokes are most likely due widespread intracranial atherosclerosis. However, given her AKI, a systemic small vessel vasculitis is also on the DDx. Would consider discussing the benefits/risks of renal biopsy with Nephrology.  7. Stroke Risk Factors - Prior stroke, HTN, MI and DM  Recommendations: 1. EEG (ordered) 2. Cardiac telemetry 3. Transfer to [facility] when able 4. BP management. Out of permissive HTN time window 5. A systemic small vessel vasculitis is on the DDx given recurrent small vessel strokes and AKI, would consider discussing the benefits/risks of renal biopsy with Nephrology.  6. Continue Plavix. Restart ASA unless there is a contraindication.   7. Frequent neuro checks 8. Continue to limit sedating medications.  9. ESR and C-reactive protein. Lupus panel.   @Electronically signed: Dr.  @ 03/15/2020, 12:16 AM    

## 2020-03-15 NOTE — Plan of Care (Signed)
Spoke with primary team - patient is transferring to [facility] for new CVA.  Per neuro small vessel vasculitis in differential.  have ordered up/cr ratio, ANA, ANCA, complement.  In setting of new CVA would prefer to defer renal biopsy for now and await serologies.  Estanislado Emms, MD 03/15/2020  6:18 PM

## 2020-03-16 LAB — C4 COMPLEMENT: Complement C4, Body Fluid: 37 mg/dL (ref 12–38)

## 2020-03-16 LAB — RENAL FUNCTION PANEL
Albumin: 2.4 g/dL — ABNORMAL LOW (ref 3.5–5.0)
Anion gap: 10 (ref 5–15)
BUN: 63 mg/dL — ABNORMAL HIGH (ref 8–23)
CO2: 25 mmol/L (ref 22–32)
Calcium: 8.7 mg/dL — ABNORMAL LOW (ref 8.9–10.3)
Chloride: 103 mmol/L (ref 98–111)
Creatinine, Ser: 4.04 mg/dL — ABNORMAL HIGH (ref 0.44–1.00)
GFR calc Af Amer: 12 mL/min — ABNORMAL LOW (ref >60)
GFR calc non Af Amer: 10 mL/min — ABNORMAL LOW (ref >60)
Glucose, Bld: 198 mg/dL — ABNORMAL HIGH (ref 70–99)
Phosphorus: 4.4 mg/dL (ref 2.5–4.6)
Potassium: 3.6 mmol/L (ref 3.5–5.1)
Sodium: 138 mmol/L (ref 135–145)

## 2020-03-16 LAB — GLUCOSE, CAPILLARY
Glucose-Capillary: 136 mg/dL — ABNORMAL HIGH (ref 70–99)
Glucose-Capillary: 168 mg/dL — ABNORMAL HIGH (ref 70–99)
Glucose-Capillary: 176 mg/dL — ABNORMAL HIGH (ref 70–99)
Glucose-Capillary: 182 mg/dL — ABNORMAL HIGH (ref 70–99)
Glucose-Capillary: 200 mg/dL — ABNORMAL HIGH (ref 70–99)
Glucose-Capillary: 231 mg/dL — ABNORMAL HIGH (ref 70–99)

## 2020-03-16 LAB — ANTI-DNA ANTIBODY, DOUBLE-STRANDED: ds DNA Ab: <1 IU/mL (ref 0–9)

## 2020-03-16 LAB — C3 COMPLEMENT: C3 Complement: 121 mg/dL (ref 82–167)

## 2020-03-16 LAB — ANA: Anti Nuclear Antibody (ANA): NEGATIVE

## 2020-03-16 LAB — MPO/PR-3 (ANCA) ANTIBODIES
ANCA Proteinase 3: <3.5 U/mL (ref 0.0–3.5)
Myeloperoxidase Abs: <9 U/mL (ref 0.0–9.0)

## 2020-03-16 MED ORDER — ROSUVASTATIN CALCIUM 5 MG PO TABS
5.0000 mg | ORAL_TABLET | Freq: Every day | ORAL | Status: DC
  Administered 2020-03-17 – 2020-04-27 (×41): 5 mg
  Filled 2020-03-16 (×41): qty 1

## 2020-03-16 MED ORDER — ENSURE MAX PROTEIN PO LIQD
11.0000 [oz_av] | Freq: Two times a day (BID) | ORAL | Status: DC
  Administered 2020-03-17 – 2020-03-24 (×12): 11 [oz_av] via ORAL
  Filled 2020-03-16 (×20): qty 330

## 2020-03-16 MED ORDER — OSMOLITE 1.2 CAL PO LIQD
1000.0000 mL | ORAL | Status: DC
  Administered 2020-03-16 – 2020-03-17 (×2): 1000 mL
  Filled 2020-03-16 (×4): qty 1000

## 2020-03-16 MED ORDER — ASPIRIN 81 MG PO CHEW
81.0000 mg | CHEWABLE_TABLET | Freq: Every day | ORAL | Status: AC
  Administered 2020-03-17 – 2020-04-04 (×18): 81 mg
  Filled 2020-03-16 (×18): qty 1

## 2020-03-16 MED ORDER — PROSOURCE TF PO LIQD
45.0000 mL | Freq: Every day | ORAL | Status: DC
  Administered 2020-03-16 – 2020-03-25 (×10): 45 mL
  Filled 2020-03-16 (×10): qty 45

## 2020-03-16 NOTE — Progress Notes (Signed)
Patient was discharged and is in transit to [facility].

## 2020-03-16 NOTE — Progress Notes (Signed)
Patient ID: Tasha [name], female   DOB: Oct 10, 1945, 74 y.o.   MRN: [id number] [facility] Progress Note   Assessment/ Plan:   1. Acute kidney Injury: Nonoliguric,acute injury thought to have been from sustained prerenal state with evolution to ATN (clinically suspected with component of sepsis). Renal function continues to show improvement with downtrending BUN/creatinine and decent urine output without acute HD indication. - Re-consulted per team as per neuro small vessel vasculitis in differential thus considering if possible etiology of renal disease as well.  Note complement normal and ANCA and ANA negative.  UA   - AKI has been improving with supportive care and at this time serologies do not suggest vasculitis  - In setting of new CVA would prefer to defer renal biopsy and continue supportive care - renal panel in AM  2.  Possible sepsis: With leukocytosis/hypothermia and acute kidney injury.  Initially on broad-spectrum antimicrobial coverage with vancomycin and cefepime that has been narrowed down per primary team  3.  Proteinuria - subnephrotic range.  GN w/u as above thus far is negative.  Send SPEP and free light chains  4.  Altered mental status: Likely secondary to sepsis versus azotemia also now s/p CVA  5.  CVA - new CVA this admission for which she was transferred to [facility] - per neuro   6.  Hypertension: given new CVA defer blood pressure management to primary team and neuro   Subjective:     Transferred to [facility] for new CVA.  Had 1.6 liters UOP over 7/19.  She does not provide much additional hx   Review of systems: Denies n/v Denies shortness of breath or chest pain   Objective:   BP (!) 156/75 (BP Location: Left Arm) Comment: no medication given  Pulse 62   Temp 98.4 F (36.9 C) (Oral)   Resp 16   Ht 5\' 4"  (1.626 m)   Wt 60.5 kg   SpO2 98%   BMI 22.89 kg/m   Intake/Output Summary (Last 24 hours) at 03/16/2020 1516 Last data filed at 03/16/2020  0355 Gross per 24 hour  Intake 999 ml  Output 500 ml  Net 499 ml   Weight change: -1.9 kg  Physical Exam: Gen: Appears comfortable resting in bed. CVS: S1 and S2 no rub  Resp: Clear to auscultation, no distinct rales or rhonchi Abd: Soft, nontender, ND Ext: No lower extremity edema pysch no anxiety or agitation.  Speech is slow but she states her name and shakes head or nods as well  Imaging: EEG  Result Date: 03/15/2020 03/17/2020, MD     03/15/2020  4:47 PM Patient Name: Tasha [name] MRN: [facility] Attending: [id number] Referring Physician/Provider: Dr Charlsie Quest Date: 03/15/2020 Duration: 25.50 mins Patient history: 73yo F with ams and acute left pontine stroke. EEG to evaluate for seizure. Level of alertness: Awake,/lethargic AEDs during EEG study: None Technical aspects: This EEG study was done with scalp electrodes positioned according to the 10-20 International system of electrode placement. Electrical activity was acquired at a sampling rate of 500Hz  and reviewed with a high frequency filter of 70Hz  and a low frequency filter of 1Hz . EEG data were recorded continuously and digitally stored. Description: No posterior dominant rhythm. EEG showed continuous generalized 3 to 6 Hz theta-delta slowing as well as triphasic waves, generalized, maximal bifrontal. Hyperventilation and photic stimulation were not performed.   ABNORMALITY -Continuous slow, generalized -Triphasic waves, generalized IMPRESSION: This study is suggestive of moderate diffuse encephalopathy,  nonspecific etiology but likely related to toxic-metabolic etiology. No seizures or epileptiform discharges were seen throughout the recording. Charlsie Quest   MR BRAIN WO CONTRAST  Result Date: 03/14/2020 CLINICAL DATA:  Altered mental status, abnormal CT EXAM: MRI HEAD WITHOUT CONTRAST TECHNIQUE: Multiplanar, multiecho pulse sequences of the brain and surrounding structures were obtained without  intravenous contrast. COMPARISON:  CT earlier same day, MRI 02/15/2020 FINDINGS: Motion artifact is present. Brain: There is restricted diffusion within the left pons corresponding to CT abnormality. Additional small focus of restricted diffusion is present along the posterior body of the right lateral ventricle. Prominence of the ventricles and sulci reflects generalized parenchymal volume loss. There is disproportionate ventricular prominence which is likely on an ex vacuo basis but could reflect communicating (normal pressure) hydrocephalus in the appropriate clinical setting. Patchy and confluent areas of T2 hyperintensity in the supratentorial white matter are nonspecific but probably reflect moderate chronic microvascular ischemic changes. There are chronic small vessel infarcts of the basal ganglia and thalamus. Focus of susceptibility in the lateral right thalamus is compatible with chronic microhemorrhage or less likely mineralization. A focus of susceptibility the parasagittal right cerebellum may reflect chronic microhemorrhage, mineralization, or occult cavernous malformation, noting corresponding hyperdensity on CT. There is no intracranial mass or mass effect. Vascular: Major vessel flow voids at the skull base are preserved. Skull and upper cervical spine: Marrow signal is within normal limits. Sinuses/Orbits: Paranasal sinuses are aerated. Orbits are unremarkable. Other: Sella is unremarkable.  Mastoid air cells are clear. IMPRESSION: Acute left pontine infarction. Acute small infarct along the posterior body of the right lateral ventricle. Chronic/nonemergent findings detailed above similar to recent prior study. Electronically Signed   By: Guadlupe Spanish M.D.   On: 03/14/2020 16:40    Labs: BMET Recent Labs  Lab 03/10/20 0229 03/10/20 1547 03/10/20 2319 03/11/20 0252 03/12/20 0250 03/13/20 0251 03/14/20 0335 03/15/20 0450 03/16/20 1155  NA 140   < > 139 137 137 134* 139 137 138  K  2.8*   < > 3.4* 3.4* 4.2 3.7 3.7 3.5 3.6  CL 81*   < > 84* 86* 89* 93* 99 99 103  CO2 31   < > 32 32 27 26 26 27 25   GLUCOSE 150*   < > 156* 152* 163* 311* 180* 185* 198*  BUN 143*   < > 124* 112* 103* 99* 84* 75* 63*  CREATININE 8.17*  8.05*   < > 7.75* 7.72* 7.09* 6.18* 5.49* 4.67*  4.53* 4.04*  CALCIUM 6.5*   < > 6.4* 6.7* 7.5* 7.9* 8.6* 8.7* 8.7*  PHOS 6.5*  --   --  4.2 3.9 3.7  3.7 3.0  2.8 3.9 4.4   < > = values in this interval not displayed.   CBC Recent Labs  Lab 03/12/20 0250 03/13/20 0251 03/14/20 0335 03/15/20 0450  WBC 12.6* 12.4* 14.5* 12.3*  NEUTROABS 9.8* 9.5* 10.7* 9.3*  HGB 12.0 11.9* 11.9* 10.4*  HCT 36.0 35.7* 35.2* 31.5*  MCV 86.5 86.7 86.3 87.5  PLT 158 160 180 147*    Medications:    . amLODipine  10 mg Per Tube Daily  . aspirin EC  81 mg Oral Daily  . chlorhexidine  15 mL Mouth Rinse BID  . clopidogrel  75 mg Per Tube Daily  . feeding supplement (OSMOLITE 1.2 CAL)  1,000 mL Per Tube Q24H  . free water  100 mL Per Tube Q6H  . heparin injection (subcutaneous)  5,000 Units Subcutaneous  Q8H  . insulin aspart  0-9 Units Subcutaneous Q4H  . insulin detemir  4 Units Subcutaneous BID  . mouth rinse  15 mL Mouth Rinse BID  . metoprolol tartrate  25 mg Per Tube BID  . pantoprazole sodium  40 mg Per Tube Daily  . rosuvastatin  5 mg Oral Daily   Estanislado Emms, MD 03/16/2020, 3:38 PM

## 2020-03-16 NOTE — Progress Notes (Signed)
Nutrition Follow-up  DOCUMENTATION CODES:   Not applicable  INTERVENTION:   Ensure Max po BID, each supplement provides 150 kcal and 30 grams of protein   Via NGT:  -Osmolite 1.2 cal @ 53ml/hr ( ) -39ml Prosource TF daily - free water Q6H (per MD)  Tube feeding will provide 1336 kcal (meets 89% of minimum estimated needs), 70 grams of protein, free water ( total free water with flushes)   NUTRITION DIAGNOSIS:   Inadequate oral intake related to lethargy/confusion as evidenced by other (comment) (per RN report).    GOAL:   Patient will meet greater than or equal to 90% of their needs  Will address with TF and supplements.   MONITOR:   PO intake, Diet advancement, TF tolerance, Labs, Weight trends  REASON FOR ASSESSMENT:   Consult Enteral/tube feeding initiation and management  ASSESSMENT:   Pt with a history of stroke in June, HTN, MI and DM presented 7/12 with N/V and AMS. Pt admitted w/ uremia and possible infection. In hospital, R sided weakness not improving, MRI showed new acute infarcts.  7/15 NGT placed (gastric), TF initiated 7/16 NGT advanced to LOT 7/17 diet advanced to full liquids, TF reduced by MD  Per Neurology, new infarcts are d/t small vessel disease.   Discussed pt with RN.  Pt's tube feeding was previously Osmolite 1.2 cal @ 72ml/hr. When pt was advanced to full liquids, MD reduced TF rate to 14ml/hr. There is no PO intake documented, but RN reports pt has been lethargic and has not been eating a lot of her meals. Of note, full liquid diet unlikely to provide enough calories/protein regardless of pt's intake. Will order oral nutrition supplements in hopes of improving pt's po intake, though not confident pt will drink these given lethargy. RD will also increase TF rate to better meet pt's dietary needs until po intake is improved. Would not recommend reducing tube feeding rate until pt's diet is further advanced.   Current  TF via NGT: Osmolite 1.2 cal @ 92ml/hr, free water Q6H  Labs: CBGs [phone] Medications: Novolog, Levemir, Protonix  UOP: 1,652ml x24 hours I/O: +5,964.48ml since admit  Diet Order:   Diet Order            Diet full liquid Room service appropriate? Yes; Fluid consistency: Thin  Diet effective now                 EDUCATION NEEDS:   No education needs have been identified at this time  Skin:  Skin Assessment: Skin Integrity Issues: Skin Integrity Issues:: Other (Comment) Other: MASD perineum, buttocks  Last BM:  7/18  Height:   Ht Readings from Last 1 Encounters:  03/08/20 5\' 4"  (1.626 m)    Weight:   Wt Readings from Last 1 Encounters:  03/16/20 60.5 kg     BMI:  Body mass index is 22.89 kg/m.  Estimated Nutritional Needs:   Kcal:  1500-1700 kcal  Protein:  75-85 grams  Fluid:  >/= 1.8 L/day    03/18/20, MS, RD, LDN RD pager number and weekend/on-call pager number located in Amion.

## 2020-03-16 NOTE — Progress Notes (Signed)
STROKE TEAM PROGRESS NOTE   INTERVAL HISTORY I have personally reviewed history of presenting illness, electronic medical records and imaging films in PACS.  Patient is doing better today and is more arousable and interactive.  She continues to have dysarthria but is able to follow simple commands.  Her metabolic parameters appear to be improving.  EEG shows mild generalized slowing and triphasic waves but no definite seizure activity.  Vitals:   03/16/20 0330 03/16/20 0747 03/16/20 1200 03/16/20 1246  BP: (!) 167/77 (!) 180/76 (!) 182/80 (!) 156/75  Pulse: 74 79 62   Resp: 19 17 16    Temp: 98.2 F (36.8 C) 98.5 F (36.9 C) 98.4 F (36.9 C)   TempSrc: Oral Oral Oral   SpO2: 100% 96% 98%   Weight:      Height:       CBC:  Recent Labs  Lab 03/14/20 0335 03/15/20 0450  WBC 14.5* 12.3*  NEUTROABS 10.7* 9.3*  HGB 11.9* 10.4*  HCT 35.2* 31.5*  MCV 86.3 87.5  PLT 180 147*   Basic Metabolic Panel:  Recent Labs  Lab 03/13/20 0251 03/13/20 0251 03/14/20 0335 03/14/20 0335 03/15/20 0450 03/16/20 1155  NA 134*   < > 139   < > 137 138  K 3.7   < > 3.7   < > 3.5 3.6  CL 93*   < > 99   < > 99 103  CO2 26   < > 26   < > 27 25  GLUCOSE 311*   < > 180*   < > 185* 198*  BUN 99*   < > 84*   < > 75* 63*  CREATININE 6.18*   < > 5.49*   < > 4.67*  4.53* 4.04*  CALCIUM 7.9*   < > 8.6*   < > 8.7* 8.7*  MG 2.2  --  1.9  --   --   --   PHOS 3.7  3.7   < > 3.0  2.8   < > 3.9 4.4   < > = values in this interval not displayed.   IMAGING past 24 hours EEG  Result Date: 03/15/2020 03/17/2020, MD     03/15/2020  4:47 PM Patient Name: Tasha [name] MRN: [facility] Attending: [id number] Referring Physician/Provider: Dr Charlsie Quest Date: 03/15/2020 Duration: 25.50 mins Patient history: 73yo F with ams and acute left pontine stroke. EEG to evaluate for seizure. Level of alertness: Awake,/lethargic AEDs during EEG study: None Technical aspects: This EEG study was done with  scalp electrodes positioned according to the 10-20 International system of electrode placement. Electrical activity was acquired at a sampling rate of 500Hz  and reviewed with a high frequency filter of 70Hz  and a low frequency filter of 1Hz . EEG data were recorded continuously and digitally stored. Description: No posterior dominant rhythm. EEG showed continuous generalized 3 to 6 Hz theta-delta slowing as well as triphasic waves, generalized, maximal bifrontal. Hyperventilation and photic stimulation were not performed.   ABNORMALITY -Continuous slow, generalized -Triphasic waves, generalized IMPRESSION: This study is suggestive of moderate diffuse encephalopathy, nonspecific etiology but likely related to toxic-metabolic etiology. No seizures or epileptiform discharges were seen throughout the recording. Tasha [name] 03/17/2020    PHYSICAL EXAM Middle-aged African-American lady not in distress. . Afebrile. Head is nontraumatic. Neck is supple without bruit.    Cardiac exam no murmur or gallop. Lungs are clear to auscultation. Distal pulses are well felt. Neurological Exam : Patient is drowsy  but can be aroused easily.  She has dysarthria but can be understood.  She follows simple midline and one-step commands.  She is oriented to time place and person.  She has diminished attention, registration and recall.  Extraocular movements appear full range without nystagmus she blinks to threat bilaterally.  Right lower facial weakness.  Tongue midline.  Motor system exam she is not very cooperative but is able to move all 4 extremities against gravity right upper extremity appears to show drift and weakness.  He has flaps in both upper extremities and asterixis.  She is able to withdraw both lower extremities equally against gravity.  ASSESSMENT/PLAN Tasha [name] is a 74 y.o. female with history of stroke in June, HTN, MI and DM presenting 7/12 with nausea and vomiting, altered mental status, admitted w/ uremia and  possible infection. In hospital, R sided weakness not improving, MRI showed new acute infarcts.   Stroke:   L pontine and R posterior lateral ventricle infarcts in setting of previous L parietal cortical & subcortical infarcts all d/t small vessel disease  CT head 7/12 L PLIC infarct, No new acute abnormality. Small vessel disease. Atrophy.   CT head 7/18 L pontine hypodensity unchanged from recent CT. Small vessel disease. Atrophy.   MRI  L pontine infarct. R posterior lateral ventricle infarct.  EEG generalized slow, triphasic waves  LDL 143 on 6/21  HgbA1c 6.9 on 6/21  VTE prophylaxis - Heparin 5000 units sq tid   clopidogrel 75 mg daily prior to admission, now on aspirin 81 mg daily and clopidogrel 75 mg daily. Continue DAPT x 3 weeks again then plavix alone  Therapy recommendations:  SNF - added OT  Disposition:  pending - from SNF  No need to repeat stroke workup  Hypertension  Elevated 150-180s . Ok for BP goal normotensive  Hyperlipidemia  Home meds:  crestor 20  Now on crestor 5  LDL 143 on 6/21, goal < 70  Continue statin at discharge  Diabetes type II Controlled  HgbA1c 6.9 on 6/21, goal < 7.0  Other Stroke Risk Factors  Advanced age  ETOH use, advised to drink no more than 1 drink(s) a day  Hx stroke/TIA  01/2020 - L parietal cortical and subcortical infarcts,  likely d/t small vessel disease. DAPT x 3 weeks.   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  She has shown some improvement in mental status following improvement in metabolic parameters.  Recommend continuing ongoing medical management as  per primary team.  Continue aspirin Plavix for stroke prevention for 3 weeks followed by Plavix alone.  Aggressive risk factor modification.  No need to do further stroke work-up as she had fairly extensive recent work-up in June.  Discussed with Dr. Sharon Seller.  Greater than 50% time during this 35-minute visit was spent in counseling and coordination of care about her recurrent lacunar strokes and altered mental status and answering questions. Delia Heady, MD To contact Stroke Continuity provider, please refer to [email or web address]. After hours, contact General Neurology

## 2020-03-16 NOTE — Progress Notes (Signed)
Tasha [name]  UUV:[id number] DOB: 10-09-1945 DOA: 03/08/2020 PCP: Patient, No Pcp Per    Brief Narrative:  74 year old with a history of stroke, DM2, HTN, and MI who was discharged February 19, 2020 following a stroke work-up and return to the hospital 03/08/2020 with 2 days of intractable nausea and vomiting along with altered mental status.  She was found to be suffering with lactic acidosis and acute kidney injury.  Following admission the patient improved with supportive care.  Due to persisting right-sided weakness which was felt to be worse than previously documented an MRI was obtained and suggested an acute left pontine infarct and acute small infarct on the posterior body of the right lateral ventricle.  As result she was transferred from [facility] to [facility] for neurologic evaluation.  Antimicrobials:  Cefepime 7/12 > 7/13 Rocephin 7/14 > 7/18 Vancomycin 7/12  Subjective: The patient remains sedate.  She does not wake up at the time of my exam.  She does not appear uncomfortable.  There is no evidence of respiratory distress.  I had a prolonged discussion with the patient's daughter at the bedside.  Assessment & Plan:  Acute CVA with history of recent CVA June 2021 Plan at discharge 02/19/2020 was aspirin plus Plavix x3 weeks then Plavix alone -work-up at present per Stroke Team  Acute kidney injury Most consistent with prerenal state, w/ probable component of ATN - Nephrology evaluated patient while at [facility] - hopeful for continued slow recovery of renal fxn - avoid nephrotoxins   Acute toxic metabolic encephalopathy Due to primarily to uremia and possible complicated by lacunar acute CVA - mental status has been waxing and waning since presentation - EEG unrevealing -TSH normal -B12 and folic acid not deficient  SIRS v/s Sepsis - POA Hypothermia, leukocytosis, and lactic acidosis present at admission -no clear source of infection identified -CXR at presentation with  atelectasis only -CT abdomen pelvis without acute findings -UA not consistent with UTI and culture without growth -blood cultures no growth x2 -was treated with broad-spectrum empiric antibiotic for 7-day course - suspect this was primarily SIRS related to profound dehydration   Intractable nausea vomiting and coffee-ground emesis Hemoccult negative -probable Mallory-Weiss tear due to recurrent vomiting caused by uremia - all anticoagulants/antiplatelet meds held initially but resumed Plavix 7/17 and aspirin 7/19  Dysphagia Diet per SLP recommendations  DM 2 A1c 6.9  HTN Avoid overaggressive correction in setting of acute kidney injury and CVA  DVT prophylaxis: SCDs Code Status: FULL CODE Family Communication: As noted above Status is: Inpatient  Remains inpatient appropriate because:IV treatments appropriate due to intensity of illness or inability to take PO   Dispo: The patient is from: SNF              Anticipated d/c is to: SNF              Anticipated d/c date is: > 3 days              Patient currently is not medically stable to d/c.   Consultants:  Stroke Team Nephrology  Objective: Blood pressure (!) 151/71, pulse 71, temperature 97.7 F (36.5 C), temperature source Oral, resp. rate 12, height 5\' 4"  (1.626 m), weight 60.5 kg, SpO2 97 %.  Intake/Output Summary (Last 24 hours) at 03/16/2020 1813 Last data filed at 03/16/2020 1746 Gross per 24 hour  Intake 999 ml  Output 1000 ml  Net -1 ml   Filed Weights   03/14/20 0500 03/15/20  8875 03/16/20 0158  Weight: 63.8 kg 62.4 kg 60.5 kg    Examination: General: No acute respiratory distress Lungs: Clear to auscultation bilaterally -mild bibasilar crackles consistent with atelectasis Cardiovascular: Regular rate and rhythm without murmur  Abdomen: Soft, nondistended, no rebound or mass, BS positive Extremities: 1+ edema right upper extremity with no cyanosis or clubbing or edema lower extremities  CBC: Recent Labs   Lab 03/13/20 0251 03/14/20 0335 03/15/20 0450  WBC 12.4* 14.5* 12.3*  NEUTROABS 9.5* 10.7* 9.3*  HGB 11.9* 11.9* 10.4*  HCT 35.7* 35.2* 31.5*  MCV 86.7 86.3 87.5  PLT 160 180 147*   Basic Metabolic Panel: Recent Labs  Lab 03/12/20 0250 03/12/20 0250 03/13/20 0251 03/13/20 0251 03/14/20 0335 03/15/20 0450 03/16/20 1155  NA 137   < > 134*   < > 139 137 138  K 4.2   < > 3.7   < > 3.7 3.5 3.6  CL 89*   < > 93*   < > 99 99 103  CO2 27   < > 26   < > 26 27 25   GLUCOSE 163*   < > 311*   < > 180* 185* 198*  BUN 103*   < > 99*   < > 84* 75* 63*  CREATININE 7.09*   < > 6.18*   < > 5.49* 4.67*  4.53* 4.04*  CALCIUM 7.5*   < > 7.9*   < > 8.6* 8.7* 8.7*  MG 2.6*  --  2.2  --  1.9  --   --   PHOS 3.9   < > 3.7  3.7   < > 3.0  2.8 3.9 4.4   < > = values in this interval not displayed.   GFR: Estimated Creatinine Clearance: 10.7 mL/min (A) (by C-G formula based on SCr of 4.04 mg/dL (H)).  Liver Function Tests: Recent Labs  Lab 03/11/20 0252 03/11/20 0252 03/12/20 0250 03/12/20 0250 03/13/20 0251 03/14/20 0335 03/15/20 0450 03/16/20 1155  AST 33  --  38  --   --  23 39  --   ALT 19  --  21  --   --  25 42  --   ALKPHOS 50  --  61  --   --  60 51  --   BILITOT 0.9  --  0.9  --   --  0.3 0.2*  --   PROT 5.4*  --  6.2*  --   --  6.2* 5.8*  --   ALBUMIN 2.8*   < > 3.0*   < > 2.9* 2.8*  2.8* 2.7*  2.7* 2.4*   < > = values in this interval not displayed.   Recent Labs  Lab 03/10/20 0229  LIPASE 99*    HbA1C: Hgb A1c MFr Bld  Date/Time Value Ref Range Status  02/15/2020 10:11 PM 6.9 (H) 4.8 - 5.6 % Final    Comment:    (NOTE) Pre diabetes:          5.7%-6.4%  Diabetes:              >6.4%  Glycemic control for   <7.0% adults with diabetes     CBG: Recent Labs  Lab 03/15/20 2347 03/16/20 0407 03/16/20 0749 03/16/20 1237 03/16/20 1550  GLUCAP 224* 136* 182* 168* 176*    Recent Results (from the past 240 hour(s))  SARS Coronavirus 2 by RT PCR  (hospital order, performed in [facility] hospital lab) Nasopharyngeal Nasopharyngeal Swab  Status: None   Collection Time: 03/08/20  9:00 AM   Specimen: Nasopharyngeal Swab  Result Value Ref Range Status   SARS Coronavirus 2 NEGATIVE NEGATIVE Final    Comment: (NOTE) SARS-CoV-2 target nucleic acids are NOT DETECTED.  The SARS-CoV-2 RNA is generally detectable in upper and lower respiratory specimens during the acute phase of infection. The lowest concentration of SARS-CoV-2 viral copies this assay can detect is 250 copies / mL. A negative result does not preclude SARS-CoV-2 infection and should not be used as the sole basis for treatment or other patient management decisions.  A negative result may occur with improper specimen collection / handling, submission of specimen other than nasopharyngeal swab, presence of viral mutation(s) within the areas targeted by this assay, and inadequate number of viral copies (<250 copies / mL). A negative result must be combined with clinical observations, patient history, and epidemiological information.  Fact Sheet for Patients:   BoilerBrush.com.cyhttps://www.fda.gov/media/136312/download  Fact Sheet for Healthcare Providers: https://pope.com/https://www.fda.gov/media/136313/download  This test is not yet approved or  cleared by the [place] FDA and has been authorized for detection and/or diagnosis of SARS-CoV-2 by FDA under an Emergency Use Authorization (EUA).  This EUA will remain in effect (meaning this test can be used) for the duration of the COVID-19 declaration under Section 564(b)(1) of the Act, 21 U.S.C. section 360bbb-3(b)(1), unless the authorization is terminated or revoked sooner.  Performed at [facility], [address] [place], [place] [address]   Culture, Urine     Status: None   Collection Time: 03/08/20 10:20 AM   Specimen: Urine, Random  Result Value Ref Range Status   Specimen Description   Final    URINE,  RANDOM Performed at [facility], [address] [place], [place] [address]    Special Requests   Final    NONE Performed at [facility], [address] [place], [place] [address]    Culture   Final    NO GROWTH Performed at [facility], [address] [place], [place] [address]    Report Status 03/10/2020 FINAL  Final  Blood culture (routine x 2)     Status: None   Collection Time: 03/08/20 11:45 AM   Specimen: BLOOD  Result Value Ref Range Status   Specimen Description   Final    BLOOD RIGHT ANTECUBITAL Performed at [facility], [address] [place], [place] [address]    Special Requests   Final    BOTTLES DRAWN AEROBIC AND ANAEROBIC Blood Culture adequate volume Performed at [facility], [address] [place], [place] [address]    Culture   Final    NO GROWTH 5 DAYS Performed at [facility], [address] [place], [place] [address]    Report Status 03/13/2020 FINAL  Final  Blood culture (routine x 2)     Status: None   Collection Time: 03/08/20 12:05 PM   Specimen: BLOOD  Result Value Ref Range Status   Specimen Description   Final    BLOOD LEFT WRIST Performed at [facility], [address] [place], [place] [address]    Special Requests   Final    BOTTLES DRAWN AEROBIC AND ANAEROBIC Blood Culture adequate volume Performed at [facility], [address] [place], [place] [address]    Culture   Final    NO GROWTH 5 DAYS Performed at [facility], [address] [place], [place] [address]    Report Status 03/13/2020 FINAL  Final  MRSA PCR Screening     Status: None   Collection Time: 03/08/20  1:47 PM   Specimen: Nasopharyngeal  Result Value Ref Range Status   MRSA by PCR NEGATIVE NEGATIVE Final    Comment:        The GeneXpert MRSA Assay (FDA approved for NASAL specimens only), is one component of  a comprehensive MRSA colonization surveillance program. It is not intended to diagnose MRSA infection nor to guide or monitor treatment for MRSA infections. Performed at [facility], [address] [place], [place] [address]   MRSA PCR Screening     Status: None   Collection Time: 03/13/20 10:42 PM   Specimen: Nasopharyngeal  Result Value Ref Range Status   MRSA by PCR NEGATIVE NEGATIVE Final    Comment:        The GeneXpert MRSA Assay (FDA approved for NASAL specimens only), is one component of a comprehensive MRSA colonization surveillance program. It is not intended to diagnose MRSA infection nor to guide or monitor treatment for MRSA infections. Performed at [facility], [address] [place], [place] [address]      Scheduled Meds: . amLODipine  10 mg Per Tube Daily  . aspirin EC  81 mg Oral Daily  . chlorhexidine  15 mL Mouth Rinse BID  . clopidogrel  75 mg Per Tube Daily  . feeding supplement (PROSource TF)  45 mL Per Tube Daily  . free water  100 mL Per Tube Q6H  . heparin injection (subcutaneous)  5,000 Units Subcutaneous Q8H  . insulin aspart  0-9 Units Subcutaneous Q4H  . insulin detemir  4 Units Subcutaneous BID  . mouth rinse  15 mL Mouth Rinse BID  . metoprolol tartrate  25 mg Per Tube BID  . pantoprazole sodium  40 mg Per Tube Daily  . Ensure Max Protein  11 oz Oral BID  . rosuvastatin  5 mg Oral Daily   Continuous Infusions: . sodium chloride 250 mL (03/14/20 1221)  . feeding supplement (OSMOLITE 1.2 CAL) 1,000 mL (03/16/20 1741)     LOS: 8 days   Lonia Blood, MD [facility] Office  [phone] Pager - Text Page per Amion  If 7PM-7AM, please contact night-coverage per Amion 03/16/2020, 6:13 PM

## 2020-03-16 NOTE — Progress Notes (Signed)
Pt arrived from [facility]. [facility] admission paged. Purewick and Tele placed. Pt is alert. Bed is locked and alarm set. Pt oriented to room. RUA IV flushed and blood returned noted.

## 2020-03-17 DIAGNOSIS — Z789 Other specified health status: Secondary | ICD-10-CM

## 2020-03-17 DIAGNOSIS — R627 Adult failure to thrive: Secondary | ICD-10-CM

## 2020-03-17 DIAGNOSIS — R1310 Dysphagia, unspecified: Secondary | ICD-10-CM

## 2020-03-17 LAB — RENAL FUNCTION PANEL
Albumin: 2.6 g/dL — ABNORMAL LOW (ref 3.5–5.0)
Anion gap: 11 (ref 5–15)
BUN: 65 mg/dL — ABNORMAL HIGH (ref 8–23)
CO2: 23 mmol/L (ref 22–32)
Calcium: 9 mg/dL (ref 8.9–10.3)
Chloride: 104 mmol/L (ref 98–111)
Creatinine, Ser: 3.81 mg/dL — ABNORMAL HIGH (ref 0.44–1.00)
GFR calc Af Amer: 13 mL/min — ABNORMAL LOW (ref >60)
GFR calc non Af Amer: 11 mL/min — ABNORMAL LOW (ref >60)
Glucose, Bld: 223 mg/dL — ABNORMAL HIGH (ref 70–99)
Phosphorus: 4.6 mg/dL (ref 2.5–4.6)
Potassium: 3.9 mmol/L (ref 3.5–5.1)
Sodium: 138 mmol/L (ref 135–145)

## 2020-03-17 LAB — KAPPA/LAMBDA LIGHT CHAINS
Kappa free light chain: 80.3 mg/L — ABNORMAL HIGH (ref 3.3–19.4)
Kappa, lambda light chain ratio: 1.67 — ABNORMAL HIGH (ref 0.26–1.65)
Lambda free light chains: 48.1 mg/L — ABNORMAL HIGH (ref 5.7–26.3)

## 2020-03-17 LAB — CBC
HCT: 32.7 % — ABNORMAL LOW (ref 36.0–46.0)
Hemoglobin: 10.7 g/dL — ABNORMAL LOW (ref 12.0–15.0)
MCH: 28.9 pg (ref 26.0–34.0)
MCHC: 32.7 g/dL (ref 30.0–36.0)
MCV: 88.4 fL (ref 80.0–100.0)
Platelets: 203 10*3/uL (ref 150–400)
RBC: 3.7 MIL/uL — ABNORMAL LOW (ref 3.87–5.11)
RDW: 13.7 % (ref 11.5–15.5)
WBC: 11.3 10*3/uL — ABNORMAL HIGH (ref 4.0–10.5)
nRBC: 0 % (ref 0.0–0.2)

## 2020-03-17 LAB — GLUCOSE, CAPILLARY
Glucose-Capillary: 209 mg/dL — ABNORMAL HIGH (ref 70–99)
Glucose-Capillary: 198 mg/dL — ABNORMAL HIGH (ref 70–99)
Glucose-Capillary: 229 mg/dL — ABNORMAL HIGH (ref 70–99)
Glucose-Capillary: 218 mg/dL — ABNORMAL HIGH (ref 70–99)
Glucose-Capillary: 221 mg/dL — ABNORMAL HIGH (ref 70–99)

## 2020-03-17 LAB — MAGNESIUM: Magnesium: 1.6 mg/dL — ABNORMAL LOW (ref 1.7–2.4)

## 2020-03-17 LAB — PHOSPHORUS: Phosphorus: 4.5 mg/dL (ref 2.5–4.6)

## 2020-03-17 MED ORDER — SODIUM CHLORIDE 0.9 % IV SOLN: INTRAVENOUS | Status: AC

## 2020-03-17 NOTE — Progress Notes (Signed)
Inpatient Diabetes Program Recommendations  AACE/ADA: New Consensus Statement on Inpatient Glycemic Control (2015)  Target Ranges:  Prepandial:   less than 140 mg/dL      Peak postprandial:   less than 180 mg/dL (1-2 hours)      Critically ill patients:  140 - 180 mg/dL   Lab Results  Component Value Date   GLUCAP 198 (H) 03/17/2020   HGBA1C 6.9 (H) 02/15/2020    Review of Glycemic Control Results for [name], [name] (MRN [id number]) as of 03/17/2020 12:20  Ref. Range 03/16/2020 20:15 03/16/2020 23:34 03/17/2020 04:37 03/17/2020 08:02  Glucose-Capillary Latest Ref Range: 70 - 99 mg/dL 494 (H) 496 (H) 759 (H) 198 (H)  Diabetes history: Type 2 DM Outpatient Diabetes medications: Metformin 1000 mg QAM Current orders for Inpatient glycemic control: Levemir 4 units BID, Novolog 0-9 units q4H Osmolite @ 45 ml/hr  Inpatient Diabetes Program Recommendations:    Consider adding Novolog 2 units Q4H for tube feed coverage (to be stopped if tube feeds are stopped).   Thanks, Tasha Rave, MSN, RNC-OB Diabetes Coordinator [phone] (8a-5p)
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
## 2020-03-17 NOTE — Plan of Care (Signed)
Pt alert. Pt is on RA. TF are being tolerated.  Problem: Education: Goal: Knowledge of General Education information will improve Description: Including pain rating scale, medication(s)/side effects and non-pharmacologic comfort measures Outcome: Progressing   Problem: Health Behavior/Discharge Planning: Goal: Ability to manage health-related needs will improve Outcome: Progressing   Problem: Clinical Measurements: Goal: Ability to maintain clinical measurements within normal limits will improve Outcome: Progressing Goal: Will remain free from infection Outcome: Progressing Goal: Diagnostic test results will improve Outcome: Progressing Goal: Respiratory complications will improve Outcome: Progressing Goal: Cardiovascular complication will be avoided Outcome: Progressing   Problem: Activity: Goal: Risk for activity intolerance will decrease Outcome: Progressing   Problem: Nutrition: Goal: Adequate nutrition will be maintained Outcome: Progressing   Problem: Coping: Goal: Level of anxiety will decrease Outcome: Progressing   Problem: Elimination: Goal: Will not experience complications related to bowel motility Outcome: Progressing Goal: Will not experience complications related to urinary retention Outcome: Progressing   Problem: Pain Managment: Goal: General experience of comfort will improve Outcome: Progressing   Problem: Safety: Goal: Ability to remain free from injury will improve Outcome: Progressing   Problem: Skin Integrity: Goal: Risk for impaired skin integrity will decrease Outcome: Progressing

## 2020-03-17 NOTE — Progress Notes (Signed)
PROGRESS NOTE    Tasha [name]  KPT:[id number] DOB: [birth year] DOA: 03/08/2020 PCP: Patient, No Pcp Per    Chief Complaint  Patient presents with  . Emesis    Brief Narrative:   74 year old with a history of stroke, DM2, HTN, and MI who was discharged February 19, 2020 following a stroke work-up and return to the hospital 03/08/2020 with 2 days of intractable nausea and vomiting along with altered mental status.  She was found to be suffering with lactic acidosis and acute kidney injury.  Following admission the patient improved with supportive care.  Due to persisting right-sided weakness which was felt to be worse than previously documented an MRI was obtained and suggested an acute left pontine infarct and acute small infarct on the posterior body of the right lateral ventricle.  As result she was transferred from [facility] to [facility] for neurologic evaluation.  Subjective:  Very weak, attempt to follow command, nonverbal, tolerating tube feeds through cortrack Daughter at bedside   Assessment & Plan:   Active Problems:   CVA (cerebral vascular accident) (HCC)   DM (diabetes mellitus), type 2 with peripheral vascular complications (HCC)   Essential (primary) hypertension   Lactic acidosis   Nausea and vomiting   AKI (acute kidney injury) (HCC)   Hyperkalemia   Sepsis (HCC)   Coffee ground emesis  Acute CVA with history of recent CVA June 2021 EEG shows mild generalized slowing and triphasic waves but no definite seizure activity. Plan at discharge 02/19/2020 was aspirin plus Plavix x3 weeks then Plavix alone -Stroke Team signed off on July 28  Acute kidney injury Most consistent with prerenal state, w/ probable component of ATN - Nephrology evaluated patient while at [facility] - hopeful for continued slow recovery of renal fxn - avoid nephrotoxins   Acute toxic metabolic encephalopathy Due to primarily to uremia and possible complicated by lacunar acute CVA - mental  status has been waxing and waning since presentation - EEG unrevealing -TSH normal -B12 and folic acid not deficient  SIRS v/s Sepsis - POA Hypothermia, leukocytosis, and lactic acidosis present at admission -no clear source of infection identified -CXR at presentation with atelectasis only -CT abdomen pelvis without acute findings -UA not consistent with UTI and culture without growth -blood cultures no growth x2 -was treated with broad-spectrum empiric antibiotic for 7-day course - suspect this was primarily SIRS related to profound dehydration   Intractable nausea vomiting and coffee-ground emesis Hemoccult negative -probable Mallory-Weiss tear due to recurrent vomiting caused by uremia - all anticoagulants/antiplatelet meds held initially but resumed Plavix 7/17 and aspirin 7/19  Dysphagia Diet per SLP recommendations  DM 2 A1c 6.9  HTN Avoid overaggressive correction in setting of acute kidney injury and CVA  FTT, will need snf placement once medically stable   DVT prophylaxis: heparin injection 5,000 Units Start: 03/14/20 0915   Code Status: Full Family Communication: Daughter at bedside Disposition:   Status is: Inpatient   Dispo:  Patient From: Skilled Nursing Facility  Planned Disposition: Skilled Nursing Facility  Expected discharge date: 03/23/20  Medically stable for discharge: No, remain on tube feeds   Consultants:   Neurology, signed off on July 20  Nephrology  Procedures:   Core track placement for tube feeds  Antimicrobials:    Cefepime 7/12 > 7/13 Rocephin 7/14 > 7/18 Vancomycin 7/12      Objective: Vitals:   03/16/20 1547 03/16/20 1920 03/16/20 2305 03/17/20 0740  BP: (!) 151/71 (!) 125/98 (!) 153/62 Marland Kitchen)  175/75  Pulse: 71 77 69 89  Resp: 12 17 18 16   Temp: 97.7 F (36.5 C) 98.3 F (36.8 C) 98.7 F (37.1 C) 98.2 F (36.8 C)  TempSrc: Oral Oral Oral Oral  SpO2: 97% 99% 97% 94%  Weight:      Height:        Intake/Output  Summary (Last 24 hours) at 03/17/2020 1131 Last data filed at 03/16/2020 2030 Gross per 24 hour  Intake --  Output 500 ml  Net -500 ml   Filed Weights   03/14/20 0500 03/15/20 0453 03/16/20 0158  Weight: 63.8 kg 62.4 kg 60.5 kg    Examination:  General exam: Chronically ill-appearing, weak, nonverbal, does attempt follow command on the left side, core track for tube feeds Respiratory system: Diminished at bases , no rales, no rhonchi, no wheezing. Respiratory effort normal. Cardiovascular system: S1 & S2 heard, RRR. No JVD, no murmur, No pedal edema. Gastrointestinal system: Abdomen is nondistended, soft and nontender. No organomegaly or masses felt. Normal bowel sounds heard. Central nervous system: Alert , nonverbal ( previous documented dysarthria, she did not talk today), she attempted to follow command on left diet with very weak grip, she has minimal movement right upper and right lower extremity Extremities: as above Skin: No rashes, lesions or ulcers Psychiatry: flat affect    Data Reviewed: I have personally reviewed following labs and imaging studies  CBC: Recent Labs  Lab 03/11/20 0252 03/11/20 0252 03/12/20 0250 03/13/20 0251 03/14/20 0335 03/15/20 0450 03/17/20 0411  WBC 11.5*   < > 12.6* 12.4* 14.5* 12.3* 11.3*  NEUTROABS 8.8*  --  9.8* 9.5* 10.7* 9.3*  --   HGB 10.5*   < > 12.0 11.9* 11.9* 10.4* 10.7*  HCT 31.2*   < > 36.0 35.7* 35.2* 31.5* 32.7*  MCV 85.5   < > 86.5 86.7 86.3 87.5 88.4  PLT 180   < > 158 160 180 147* 203   < > = values in this interval not displayed.    Basic Metabolic Panel: Recent Labs  Lab 03/11/20 0252 03/11/20 0252 03/12/20 0250 03/12/20 0250 03/13/20 0251 03/14/20 0335 03/15/20 0450 03/16/20 1155 03/17/20 0411  NA 137   < > 137   < > 134* 139 137 138 138  K 3.4*   < > 4.2   < > 3.7 3.7 3.5 3.6 3.9  CL 86*   < > 89*   < > 93* 99 99 103 104  CO2 32   < > 27   < > 26 26 27 25 23   GLUCOSE 152*   < > 163*   < > 311* 180*  185* 198* 223*  BUN 112*   < > 103*   < > 99* 84* 75* 63* 65*  CREATININE 7.72*   < > 7.09*   < > 6.18* 5.49* 4.67*  4.53* 4.04* 3.81*  CALCIUM 6.7*   < > 7.5*   < > 7.9* 8.6* 8.7* 8.7* 9.0  MG 1.5*  --  2.6*  --  2.2 1.9  --   --  1.6*  PHOS 4.2   < > 3.9   < > 3.7  3.7 3.0  2.8 3.9 4.4 4.5  4.6   < > = values in this interval not displayed.    GFR: Estimated Creatinine Clearance: 11.4 mL/min (A) (by C-G formula based on SCr of 3.81 mg/dL (H)).  Liver Function Tests: Recent Labs  Lab 03/10/20 2319 03/10/20 2319 03/11/20 0252 03/11/20  2878 03/12/20 0250 03/12/20 0250 03/13/20 0251 03/14/20 0335 03/15/20 0450 03/16/20 1155 03/17/20 0411  AST 32  --  33  --  38  --   --  23 39  --   --   ALT 19  --  19  --  21  --   --  25 42  --   --   ALKPHOS 50  --  50  --  61  --   --  60 51  --   --   BILITOT 1.2  --  0.9  --  0.9  --   --  0.3 0.2*  --   --   PROT 5.5*  --  5.4*  --  6.2*  --   --  6.2* 5.8*  --   --   ALBUMIN 2.8*   < > 2.8*   < > 3.0*   < > 2.9* 2.8*  2.8* 2.7*  2.7* 2.4* 2.6*   < > = values in this interval not displayed.    CBG: Recent Labs  Lab 03/16/20 1550 03/16/20 2015 03/16/20 2334 03/17/20 0437 03/17/20 0802  GLUCAP 176* 200* 231* 209* 198*     Recent Results (from the past 240 hour(s))  SARS Coronavirus 2 by RT PCR (hospital order, performed in [facility] hospital lab) Nasopharyngeal Nasopharyngeal Swab     Status: None   Collection Time: 03/08/20  9:00 AM   Specimen: Nasopharyngeal Swab  Result Value Ref Range Status   SARS Coronavirus 2 NEGATIVE NEGATIVE Final    Comment: (NOTE) SARS-CoV-2 target nucleic acids are NOT DETECTED.  The SARS-CoV-2 RNA is generally detectable in upper and lower respiratory specimens during the acute phase of infection. The lowest concentration of SARS-CoV-2 viral copies this assay can detect is 250 copies / mL. A negative result does not preclude SARS-CoV-2 infection and should not be used as the sole  basis for treatment or other patient management decisions.  A negative result may occur with improper specimen collection / handling, submission of specimen other than nasopharyngeal swab, presence of viral mutation(s) within the areas targeted by this assay, and inadequate number of viral copies (<250 copies / mL). A negative result must be combined with clinical observations, patient history, and epidemiological information.  Fact Sheet for Patients:   BoilerBrush.com.cy  Fact Sheet for Healthcare Providers: https://pope.com/  This test is not yet approved or  cleared by the [place] FDA and has been authorized for detection and/or diagnosis of SARS-CoV-2 by FDA under an Emergency Use Authorization (EUA).  This EUA will remain in effect (meaning this test can be used) for the duration of the COVID-19 declaration under Section 564(b)(1) of the Act, 21 U.S.C. section 360bbb-3(b)(1), unless the authorization is terminated or revoked sooner.  Performed at [facility], [address], [place], [place] [address]   Culture, Urine     Status: None   Collection Time: 03/08/20 10:20 AM   Specimen: Urine, Random  Result Value Ref Range Status   Specimen Description   Final    URINE, RANDOM Performed at [facility], [address], [place], [place] [address]    Special Requests   Final    NONE Performed at [facility], [address], [place], [place] [address]    Culture   Final    NO GROWTH Performed at [facility], [address] [place], [place] [address]    Report Status 03/10/2020 FINAL  Final  Blood culture (routine x 2)     Status: None   Collection Time: 03/08/20 11:45 AM   Specimen: BLOOD  Result Value Ref Range Status   Specimen Description   Final    BLOOD RIGHT ANTECUBITAL Performed at [facility], [address],  [place], [place] [address]    Special Requests   Final    BOTTLES DRAWN AEROBIC AND ANAEROBIC Blood Culture adequate volume Performed at [facility], [address], [place], [place] [address]    Culture   Final    NO GROWTH 5 DAYS Performed at [facility], [address] [place], [place] [address]    Report Status 03/13/2020 FINAL  Final  Blood culture (routine x 2)     Status: None   Collection Time: 03/08/20 12:05 PM   Specimen: BLOOD  Result Value Ref Range Status   Specimen Description   Final    BLOOD LEFT WRIST Performed at [facility], [address], [place], [place] [address]    Special Requests   Final    BOTTLES DRAWN AEROBIC AND ANAEROBIC Blood Culture adequate volume Performed at [facility], [address] [place], [place] [address]    Culture   Final    NO GROWTH 5 DAYS Performed at [facility], [address] [place], [place] [address]    Report Status 03/13/2020 FINAL  Final  MRSA PCR Screening     Status: None   Collection Time: 03/08/20  1:47 PM   Specimen: Nasopharyngeal  Result Value Ref Range Status   MRSA by PCR NEGATIVE NEGATIVE Final    Comment:        The GeneXpert MRSA Assay (FDA approved for NASAL specimens only), is one component of a comprehensive MRSA colonization surveillance program. It is not intended to diagnose MRSA infection nor to guide or monitor treatment for MRSA infections. Performed at [facility], [address] [place], [place] [address]   MRSA PCR Screening     Status: None   Collection Time: 03/13/20 10:42 PM   Specimen: Nasopharyngeal  Result Value Ref Range Status   MRSA by PCR NEGATIVE NEGATIVE Final    Comment:        The GeneXpert MRSA Assay (FDA approved for NASAL specimens only), is one component of a comprehensive MRSA colonization surveillance program. It is not intended to diagnose MRSA infection nor to guide  or monitor treatment for MRSA infections. Performed at [facility], [address], [place], [place] [address]          Radiology Studies: EEG  Result Date: 03/15/2020 Charlsie Quest, MD     03/15/2020  4:47 PM Patient Name: Manreet Kiernan MRN: [id number] Epilepsy Attending: Charlsie Quest Referring Physician/Provider: Dr Caryl Pina Date: 03/15/2020 Duration: 25.50 mins Patient history: 73yo F with ams and acute left pontine stroke. EEG to evaluate for seizure. Level of alertness: Awake,/lethargic AEDs during EEG study: None Technical aspects: This EEG study was done with scalp electrodes positioned according to the 10-20 International system of electrode placement. Electrical activity was acquired at a sampling rate of  and reviewed with a high frequency filter of  and a low frequency filter of . EEG data were recorded continuously and digitally stored. Description: No posterior dominant rhythm. EEG showed continuous generalized 3 to 6 Hz theta-delta slowing as well as triphasic waves, generalized, maximal bifrontal. Hyperventilation and photic stimulation  were not performed.   ABNORMALITY -Continuous slow, generalized -Triphasic waves, generalized IMPRESSION: This study is suggestive of moderate diffuse encephalopathy, nonspecific etiology but likely related to toxic-metabolic etiology. No seizures or epileptiform discharges were seen throughout the recording. Priyanka Annabelle Harman Yadav        Scheduled Meds: . amLODipine  10 mg Per Tube Daily  . aspirin  81 mg Per Tube Daily  . chlorhexidine  15 mL Mouth Rinse BID  . clopidogrel  75 mg Per Tube Daily  . feeding supplement (PROSource TF)  45 mL Per Tube Daily  . free water  100 mL Per Tube Q6H  . heparin injection (subcutaneous)  5,000 Units Subcutaneous Q8H  . insulin aspart  0-9 Units Subcutaneous Q4H  . insulin detemir  4 Units Subcutaneous BID  . mouth rinse  15 mL Mouth Rinse BID  . metoprolol tartrate   25 mg Per Tube BID  . pantoprazole sodium  40 mg Per Tube Daily  . Ensure Max Protein  11 oz Oral BID  . rosuvastatin  5 mg Per Tube Daily   Continuous Infusions: . feeding supplement (OSMOLITE 1.2 CAL) 1,000 mL (03/17/20 0621)     LOS: 9 days     Time spent: 35mins I have personally reviewed and interpreted on  03/17/2020 daily labs, tele strips, imagings as discussed above under date review session and assessment and plans.  I reviewed all nursing notes, pharmacy notes, consultant notes,  vitals, pertinent old records  I have discussed plan of care as described above with RN , patient and family on 03/17/2020  Voice Recognition /Dragon dictation system was used to create this note, attempts have been made to correct errors. Please contact the author with questions and/or clarifications.   Albertine GratesFang Tinley Rought, MD PhD FACP [facility]  Available via Epic secure chat 7am-7pm for nonurgent issues Please page for urgent issues To page the attending provider between 7A-7P or the covering provider during after hours 7P-7A, please log into the web site [email or web address] and access using universal [place] password for that web site. If you do not have the password, please call the hospital operator.    03/17/2020, 11:31 AM

## 2020-03-17 NOTE — Evaluation (Signed)
Occupational Therapy Evaluation Patient Details Name: Tasha [name] MRN: [id number] DOB: [birth year] Today's Date: 03/17/2020    History of Present Illness Darenda Fike is a 74 y.o. female with history of stroke in June, HTN, MI and DM presenting 7/12 with nausea and vomiting, altered mental status, admitted w/ uremia and possible infection. In hospital, R sided weakness not improving, MRI showed new acute infarcts.    Clinical Impression   Pt admitted with the above diagnoses and presents with below problem list. Pt will benefit from continued acute OT to address the below listed deficits and maximize independence with basic ADLs prior to d/c back to SNF to continue rehab. Pt presents with significant change in functional status compared to previous admission last month. Pt is currently total A with ADLs, +2 physical assist with bed mobility and LB ADLs. Pt alert at start of session and able to hold eye contact but currently nonverbal and not following 1 step commands. Pt's daughter present at end of session and states that in the past couple of days pt has had brief moments of simple interaction with family (chuckle/smile at a TV show she likes). Discussed with nursing that pt would benefit from mattress/bed that can provide pressure relief due to her increased risk for skin breakdown.      Follow Up Recommendations  SNF    Equipment Recommendations       Recommendations for Other Services       Precautions / Restrictions Precautions Precautions: Fall Precaution Comments: right hemiplegia Restrictions Weight Bearing Restrictions: No      Mobility Bed Mobility Overal bed mobility: Needs Assistance Bed Mobility: Rolling Rolling: Total assist;+2 for physical assistance         General bed mobility comments: total A +2 to roll to each side. Lethargy and AMS (decreased command following/volition) possibly playing somewhat of a role  Transfers                 General  transfer comment: unable to safely attempt this session    Balance                                           ADL either performed or assessed with clinical judgement   ADL Overall ADL's : Needs assistance/impaired Eating/Feeding: Total assistance Eating/Feeding Details (indicate cue type and reason): has been receiving all nutrition via tube. cleared for liquids. Grooming: Total assistance   Upper Body Bathing: Total assistance   Lower Body Bathing: Total assistance;+2 for physical assistance   Upper Body Dressing : Total assistance   Lower Body Dressing: Total assistance;+2 for physical assistance                 General ADL Comments: total A for all ADLs at bed level     Vision Baseline Vision/History: Wears glasses Wears Glasses: At all times       Perception     Praxis      Pertinent Vitals/Pain Pain Assessment: Faces Pain Score: 0-No pain     Hand Dominance Right   Extremity/Trunk Assessment Upper Extremity Assessment Upper Extremity Assessment: Difficult to assess due to impaired cognition   Lower Extremity Assessment Lower Extremity Assessment: Defer to PT evaluation       Communication Communication Communication: Expressive difficulties;Receptive difficulties (non verbal and lethargic with moments of eye contact)   Cognition Arousal/Alertness: Lethargic Behavior During Therapy: Flat  affect Overall Cognitive Status: Difficult to assess                                     General Comments  Discussed with nurse pressure relief bed for pt as she is total +2 and at risk for skin breakdown.     Exercises     Shoulder Instructions      Home Living Family/patient expects to be discharged to:: Skilled nursing facility                                        Prior Functioning/Environment Level of Independence: Independent with assistive device(s)        Comments: total care since stroke in  6/21. Prior to that mod I with mobility and basic ADLs, h/o falls at baseline.         OT Problem List: Decreased strength;Decreased activity tolerance;Impaired balance (sitting and/or standing);Decreased coordination;Decreased cognition;Decreased safety awareness;Decreased knowledge of use of DME or AE;Impaired UE functional use;Decreased knowledge of precautions      OT Treatment/Interventions: Self-care/ADL training;Therapeutic exercise;Neuromuscular education;Energy conservation;DME and/or AE instruction;Therapeutic activities;Cognitive remediation/compensation;Patient/family education;Balance training    OT Goals(Current goals can be found in the care plan section) Acute Rehab OT Goals Patient Stated Goal: pt unable, daughter: pt back to SNF after hospital, increased alertness/ability to interact OT Goal Formulation: With family Time For Goal Achievement: 03/31/20 Potential to Achieve Goals: Fair ADL Goals Additional ADL Goal #1: Pt will complete rolling at mod +2 A level to facilitate bed level ADLs Additional ADL Goal #2: Pt will follow one step command 50% of the time with min cueing. Additional ADL Goal #3: Pt will complete grooming task at bed level with max A.  OT Frequency: Min 2X/week   Barriers to D/C:            Co-evaluation              AM-PAC OT "6 Clicks" Daily Activity     Outcome Measure Help from another person eating meals?: Total Help from another person taking care of personal grooming?: Total Help from another person toileting, which includes using toliet, bedpan, or urinal?: Total Help from another person bathing (including washing, rinsing, drying)?: Total Help from another person to put on and taking off regular upper body clothing?: Total Help from another person to put on and taking off regular lower body clothing?: Total 6 Click Score: 6   End of Session Nurse Communication: Mobility status;Other (comment) (would benefit from pressure relief  mattress/bed)  Activity Tolerance: Patient limited by lethargy Patient left: in bed;with call bell/phone within reach;with bed alarm set;with family/visitor present  OT Visit Diagnosis: Unsteadiness on feet (R26.81);History of falling (Z91.81);Muscle weakness (generalized) (M62.81);Other symptoms and signs involving the nervous system (R29.898);Other symptoms and signs involving cognitive function;Cognitive communication deficit (R41.841);Other abnormalities of gait and mobility (R26.89)                Time: 0272-5366 OT Time Calculation (min): 29 min Charges:  OT General Charges $OT Visit: 1 Visit OT Evaluation $OT Eval Moderate Complexity: 1 Mod  Raynald Kemp, OT Acute Rehabilitation Services Pager: [phone] Office: [phone]   Pilar Grammes 03/17/2020, 2:23 PM

## 2020-03-17 NOTE — Progress Notes (Signed)
Patient ID: Tasha [name], female   DOB: 24-Sep-1945, 74 y.o.   MRN: [id number] [facility] Progress Note   Assessment/ Plan:   1. Acute kidney Injury: Nonoliguric,acute injury thought to have been from sustained prerenal state with evolution to ATN (clinically suspected with component of sepsis). Renal function continues to show improvement with downtrending BUN/creatinine  - We were re-consulted this week as per neuro small vessel vasculitis in differential thus considering if possible etiology of renal disease as well.  Note complement normal and ANCA and ANA negative.  UA without RBC - AKI has been improving with supportive care and at this time serologies do not suggest vasculitis  - In setting of new CVA would prefer to defer renal biopsy and continue supportive care - Trial of NS at 75 /hr x 24 hours to optimize - would not resume HCTZ on discharge   2.  Possible sepsis: With leukocytosis/hypothermia and acute kidney injury.  Initially on broad-spectrum antimicrobial coverage with vancomycin and cefepime that has been narrowed down per primary team  3.  Proteinuria - subnephrotic range.  May be secondary to patient's DM.  GN w/u as above thus far is negative.  SPEP and free light chains sent - if M spike present or if free light chain ratio above 2 would need referral to hem/onc    4.  Altered mental status: Likely secondary to sepsis versus azotemia also now s/p CVA  5.  CVA - new CVA this admission for which she was transferred to [facility] - per neuro   6.  Hypertension: given new CVA defer blood pressure management to primary team and neuro   Nephrology will sign off.  I will set up follow-up with me at [facility] for two weeks after her discharge.      Subjective:     Patient is not really speaking today - near her recent baseline per her daughter at baseline.  Her daughter states pt does have DM.  We discussed renal follow-up and they would like her to see  nephrology here in [place].   Review of systems: Unable to obtain 2/2 patient factors.  She is not able to provide answers for me today   Objective:   BP (!) 175/75 (BP Location: Left Arm)    Pulse 89    Temp 98.2 F (36.8 C) (Oral)    Resp 16    Ht 5\' 4"  (1.626 m)    Wt 60.5 kg    SpO2 94%    BMI 22.89 kg/m   Intake/Output Summary (Last 24 hours) at 03/17/2020 1140 Last data filed at 03/16/2020 2030 Gross per 24 hour  Intake --  Output 500 ml  Net -500 ml   Weight change:   Physical Exam: Gen: Appears comfortable resting in bed. CVS: S1 and S2 no rub  Resp: Clear to auscultation, no distinct rales or rhonchi Abd: Soft, nontender, ND Ext: No lower extremity edema pysch no anxiety or agitation.   Neuro - patient is not verbal for me today.  She is interactive/tracks. daughter at bedside states at her baseline as of recently this hospitalization  Imaging: EEG  Result Date: 03/15/2020 03/17/2020, MD     03/15/2020  4:47 PM Patient Name: Tasha [name] MRN: [facility] Attending: [id number] Referring Physician/Provider: Dr Charlsie Quest Date: 03/15/2020 Duration: 25.50 mins Patient history: 73yo F with ams and acute left pontine stroke. EEG to evaluate for seizure. Level of alertness: Awake,/lethargic AEDs during EEG study:  None Technical aspects: This EEG study was done with scalp electrodes positioned according to the 10-20 International system of electrode placement. Electrical activity was acquired at a sampling rate of 500Hz  and reviewed with a high frequency filter of 70Hz  and a low frequency filter of 1Hz . EEG data were recorded continuously and digitally stored. Description: No posterior dominant rhythm. EEG showed continuous generalized 3 to 6 Hz theta-delta slowing as well as triphasic waves, generalized, maximal bifrontal. Hyperventilation and photic stimulation were not performed.   ABNORMALITY -Continuous slow, generalized -Triphasic waves, generalized  IMPRESSION: This study is suggestive of moderate diffuse encephalopathy, nonspecific etiology but likely related to toxic-metabolic etiology. No seizures or epileptiform discharges were seen throughout the recording.    Labs: BMET Recent Labs  Lab 03/11/20 0252 03/12/20 0250 03/13/20 0251 03/14/20 0335 03/15/20 0450 03/16/20 1155 03/17/20 0411  NA 137 137 134* 139 137 138 138  K 3.4* 4.2 3.7 3.7 3.5 3.6 3.9  CL 86* 89* 93* 99 99 103 104  CO2 32 27 26 26 27 25 23   GLUCOSE 152* 163* 311* 180* 185* 198* 223*  BUN 112* 103* 99* 84* 75* 63* 65*  CREATININE 7.72* 7.09* 6.18* 5.49* 4.67*   4.53* 4.04* 3.81*  CALCIUM 6.7* 7.5* 7.9* 8.6* 8.7* 8.7* 9.0  PHOS 4.2 3.9 3.7   3.7 3.0   2.8 3.9 4.4 4.5   4.6   CBC Recent Labs  Lab 03/12/20 0250 03/12/20 0250 03/13/20 0251 03/14/20 0335 03/15/20 0450 03/17/20 0411  WBC 12.6*   < > 12.4* 14.5* 12.3* 11.3*  NEUTROABS 9.8*  --  9.5* 10.7* 9.3*  --   HGB 12.0   < > 11.9* 11.9* 10.4* 10.7*  HCT 36.0   < > 35.7* 35.2* 31.5* 32.7*  MCV 86.5   < > 86.7 86.3 87.5 88.4  PLT 158   < > 160 180 147* 203   < > = values in this interval not displayed.    Medications:     amLODipine  10 mg Per Tube Daily   aspirin  81 mg Per Tube Daily   chlorhexidine  15 mL Mouth Rinse BID   clopidogrel  75 mg Per Tube Daily   feeding supplement (PROSource TF)  45 mL Per Tube Daily   free water  100 mL Per Tube Q6H   heparin injection (subcutaneous)  5,000 Units Subcutaneous Q8H   insulin aspart  0-9 Units Subcutaneous Q4H   insulin detemir  4 Units Subcutaneous BID   mouth rinse  15 mL Mouth Rinse BID   metoprolol tartrate  25 mg Per Tube BID   pantoprazole sodium  40 mg Per Tube Daily   Ensure Max Protein  11 oz Oral BID   rosuvastatin  5 mg Per Tube Daily   03/15/20, MD 03/17/2020, 11:48 AM

## 2020-03-18 LAB — GLUCOSE, CAPILLARY
Glucose-Capillary: 176 mg/dL — ABNORMAL HIGH (ref 70–99)
Glucose-Capillary: 180 mg/dL — ABNORMAL HIGH (ref 70–99)
Glucose-Capillary: 199 mg/dL — ABNORMAL HIGH (ref 70–99)
Glucose-Capillary: 207 mg/dL — ABNORMAL HIGH (ref 70–99)
Glucose-Capillary: 221 mg/dL — ABNORMAL HIGH (ref 70–99)
Glucose-Capillary: 236 mg/dL — ABNORMAL HIGH (ref 70–99)
Glucose-Capillary: 236 mg/dL — ABNORMAL HIGH (ref 70–99)

## 2020-03-18 LAB — BASIC METABOLIC PANEL
Anion gap: 11 (ref 5–15)
BUN: 63 mg/dL — ABNORMAL HIGH (ref 8–23)
CO2: 24 mmol/L (ref 22–32)
Calcium: 9.2 mg/dL (ref 8.9–10.3)
Chloride: 107 mmol/L (ref 98–111)
Creatinine, Ser: 3.07 mg/dL — ABNORMAL HIGH (ref 0.44–1.00)
GFR calc Af Amer: 17 mL/min — ABNORMAL LOW (ref >60)
GFR calc non Af Amer: 14 mL/min — ABNORMAL LOW (ref >60)
Glucose, Bld: 214 mg/dL — ABNORMAL HIGH (ref 70–99)
Potassium: 4 mmol/L (ref 3.5–5.1)
Sodium: 142 mmol/L (ref 135–145)

## 2020-03-18 LAB — PROTEIN ELECTROPHORESIS, SERUM
A/G Ratio: 0.9 (ref 0.7–1.7)
Albumin ELP: 2.7 g/dL — ABNORMAL LOW (ref 2.9–4.4)
Alpha-1-Globulin: 0.4 g/dL (ref 0.0–0.4)
Alpha-2-Globulin: 1.1 g/dL — ABNORMAL HIGH (ref 0.4–1.0)
Beta Globulin: 0.9 g/dL (ref 0.7–1.3)
Gamma Globulin: 0.7 g/dL (ref 0.4–1.8)
Globulin, Total: 3.1 g/dL (ref 2.2–3.9)
Total Protein ELP: 5.8 g/dL — ABNORMAL LOW (ref 6.0–8.5)

## 2020-03-18 LAB — CBC WITH DIFFERENTIAL/PLATELET
Abs Immature Granulocytes: 0.19 10*3/uL — ABNORMAL HIGH (ref 0.00–0.07)
Basophils Absolute: 0 10*3/uL (ref 0.0–0.1)
Basophils Relative: 0 %
Eosinophils Absolute: 0.5 10*3/uL (ref 0.0–0.5)
Eosinophils Relative: 5 %
HCT: 31.4 % — ABNORMAL LOW (ref 36.0–46.0)
Hemoglobin: 10 g/dL — ABNORMAL LOW (ref 12.0–15.0)
Immature Granulocytes: 2 %
Lymphocytes Relative: 10 %
Lymphs Abs: 1 10*3/uL (ref 0.7–4.0)
MCH: 28.3 pg (ref 26.0–34.0)
MCHC: 31.8 g/dL (ref 30.0–36.0)
MCV: 89 fL (ref 80.0–100.0)
Monocytes Absolute: 1 10*3/uL (ref 0.1–1.0)
Monocytes Relative: 9 %
Neutro Abs: 8 10*3/uL — ABNORMAL HIGH (ref 1.7–7.7)
Neutrophils Relative %: 74 %
Platelets: 208 10*3/uL (ref 150–400)
RBC: 3.53 MIL/uL — ABNORMAL LOW (ref 3.87–5.11)
RDW: 13.9 % (ref 11.5–15.5)
WBC: 10.7 10*3/uL — ABNORMAL HIGH (ref 4.0–10.5)
nRBC: 0 % (ref 0.0–0.2)

## 2020-03-18 LAB — MAGNESIUM: Magnesium: 1.5 mg/dL — ABNORMAL LOW (ref 1.7–2.4)

## 2020-03-18 MED ORDER — METOPROLOL TARTRATE 25 MG/10 ML ORAL SUSPENSION
50.0000 mg | Freq: Two times a day (BID) | ORAL | Status: DC
  Administered 2020-03-18 – 2020-03-19 (×2): 50 mg
  Filled 2020-03-18 (×3): qty 20

## 2020-03-18 MED ORDER — MAGNESIUM SULFATE 2 GM/50ML IV SOLN
2.0000 g | Freq: Once | INTRAVENOUS | Status: AC
  Administered 2020-03-18: 2 g via INTRAVENOUS
  Filled 2020-03-18: qty 50

## 2020-03-18 NOTE — Plan of Care (Signed)
Pt alert. TF continuous and tolerable. Pt now on a air mattress. Pt is on RA.   Problem: Education: Goal: Knowledge of General Education information will improve Description: Including pain rating scale, medication(s)/side effects and non-pharmacologic comfort measures Outcome: Progressing   Problem: Health Behavior/Discharge Planning: Goal: Ability to manage health-related needs will improve Outcome: Progressing   Problem: Clinical Measurements: Goal: Ability to maintain clinical measurements within normal limits will improve Outcome: Progressing Goal: Will remain free from infection Outcome: Progressing Goal: Diagnostic test results will improve Outcome: Progressing Goal: Respiratory complications will improve Outcome: Progressing Goal: Cardiovascular complication will be avoided Outcome: Progressing   Problem: Activity: Goal: Risk for activity intolerance will decrease Outcome: Progressing   Problem: Nutrition: Goal: Adequate nutrition will be maintained Outcome: Progressing   Problem: Coping: Goal: Level of anxiety will decrease Outcome: Progressing   Problem: Elimination: Goal: Will not experience complications related to bowel motility Outcome: Progressing Goal: Will not experience complications related to urinary retention Outcome: Progressing   Problem: Pain Managment: Goal: General experience of comfort will improve Outcome: Progressing   Problem: Safety: Goal: Ability to remain free from injury will improve Outcome: Progressing   Problem: Skin Integrity: Goal: Risk for impaired skin integrity will decrease Outcome: Progressing

## 2020-03-18 NOTE — Progress Notes (Signed)
Physical Therapy Treatment Patient Details Name: Tasha [name] MRN: [id number] DOB: 03-01-1946 Today's Date: 03/18/2020    History of Present Illness Tasha [name] is a 74 y.o. female with history of stroke in June, HTN, MI and DM presenting 7/12 with nausea and vomiting, altered mental status, admitted w/ uremia and possible infection. In hospital, R sided weakness not improving, MRI showed new acute infarcts.     PT Comments    Pt in bed upon arrival of PT, agreeable to session with focus on progressing seated balance and strengthening. The pt continues to depend on max/totalA of 2 to safely transition to sitting EOB, but min/modA to maintain static sitting. The pt was unable to actively participate in exercises for the LE, she demos no pain or discomfort with her RLE, but grimaces with movement of LLE. The pt will continue to benefit from mobilizing to chair with lift and skilled PT to progress functional strength to improve pt activity in transfers and mobility.    Follow Up Recommendations  SNF     Equipment Recommendations  None recommended by PT    Recommendations for Other Services       Precautions / Restrictions Precautions Precautions: Fall Precaution Comments: right hemiplegia Restrictions Weight Bearing Restrictions: No    Mobility  Bed Mobility Overal bed mobility: Needs Assistance Bed Mobility: Rolling;Supine to Sit;Sit to Supine Rolling: Max assist   Supine to sit: Max assist;+2 for physical assistance;HOB elevated Sit to supine: Max assist;+2 for physical assistance   General bed mobility comments: maxA to move BLE to EOB, then maxA of 2 to transition to sitting EOB where the pt was able to sustain with min/modA. maxA to safely return to supine following sitting EOB  Transfers Overall transfer level:  (unable to progress due to pt inability to initiate movement in extremities on command)               General transfer comment: unable to safely attempt  this session      Balance Overall balance assessment: History of Falls;Needs assistance Sitting-balance support: No upper extremity supported;Feet supported Sitting balance-Leahy Scale: Fair Sitting balance - Comments: min/modA to maintain sitting EOB                                    Cognition Arousal/Alertness: Awake/alert Behavior During Therapy: Flat affect Overall Cognitive Status: Difficult to assess Area of Impairment: Attention;Memory;Following commands;Awareness                   Current Attention Level: Focused   Following Commands: Follows one step commands inconsistently   Awareness: Intellectual Problem Solving: Slow processing;Decreased initiation;Difficulty sequencing;Requires verbal cues;Requires tactile cues General Comments: patient tearful, unable to express self, inconsistent with small nods for yes/no. said a few words such as correct pronunciation of name in her tattoo. unable to answer direct questions such as "do you want your tv on?"      Exercises General Exercises - Lower Extremity Ankle Circles/Pumps: PROM;Both;5 reps;Supine Long Arc Quad: PROM;Both;10 reps Heel Slides: PROM;Both;10 reps Other Exercises Other Exercises: Lateral lean to R elbow, x5. manual facilitation at the pelvis to return to sitting EOB    General Comments General comments (skin integrity, edema, etc.): Pt on pressure relief bed, discussed need for soft touch call bell as pt was unable to press current call bell      Pertinent Vitals/Pain Pain Assessment: Faces Faces Pain Scale:  Hurts little more Pain Location: grimaching with movement of LLE and LUE, transition to EOB. pt unable to vocalize or indicate location Pain Descriptors / Indicators: Grimacing;Crying Pain Intervention(s): Monitored during session;Limited activity within patient's tolerance;Repositioned     PT Goals (current goals can now be found in the care plan section) Acute Rehab PT  Goals Patient Stated Goal: pt unable, daughter: pt back to SNF after hospital, increased alertness/ability to interact PT Goal Formulation: Patient unable to participate in goal setting Time For Goal Achievement: 03/24/20 Potential to Achieve Goals: Poor Progress towards PT goals: Not progressing toward goals - comment    Frequency    Min 2X/week      PT Plan Current plan remains appropriate       AM-PAC PT "6 Clicks" Mobility   Outcome Measure  Help needed turning from your back to your side while in a flat bed without using bedrails?: Total Help needed moving from lying on your back to sitting on the side of a flat bed without using bedrails?: Total Help needed moving to and from a bed to a chair (including a wheelchair)?: Total Help needed standing up from a chair using your arms (e.g., wheelchair or bedside chair)?: Total Help needed to walk in hospital room?: Total Help needed climbing 3-5 steps with a railing? : Total 6 Click Score: 6    End of Session   Activity Tolerance: Patient tolerated treatment well Patient left: in bed;with call bell/phone within reach;with bed alarm set Nurse Communication: Mobility status;Need for lift equipment PT Visit Diagnosis: Hemiplegia and hemiparesis;Repeated falls (R29.6);Other symptoms and signs involving the nervous system (R29.898) Hemiplegia - caused by: Cerebral infarction     Time: 1348-1410 PT Time Calculation (min) (ACUTE ONLY): 22 min  Charges:  $Therapeutic Exercise: 8-22 mins                     Rolm Baptise, PT, DPT   Acute Rehabilitation Department Pager #: [phone]   Gaetana Michaelis 03/18/2020, 3:31 PM

## 2020-03-18 NOTE — Progress Notes (Addendum)
PROGRESS NOTE    Tasha [name]  HQI:[id number] DOB: [birth year] DOA: 03/08/2020 PCP: Patient, No Pcp Per    Chief Complaint  Patient presents with  . Emesis    Brief Narrative:   74 year old with a history of stroke, DM2, HTN, and MI who was discharged February 19, 2020 following a stroke work-up and return to the hospital 03/08/2020 with 2 days of intractable nausea and vomiting along with altered mental status.  She was found to be suffering with lactic acidosis and acute kidney injury.  Following admission the patient improved with supportive care.  Due to persisting right-sided weakness which was felt to be worse than previously documented an MRI was obtained  On 7/18 and suggested an acute left pontine infarct and acute small infarct on the posterior body of the right lateral ventricle.  As result she was transferred from [facility] to [facility] for neurologic evaluation.  Subjective:  Very weak, does follow command, she is able to tell me her birthday today   tolerating tube feeds through cortrack Blood pressure running on the higher side  Assessment & Plan:   Active Problems:   CVA (cerebral vascular accident) (HCC)   DM (diabetes mellitus), type 2 with peripheral vascular complications (HCC)   Essential (primary) hypertension   Lactic acidosis   Nausea and vomiting   AKI (acute kidney injury) (HCC)   Hyperkalemia   Sepsis (HCC)   Coffee ground emesis  Acute CVA  On 7/18 with history of recent CVA June 2021 EEG shows mild generalized slowing and triphasic waves but no definite seizure activity. Per neurology: patient was on clopidogrel 75 mg daily prior to admission, now on aspirin 81 mg daily and clopidogrel 75 mg daily. Continue DAPT x 3 weeks again then plavix alone She is also on crestor Stroke Team signed off on July 28  Acute kidney injury Most consistent with prerenal state, w/ probable component of ATN - Nephrology evaluated patient while at [facility] -  hopeful for continued slow recovery of renal fxn - avoid nephrotoxins   Acute toxic metabolic encephalopathy Due to primarily to uremia and possible complicated by lacunar acute CVA - mental status has been waxing and waning since presentation - EEG unrevealing -TSH normal -B12 and folic acid not deficient  SIRS v/s Sepsis - POA Hypothermia, leukocytosis, and lactic acidosis present at admission -no clear source of infection identified -CXR at presentation with atelectasis only -CT abdomen pelvis without acute findings -UA not consistent with UTI and culture without growth -blood cultures no growth x2 -was treated with broad-spectrum empiric antibiotic for 7-day course - suspect this was primarily SIRS related to profound dehydration   Intractable nausea vomiting and coffee-ground emesis ( presenting symptom) Hemoccult negative -probable Mallory-Weiss tear due to recurrent vomiting caused by uremia - all anticoagulants/antiplatelet meds held initially but resumed Plavix 7/17 and aspirin 7/19  Dysphagia Diet per SLP recommendations Diet recommendations:  (full liquid) Liquids provided via: Cup;Straw Medication Administration: Via alternative means Supervision: Full supervision/cueing for compensatory strategies Compensations: Slow rate;Small sips/bites;Minimize environmental distractions Postural Changes and/or Swallow Maneuvers: Seated upright 90 degrees;Upright 30-60 min after meal  Hypomagnesemia, IV mag today, repeat lab in the morning  Noninsulin-dependent type II DM 2, controlled, A1c 6.9 Home medication Metformin held She has hypoglycemia in the hospital , she was started on Levemir 4 units twice a day and insulin sliding scale since July 17 She has been on osmolite tube feeds, i requested nutrition to change tube feeds from Osmolite  to Glucerna, hopefully that will help regulate blood glucose.  HTN Allowed permission hypertension initially, bp starts to trend up, will  increase lopressor   FTT, will need snf placement once medically stable, she continue requiring feeding tube for nutrition   DVT prophylaxis: heparin injection 5,000 Units Start: 03/14/20 0915   Code Status: Full Family Communication: Daughter at bedside on 7/21 Disposition:   Status is: Inpatient   Dispo:  Patient From: Skilled Nursing Facility  Planned Disposition: Skilled Nursing Facility  Expected discharge date: 03/23/20  Medically stable for discharge: No, remains on tube feeds   Consultants:   Neurology, signed off on July 20  Nephrology  Procedures:   Core track placement for tube feeds  Antimicrobials:    Cefepime 7/12 > 7/13 Rocephin 7/14 > 7/18 Vancomycin 7/12      Objective: Vitals:   03/17/20 1735 03/17/20 2019 03/18/20 0029 03/18/20 0342  BP: (!) 159/65 (!) 166/77 (!) 174/97 (!) 167/81  Pulse: 90 80 84 81  Resp: 16 18 17 18   Temp: 98.1 F (36.7 C) 99 F (37.2 C) 99.7 F (37.6 C) 98.5 F (36.9 C)  TempSrc: Oral Oral Oral Oral  SpO2: 100% 100% 100% 97%  Weight:      Height:        Intake/Output Summary (Last 24 hours) at 03/18/2020 0703 Last data filed at 03/18/2020 0342 Gross per 24 hour  Intake 333.54 ml  Output 450 ml  Net -116.46 ml   Filed Weights   03/14/20 0500 03/15/20 0453 03/16/20 0158  Weight: 63.8 kg 62.4 kg 60.5 kg    Examination:  General exam: Chronically ill-appearing, weak,  follow command on the left side, core track for tube feeds Respiratory system: Diminished at bases , no rales, no rhonchi, no wheezing. Respiratory effort normal. Cardiovascular system: S1 & S2 heard, RRR. No JVD, no murmur, No pedal edema. Gastrointestinal system: Abdomen is nondistended, soft and nontender. No organomegaly or masses felt. Normal bowel sounds heard. Central nervous system: Alert , talk a few words,  follow command on left diet with very weak grip, she has minimal movement right upper and right lower extremity Extremities:  as above, right hand dependent edema, no edema in legs Skin: No rashes, lesions or ulcers Psychiatry: flat affect    Data Reviewed: I have personally reviewed following labs and imaging studies  CBC: Recent Labs  Lab 03/12/20 0250 03/13/20 0251 03/14/20 0335 03/15/20 0450 03/17/20 0411  WBC 12.6* 12.4* 14.5* 12.3* 11.3*  NEUTROABS 9.8* 9.5* 10.7* 9.3*  --   HGB 12.0 11.9* 11.9* 10.4* 10.7*  HCT 36.0 35.7* 35.2* 31.5* 32.7*  MCV 86.5 86.7 86.3 87.5 88.4  PLT 158 160 180 147* 203    Basic Metabolic Panel: Recent Labs  Lab 03/12/20 0250 03/12/20 0250 03/13/20 0251 03/13/20 0251 03/14/20 0335 03/15/20 0450 03/16/20 1155 03/17/20 0411 03/18/20 0519  NA 137   < > 134*   < > 139 137 138 138 142  K 4.2   < > 3.7   < > 3.7 3.5 3.6 3.9 4.0  CL 89*   < > 93*   < > 99 99 103 104 107  CO2 27   < > 26   < > 26 27 25 23 24   GLUCOSE 163*   < > 311*   < > 180* 185* 198* 223* 214*  BUN 103*   < > 99*   < > 84* 75* 63* 65* 63*  CREATININE 7.09*   < >  6.18*   < > 5.49* 4.67*  4.53* 4.04* 3.81* 3.07*  CALCIUM 7.5*   < > 7.9*   < > 8.6* 8.7* 8.7* 9.0 9.2  MG 2.6*  --  2.2  --  1.9  --   --  1.6* 1.5*  PHOS 3.9   < > 3.7  3.7  --  3.0  2.8 3.9 4.4 4.5  4.6  --    < > = values in this interval not displayed.    GFR: Estimated Creatinine Clearance: 14.1 mL/min (A) (by C-G formula based on SCr of 3.07 mg/dL (H)).  Liver Function Tests: Recent Labs  Lab 03/12/20 0250 03/12/20 0250 03/13/20 0251 03/14/20 0335 03/15/20 0450 03/16/20 1155 03/17/20 0411  AST 38  --   --  23 39  --   --   ALT 21  --   --  25 42  --   --   ALKPHOS 61  --   --  60 51  --   --   BILITOT 0.9  --   --  0.3 0.2*  --   --   PROT 6.2*  --   --  6.2* 5.8*  --   --   ALBUMIN 3.0*   < > 2.9* 2.8*  2.8* 2.7*  2.7* 2.4* 2.6*   < > = values in this interval not displayed.    CBG: Recent Labs  Lab 03/17/20 1241 03/17/20 1732 03/17/20 2017 03/18/20 0028 03/18/20 0341  GLUCAP 229* 218* 221* 207*  199*     Recent Results (from the past 240 hour(s))  SARS Coronavirus 2 by RT PCR (hospital order, performed in [facility] hospital lab) Nasopharyngeal Nasopharyngeal Swab     Status: None   Collection Time: 03/08/20  9:00 AM   Specimen: Nasopharyngeal Swab  Result Value Ref Range Status   SARS Coronavirus 2 NEGATIVE NEGATIVE Final    Comment: (NOTE) SARS-CoV-2 target nucleic acids are NOT DETECTED.  The SARS-CoV-2 RNA is generally detectable in upper and lower respiratory specimens during the acute phase of infection. The lowest concentration of SARS-CoV-2 viral copies this assay can detect is 250 copies / mL. A negative result does not preclude SARS-CoV-2 infection and should not be used as the sole basis for treatment or other patient management decisions.  A negative result may occur with improper specimen collection / handling, submission of specimen other than nasopharyngeal swab, presence of viral mutation(s) within the areas targeted by this assay, and inadequate number of viral copies (<250 copies / mL). A negative result must be combined with clinical observations, patient history, and epidemiological information.  Fact Sheet for Patients:   BoilerBrush.com.cy  Fact Sheet for Healthcare Providers: https://pope.com/  This test is not yet approved or  cleared by the [place] FDA and has been authorized for detection and/or diagnosis of SARS-CoV-2 by FDA under an Emergency Use Authorization (EUA).  This EUA will remain in effect (meaning this test can be used) for the duration of the COVID-19 declaration under Section 564(b)(1) of the Act, 21 U.S.C. section 360bbb-3(b)(1), unless the authorization is terminated or revoked sooner.  Performed at [facility], [address] [place], [place] [address]   Culture, Urine     Status: None   Collection Time: 03/08/20 10:20 AM   Specimen: Urine, Random    Result Value Ref Range Status   Specimen Description   Final    URINE, RANDOM Performed at [facility], [address] [place], [place] [address]    Special Requests   Final    NONE Performed at [facility], [address] [place], [place] [address]    Culture   Final    NO GROWTH Performed at [facility], [address], [place], [place] [address]    Report Status 03/10/2020 FINAL  Final  Blood culture (routine x 2)     Status: None   Collection Time: 03/08/20 11:45 AM   Specimen: BLOOD  Result Value Ref Range Status   Specimen Description   Final    BLOOD RIGHT ANTECUBITAL Performed at [facility], [address] [place], [place] [address]    Special Requests   Final    BOTTLES DRAWN AEROBIC AND ANAEROBIC Blood Culture adequate volume Performed at [facility], [address] [place], [place] [address]    Culture   Final    NO GROWTH 5 DAYS Performed at [facility], [address], [place], [place] [address]    Report Status 03/13/2020 FINAL  Final  Blood culture (routine x 2)     Status: None   Collection Time: 03/08/20 12:05 PM   Specimen: BLOOD  Result Value Ref Range Status   Specimen Description   Final    BLOOD LEFT WRIST Performed at [facility], [address], [place], [place] [address]    Special Requests   Final    BOTTLES DRAWN AEROBIC AND ANAEROBIC Blood Culture adequate volume Performed at [facility], [address] [place], [place] [address]    Culture   Final    NO GROWTH 5 DAYS Performed at [facility], [address] [place], [place] [address]    Report Status 03/13/2020 FINAL  Final  MRSA PCR Screening     Status: None   Collection Time: 03/08/20  1:47 PM   Specimen: Nasopharyngeal  Result Value Ref Range Status   MRSA by PCR NEGATIVE NEGATIVE Final    Comment:        The GeneXpert MRSA Assay  (FDA approved for NASAL specimens only), is one component of a comprehensive MRSA colonization surveillance program. It is not intended to diagnose MRSA infection nor to guide or monitor treatment for MRSA infections. Performed at [facility], [address], [place], [place] [address]   MRSA PCR Screening     Status: None   Collection Time: 03/13/20 10:42 PM   Specimen: Nasopharyngeal  Result Value Ref Range Status   MRSA by PCR NEGATIVE NEGATIVE Final    Comment:        The GeneXpert MRSA Assay (FDA approved for NASAL specimens only), is one component of a comprehensive MRSA colonization surveillance program. It is not intended to diagnose MRSA infection nor to guide or monitor treatment for MRSA infections. Performed at [facility], [address], [place], [place] [address]          Radiology Studies: No results found.      Scheduled Meds: . amLODipine  10 mg Per Tube Daily  . aspirin  81 mg Per Tube Daily  . chlorhexidine  15 mL Mouth Rinse BID  . clopidogrel  75 mg Per Tube Daily  . feeding supplement (PROSource TF)  45 mL Per Tube Daily  . free water  100 mL Per Tube Q6H  . heparin injection (subcutaneous)  5,000 Units Subcutaneous Q8H  . insulin aspart  0-9 Units Subcutaneous Q4H  . insulin detemir  4 Units Subcutaneous BID  . mouth rinse  15 mL Mouth Rinse BID  . metoprolol tartrate  25 mg Per Tube BID  . pantoprazole sodium  40 mg Per Tube Daily  . Ensure Max Protein  11 oz Oral BID  . rosuvastatin  5 mg Per Tube Daily   Continuous Infusions: . sodium chloride 75 mL/hr at 03/18/20 0343  . feeding supplement (OSMOLITE 1.2 CAL) 1,000 mL (03/17/20 0621)     LOS: 10 days     Time spent: 35mins I have personally reviewed and interpreted on  03/18/2020 daily labs, tele strips, imagings as discussed above under date review session and assessment and plans.  I reviewed all nursing notes, pharmacy notes,  consultant notes,  vitals, pertinent old records  I have discussed plan of care as described above with RN , patient and family on 03/18/2020  Voice Recognition /Dragon dictation system was used to create this note, attempts have been made to correct errors. Please contact the author with questions and/or clarifications.   Albertine GratesFang Makayleigh Poliquin, MD PhD FACP [facility]  Available via Epic secure chat 7am-7pm for nonurgent issues Please page for urgent issues To page the attending provider between 7A-7P or the covering provider during after hours 7P-7A, please log into the web site [email or web address] and access using universal [place] password for that web site. If you do not have the password, please call the hospital operator.    03/18/2020, 7:03 AM

## 2020-03-19 ENCOUNTER — Inpatient Hospital Stay (HOSPITAL_COMMUNITY): Payer: Medicare Other

## 2020-03-19 LAB — GLUCOSE, CAPILLARY
Glucose-Capillary: 199 mg/dL — ABNORMAL HIGH (ref 70–99)
Glucose-Capillary: 206 mg/dL — ABNORMAL HIGH (ref 70–99)
Glucose-Capillary: 216 mg/dL — ABNORMAL HIGH (ref 70–99)
Glucose-Capillary: 248 mg/dL — ABNORMAL HIGH (ref 70–99)
Glucose-Capillary: 233 mg/dL — ABNORMAL HIGH (ref 70–99)
Glucose-Capillary: 189 mg/dL — ABNORMAL HIGH (ref 70–99)
Glucose-Capillary: 230 mg/dL — ABNORMAL HIGH (ref 70–99)

## 2020-03-19 LAB — CBC WITH DIFFERENTIAL/PLATELET
Abs Immature Granulocytes: 0.24 10*3/uL — ABNORMAL HIGH (ref 0.00–0.07)
Basophils Absolute: 0.1 10*3/uL (ref 0.0–0.1)
Basophils Relative: 1 %
Eosinophils Absolute: 0.3 10*3/uL (ref 0.0–0.5)
Eosinophils Relative: 2 %
HCT: 34.2 % — ABNORMAL LOW (ref 36.0–46.0)
Hemoglobin: 11.1 g/dL — ABNORMAL LOW (ref 12.0–15.0)
Immature Granulocytes: 2 %
Lymphocytes Relative: 8 %
Lymphs Abs: 1 10*3/uL (ref 0.7–4.0)
MCH: 28.5 pg (ref 26.0–34.0)
MCHC: 32.5 g/dL (ref 30.0–36.0)
MCV: 87.9 fL (ref 80.0–100.0)
Monocytes Absolute: 0.9 10*3/uL (ref 0.1–1.0)
Monocytes Relative: 7 %
Neutro Abs: 10.2 10*3/uL — ABNORMAL HIGH (ref 1.7–7.7)
Neutrophils Relative %: 80 %
Platelets: 312 10*3/uL (ref 150–400)
RBC: 3.89 MIL/uL (ref 3.87–5.11)
RDW: 14 % (ref 11.5–15.5)
WBC: 12.7 10*3/uL — ABNORMAL HIGH (ref 4.0–10.5)
nRBC: 0 % (ref 0.0–0.2)

## 2020-03-19 LAB — BASIC METABOLIC PANEL
Anion gap: 14 (ref 5–15)
BUN: 56 mg/dL — ABNORMAL HIGH (ref 8–23)
CO2: 22 mmol/L (ref 22–32)
Calcium: 9.8 mg/dL (ref 8.9–10.3)
Chloride: 108 mmol/L (ref 98–111)
Creatinine, Ser: 2.25 mg/dL — ABNORMAL HIGH (ref 0.44–1.00)
GFR calc Af Amer: 24 mL/min — ABNORMAL LOW (ref >60)
GFR calc non Af Amer: 21 mL/min — ABNORMAL LOW (ref >60)
Glucose, Bld: 225 mg/dL — ABNORMAL HIGH (ref 70–99)
Potassium: 4 mmol/L (ref 3.5–5.1)
Sodium: 144 mmol/L (ref 135–145)

## 2020-03-19 LAB — MAGNESIUM: Magnesium: 2.1 mg/dL (ref 1.7–2.4)

## 2020-03-19 MED ORDER — METOPROLOL TARTRATE 25 MG/10 ML ORAL SUSPENSION
75.0000 mg | Freq: Two times a day (BID) | ORAL | Status: DC
  Administered 2020-03-19 – 2020-03-20 (×2): 75 mg
  Filled 2020-03-19 (×3): qty 30

## 2020-03-19 MED ORDER — GLUCERNA 1.2 CAL PO LIQD
1000.0000 mL | ORAL | Status: DC
  Administered 2020-03-19 – 2020-03-24 (×6): 1000 mL
  Filled 2020-03-19 (×9): qty 1000

## 2020-03-19 MED ORDER — INSULIN DETEMIR 100 UNIT/ML ~~LOC~~ SOLN
5.0000 [IU] | Freq: Two times a day (BID) | SUBCUTANEOUS | Status: DC
  Administered 2020-03-19: 5 [IU] via SUBCUTANEOUS
  Filled 2020-03-19 (×3): qty 0.05

## 2020-03-19 MED ORDER — MIRTAZAPINE 15 MG PO TABS
7.5000 mg | ORAL_TABLET | Freq: Every day | ORAL | Status: DC
  Administered 2020-03-19 – 2020-04-04 (×16): 7.5 mg
  Filled 2020-03-19 (×16): qty 1

## 2020-03-19 NOTE — Progress Notes (Signed)
PROGRESS NOTE    Tasha [name]  JSE:[id number] DOB: Jul 29, 1946 DOA: 03/08/2020 PCP: Patient, No Pcp Per    Chief Complaint  Patient presents with   Emesis    Brief Narrative:   74 year old with a history of stroke, DM2, HTN, and MI who was discharged February 19, 2020 following a stroke work-up and return to the hospital 03/08/2020 with 2 days of intractable nausea and vomiting along with altered mental status.  She was found to be suffering with lactic acidosis and acute kidney injury.  Following admission the patient improved with supportive care.  Due to persisting right-sided weakness which was felt to be worse than previously documented an MRI was obtained  On 7/18 and suggested an acute left pontine infarct and acute small infarct on the posterior body of the right lateral ventricle.  As result she was transferred from [facility] to [facility] for neurologic evaluation.  Subjective:  One isolated 5beats of v tach on tele at 5:50 am., her blood pressure has been elevated 3.6liter urine output documented last 24hrs, She appears stronger and more alert today  Assessment & Plan:   Active Problems:   CVA (cerebral vascular accident) (HCC)   DM (diabetes mellitus), type 2 with peripheral vascular complications (HCC)   Essential (primary) hypertension   Lactic acidosis   Nausea and vomiting   AKI (acute kidney injury) (HCC)   Hyperkalemia   Sepsis (HCC)   Coffee ground emesis  Acute CVA  On 7/18 with history of recent CVA June 2021 EEG shows mild generalized slowing and triphasic waves but no definite seizure activity. Per neurology: patient was on clopidogrel 75 mg daily prior to admission, now on aspirin 81 mg daily and clopidogrel 75 mg daily. Continue DAPT x 3 weeks again then plavix alone She is also on crestor Stroke Team signed off on July 28  Acute kidney injury Most consistent with prerenal state, w/ probable component of ATN -  - continued slow recovery of renal  fxn - avoid nephrotoxins  -Nephrology signed off, nephrology will set up follow-up appointment with Dr. Malen Gauze at [facility] 2 weeks after discharge  Acute toxic metabolic encephalopathy Due to primarily to uremia and possible complicated by lacunar acute CVA -  - EEG unrevealing - TSH normal -B12 and folic acid not deficient -Appear improving  SIRS v/s Sepsis - POA Hypothermia, leukocytosis, and lactic acidosis present at admission -no clear source of infection identified -CXR at presentation with atelectasis only -CT abdomen pelvis without acute findings -UA not consistent with UTI and culture without growth -blood cultures no growth x2 -was treated with broad-spectrum empiric antibiotic for 7-day course - suspect this was primarily SIRS related to profound dehydration   Intractable nausea vomiting and coffee-ground emesis ( presenting symptom) Hemoccult negative -probable Mallory-Weiss tear due to recurrent vomiting caused by uremia - all anticoagulants/antiplatelet meds held initially but resumed Plavix 7/17 and aspirin 7/19  Dysphagia Diet per SLP recommendations Diet recommendations:  (full liquid) Liquids provided via: Cup;Straw Medication Administration: Via alternative means Supervision: Full supervision/cueing for compensatory strategies Compensations: Slow rate;Small sips/bites;Minimize environmental distractions Postural Changes and/or Swallow Maneuvers: Seated upright 90 degrees;Upright 30-60 min after meal  Cortrack dysfunction today, replace core track ,continue tube feeds  Hypomagnesemia, normalized status post IV mag  Noninsulin-dependent type II DM 2, controlled, A1c 6.9 Home medication Metformin held She has hyperglycemia in the hospital , she was started on Levemir 4 units twice a day and insulin sliding scale since July 17  tube feeds changed from Osmolite to Glucerna, blood glucose continue to be elevated, increase Levemir, continue sliding  scale  HTN Allowed permission hypertension initially, bp starts to trend up, will increase lopressor to 75 mg twice a day Start Norvasc  NSVT, keep mag>2, k>4, increase lopressor to 75 mg twice a day   Depression: Family desires treatment ,start low-dose Remeron,   FTT, will need snf placement once medically stable, she continue requiring feeding tube for nutrition   DVT prophylaxis: heparin injection 5,000 Units Start: 03/14/20 0915   Code Status: Full Family Communication: Daughter at bedside on 7/21, son over the phone on July 23 Disposition:   Status is: Inpatient   Dispo:  Patient From: Skilled Nursing Facility  Planned Disposition: Skilled Nursing Facility  Expected discharge date: 03/23/20  Medically stable for discharge: No, remains on tube feeds   Consultants:   Neurology, signed off on July 20  Nephrology  Procedures:   Core track placement for tube feeds  Antimicrobials:    Cefepime 7/12 > 7/13 Rocephin 7/14 > 7/18 Vancomycin 7/12      Objective: Vitals:   03/18/20 1943 03/18/20 2332 03/19/20 0340 03/19/20 0750  BP: (!) 163/72 (!) 169/68 (!) 167/67 (!) 180/77  Pulse: 88 73 78 87  Resp: 20 16 18 18   Temp: 99.6 F (37.6 C) 98.8 F (37.1 C) 99.4 F (37.4 C) 98.5 F (36.9 C)  TempSrc: Axillary Axillary Oral Oral  SpO2: 99% 96% 93% 95%  Weight:      Height:        Intake/Output Summary (Last 24 hours) at 03/19/2020 0755 Last data filed at 03/19/2020 03/21/2020 Gross per 24 hour  Intake 810.75 ml  Output 3650 ml  Net -2839.25 ml   Filed Weights   03/14/20 0500 03/15/20 0453 03/16/20 0158  Weight: 63.8 kg 62.4 kg 60.5 kg    Examination:  General exam: Chronically ill-appearing, weak,  follow command on the left side, she appears stronger and can talk in short sentences today , core track for tube feeds Respiratory system: Diminished at bases , no rales, no rhonchi, no wheezing. Respiratory effort normal. Cardiovascular system: S1 & S2  heard, RRR. No JVD, no murmur, No pedal edema. Gastrointestinal system: Abdomen is nondistended, soft and nontender. No organomegaly or masses felt. Normal bowel sounds heard. Central nervous system: Alert , talk a few words,  follow command on left diet with very weak grip, she has minimal movement right upper and right lower extremity Extremities: as above, right hand dependent edema, no edema in legs Skin: No rashes, lesions or ulcers Psychiatry: flat affect    Data Reviewed: I have personally reviewed following labs and imaging studies  CBC: Recent Labs  Lab 03/13/20 0251 03/14/20 0335 03/15/20 0450 03/17/20 0411 03/18/20 0519  WBC 12.4* 14.5* 12.3* 11.3* 10.7*  NEUTROABS 9.5* 10.7* 9.3*  --  8.0*  HGB 11.9* 11.9* 10.4* 10.7* 10.0*  HCT 35.7* 35.2* 31.5* 32.7* 31.4*  MCV 86.7 86.3 87.5 88.4 89.0  PLT 160 180 147* 203 208    Basic Metabolic Panel: Recent Labs  Lab 03/13/20 0251 03/13/20 0251 03/14/20 0335 03/15/20 0450 03/16/20 1155 03/17/20 0411 03/18/20 0519  NA 134*   < > 139 137 138 138 142  K 3.7   < > 3.7 3.5 3.6 3.9 4.0  CL 93*   < > 99 99 103 104 107  CO2 26   < > 26 27 25 23 24   GLUCOSE 311*   < >  180* 185* 198* 223* 214*  BUN 99*   < > 84* 75* 63* 65* 63*  CREATININE 6.18*   < > 5.49* 4.67*   4.53* 4.04* 3.81* 3.07*  CALCIUM 7.9*   < > 8.6* 8.7* 8.7* 9.0 9.2  MG 2.2  --  1.9  --   --  1.6* 1.5*  PHOS 3.7   3.7  --  3.0   2.8 3.9 4.4 4.5   4.6  --    < > = values in this interval not displayed.    GFR: Estimated Creatinine Clearance: 14.1 mL/min (A) (by C-G formula based on SCr of 3.07 mg/dL (H)).  Liver Function Tests: Recent Labs  Lab 03/13/20 0251 03/14/20 0335 03/15/20 0450 03/16/20 1155 03/17/20 0411  AST  --  23 39  --   --   ALT  --  25 42  --   --   ALKPHOS  --  60 51  --   --   BILITOT  --  0.3 0.2*  --   --   PROT  --  6.2* 5.8*  --   --   ALBUMIN 2.9* 2.8*   2.8* 2.7*   2.7* 2.4* 2.6*    CBG: Recent Labs  Lab 03/18/20 1522  03/18/20 1955 03/18/20 2357 03/19/20 0405 03/19/20 0751  GLUCAP 180* 236* 236* 199* 206*     Recent Results (from the past 240 hour(s))  MRSA PCR Screening     Status: None   Collection Time: 03/13/20 10:42 PM   Specimen: Nasopharyngeal  Result Value Ref Range Status   MRSA by PCR NEGATIVE NEGATIVE Final    Comment:        The GeneXpert MRSA Assay (FDA approved for NASAL specimens only), is one component of a comprehensive MRSA colonization surveillance program. It is not intended to diagnose MRSA infection nor to guide or monitor treatment for MRSA infections. Performed at [facility], [address] [place], [place] [address]          Radiology Studies: No results found.      Scheduled Meds:  amLODipine  10 mg Per Tube Daily   aspirin  81 mg Per Tube Daily   chlorhexidine  15 mL Mouth Rinse BID   clopidogrel  75 mg Per Tube Daily   feeding supplement (PROSource TF)  45 mL Per Tube Daily   free water  100 mL Per Tube Q6H   heparin injection (subcutaneous)  5,000 Units Subcutaneous Q8H   insulin aspart  0-9 Units Subcutaneous Q4H   insulin detemir  4 Units Subcutaneous BID   mouth rinse  15 mL Mouth Rinse BID   metoprolol tartrate  50 mg Per Tube BID   pantoprazole sodium  40 mg Per Tube Daily   Ensure Max Protein  11 oz Oral BID   rosuvastatin  5 mg Per Tube Daily   Continuous Infusions:  feeding supplement (GLUCERNA 1.2 CAL)       LOS: 11 days     Time spent: I have personally reviewed and interpreted on  03/19/2020 daily labs, tele strips, imagings as discussed above under date review session and assessment and plans.  I reviewed all nursing notes, pharmacy notes, consultant notes,  vitals, pertinent old records  I have discussed plan of care as described above with RN , patient and family on 03/19/2020  Voice Recognition /Dragon dictation system was used to create this note, attempts have been made to  correct errors.  Please contact the author with questions and/or clarifications.   Albertine GratesFang Minnie Legros, MD PhD FACP [facility]  Available via Epic secure chat 7am-7pm for nonurgent issues Please page for urgent issues To page the attending provider between 7A-7P or the covering provider during after hours 7P-7A, please log into the web site [email or web address] and access using universal [place] password for that web site. If you do not have the password, please call the hospital operator.    03/19/2020, 7:55 AM

## 2020-03-19 NOTE — Progress Notes (Signed)
Writer was unable to push medication or feeding through the cortrak tube this am, all effort to unclog was futile. MD notified and cortrak team was also notified per MD suggestion.  Cortrak team paged and dietician informed. Waiting for immediate intervention.

## 2020-03-19 NOTE — Progress Notes (Signed)
Received call from CCMD that patient had a 6 beat run of Lawyer notified patient's RN

## 2020-03-19 NOTE — Procedures (Signed)
Cortrak  Tube Type:  Cortrak - 43 inches Tube Location:  Right nare Initial Placement:  Stomach Secured by: Bridle Technique Used to Measure Tube Placement:  Documented cm marking at nare/ corner of mouth Cortrak Secured At:  63 cm    Cortrak Tube Team Note:  Consult received to place a Cortrak feeding tube.    X-ray is required, abdominal x-ray has been ordered by the Cortrak team. Please confirm tube placement before using the Cortrak tube.   If the tube becomes dislodged please keep the tube and contact the Cortrak team at [email or web address] (password [email or web address]) for replacement.  If after hours and replacement cannot be delayed, place a NG tube and confirm placement with an abdominal x-ray.    Betsey Holiday MS, RD, LDN Please refer to [facility] for RD and/or RD on-call/weekend/after hours pager

## 2020-03-19 NOTE — Progress Notes (Signed)
Nutrition Follow-up  DOCUMENTATION CODES:   Not applicable  INTERVENTION:  Continue Ensure Max po BID, each supplement provides 150 kcal and 30 grams of protein   Via NGT: -Transition to Glucerna 1.2 cal @ 56ml/hr ( ) -35ml Prosource TF daily - free water Q6H (per MD)  Tube feeding will provide 1336 kcals (meets 89% of minimum estimated needs), 75 grams protein, free water ( total free water with flushes)   NUTRITION DIAGNOSIS:   Inadequate oral intake related to lethargy/confusion as evidenced by other (comment) (per RN report).  Ongoing.  GOAL:   Patient will meet greater than or equal to 90% of their needs  Addressed via TF and oral nutrition supplements  MONITOR:   PO intake, Diet advancement, TF tolerance, Labs, Weight trends  REASON FOR ASSESSMENT:   Consult Enteral/tube feeding initiation and management  ASSESSMENT:   Pt with a history of stroke in June, HTN, MI and DM presented 7/12 with N/V and AMS. Pt admitted w/ uremia and possible infection. In hospital, R sided weakness not improving, MRI showed new acute infarcts.  7/15 NGT placed (gastric), TF initiated 7/16 NGT advanced to LOT 7/17 diet advanced to full liquids, TF reduced by MD  Pt noted to be tolerating TF. MD consulted RD to transition pt to Glucerna due to hyperglycemia.   PO Intake: 30% x 1 recorded meal since last RD assessment  Current TF: Osmolite 1.2 cal @ 71ml/hr, 86ml Prosource TF daily, free water Q6H  UOP: 3,670ml x24 hours I/O: +1,697.34ml since admit  Labs: Mg 1.5 (L), CBGs 199-236 (Diabetes Coordinator following) Medications: Novolog, Levemir, Protonix, Ensure Max BID  Diet Order:   Diet Order            Diet full liquid Room service appropriate? Yes; Fluid consistency: Thin  Diet effective now                 EDUCATION NEEDS:   No education needs have been identified at this time  Skin:  Skin Assessment: Skin Integrity Issues: Skin  Integrity Issues:: Other (Comment) Other: MASD perineum  Last BM:  7/21 type 5  Height:   Ht Readings from Last 1 Encounters:  03/08/20 5\' 4"  (1.626 m)    Weight:   Wt Readings from Last 1 Encounters:  03/16/20 60.5 kg    BMI:  Body mass index is 22.89 kg/m.  Estimated Nutritional Needs:   Kcal:  1500-1700 kcal  Protein:  75-85 grams  Fluid:  >/= 1.8 L/day    03/18/20, MS, RD, LDN RD pager number and weekend/on-call pager number located in Amion.

## 2020-03-20 LAB — MAGNESIUM: Magnesium: 1.9 mg/dL (ref 1.7–2.4)

## 2020-03-20 LAB — COMPREHENSIVE METABOLIC PANEL
ALT: 95 U/L — ABNORMAL HIGH (ref 0–44)
AST: 48 U/L — ABNORMAL HIGH (ref 15–41)
Albumin: 2.6 g/dL — ABNORMAL LOW (ref 3.5–5.0)
Alkaline Phosphatase: 57 U/L (ref 38–126)
Anion gap: 14 (ref 5–15)
BUN: 63 mg/dL — ABNORMAL HIGH (ref 8–23)
CO2: 23 mmol/L (ref 22–32)
Calcium: 9.7 mg/dL (ref 8.9–10.3)
Chloride: 109 mmol/L (ref 98–111)
Creatinine, Ser: 2.25 mg/dL — ABNORMAL HIGH (ref 0.44–1.00)
GFR calc Af Amer: 24 mL/min — ABNORMAL LOW (ref >60)
GFR calc non Af Amer: 21 mL/min — ABNORMAL LOW (ref >60)
Glucose, Bld: 231 mg/dL — ABNORMAL HIGH (ref 70–99)
Potassium: 3.8 mmol/L (ref 3.5–5.1)
Sodium: 146 mmol/L — ABNORMAL HIGH (ref 135–145)
Total Bilirubin: 0.6 mg/dL (ref 0.3–1.2)
Total Protein: 6.8 g/dL (ref 6.5–8.1)

## 2020-03-20 LAB — CBC WITH DIFFERENTIAL/PLATELET
Abs Immature Granulocytes: 0.17 10*3/uL — ABNORMAL HIGH (ref 0.00–0.07)
Basophils Absolute: 0.1 10*3/uL (ref 0.0–0.1)
Basophils Relative: 1 %
Eosinophils Absolute: 0.3 10*3/uL (ref 0.0–0.5)
Eosinophils Relative: 3 %
HCT: 31.5 % — ABNORMAL LOW (ref 36.0–46.0)
Hemoglobin: 10.4 g/dL — ABNORMAL LOW (ref 12.0–15.0)
Immature Granulocytes: 2 %
Lymphocytes Relative: 9 %
Lymphs Abs: 1 10*3/uL (ref 0.7–4.0)
MCH: 29.1 pg (ref 26.0–34.0)
MCHC: 33 g/dL (ref 30.0–36.0)
MCV: 88 fL (ref 80.0–100.0)
Monocytes Absolute: 1 10*3/uL (ref 0.1–1.0)
Monocytes Relative: 8 %
Neutro Abs: 9.1 10*3/uL — ABNORMAL HIGH (ref 1.7–7.7)
Neutrophils Relative %: 77 %
Platelets: 339 10*3/uL (ref 150–400)
RBC: 3.58 MIL/uL — ABNORMAL LOW (ref 3.87–5.11)
RDW: 13.9 % (ref 11.5–15.5)
WBC: 11.6 10*3/uL — ABNORMAL HIGH (ref 4.0–10.5)
nRBC: 0 % (ref 0.0–0.2)

## 2020-03-20 LAB — GLUCOSE, CAPILLARY
Glucose-Capillary: 195 mg/dL — ABNORMAL HIGH (ref 70–99)
Glucose-Capillary: 217 mg/dL — ABNORMAL HIGH (ref 70–99)
Glucose-Capillary: 280 mg/dL — ABNORMAL HIGH (ref 70–99)
Glucose-Capillary: 184 mg/dL — ABNORMAL HIGH (ref 70–99)
Glucose-Capillary: 184 mg/dL — ABNORMAL HIGH (ref 70–99)
Glucose-Capillary: 310 mg/dL — ABNORMAL HIGH (ref 70–99)

## 2020-03-20 MED ORDER — FREE WATER
200.0000 mL | Freq: Four times a day (QID) | Status: DC
  Administered 2020-03-20 – 2020-03-23 (×11): 200 mL

## 2020-03-20 MED ORDER — HYDRALAZINE HCL 25 MG PO TABS
25.0000 mg | ORAL_TABLET | Freq: Three times a day (TID) | ORAL | Status: DC
  Administered 2020-03-20 – 2020-03-21 (×2): 25 mg via ORAL
  Filled 2020-03-20 (×2): qty 1

## 2020-03-20 MED ORDER — METOPROLOL TARTRATE 25 MG/10 ML ORAL SUSPENSION
100.0000 mg | Freq: Two times a day (BID) | ORAL | Status: DC
  Administered 2020-03-20 – 2020-04-04 (×25): 100 mg
  Filled 2020-03-20 (×32): qty 40

## 2020-03-20 MED ORDER — INSULIN DETEMIR 100 UNIT/ML ~~LOC~~ SOLN
6.0000 [IU] | Freq: Two times a day (BID) | SUBCUTANEOUS | Status: DC
  Administered 2020-03-20 – 2020-03-21 (×3): 6 [IU] via SUBCUTANEOUS
  Filled 2020-03-20 (×4): qty 0.06

## 2020-03-20 NOTE — Progress Notes (Addendum)
PROGRESS NOTE    Tasha [name]  IDP:[id number] DOB: Dec 30, 1945 DOA: 03/08/2020 PCP: Patient, No Pcp Per    Chief Complaint  Patient presents with  . Emesis    Brief Narrative:   74 year old with a history of stroke, DM2, HTN, and MI who was discharged February 19, 2020 following a stroke work-up and return to the hospital 03/08/2020 with 2 days of intractable nausea and vomiting along with altered mental status.  She was found to be suffering with lactic acidosis and acute kidney injury.  Following admission the patient improved with supportive care.  Due to persisting right-sided weakness which was felt to be worse than previously documented an MRI was obtained  On 7/18 and suggested an acute left pontine infarct and acute small infarct on the posterior body of the right lateral ventricle.  As result she was transferred from [facility] to [facility] for neurologic evaluation.  Subjective:  her blood pressure remain elevated, sodium 146, BUN creatinine seem has plateaued 925 urine output documented last 24hrs, not sure if accurate  She is still very frail, but alert and interactive, appear the same compared to yesterday She denies pain, no hypoxia  Assessment & Plan:   Active Problems:   CVA (cerebral vascular accident) (HCC)   DM (diabetes mellitus), type 2 with peripheral vascular complications (HCC)   Essential (primary) hypertension   Lactic acidosis   Nausea and vomiting   AKI (acute kidney injury) (HCC)   Hyperkalemia   Sepsis (HCC)   Coffee ground emesis  Acute CVA  On 7/18 with history of recent CVA June 2021 EEG shows mild generalized slowing and triphasic waves but no definite seizure activity. Per neurology: patient was on clopidogrel 75 mg daily prior to admission, now on aspirin 81 mg daily and clopidogrel 75 mg daily. Continue DAPT x 3 weeks again then plavix alone She is also on crestor Stroke Team signed off on July 28  Acute kidney injury -Most consistent  with prerenal state, w/ probable component of ATN -  -  avoid nephrotoxins  -bun/cr on admission at 149/9.5, improved, appears plateaued at 60/2.25 -Nephrology signed off, nephrology will set up follow-up appointment with Dr. Malen Gauze at [facility] 2 weeks after discharge  Hypernatremia With BUN/creatinine  plateaued Suspect intravascular dehydration, increase free water flushes from 100 cc every 6 hours to 200 cc every 6 hours Repeat BMP in the morning  Acute toxic metabolic encephalopathy Due to primarily to uremia and possible complicated by lacunar acute CVA -  - EEG unrevealing - TSH normal -B12 and folic acid not deficient -Appear improving  SIRS v/s Sepsis - POA Hypothermia, leukocytosis, and lactic acidosis present at admission -no clear source of infection identified -CXR at presentation with atelectasis only -CT abdomen pelvis without acute findings -UA not consistent with UTI and culture without growth -blood cultures no growth x2 -was treated with broad-spectrum empiric antibiotic for 7-day course - suspect this was primarily SIRS related to profound dehydration   Intractable nausea vomiting and coffee-ground emesis ( presenting symptom) Hemoccult negative -probable Mallory-Weiss tear due to recurrent vomiting caused by uremia - all anticoagulants/antiplatelet meds held initially but resumed Plavix 7/17 and aspirin 7/19  Dysphagia Diet per SLP recommendations Diet recommendations:  (full liquid) Liquids provided via: Cup;Straw Medication Administration: Via alternative means Supervision: Full supervision/cueing for compensatory strategies Compensations: Slow rate;Small sips/bites;Minimize environmental distractions Postural Changes and/or Swallow Maneuvers: Seated upright 90 degrees;Upright 30-60 min after meal  Cortrack dysfunction on 7/23, replaced cor track ,  continue tube feeds  Hypomagnesemia, normalized status post IV mag, monitor  Noninsulin-dependent type  II DM 2, controlled, A1c 6.9 Home medication Metformin held She has hyperglycemia in the hospital ,  tube feeds changed from Osmolite to Glucerna,  blood glucose continue to be elevated, increase Levemir to 6units bid, continue sliding scale  HTN Allowed permission hypertension initially,   increase lopressor to 100 mg twice a day, start hydralazine 25mg  tid Started Norvasc on 7/23  NSVT, keep mag>2, k>4, increase lopressor to 100 mg twice a day   Depression: Family desires treatment ,start low-dose Remeron,   FTT, will need snf placement once medically stable, she continue requiring feeding tube for nutrition   DVT prophylaxis: heparin injection 5,000 Units Start: 03/14/20 0915   Code Status: Full Family Communication: Daughter at bedside on 7/21, son over the phone on July 23, son at bedside on 7/24 Disposition:   Status is: Inpatient   Dispo:  Patient From: Skilled Nursing Facility  Planned Disposition: Skilled Nursing Facility  Expected discharge date: 03/23/20  Medically stable for discharge: No, remains on tube feeds   Consultants:   Neurology, signed off on July 20  Nephrology  Procedures:   Core track placement for tube feeds  Antimicrobials:    Cefepime 7/12 > 7/13 Rocephin 7/14 > 7/18 Vancomycin 7/12      Objective: Vitals:   03/19/20 2011 03/19/20 2321 03/20/20 0311 03/20/20 0741  BP: (!) 173/74 (!) 173/63 (!) 174/76 (!) 187/77  Pulse: 81 70 76 82  Resp: 17 18 18 16   Temp: 98.6 F (37 C) 99.5 F (37.5 C) 98.6 F (37 C) 98.9 F (37.2 C)  TempSrc: Oral Oral Oral Oral  SpO2: 100% 97% 97% 99%  Weight:      Height:        Intake/Output Summary (Last 24 hours) at 03/20/2020 [id number] Last data filed at 03/20/2020 0315 Gross per 24 hour  Intake --  Output 1225 ml  Net -1225 ml   Filed Weights   03/14/20 0500 03/15/20 0453 03/16/20 0158  Weight: 63.8 kg 62.4 kg 60.5 kg    Examination:  General exam: Chronically ill-appearing, weak,   follow command on the left side, she appears stronger and can talk in short sentences today , core track for tube feeds Respiratory system: Diminished at bases , no rales, no rhonchi, no wheezing. Respiratory effort normal. Cardiovascular system: S1 & S2 heard, RRR. No JVD, no murmur, No pedal edema. Gastrointestinal system: Abdomen is nondistended, soft and nontender. No organomegaly or masses felt. Normal bowel sounds heard. Central nervous system: Alert , talk a few words,  follow command on left diet with very weak grip, she has minimal movement right upper and right lower extremity Extremities: as above, right hand dependent edema, no edema in legs Skin: No rashes, lesions or ulcers Psychiatry: flat affect    Data Reviewed: I have personally reviewed following labs and imaging studies  CBC: Recent Labs  Lab 03/14/20 0335 03/14/20 0335 03/15/20 0450 03/17/20 0411 03/18/20 0519 03/19/20 1240 03/20/20 0216  WBC 14.5*   < > 12.3* 11.3* 10.7* 12.7* 11.6*  NEUTROABS 10.7*  --  9.3*  --  8.0* 10.2* 9.1*  HGB 11.9*   < > 10.4* 10.7* 10.0* 11.1* 10.4*  HCT 35.2*   < > 31.5* 32.7* 31.4* 34.2* 31.5*  MCV 86.3   < > 87.5 88.4 89.0 87.9 88.0  PLT 180   < > 147* 203 208 312 339   < > =  values in this interval not displayed.    Basic Metabolic Panel: Recent Labs  Lab 03/14/20 0335 03/14/20 0335 03/15/20 0450 03/15/20 0450 03/16/20 1155 03/17/20 0411 03/18/20 0519 03/19/20 1240 03/20/20 0216  NA 139   < > 137   < > 138 138 142 144 146*  K 3.7   < > 3.5   < > 3.6 3.9 4.0 4.0 3.8  CL 99   < > 99   < > 103 104 107 108 109  CO2 26   < > 27   < > 25 23 24 22 23   GLUCOSE 180*   < > 185*   < > 198* 223* 214* 225* 231*  BUN 84*   < > 75*   < > 63* 65* 63* 56* 63*  CREATININE 5.49*   < > 4.67*  4.53*   < > 4.04* 3.81* 3.07* 2.25* 2.25*  CALCIUM 8.6*   < > 8.7*   < > 8.7* 9.0 9.2 9.8 9.7  MG 1.9  --   --   --   --  1.6* 1.5* 2.1 1.9  PHOS 3.0  2.8  --  3.9  --  4.4 4.5  4.6  --    --   --    < > = values in this interval not displayed.    GFR: Estimated Creatinine Clearance: 19.2 mL/min (A) (by C-G formula based on SCr of 2.25 mg/dL (H)).  Liver Function Tests: Recent Labs  Lab 03/14/20 0335 03/15/20 0450 03/16/20 1155 03/17/20 0411 03/20/20 0216  AST 23 39  --   --  48*  ALT 25 42  --   --  95*  ALKPHOS 60 51  --   --  57  BILITOT 0.3 0.2*  --   --  0.6  PROT 6.2* 5.8*  --   --  6.8  ALBUMIN 2.8*  2.8* 2.7*  2.7* 2.4* 2.6* 2.6*    CBG: Recent Labs  Lab 03/19/20 1546 03/19/20 2010 03/19/20 2318 03/20/20 0308 03/20/20 0743  GLUCAP 233* 189* 230* 195* 217*     Recent Results (from the past 240 hour(s))  MRSA PCR Screening     Status: None   Collection Time: 03/13/20 10:42 PM   Specimen: Nasopharyngeal  Result Value Ref Range Status   MRSA by PCR NEGATIVE NEGATIVE Final    Comment:        The GeneXpert MRSA Assay (FDA approved for NASAL specimens only), is one component of a comprehensive MRSA colonization surveillance program. It is not intended to diagnose MRSA infection nor to guide or monitor treatment for MRSA infections. Performed at [facility], [address], [place], [place]          Radiology Studies: DG Abd Portable 1V  Result Date: 03/19/2020 CLINICAL DATA:  Feeding tube placement EXAM: PORTABLE ABDOMEN - 1 VIEW COMPARISON:  03/12/2020 FINDINGS: Weighted enteric feeding tube tip terminates within the expected location of the distal stomach. Visualized bowel gas pattern is nonspecific. No gross free intraperitoneal air. Cement augmentation of L2. Healed posterior rib fractures involving the right tenth and eleventh ribs. IMPRESSION: Weighted enteric feeding tube tip terminates within the expected location of the distal stomach. Electronically Signed   By: 03/14/2020 D.O.   On: 03/19/2020 14:36        Scheduled Meds: . amLODipine  10 mg Per Tube Daily  . aspirin  81 mg Per Tube  Daily  . chlorhexidine  15  mL Mouth Rinse BID  . clopidogrel  75 mg Per Tube Daily  . feeding supplement (PROSource TF)  45 mL Per Tube Daily  . free water  100 mL Per Tube Q6H  . heparin injection (subcutaneous)  5,000 Units Subcutaneous Q8H  . insulin aspart  0-9 Units Subcutaneous Q4H  . insulin detemir  5 Units Subcutaneous BID  . mouth rinse  15 mL Mouth Rinse BID  . metoprolol tartrate  75 mg Per Tube BID  . mirtazapine  7.5 mg Per Tube QHS  . pantoprazole sodium  40 mg Per Tube Daily  . Ensure Max Protein  11 oz Oral BID  . rosuvastatin  5 mg Per Tube Daily   Continuous Infusions: . feeding supplement (GLUCERNA 1.2 CAL) 1,000 mL (03/19/20 1510)     LOS: 12 days     Time spent: I have personally reviewed and interpreted on  03/20/2020 daily labs, tele strips, imagings as discussed above under date review session and assessment and plans.  I reviewed all nursing notes, pharmacy notes, consultant notes,  vitals, pertinent old records  I have discussed plan of care as described above with RN , patient and family on 03/20/2020  Voice Recognition /Dragon dictation system was used to create this note, attempts have been made to correct errors. Please contact the author with questions and/or clarifications.   Albertine Grates, MD PhD FACP [facility]  Available via Epic secure chat 7am-7pm for nonurgent issues Please page for urgent issues To page the attending provider between 7A-7P or the covering provider during after hours 7P-7A, please log into the web site [email or web address] and access using universal [place] password for that web site. If you do not have the password, please call the hospital operator.    03/20/2020, 8:08 AM

## 2020-03-20 NOTE — Plan of Care (Signed)
  Problem: Education: Goal: Knowledge of General Education information will improve Description Including pain rating scale, medication(s)/side effects and non-pharmacologic comfort measures Outcome: Progressing   Problem: Health Behavior/Discharge Planning: Goal: Ability to manage health-related needs will improve Outcome: Progressing   

## 2020-03-21 LAB — COMPREHENSIVE METABOLIC PANEL
ALT: 91 U/L — ABNORMAL HIGH (ref 0–44)
AST: 29 U/L (ref 15–41)
Albumin: 2.6 g/dL — ABNORMAL LOW (ref 3.5–5.0)
Alkaline Phosphatase: 56 U/L (ref 38–126)
Anion gap: 13 (ref 5–15)
BUN: 77 mg/dL — ABNORMAL HIGH (ref 8–23)
CO2: 23 mmol/L (ref 22–32)
Calcium: 9.4 mg/dL (ref 8.9–10.3)
Chloride: 109 mmol/L (ref 98–111)
Creatinine, Ser: 2.14 mg/dL — ABNORMAL HIGH (ref 0.44–1.00)
GFR calc Af Amer: 26 mL/min — ABNORMAL LOW (ref >60)
GFR calc non Af Amer: 22 mL/min — ABNORMAL LOW (ref >60)
Glucose, Bld: 219 mg/dL — ABNORMAL HIGH (ref 70–99)
Potassium: 3.2 mmol/L — ABNORMAL LOW (ref 3.5–5.1)
Sodium: 145 mmol/L (ref 135–145)
Total Bilirubin: 0.5 mg/dL (ref 0.3–1.2)
Total Protein: 6.8 g/dL (ref 6.5–8.1)

## 2020-03-21 LAB — GLUCOSE, CAPILLARY
Glucose-Capillary: 238 mg/dL — ABNORMAL HIGH (ref 70–99)
Glucose-Capillary: 219 mg/dL — ABNORMAL HIGH (ref 70–99)
Glucose-Capillary: 278 mg/dL — ABNORMAL HIGH (ref 70–99)
Glucose-Capillary: 184 mg/dL — ABNORMAL HIGH (ref 70–99)
Glucose-Capillary: 148 mg/dL — ABNORMAL HIGH (ref 70–99)

## 2020-03-21 LAB — MAGNESIUM: Magnesium: 1.8 mg/dL (ref 1.7–2.4)

## 2020-03-21 MED ORDER — MAGNESIUM SULFATE 2 GM/50ML IV SOLN
2.0000 g | Freq: Once | INTRAVENOUS | Status: AC
  Administered 2020-03-21: 2 g via INTRAVENOUS
  Filled 2020-03-21: qty 50

## 2020-03-21 MED ORDER — HYDRALAZINE HCL 50 MG PO TABS
50.0000 mg | ORAL_TABLET | Freq: Three times a day (TID) | ORAL | Status: DC
  Administered 2020-03-21 – 2020-03-22 (×5): 50 mg via ORAL
  Filled 2020-03-21 (×6): qty 1

## 2020-03-21 MED ORDER — INSULIN DETEMIR 100 UNIT/ML ~~LOC~~ SOLN
7.0000 [IU] | Freq: Two times a day (BID) | SUBCUTANEOUS | Status: DC
  Administered 2020-03-21 – 2020-03-23 (×4): 7 [IU] via SUBCUTANEOUS
  Filled 2020-03-21 (×5): qty 0.07

## 2020-03-21 MED ORDER — POTASSIUM CHLORIDE 20 MEQ/15ML (10%) PO SOLN
40.0000 meq | Freq: Once | ORAL | Status: AC
  Administered 2020-03-21: 40 meq
  Filled 2020-03-21: qty 30

## 2020-03-21 NOTE — Progress Notes (Signed)
PROGRESS NOTE    Tasha [name]  ATF:[id number] DOB: [birth year] DOA: 03/08/2020 PCP: Patient, No Pcp Per    Chief Complaint  Patient presents with   Emesis    Brief Narrative:   74 year old with a history of stroke, DM2, HTN, and MI who was discharged February 19, 2020 following a stroke work-up and return to the hospital 03/08/2020 with 2 days of intractable nausea and vomiting along with altered mental status.  She was found to be suffering with lactic acidosis and acute kidney injury.  Following admission the patient improved with supportive care.  Due to persisting right-sided weakness which was felt to be worse than previously documented an MRI was obtained  On 7/18 and suggested an acute left pontine infarct and acute small infarct on the posterior body of the right lateral ventricle.  As result she was transferred from [facility] to [facility] for neurologic evaluation.  Subjective:  her blood pressure remains elevated, but improved,  sodium 145, BUN 77/cr 2.14 1000 urine output documented last 24hrs, urine is clear  She is still very frail, but alert and interactive, appear the same compared to yesterday She denies pain, no hypoxia  Assessment & Plan:   Active Problems:   CVA (cerebral vascular accident) (HCC)   DM (diabetes mellitus), type 2 with peripheral vascular complications (HCC)   Essential (primary) hypertension   Lactic acidosis   Nausea and vomiting   AKI (acute kidney injury) (HCC)   Hyperkalemia   Sepsis (HCC)   Coffee ground emesis  Acute CVA  On 7/18 with history of recent CVA June 2021 EEG shows mild generalized slowing and triphasic waves but no definite seizure activity. Per neurology: patient was on clopidogrel 75 mg daily prior to admission, now on aspirin 81 mg daily and clopidogrel 75 mg daily. Continue DAPT x 3 weeks again then plavix alone She is also on crestor Stroke Team signed off on July 28  Acute kidney injury -Most consistent with  prerenal state, w/ probable component of ATN -  -  avoid nephrotoxins  -bun/cr on admission at 149/9.5, improved, today BUN 77, creatinine 2.14 -Nephrology signed off, nephrology will set up follow-up appointment with Dr. Malen Gauze at [facility] 2 weeks after discharge  Hypernatremia Suspect intravascular dehydration, Sodium and cr improved after  increasing free water flushes from 100 cc every 6 hours to 200 cc every 6 hours,  Repeat BMP in the morning  Acute toxic metabolic encephalopathy Due to primarily to uremia and possible complicated by lacunar acute CVA -  - EEG unrevealing - -TSH normal -B12 and folic acid not deficient -Appear improving  SIRS v/s Sepsis - POA Hypothermia, leukocytosis, and lactic acidosis present at admission -no clear source of infection identified -CXR at presentation with atelectasis only -CT abdomen pelvis without acute findings -UA not consistent with UTI and culture without growth -blood cultures no growth x2 -was treated with broad-spectrum empiric antibiotic for 7-day course - suspect this was primarily SIRS related to profound dehydration   Intractable nausea vomiting and coffee-ground emesis ( presenting symptom) Hemoccult negative -probable Mallory-Weiss tear due to recurrent vomiting caused by uremia - all anticoagulants/antiplatelet meds held initially but resumed Plavix 7/17 and aspirin 7/19  Dysphagia Diet per SLP recommendations Diet recommendations:  (full liquid) Liquids provided via: Cup;Straw Medication Administration: Via alternative means Supervision: Full supervision/cueing for compensatory strategies Compensations: Slow rate;Small sips/bites;Minimize environmental distractions Postural Changes and/or Swallow Maneuvers: Seated upright 90 degrees;Upright 30-60 min after meal  Cortrack dysfunction on  7/23, replaced cor track ,continue tube feeds  Hypokalemia: Replace K, recheck in the morning  Hypomagnesemia, give IV mag  today, monitor  Noninsulin-dependent type II DM 2, controlled, A1c 6.9 Home medication Metformin held She has hyperglycemia in the hospital ,  tube feeds changed from Osmolite to Glucerna,  blood glucose improved , but remain elevated, increase Levemir to 7units bid, continue sliding scale  HTN lopressor to 100 mg twice a day, Norvasc 10 mg daily BP remains elevated ,increase hydralazine to 50 mg 3 times daily   NSVT, keep mag>2, k>4, increase lopressor to 100 mg twice a day   Depression: Family desires treatment ,start low-dose Remeron,   FTT, will need snf placement once medically stable, she continue requiring feeding tube for nutrition   DVT prophylaxis: heparin injection 5,000 Units Start: 03/14/20 0915   Code Status: Full Family Communication: Daughter at bedside on 7/21, son over the phone on July 23, son at bedside on 7/24 Disposition:   Status is: Inpatient   Dispo:  Patient From: Skilled Nursing Facility  Planned Disposition: Skilled Nursing Facility  Expected discharge date: 03/23/20  Medically stable for discharge: No, remains on tube feeds   Consultants:   Neurology, signed off on July 20  Nephrology  Procedures:   Core track placement for tube feeds  Antimicrobials:    Cefepime 7/12 > 7/13 Rocephin 7/14 > 7/18 Vancomycin 7/12      Objective: Vitals:   03/20/20 1920 03/20/20 2310 03/21/20 0303 03/21/20 0804  BP: (!) 155/62 (!) 144/67 (!) 178/73 (!) 173/70  Pulse: 72 66 72 76  Resp: 18 15 18 18   Temp: 99 F (37.2 C) 98.6 F (37 C) 99.1 F (37.3 C) 99 F (37.2 C)  TempSrc: Oral Oral Oral Oral  SpO2: 97% 98% 98% 97%  Weight:      Height:        Intake/Output Summary (Last 24 hours) at 03/21/2020 1018 Last data filed at 03/20/2020 1800 Gross per 24 hour  Intake 1307.5 ml  Output 1000 ml  Net 307.5 ml   Filed Weights   03/14/20 0500 03/15/20 0453 03/16/20 0158  Weight: 63.8 kg 62.4 kg 60.5 kg    Examination:  General  exam: Chronically ill-appearing, weak,  follow command on the left side,  core track for tube feeds Respiratory system: Diminished at bases , no rales, no rhonchi, no wheezing. Respiratory effort normal. Cardiovascular system: S1 & S2 heard, RRR. No JVD, no murmur, No pedal edema. Gastrointestinal system: Abdomen is nondistended, soft and nontender. No organomegaly or masses felt. Normal bowel sounds heard. Central nervous system: Alert , talk a few words,  follow command on left diet with very weak grip, she has minimal movement right upper and right lower extremity Extremities: as above, right hand dependent edema has improved, no edema in legs Skin: No rashes, lesions or ulcers Psychiatry: flat affect    Data Reviewed: I have personally reviewed following labs and imaging studies  CBC: Recent Labs  Lab 03/15/20 0450 03/17/20 0411 03/18/20 0519 03/19/20 1240 03/20/20 0216  WBC 12.3* 11.3* 10.7* 12.7* 11.6*  NEUTROABS 9.3*  --  8.0* 10.2* 9.1*  HGB 10.4* 10.7* 10.0* 11.1* 10.4*  HCT 31.5* 32.7* 31.4* 34.2* 31.5*  MCV 87.5 88.4 89.0 87.9 88.0  PLT 147* 203 208 312 339    Basic Metabolic Panel: Recent Labs  Lab 03/15/20 0450 03/15/20 0450 03/16/20 1155 03/16/20 1155 03/17/20 0411 03/18/20 0519 03/19/20 1240 03/20/20 0216 03/21/20 0415  NA 137   < >  138   < > 138 142 144 146* 145  K 3.5   < > 3.6   < > 3.9 4.0 4.0 3.8 3.2*  CL 99   < > 103   < > 104 107 108 109 109  CO2 27   < > 25   < > 23 24 22 23 23   GLUCOSE 185*   < > 198*   < > 223* 214* 225* 231* 219*  BUN 75*   < > 63*   < > 65* 63* 56* 63* 77*  CREATININE 4.67*   4.53*   < > 4.04*   < > 3.81* 3.07* 2.25* 2.25* 2.14*  CALCIUM 8.7*   < > 8.7*   < > 9.0 9.2 9.8 9.7 9.4  MG  --   --   --   --  1.6* 1.5* 2.1 1.9 1.8  PHOS 3.9  --  4.4  --  4.5   4.6  --   --   --   --    < > = values in this interval not displayed.    GFR: Estimated Creatinine Clearance: 20.2 mL/min (A) (by C-G formula based on SCr of 2.14  mg/dL (H)).  Liver Function Tests: Recent Labs  Lab 03/15/20 0450 03/16/20 1155 03/17/20 0411 03/20/20 0216 03/21/20 0415  AST 39  --   --  48* 29  ALT 42  --   --  95* 91*  ALKPHOS 51  --   --  57 56  BILITOT 0.2*  --   --  0.6 0.5  PROT 5.8*  --   --  6.8 6.8  ALBUMIN 2.7*   2.7* 2.4* 2.6* 2.6* 2.6*    CBG: Recent Labs  Lab 03/20/20 1635 03/20/20 1917 03/20/20 2309 03/21/20 0301 03/21/20 0802  GLUCAP 184* 184* 310* 238* 219*     Recent Results (from the past 240 hour(s))  MRSA PCR Screening     Status: None   Collection Time: 03/13/20 10:42 PM   Specimen: Nasopharyngeal  Result Value Ref Range Status   MRSA by PCR NEGATIVE NEGATIVE Final    Comment:        The GeneXpert MRSA Assay (FDA approved for NASAL specimens only), is one component of a comprehensive MRSA colonization surveillance program. It is not intended to diagnose MRSA infection nor to guide or monitor treatment for MRSA infections. Performed at [facility], [address] [place], [place] [address]          Radiology Studies: DG Abd Portable 1V  Result Date: 03/19/2020 CLINICAL DATA:  Feeding tube placement EXAM: PORTABLE ABDOMEN - 1 VIEW COMPARISON:  03/12/2020 FINDINGS: Weighted enteric feeding tube tip terminates within the expected location of the distal stomach. Visualized bowel gas pattern is nonspecific. No gross free intraperitoneal air. Cement augmentation of L2. Healed posterior rib fractures involving the right tenth and eleventh ribs. IMPRESSION: Weighted enteric feeding tube tip terminates within the expected location of the distal stomach. Electronically Signed   By: Duanne GuessNicholas  Plundo D.O.   On: 03/19/2020 14:36        Scheduled Meds:  amLODipine  10 mg Per Tube Daily   aspirin  81 mg Per Tube Daily   chlorhexidine  15 mL Mouth Rinse BID   clopidogrel  75 mg Per Tube Daily   feeding supplement (PROSource TF)  45 mL Per Tube Daily   free water   200 mL Per Tube Q6H   heparin injection (subcutaneous)  5,000 Units Subcutaneous Q8H   hydrALAZINE  25 mg Oral Q8H   insulin aspart  0-9 Units Subcutaneous Q4H   insulin detemir  6 Units Subcutaneous BID   mouth rinse  15 mL Mouth Rinse BID   metoprolol tartrate  100 mg Per Tube BID   mirtazapine  7.5 mg Per Tube QHS   pantoprazole sodium  40 mg Per Tube Daily   potassium chloride  40 mEq Per Tube Once   Ensure Max Protein  11 oz Oral BID   rosuvastatin  5 mg Per Tube Daily   Continuous Infusions:  feeding supplement (GLUCERNA 1.2 CAL) 1,000 mL (03/20/20 1635)   magnesium sulfate bolus IVPB       LOS: 13 days     Time spent: I have personally reviewed and interpreted on  03/21/2020 daily labs, tele strips, imagings as discussed above under date review session and assessment and plans.  I reviewed all nursing notes, pharmacy notes, consultant notes,  vitals, pertinent old records  I have discussed plan of care as described above with RN , patient and family on 03/21/2020  Voice Recognition /Dragon dictation system was used to create this note, attempts have been made to correct errors. Please contact the author with questions and/or clarifications.   Albertine Grates, MD PhD FACP [facility]  Available via Epic secure chat 7am-7pm for nonurgent issues Please page for urgent issues To page the attending provider between 7A-7P or the covering provider during after hours 7P-7A, please log into the web site [email or web address] and access using universal [place] password for that web site. If you do not have the password, please call the hospital operator.    03/21/2020, 10:18 AM

## 2020-03-22 LAB — CBC WITH DIFFERENTIAL/PLATELET
Abs Immature Granulocytes: 0.19 10*3/uL — ABNORMAL HIGH (ref 0.00–0.07)
Basophils Absolute: 0.1 10*3/uL (ref 0.0–0.1)
Basophils Relative: 1 %
Eosinophils Absolute: 0.8 10*3/uL — ABNORMAL HIGH (ref 0.0–0.5)
Eosinophils Relative: 5 %
HCT: 33.6 % — ABNORMAL LOW (ref 36.0–46.0)
Hemoglobin: 10.9 g/dL — ABNORMAL LOW (ref 12.0–15.0)
Immature Granulocytes: 1 %
Lymphocytes Relative: 10 %
Lymphs Abs: 1.4 10*3/uL (ref 0.7–4.0)
MCH: 28.7 pg (ref 26.0–34.0)
MCHC: 32.4 g/dL (ref 30.0–36.0)
MCV: 88.4 fL (ref 80.0–100.0)
Monocytes Absolute: 0.8 10*3/uL (ref 0.1–1.0)
Monocytes Relative: 6 %
Neutro Abs: 10.9 10*3/uL — ABNORMAL HIGH (ref 1.7–7.7)
Neutrophils Relative %: 77 %
Platelets: 433 10*3/uL — ABNORMAL HIGH (ref 150–400)
RBC: 3.8 MIL/uL — ABNORMAL LOW (ref 3.87–5.11)
RDW: 14.8 % (ref 11.5–15.5)
WBC: 14.2 10*3/uL — ABNORMAL HIGH (ref 4.0–10.5)
nRBC: 0 % (ref 0.0–0.2)

## 2020-03-22 LAB — COMPREHENSIVE METABOLIC PANEL
ALT: 121 U/L — ABNORMAL HIGH (ref 0–44)
AST: 60 U/L — ABNORMAL HIGH (ref 15–41)
Albumin: 2.6 g/dL — ABNORMAL LOW (ref 3.5–5.0)
Alkaline Phosphatase: 54 U/L (ref 38–126)
Anion gap: 14 (ref 5–15)
BUN: 92 mg/dL — ABNORMAL HIGH (ref 8–23)
CO2: 22 mmol/L (ref 22–32)
Calcium: 9.5 mg/dL (ref 8.9–10.3)
Chloride: 110 mmol/L (ref 98–111)
Creatinine, Ser: 2.27 mg/dL — ABNORMAL HIGH (ref 0.44–1.00)
GFR calc Af Amer: 24 mL/min — ABNORMAL LOW (ref >60)
GFR calc non Af Amer: 21 mL/min — ABNORMAL LOW (ref >60)
Glucose, Bld: 293 mg/dL — ABNORMAL HIGH (ref 70–99)
Potassium: 3.9 mmol/L (ref 3.5–5.1)
Sodium: 146 mmol/L — ABNORMAL HIGH (ref 135–145)
Total Bilirubin: 0.6 mg/dL (ref 0.3–1.2)
Total Protein: 6.8 g/dL (ref 6.5–8.1)

## 2020-03-22 LAB — MAGNESIUM: Magnesium: 2.6 mg/dL — ABNORMAL HIGH (ref 1.7–2.4)

## 2020-03-22 LAB — GLUCOSE, CAPILLARY
Glucose-Capillary: 205 mg/dL — ABNORMAL HIGH (ref 70–99)
Glucose-Capillary: 245 mg/dL — ABNORMAL HIGH (ref 70–99)
Glucose-Capillary: 247 mg/dL — ABNORMAL HIGH (ref 70–99)
Glucose-Capillary: 289 mg/dL — ABNORMAL HIGH (ref 70–99)
Glucose-Capillary: 302 mg/dL — ABNORMAL HIGH (ref 70–99)
Glucose-Capillary: 309 mg/dL — ABNORMAL HIGH (ref 70–99)
Glucose-Capillary: 309 mg/dL — ABNORMAL HIGH (ref 70–99)

## 2020-03-22 LAB — AMMONIA: Ammonia: 18 umol/L (ref 9–35)

## 2020-03-22 MED ORDER — INSULIN ASPART 100 UNIT/ML ~~LOC~~ SOLN
0.0000 [IU] | Freq: Three times a day (TID) | SUBCUTANEOUS | Status: DC
  Administered 2020-03-22: 11 [IU] via SUBCUTANEOUS
  Administered 2020-03-23: 8 [IU] via SUBCUTANEOUS
  Administered 2020-03-23: 5 [IU] via SUBCUTANEOUS
  Administered 2020-03-23: 3 [IU] via SUBCUTANEOUS
  Administered 2020-03-24: 11 [IU] via SUBCUTANEOUS
  Administered 2020-03-24: 2 [IU] via SUBCUTANEOUS
  Administered 2020-03-24: 3 [IU] via SUBCUTANEOUS
  Administered 2020-03-25: 8 [IU] via SUBCUTANEOUS
  Administered 2020-03-25: 5 [IU] via SUBCUTANEOUS
  Administered 2020-03-26 (×2): 3 [IU] via SUBCUTANEOUS
  Administered 2020-03-26: 2 [IU] via SUBCUTANEOUS
  Administered 2020-03-27 (×2): 5 [IU] via SUBCUTANEOUS
  Administered 2020-03-28: 3 [IU] via SUBCUTANEOUS
  Administered 2020-03-28 (×2): 2 [IU] via SUBCUTANEOUS
  Administered 2020-03-29: 3 [IU] via SUBCUTANEOUS
  Administered 2020-03-29: 2 [IU] via SUBCUTANEOUS
  Administered 2020-03-29: 5 [IU] via SUBCUTANEOUS
  Administered 2020-03-30: 3 [IU] via SUBCUTANEOUS
  Administered 2020-03-30 – 2020-04-02 (×5): 2 [IU] via SUBCUTANEOUS
  Administered 2020-04-02 – 2020-04-03 (×3): 3 [IU] via SUBCUTANEOUS
  Administered 2020-04-03: 2 [IU] via SUBCUTANEOUS
  Administered 2020-04-04: 3 [IU] via SUBCUTANEOUS
  Administered 2020-04-04: 5 [IU] via SUBCUTANEOUS
  Administered 2020-04-05: 2 [IU] via SUBCUTANEOUS
  Administered 2020-04-05: 3 [IU] via SUBCUTANEOUS
  Administered 2020-04-06 (×2): 2 [IU] via SUBCUTANEOUS

## 2020-03-22 MED ORDER — INSULIN ASPART 100 UNIT/ML ~~LOC~~ SOLN
3.0000 [IU] | SUBCUTANEOUS | Status: DC
  Administered 2020-03-22 – 2020-03-23 (×6): 3 [IU] via SUBCUTANEOUS

## 2020-03-22 NOTE — Progress Notes (Signed)
Inpatient Diabetes Program Recommendations  AACE/ADA: New Consensus Statement on Inpatient Glycemic Control (2015)  Target Ranges:  Prepandial:   less than 140 mg/dL      Peak postprandial:   less than 180 mg/dL (1-2 hours)      Critically ill patients:  140 - 180 mg/dL   Lab Results  Component Value Date   GLUCAP 309 (H) 03/22/2020   HGBA1C 6.9 (H) 02/15/2020    Review of Glycemic Control Results for Tasha [name], Tasha [name] (MRN [id number]) as of 03/22/2020 14:18  Ref. Range 03/21/2020 20:07 03/22/2020 00:01 03/22/2020 03:44 03/22/2020 08:16 03/22/2020 11:34  Glucose-Capillary Latest Ref Range: 70 - 99 mg/dL 932 (H) 671 (H) 245 (H) 245 (H) 309 (H)   Diabetes history:  DM2 Outpatient Diabetes medications:  Metformin 1000 mg daily  Current orders for Inpatient glycemic control:  Levemir 7 units bid  Novolog 0-9 units q4H Glucerna 1.2 cal @ 45 ml/hr  Inpatient Diabetes Program Recommendations:     Novolog 0-15 units Q4H Novolog 3 units q4h tube feed coverage.  Hold if feeds stopped.   Will continue to follow while inpatient.  Thank you, Dulce Sellar, RN, BSN Diabetes Coordinator Inpatient Diabetes Program [phone] (team pager from 8a-5p)

## 2020-03-22 NOTE — Progress Notes (Signed)
  Speech Language Pathology Treatment: Dysphagia  Patient Details Name: Tasha [name] MRN: [id number] DOB: 11/05/1945 Today's Date: 03/22/2020 Time: 1100-1120 SLP Time Calculation (min) (ACUTE ONLY): 20 min  Assessment / Plan / Recommendation Clinical Impression  Pt on ST's caseload and received word she was coughing with thin liquids. Oral care provided revealing lingual candidias and mild dried secretions on tongue and hard palate. She responded with one head gesture and one verbalization "yes" otherwise no response despite allowing extra time. Adequate eye contact. Immediate cough after larger cup sip thin with decreased labial seal. Labial residue post puree- no s/s aspiration with puree but given overall weakness decreased endurance and respiratory support and location of stroke (pons) pt is at increased risk. She has a Cortrak and a thin full liquid diet ordered. MD in agreement with discontinuing po. She is not quite appropriate today for MBS but will recommend when able to assist in determining prognosis for swallow recovery. Continue frequent oral care.    HPI HPI: Tasha [name] is a 74 y.o. female with history of stroke in June, HTN, MI and DM presenting 7/12 with nausea and vomiting, altered mental status, admitted w/ uremia and possible infection. In hospital, R sided weakness not improving, MRI showed acute left pontine infarction. Acute small infarct along the posterior body of the right lateral ventricle. Seen for swallowing during admission 02/17/20 >nectar thick then upgraded to thin/full liquids.       SLP Plan  Continue with current plan of care       Recommendations  Diet recommendations: NPO Medication Administration: Via alternative means                Oral Care Recommendations: Oral care QID Follow up Recommendations: Skilled Nursing facility;24 hour supervision/assistance SLP Visit Diagnosis: Dysphagia, unspecified (R13.10) Plan: Continue with current plan of  care       GO                Royce Macadamia 03/22/2020, 12:36 PM  Breck Coons Lonell Face.Ed Nurse, children's [phone] Office [phone]

## 2020-03-22 NOTE — Progress Notes (Signed)
PROGRESS NOTE    Tasha [name]  ZOX:[id number] DOB: 05/24/1946 DOA: 03/08/2020 PCP: Patient, No Pcp Per   Brief Narrative: Patient is a 74 year old with a history of stroke, DM2, HTN, and MI who was discharged June24,2021 following a stroke work-up and return to the hospital 03/08/2020 with 2 days of intractable nausea and vomiting along with altered mental status. On presentation,She was found to be have  lactic acidosis and acute kidney injury. Following admission the patient improved with supportive care. Due to persisting right-sided weakness which was felt to be worse than previously documented, an MRI was obtained  on 7/18 and showed  an acute left pontine infarct and acute small infarct on the posterior body of the right lateral ventricle. Neurology was following, now signed off.  Stroke work-up completed.  She is still on tube feeding because of severe dysphagia.  Plan is to discharge her to LTAC versus skilled nursing facility.  Assessment & Plan:   Active Problems:   CVA (cerebral vascular accident) (HCC)   DM (diabetes mellitus), type 2 with peripheral vascular complications (HCC)   Essential (primary) hypertension   Lactic acidosis   Nausea and vomiting   AKI (acute kidney injury) (HCC)   Hyperkalemia   Sepsis (HCC)   Coffee ground emesis  Acute CVA  On 7/18 with history of recent CVA June 2021 MRI as above Patient was on clopidogrel 75 mg dailyprior to admission, now on aspirin 81 mg daily and clopidogrel 75 mg daily.Continue DAPT x 3 weeks again then plavix alone She is also on crestor  Acute kidney injury -Most consistent with prerenal state, w/ probable component of ATN- -  avoid nephrotoxins -bun/cr on admission at 149/9.5, improving  -Nephrology signed off, nephrology will set up follow-up appointment with Dr. Malen GauzeFoster at [facility] 2 weeks after discharge  Hypernatremia Suspect intravascular dehydration, Sodium and cr improved after  increasing free  water flushes from 100 cc every 6 hours to 200 cc every 6 hours,  Monitor BMP  Acute toxic metabolic encephalopathy Due  lacunaracute CVA,on presentation,uremia was suspected to have contributed to AMS EEGunrevealing TSH normal-B12 and folic acid not deficient  SIRS v/s Sepsis- POA Hypothermia, leukocytosis, and lactic acidosis present at admission-no clear source of infection identified-CXR at presentation with atelectasis only-CT abdomen pelvis without acute findings-UA not consistent with UTI and culture without growth-blood cultures no growth x2-was treated with broad-spectrum empiric antibiotic for 7-day course  Intractable nausea vomiting and coffee-ground emesis Hemoccult negative-probable Mallory-Weiss teardue to recurrent vomiting caused by uremia-all anticoagulants/antiplatelet meds held initially but resumed Plavix 7/17 and aspirin 7/19  Dysphagia NPO as per  SLP recommendations On tube feeding  Noninsulin-dependent type II DM 2, controlled, A1c 6.9 Home medication Metformin held She has hyperglycemia in the hospital ,  tube feeds changed from Osmolite to Glucerna,  blood glucose improved , but remain elevated, insulin adjusted  HTN lopressor to 100 mg twice a day, Norvasc 10 mg daily BP stable  NSVT, keep mag>2, k>4,   Depression: Family desires treatment ,start low-dose Remeron,   Disposition: Continues to require defeating for feeding propose, unable to swallow.  In current circumstances, needs LTAC.  Social worker following  Nutrition Problem: Inadequate oral intake Etiology: lethargy/confusion      DVT prophylaxis:Heparin [place] Code Status: Full Family Communication: None present at bedside Status is: Inpatient  Remains inpatient appropriate because:Unsafe d/c plan   Dispo:  Patient From: Skilled Nursing Facility  Planned Disposition: Skilled Nursing Facility  Vs LTACH  Expected  discharge date: No sure  Medically stable for  discharge: No     Consultants: Neurology  Procedures: Tube feeding placement Antimicrobials:  Anti-infectives (From admission, onward)   Start     Dose/Rate Route Frequency Ordered Stop   03/10/20 1200  cefTRIAXone (ROCEPHIN) 2 g in sodium chloride 0.9 % 100 mL IVPB        2 g 200 mL/hr over 30 Minutes Intravenous Every 24 hours 03/10/20 0924 03/14/20 1333   03/09/20 1000  ceFEPIme (MAXIPIME) 1 g in sodium chloride 0.9 % 100 mL IVPB  Status:  Discontinued        1 g 200 mL/hr over 30 Minutes Intravenous Every 24 hours 03/08/20 1219 03/10/20 0924   03/08/20 1219  vancomycin variable dose per unstable renal function (pharmacist dosing)  Status:  Discontinued         Does not apply See admin instructions 03/08/20 1219 03/10/20 0906   03/08/20 1100  vancomycin (VANCOCIN) IVPB 1000 mg/200 mL premix        1,000 mg 200 mL/hr over 60 Minutes Intravenous  Once 03/08/20 1046 03/08/20 1406   03/08/20 1100  ceFEPIme (MAXIPIME) 2 g in sodium chloride 0.9 % 100 mL IVPB        2 g 200 mL/hr over 30 Minutes Intravenous  Once 03/08/20 1046 03/08/20 1211      Subjective: Patient seen and examined at the bedside this afternoon.  Hemodynamically stable.  Continues to remain dysphasic.  On tube feeding.  Very weak, lying on the bed.  Opens eyes on calling her name and tries to speak.  Objective: Vitals:   03/22/20 0000 03/22/20 0347 03/22/20 0817 03/22/20 1135  BP: (!) 147/53 (!) 152/69 (!) 164/69 (!) 147/64  Pulse: 71 78 96 101  Resp: 18 19 16 16   Temp: 98.5 F (36.9 C) 98.2 F (36.8 C) 98.1 F (36.7 C) 98 F (36.7 C)  TempSrc: Oral Oral Oral Oral  SpO2: 95% 96% 100% 99%  Weight:      Height:        Intake/Output Summary (Last 24 hours) at 03/22/2020 1522 Last data filed at 03/22/2020 1300 Gross per 24 hour  Intake --  Output 750 ml  Net -750 ml   Filed Weights   03/14/20 0500 03/15/20 0453 03/16/20 0158  Weight: 63.8 kg 62.4 kg 60.5 kg    Examination:  General exam:  Extremely deconditioned, debilitated HEENT:Tube feeding Respiratory system:  no wheezes or crackles  Cardiovascular system: S1 & S2 heard, RRR. No JVD, murmurs, rubs, gallops or clicks. No pedal edema. Gastrointestinal system: Abdomen is nondistended, soft and nontender. No organomegaly or masses felt. Normal bowel sounds heard. Central nervous system: Awake but not oriented, generalized weakness, weakness more pronounced on the right upper and lower extremities, weak grip Extremities: No edema, no clubbing ,no cyanosis Skin: No rashes, lesions or ulcers,no icterus ,no pallor  Data Reviewed: I have personally reviewed following labs and imaging studies  CBC: Recent Labs  Lab 03/17/20 0411 03/18/20 0519 03/19/20 1240 03/20/20 0216 03/22/20 0306  WBC 11.3* 10.7* 12.7* 11.6* 14.2*  NEUTROABS  --  8.0* 10.2* 9.1* 10.9*  HGB 10.7* 10.0* 11.1* 10.4* 10.9*  HCT 32.7* 31.4* 34.2* 31.5* 33.6*  MCV 88.4 89.0 87.9 88.0 88.4  PLT 203 208 312 339 433*   Basic Metabolic Panel: Recent Labs  Lab 03/16/20 1155 03/16/20 1155 03/17/20 0411 03/17/20 0411 03/18/20 0519 03/19/20 1240 03/20/20 0216 03/21/20 0415 03/22/20 0306  NA 138   < >  138   < > 142 144 146* 145 146*  K 3.6   < > 3.9   < > 4.0 4.0 3.8 3.2* 3.9  CL 103   < > 104   < > 107 108 109 109 110  CO2 25   < > 23   < > 24 22 23 23 22   GLUCOSE 198*   < > 223*   < > 214* 225* 231* 219* 293*  BUN 63*   < > 65*   < > 63* 56* 63* 77* 92*  CREATININE 4.04*   < > 3.81*   < > 3.07* 2.25* 2.25* 2.14* 2.27*  CALCIUM 8.7*   < > 9.0   < > 9.2 9.8 9.7 9.4 9.5  MG  --   --  1.6*   < > 1.5* 2.1 1.9 1.8 2.6*  PHOS 4.4  --  4.5  4.6  --   --   --   --   --   --    < > = values in this interval not displayed.   GFR: Estimated Creatinine Clearance: 19.1 mL/min (A) (by C-G formula based on SCr of 2.27 mg/dL (H)). Liver Function Tests: Recent Labs  Lab 03/16/20 1155 03/17/20 0411 03/20/20 0216 03/21/20 0415 03/22/20 0306  AST  --   --   48* 29 60*  ALT  --   --  95* 91* 121*  ALKPHOS  --   --  57 56 54  BILITOT  --   --  0.6 0.5 0.6  PROT  --   --  6.8 6.8 6.8  ALBUMIN 2.4* 2.6* 2.6* 2.6* 2.6*   No results for input(s): LIPASE, AMYLASE in the last 168 hours. Recent Labs  Lab 03/22/20 0306  AMMONIA 18   Coagulation Profile: No results for input(s): INR, PROTIME in the last 168 hours. Cardiac Enzymes: No results for input(s): CKTOTAL, CKMB, CKMBINDEX, TROPONINI in the last 168 hours. BNP (last 3 results) No results for input(s): PROBNP in the last 8760 hours. HbA1C: No results for input(s): HGBA1C in the last 72 hours. CBG: Recent Labs  Lab 03/21/20 2007 03/22/20 0001 03/22/20 0344 03/22/20 0816 03/22/20 1134  GLUCAP 148* 309* 247* 245* 309*   Lipid Profile: No results for input(s): CHOL, HDL, LDLCALC, TRIG, CHOLHDL, LDLDIRECT in the last 72 hours. Thyroid Function Tests: No results for input(s): TSH, T4TOTAL, FREET4, T3FREE, THYROIDAB in the last 72 hours. Anemia Panel: No results for input(s): VITAMINB12, FOLATE, FERRITIN, TIBC, IRON, RETICCTPCT in the last 72 hours. Sepsis Labs: No results for input(s): PROCALCITON, LATICACIDVEN in the last 168 hours.  Recent Results (from the past 240 hour(s))  MRSA PCR Screening     Status: None   Collection Time: 03/13/20 10:42 PM   Specimen: Nasopharyngeal  Result Value Ref Range Status   MRSA by PCR NEGATIVE NEGATIVE Final    Comment:        The GeneXpert MRSA Assay (FDA approved for NASAL specimens only), is one component of a comprehensive MRSA colonization surveillance program. It is not intended to diagnose MRSA infection nor to guide or monitor treatment for MRSA infections. Performed at [facility], [address], [place], [place]          Radiology Studies: No results found.      Scheduled Meds: . amLODipine  10 mg Per Tube Daily  . aspirin  81 mg Per Tube Daily  . chlorhexidine  15 mL Mouth Rinse  BID  . clopidogrel  75 mg Per Tube Daily  . feeding supplement (PROSource TF)  45 mL Per Tube Daily  . free water  200 mL Per Tube Q6H  . heparin injection (subcutaneous)  5,000 Units Subcutaneous Q8H  . hydrALAZINE  50 mg Oral Q8H  . insulin aspart  0-15 Units Subcutaneous TID WC  . insulin aspart  3 Units Subcutaneous Q4H  . insulin detemir  7 Units Subcutaneous BID  . mouth rinse  15 mL Mouth Rinse BID  . metoprolol tartrate  100 mg Per Tube BID  . mirtazapine  7.5 mg Per Tube QHS  . pantoprazole sodium  40 mg Per Tube Daily  . Ensure Max Protein  11 oz Oral BID  . rosuvastatin  5 mg Per Tube Daily   Continuous Infusions: . feeding supplement (GLUCERNA 1.2 CAL) 1,000 mL (03/21/20 1643)     LOS: 14 days    Time spent:35 mins. More than 50% of that time was spent in counseling and/or coordination of care.      Burnadette Pop, MD [facility] P7/26/2021, 3:22 PM

## 2020-03-23 LAB — GLUCOSE, CAPILLARY
Glucose-Capillary: 267 mg/dL — ABNORMAL HIGH (ref 70–99)
Glucose-Capillary: 228 mg/dL — ABNORMAL HIGH (ref 70–99)
Glucose-Capillary: 197 mg/dL — ABNORMAL HIGH (ref 70–99)
Glucose-Capillary: 265 mg/dL — ABNORMAL HIGH (ref 70–99)
Glucose-Capillary: 214 mg/dL — ABNORMAL HIGH (ref 70–99)
Glucose-Capillary: 112 mg/dL — ABNORMAL HIGH (ref 70–99)

## 2020-03-23 LAB — CBC WITH DIFFERENTIAL/PLATELET
Abs Immature Granulocytes: 0.14 10*3/uL — ABNORMAL HIGH (ref 0.00–0.07)
Basophils Absolute: 0.1 10*3/uL (ref 0.0–0.1)
Basophils Relative: 1 %
Eosinophils Absolute: 0.5 10*3/uL (ref 0.0–0.5)
Eosinophils Relative: 4 %
HCT: 31.4 % — ABNORMAL LOW (ref 36.0–46.0)
Hemoglobin: 9.9 g/dL — ABNORMAL LOW (ref 12.0–15.0)
Immature Granulocytes: 1 %
Lymphocytes Relative: 11 %
Lymphs Abs: 1.4 10*3/uL (ref 0.7–4.0)
MCH: 28.4 pg (ref 26.0–34.0)
MCHC: 31.5 g/dL (ref 30.0–36.0)
MCV: 90.2 fL (ref 80.0–100.0)
Monocytes Absolute: 0.8 10*3/uL (ref 0.1–1.0)
Monocytes Relative: 6 %
Neutro Abs: 9.6 10*3/uL — ABNORMAL HIGH (ref 1.7–7.7)
Neutrophils Relative %: 77 %
Platelets: 365 10*3/uL (ref 150–400)
RBC: 3.48 MIL/uL — ABNORMAL LOW (ref 3.87–5.11)
RDW: 15.2 % (ref 11.5–15.5)
WBC: 12.4 10*3/uL — ABNORMAL HIGH (ref 4.0–10.5)
nRBC: 0 % (ref 0.0–0.2)

## 2020-03-23 LAB — BASIC METABOLIC PANEL
Anion gap: 13 (ref 5–15)
BUN: 103 mg/dL — ABNORMAL HIGH (ref 8–23)
CO2: 23 mmol/L (ref 22–32)
Calcium: 9.3 mg/dL (ref 8.9–10.3)
Chloride: 111 mmol/L (ref 98–111)
Creatinine, Ser: 2.18 mg/dL — ABNORMAL HIGH (ref 0.44–1.00)
GFR calc Af Amer: 25 mL/min — ABNORMAL LOW (ref >60)
GFR calc non Af Amer: 22 mL/min — ABNORMAL LOW (ref >60)
Glucose, Bld: 237 mg/dL — ABNORMAL HIGH (ref 70–99)
Potassium: 3.5 mmol/L (ref 3.5–5.1)
Sodium: 147 mmol/L — ABNORMAL HIGH (ref 135–145)

## 2020-03-23 MED ORDER — FREE WATER
300.0000 mL | Freq: Four times a day (QID) | Status: DC
  Administered 2020-03-23 – 2020-03-24 (×4): 300 mL

## 2020-03-23 MED ORDER — INSULIN DETEMIR 100 UNIT/ML ~~LOC~~ SOLN
10.0000 [IU] | Freq: Two times a day (BID) | SUBCUTANEOUS | Status: DC
  Administered 2020-03-23 – 2020-04-27 (×71): 10 [IU] via SUBCUTANEOUS
  Filled 2020-03-23 (×74): qty 0.1

## 2020-03-23 MED ORDER — INSULIN ASPART 100 UNIT/ML ~~LOC~~ SOLN
5.0000 [IU] | SUBCUTANEOUS | Status: DC
  Administered 2020-03-23 – 2020-04-06 (×75): 5 [IU] via SUBCUTANEOUS

## 2020-03-23 MED ORDER — HYDRALAZINE HCL 50 MG PO TABS
50.0000 mg | ORAL_TABLET | Freq: Three times a day (TID) | ORAL | Status: DC
  Administered 2020-03-23 – 2020-04-04 (×32): 50 mg
  Filled 2020-03-23 (×35): qty 1

## 2020-03-23 NOTE — Progress Notes (Signed)
OT Cancellation Note  Patient Details Name: Tasha [name] MRN: [id number] DOB: 1945-10-05   Cancelled Treatment:    Reason Eval/Treat Not Completed: Other (comment) Pt had just been lifted to chair by PT 15 minutes prior, pt lethargic and wanting to sleep; will follow up as time allows.   Tasha [name], COTA/L Acute Rehabilitation Services [phone]  [name] 03/23/2020, 1:52 PM

## 2020-03-23 NOTE — Plan of Care (Signed)

## 2020-03-23 NOTE — Progress Notes (Signed)
Chotiner informed of the need for pt's meds to be changed to per tube d/t pt having no diet. Pt received HS PO per tube d/t med route not changed.

## 2020-03-23 NOTE — Progress Notes (Signed)
Physical Therapy Treatment Patient Details Name: Tasha [name] MRN: [id number] DOB: [birth year] Today's Date: 03/23/2020    History of Present Illness Tasha [name] is a 74 y.o. female with history of stroke in June, HTN, MI and DM presenting 7/12 with nausea and vomiting, altered mental status, admitted w/ uremia and possible infection. In hospital, R sided weakness not improving, MRI showed new acute infarcts.     PT Comments    Patient progressing to OOB even though via total lift.  Able to get upright and work on head control longer with support for safety.  She was alert and somewhat attentive, though not initiating movement without assist.  Feel she will need SNF level rehab at d/c.    Follow Up Recommendations  SNF     Equipment Recommendations  None recommended by PT    Recommendations for Other Services       Precautions / Restrictions Precautions Precautions: Fall Precaution Comments: right hemiplegia, coretrack    Mobility  Bed Mobility Overal bed mobility: Needs Assistance Bed Mobility: Rolling Rolling: Max assist         General bed mobility comments: rolled for hygiene and to place lift pad for OOB  Transfers Overall transfer level: Needs assistance               General transfer comment: bed to chair via maximove lift/total A  Ambulation/Gait                 Stairs             Wheelchair Mobility    Modified Rankin (Stroke Patients Only) Modified Rankin (Stroke Patients Only) Pre-Morbid Rankin Score: Moderately severe disability Modified Rankin: Severe disability     Balance Overall balance assessment: Needs assistance   Sitting balance-Leahy Scale: Zero Sitting balance - Comments: mod to max A to reposition in chair for sitting safely in recliner pillows to R side and assist for head/neck control                                    Cognition Arousal/Alertness: Awake/alert Behavior During Therapy: Flat  affect Overall Cognitive Status: Difficult to assess                                        Exercises Other Exercises Other Exercises: PROM/AAROM bilateral UE/LE in bed x 3-5 reps    General Comments        Pertinent Vitals/Pain Pain Assessment: Faces Faces Pain Scale: Hurts little more Pain Location: grimacing with movement L side Pain Descriptors / Indicators: Grimacing Pain Intervention(s): Monitored during session;Repositioned;Limited activity within patient's tolerance    Home Living                      Prior Function            PT Goals (current goals can now be found in the care plan section) Progress towards PT goals: Progressing toward goals    Frequency    Min 2X/week      PT Plan Current plan remains appropriate    Co-evaluation              AM-PAC PT "6 Clicks" Mobility   Outcome Measure  Help needed turning from your back to your side while in a flat bed  without using bedrails?: Total Help needed moving from lying on your back to sitting on the side of a flat bed without using bedrails?: Total Help needed moving to and from a bed to a chair (including a wheelchair)?: Total Help needed standing up from a chair using your arms (e.g., wheelchair or bedside chair)?: Total Help needed to walk in hospital room?: Total Help needed climbing 3-5 steps with a railing? : Total 6 Click Score: 6    End of Session   Activity Tolerance: Patient tolerated treatment well Patient left: in chair;with call bell/phone within reach;with chair alarm set Nurse Communication: Mobility status;Need for lift equipment PT Visit Diagnosis: Hemiplegia and hemiparesis;Repeated falls (R29.6);Other symptoms and signs involving the nervous system (R29.898) Hemiplegia - Right/Left: Right Hemiplegia - dominant/non-dominant: Dominant Hemiplegia - caused by: Cerebral infarction     Time: 1200-1242 PT Time Calculation (min) (ACUTE ONLY): 42  min  Charges:  $Therapeutic Exercise: 8-22 mins $Therapeutic Activity: 23-37 mins                     Tasha [name], PT Acute Rehabilitation Services Pager:[phone] [phone] 03/23/2020    Tasha [name] 03/23/2020, 5:19 PM

## 2020-03-23 NOTE — Progress Notes (Signed)
Inpatient Diabetes Program Recommendations  AACE/ADA: New Consensus Statement on Inpatient Glycemic Control (2015)  Target Ranges:  Prepandial:   less than 140 mg/dL      Peak postprandial:   less than 180 mg/dL (1-2 hours)      Critically ill patients:  140 - 180 mg/dL   Lab Results  Component Value Date   GLUCAP 265 (H) 03/23/2020   HGBA1C 6.9 (H) 02/15/2020    Review of Glycemic Control Results for Tasha [name], Tasha [name] (MRN [id number]) as of 03/23/2020 12:00  Ref. Range 03/22/2020 20:08 03/22/2020 23:51 03/23/2020 03:13 03/23/2020 07:31 03/23/2020 11:04  Glucose-Capillary Latest Ref Range: 70 - 99 mg/dL 350 (H) 093 (H) 818 (H) 197 (H) 265 (H)   Diabetes history:  DM2 Outpatient Diabetes medications:  Metformin 1000 mg daily  Current orders for Inpatient glycemic control:  Levemir 7 units bid  Novolog 0-15 units TID Novolog 3 units Q4H tube feed coverage  Prosource @ 45 ml/hr  Inpatient Diabetes Program Recommendations:     Levemir 10 units BID Novolog 5 units Q4H tube feed coverage-hold if tube feeds stopped   Will continue to follow while inpatient.  Thank you, Dulce Sellar, RN, BSN Diabetes Coordinator Inpatient Diabetes Program [phone] (team pager from 8a-5p)

## 2020-03-23 NOTE — Progress Notes (Addendum)
PROGRESS NOTE    Tasha [name]  JOI:[id number] DOB: 05-30-1946 DOA: 03/08/2020 PCP: Patient, No Pcp Per   Brief Narrative: Patient is a 74 year old with a history of stroke, DM2, HTN, and MI who was discharged June24,2021 following a stroke work-up and return to the hospital 03/08/2020 with 2 days of intractable nausea and vomiting along with altered mental status. On presentation,She was found to be have  lactic acidosis and acute kidney injury. Following admission the patient improved with supportive care. Due to persisting right-sided weakness which was felt to be worse than previously documented, an MRI was obtained  on 7/18 and showed  an acute left pontine infarct and acute small infarct on the posterior body of the right lateral ventricle. Neurology was following, now signed off.  Stroke work-up completed.  She is still on tube feeding because of severe dysphagia.  Plan is to discharge her to LTAC versus skilled nursing facility.SW closely following.  She is medically stable for discharge as soon as bed is available at [facility] ([place]  Assessment & Plan:   Active Problems:   CVA (cerebral vascular accident) (HCC)   DM (diabetes mellitus), type 2 with peripheral vascular complications (HCC)   Essential (primary) hypertension   Lactic acidosis   Nausea and vomiting   AKI (acute kidney injury) (HCC)   Hyperkalemia   Sepsis (HCC)   Coffee ground emesis  Acute CVA  On 7/18 with history of recent CVA June 2021 MRI as above Patient was on clopidogrel 75 mg dailyprior to admission, now on aspirin 81 mg daily and clopidogrel 75 mg daily.Continue DAPT x 3 weeks again then plavix alone She is also on crestor  Acute kidney injury Most consistent with prerenal state, w/ probable component of ATN Bun/cr on admission at 149/9.5, improving nicely  Nephrology signed off, nephrology will set up follow-up appointment with Dr. Malen Gauze at [facility] 2 weeks after discharge  Hypernatremia Monitor  BMP.  Increased the quantity of free water  Acute toxic metabolic encephalopathy Due  lacunaracute CVA,on presentation,uremia was suspected to have contributed to AMS EEGunrevealing TSH normal-B12 and folic acid not deficient  SIRS v/s Sepsis- POA Hypothermia, leukocytosis, and lactic acidosis present at admission-no clear source of infection identified-CXR at presentation with atelectasis only-CT abdomen pelvis without acute findings-UA not consistent with UTI and culture without growth-blood cultures no growth x2-was treated with broad-spectrum empiric antibiotic for 7-day course  Intractable nausea vomiting and coffee-ground emesis Hemoccult negative-probable Mallory-Weiss teardue to recurrent vomiting caused by uremia-all anticoagulants/antiplatelet meds held initially but resumed Plavix 7/17 and aspirin 7/19  Dysphagia NPO as per  SLP recommendations On tube feeding  Noninsulin-dependent type II DM 2, controlled, A1c 6.9 Home medication Metformin held Continue current insulin regimen.  Diabetic oriented following.  HTN lopressor to 100 mg twice a day, Norvasc 10 mg daily BP stable  NSVT, keep mag>2, k>4,   Depression: Family desires treatment ,on  low-dose Remeron,   Disposition: Continues to require defeating for feeding propose, unable to swallow.  In current circumstances, needs LTAC.  Social worker following  Goals of care: Had a long discussion with the son on 03/23/20.  Given her debility, poor quality of life, I offered him palliative care consultation discuss about goals of care.  Son is currently focused on placing her in LTAC and he wants to follow-up with palliative care as an outpatient.  Nutrition Problem: Inadequate oral intake Etiology: lethargy/confusion      DVT prophylaxis:Heparin [place] Code Status: Full Family Communication: called son  phone on 03/23/20 Status is: Inpatient  Remains inpatient appropriate because:Unsafe d/c  plan   Dispo:  Patient From: Skilled Nursing Facility  Planned Disposition: Skilled Nursing Facility  Vs LTACH  Expected discharge date: Not sure  Medically stable for discharge: yes    Consultants: Neurology  Procedures: Tube feeding placement Antimicrobials:  Anti-infectives (From admission, onward)   Start     Dose/Rate Route Frequency Ordered Stop   03/10/20 1200  cefTRIAXone (ROCEPHIN) 2 g in sodium chloride 0.9 % 100 mL IVPB        2 g 200 mL/hr over 30 Minutes Intravenous Every 24 hours 03/10/20 0924 03/14/20 1333   03/09/20 1000  ceFEPIme (MAXIPIME) 1 g in sodium chloride 0.9 % 100 mL IVPB  Status:  Discontinued        1 g 200 mL/hr over 30 Minutes Intravenous Every 24 hours 03/08/20 1219 03/10/20 0924   03/08/20 1219  vancomycin variable dose per unstable renal function (pharmacist dosing)  Status:  Discontinued         Does not apply See admin instructions 03/08/20 1219 03/10/20 0906   03/08/20 1100  vancomycin (VANCOCIN) IVPB 1000 mg/200 mL premix        1,000 mg 200 mL/hr over 60 Minutes Intravenous  Once 03/08/20 1046 03/08/20 1406   03/08/20 1100  ceFEPIme (MAXIPIME) 2 g in sodium chloride 0.9 % 100 mL IVPB        2 g 200 mL/hr over 30 Minutes Intravenous  Once 03/08/20 1046 03/08/20 1211      Subjective: Patient seen and examined at the bedside this morning.  Hemodynamically stable.  Very weak, lying on the bed.  Tries to speak and talks few words  when communicated.  She said the current month is July  Objective: Vitals:   03/23/20 0315 03/23/20 0343 03/23/20 0452 03/23/20 0733  BP: (!) 156/60  (!) 148/68 (!) 144/62  Pulse: 72   76  Resp: 19   20  Temp: 98.9 F (37.2 C)   98.8 F (37.1 C)  TempSrc: Oral   Axillary  SpO2: 96%   95%  Weight:  78.9 kg    Height:        Intake/Output Summary (Last 24 hours) at 03/23/2020 0852 Last data filed at 03/23/2020 0733 Gross per 24 hour  Intake 2574 ml  Output 1900 ml  Net 674 ml   Filed Weights   03/15/20  0453 03/16/20 0158 03/23/20 0343  Weight: 62.4 kg 60.5 kg 78.9 kg    Examination: General exam: Extremely deconditioned, debilitated, weak  HEENT:feeding tube Respiratory system: Bilateral equal air entry, normal vesicular breath sounds, no wheezes or crackles  Cardiovascular system: S1 & S2 heard, RRR. No JVD, murmurs, rubs, gallops or clicks. Gastrointestinal system: Abdomen is nondistended, soft and nontender. No organomegaly or masses felt. Normal bowel sounds heard. Central nervous system: Sleepy.obeys commands, tells correct month , global weakness extremities: No edema, no clubbing ,no cyanosis, Skin: No rashes, lesions or ulcers,no icterus ,no pallor  Data Reviewed: I have personally reviewed following labs and imaging studies  CBC: Recent Labs  Lab 03/18/20 0519 03/19/20 1240 03/20/20 0216 03/22/20 0306 03/23/20 0346  WBC 10.7* 12.7* 11.6* 14.2* 12.4*  NEUTROABS 8.0* 10.2* 9.1* 10.9* 9.6*  HGB 10.0* 11.1* 10.4* 10.9* 9.9*  HCT 31.4* 34.2* 31.5* 33.6* 31.4*  MCV 89.0 87.9 88.0 88.4 90.2  PLT 208 312 339 433* 365   Basic Metabolic Panel: Recent Labs  Lab 03/16/20 1155 03/16/20 1155 03/17/20  [id number] 03/17/20 0411 03/18/20 [id number] 03/18/20 0519 03/19/20 1240 03/20/20 0216 03/21/20 0415 03/22/20 0306 03/23/20 0346  NA 138   < > 138   < > 142   < > 144 146* 145 146* 147*  K 3.6   < > 3.9   < > 4.0   < > 4.0 3.8 3.2* 3.9 3.5  CL 103   < > 104   < > 107   < > 108 109 109 110 111  CO2 25   < > 23   < > 24   < > 22 23 23 22 23   GLUCOSE 198*   < > 223*   < > 214*   < > 225* 231* 219* 293* 237*  BUN 63*   < > 65*   < > 63*   < > 56* 63* 77* 92* 103*  CREATININE 4.04*   < > 3.81*   < > 3.07*   < > 2.25* 2.25* 2.14* 2.27* 2.18*  CALCIUM 8.7*   < > 9.0   < > 9.2   < > 9.8 9.7 9.4 9.5 9.3  MG  --   --  1.6*   < > 1.5*  --  2.1 1.9 1.8 2.6*  --   PHOS 4.4  --  4.5  4.6  --   --   --   --   --   --   --   --    < > = values in this interval not displayed.   GFR: Estimated  Creatinine Clearance: 23.4 mL/min (A) (by C-G formula based on SCr of 2.18 mg/dL (H)). Liver Function Tests: Recent Labs  Lab 03/16/20 1155 03/17/20 0411 03/20/20 0216 03/21/20 0415 03/22/20 0306  AST  --   --  48* 29 60*  ALT  --   --  95* 91* 121*  ALKPHOS  --   --  57 56 54  BILITOT  --   --  0.6 0.5 0.6  PROT  --   --  6.8 6.8 6.8  ALBUMIN 2.4* 2.6* 2.6* 2.6* 2.6*   No results for input(s): LIPASE, AMYLASE in the last 168 hours. Recent Labs  Lab 03/22/20 0306  AMMONIA 18   Coagulation Profile: No results for input(s): INR, PROTIME in the last 168 hours. Cardiac Enzymes: No results for input(s): CKTOTAL, CKMB, CKMBINDEX, TROPONINI in the last 168 hours. BNP (last 3 results) No results for input(s): PROBNP in the last 8760 hours. HbA1C: No results for input(s): HGBA1C in the last 72 hours. CBG: Recent Labs  Lab 03/22/20 1545 03/22/20 2008 03/22/20 2351 03/23/20 0313 03/23/20 0731  GLUCAP 302* 205* 289* 228* 197*   Lipid Profile: No results for input(s): CHOL, HDL, LDLCALC, TRIG, CHOLHDL, LDLDIRECT in the last 72 hours. Thyroid Function Tests: No results for input(s): TSH, T4TOTAL, FREET4, T3FREE, THYROIDAB in the last 72 hours. Anemia Panel: No results for input(s): VITAMINB12, FOLATE, FERRITIN, TIBC, IRON, RETICCTPCT in the last 72 hours. Sepsis Labs: No results for input(s): PROCALCITON, LATICACIDVEN in the last 168 hours.  Recent Results (from the past 240 hour(s))  MRSA PCR Screening     Status: None   Collection Time: 03/13/20 10:42 PM   Specimen: Nasopharyngeal  Result Value Ref Range Status   MRSA by PCR NEGATIVE NEGATIVE Final    Comment:        The GeneXpert MRSA Assay (FDA approved for NASAL specimens only), is one component of a comprehensive MRSA colonization surveillance program. It  is not intended to diagnose MRSA infection nor to guide or monitor treatment for MRSA infections. Performed at [facility], [address] [place], [place] [address]          Radiology Studies: No results found.      Scheduled Meds: . amLODipine  10 mg Per Tube Daily  . aspirin  81 mg Per Tube Daily  . chlorhexidine  15 mL Mouth Rinse BID  . clopidogrel  75 mg Per Tube Daily  . feeding supplement (PROSource TF)  45 mL Per Tube Daily  . free water  200 mL Per Tube Q6H  . heparin injection (subcutaneous)  5,000 Units Subcutaneous Q8H  . hydrALAZINE  50 mg Per Tube Q8H  . insulin aspart  0-15 Units Subcutaneous TID WC  . insulin aspart  3 Units Subcutaneous Q4H  . insulin detemir  7 Units Subcutaneous BID  . mouth rinse  15 mL Mouth Rinse BID  . metoprolol tartrate  100 mg Per Tube BID  . mirtazapine  7.5 mg Per Tube QHS  . pantoprazole sodium  40 mg Per Tube Daily  . Ensure Max Protein  11 oz Oral BID  . rosuvastatin  5 mg Per Tube Daily   Continuous Infusions: . feeding supplement (GLUCERNA 1.2 CAL) 1,000 mL (03/22/20 1707)     LOS: 15 days    Time spent:35 mins. More than 50% of that time was spent in counseling and/or coordination of care.      Burnadette Pop, MD [facility] P7/27/2021, 8:52 AM

## 2020-03-23 NOTE — Progress Notes (Signed)
LATE NOTE SUBMISSION   CSW asked by MD to review for possible LTACH placement. Patient is not appropriate for LTACH, not meeting requirements at this time.  Patient also cannot return to SNF due to cortrak. CSW to follow.  Blenda Nicely, [place] Clinical Social Worker [phone]

## 2020-03-24 LAB — BASIC METABOLIC PANEL
Anion gap: 16 — ABNORMAL HIGH (ref 5–15)
BUN: 124 mg/dL — ABNORMAL HIGH (ref 8–23)
CO2: 21 mmol/L — ABNORMAL LOW (ref 22–32)
Calcium: 9.6 mg/dL (ref 8.9–10.3)
Chloride: 111 mmol/L (ref 98–111)
Creatinine, Ser: 2.19 mg/dL — ABNORMAL HIGH (ref 0.44–1.00)
GFR calc Af Amer: 25 mL/min — ABNORMAL LOW (ref >60)
GFR calc non Af Amer: 22 mL/min — ABNORMAL LOW (ref >60)
Glucose, Bld: 190 mg/dL — ABNORMAL HIGH (ref 70–99)
Potassium: 3.5 mmol/L (ref 3.5–5.1)
Sodium: 148 mmol/L — ABNORMAL HIGH (ref 135–145)

## 2020-03-24 LAB — GLUCOSE, CAPILLARY
Glucose-Capillary: 115 mg/dL — ABNORMAL HIGH (ref 70–99)
Glucose-Capillary: 134 mg/dL — ABNORMAL HIGH (ref 70–99)
Glucose-Capillary: 143 mg/dL — ABNORMAL HIGH (ref 70–99)
Glucose-Capillary: 161 mg/dL — ABNORMAL HIGH (ref 70–99)
Glucose-Capillary: 169 mg/dL — ABNORMAL HIGH (ref 70–99)
Glucose-Capillary: 308 mg/dL — ABNORMAL HIGH (ref 70–99)

## 2020-03-24 MED ORDER — FREE WATER
400.0000 mL | Freq: Four times a day (QID) | Status: DC
  Administered 2020-03-24 – 2020-04-04 (×38): 400 mL

## 2020-03-24 NOTE — Progress Notes (Signed)
  Speech Language Pathology Treatment: Dysphagia  Patient Details Name: Tasha [name] MRN: [id number] DOB: 30-Nov-1945 Today's Date: 03/24/2020 Time: 9629-5284 SLP Time Calculation (min) (ACUTE ONLY): 11 min  Assessment / Plan / Recommendation Clinical Impression  Pt makes no attempts at labial seal or bolus manipulation when presented with various consistencies despite cues. Total A was provided by SLP to remove boluses from her mouth via yankauer. With no evidence of swallow initiation per hyolaryngeal palpation, she is not yet ready for PO diet or any instrumental testing of swallowing. Would continue to use alternative means of nutrition and address overall GOC.   HPI HPI: Tasha [name] is a 74 y.o. female with history of stroke in June, HTN, MI and DM presenting 7/12 with nausea and vomiting, altered mental status, admitted w/ uremia and possible infection. In hospital, R sided weakness not improving, MRI showed acute left pontine infarction. Acute small infarct along the posterior body of the right lateral ventricle. Seen for swallowing during admission 02/17/20 >nectar thick then upgraded to thin/full liquids.       SLP Plan  Continue with current plan of care       Recommendations  Diet recommendations: NPO Medication Administration: Via alternative means                Oral Care Recommendations: Oral care QID Follow up Recommendations: Skilled Nursing facility;24 hour supervision/assistance SLP Visit Diagnosis: Dysphagia, unspecified (R13.10) Plan: Continue with current plan of care       GO                Mahala Menghini., M.A. CCC-SLP Acute Rehabilitation Services Pager [phone] Office [phone]  03/24/2020, 9:58 AM

## 2020-03-24 NOTE — Plan of Care (Signed)

## 2020-03-24 NOTE — Progress Notes (Signed)
PROGRESS NOTE    Tasha [name]  OVF:[id number] DOB: 1945-11-09 DOA: 03/08/2020 PCP: Patient, No Pcp Per   Brief Narrative: Patient is a 74 year old with a history of stroke, DM2, HTN, and MI who was discharged June24,2021 following a stroke work-up and return to the hospital 03/08/2020 with 2 days of intractable nausea and vomiting along with altered mental status. On presentation,She was found to be have  lactic acidosis and acute kidney injury. Following admission the patient improved with supportive care. Due to persisting right-sided weakness which was felt to be worse than previously documented, an MRI was obtained  on 7/18 and showed  an acute left pontine infarct and acute small infarct on the posterior body of the right lateral ventricle. Neurology was following, now signed off.  Stroke work-up completed.  She is still on tube feeding because of severe dysphagia.  Plan is to discharge her to LTAC versus skilled nursing facility.SW closely following.  Since her overall status has not significantly improved, we have requested palliative care for goals of care discussion/potential hospice consideration.  Assessment & Plan:   Active Problems:   CVA (cerebral vascular accident) (HCC)   DM (diabetes mellitus), type 2 with peripheral vascular complications (HCC)   Essential (primary) hypertension   Lactic acidosis   Nausea and vomiting   AKI (acute kidney injury) (HCC)   Hyperkalemia   Sepsis (HCC)   Coffee ground emesis  Acute CVA  On 7/18 with history of recent CVA June 2021 MRI as above Patient was on clopidogrel 75 mg dailyprior to admission, now on aspirin 81 mg daily and clopidogrel 75 mg daily.Continue DAPT x 3 weeks again then plavix alone She is also on crestor  Acute kidney injury Most consistent with prerenal state, w/ probable component of ATN Bun/cr on admission at 149/9.5, improving nicely  Nephrology signed off, nephrology will set up follow-up appointment with  Dr. Malen Gauze at [facility] 2 weeks after discharge  Hypernatremia Monitor BMP.  Increased the quantity of free water  Acute toxic metabolic encephalopathy Due  lacunaracute CVA,on presentation uremia was also suspected to have contributed to AMS EEGunrevealing TSH normal-B12 and folic acid not deficient  SIRS v/s Sepsis- POA Hypothermia, leukocytosis, and lactic acidosis present at admission-no clear source of infection identified-CXR at presentation with atelectasis only-CT abdomen pelvis without acute findings-UA not consistent with UTI and culture without growth-blood cultures no growth x2-was treated with broad-spectrum empiric antibiotic for 7-day course  Intractable nausea vomiting and coffee-ground emesis Hemoccult negative-probable Mallory-Weiss teardue to recurrent vomiting caused by uremia-all anticoagulants/antiplatelet meds held initially but resumed Plavix on 7/17 and aspirin on  7/19  Dysphagia NPO as per  SLP recommendations On tube feeding  Noninsulin-dependent type II DM 2, controlled, A1c 6.9 Home medication Metformin held Continue current insulin regimen.  Diabetic coordinator ollowing.  HTN lopressor to 100 mg twice a day, Norvasc 10 mg daily BP stable  NSVT, keep mag>2, k>4,   Depression: Family desires treatment ,on  low-dose Remeron,   Disposition: Continues to require defeating for feeding propose, unable to swallow.  In current circumstances, needs LTAC.  Social worker following  Goals of care: Had a long discussion with the son on 03/23/20.  Given her debility, poor quality of life, I offered him palliative care consultation discuss about goals of care.  Son is currently focused on placing her in [place] .  But since her overall status is not improving and she is dependent on tube feeds, she might be a candidate for  hospice.  If family do not want hospice then we might consider PEG placement but this is challenging.Will be in touch  with family  Nutrition Problem: Inadequate oral intake Etiology: lethargy/confusion      DVT prophylaxis:Heparin [place] Code Status: Full Family Communication: called son phone on 03/23/20 Status is: Inpatient  Remains inpatient appropriate because:Unsafe d/c plan   Dispo:  Patient From: Skilled Nursing Facility  Planned Disposition: Skilled Nursing Facility  Vs LTACH  Expected discharge date: Not sure  Medically stable for discharge: yes    Consultants: Neurology  Procedures: Tube feeding placement Antimicrobials:  Anti-infectives (From admission, onward)   Start     Dose/Rate Route Frequency Ordered Stop   03/10/20 1200  cefTRIAXone (ROCEPHIN) 2 g in sodium chloride 0.9 % 100 mL IVPB        2 g 200 mL/hr over 30 Minutes Intravenous Every 24 hours 03/10/20 0924 03/14/20 1333   03/09/20 1000  ceFEPIme (MAXIPIME) 1 g in sodium chloride 0.9 % 100 mL IVPB  Status:  Discontinued        1 g 200 mL/hr over 30 Minutes Intravenous Every 24 hours 03/08/20 1219 03/10/20 0924   03/08/20 1219  vancomycin variable dose per unstable renal function (pharmacist dosing)  Status:  Discontinued         Does not apply See admin instructions 03/08/20 1219 03/10/20 0906   03/08/20 1100  vancomycin (VANCOCIN) IVPB 1000 mg/200 mL premix        1,000 mg 200 mL/hr over 60 Minutes Intravenous  Once 03/08/20 1046 03/08/20 1406   03/08/20 1100  ceFEPIme (MAXIPIME) 2 g in sodium chloride 0.9 % 100 mL IVPB        2 g 200 mL/hr over 30 Minutes Intravenous  Once 03/08/20 1046 03/08/20 1211      Subjective: Patient seen and examined at the bedside this morning.  Hemodynamically stable.  Remains weak, lethargic.  Opens her eyes on calling her name.  Tube feed dependent  Objective: Vitals:   03/24/20 0322 03/24/20 0345 03/24/20 0612 03/24/20 0626  BP: (!) 125/54  (!) 116/51 (!) 148/52  Pulse: 90  85   Resp: 18  18   Temp: 99.2 F (37.3 C)     TempSrc: Oral     SpO2: 96%  98%   Weight:  80.3 kg      Height:        Intake/Output Summary (Last 24 hours) at 03/24/2020 0809 Last data filed at 03/24/2020 0020 Gross per 24 hour  Intake 1381.5 ml  Output 1100 ml  Net 281.5 ml   Filed Weights   03/16/20 0158 03/23/20 0343 03/24/20 0345  Weight: 60.5 kg 78.9 kg 80.3 kg    Examination:  General exam: Extremely deconditioned, debilitated, lethargic HEENT: Feeding tube Respiratory system: Diminished air sounds on the bases  cardiovascular system: S1 & S2 heard, RRR. No JVD, murmurs, rubs, gallops or clicks. Gastrointestinal system: Abdomen is nondistended, soft and nontender. No organomegaly or masses felt. Normal bowel sounds heard. Central nervous system: Lethargic, sleepy, tries to speak Extremities: No edema, no clubbing ,no cyanosis Skin: No rashes, lesions or ulcers,no icterus ,no pallor  Data Reviewed: I have personally reviewed following labs and imaging studies  CBC: Recent Labs  Lab 03/18/20 0519 03/19/20 1240 03/20/20 0216 03/22/20 0306 03/23/20 0346  WBC 10.7* 12.7* 11.6* 14.2* 12.4*  NEUTROABS 8.0* 10.2* 9.1* 10.9* 9.6*  HGB 10.0* 11.1* 10.4* 10.9* 9.9*  HCT 31.4* 34.2* 31.5* 33.6* 31.4*  MCV  89.0 87.9 88.0 88.4 90.2  PLT 208 312 339 433* 365   Basic Metabolic Panel: Recent Labs  Lab 03/18/20 0519 03/18/20 0519 03/19/20 1240 03/19/20 1240 03/20/20 0216 03/21/20 0415 03/22/20 0306 03/23/20 0346 03/24/20 0306  NA 142   < > 144   < > 146* 145 146* 147* 148*  K 4.0   < > 4.0   < > 3.8 3.2* 3.9 3.5 3.5  CL 107   < > 108   < > 109 109 110 111 111  CO2 24   < > 22   < > 23 23 22 23  21*  GLUCOSE 214*   < > 225*   < > 231* 219* 293* 237* 190*  BUN 63*   < > 56*   < > 63* 77* 92* 103* 124*  CREATININE 3.07*   < > 2.25*   < > 2.25* 2.14* 2.27* 2.18* 2.19*  CALCIUM 9.2   < > 9.8   < > 9.7 9.4 9.5 9.3 9.6  MG 1.5*  --  2.1  --  1.9 1.8 2.6*  --   --    < > = values in this interval not displayed.   GFR: Estimated Creatinine Clearance: 23.4 mL/min (A) (by  C-G formula based on SCr of 2.19 mg/dL (H)). Liver Function Tests: Recent Labs  Lab 03/20/20 0216 03/21/20 0415 03/22/20 0306  AST 48* 29 60*  ALT 95* 91* 121*  ALKPHOS 57 56 54  BILITOT 0.6 0.5 0.6  PROT 6.8 6.8 6.8  ALBUMIN 2.6* 2.6* 2.6*   No results for input(s): LIPASE, AMYLASE in the last 168 hours. Recent Labs  Lab 03/22/20 0306  AMMONIA 18   Coagulation Profile: No results for input(s): INR, PROTIME in the last 168 hours. Cardiac Enzymes: No results for input(s): CKTOTAL, CKMB, CKMBINDEX, TROPONINI in the last 168 hours. BNP (last 3 results) No results for input(s): PROBNP in the last 8760 hours. HbA1C: No results for input(s): HGBA1C in the last 72 hours. CBG: Recent Labs  Lab 03/23/20 1104 03/23/20 1638 03/23/20 2016 03/24/20 0013 03/24/20 0349  GLUCAP 265* 214* 112* 143* 169*   Lipid Profile: No results for input(s): CHOL, HDL, LDLCALC, TRIG, CHOLHDL, LDLDIRECT in the last 72 hours. Thyroid Function Tests: No results for input(s): TSH, T4TOTAL, FREET4, T3FREE, THYROIDAB in the last 72 hours. Anemia Panel: No results for input(s): VITAMINB12, FOLATE, FERRITIN, TIBC, IRON, RETICCTPCT in the last 72 hours. Sepsis Labs: No results for input(s): PROCALCITON, LATICACIDVEN in the last 168 hours.  No results found for this or any previous visit (from the past 240 hour(s)).       Radiology Studies: No results found.      Scheduled Meds: . amLODipine  10 mg Per Tube Daily  . aspirin  81 mg Per Tube Daily  . chlorhexidine  15 mL Mouth Rinse BID  . clopidogrel  75 mg Per Tube Daily  . feeding supplement (PROSource TF)  45 mL Per Tube Daily  . free water  400 mL Per Tube Q6H  . heparin injection (subcutaneous)  5,000 Units Subcutaneous Q8H  . hydrALAZINE  50 mg Per Tube Q8H  . insulin aspart  0-15 Units Subcutaneous TID WC  . insulin aspart  5 Units Subcutaneous Q4H  . insulin detemir  10 Units Subcutaneous BID  . mouth rinse  15 mL Mouth Rinse  BID  . metoprolol tartrate  100 mg Per Tube BID  . mirtazapine  7.5 mg Per Tube QHS  .  pantoprazole sodium  40 mg Per Tube Daily  . Ensure Max Protein  11 oz Oral BID  . rosuvastatin  5 mg Per Tube Daily   Continuous Infusions: . feeding supplement (GLUCERNA 1.2 CAL) 1,000 mL (03/23/20 1634)     LOS: 16 days    Time spent:35 mins. More than 50% of that time was spent in counseling and/or coordination of care.      Burnadette Pop, MD [facility] P7/28/2021, 8:09 AM

## 2020-03-24 NOTE — Progress Notes (Signed)
Occupational Therapy Treatment Patient Details Name: Tasha [name] MRN: [id number] DOB: 1945/10/06 Today's Date: 03/24/2020    History of present illness Tasha [name] is a 74 y.o. female with history of stroke in June, HTN, MI and DM presenting 7/12 with nausea and vomiting, altered mental status, admitted w/ uremia and possible infection. In hospital, R sided weakness not improving, MRI showed new acute infarcts.    OT comments  Patient continues to make progress towards goals in skilled OT session. Patient's session encompassed bed mobility exercises due to minimal alert affect or participation. Pt significantly lethargic in session, and unable to follow any commands. Pain noted in pt's face when ranging neck due to increased preference to R side, however unable to open eyes or verbalize to date. No movement noted when ranging BUEs or when repositioning/turning at end of session. Discharge remains appropriate at this time; will continue to follow acutely.    Follow Up Recommendations  SNF    Equipment Recommendations  Other (comment) (Defer to next venue)    Recommendations for Other Services      Precautions / Restrictions Precautions Precautions: Fall Precaution Comments: right hemiplegia, coretrack Restrictions Weight Bearing Restrictions: No       Mobility Bed Mobility Overal bed mobility: Needs Assistance Bed Mobility: Rolling Rolling: +2 for physical assistance;+2 for safety/equipment;Total assist         General bed mobility comments: Rolled to reposition after session  Transfers                      Balance                                           ADL either performed or assessed with clinical judgement   ADL                                       Functional mobility during ADLs: Total assistance;+2 for physical assistance;+2 for safety/equipment General ADL Comments: total A for all ADLs at bed level..session focus  on bed mobility and exercises due to increased lethargy     Vision       Perception     Praxis      Cognition Arousal/Alertness: Lethargic   Overall Cognitive Status: Difficult to assess                                 General Comments: Pt responded once to therapist calling her name by opening her eyes, otherwise could not visually attend or follow any commands to date        Exercises Hand Exercises Wrist Flexion: PROM;Both;10 reps Wrist Extension: PROM;Both;10 reps Digit Composite Flexion: PROM;Both;10 reps Composite Extension: PROM;Both;10 reps Low Level/ICU Exercises Shoulder Flexion: PROM;Both;10 reps Elbow Flexion: PROM;Both;10 reps Other Exercises Other Exercises: PROM Circumduction completed to each digit x10   Shoulder Instructions       General Comments      Pertinent Vitals/ Pain          Home Living  Prior Functioning/Environment              Frequency  Min 2X/week        Progress Toward Goals  OT Goals(current goals can now be found in the care plan section)  Progress towards OT goals: Not progressing toward goals - comment (Minimally participatory in session, no active movement noted)  Acute Rehab OT Goals Patient Stated Goal: Unable to state OT Goal Formulation: Patient unable to participate in goal setting Time For Goal Achievement: 03/31/20 Potential to Achieve Goals: Poor  Plan Discharge plan remains appropriate    Co-evaluation                 AM-PAC OT "6 Clicks" Daily Activity     Outcome Measure   Help from another person eating meals?: Total Help from another person taking care of personal grooming?: Total Help from another person toileting, which includes using toliet, bedpan, or urinal?: Total Help from another person bathing (including washing, rinsing, drying)?: Total Help from another person to put on and taking off regular upper  body clothing?: Total Help from another person to put on and taking off regular lower body clothing?: Total 6 Click Score: 6    End of Session    OT Visit Diagnosis: Unsteadiness on feet (R26.81);History of falling (Z91.81);Muscle weakness (generalized) (M62.81);Other symptoms and signs involving the nervous system (R29.898);Other symptoms and signs involving cognitive function;Cognitive communication deficit (R41.841);Other abnormalities of gait and mobility (R26.89)   Activity Tolerance Patient limited by lethargy   Patient Left in bed;with call bell/phone within reach;with bed alarm set   Nurse Communication Mobility status;Other (comment) (Minimally responsive in session)        Time: 7673-4193 OT Time Calculation (min): 14 min  Charges: OT General Charges $OT Visit: 1 Visit OT Treatments $Self Care/Home Management : 8-22 mins   Pollyann Glen E. Adis Sturgill, COTA/L Acute Rehabilitation Services [phone]  [name] 03/24/2020, 2:25 PM

## 2020-03-25 DIAGNOSIS — Z515 Encounter for palliative care: Secondary | ICD-10-CM

## 2020-03-25 LAB — BASIC METABOLIC PANEL
Anion gap: 14 (ref 5–15)
BUN: 122 mg/dL — ABNORMAL HIGH (ref 8–23)
CO2: 21 mmol/L — ABNORMAL LOW (ref 22–32)
Calcium: 9 mg/dL (ref 8.9–10.3)
Chloride: 104 mmol/L (ref 98–111)
Creatinine, Ser: 2.07 mg/dL — ABNORMAL HIGH (ref 0.44–1.00)
GFR calc Af Amer: 27 mL/min — ABNORMAL LOW (ref >60)
GFR calc non Af Amer: 23 mL/min — ABNORMAL LOW (ref >60)
Glucose, Bld: 180 mg/dL — ABNORMAL HIGH (ref 70–99)
Potassium: 3.3 mmol/L — ABNORMAL LOW (ref 3.5–5.1)
Sodium: 139 mmol/L (ref 135–145)

## 2020-03-25 LAB — GLUCOSE, CAPILLARY
Glucose-Capillary: 159 mg/dL — ABNORMAL HIGH (ref 70–99)
Glucose-Capillary: 173 mg/dL — ABNORMAL HIGH (ref 70–99)
Glucose-Capillary: 190 mg/dL — ABNORMAL HIGH (ref 70–99)
Glucose-Capillary: 210 mg/dL — ABNORMAL HIGH (ref 70–99)
Glucose-Capillary: 242 mg/dL — ABNORMAL HIGH (ref 70–99)
Glucose-Capillary: 246 mg/dL — ABNORMAL HIGH (ref 70–99)
Glucose-Capillary: 258 mg/dL — ABNORMAL HIGH (ref 70–99)
Glucose-Capillary: 311 mg/dL — ABNORMAL HIGH (ref 70–99)

## 2020-03-25 MED ORDER — POTASSIUM CHLORIDE 20 MEQ PO PACK
40.0000 meq | PACK | Freq: Once | ORAL | Status: AC
  Administered 2020-03-25: 40 meq via ORAL
  Filled 2020-03-25: qty 2

## 2020-03-25 MED ORDER — GLUCERNA 1.2 CAL PO LIQD
1000.0000 mL | ORAL | Status: DC
  Administered 2020-03-25 – 2020-04-08 (×14): 1000 mL
  Filled 2020-03-25 (×24): qty 1000

## 2020-03-25 MED ORDER — CHLORHEXIDINE GLUCONATE CLOTH 2 % EX PADS
6.0000 | MEDICATED_PAD | Freq: Every day | CUTANEOUS | Status: DC
  Administered 2020-03-25 – 2020-04-27 (×34): 6 via TOPICAL

## 2020-03-25 NOTE — Progress Notes (Signed)
PROGRESS NOTE    Tasha [name]  INO:[id number] DOB: 07-18-1946 DOA: 03/08/2020 PCP: Patient, No Pcp Per   Brief Narrative: Patient is a 74 year old with a history of stroke, DM2, HTN, and MI who was discharged June24,2021 following a stroke work-up and return to the hospital 03/08/2020 with 2 days of intractable nausea and vomiting along with altered mental status. On presentation,She was found to be have  lactic acidosis and acute kidney injury. Following admission the patient improved with supportive care. Due to persisting right-sided weakness which was felt to be worse than previously documented, an MRI was obtained  on 7/18 and showed  an acute left pontine infarct and acute small infarct on the posterior body of the right lateral ventricle. Neurology was following, now signed off.  Stroke work-up completed.  She is still on tube feeding because of severe dysphagia.  Plan is to discharge her to LTAC versus skilled nursing facility.SW closely following.  Since her overall status has not significantly improved, we have requested palliative care for goals of care discussion/potential hospice consideration.  Assessment & Plan:   Active Problems:   CVA (cerebral vascular accident) (HCC)   DM (diabetes mellitus), type 2 with peripheral vascular complications (HCC)   Essential (primary) hypertension   Lactic acidosis   Nausea and vomiting   AKI (acute kidney injury) (HCC)   Hyperkalemia   Sepsis (HCC)   Coffee ground emesis  Acute CVA  On 7/18 with history of recent CVA June 2021 MRI as above Patient was on clopidogrel 75 mg dailyprior to admission, now on aspirin 81 mg daily and clopidogrel 75 mg daily.Plan is to continue DAPT x 3 weeks again then plavix alone She is also on crestor  Acute kidney injury Most consistent with prerenal state, w/ probable component of ATN Bun/cr on admission at 149/9.5, improving nicely  Nephrology signed off, nephrology will set up follow-up  appointment with Dr. Malen Gauze at [facility] 2 weeks after discharge  Hypernatremia Resolved.  Continue free water  Acute toxic metabolic encephalopathy Due  lacunaracute CVA,on presentation uremia was also suspected to have contributed to AMS EEGunrevealing TSH normal-B12 and folic acid not deficient Ental status has not significantly improved.  She remains lethargic.  SIRS v/s Sepsis- POA Hypothermia, leukocytosis, and lactic acidosis present at admission-no clear source of infection identified-CXR at presentation with atelectasis only-CT abdomen pelvis without acute findings-UA not consistent with UTI and culture without growth-blood cultures no growth x2-was treated with broad-spectrum empiric antibiotic for 7-day course  Intractable nausea vomiting and coffee-ground emesis Hemoccult negative-probable Mallory-Weiss teardue to recurrent vomiting caused by uremia-all anticoagulants/antiplatelet meds held initially but resumed Plavix on 7/17 and aspirin on  7/19  Dysphagia NPO as per  SLP recommendations On tube feeding  Noninsulin-dependent type II DM 2, controlled, A1c 6.9 Home medication Metformin held Continue current insulin regimen.  Diabetic coordinator ollowing.  HTN lopressor to 100 mg twice a day, Norvasc 10 mg daily BP stable  NSVT, keep mag>2, k>4,   Depression: Family desires treatment ,on  low-dose Remeron,   Disposition: Continues to require defeating for feeding propose, unable to swallow.  In current circumstances, needs LTAC.  Social worker following  Goals of care: Had a long discussion with the son on 03/23/20.  Given her debility, poor quality of life, I offered him palliative care consultation discuss about goals of care.  Son is currently focused on placing her in [place] .  But since her overall status is not improving and she is dependent  on tube feeds, she might be a candidate for hospice.  If family do not want hospice then we  might consider PEG placement but this is challenging.Will be in touch with family  Nutrition Problem: Inadequate oral intake Etiology: lethargy/confusion      DVT prophylaxis:Heparin [place] Code Status: Full Family Communication: called son phone on 03/23/20 Status is: Inpatient  Remains inpatient appropriate because:Unsafe d/c plan   Dispo:  Patient From: Skilled Nursing Facility  Planned Disposition: Skilled Nursing Facility  Vs LTACH vs residential Hopsice  Expected discharge date: Not sure  Medically stable for discharge: yes    Consultants: Neurology  Procedures: Tube feeding placement Antimicrobials:  Anti-infectives (From admission, onward)   Start     Dose/Rate Route Frequency Ordered Stop   03/10/20 1200  cefTRIAXone (ROCEPHIN) 2 g in sodium chloride 0.9 % 100 mL IVPB        2 g 200 mL/hr over 30 Minutes Intravenous Every 24 hours 03/10/20 0924 03/14/20 1333   03/09/20 1000  ceFEPIme (MAXIPIME) 1 g in sodium chloride 0.9 % 100 mL IVPB  Status:  Discontinued        1 g 200 mL/hr over 30 Minutes Intravenous Every 24 hours 03/08/20 1219 03/10/20 0924   03/08/20 1219  vancomycin variable dose per unstable renal function (pharmacist dosing)  Status:  Discontinued         Does not apply See admin instructions 03/08/20 1219 03/10/20 0906   03/08/20 1100  vancomycin (VANCOCIN) IVPB 1000 mg/200 mL premix        1,000 mg 200 mL/hr over 60 Minutes Intravenous  Once 03/08/20 1046 03/08/20 1406   03/08/20 1100  ceFEPIme (MAXIPIME) 2 g in sodium chloride 0.9 % 100 mL IVPB        2 g 200 mL/hr over 30 Minutes Intravenous  Once 03/08/20 1046 03/08/20 1211      Subjective: Patient seen and examined at the bedside this morning.  Hemodynamically stable.  No changes from yesterday.  Lethargic  Objective: Vitals:   03/25/20 0213 03/25/20 0409 03/25/20 0846 03/25/20 1127  BP: (!) 120/46 (!) 111/48 (!) 143/50 (!) 153/65  Pulse: 66 61 84 79  Resp: 20 18 18 18   Temp: 98.9 F (37.2  C) 98.4 F (36.9 C) 98.2 F (36.8 C) 98.5 F (36.9 C)  TempSrc: Axillary Oral Axillary Axillary  SpO2: 95% 94% 96% 96%  Weight:      Height:        Intake/Output Summary (Last 24 hours) at 03/25/2020 1255 Last data filed at 03/25/2020 1200 Gross per 24 hour  Intake 4305 ml  Output 500 ml  Net 3805 ml   Filed Weights   03/16/20 0158 03/23/20 0343 03/24/20 0345  Weight: 60.5 kg 78.9 kg 80.3 kg    Examination:   General exam: Deconditioned, ill looking, lethargic HEENT: Feeding tube Respiratory system: No crackles or wheezes  cardiovascular system: S1 & S2 heard, RRR.  Gastrointestinal system: Abdomen is nondistended, soft and nontender.  Central nervous system:Not Alert and oriented Extremities: No edema, no clubbing ,no cyanosis,   Data Reviewed: I have personally reviewed following labs and imaging studies  CBC: Recent Labs  Lab 03/19/20 1240 03/20/20 0216 03/22/20 0306 03/23/20 0346  WBC 12.7* 11.6* 14.2* 12.4*  NEUTROABS 10.2* 9.1* 10.9* 9.6*  HGB 11.1* 10.4* 10.9* 9.9*  HCT 34.2* 31.5* 33.6* 31.4*  MCV 87.9 88.0 88.4 90.2  PLT 312 339 433* 365   Basic Metabolic Panel: Recent Labs  Lab 03/19/20  1240 03/19/20 1240 03/20/20 0216 03/20/20 0216 03/21/20 0415 03/22/20 0306 03/23/20 0346 03/24/20 0306 03/25/20 0233  NA 144   < > 146*   < > 145 146* 147* 148* 139  K 4.0   < > 3.8   < > 3.2* 3.9 3.5 3.5 3.3*  CL 108   < > 109   < > 109 110 111 111 104  CO2 22   < > 23   < > 23 22 23  21* 21*  GLUCOSE 225*   < > 231*   < > 219* 293* 237* 190* 180*  BUN 56*   < > 63*   < > 77* 92* 103* 124* 122*  CREATININE 2.25*   < > 2.25*   < > 2.14* 2.27* 2.18* 2.19* 2.07*  CALCIUM 9.8   < > 9.7   < > 9.4 9.5 9.3 9.6 9.0  MG 2.1  --  1.9  --  1.8 2.6*  --   --   --    < > = values in this interval not displayed.   GFR: Estimated Creatinine Clearance: 24.8 mL/min (A) (by C-G formula based on SCr of 2.07 mg/dL (H)). Liver Function Tests: Recent Labs  Lab  03/20/20 0216 03/21/20 0415 03/22/20 0306  AST 48* 29 60*  ALT 95* 91* 121*  ALKPHOS 57 56 54  BILITOT 0.6 0.5 0.6  PROT 6.8 6.8 6.8  ALBUMIN 2.6* 2.6* 2.6*   No results for input(s): LIPASE, AMYLASE in the last 168 hours. Recent Labs  Lab 03/22/20 0306  AMMONIA 18   Coagulation Profile: No results for input(s): INR, PROTIME in the last 168 hours. Cardiac Enzymes: No results for input(s): CKTOTAL, CKMB, CKMBINDEX, TROPONINI in the last 168 hours. BNP (last 3 results) No results for input(s): PROBNP in the last 8760 hours. HbA1C: No results for input(s): HGBA1C in the last 72 hours. CBG: Recent Labs  Lab 03/25/20 0036 03/25/20 0411 03/25/20 0851 03/25/20 1048 03/25/20 1125  GLUCAP 190* 159* 246* 258* 311*   Lipid Profile: No results for input(s): CHOL, HDL, LDLCALC, TRIG, CHOLHDL, LDLDIRECT in the last 72 hours. Thyroid Function Tests: No results for input(s): TSH, T4TOTAL, FREET4, T3FREE, THYROIDAB in the last 72 hours. Anemia Panel: No results for input(s): VITAMINB12, FOLATE, FERRITIN, TIBC, IRON, RETICCTPCT in the last 72 hours. Sepsis Labs: No results for input(s): PROCALCITON, LATICACIDVEN in the last 168 hours.  No results found for this or any previous visit (from the past 240 hour(s)).       Radiology Studies: No results found.      Scheduled Meds: . amLODipine  10 mg Per Tube Daily  . aspirin  81 mg Per Tube Daily  . chlorhexidine  15 mL Mouth Rinse BID  . Chlorhexidine Gluconate Cloth  6 each Topical Daily  . clopidogrel  75 mg Per Tube Daily  . feeding supplement (PROSource TF)  45 mL Per Tube Daily  . free water  400 mL Per Tube Q6H  . heparin injection (subcutaneous)  5,000 Units Subcutaneous Q8H  . hydrALAZINE  50 mg Per Tube Q8H  . insulin aspart  0-15 Units Subcutaneous TID WC  . insulin aspart  5 Units Subcutaneous Q4H  . insulin detemir  10 Units Subcutaneous BID  . mouth rinse  15 mL Mouth Rinse BID  . metoprolol tartrate  100  mg Per Tube BID  . mirtazapine  7.5 mg Per Tube QHS  . pantoprazole sodium  40 mg Per Tube Daily  .  Ensure Max Protein  11 oz Oral BID  . rosuvastatin  5 mg Per Tube Daily   Continuous Infusions: . feeding supplement (GLUCERNA 1.2 CAL) 1,000 mL (03/24/20 1234)     LOS: 17 days    Time spent:35 mins. More than 50% of that time was spent in counseling and/or coordination of care.      Burnadette PopAmrit Ramere Downs, MD [facility] P7/29/2021, 12:55 PM

## 2020-03-25 NOTE — Progress Notes (Signed)
Inpatient Diabetes Program Recommendations  AACE/ADA: New Consensus Statement on Inpatient Glycemic Control (2015)  Target Ranges:  Prepandial:   less than 140 mg/dL      Peak postprandial:   less than 180 mg/dL (1-2 hours)      Critically ill patients:  140 - 180 mg/dL   Lab Results  Component Value Date   GLUCAP 311 (H) 03/25/2020   HGBA1C 6.9 (H) 02/15/2020    Review of Glycemic Control Results for AMEL, KITCH (MRN [id number]) as of 03/25/2020 13:21  Ref. Range 03/25/2020 08:51 03/25/2020 10:48 03/25/2020 11:25  Glucose-Capillary Latest Ref Range: 70 - 99 mg/dL 583 (H) 094 (H) 076 (H)    Inpatient Diabetes Program Recommendations:    If CBG's continue to be elevated please consider increasing correction to; Novolog 0-20 Q4H May need increased tube feed coverage as well  Will continue to follow while inpatient.  Thank you, Dulce Sellar, RN, BSN Diabetes Coordinator Inpatient Diabetes Program [phone] (team pager from 8a-5p)

## 2020-03-25 NOTE — Progress Notes (Signed)
Nutrition Follow-up  DOCUMENTATION CODES:   Not applicable  INTERVENTION:  D/c Ensure Max, pt NPO D/c Prosource TF   Via Cortrak: -Increase Glucerna 1.2 cal to 20m/hr (13210m -40062mree water Q6H (per MD)  Tube feeding will provide 1584 kcals, 79 grams protein, 1062m46mee water (2662ml88mal free water with flushes)   NUTRITION DIAGNOSIS:   Inadequate oral intake related to lethargy/confusion as evidenced by other (comment) (per RN report).  Ongoing  GOAL:   Patient will meet greater than or equal to 90% of their needs  Met with TF  MONITOR:   PO intake, Diet advancement, TF tolerance, Labs, Weight trends  REASON FOR ASSESSMENT:   Consult Enteral/tube feeding initiation and management  ASSESSMENT:   Pt with a history of stroke in June, HTN, MI and DM presented 7/12 with N/V and AMS. Pt admitted w/ uremia and possible infection. In hospital, R sided weakness not improving, MRI showed new acute infarcts.  7/15 NGT placed (gastric), TF initiated 7/16 NGT advanced to LOT 7/17 diet advanced to full liquids, TF reduced by MD 7/23 NGT replaced with Cortrak (gastric) 7/26 pt made NPO by SLP due to dysphagia  Pt tolerating TF per RN.  Per MD, pt's overall status has not significantly improved so palliative care has been consulted for GOC dNikolaevskussion/potential hospice consideration.   Current TF via Cortrak: Glucerna 1.2 cal @ 45ml/52m45ml P42murce TF daily, 400ml fr20mater Q6H (per MD)  Labs: K+ 3.3 (L) CBGs [phone] Coordinator Following) Medications: Novolog, Levemir, Remeron, Protonix, Ensure Max   Diet Order:   Diet Order    None      EDUCATION NEEDS:   No education needs have been identified at this time  Skin:  Skin Assessment: Skin Integrity Issues: Skin Integrity Issues:: Other (Comment) Other: MASD perineum  Last BM:  7/28  Height:   Ht Readings from Last 1 Encounters:  03/08/20 _0  (1.626 m)    Weight:   Wt  Readings from Last 1 Encounters:  03/24/20 80.3 kg    BMI:  Body mass index is 30.38 kg/m.  Estimated Nutritional Needs:   Kcal:  1500-1700 kcal  Protein:  75-85 grams  Fluid:  >/= 1.8 L/day    Aasia Peavler ALarkin Ina, LDN RD pager number and weekend/on-call pager number located in [email or web address]

## 2020-03-25 NOTE — Consult Note (Addendum)
Consultation Note Date: 03/25/2020   Patient Name: Tasha [name]  DOB: Feb 05, 1946  MRN: [id number]  Age / Sex: 74 y.o., female  PCP: Patient, No Pcp Per Referring Physician: Burnadette Pop, MD  Reason for Consultation: Establishing goals of care  HPI/Patient Profile: 74 y.o. 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  She was hypothermic and in acute renal failure.  During the hospitalization she was found to have new acute infarcts on brain MRI.  Patient is unable to eat and is being fed artificially thru a cor trak tube.  Per physical therapy notes she is a "total assist" and very lethargic.   On my visit this morning patient wakes with some prodding, but does not speak or make eye contact.  Clinical Assessment and Goals of Care:  I have reviewed medical records including EPIC notes, labs and imaging, received report from Dr. Renford Dills, examined the patient spoke on the phone with her son Calyn Sivils  to discuss diagnosis prognosis, GOC, EOL wishes, disposition and options.    Patient's recent brain imaging shows (1) extensive chronic ischemic change and (2) new acute left pontine infarct and new infarct along the right lateral ventricle.  I introduced Palliative Medicine as specialized medical care for people living with serious illness. It focuses on providing relief from the symptoms and stress of a serious illness.   We discussed a brief life review of the patient. She was a Engineer, civil (consulting).  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  She has two children [name] and his sister.  Her first stroke was in 2016, but she recovered and lived  independently until this hospitalization.  We discussed her current illness and what it means in the larger context of her on-going co-morbidities.  Natural disease trajectory and expectations at EOL were discussed.    I talked with him about continuing down the current path of aggressive interventions and LTAC vs considering Hospice and comfort measures.  I attempted to elicit values and goals of care important to the patient.  [name] states that his mother clearly said she wanted all measures taken to live as long as possible.  I talked with [name] about being bed bound and the gradual break down of the body -- UTIs, pressure ulcerations, pneumonia.  I asked if he knew what her reasoning was - why would she want to take all measures.  Brad felt that his mother is just not ready to leave yet.  He respects her wishes.     We talked further about her inability to make decisions now - the decisions about her care will fall to [place] and his sister.  Brad understands.    [name] talked a bit about the fact that  his mother has just a little money and he understands they will need to use it for her EOL care.  Unfortunately she did not name a Durable POA.  He is having a difficult time securing her money in order to support her.  He and his sister are working to have paperwork done in order to put their mother's affairs in order and be able to care for her financially.  Questions and concerns were addressed.  The family was encouraged to call with questions or concerns.        Primary Decision Maker:  NEXT OF KIN son and daughter.  [name] is in [place].  Daughter is in [place]  SUMMARY OF RECOMMENDATIONS    Full code full scope. Continue plan for LTAC PMT will follow at a distance.  Please call our office if we need to re-engage more immediately.  Code Status/Advance Care Planning:  Full   Prognosis:   Patient is at high risk for acute decline and death, but with continued aggressive medical  interventions she could live for months.  Discharge Planning: LTAC      Primary Diagnoses: Present on Admission: . DM (diabetes mellitus), type 2 with peripheral vascular complications (HCC) . CVA (cerebral vascular accident) (HCC) . Essential (primary) hypertension   I have reviewed the medical record, interviewed the patient and family, and examined the patient. The following aspects are pertinent.  Past Medical History:  Diagnosis Date  . Diabetes mellitus without complication (HCC)   . Essential (primary) hypertension   . Essential (primary) hypertension   . Essential (primary) hypertension   . History of falling   . Hypertension   . MI (myocardial infarction) (HCC)   . Stroke [facility])    Social History   Socioeconomic History  . Marital status: Divorced    Spouse name: Not on file  . Number of children: Not on file  . Years of education: Not on file  . Highest education level: Not on file  Occupational History  . Not on file  Tobacco Use  . Smoking status: Never Smoker  . Smokeless tobacco: Never Used  Vaping Use  . Vaping Use: Never used  Substance and Sexual Activity  . Alcohol use: Yes    Comment: occasionally  . Drug use: Never  . Sexual activity: Not on file  Other Topics Concern  . Not on file  Social History Narrative  . Not on file   Social Determinants of Health   Financial Resource Strain:   . Difficulty of Paying Living Expenses:   Food Insecurity:   . Worried About Programme researcher, broadcasting/film/video in the Last Year:   . Barista in the Last Year:   Transportation Needs:   . Freight forwarder (Medical):   Marland Kitchen Lack of Transportation (Non-Medical):   Physical Activity:   . Days of Exercise per Week:   . Minutes of Exercise per Session:   Stress:   . Feeling of Stress :   Social Connections:   . Frequency of Communication with Friends and Family:   . Frequency of Social Gatherings with Friends and Family:   . Attends Religious Services:   .  Active Member of Clubs or Organizations:   . Attends Banker Meetings:   Marland Kitchen Marital Status:    Family History  Adopted: Yes  Family history unknown: Yes    Allergies  Allergen Reactions  . Allopurinol Shortness Of Breath  . Tetracycline Anaphylaxis  . Amoxicillin-Pot Clavulanate  Diarrhea  . Atorvastatin Other (See Comments)    Unknown reaction - reported by [facility] [place] clinic 05/10/2012  . Azithromycin Other (See Comments)    Caused blisters inside and out  . Codeine Other (See Comments)    Altered mental status  . Lisinopril Other (See Comments)    Unknown reaction - reported by [facility] [place] clinic 12/23/2008 02/20/2020 she believes "reaction" was NP cough. No PMH of angioedema  . Sulfa Antibiotics Diarrhea      Vital Signs: BP (!) 153/65 (BP Location: Left Arm)   Pulse 79   Temp 98.5 F (36.9 C) (Axillary)   Resp 18   Ht 5\' 4"  (1.626 m)   Wt 80.3 kg   SpO2 96%   BMI 30.38 kg/m  Pain Scale: PAINAD POSS *See Group Information*: 1-Acceptable,Awake and alert Pain Score: Asleep   SpO2: SpO2: 96 % O2 Device:SpO2: 96 % O2 Flow Rate: .     Palliative Assessment/Data: 10%     Time In: 1:00 Time Out: 2:00 Time Total: 60 min. Visit consisted of counseling and education dealing with the complex and emotionally intense issues surrounding the need for palliative care and symptom management in the setting of serious and potentially life-threatening illness. Greater than 50%  of this time was spent counseling and coordinating care related to the above assessment and plan.  Signed by: , PA-C Palliative Medicine  Please contact Palliative Medicine Team phone at [phone] for questions and concerns.  For individual provider: See [phone]

## 2020-03-26 LAB — BASIC METABOLIC PANEL
Anion gap: 12 (ref 5–15)
BUN: 107 mg/dL — ABNORMAL HIGH (ref 8–23)
CO2: 22 mmol/L (ref 22–32)
Calcium: 9.2 mg/dL (ref 8.9–10.3)
Chloride: 105 mmol/L (ref 98–111)
Creatinine, Ser: 2.07 mg/dL — ABNORMAL HIGH (ref 0.44–1.00)
GFR calc Af Amer: 27 mL/min — ABNORMAL LOW (ref >60)
GFR calc non Af Amer: 23 mL/min — ABNORMAL LOW (ref >60)
Glucose, Bld: 198 mg/dL — ABNORMAL HIGH (ref 70–99)
Potassium: 3.8 mmol/L (ref 3.5–5.1)
Sodium: 139 mmol/L (ref 135–145)

## 2020-03-26 LAB — GLUCOSE, CAPILLARY
Glucose-Capillary: 181 mg/dL — ABNORMAL HIGH (ref 70–99)
Glucose-Capillary: 171 mg/dL — ABNORMAL HIGH (ref 70–99)
Glucose-Capillary: 155 mg/dL — ABNORMAL HIGH (ref 70–99)
Glucose-Capillary: 122 mg/dL — ABNORMAL HIGH (ref 70–99)
Glucose-Capillary: 110 mg/dL — ABNORMAL HIGH (ref 70–99)
Glucose-Capillary: 185 mg/dL — ABNORMAL HIGH (ref 70–99)

## 2020-03-26 NOTE — Progress Notes (Signed)
Physical Therapy Treatment Patient Details Name: Tasha [name] MRN: [id number] DOB: 02-Jan-1946 Today's Date: 03/26/2020    History of Present Illness Tasha [name] is a 74 y.o. female with history of stroke in June, HTN, MI and DM presenting 7/12 with nausea and vomiting, altered mental status, admitted w/ uremia and possible infection. In hospital, R sided weakness not improving, MRI showed new acute infarcts.     PT Comments    Pt with limited to no progress with mobility.  Noted that pt has received a pallative consult.  She continues to require total assist of 2 for transfers.  Sat EOB for 5 mins with mod-max A and facilitation techniques to promote sitting balance.  Performed ROM throughout to assist with circulation and maintenance of ROM/posture.  Cont POC as able in order to promote mobility and reduce caregiver burden as able.     Follow Up Recommendations  SNF (additionally noted pt has had pallative consult)     Equipment Recommendations  None recommended by PT    Recommendations for Other Services       Precautions / Restrictions Precautions Precautions: Fall Precaution Comments: right hemiplegia, coretrack    Mobility  Bed Mobility Overal bed mobility: Needs Assistance Bed Mobility: Rolling;Sidelying to Sit;Sit to Supine Rolling: +2 for physical assistance;+2 for safety/equipment;Total assist Sidelying to sit: Total assist;+2 for physical assistance   Sit to supine: Total assist;+2 for physical assistance   General bed mobility comments: Required total assist and use of bed pad to faciliate transfers; Rolled to reposition in L sidelying after session  Transfers                    Ambulation/Gait                 Stairs             Wheelchair Mobility    Modified Rankin (Stroke Patients Only) Modified Rankin (Stroke Patients Only) Pre-Morbid Rankin Score: Moderately severe disability Modified Rankin: Severe disability     Balance  Overall balance assessment: Needs assistance Sitting-balance support: No upper extremity supported;Feet supported Sitting balance-Leahy Scale: Zero Sitting balance - Comments: Pt extremely weak and with poor balance.  REquiring mod to max A at EOB.  Tending to lean R/L/or anterior.  Worked on weight shifting and WBing into elbows x 3 each side for 20-30 seconds at a time.  Assisted pt with neck/head ROM and looking up.  Sat EOB for ~ 5 mins     Standing balance-Leahy Scale: Zero Standing balance comment: unable                            Cognition Arousal/Alertness: Lethargic Behavior During Therapy: Flat affect Overall Cognitive Status: Difficult to assess                                 General Comments: Pt responded once to therapist calling her name by opening her eyes, otherwise could not visually attend or follow any commands to date      Exercises General Exercises - Lower Extremity Ankle Circles/Pumps: PROM;Both;5 reps;Supine Long Arc Quad: PROM;Both;Seated;5 reps Heel Slides: PROM;Both;10 reps;Supine Hip ABduction/ADduction: AROM;Both;10 reps;Supine Other Exercises Other Exercises: Hip IR/ER PROM x 10 Other Exercises: PROM Scapula protraction/retraction x 10 bil Other Exercises: Tends to hold neck R rotation and lateral flex; gently stretched to neutral position x 3 for  30 sec    General Comments General comments (skin integrity, edema, etc.): Pt on pressure relief bed, positioned in partial L sidelying.      Pertinent Vitals/Pain Pain Assessment: Faces Faces Pain Scale: Hurts little more Pain Location: grimacing with movement L side and neck Pain Descriptors / Indicators: Grimacing Pain Intervention(s): Limited activity within patient's tolerance;Monitored during session    Home Living                      Prior Function            PT Goals (current goals can now be found in the care plan section) Acute Rehab PT  Goals Patient Stated Goal: Unable to state PT Goal Formulation: Patient unable to participate in goal setting Time For Goal Achievement: 04/09/20 Potential to Achieve Goals: Poor Progress towards PT goals: Not progressing toward goals - comment (medical condition/severity limiting)    Frequency    Min 2X/week      PT Plan Current plan remains appropriate    Co-evaluation              AM-PAC PT "6 Clicks" Mobility   Outcome Measure  Help needed turning from your back to your side while in a flat bed without using bedrails?: Total Help needed moving from lying on your back to sitting on the side of a flat bed without using bedrails?: Total Help needed moving to and from a bed to a chair (including a wheelchair)?: Total Help needed standing up from a chair using your arms (e.g., wheelchair or bedside chair)?: Total Help needed to walk in hospital room?: Total Help needed climbing 3-5 steps with a railing? : Total 6 Click Score: 6    End of Session   Activity Tolerance: Patient tolerated treatment well Patient left: with call bell/phone within reach;in bed Nurse Communication: Mobility status;Need for lift equipment PT Visit Diagnosis: Hemiplegia and hemiparesis;Repeated falls (R29.6);Other symptoms and signs involving the nervous system (R29.898) Hemiplegia - Right/Left: Right Hemiplegia - dominant/non-dominant: Dominant Hemiplegia - caused by: Cerebral infarction     Time: 9983-3825 PT Time Calculation (min) (ACUTE ONLY): 15 min  Charges:  $Neuromuscular Re-education: 8-22 mins                     Anise Salvo, PT Acute Rehab Services Pager [phone] [facility] Rehab [phone]     Rayetta Humphrey 03/26/2020, 3:09 PM

## 2020-03-26 NOTE — Progress Notes (Signed)
  Speech Language Pathology Treatment: Dysphagia  Patient Details Name: Tasha [name] MRN: [id number] DOB: 1946-04-08 Today's Date: 03/26/2020 Time: 6073-7106 SLP Time Calculation (min) (ACUTE ONLY): 9 min  Assessment / Plan / Recommendation Clinical Impression  Pt with marginal improvements in LOA.  With optimal positioning and constant visual/tactile/verbal cueing her from her right side, she accepted ice chips and half teaspoon boluses of water with improved attention, active effort to manipulate ice, and the presence of a palpable though likely delayed swallow. There was poor labial seal and spillage of majority of bolus from right side.  She required prompts to stay awake and attend. There was wet phonation/weak cough present. Her MS is not sufficient to safely take POs.  Continue NPO with NG, vigilant oral care.  SLP will f/u early next week.    HPI HPI: Tasha [name] is a 74 y.o. female with history of stroke in June, HTN, MI and DM presenting 7/12 with nausea and vomiting, altered mental status, admitted w/ uremia and possible infection. In hospital, R sided weakness not improving, MRI showed acute left pontine infarction. Acute small infarct along the posterior body of the right lateral ventricle. Seen for swallowing during admission 02/17/20 >nectar thick then upgraded to thin/full liquids.       SLP Plan  Continue with current plan of care       Recommendations  Diet recommendations: NPO Medication Administration: Via alternative means                Oral Care Recommendations: Oral care QID Follow up Recommendations: Skilled Nursing facility;24 hour supervision/assistance SLP Visit Diagnosis: Dysphagia, unspecified (R13.10) Plan: Continue with current plan of care       GO               Tasha [name] L. Samson Frederic, MA CCC/SLP Acute Rehabilitation Services Office number [phone] Pager [phone]  Blenda Mounts Laurice 03/26/2020, 9:14 AM
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
## 2020-03-26 NOTE — Progress Notes (Signed)
PROGRESS NOTE    Tasha [name]  ASN:[id number] DOB: [birth year] DOA: 03/08/2020 PCP: Patient, No Pcp Per   Brief Narrative: Patient is a 74 year old with a history of stroke, DM2, HTN, and MI who was discharged June24,2021 following a stroke work-up and return to the hospital 03/08/2020 with 2 days of intractable nausea and vomiting along with altered mental status. On presentation,She was found to be have  lactic acidosis and acute kidney injury. Following admission the patient improved with supportive care. Due to persisting right-sided weakness which was felt to be worse than previously documented, an MRI was obtained  on 7/18 and showed  an acute left pontine infarct and acute small infarct on the posterior body of the right lateral ventricle. Neurology was following, now signed off.  Stroke work-up completed.  She is still on tube feeding because of severe dysphagia.  Plan is to discharge her to LTAC versus skilled nursing facility.SW closely following.  Plan is to place PEG by IR and discharge to SNF.  Assessment & Plan:   Active Problems:   CVA (cerebral vascular accident) (HCC)   DM (diabetes mellitus), type 2 with peripheral vascular complications (HCC)   Essential (primary) hypertension   Lactic acidosis   Nausea and vomiting   AKI (acute kidney injury) (HCC)   Hyperkalemia   Sepsis (HCC)   Coffee ground emesis   Palliative care encounter  Acute CVA  On 7/18 with history of recent CVA June 2021 MRI as above Patient was on clopidogrel 75 mg dailyprior to admission, now on aspirin 81 mg daily and clopidogrel 75 mg daily.Plan is to continue DAPT x 3 weeks again then plavix alone She is also on crestor  Acute kidney injury Most consistent with prerenal state, w/ probable component of ATN Bun/cr on admission at 149/9.5, improving nicely  Nephrology signed off, nephrology will set up follow-up appointment with Dr. Malen Gauze at [facility] 2 weeks after  discharge  Hypernatremia Resolved.  Continue free water  Acute toxic metabolic encephalopathy Due  lacunaracute CVA,on presentation uremia was also suspected to have contributed to AMS EEGunrevealing TSH normal-B12 and folic acid not deficient Mental status has not significantly improved.  She remains lethargic. Continues to remain feeding tube dependent so we are planning to place the PEG.  SIRS v/s Sepsis- POA Hypothermia, leukocytosis, and lactic acidosis present at admission-no clear source of infection identified-CXR at presentation with atelectasis only-CT abdomen pelvis without acute findings-UA not consistent with UTI and culture without growth-blood cultures no growth x2-was treated with broad-spectrum empiric antibiotic for 7-day course  Intractable nausea vomiting and coffee-ground emesis Hemoccult negative-probable Mallory-Weiss teardue to recurrent vomiting caused by uremia-all anticoagulants/antiplatelet meds held initially but resumed Plavix on 7/17 and aspirin on  7/19  Dysphagia NPO as per  SLP recommendations On tube feeding Continues to remain feeding tube dependent so we are planning to place the PEG.  Noninsulin-dependent type II DM 2, controlled, A1c 6.9 Home medication Metformin held Continue current insulin regimen.  Diabetic coordinator ollowing.  HTN lopressor to 100 mg twice a day, Norvasc 10 mg daily BP stable  NSVT, keep mag>2, k>4,   Depression: Family desires treatment ,on  low-dose Remeron,   Disposition: Continues to require defeating for feeding propose, unable to swallow.  In current circumstances, needs LTAC.  Social worker following  Goals of care: Had a long discussion with the son on 03/23/20.  Given her debility, poor quality of life, I offered him palliative care consultation discuss about goals of care.  Son is currently focused on placing her in [place] .  But since her overall status is not improving and she is  dependent on tube feeds, she might be a candidate for hospice.  His son wants to continue full scope of treatment.  Nutrition Problem: Inadequate oral intake Etiology: lethargy/confusion      DVT prophylaxis:Heparin [facility] Code Status: Full Family Communication: called son phone on 03/26/20 Status is: Inpatient  Remains inpatient appropriate because:Unsafe d/c plan   Dispo:  Patient From: Skilled Nursing Facility  Planned Disposition: SNF  Expected discharge date: Not sure  Medically stable for discharge: yes  Waiting for PEG tube placement by IR  Consultants: Neurology  Procedures: Tube feeding placement Antimicrobials:  Anti-infectives (From admission, onward)   Start     Dose/Rate Route Frequency Ordered Stop   03/10/20 1200  cefTRIAXone (ROCEPHIN) 2 g in sodium chloride 0.9 % 100 mL IVPB        2 g 200 mL/hr over 30 Minutes Intravenous Every 24 hours 03/10/20 0924 03/14/20 1333   03/09/20 1000  ceFEPIme (MAXIPIME) 1 g in sodium chloride 0.9 % 100 mL IVPB  Status:  Discontinued        1 g 200 mL/hr over 30 Minutes Intravenous Every 24 hours 03/08/20 1219 03/10/20 0924   03/08/20 1219  vancomycin variable dose per unstable renal function (pharmacist dosing)  Status:  Discontinued         Does not apply See admin instructions 03/08/20 1219 03/10/20 0906   03/08/20 1100  vancomycin (VANCOCIN) IVPB 1000 mg/200 mL premix        1,000 mg 200 mL/hr over 60 Minutes Intravenous  Once 03/08/20 1046 03/08/20 1406   03/08/20 1100  ceFEPIme (MAXIPIME) 2 g in sodium chloride 0.9 % 100 mL IVPB        2 g 200 mL/hr over 30 Minutes Intravenous  Once 03/08/20 1046 03/08/20 1211      Subjective: Patient seen and examined at the bedside this morning.  Hemodynamically stable.  No significant change from yesterday.  Remains lethargic, tube feeding dependent.  Objective: Vitals:   03/25/20 1929 03/25/20 2349 03/26/20 0353 03/26/20 0802  BP: (!) 136/53 (!) 122/60 (!) 127/52 (!) 127/50   Pulse: 80 69 73 85  Resp: 17 18 17 18   Temp: 99.1 F (37.3 C) 98.9 F (37.2 C) 98.2 F (36.8 C) 99 F (37.2 C)  TempSrc: Oral Oral Oral Oral  SpO2: 95% 94% 95% 96%  Weight:      Height:        Intake/Output Summary (Last 24 hours) at 03/26/2020 0902 Last data filed at 03/26/2020 0809 Gross per 24 hour  Intake 4761.58 ml  Output 2625 ml  Net 2136.58 ml   Filed Weights   03/16/20 0158 03/23/20 0343 03/24/20 0345  Weight: 60.5 kg 78.9 kg 80.3 kg    Examination:   General exam: Deconditioned, ill looking, lethargic HEENT: Feeding tube Respiratory system: Bilateral diminished air sounds on the bases, no crackles or wheezes  cardiovascular system: S1 & S2 heard, RRR.  Gastrointestinal system: Abdomen is nondistended, soft and nontender Central nervous system: Not Alert and oriented.  Extremities: No edema, no clubbing ,no cyanosis,    Data Reviewed: I have personally reviewed following labs and imaging studies  CBC: Recent Labs  Lab 03/19/20 1240 03/20/20 0216 03/22/20 0306 03/23/20 0346  WBC 12.7* 11.6* 14.2* 12.4*  NEUTROABS 10.2* 9.1* 10.9* 9.6*  HGB 11.1* 10.4* 10.9* 9.9*  HCT 34.2* 31.5* 33.6*  31.4*  MCV 87.9 88.0 88.4 90.2  PLT 312 339 433* 365   Basic Metabolic Panel: Recent Labs  Lab 03/19/20 1240 03/19/20 1240 03/20/20 0216 03/20/20 0216 03/21/20 0415 03/21/20 0415 03/22/20 0306 03/23/20 0346 03/24/20 0306 03/25/20 0233 03/26/20 0424  NA 144   < > 146*   < > 145   < > 146* 147* 148* 139 139  K 4.0   < > 3.8   < > 3.2*   < > 3.9 3.5 3.5 3.3* 3.8  CL 108   < > 109   < > 109   < > 110 111 111 104 105  CO2 22   < > 23   < > 23   < > 22 23 21* 21* 22  GLUCOSE 225*   < > 231*   < > 219*   < > 293* 237* 190* 180* 198*  BUN 56*   < > 63*   < > 77*   < > 92* 103* 124* 122* 107*  CREATININE 2.25*   < > 2.25*   < > 2.14*   < > 2.27* 2.18* 2.19* 2.07* 2.07*  CALCIUM 9.8   < > 9.7   < > 9.4   < > 9.5 9.3 9.6 9.0 9.2  MG 2.1  --  1.9  --  1.8  --  2.6*   --   --   --   --    < > = values in this interval not displayed.   GFR: Estimated Creatinine Clearance: 24.8 mL/min (A) (by C-G formula based on SCr of 2.07 mg/dL (H)). Liver Function Tests: Recent Labs  Lab 03/20/20 0216 03/21/20 0415 03/22/20 0306  AST 48* 29 60*  ALT 95* 91* 121*  ALKPHOS 57 56 54  BILITOT 0.6 0.5 0.6  PROT 6.8 6.8 6.8  ALBUMIN 2.6* 2.6* 2.6*   No results for input(s): LIPASE, AMYLASE in the last 168 hours. Recent Labs  Lab 03/22/20 0306  AMMONIA 18   Coagulation Profile: No results for input(s): INR, PROTIME in the last 168 hours. Cardiac Enzymes: No results for input(s): CKTOTAL, CKMB, CKMBINDEX, TROPONINI in the last 168 hours. BNP (last 3 results) No results for input(s): PROBNP in the last 8760 hours. HbA1C: No results for input(s): HGBA1C in the last 72 hours. CBG: Recent Labs  Lab 03/25/20 1630 03/25/20 2012 03/25/20 2349 03/26/20 0352 03/26/20 0753  GLUCAP 242* 173* 210* 181* 171*   Lipid Profile: No results for input(s): CHOL, HDL, LDLCALC, TRIG, CHOLHDL, LDLDIRECT in the last 72 hours. Thyroid Function Tests: No results for input(s): TSH, T4TOTAL, FREET4, T3FREE, THYROIDAB in the last 72 hours. Anemia Panel: No results for input(s): VITAMINB12, FOLATE, FERRITIN, TIBC, IRON, RETICCTPCT in the last 72 hours. Sepsis Labs: No results for input(s): PROCALCITON, LATICACIDVEN in the last 168 hours.  No results found for this or any previous visit (from the past 240 hour(s)).       Radiology Studies: No results found.      Scheduled Meds: . amLODipine  10 mg Per Tube Daily  . aspirin  81 mg Per Tube Daily  . chlorhexidine  15 mL Mouth Rinse BID  . Chlorhexidine Gluconate Cloth  6 each Topical Daily  . clopidogrel  75 mg Per Tube Daily  . free water  400 mL Per Tube Q6H  . heparin injection (subcutaneous)  5,000 Units Subcutaneous Q8H  . hydrALAZINE  50 mg Per Tube Q8H  . insulin aspart  0-15 Units  Subcutaneous TID WC  .  insulin aspart  5 Units Subcutaneous Q4H  . insulin detemir  10 Units Subcutaneous BID  . mouth rinse  15 mL Mouth Rinse BID  . metoprolol tartrate  100 mg Per Tube BID  . mirtazapine  7.5 mg Per Tube QHS  . pantoprazole sodium  40 mg Per Tube Daily  . rosuvastatin  5 mg Per Tube Daily   Continuous Infusions: . feeding supplement (GLUCERNA 1.2 CAL) 1,000 mL (03/26/20 0533)     LOS: 18 days    Time spent:35 mins. More than 50% of that time was spent in counseling and/or coordination of care.      Burnadette Pop, MD [facility] P7/30/2021, 9:02 AM

## 2020-03-27 DIAGNOSIS — I6389 Other cerebral infarction: Secondary | ICD-10-CM

## 2020-03-27 LAB — GLUCOSE, CAPILLARY
Glucose-Capillary: 153 mg/dL — ABNORMAL HIGH (ref 70–99)
Glucose-Capillary: 220 mg/dL — ABNORMAL HIGH (ref 70–99)
Glucose-Capillary: 216 mg/dL — ABNORMAL HIGH (ref 70–99)
Glucose-Capillary: 117 mg/dL — ABNORMAL HIGH (ref 70–99)
Glucose-Capillary: 84 mg/dL (ref 70–99)
Glucose-Capillary: 235 mg/dL — ABNORMAL HIGH (ref 70–99)

## 2020-03-27 NOTE — Progress Notes (Signed)
PROGRESS NOTE    Tasha [name]  MWN:[id number] DOB: 02-07-1946 DOA: 03/08/2020 PCP: Patient, No Pcp Per   Brief Narrative: Patient is a 74 year old with a history of stroke, DM2, HTN, and MI who was discharged June24,2021 following a stroke work-up and return to the hospital 03/08/2020 with 2 days of intractable nausea and vomiting along with altered mental status. On presentation,She was found to be have  lactic acidosis and acute kidney injury. Following admission the patient improved with supportive care. Due to persisting right-sided weakness which was felt to be worse than previously documented, an MRI was obtained  on 7/18 and showed  an acute left pontine infarct and acute small infarct on the posterior body of the right lateral ventricle. Neurology was following, now signed off.  Stroke work-up completed.  She is still on tube feeding because of severe dysphagia.  Plan is to discharge her to LTAC versus skilled nursing facility.SW closely following.  Plan is to place PEG by IR on wednesday and discharge to SNF.  Assessment & Plan:   Active Problems:   CVA (cerebral vascular accident) (HCC)   DM (diabetes mellitus), type 2 with peripheral vascular complications (HCC)   Essential (primary) hypertension   Lactic acidosis   Nausea and vomiting   AKI (acute kidney injury) (HCC)   Hyperkalemia   Sepsis (HCC)   Coffee ground emesis   Palliative care encounter  Acute CVA  On 7/18 with history of recent CVA June 2021 MRI as above Patient was on clopidogrel 75 mg dailyprior to admission, now on aspirin 81 mg daily and clopidogrel 75 mg daily.Plan is to continue DAPT x 3 weeks again then plavix alone. She is also on crestor.Plavix held for PEG  Acute kidney injury Most consistent with prerenal state, w/ probable component of ATN Bun/cr on admission at 149/9.5, improving nicely  Nephrology signed off, nephrology will set up follow-up appointment with Dr. Malen Gauze at [facility] 2  weeks after discharge  Hypernatremia Resolved.  Continue free water  Acute toxic metabolic encephalopathy Due  lacunaracute CVA,on presentation uremia was also suspected to have contributed to AMS EEGunrevealing TSH normal-B12 and folic acid not deficient Mental status has not significantly improved.  She remains lethargic. Continues to remain feeding tube dependent so we are planning to place the PEG.  SIRS v/s Sepsis- POA Hypothermia, leukocytosis, and lactic acidosis present at admission-no clear source of infection identified-CXR at presentation with atelectasis only-CT abdomen pelvis without acute findings-UA not consistent with UTI and culture without growth-blood cultures no growth x2-was treated with broad-spectrum empiric antibiotic for 7-day course  Intractable nausea vomiting and coffee-ground emesis Hemoccult negative-probable Mallory-Weiss teardue to recurrent vomiting caused by uremia-all anticoagulants/antiplatelet meds held initially but resumed Plavix on 7/17 and aspirin on  7/19  Dysphagia NPO as per  SLP recommendations On tube feeding Continues to remain feeding tube dependent so we are planning to place the PEG.  Noninsulin-dependent type II DM 2, controlled, A1c 6.9 Home medication Metformin held Continue current insulin regimen.  Diabetic coordinator ollowing.  HTN lopressor to 100 mg twice a day, Norvasc 10 mg daily BP stable  NSVT, keep mag>2, k>4,   Depression: Family desires treatment ,on  low-dose Remeron,   Disposition: Continues to require defeating for feeding propose, unable to swallow.  In current circumstances, needs LTAC.  Social worker following  Goals of care: Had a long discussion with the son on 03/23/20.  Given her debility, poor quality of life, I offered him palliative care consultation  discuss about goals of care.  Son is currently focused on placing her in [place] .  But since her overall status is not improving and  she is dependent on tube feeds, she might be a candidate for hospice.  But her  son wants to continue full scope of treatment.  Nutrition Problem: Inadequate oral intake Etiology: lethargy/confusion      DVT prophylaxis:Heparin [facility] Code Status: Full Family Communication: called son phone on 03/26/20 Status is: Inpatient  Remains inpatient appropriate because:Unsafe d/c plan   Dispo:  Patient From: Skilled Nursing Facility  Planned Disposition: SNF  Expected discharge date: Not sure  Medically stable for discharge: yes  Waiting for PEG tube placement by IR  Consultants: Neurology  Procedures: Tube feeding placement Antimicrobials:  Anti-infectives (From admission, onward)   Start     Dose/Rate Route Frequency Ordered Stop   03/10/20 1200  cefTRIAXone (ROCEPHIN) 2 g in sodium chloride 0.9 % 100 mL IVPB        2 g 200 mL/hr over 30 Minutes Intravenous Every 24 hours 03/10/20 0924 03/14/20 1333   03/09/20 1000  ceFEPIme (MAXIPIME) 1 g in sodium chloride 0.9 % 100 mL IVPB  Status:  Discontinued        1 g 200 mL/hr over 30 Minutes Intravenous Every 24 hours 03/08/20 1219 03/10/20 0924   03/08/20 1219  vancomycin variable dose per unstable renal function (pharmacist dosing)  Status:  Discontinued         Does not apply See admin instructions 03/08/20 1219 03/10/20 0906   03/08/20 1100  vancomycin (VANCOCIN) IVPB 1000 mg/200 mL premix        1,000 mg 200 mL/hr over 60 Minutes Intravenous  Once 03/08/20 1046 03/08/20 1406   03/08/20 1100  ceFEPIme (MAXIPIME) 2 g in sodium chloride 0.9 % 100 mL IVPB        2 g 200 mL/hr over 30 Minutes Intravenous  Once 03/08/20 1046 03/08/20 1211      Subjective: Patient seen and examined at the bedside this morning.  Hemodynamically stable.  No significant change from yesterday.  Being cleaned when I entered the room  Objective: Vitals:   03/26/20 1940 03/26/20 2317 03/27/20 0339 03/27/20 0805  BP: (!) 130/58 (!) 132/63 (!) 138/68 (!)  154/66  Pulse: 86 72 79 88  Resp: 18 17 18 21   Temp: 99.9 F (37.7 C) (!) 97.5 F (36.4 C) 99.7 F (37.6 C) (!) 100.4 F (38 C)  TempSrc: Oral Oral Oral Axillary  SpO2: 97% 95% 94% 95%  Weight:      Height:        Intake/Output Summary (Last 24 hours) at 03/27/2020 0832 Last data filed at 03/26/2020 1730 Gross per 24 hour  Intake 0 ml  Output 800 ml  Net -800 ml   Filed Weights   03/16/20 0158 03/23/20 0343 03/24/20 0345  Weight: 60.5 kg 78.9 kg 80.3 kg    Examination:   General exam: Deconditioned, ill looking, lethargic,weak,lying on bed with feeding tube  Data Reviewed: I have personally reviewed following labs and imaging studies  CBC: Recent Labs  Lab 03/22/20 0306 03/23/20 0346  WBC 14.2* 12.4*  NEUTROABS 10.9* 9.6*  HGB 10.9* 9.9*  HCT 33.6* 31.4*  MCV 88.4 90.2  PLT 433* 365   Basic Metabolic Panel: Recent Labs  Lab 03/21/20 0415 03/21/20 0415 03/22/20 0306 03/23/20 0346 03/24/20 0306 03/25/20 0233 03/26/20 0424  NA 145   < > 146* 147* 148* 139 139  K 3.2*   < > 3.9 3.5 3.5 3.3* 3.8  CL 109   < > 110 111 111 104 105  CO2 23   < > 22 23 21* 21* 22  GLUCOSE 219*   < > 293* 237* 190* 180* 198*  BUN 77*   < > 92* 103* 124* 122* 107*  CREATININE 2.14*   < > 2.27* 2.18* 2.19* 2.07* 2.07*  CALCIUM 9.4   < > 9.5 9.3 9.6 9.0 9.2  MG 1.8  --  2.6*  --   --   --   --    < > = values in this interval not displayed.   GFR: Estimated Creatinine Clearance: 24.8 mL/min (A) (by C-G formula based on SCr of 2.07 mg/dL (H)). Liver Function Tests: Recent Labs  Lab 03/21/20 0415 03/22/20 0306  AST 29 60*  ALT 91* 121*  ALKPHOS 56 54  BILITOT 0.5 0.6  PROT 6.8 6.8  ALBUMIN 2.6* 2.6*   No results for input(s): LIPASE, AMYLASE in the last 168 hours. Recent Labs  Lab 03/22/20 0306  AMMONIA 18   Coagulation Profile: No results for input(s): INR, PROTIME in the last 168 hours. Cardiac Enzymes: No results for input(s): CKTOTAL, CKMB, CKMBINDEX,  TROPONINI in the last 168 hours. BNP (last 3 results) No results for input(s): PROBNP in the last 8760 hours. HbA1C: No results for input(s): HGBA1C in the last 72 hours. CBG: Recent Labs  Lab 03/26/20 1621 03/26/20 1940 03/26/20 2315 03/27/20 0338 03/27/20 0810  GLUCAP 122* 110* 185* 153* 220*   Lipid Profile: No results for input(s): CHOL, HDL, LDLCALC, TRIG, CHOLHDL, LDLDIRECT in the last 72 hours. Thyroid Function Tests: No results for input(s): TSH, T4TOTAL, FREET4, T3FREE, THYROIDAB in the last 72 hours. Anemia Panel: No results for input(s): VITAMINB12, FOLATE, FERRITIN, TIBC, IRON, RETICCTPCT in the last 72 hours. Sepsis Labs: No results for input(s): PROCALCITON, LATICACIDVEN in the last 168 hours.  No results found for this or any previous visit (from the past 240 hour(s)).       Radiology Studies: No results found.      Scheduled Meds: . amLODipine  10 mg Per Tube Daily  . aspirin  81 mg Per Tube Daily  . chlorhexidine  15 mL Mouth Rinse BID  . Chlorhexidine Gluconate Cloth  6 each Topical Daily  . free water  400 mL Per Tube Q6H  . heparin injection (subcutaneous)  5,000 Units Subcutaneous Q8H  . hydrALAZINE  50 mg Per Tube Q8H  . insulin aspart  0-15 Units Subcutaneous TID WC  . insulin aspart  5 Units Subcutaneous Q4H  . insulin detemir  10 Units Subcutaneous BID  . mouth rinse  15 mL Mouth Rinse BID  . metoprolol tartrate  100 mg Per Tube BID  . mirtazapine  7.5 mg Per Tube QHS  . pantoprazole sodium  40 mg Per Tube Daily  . rosuvastatin  5 mg Per Tube Daily   Continuous Infusions: . feeding supplement (GLUCERNA 1.2 CAL) 1,000 mL (03/27/20 0443)     LOS: 19 days    Time spent:15 mins. More than 50% of that time was spent in counseling and/or coordination of care.      Burnadette Pop, MD [facility] P7/31/2021, 8:32 AM

## 2020-03-27 NOTE — Plan of Care (Signed)
  Problem: Education: Goal: Knowledge of General Education information will improve Description: Including pain rating scale, medication(s)/side effects and non-pharmacologic comfort measures Outcome: Progressing   Problem: Activity: Goal: Risk for activity intolerance will decrease Outcome: Progressing   Problem: Nutrition: Goal: Adequate nutrition will be maintained Outcome: Progressing   

## 2020-03-28 ENCOUNTER — Inpatient Hospital Stay (HOSPITAL_COMMUNITY): Payer: Medicare Other

## 2020-03-28 LAB — URINALYSIS, ROUTINE W REFLEX MICROSCOPIC
Bilirubin Urine: NEGATIVE
Glucose, UA: 150 mg/dL — AB
Ketones, ur: NEGATIVE mg/dL
Nitrite: NEGATIVE
Protein, ur: 30 mg/dL — AB
Specific Gravity, Urine: 1.015 (ref 1.005–1.030)
pH: 9 — ABNORMAL HIGH (ref 5.0–8.0)

## 2020-03-28 LAB — GLUCOSE, CAPILLARY
Glucose-Capillary: 110 mg/dL — ABNORMAL HIGH (ref 70–99)
Glucose-Capillary: 126 mg/dL — ABNORMAL HIGH (ref 70–99)
Glucose-Capillary: 128 mg/dL — ABNORMAL HIGH (ref 70–99)
Glucose-Capillary: 149 mg/dL — ABNORMAL HIGH (ref 70–99)
Glucose-Capillary: 151 mg/dL — ABNORMAL HIGH (ref 70–99)
Glucose-Capillary: 194 mg/dL — ABNORMAL HIGH (ref 70–99)

## 2020-03-28 IMAGING — DX DG CHEST 1V PORT
1 series · 1 of 1 positions shown · non-contrast
Comparison: [DATE] and earlier.

CLINICAL DATA: 73-year-old with fever and vomiting.

EXAM:
PORTABLE CHEST 1 VIEW

[chest ap]
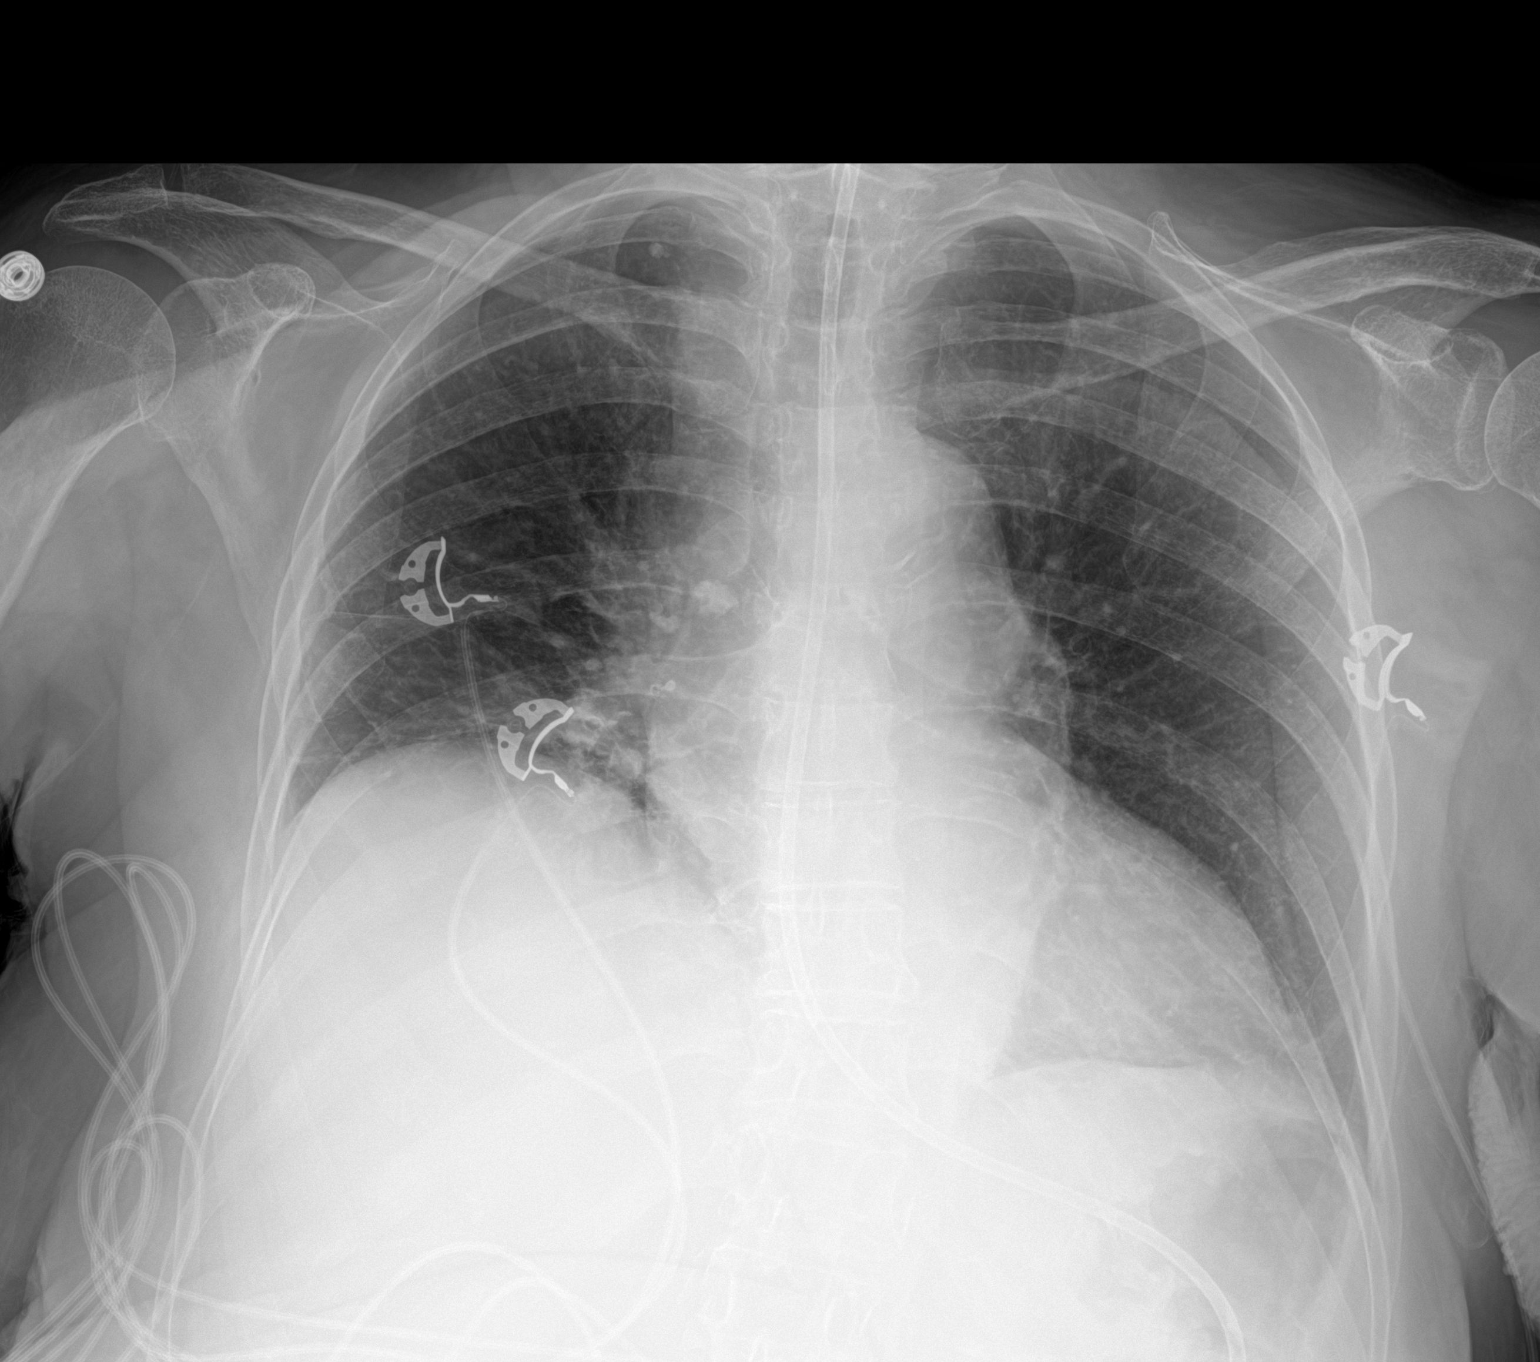

[1 of 1 positions shown; findings below may reference images not displayed]

FINDINGS: Cardiac silhouette normal in size, unchanged. Thoracic aorta
tortuous and atherosclerotic, unchanged. Chronic elevation of the
RIGHT hemidiaphragm with associated scar/atelectasis at the RIGHT
lung base. Calcified RIGHT hilar lymph nodes again noted as is the
calcified granuloma in the RIGHT lung apex. Lungs otherwise clear.
No localized airspace consolidation. No pleural effusions. No
pneumothorax. Normal pulmonary vascularity.
IMPRESSION: 1. No acute cardiopulmonary disease.
2. Chronic elevation of the RIGHT hemidiaphragm with associated
scar/atelectasis at the RIGHT lung base.
3. Old granulomatous disease.

## 2020-03-28 NOTE — Progress Notes (Signed)
PROGRESS NOTE    Tasha [name]  MWN:[id number] DOB: 02-07-1946 DOA: 03/08/2020 PCP: Patient, No Pcp Per   Brief Narrative: Patient is a 74 year old with a history of stroke, DM2, HTN, and MI who was discharged June24,2021 following a stroke work-up and return to the hospital 03/08/2020 with 2 days of intractable nausea and vomiting along with altered mental status. On presentation,She was found to be have  lactic acidosis and acute kidney injury. Following admission the patient improved with supportive care. Due to persisting right-sided weakness which was felt to be worse than previously documented, an MRI was obtained  on 7/18 and showed  an acute left pontine infarct and acute small infarct on the posterior body of the right lateral ventricle. Neurology was following, now signed off.  Stroke work-up completed.  She is still on tube feeding because of severe dysphagia.  Plan is to discharge her to LTAC versus skilled nursing facility.SW closely following.  Plan is to place PEG by IR on wednesday and discharge to SNF.  Assessment & Plan:   Active Problems:   CVA (cerebral vascular accident) (HCC)   DM (diabetes mellitus), type 2 with peripheral vascular complications (HCC)   Essential (primary) hypertension   Lactic acidosis   Nausea and vomiting   AKI (acute kidney injury) (HCC)   Hyperkalemia   Sepsis (HCC)   Coffee ground emesis   Palliative care encounter  Acute CVA  On 7/18 with history of recent CVA June 2021 MRI as above Patient was on clopidogrel 75 mg dailyprior to admission, now on aspirin 81 mg daily and clopidogrel 75 mg daily.Plan is to continue DAPT x 3 weeks again then plavix alone. She is also on crestor.Plavix held for PEG  Acute kidney injury Most consistent with prerenal state, w/ probable component of ATN Bun/cr on admission at 149/9.5, improving nicely  Nephrology signed off, nephrology will set up follow-up appointment with Dr. Malen Gauze at [facility] 2  weeks after discharge  Hypernatremia Resolved.  Continue free water  Acute toxic metabolic encephalopathy Due  lacunaracute CVA,on presentation uremia was also suspected to have contributed to AMS EEGunrevealing TSH normal-B12 and folic acid not deficient Mental status has not significantly improved.  She remains lethargic. Continues to remain feeding tube dependent so we are planning to place the PEG.  SIRS v/s Sepsis- POA Hypothermia, leukocytosis, and lactic acidosis present at admission-no clear source of infection identified-CXR at presentation with atelectasis only-CT abdomen pelvis without acute findings-UA not consistent with UTI and culture without growth-blood cultures no growth x2-was treated with broad-spectrum empiric antibiotic for 7-day course  Intractable nausea vomiting and coffee-ground emesis Hemoccult negative-probable Mallory-Weiss teardue to recurrent vomiting caused by uremia-all anticoagulants/antiplatelet meds held initially but resumed Plavix on 7/17 and aspirin on  7/19  Dysphagia NPO as per  SLP recommendations On tube feeding Continues to remain feeding tube dependent so we are planning to place the PEG.  Noninsulin-dependent type II DM 2, controlled, A1c 6.9 Home medication Metformin held Continue current insulin regimen.  Diabetic coordinator ollowing.  HTN lopressor to 100 mg twice a day, Norvasc 10 mg daily BP stable  NSVT, keep mag>2, k>4,   Depression: Family desires treatment ,on  low-dose Remeron,   Disposition: Continues to require defeating for feeding propose, unable to swallow.  In current circumstances, needs LTAC.  Social worker following  Goals of care: Had a long discussion with the son on 03/23/20.  Given her debility, poor quality of life, I offered him palliative care consultation  discuss about goals of care.  Son is currently focused on placing her in [place] .  But since her overall status is not improving and  she is dependent on tube feeds, she might be a candidate for hospice.  But her  son wants to continue full scope of treatment.  Fever: New problem.  She is having mild grade fever since yesterday.  We will check a UA, chest x-ray.  Will hold antibiotics for now.  Will check CBC   Nutrition Problem: Inadequate oral intake Etiology: lethargy/confusion      DVT prophylaxis:Heparin [place] Code Status: Full Family Communication: called son phone on 03/26/20 Status is: Inpatient  Remains inpatient appropriate because:Unsafe d/c plan   Dispo:  Patient From: Skilled Nursing Facility  Planned Disposition: SNF  Expected discharge date: Not sure  Medically stable for discharge: yes  Waiting for PEG tube placement by IR  Consultants: Neurology  Procedures: Tube feeding placement Antimicrobials:  Anti-infectives (From admission, onward)   Start     Dose/Rate Route Frequency Ordered Stop   03/10/20 1200  cefTRIAXone (ROCEPHIN) 2 g in sodium chloride 0.9 % 100 mL IVPB        2 g 200 mL/hr over 30 Minutes Intravenous Every 24 hours 03/10/20 0924 03/14/20 1333   03/09/20 1000  ceFEPIme (MAXIPIME) 1 g in sodium chloride 0.9 % 100 mL IVPB  Status:  Discontinued        1 g 200 mL/hr over 30 Minutes Intravenous Every 24 hours 03/08/20 1219 03/10/20 0924   03/08/20 1219  vancomycin variable dose per unstable renal function (pharmacist dosing)  Status:  Discontinued         Does not apply See admin instructions 03/08/20 1219 03/10/20 0906   03/08/20 1100  vancomycin (VANCOCIN) IVPB 1000 mg/200 mL premix        1,000 mg 200 mL/hr over 60 Minutes Intravenous  Once 03/08/20 1046 03/08/20 1406   03/08/20 1100  ceFEPIme (MAXIPIME) 2 g in sodium chloride 0.9 % 100 mL IVPB        2 g 200 mL/hr over 30 Minutes Intravenous  Once 03/08/20 1046 03/08/20 1211      Subjective: Patient seen and examined at bedside this morning.  Same as yesterday.  No new changes.  Remains lethargic,  bedbound  Objective: Vitals:   03/27/20 1941 03/27/20 2326 03/28/20 0418 03/28/20 0816  BP: (!) 145/54 (!) 113/46 (!) 124/58 (!) 131/51  Pulse: 90 75 80 81  Resp: 18 17 18 18   Temp: 99.2 F (37.3 C) 98 F (36.7 C) 98.9 F (37.2 C) (!) 100.7 F (38.2 C)  TempSrc: Oral  Oral Axillary  SpO2: 97% 93% 97% 93%  Weight:      Height:        Intake/Output Summary (Last 24 hours) at 03/28/2020 0840 Last data filed at 03/27/2020 1828 Gross per 24 hour  Intake 2505 ml  Output 1450 ml  Net 1055 ml   Filed Weights   03/16/20 0158 03/23/20 0343 03/24/20 0345  Weight: 60.5 kg 78.9 kg 80.3 kg    Examination:    General exam: Deconditioned, debilitated, ill looking, weak lying on the bed  HEENT: Feeding tube Respiratory system: No Wheezes or crackles  cardiovascular system: S1 & S2 heard, RRR. No JVD, murmurs, rubs, gallops or clicks. Gastrointestinal system: Abdomen is nondistended, soft and nontender. No organomegaly or masses felt. Normal bowel sounds heard. Central nervous system: Not alert and oriented. Extremities: No edema, no clubbing ,no cyanosis  GU: Foley   Data Reviewed: I have personally reviewed following labs and imaging studies  CBC: Recent Labs  Lab 03/22/20 0306 03/23/20 0346  WBC 14.2* 12.4*  NEUTROABS 10.9* 9.6*  HGB 10.9* 9.9*  HCT 33.6* 31.4*  MCV 88.4 90.2  PLT 433* 365   Basic Metabolic Panel: Recent Labs  Lab 03/22/20 0306 03/23/20 0346 03/24/20 0306 03/25/20 0233 03/26/20 0424  NA 146* 147* 148* 139 139  K 3.9 3.5 3.5 3.3* 3.8  CL 110 111 111 104 105  CO2 22 23 21* 21* 22  GLUCOSE 293* 237* 190* 180* 198*  BUN 92* 103* 124* 122* 107*  CREATININE 2.27* 2.18* 2.19* 2.07* 2.07*  CALCIUM 9.5 9.3 9.6 9.0 9.2  MG 2.6*  --   --   --   --    GFR: Estimated Creatinine Clearance: 24.8 mL/min (A) (by C-G formula based on SCr of 2.07 mg/dL (H)). Liver Function Tests: Recent Labs  Lab 03/22/20 0306  AST 60*  ALT 121*  ALKPHOS 54  BILITOT  0.6  PROT 6.8  ALBUMIN 2.6*   No results for input(s): LIPASE, AMYLASE in the last 168 hours. Recent Labs  Lab 03/22/20 0306  AMMONIA 18   Coagulation Profile: No results for input(s): INR, PROTIME in the last 168 hours. Cardiac Enzymes: No results for input(s): CKTOTAL, CKMB, CKMBINDEX, TROPONINI in the last 168 hours. BNP (last 3 results) No results for input(s): PROBNP in the last 8760 hours. HbA1C: No results for input(s): HGBA1C in the last 72 hours. CBG: Recent Labs  Lab 03/27/20 1628 03/27/20 1940 03/27/20 2324 03/28/20 0416 03/28/20 0726  GLUCAP 117* 84 235* 151* 149*   Lipid Profile: No results for input(s): CHOL, HDL, LDLCALC, TRIG, CHOLHDL, LDLDIRECT in the last 72 hours. Thyroid Function Tests: No results for input(s): TSH, T4TOTAL, FREET4, T3FREE, THYROIDAB in the last 72 hours. Anemia Panel: No results for input(s): VITAMINB12, FOLATE, FERRITIN, TIBC, IRON, RETICCTPCT in the last 72 hours. Sepsis Labs: No results for input(s): PROCALCITON, LATICACIDVEN in the last 168 hours.  No results found for this or any previous visit (from the past 240 hour(s)).       Radiology Studies: No results found.      Scheduled Meds: . amLODipine  10 mg Per Tube Daily  . aspirin  81 mg Per Tube Daily  . chlorhexidine  15 mL Mouth Rinse BID  . Chlorhexidine Gluconate Cloth  6 each Topical Daily  . free water  400 mL Per Tube Q6H  . heparin injection (subcutaneous)  5,000 Units Subcutaneous Q8H  . hydrALAZINE  50 mg Per Tube Q8H  . insulin aspart  0-15 Units Subcutaneous TID WC  . insulin aspart  5 Units Subcutaneous Q4H  . insulin detemir  10 Units Subcutaneous BID  . mouth rinse  15 mL Mouth Rinse BID  . metoprolol tartrate  100 mg Per Tube BID  . mirtazapine  7.5 mg Per Tube QHS  . pantoprazole sodium  40 mg Per Tube Daily  . rosuvastatin  5 mg Per Tube Daily   Continuous Infusions: . feeding supplement (GLUCERNA 1.2 CAL) 1,000 mL (03/28/20 0330)      LOS: 20 days    Time spent:15 mins. More than 50% of that time was spent in counseling and/or coordination of care.      Burnadette Pop, MD [facility] P8/08/2019, 8:40 AM

## 2020-03-28 NOTE — Plan of Care (Signed)
  Problem: Education: Goal: Knowledge of General Education information will improve Description: Including pain rating scale, medication(s)/side effects and non-pharmacologic comfort measures Outcome: Progressing   Problem: Nutrition: Goal: Adequate nutrition will be maintained Outcome: Progressing   Problem: Activity: Goal: Risk for activity intolerance will decrease Outcome: Progressing   

## 2020-03-29 LAB — CBC WITH DIFFERENTIAL/PLATELET
Abs Immature Granulocytes: 0.27 10*3/uL — ABNORMAL HIGH (ref 0.00–0.07)
Basophils Absolute: 0.1 10*3/uL (ref 0.0–0.1)
Basophils Relative: 1 %
Eosinophils Absolute: 0.9 10*3/uL — ABNORMAL HIGH (ref 0.0–0.5)
Eosinophils Relative: 7 %
HCT: 28.1 % — ABNORMAL LOW (ref 36.0–46.0)
Hemoglobin: 9 g/dL — ABNORMAL LOW (ref 12.0–15.0)
Immature Granulocytes: 2 %
Lymphocytes Relative: 16 %
Lymphs Abs: 2 10*3/uL (ref 0.7–4.0)
MCH: 28.8 pg (ref 26.0–34.0)
MCHC: 32 g/dL (ref 30.0–36.0)
MCV: 89.8 fL (ref 80.0–100.0)
Monocytes Absolute: 0.6 10*3/uL (ref 0.1–1.0)
Monocytes Relative: 5 %
Neutro Abs: 8.8 10*3/uL — ABNORMAL HIGH (ref 1.7–7.7)
Neutrophils Relative %: 69 %
Platelets: 316 10*3/uL (ref 150–400)
RBC: 3.13 MIL/uL — ABNORMAL LOW (ref 3.87–5.11)
RDW: 15.5 % (ref 11.5–15.5)
WBC: 12.6 10*3/uL — ABNORMAL HIGH (ref 4.0–10.5)
nRBC: 0 % (ref 0.0–0.2)

## 2020-03-29 LAB — GLUCOSE, CAPILLARY
Glucose-Capillary: 131 mg/dL — ABNORMAL HIGH (ref 70–99)
Glucose-Capillary: 154 mg/dL — ABNORMAL HIGH (ref 70–99)
Glucose-Capillary: 154 mg/dL — ABNORMAL HIGH (ref 70–99)
Glucose-Capillary: 218 mg/dL — ABNORMAL HIGH (ref 70–99)
Glucose-Capillary: 128 mg/dL — ABNORMAL HIGH (ref 70–99)
Glucose-Capillary: 89 mg/dL (ref 70–99)

## 2020-03-29 LAB — BASIC METABOLIC PANEL
Anion gap: 8 (ref 5–15)
BUN: 61 mg/dL — ABNORMAL HIGH (ref 8–23)
CO2: 23 mmol/L (ref 22–32)
Calcium: 9.1 mg/dL (ref 8.9–10.3)
Chloride: 107 mmol/L (ref 98–111)
Creatinine, Ser: 1.52 mg/dL — ABNORMAL HIGH (ref 0.44–1.00)
GFR calc Af Amer: 39 mL/min — ABNORMAL LOW (ref >60)
GFR calc non Af Amer: 34 mL/min — ABNORMAL LOW (ref >60)
Glucose, Bld: 143 mg/dL — ABNORMAL HIGH (ref 70–99)
Potassium: 4.1 mmol/L (ref 3.5–5.1)
Sodium: 138 mmol/L (ref 135–145)

## 2020-03-29 NOTE — Progress Notes (Addendum)
PROGRESS NOTE    Tasha [name]  MWN:[id number] DOB: 02-07-1946 DOA: 03/08/2020 PCP: Patient, No Pcp Per   Brief Narrative: Patient is a 74 year old with a history of stroke, DM2, HTN, and MI who was discharged June24,2021 following a stroke work-up and return to the hospital 03/08/2020 with 2 days of intractable nausea and vomiting along with altered mental status. On presentation,She was found to be have  lactic acidosis and acute kidney injury. Following admission the patient improved with supportive care. Due to persisting right-sided weakness which was felt to be worse than previously documented, an MRI was obtained  on 7/18 and showed  an acute left pontine infarct and acute small infarct on the posterior body of the right lateral ventricle. Neurology was following, now signed off.  Stroke work-up completed.  She is still on tube feeding because of severe dysphagia.  Plan is to discharge her to LTAC versus skilled nursing facility.SW closely following.  Plan is to place PEG by IR on wednesday and discharge to SNF.  Assessment & Plan:   Active Problems:   CVA (cerebral vascular accident) (HCC)   DM (diabetes mellitus), type 2 with peripheral vascular complications (HCC)   Essential (primary) hypertension   Lactic acidosis   Nausea and vomiting   AKI (acute kidney injury) (HCC)   Hyperkalemia   Sepsis (HCC)   Coffee ground emesis   Palliative care encounter  Acute CVA  On 7/18 with history of recent CVA June 2021 MRI as above Patient was on clopidogrel 75 mg dailyprior to admission, now on aspirin 81 mg daily and clopidogrel 75 mg daily.Plan is to continue DAPT x 3 weeks again then plavix alone. She is also on crestor.Plavix held for PEG  Acute kidney injury Most consistent with prerenal state, w/ probable component of ATN Bun/cr on admission at 149/9.5, improving nicely  Nephrology signed off, nephrology will set up follow-up appointment with Dr. Malen Gauze at [facility] 2  weeks after discharge  Hypernatremia Resolved.  Continue free water  Acute toxic metabolic encephalopathy Due  lacunaracute CVA,on presentation uremia was also suspected to have contributed to AMS EEGunrevealing TSH normal-B12 and folic acid not deficient Mental status has not significantly improved.  She remains lethargic. Continues to remain feeding tube dependent so we are planning to place the PEG.  SIRS v/s Sepsis- POA Hypothermia, leukocytosis, and lactic acidosis present at admission-no clear source of infection identified-CXR at presentation with atelectasis only-CT abdomen pelvis without acute findings-UA not consistent with UTI and culture without growth-blood cultures no growth x2-was treated with broad-spectrum empiric antibiotic for 7-day course  Intractable nausea vomiting and coffee-ground emesis Hemoccult negative-probable Mallory-Weiss teardue to recurrent vomiting caused by uremia-all anticoagulants/antiplatelet meds held initially but resumed Plavix on 7/17 and aspirin on  7/19  Dysphagia NPO as per  SLP recommendations On tube feeding Continues to remain feeding tube dependent so we are planning to place the PEG.  Noninsulin-dependent type II DM 2, controlled, A1c 6.9 Home medication Metformin held Continue current insulin regimen.  Diabetic coordinator ollowing.  HTN lopressor to 100 mg twice a day, Norvasc 10 mg daily BP stable  NSVT, keep mag>2, k>4,   Depression: Family desires treatment ,on  low-dose Remeron,   Disposition: Continues to require defeating for feeding propose, unable to swallow.  In current circumstances, needs LTAC.  Social worker following  Goals of care: Had a long discussion with the son on 03/23/20.  Given her debility, poor quality of life, I offered him palliative care consultation  discuss about goals of care.  Son is currently focused on placing her in [place] .  But since her overall status is not improving and  she is dependent on tube feeds, she might be a candidate for hospice.  But her  son wants to continue full scope of treatment.  Fever: resolved .  UA not impressive for UTI.  Chest x-ray did not show any pneumonia.  Nutrition Problem: Inadequate oral intake Etiology: lethargy/confusion      DVT prophylaxis:Heparin [place] Code Status: Full Family Communication: called son phone on 03/26/20 Status is: Inpatient  Remains inpatient appropriate because:Unsafe d/c plan   Dispo:  Patient From: Skilled Nursing Facility  Planned Disposition: SNF  Expected discharge date: Not sure  Medically stable for discharge: yes  Waiting for PEG tube placement by IR  Consultants: Neurology  Procedures: Tube feeding placement Antimicrobials:  Anti-infectives (From admission, onward)   Start     Dose/Rate Route Frequency Ordered Stop   03/10/20 1200  cefTRIAXone (ROCEPHIN) 2 g in sodium chloride 0.9 % 100 mL IVPB        2 g 200 mL/hr over 30 Minutes Intravenous Every 24 hours 03/10/20 0924 03/14/20 1333   03/09/20 1000  ceFEPIme (MAXIPIME) 1 g in sodium chloride 0.9 % 100 mL IVPB  Status:  Discontinued        1 g 200 mL/hr over 30 Minutes Intravenous Every 24 hours 03/08/20 1219 03/10/20 0924   03/08/20 1219  vancomycin variable dose per unstable renal function (pharmacist dosing)  Status:  Discontinued         Does not apply See admin instructions 03/08/20 1219 03/10/20 0906   03/08/20 1100  vancomycin (VANCOCIN) IVPB 1000 mg/200 mL premix        1,000 mg 200 mL/hr over 60 Minutes Intravenous  Once 03/08/20 1046 03/08/20 1406   03/08/20 1100  ceFEPIme (MAXIPIME) 2 g in sodium chloride 0.9 % 100 mL IVPB        2 g 200 mL/hr over 30 Minutes Intravenous  Once 03/08/20 1046 03/08/20 1211      Subjective: Patient seen and examined the bedside today.  Hemodynamically stable.  Same as yesterday, lethargic, lying on the bed .  Objective: Vitals:   03/28/20 1631 03/28/20 1952 03/28/20 2353 03/29/20  0348  BP: (!) 119/53 (!) 124/53 (!) 129/59 (!) 148/65  Pulse: 66 75 75 82  Resp: 16 18 17 18   Temp: 99.1 F (37.3 C) 99.6 F (37.6 C) 99.6 F (37.6 C) 99.5 F (37.5 C)  TempSrc: Oral Oral Oral Axillary  SpO2: 96% 95% 97% 95%  Weight:      Height:        Intake/Output Summary (Last 24 hours) at 03/29/2020 0825 Last data filed at 03/29/2020 0630 Gross per 24 hour  Intake 3082.5 ml  Output 1780 ml  Net 1302.5 ml   Filed Weights   03/16/20 0158 03/23/20 0343 03/24/20 0345  Weight: 60.5 kg 78.9 kg 80.3 kg    Examination:   General exam: Deconditioned, debilitated, chronically ill looking, weak HEENT: feeding tube Respiratory system: Bilateral equal air entry, normal vesicular breath sounds, no wheezes or crackles  Cardiovascular system: S1 & S2 heard, RRR. No JVD, murmurs, rubs, gallops or clicks. Gastrointestinal system: Abdomen is nondistended, soft and nontender. No organomegaly or masses felt. Normal bowel sounds heard. Central nervous system: Not Alert and oriented. Extremities: No edema, no clubbing ,no cyanosis Skin: No rashes, lesions or ulcers,no icterus ,no pallor GU : foley  Data Reviewed: I have personally reviewed following labs and imaging studies  CBC: Recent Labs  Lab 03/23/20 0346 03/29/20 0120  WBC 12.4* 12.6*  NEUTROABS 9.6* 8.8*  HGB 9.9* 9.0*  HCT 31.4* 28.1*  MCV 90.2 89.8  PLT 365 316   Basic Metabolic Panel: Recent Labs  Lab 03/23/20 0346 03/24/20 0306 03/25/20 0233 03/26/20 0424 03/29/20 0120  NA 147* 148* 139 139 138  K 3.5 3.5 3.3* 3.8 4.1  CL 111 111 104 105 107  CO2 23 21* 21* 22 23  GLUCOSE 237* 190* 180* 198* 143*  BUN 103* 124* 122* 107* 61*  CREATININE 2.18* 2.19* 2.07* 2.07* 1.52*  CALCIUM 9.3 9.6 9.0 9.2 9.1   GFR: Estimated Creatinine Clearance: 33.8 mL/min (A) (by C-G formula based on SCr of 1.52 mg/dL (H)). Liver Function Tests: No results for input(s): AST, ALT, ALKPHOS, BILITOT, PROT, ALBUMIN in the last 168  hours. No results for input(s): LIPASE, AMYLASE in the last 168 hours. No results for input(s): AMMONIA in the last 168 hours. Coagulation Profile: No results for input(s): INR, PROTIME in the last 168 hours. Cardiac Enzymes: No results for input(s): CKTOTAL, CKMB, CKMBINDEX, TROPONINI in the last 168 hours. BNP (last 3 results) No results for input(s): PROBNP in the last 8760 hours. HbA1C: No results for input(s): HGBA1C in the last 72 hours. CBG: Recent Labs  Lab 03/28/20 1140 03/28/20 1542 03/28/20 1950 03/28/20 2354 03/29/20 0346  GLUCAP 194* 126* 110* 128* 131*   Lipid Profile: No results for input(s): CHOL, HDL, LDLCALC, TRIG, CHOLHDL, LDLDIRECT in the last 72 hours. Thyroid Function Tests: No results for input(s): TSH, T4TOTAL, FREET4, T3FREE, THYROIDAB in the last 72 hours. Anemia Panel: No results for input(s): VITAMINB12, FOLATE, FERRITIN, TIBC, IRON, RETICCTPCT in the last 72 hours. Sepsis Labs: No results for input(s): PROCALCITON, LATICACIDVEN in the last 168 hours.  No results found for this or any previous visit (from the past 240 hour(s)).       Radiology Studies: DG CHEST PORT 1 VIEW  Result Date: 03/28/2020 CLINICAL DATA:  74 year old with fever and vomiting. EXAM: PORTABLE CHEST 1 VIEW COMPARISON:  03/09/2020 and earlier. FINDINGS: Cardiac silhouette normal in size, unchanged. Thoracic aorta tortuous and atherosclerotic, unchanged. Chronic elevation of the RIGHT hemidiaphragm with associated scar/atelectasis at the RIGHT lung base. Calcified RIGHT hilar lymph nodes again noted as is the calcified granuloma in the RIGHT lung apex. Lungs otherwise clear. No localized airspace consolidation. No pleural effusions. No pneumothorax. Normal pulmonary vascularity. IMPRESSION: 1. No acute cardiopulmonary disease. 2. Chronic elevation of the RIGHT hemidiaphragm with associated scar/atelectasis at the RIGHT lung base. 3. Old granulomatous disease. Electronically Signed    By: Hulan Saas M.D.   On: 03/28/2020 15:20        Scheduled Meds: . amLODipine  10 mg Per Tube Daily  . aspirin  81 mg Per Tube Daily  . chlorhexidine  15 mL Mouth Rinse BID  . Chlorhexidine Gluconate Cloth  6 each Topical Daily  . free water  400 mL Per Tube Q6H  . heparin injection (subcutaneous)  5,000 Units Subcutaneous Q8H  . hydrALAZINE  50 mg Per Tube Q8H  . insulin aspart  0-15 Units Subcutaneous TID WC  . insulin aspart  5 Units Subcutaneous Q4H  . insulin detemir  10 Units Subcutaneous BID  . mouth rinse  15 mL Mouth Rinse BID  . metoprolol tartrate  100 mg Per Tube BID  . mirtazapine  7.5 mg Per Tube QHS  .  pantoprazole sodium  40 mg Per Tube Daily  . rosuvastatin  5 mg Per Tube Daily   Continuous Infusions: . feeding supplement (GLUCERNA 1.2 CAL) 1,000 mL (03/29/20 0137)     LOS: 21 days    Time spent:15 mins. More than 50% of that time was spent in counseling and/or coordination of care.      Burnadette Pop, MD [facility] P8/09/2019, 8:25 AM

## 2020-03-29 NOTE — Progress Notes (Signed)
  Speech Language Pathology Treatment: Dysphagia  Patient Details Name: Tasha [name] MRN: [id number] DOB: Apr 09, 1946 Today's Date: 03/29/2020 Time: 9381-8299 SLP Time Calculation (min) (ACUTE ONLY): 10 min  Assessment / Plan / Recommendation Clinical Impression  Pt can be aroused with stimulation and had a few responses to simple yes/no questions. She opened her mouth a few times to command, but with minimal parting of her lips and keeping her dentition mostly clenched. SLP provided Max multimodal cues for pt to open her mouth to allow access for POs, as well as closing her lips to try to prevent anterior spillage. Pt has anterior loss of almost the entire bolus with each trial, despite cues and tactile assist. What remains in her mouth sits anteriorly and is removed with the yankauer. Continue to recommend that she remain NPO. Note that family has opted for PEG.     HPI HPI: Tasha [name] is a 74 y.o. female with history of stroke in June, HTN, MI and DM presenting 7/12 with nausea and vomiting, altered mental status, admitted w/ uremia and possible infection. In hospital, R sided weakness not improving, MRI showed acute left pontine infarction. Acute small infarct along the posterior body of the right lateral ventricle. Seen for swallowing during admission 02/17/20 >nectar thick then upgraded to thin/full liquids.       SLP Plan  Continue with current plan of care       Recommendations  Diet recommendations: NPO Medication Administration: Via alternative means                Oral Care Recommendations: Oral care QID Follow up Recommendations: Skilled Nursing facility;24 hour supervision/assistance SLP Visit Diagnosis: Dysphagia, unspecified (R13.10) Plan: Continue with current plan of care       GO                Mahala Menghini., M.A. CCC-SLP Acute Rehabilitation Services Pager [phone] Office [phone]  03/29/2020, 1:40 PM

## 2020-03-30 LAB — GLUCOSE, CAPILLARY
Glucose-Capillary: 121 mg/dL — ABNORMAL HIGH (ref 70–99)
Glucose-Capillary: 121 mg/dL — ABNORMAL HIGH (ref 70–99)
Glucose-Capillary: 124 mg/dL — ABNORMAL HIGH (ref 70–99)
Glucose-Capillary: 124 mg/dL — ABNORMAL HIGH (ref 70–99)
Glucose-Capillary: 136 mg/dL — ABNORMAL HIGH (ref 70–99)
Glucose-Capillary: 136 mg/dL — ABNORMAL HIGH (ref 70–99)
Glucose-Capillary: 136 mg/dL — ABNORMAL HIGH (ref 70–99)
Glucose-Capillary: 142 mg/dL — ABNORMAL HIGH (ref 70–99)
Glucose-Capillary: 190 mg/dL — ABNORMAL HIGH (ref 70–99)
Glucose-Capillary: 207 mg/dL — ABNORMAL HIGH (ref 70–99)

## 2020-03-30 NOTE — Progress Notes (Signed)
Nutrition Follow-up  DOCUMENTATION CODES:   Not applicable  INTERVENTION:  Continue via Cortrak: -Glucerna 1.2 cal @ 62m/hr (13257m -40080mree water Q6H (per MD)  Tube feeding will provide 1584 kcals, 79 grams protein, 1062m51mee water (2662ml52mal free water with flushes)  May continue with these tube feeding orders once PEG is placed and ready for use.   NUTRITION DIAGNOSIS:   Inadequate oral intake related to lethargy/confusion as evidenced by other (comment) (per RN report).  Ongoing  GOAL:   Patient will meet greater than or equal to 90% of their needs  Met with TF  MONITOR:   PO intake, Diet advancement, TF tolerance, Labs, Weight trends  REASON FOR ASSESSMENT:   Consult Enteral/tube feeding initiation and management  ASSESSMENT:   Pt with a history of stroke in June, HTN, MI and DM presented 7/12 with N/V and AMS. Pt admitted w/ uremia and possible infection. In hospital, R sided weakness not improving, MRI showed new acute infarcts.  7/15 NGT placed (gastric), TF initiated 7/16 NGT advanced to LOT 7/17 diet advanced to full liquids, TF reduced by MD 7/23 NGT replaced with Cortrak (gastric) 7/26 pt made NPO by SLP due to dysphagia  Palliative care was consulted; family elected to continue with full care. Per MD, plan is to place PEG in IR and discharge to SNF. PEG is to be placed tomorrow.   Current TF via Cortrak: Glucerna 1.2 cal @ 55ml/83m400ml f69mwater Q6H (per MD)  Labs: CBGs [phone] Coordinator Following) Medications: Novolog, Levemir, Remeron, Protonix  Diet Order:   Diet Order            Diet NPO time specified  Diet effective midnight                 EDUCATION NEEDS:   No education needs have been identified at this time  Skin:  Skin Assessment: Skin Integrity Issues: Skin Integrity Issues:: Other (Comment) Other: MASD perineum  Last BM:  8/3 type 7  Height:   Ht Readings from Last 1 Encounters:   03/08/20 '5\' 4"'$  (1.626 m)    Weight:   Wt Readings from Last 1 Encounters:  03/24/20 80.3 kg    BMI:  Body mass index is 30.38 kg/m.  Estimated Nutritional Needs:   Kcal:  1500-1700 kcal  Protein:  75-85 grams  Fluid:  >/= 1.8 L/day    Dago Jungwirth Larkin InaD, LDN RD pager number and weekend/on-call pager number located in [facility]

## 2020-03-30 NOTE — Plan of Care (Signed)
  Problem: Health Behavior/Discharge Planning: Goal: Ability to manage health-related needs will improve Outcome: Progressing   Problem: Clinical Measurements: Goal: Ability to maintain clinical measurements within normal limits will improve Outcome: Progressing Goal: Will remain free from infection Outcome: Progressing Goal: Diagnostic test results will improve Outcome: Progressing Goal: Respiratory complications will improve Outcome: Progressing Goal: Cardiovascular complication will be avoided Outcome: Progressing   Problem: Activity: Goal: Risk for activity intolerance will decrease Outcome: Progressing   Problem: Nutrition: Goal: Adequate nutrition will be maintained Outcome: Progressing   Problem: Coping: Goal: Level of anxiety will decrease Outcome: Progressing   Problem: Elimination: Goal: Will not experience complications related to bowel motility Outcome: Progressing Goal: Will not experience complications related to urinary retention Outcome: Progressing   Problem: Pain Managment: Goal: General experience of comfort will improve Outcome: Progressing   Problem: Safety: Goal: Ability to remain free from injury will improve Outcome: Progressing   Problem: Skin Integrity: Goal: Risk for impaired skin integrity will decrease Outcome: Progressing   Problem: Education: Goal: Knowledge of General Education information will improve Description: Including pain rating scale, medication(s)/side effects and non-pharmacologic comfort measures Outcome: Not Progressing Note: Patient remains confused.  Family aware.

## 2020-03-30 NOTE — Progress Notes (Signed)
Orthopedic Tech Progress Note Patient Details:  Tasha [name] Sep 06, 1945 [id number] Called in order to [facility] for BUE RESTING HAND SPLINTS Patient ID: Tasha [name], female   DOB: 06-08-1946, 74 y.o.   MRN: [id number]   [name] 03/30/2020, 3:45 PM

## 2020-03-30 NOTE — Progress Notes (Signed)
Occupational Therapy Treatment Patient Details Name: Tasha [name] MRN: [id number] DOB: [birth year] Today's Date: 03/30/2020    History of present illness Tasha [name] is a 74 y.o. female with history of stroke in June, HTN, MI and DM presenting 7/12 with nausea and vomiting, altered mental status, admitted w/ uremia and possible infection. In hospital, R sided weakness not improving, MRI showed new acute infarcts.    OT comments  Upon arrival pt lying in bed alert and responding to questions with one word answers. Pt agreeable to skilled-OT services. Pt requiring Total A - Total A +2 for bed mobility and therapeutic activity. Pt needing total A to sit EOB and complete weight shifting activity. Pt reporting pain with movement of neck and shoulders. Pt with significant neck rotation to the R side which inhibits her field of vision. Pt repositioned in chair position in bed with pillows positioned to decrease kyphotic posture and elevate BUEs. Increased digit composite flexor tone noted in BUEs. Ordered resting hand splints to decrease tone and encourage position for more functional hand use. Believe dc plans still remain appropriate for pt. Will continue to follow acutely.   Follow Up Recommendations  SNF    Equipment Recommendations  Other (comment) (TBD at next venue of care)       Precautions / Restrictions Precautions Precautions: Fall Precaution Comments: right hemiplegia, coretrack Restrictions Weight Bearing Restrictions: No       Mobility Bed Mobility Overal bed mobility: Needs Assistance Bed Mobility: Rolling;Sidelying to Sit;Sit to Supine Rolling: +2 for physical assistance;+2 for safety/equipment;Total assist Sidelying to sit: Total assist;+2 for physical assistance     Sit to sidelying: Total assist;+2 for physical assistance General bed mobility comments: pt required +2 total assist with bed mobility. Rolled to upright sitting position in bed. Total A to elevate trunk and  lower legs off bed  Transfers                 General transfer comment: deferred due to pt safety    Balance Overall balance assessment: Needs assistance Sitting-balance support: No upper extremity supported;Feet supported Sitting balance-Leahy Scale: Zero Sitting balance - Comments: Pt needing total A to sit upright at EOB. Pt unable to shift weight and push off elbows to assist in weight shifting Postural control: Posterior lean                                 ADL either performed or assessed with clinical judgement   ADL Overall ADL's : Needs assistance/impaired Eating/Feeding: Total assistance Eating/Feeding Details (indicate cue type and reason): has been receiving all nutrition via tube. cleared for liquids. Grooming: Total assistance Grooming Details (indicate cue type and reason): Total A to move BUEs                               General ADL Comments: total A for all ADLs at bed level..session focus on bed mobility and exercises due to increased lethargy     Vision   Vision Assessment?: Vision impaired- to be further tested in functional context Eye Alignment: Within Functional Limits Ocular Range of Motion: Within Functional Limits Alignment/Gaze Preference: Within Defined Limits Additional Comments: Pt favoring R sided vision due to R rotation in neck          Cognition Arousal/Alertness: Lethargic Behavior During Therapy: Flat affect Overall Cognitive Status: Difficult to assess Area  of Impairment: Attention;Memory;Following commands;Awareness                   Current Attention Level: Focused Memory: Decreased short-term memory Following Commands: Follows one step commands inconsistently   Awareness: Intellectual Problem Solving: Slow processing;Decreased initiation;Difficulty sequencing;Requires verbal cues;Requires tactile cues General Comments: Pt giving 1 word answers inconsistently to questions. Pt's daughter  present and confirming it is the most talkative she has been. Pt required multimodal cueing for simple commands.         Exercises Exercises: Other exercises Shoulder Exercises Neck Extension: PROM;20 reps;Seated Neck Lateral Flexion - Right: PROM;10 reps;Seated Neck Lateral Flexion - Left: PROM;10 reps;Seated Other Exercises Other Exercises: Scap retractions PROM x 10 Other Exercises: Lateral lean to R elbow, x3. manual facilitation at the pelvis to return to sitting EOB Other Exercises: Lateral lean to L elbow, x3. manual facilitation at the pelvis to return to sitting EOB      General Comments pt noted to have right sided gaze. Pt was hyper kyphotic at cervical and thoracic spine. limited neck ROM    Pertinent Vitals/ Pain       Pain Assessment: Faces Faces Pain Scale: Hurts even more Pain Location: grimacing and groaning with extremity and neck movement Pain Descriptors / Indicators: Grimacing;Discomfort;Moaning Pain Intervention(s): Limited activity within patient's tolerance;Repositioned         Frequency  Min 2X/week        Progress Toward Goals  OT Goals(current goals can now be found in the care plan section)  Progress towards OT goals: Not progressing toward goals - comment (pt requiring total A for all activities)  Acute Rehab OT Goals Patient Stated Goal: Unable to state  Plan Discharge plan remains appropriate    Co-evaluation    PT/OT/SLP Co-Evaluation/Treatment: Yes Reason for Co-Treatment: Complexity of the patient's impairments (multi-system involvement);For patient/therapist safety PT goals addressed during session: Balance;Strengthening/ROM OT goals addressed during session: Strengthening/ROM      AM-PAC OT "6 Clicks" Daily Activity     Outcome Measure   Help from another person eating meals?: A Little Help from another person taking care of personal grooming?: Total Help from another person toileting, which includes using toliet, bedpan,  or urinal?: Total Help from another person bathing (including washing, rinsing, drying)?: Total Help from another person to put on and taking off regular upper body clothing?: Total Help from another person to put on and taking off regular lower body clothing?: Total 6 Click Score: 8    End of Session    OT Visit Diagnosis: Unsteadiness on feet (R26.81);History of falling (Z91.81);Muscle weakness (generalized) (M62.81);Other symptoms and signs involving the nervous system (R29.898);Other symptoms and signs involving cognitive function;Cognitive communication deficit (R41.841);Other abnormalities of gait and mobility (R26.89)   Activity Tolerance Patient limited by lethargy   Patient Left in bed;with call bell/phone within reach;with bed alarm set   Nurse Communication Mobility status        Time: 6606-3016 OT Time Calculation (min): 24 min  Charges: OT General Charges $OT Visit: 1 Visit OT Treatments $Therapeutic Activity: 8-22 mins  Breasia Karges/OTS   Cartier Mapel 03/30/2020, 4:44 PM

## 2020-03-30 NOTE — Progress Notes (Signed)
PROGRESS NOTE    Tasha [name]  MWN:[id number] DOB: 02-07-1946 DOA: 03/08/2020 PCP: Patient, No Pcp Per   Brief Narrative: Patient is a 74 year old with a history of stroke, DM2, HTN, and MI who was discharged June24,2021 following a stroke work-up and return to the hospital 03/08/2020 with 2 days of intractable nausea and vomiting along with altered mental status. On presentation,She was found to be have  lactic acidosis and acute kidney injury. Following admission the patient improved with supportive care. Due to persisting right-sided weakness which was felt to be worse than previously documented, an MRI was obtained  on 7/18 and showed  an acute left pontine infarct and acute small infarct on the posterior body of the right lateral ventricle. Neurology was following, now signed off.  Stroke work-up completed.  She is still on tube feeding because of severe dysphagia.  Plan is to discharge her to LTAC versus skilled nursing facility.SW closely following.  Plan is to place PEG by IR on wednesday and discharge to SNF.  Assessment & Plan:   Active Problems:   CVA (cerebral vascular accident) (HCC)   DM (diabetes mellitus), type 2 with peripheral vascular complications (HCC)   Essential (primary) hypertension   Lactic acidosis   Nausea and vomiting   AKI (acute kidney injury) (HCC)   Hyperkalemia   Sepsis (HCC)   Coffee ground emesis   Palliative care encounter  Acute CVA  On 7/18 with history of recent CVA June 2021 MRI as above Patient was on clopidogrel 75 mg dailyprior to admission, now on aspirin 81 mg daily and clopidogrel 75 mg daily.Plan is to continue DAPT x 3 weeks again then plavix alone. She is also on crestor.Plavix held for PEG  Acute kidney injury Most consistent with prerenal state, w/ probable component of ATN Bun/cr on admission at 149/9.5, improving nicely  Nephrology signed off, nephrology will set up follow-up appointment with Dr. Malen Gauze at [facility] 2  weeks after discharge  Hypernatremia Resolved.  Continue free water  Acute toxic metabolic encephalopathy Due  lacunaracute CVA,on presentation uremia was also suspected to have contributed to AMS EEGunrevealing TSH normal-B12 and folic acid not deficient Mental status has not significantly improved.  She remains lethargic. Continues to remain feeding tube dependent so we are planning to place the PEG.  SIRS v/s Sepsis- POA Hypothermia, leukocytosis, and lactic acidosis present at admission-no clear source of infection identified-CXR at presentation with atelectasis only-CT abdomen pelvis without acute findings-UA not consistent with UTI and culture without growth-blood cultures no growth x2-was treated with broad-spectrum empiric antibiotic for 7-day course  Intractable nausea vomiting and coffee-ground emesis Hemoccult negative-probable Mallory-Weiss teardue to recurrent vomiting caused by uremia-all anticoagulants/antiplatelet meds held initially but resumed Plavix on 7/17 and aspirin on  7/19  Dysphagia NPO as per  SLP recommendations On tube feeding Continues to remain feeding tube dependent so we are planning to place the PEG.  Noninsulin-dependent type II DM 2, controlled, A1c 6.9 Home medication Metformin held Continue current insulin regimen.  Diabetic coordinator ollowing.  HTN lopressor to 100 mg twice a day, Norvasc 10 mg daily BP stable  NSVT, keep mag>2, k>4,   Depression: Family desires treatment ,on  low-dose Remeron,   Disposition: Continues to require defeating for feeding propose, unable to swallow.  In current circumstances, needs LTAC.  Social worker following  Goals of care: Had a long discussion with the son on 03/23/20.  Given her debility, poor quality of life, I offered him palliative care consultation  discuss about goals of care.  Son is currently focused on placing her in [place] .  But since her overall status is not improving and  she is dependent on tube feeds, she might be a candidate for hospice.  But her  son wants to continue full scope of treatment.  Fever: resolved .  UA not impressive for UTI.  Chest x-ray did not show any pneumonia.  Nutrition Problem: Inadequate oral intake Etiology: lethargy/confusion      DVT prophylaxis:Heparin [place] Code Status: Full Family Communication: Discussed with daughter-in-law at the bedside Status is: Inpatient  Remains inpatient appropriate because:Unsafe d/c plan   Dispo:  Patient From: Skilled Nursing Facility  Planned Disposition: SNF  Expected discharge date: Not sure  Medically stable for discharge: yes  Waiting for PEG tube placement by IR  Consultants: Neurology  Procedures: Tube feeding placement Antimicrobials:  Anti-infectives (From admission, onward)   Start     Dose/Rate Route Frequency Ordered Stop   03/10/20 1200  cefTRIAXone (ROCEPHIN) 2 g in sodium chloride 0.9 % 100 mL IVPB        2 g 200 mL/hr over 30 Minutes Intravenous Every 24 hours 03/10/20 0924 03/14/20 1333   03/09/20 1000  ceFEPIme (MAXIPIME) 1 g in sodium chloride 0.9 % 100 mL IVPB  Status:  Discontinued        1 g 200 mL/hr over 30 Minutes Intravenous Every 24 hours 03/08/20 1219 03/10/20 0924   03/08/20 1219  vancomycin variable dose per unstable renal function (pharmacist dosing)  Status:  Discontinued         Does not apply See admin instructions 03/08/20 1219 03/10/20 0906   03/08/20 1100  vancomycin (VANCOCIN) IVPB 1000 mg/200 mL premix        1,000 mg 200 mL/hr over 60 Minutes Intravenous  Once 03/08/20 1046 03/08/20 1406   03/08/20 1100  ceFEPIme (MAXIPIME) 2 g in sodium chloride 0.9 % 100 mL IVPB        2 g 200 mL/hr over 30 Minutes Intravenous  Once 03/08/20 1046 03/08/20 1211      Subjective:  Patient seen and examined the bedside today.  Hemodynamically stable.  No changes from yesterday.  Plan for PEG tomorrow.  Objective: Vitals:   03/29/20 1927 03/30/20 0009  03/30/20 0319 03/30/20 0730  BP: (!) 129/58 (!) 135/57 (!) 140/54 (!) 148/57  Pulse: 78 73 74 81  Resp: 17 17 17 16   Temp: 97.7 F (36.5 C) 99.5 F (37.5 C) 98.9 F (37.2 C) 98.4 F (36.9 C)  TempSrc: Axillary Oral Oral Axillary  SpO2: 96% 96% 96% 96%  Weight:      Height:        Intake/Output Summary (Last 24 hours) at 03/30/2020 0840 Last data filed at 03/30/2020 0550 Gross per 24 hour  Intake 2483.33 ml  Output 2000 ml  Net 483.33 ml   Filed Weights   03/16/20 0158 03/23/20 0343 03/24/20 0345  Weight: 60.5 kg 78.9 kg 80.3 kg    Examination:   General exam: Deconditioned, debilitated, chronically ill looking, weak HEENT: feeding tube Respiratory system: Bilateral equal air entry, normal vesicular breath sounds, no wheezes or crackles  Cardiovascular system: S1 & S2 heard, RRR. No JVD, murmurs, rubs, gallops or clicks. Gastrointestinal system: Abdomen is nondistended, soft and nontender. No organomegaly or masses felt. Normal bowel sounds heard. Central nervous system: Not Alert and oriented. Extremities: No edema, no clubbing ,no cyanosis Skin: No rashes, lesions or ulcers,no icterus ,no pallor GU :  foley  Data Reviewed: I have personally reviewed following labs and imaging studies  CBC: Recent Labs  Lab 03/29/20 0120  WBC 12.6*  NEUTROABS 8.8*  HGB 9.0*  HCT 28.1*  MCV 89.8  PLT 316   Basic Metabolic Panel: Recent Labs  Lab 03/24/20 0306 03/25/20 0233 03/26/20 0424 03/29/20 0120  NA 148* 139 139 138  K 3.5 3.3* 3.8 4.1  CL 111 104 105 107  CO2 21* 21* 22 23  GLUCOSE 190* 180* 198* 143*  BUN 124* 122* 107* 61*  CREATININE 2.19* 2.07* 2.07* 1.52*  CALCIUM 9.6 9.0 9.2 9.1   GFR: Estimated Creatinine Clearance: 33.8 mL/min (A) (by C-G formula based on SCr of 1.52 mg/dL (H)). Liver Function Tests: No results for input(s): AST, ALT, ALKPHOS, BILITOT, PROT, ALBUMIN in the last 168 hours. No results for input(s): LIPASE, AMYLASE in the last 168  hours. No results for input(s): AMMONIA in the last 168 hours. Coagulation Profile: No results for input(s): INR, PROTIME in the last 168 hours. Cardiac Enzymes: No results for input(s): CKTOTAL, CKMB, CKMBINDEX, TROPONINI in the last 168 hours. BNP (last 3 results) No results for input(s): PROBNP in the last 8760 hours. HbA1C: No results for input(s): HGBA1C in the last 72 hours. CBG: Recent Labs  Lab 03/29/20 1527 03/29/20 1923 03/30/20 0007 03/30/20 0315 03/30/20 0727  GLUCAP 128* 89 136* 124* 121*   Lipid Profile: No results for input(s): CHOL, HDL, LDLCALC, TRIG, CHOLHDL, LDLDIRECT in the last 72 hours. Thyroid Function Tests: No results for input(s): TSH, T4TOTAL, FREET4, T3FREE, THYROIDAB in the last 72 hours. Anemia Panel: No results for input(s): VITAMINB12, FOLATE, FERRITIN, TIBC, IRON, RETICCTPCT in the last 72 hours. Sepsis Labs: No results for input(s): PROCALCITON, LATICACIDVEN in the last 168 hours.  No results found for this or any previous visit (from the past 240 hour(s)).       Radiology Studies: DG CHEST PORT 1 VIEW  Result Date: 03/28/2020 CLINICAL DATA:  74 year old with fever and vomiting. EXAM: PORTABLE CHEST 1 VIEW COMPARISON:  03/09/2020 and earlier. FINDINGS: Cardiac silhouette normal in size, unchanged. Thoracic aorta tortuous and atherosclerotic, unchanged. Chronic elevation of the RIGHT hemidiaphragm with associated scar/atelectasis at the RIGHT lung base. Calcified RIGHT hilar lymph nodes again noted as is the calcified granuloma in the RIGHT lung apex. Lungs otherwise clear. No localized airspace consolidation. No pleural effusions. No pneumothorax. Normal pulmonary vascularity. IMPRESSION: 1. No acute cardiopulmonary disease. 2. Chronic elevation of the RIGHT hemidiaphragm with associated scar/atelectasis at the RIGHT lung base. 3. Old granulomatous disease. Electronically Signed   By: Hulan Saas M.D.   On: 03/28/2020 15:20         Scheduled Meds: . amLODipine  10 mg Per Tube Daily  . aspirin  81 mg Per Tube Daily  . chlorhexidine  15 mL Mouth Rinse BID  . Chlorhexidine Gluconate Cloth  6 each Topical Daily  . free water  400 mL Per Tube Q6H  . heparin injection (subcutaneous)  5,000 Units Subcutaneous Q8H  . hydrALAZINE  50 mg Per Tube Q8H  . insulin aspart  0-15 Units Subcutaneous TID WC  . insulin aspart  5 Units Subcutaneous Q4H  . insulin detemir  10 Units Subcutaneous BID  . mouth rinse  15 mL Mouth Rinse BID  . metoprolol tartrate  100 mg Per Tube BID  . mirtazapine  7.5 mg Per Tube QHS  . pantoprazole sodium  40 mg Per Tube Daily  . rosuvastatin  5 mg  Per Tube Daily   Continuous Infusions: . feeding supplement (GLUCERNA 1.2 CAL) 1,000 mL (03/30/20 0029)     LOS: 22 days    Time spent:15 mins. More than 50% of that time was spent in counseling and/or coordination of care.      Burnadette Pop, MD [facility] P8/10/2019, 8:40 AM

## 2020-03-30 NOTE — Progress Notes (Signed)
03/30/20 1546  PT Visit Information  Last PT Received On 03/30/20  Assistance Needed +2  PT/OT/SLP Co-Evaluation/Treatment Yes  Reason for Co-Treatment Complexity of the patient's impairments (multi-system involvement);For patient/therapist safety  PT goals addressed during session Balance;Strengthening/ROM  History of Present Illness Tasha [name] is a 74 y.o. 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 In hospital, R sided weakness not improving, MRI showed new acute infarcts of L pontine and post body R lateral ventricle  Subjective Data  Patient Stated Goal Unable to state  Precautions  Precautions Fall  Precaution Comments right hemiplegia  Restrictions  Weight Bearing Restrictions No  Pain Assessment  Pain Assessment Faces  Faces Pain Scale 6  Pain Location BLE and neck with movement  Pain Descriptors / Indicators Grimacing  Pain Intervention(s) Limited activity within patient's tolerance;Repositioned  Cognition  Arousal/Alertness Lethargic  Behavior During Therapy Flat affect  Overall Cognitive Status Difficult to assess  Area of Impairment Following commands;Attention;Memory;Awareness;Problem solving  Current Attention Level Focused  Memory Decreased short-term memory  Following Commands Follows one step commands inconsistently  Awareness Intellectual  Problem Solving Slow processing;Decreased initiation;Requires tactile cues;Requires verbal cues;Difficulty sequencing  General Comments Pt responded verbally to therapist questions but was inconsistent. Pt required multimodal cueing and couldn't follow simple one step commands. Pt unable to visually attend to the L side possibly due to inattention and decreased neck ROM  Difficult to assess due to Impaired communication  Bed Mobility  Overal bed mobility Needs Assistance  Bed Mobility  Rolling;Sidelying to Sit;Sit to Sidelying  Rolling Total assist;+2 for physical assistance  Sidelying to sit Total assist;+2 for physical assistance;HOB elevated  Sit to sidelying Total assist;+2 for physical assistance  General bed mobility comments pt required +2 total assist at BLE and trunk with bed mobility.  Modified Rankin (Stroke Patients Only)  Pre-Morbid Rankin Score 5  Modified Rankin 5  Balance  Overall balance assessment Needs assistance  Sitting-balance support No upper extremity supported;Feet supported  Sitting balance-Leahy Scale Zero  Sitting balance - Comments Pt extremely weak with poor balance. Pt requiring total assist at EOB. pt unable to shift weight and push off elbows. Pt required mutlimodal cues for positioning with wt shifting   Postural control Posterior lean  General Comments  General comments (skin integrity, edema, etc.) pt noted to have right sided gaze. Pt was hyper kyphotic at cervical and thoracic spine. limited neck ROM  Exercises  Exercises Other exercises  Other Exercises  Other Exercises Scap retractions for passive stretch x 10-15 second hold  Other Exercises Lateral lean to R elbow x 3 with 5 second holds. facilitation at trunk to return to sitting.  Other Exercises Lateral lean to L elbow, x3. facilitation at the trunk to return to sitting  Other Exercises Neck extension passive stretch x 30 second hold  Other Exercises Neck bilateral lateral flexion with passive stretch x 10 second holds each  PT - End of Session  Activity Tolerance Patient limited by lethargy  Patient left with call Oretta Berkland/phone within reach;in bed;with bed alarm set  Nurse Communication Mobility status   PT - Assessment/Plan  PT Plan Current plan remains appropriate  PT Visit Diagnosis Hemiplegia and hemiparesis;Repeated falls (R29.6);Other symptoms and signs involving the nervous system (R29.898);Muscle weakness (generalized) (M62.81)  Hemiplegia - Right/Left Right   Hemiplegia - dominant/non-dominant Dominant  Hemiplegia - caused by Cerebral infarction  PT Frequency (ACUTE ONLY) Min 2X/week  Follow Up Recommendations SNF  PT equipment None recommended by PT  AM-PAC PT "6 Clicks" Mobility Outcome Measure (Version 2)  Help needed turning from your back to your side while in a flat bed without using bedrails? 1  Help needed moving from lying on your back to sitting on the side of a flat bed without using bedrails? 1  Help needed moving to and from a bed to a chair (including a wheelchair)? 1  Help needed standing up from a chair using your arms (e.g., wheelchair or bedside chair)? 1  Help needed to walk in hospital room? 1  Help needed climbing 3-5 steps with a railing?  1  6 Click Score 6  Consider Recommendation of Discharge To: CIR/SNF/LTACH  PT Goal Progression  Progress towards PT goals Not progressing toward goals - comment (continuing to require +2 total assist)  Acute Rehab PT Goals  PT Goal Formulation Patient unable to participate in goal setting  Time For Goal Achievement 04/09/20  Potential to Achieve Goals Poor  PT Time Calculation  PT Start Time (ACUTE ONLY) 1508  PT Stop Time (ACUTE ONLY) 1531  PT Time Calculation (min) (ACUTE ONLY) 23 min  PT General Charges  $$ ACUTE PT VISIT 1 Visit  PT Treatments  $Therapeutic Activity 8-22 mins    Pt is progressing very slowly towards goals as pt continues to be total assist +2 and responding to very little cues. Pt was total assist for all bed mobility tasks. Pt was mostly non-verbal with session and required multimodal cueing. Given current deficits, current recommendations are appropriate. Will continue to follow acutely.   Harmon Pier, SPT  Acute Rehabilitation Services  Office: [phone]

## 2020-03-31 ENCOUNTER — Inpatient Hospital Stay (HOSPITAL_COMMUNITY): Payer: Medicare Other

## 2020-03-31 HISTORY — PX: IR GASTROSTOMY TUBE MOD SED: IMG625

## 2020-03-31 LAB — PROTIME-INR
INR: 1.1 (ref 0.8–1.2)
Prothrombin Time: 14.1 s (ref 11.4–15.2)

## 2020-03-31 LAB — BASIC METABOLIC PANEL
Anion gap: 11 (ref 5–15)
BUN: 49 mg/dL — ABNORMAL HIGH (ref 8–23)
CO2: 21 mmol/L — ABNORMAL LOW (ref 22–32)
Calcium: 9.2 mg/dL (ref 8.9–10.3)
Chloride: 103 mmol/L (ref 98–111)
Creatinine, Ser: 1.21 mg/dL — ABNORMAL HIGH (ref 0.44–1.00)
GFR calc Af Amer: 51 mL/min — ABNORMAL LOW (ref >60)
GFR calc non Af Amer: 44 mL/min — ABNORMAL LOW (ref >60)
Glucose, Bld: 100 mg/dL — ABNORMAL HIGH (ref 70–99)
Potassium: 4.1 mmol/L (ref 3.5–5.1)
Sodium: 135 mmol/L (ref 135–145)

## 2020-03-31 LAB — CBC WITH DIFFERENTIAL/PLATELET
Abs Immature Granulocytes: 0.32 K/uL — ABNORMAL HIGH (ref 0.00–0.07)
Basophils Absolute: 0.1 K/uL (ref 0.0–0.1)
Basophils Relative: 1 %
Eosinophils Absolute: 0.9 K/uL — ABNORMAL HIGH (ref 0.0–0.5)
Eosinophils Relative: 7 %
HCT: 28.5 % — ABNORMAL LOW (ref 36.0–46.0)
Hemoglobin: 9.3 g/dL — ABNORMAL LOW (ref 12.0–15.0)
Immature Granulocytes: 3 %
Lymphocytes Relative: 13 %
Lymphs Abs: 1.7 K/uL (ref 0.7–4.0)
MCH: 29.2 pg (ref 26.0–34.0)
MCHC: 32.6 g/dL (ref 30.0–36.0)
MCV: 89.3 fL (ref 80.0–100.0)
Monocytes Absolute: 0.6 K/uL (ref 0.1–1.0)
Monocytes Relative: 5 %
Neutro Abs: 9.3 K/uL — ABNORMAL HIGH (ref 1.7–7.7)
Neutrophils Relative %: 71 %
Platelets: 404 K/uL — ABNORMAL HIGH (ref 150–400)
RBC: 3.19 MIL/uL — ABNORMAL LOW (ref 3.87–5.11)
RDW: 15.3 % (ref 11.5–15.5)
WBC: 12.9 K/uL — ABNORMAL HIGH (ref 4.0–10.5)
nRBC: 0 % (ref 0.0–0.2)

## 2020-03-31 LAB — GLUCOSE, CAPILLARY
Glucose-Capillary: 95 mg/dL (ref 70–99)
Glucose-Capillary: 105 mg/dL — ABNORMAL HIGH (ref 70–99)
Glucose-Capillary: 132 mg/dL — ABNORMAL HIGH (ref 70–99)
Glucose-Capillary: 74 mg/dL (ref 70–99)
Glucose-Capillary: 98 mg/dL (ref 70–99)

## 2020-03-31 IMAGING — XA IR PERC PLACEMENT GASTROSTOMY
1 series · 2 of 2 positions shown · non-contrast
Comparison: none

INDICATION: 73-year-old female with a history of dysphagia

[Series 1: fl (-) angio · 2 of 2 slices shown]
[im 1/2]
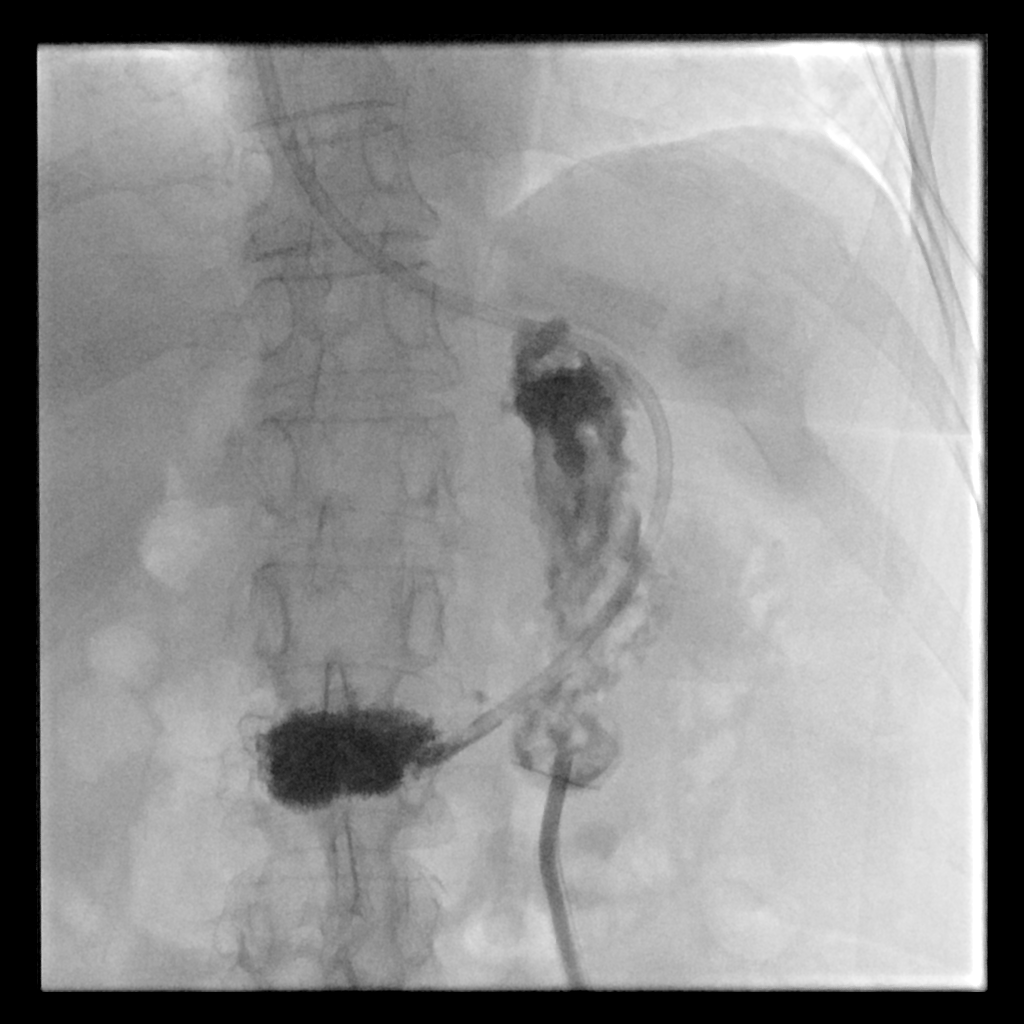
[im 2/2]
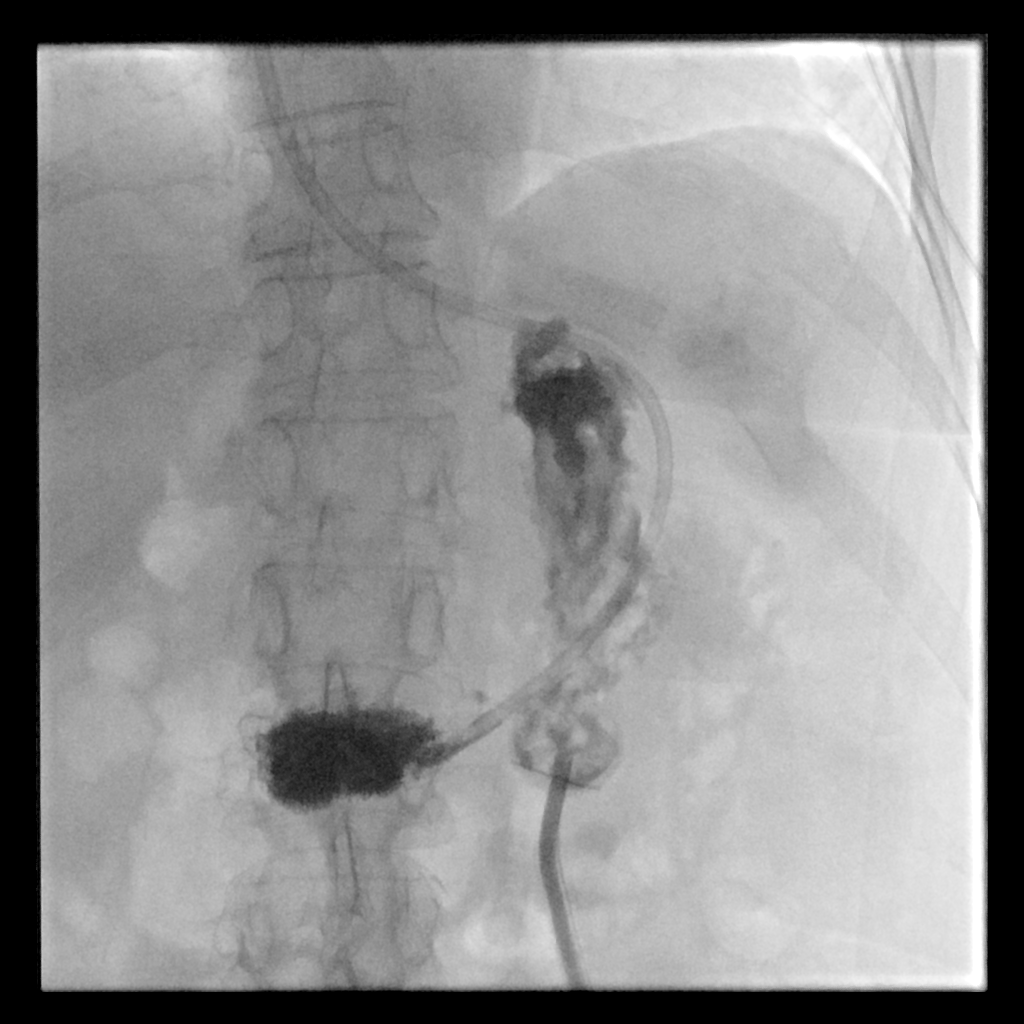

[2 of 2 positions shown; findings below may reference images not displayed]

EXAM:
PERC PLACEMENT GASTROSTOMY

MEDICATIONS:
Vancomycin 1 gm IV; Antibiotics were administered within 1 hour of
the procedure.

ANESTHESIA/SEDATION:
Versed 0.5 mg IV; Fentanyl 25 mcg IV

Moderate Sedation Time:  10 minutes

The patient was continuously monitored during the procedure by the
interventional radiology nurse under my direct supervision.

CONTRAST:  15mL OMNIPAQUE IOHEXOL 300 MG/ML SOLN - administered into
the gastric lumen.

FLUOROSCOPY TIME:  Fluoroscopy Time: 2 minutes 54 seconds (6 mGy).

COMPLICATIONS:
None

PROCEDURE:
Informed written consent was obtained from the patient and the
patient's family after a thorough discussion of the procedural
risks, benefits and alternatives. All questions were addressed.
Maximal Sterile Barrier Technique was utilized including caps, mask,
sterile gowns, sterile gloves, sterile drape, hand hygiene and skin
antiseptic. A timeout was performed prior to the initiation of the
procedure.

The epigastrium was prepped with Betadine in a sterile fashion, and
a sterile drape was applied covering the operative field. A sterile
gown and sterile gloves were used for the procedure.

A 5-French orogastric tube is placed under fluoroscopic guidance.
Scout imaging of the abdomen confirms barium within the transverse
colon.

The stomach was distended with gas. Under fluoroscopic guidance, an
18 gauge needle was utilized to puncture the anterior wall of the
body of the stomach. An Amplatz wire was advanced through the needle
passing a T fastener into the lumen of the stomach. The T fastener
was secured for gastropexy. A 9-French sheath was inserted.

A snare was advanced through the 9-French sheath. KATHARINA was
advanced through the orogastric tube. It was snared then pulled out
the oral cavity, pulling the snare, as well. The leading edge of the
gastrostomy was attached to the snare. It was then pulled down the
esophagus and out the percutaneous site. Tube secured in place.
Contrast was injected.

Patient tolerated the procedure well and remained hemodynamically
stable throughout.

No complications were encountered and no significant blood loss
encountered.
IMPRESSION: Status post fluoroscopic placed percutaneous gastrostomy tube, with
20 French pull-through.

## 2020-03-31 MED ORDER — VANCOMYCIN HCL IN DEXTROSE 1-5 GM/200ML-% IV SOLN
1000.0000 mg | Freq: Once | INTRAVENOUS | Status: AC
  Administered 2020-03-31: 1000 mg via INTRAVENOUS

## 2020-03-31 MED ORDER — FENTANYL CITRATE (PF) 100 MCG/2ML IJ SOLN
INTRAMUSCULAR | Status: AC | PRN
  Administered 2020-03-31: 25 ug via INTRAVENOUS

## 2020-03-31 MED ORDER — MIDAZOLAM HCL 2 MG/2ML IJ SOLN
INTRAMUSCULAR | Status: AC
  Administered 2020-03-31: 0.5 mg
  Filled 2020-03-31: qty 2

## 2020-03-31 MED ORDER — CLOPIDOGREL BISULFATE 75 MG PO TABS
75.0000 mg | ORAL_TABLET | Freq: Every day | ORAL | Status: DC
  Administered 2020-04-01 – 2020-04-27 (×27): 75 mg
  Filled 2020-03-31 (×27): qty 1

## 2020-03-31 MED ORDER — MIDAZOLAM HCL 2 MG/2ML IJ SOLN
INTRAMUSCULAR | Status: AC | PRN
  Administered 2020-03-31: 0.5 mg via INTRAVENOUS

## 2020-03-31 MED ORDER — LIDOCAINE HCL 1 % IJ SOLN
INTRAMUSCULAR | Status: AC
  Filled 2020-03-31: qty 20

## 2020-03-31 MED ORDER — IOHEXOL 300 MG/ML  SOLN
50.0000 mL | Freq: Once | INTRAMUSCULAR | Status: AC | PRN
  Administered 2020-03-31: 15 mL

## 2020-03-31 MED ORDER — VANCOMYCIN HCL IN DEXTROSE 1-5 GM/200ML-% IV SOLN
INTRAVENOUS | Status: AC
  Administered 2020-03-31: 1000 mg
  Filled 2020-03-31: qty 200

## 2020-03-31 MED ORDER — HEPARIN SODIUM (PORCINE) 5000 UNIT/ML IJ SOLN
5000.0000 [IU] | Freq: Three times a day (TID) | INTRAMUSCULAR | Status: DC
  Administered 2020-03-31 – 2020-04-27 (×80): 5000 [IU] via SUBCUTANEOUS
  Filled 2020-03-31 (×80): qty 1

## 2020-03-31 MED ORDER — FENTANYL CITRATE (PF) 100 MCG/2ML IJ SOLN
INTRAMUSCULAR | Status: AC
  Administered 2020-03-31: 25 ug
  Filled 2020-03-31: qty 2

## 2020-03-31 MED ORDER — LIDOCAINE HCL 1 % IJ SOLN
INTRAMUSCULAR | Status: AC | PRN
  Administered 2020-03-31: 5 mL

## 2020-03-31 MED ORDER — BACITRACIN-NEOMYCIN-POLYMYXIN 400-5-5000 EX OINT
1.0000 "application " | TOPICAL_OINTMENT | Freq: Every day | CUTANEOUS | Status: AC
  Administered 2020-03-31 – 2020-04-06 (×7): 1 via TOPICAL
  Filled 2020-03-31 (×4): qty 1

## 2020-03-31 NOTE — Progress Notes (Signed)
OT NOTE  RN STAFF  Pt is to wear splint at night only for 6-8 hours (NO MORE THAN 8 hrs)  Please check splint every 4 hours during shift ( remove splint , to assess for): * pain * redness *swelling Straps are labeled on which to keep loose fitted per pt tolerance.  If any symptoms above present remove splint for 15 minutes. If symptoms continue - keep the splint removed and notify OT staff [phone] immediately.   Keep the UE elevated at all times on pillows / towels.  Splint can be cleaned with warm soapy water and alcohol swab.  Thank you!     Dalphine Handing, MSOT, OTR/L Acute Rehabilitation Services [facility] Office Number: [phone] Pager: [phone]

## 2020-03-31 NOTE — Procedures (Signed)
Interventional Radiology Procedure Note  Procedure: Placement of percutaneous 20F pull-through gastrostomy tube. Complications: None Recommendations: - NPO except for sips and chips remainder of today and overnight - Maintain G-tube to LWS until tomorrow morning  - May advance diet as tolerated and begin using tube tomorrow morning  Signed,   Zubayr Bednarczyk S. Barrie Wale, DO   

## 2020-03-31 NOTE — Progress Notes (Signed)
Occupational Therapy Treatment Patient Details Name: Tasha [name] MRN: [id number] DOB: [birth year] Today's Date: 03/31/2020    History of present illness Tasha [name] is a 74 y.o. female with history of stroke in June, HTN, MI and DM presenting 7/12 with nausea and vomiting, altered mental status, admitted w/ uremia and possible infection. In hospital, R sided weakness not improving, MRI showed new acute infarcts.    OT comments  Pt seen for additional session to apply bil resting hand splints. PROM of bil hands completed as outlined below. Noted increased tone/tremor in LUE with full flaccidity in RUE. Bil splints applied and pts hands propped on pillows. RN informed and educated on potential splint plan, formal education will follow once OT establishes appropriate wearing schedule. During session, pt vomited yellow/orange color- requiring suction, RN notified. Will continue to follow this date for tolerance of splints after 1 hour.    Follow Up Recommendations  SNF    Equipment Recommendations  None recommended by OT    Recommendations for Other Services      Precautions / Restrictions Precautions Precautions: Fall Precaution Comments: right hemiplegia, coretrack Restrictions Weight Bearing Restrictions: No       Mobility Bed Mobility               General bed mobility comments: pt leaning towards L in bed, total assist +2 to reposition trunk to midline, supported by pillows  Transfers                      Balance                                           ADL either performed or assessed with clinical judgement   ADL                                         General ADL Comments: Session focused on splint fitting and tolerance, as well as Engineer, manufacturing education     Vision       Perception     Praxis      Cognition Arousal/Alertness: Lethargic Behavior During Therapy: Flat affect Overall Cognitive Status: Difficult to  assess                                 General Comments: Difficult to fully assess cognition this date due to pt fatigue. She was able to participate in brief conversation with OT, but needed max cues to continue to maintain attention when asking about her tattoos.        Exercises Hand Exercises Wrist Flexion: PROM;Both;10 reps Wrist Extension: PROM;Both;10 reps Digit Composite Flexion: PROM;Both;10 reps Composite Extension: PROM;Both;10 reps Low Level/ICU Exercises Shoulder Flexion: PROM;Both;10 reps Elbow Flexion: PROM;Both;10 reps   Shoulder Instructions       General Comments      Pertinent Vitals/ Pain       Pain Assessment: Faces Faces Pain Scale: Hurts a little bit Pain Location: grimacing with repositioning in bed  Pain Descriptors / Indicators: Grimacing;Discomfort;Moaning Pain Intervention(s): Monitored during session;Repositioned  Home Living  Prior Functioning/Environment              Frequency  Min 2X/week        Progress Toward Goals  OT Goals(current goals can now be found in the care plan section)  Progress towards OT goals: Progressing toward goals  Acute Rehab OT Goals OT Goal Formulation: Patient unable to participate in goal setting Time For Goal Achievement: 04/14/20 Potential to Achieve Goals: Fair  Plan Discharge plan remains appropriate;Frequency remains appropriate    Co-evaluation                 AM-PAC OT "6 Clicks" Daily Activity     Outcome Measure   Help from another person eating meals?: Total Help from another person taking care of personal grooming?: Total Help from another person toileting, which includes using toliet, bedpan, or urinal?: Total Help from another person bathing (including washing, rinsing, drying)?: Total Help from another person to put on and taking off regular upper body clothing?: Total Help from another person to put  on and taking off regular lower body clothing?: Total 6 Click Score: 6    End of Session Equipment Utilized During Treatment: Other (comment) (bil resting hand splints)  OT Visit Diagnosis: Unsteadiness on feet (R26.81);History of falling (Z91.81);Muscle weakness (generalized) (M62.81);Other symptoms and signs involving the nervous system (R29.898);Other symptoms and signs involving cognitive function;Cognitive communication deficit (R41.841);Other abnormalities of gait and mobility (R26.89)   Activity Tolerance Patient limited by lethargy   Patient Left in bed;with call bell/phone within reach;with bed alarm set;with nursing/sitter in room   Nurse Communication Mobility status;Other (comment) (pt vomiting)        Time: 4166-0630 OT Time Calculation (min): 15 min  Charges: OT General Charges $OT Visit: 1 Visit OT Treatments $Therapeutic Activity: 8-22 mins  Dalphine Handing, MSOT, OTR/L Acute Rehabilitation Services [facility] Office Number: [phone] Pager: [phone]  Dalphine Handing 03/31/2020, 6:43 PM

## 2020-03-31 NOTE — Progress Notes (Signed)
Occupational Therapy Treatment Patient Details Name: Wilfred Siverson MRN: [id number] DOB: 09-Sep-1945 Today's Date: 03/31/2020    History of present illness Arraya Buck is a 74 y.o. female with history of stroke in June, HTN, MI and DM presenting 7/12 with nausea and vomiting, altered mental status, admitted w/ uremia and possible infection. In hospital, R sided weakness not improving, MRI showed new acute infarcts.    OT comments  Pt seen for splint check after 1 hour of wear. Noted skin indentations near bil CMC joints from cross strap. Strap loosened with appropriate relief. OT then labeled strap for RN staff to be aware to place loosely on pt hand. OT plans to follow up tomorrow after 1st night of wear to ensure skin integrity remains intact. Sign posted in room of wearing schedule, RN staff educated to pass on to night shift. Will continue to follow.    Follow Up Recommendations  SNF    Equipment Recommendations  None recommended by OT    Recommendations for Other Services      Precautions / Restrictions Precautions Precautions: Fall Precaution Comments: right hemiplegia, coretrack Restrictions Weight Bearing Restrictions: No       Mobility Bed Mobility               General bed mobility comments: pt leaning towards L in bed, total assist +2 to reposition trunk to midline, supported by pillows  Transfers                      Balance                                           ADL either performed or assessed with clinical judgement   ADL                                         General ADL Comments: Session focused on splint fitting and tolerance, as well as Engineer, manufacturing education     Vision       Perception     Praxis      Cognition Arousal/Alertness: Lethargic Behavior During Therapy: Flat affect Overall Cognitive Status: Difficult to assess                                 General Comments: pt more  lethargic upon OT second return for splint check, was not verbal with minimal eye opening. Had been vomiting again        Exercises Hand Exercises Wrist Flexion: PROM;Both;10 reps Wrist Extension: PROM;Both;10 reps Digit Composite Flexion: PROM;Both;10 reps Composite Extension: PROM;Both;10 reps Low Level/ICU Exercises Shoulder Flexion: PROM;Both;10 reps Elbow Flexion: PROM;Both;10 reps   Shoulder Instructions       General Comments      Pertinent Vitals/ Pain       Pain Assessment: Faces Faces Pain Scale: Hurts a little bit Pain Location: grimacing with repositioning in bed  Pain Descriptors / Indicators: Grimacing;Discomfort;Moaning Pain Intervention(s): Monitored during session;Repositioned  Home Living  Prior Functioning/Environment              Frequency  Min 2X/week        Progress Toward Goals  OT Goals(current goals can now be found in the care plan section)  Progress towards OT goals: Not progressing toward goals - comment (lethargic, less participatory)  Acute Rehab OT Goals Patient Stated Goal: Unable to state OT Goal Formulation: Patient unable to participate in goal setting Time For Goal Achievement: 04/14/20 Potential to Achieve Goals: Fair  Plan Discharge plan remains appropriate;Frequency remains appropriate    Co-evaluation                 AM-PAC OT "6 Clicks" Daily Activity     Outcome Measure   Help from another person eating meals?: Total Help from another person taking care of personal grooming?: Total Help from another person toileting, which includes using toliet, bedpan, or urinal?: Total Help from another person bathing (including washing, rinsing, drying)?: Total Help from another person to put on and taking off regular upper body clothing?: Total Help from another person to put on and taking off regular lower body clothing?: Total 6 Click Score: 6    End of  Session Equipment Utilized During Treatment: Other (comment) (bil resting hand splints)  OT Visit Diagnosis: Unsteadiness on feet (R26.81);History of falling (Z91.81);Muscle weakness (generalized) (M62.81);Other symptoms and signs involving the nervous system (R29.898);Other symptoms and signs involving cognitive function;Cognitive communication deficit (R41.841);Other abnormalities of gait and mobility (R26.89)   Activity Tolerance Patient limited by lethargy   Patient Left in bed;with call bell/phone within reach;with bed alarm set;with nursing/sitter in room   Nurse Communication Mobility status;Other (comment) (vomiting x2)        Time: 3662-9476 OT Time Calculation (min): 12 min  Charges: OT General Charges $OT Visit: 1 Visit OT Treatments $Therapeutic Activity: 8-22 mins  Dalphine Handing, MSOT, OTR/L Acute Rehabilitation Services [facility] Office Number: [phone] Pager: [phone]  Dalphine Handing 03/31/2020, 6:48 PM

## 2020-03-31 NOTE — Progress Notes (Signed)
Occupational Therapy Treatment Patient Details Name: Tasha [name] MRN: [id number] DOB: 1946-06-10 Today's Date: 03/31/2020    History of present illness Tasha [name] is a 74 y.o. female with history of stroke in June, HTN, MI and DM presenting 7/12 with nausea and vomiting, altered mental status, admitted w/ uremia and possible infection. In hospital, R sided weakness not improving, MRI showed new acute infarcts.    OT comments  Patient seen to initiate splint wearing tolerance/schedule for B resting hand splints. Provided PROM and gentle stretch to B UEs supine, then applied splints with UEs elevated on pillows.  Trial of 10 minutes only, as after session RN reports transporting coming for PEG placement therefore therapist removed splints.  With 10 minute trial, no redness or irritation/discomfort noted; decreased tone and tremors noted especially in L UE.  Discussed plan with RN for longer trial prior to establishing splint schedule, RN agreeable.  Will follow up today/tomorrow as able.    Follow Up Recommendations  SNF    Equipment Recommendations  Other (comment) (TBD at next venue of care )    Recommendations for Other Services      Precautions / Restrictions Precautions Precautions: Fall Precaution Comments: right hemiplegia, coretrack Restrictions Weight Bearing Restrictions: No       Mobility Bed Mobility               General bed mobility comments: pt leaning towards L in bed, total assist +2 to reposition trunk to midline, supported by pillows  Transfers                      Balance                                           ADL either performed or assessed with clinical judgement   ADL                                               Vision       Perception     Praxis      Cognition Arousal/Alertness: Lethargic Behavior During Therapy: Flat affect Overall Cognitive Status: Difficult to assess                                           Exercises Exercises: General Upper Extremity General Exercises - Upper Extremity Shoulder Flexion: PROM;Both;5 reps;Supine Shoulder Extension: PROM;Both;Supine;5 reps Elbow Flexion: PROM;Both;5 reps;Supine Elbow Extension: PROM;Both;5 reps;Supine Wrist Flexion: PROM;Both;5 reps;Supine Wrist Extension: PROM;Both;5 reps;Supine Digit Composite Flexion: PROM;Both;5 reps;Supine Composite Extension: PROM;Both;5 reps;Supine   Shoulder Instructions       General Comments Ordered resting hand splints yesterday, focus of session was PROM and fit of splints. Patient lethargic with poor awareness of splint management.  Donned splints with good fit, spoke to nurse about planned schedule once ensured no redness or irritation noted.  RN reports transport coming for PEG tube, therefore removed splints but for 10 minute trial no redness, edema or irritations noted; improved extension and decreased tone, espeically in L UE (which was tremoring upon entry).  WIll follow up for longer trial before creating splint schedule.  Pertinent Vitals/ Pain       Pain Assessment: Faces Faces Pain Scale: Hurts a little bit Pain Location: grimacing with repositioning in bed  Pain Descriptors / Indicators: Grimacing;Discomfort;Moaning Pain Intervention(s): Monitored during session;Limited activity within patient's tolerance;Repositioned  Home Living                                          Prior Functioning/Environment              Frequency  Min 2X/week        Progress Toward Goals  OT Goals(current goals can now be found in the care plan section)  Progress towards OT goals: Not progressing toward goals - comment (lethargic and requiring total assist )     Plan Discharge plan remains appropriate;Frequency remains appropriate    Co-evaluation                 AM-PAC OT "6 Clicks" Daily Activity     Outcome Measure    Help from another person eating meals?: Total Help from another person taking care of personal grooming?: Total Help from another person toileting, which includes using toliet, bedpan, or urinal?: Total Help from another person bathing (including washing, rinsing, drying)?: Total Help from another person to put on and taking off regular upper body clothing?: Total Help from another person to put on and taking off regular lower body clothing?: Total 6 Click Score: 6    End of Session    OT Visit Diagnosis: Unsteadiness on feet (R26.81);History of falling (Z91.81);Muscle weakness (generalized) (M62.81);Other symptoms and signs involving the nervous system (R29.898);Other symptoms and signs involving cognitive function;Cognitive communication deficit (R41.841);Other abnormalities of gait and mobility (R26.89)   Activity Tolerance Patient limited by lethargy   Patient Left in bed;with call bell/phone within reach;with bed alarm set;with nursing/sitter in room   Nurse Communication Mobility status;Other (comment) (splints- B resting hand splints )        Time: 0955-1010 OT Time Calculation (min): 15 min  Charges: OT General Charges $OT Visit: 1 Visit OT Treatments $Orthotics Fit/Training: 8-22 mins  Barry Brunner, OT Acute Rehabilitation Services Pager [phone] Office [phone]     Chancy Milroy 03/31/2020, 10:28 AM

## 2020-03-31 NOTE — Progress Notes (Signed)
Patient's splint applied bilateral hands

## 2020-03-31 NOTE — Progress Notes (Signed)
PROGRESS NOTE    Tasha [name]  NTI:[id number] DOB: Aug 20, 1946 DOA: 03/08/2020 PCP: Patient, No Pcp Per   Brief Narrative: Patient is a 74 year old with a history of stroke, DM2, HTN, and MI who was discharged June24,2021 following a stroke work-up and return to the hospital 03/08/2020 with 2 days of intractable nausea and vomiting along with altered mental status. On presentation,She was found to be have  lactic acidosis and acute kidney injury. Following admission the patient improved with supportive care. Due to persisting right-sided weakness which was felt to be worse than previously documented, an MRI was obtained  on 7/18 and showed  an acute left pontine infarct and acute small infarct on the posterior body of the right lateral ventricle. Neurology was following, now signed off.  Stroke work-up completed.  She is still on tube feeding because of severe dysphagia.  Plan is to discharge her to LTAC versus skilled nursing facility.SW closely following.  Plan is to place PEG by IR today and plan for discharge to SNF.  Assessment & Plan:   Active Problems:   CVA (cerebral vascular accident) (HCC)   DM (diabetes mellitus), type 2 with peripheral vascular complications (HCC)   Essential (primary) hypertension   Lactic acidosis   Nausea and vomiting   AKI (acute kidney injury) (HCC)   Hyperkalemia   Sepsis (HCC)   Coffee ground emesis   Palliative care encounter  Acute CVA  On 7/18 with history of recent CVA June 2021 MRI as above Patient was on clopidogrel 75 mg dailyprior to admission, now on aspirin 81 mg daily and clopidogrel 75 mg daily.Plan is to continue DAPT x 3 weeks again then plavix alone. She is also on crestor.Plavix held for PEG  Acute kidney injury Most consistent with prerenal state, w/ probable component of ATN Bun/cr on admission at 149/9.5, improving nicely  Nephrology signed off, nephrology will set up follow-up appointment with Dr. Malen Gauze at [facility]  2 weeks after discharge  Hypernatremia Resolved.  Continue free water  Acute toxic metabolic encephalopathy Due  lacunaracute CVA,on presentation uremia was also suspected to have contributed to AMS EEGunrevealing TSH normal-B12 and folic acid not deficient Mental status has not significantly improved.  She remains lethargic. Continues to remain feeding tube dependent so we are planning to place the PEG.  SIRS v/s Sepsis- POA Hypothermia, leukocytosis, and lactic acidosis present at admission-no clear source of infection identified-CXR at presentation with atelectasis only-CT abdomen pelvis without acute findings-UA not consistent with UTI and culture without growth-blood cultures no growth x2-was treated with broad-spectrum empiric antibiotic for 7-day course  Intractable nausea vomiting and coffee-ground emesis Hemoccult negative-probable Mallory-Weiss teardue to recurrent vomiting caused by uremia-all anticoagulants/antiplatelet meds held initially but resumed Plavix on 7/17 and aspirin on  7/19  Dysphagia NPO as per  SLP recommendations On tube feeding Continues to remain feeding tube dependent so we are planning to place the PEG.  Noninsulin-dependent type II DM 2, controlled, A1c 6.9 Home medication Metformin held Continue current insulin regimen.  Diabetic coordinator ollowing.  HTN lopressor to 100 mg twice a day, Norvasc 10 mg daily BP stable  NSVT, keep mag>2, k>4,   Depression: Family desires treatment ,on  low-dose Remeron,   Disposition: Continues to require defeating for feeding propose, unable to swallow.  In current circumstances, needs LTAC.  Social worker following  Goals of care: Had a long discussion with the son on 03/23/20.  Given her debility, poor quality of life, I offered him palliative care  consultation discuss about goals of care.  Son is currently focused on placing her in [place] .  But since her overall status is not improving  and she is dependent on tube feeds, she might be a candidate for hospice.  But her  son wants to continue full scope of treatment.  Fever: resolved .  UA not impressive for UTI.  Chest x-ray did not show any pneumonia.  Nutrition Problem: Inadequate oral intake Etiology: lethargy/confusion      DVT prophylaxis:Heparin [place] Code Status: Full Family Communication: Discussed with daughter-in-law at the bedside Status is: Inpatient  Remains inpatient appropriate because:Unsafe d/c plan   Dispo:  Patient From:    Planned Disposition: SNF  Expected discharge date: Not sure  Medically stable for discharge: yes  Waiting for PEG tube placement by IR  Consultants: Neurology  Procedures: Tube feeding placement Antimicrobials:  Anti-infectives (From admission, onward)   Start     Dose/Rate Route Frequency Ordered Stop   03/10/20 1200  cefTRIAXone (ROCEPHIN) 2 g in sodium chloride 0.9 % 100 mL IVPB        2 g 200 mL/hr over 30 Minutes Intravenous Every 24 hours 03/10/20 0924 03/14/20 1333   03/09/20 1000  ceFEPIme (MAXIPIME) 1 g in sodium chloride 0.9 % 100 mL IVPB  Status:  Discontinued        1 g 200 mL/hr over 30 Minutes Intravenous Every 24 hours 03/08/20 1219 03/10/20 0924   03/08/20 1219  vancomycin variable dose per unstable renal function (pharmacist dosing)  Status:  Discontinued         Does not apply See admin instructions 03/08/20 1219 03/10/20 0906   03/08/20 1100  vancomycin (VANCOCIN) IVPB 1000 mg/200 mL premix        1,000 mg 200 mL/hr over 60 Minutes Intravenous  Once 03/08/20 1046 03/08/20 1406   03/08/20 1100  ceFEPIme (MAXIPIME) 2 g in sodium chloride 0.9 % 100 mL IVPB        2 g 200 mL/hr over 30 Minutes Intravenous  Once 03/08/20 1046 03/08/20 1211      Subjective:  Patient seen and examined the bedside today.  Hemodynamically stable.  No changes from yesterday.  Plan for PEG today  Objective: Vitals:   03/30/20 1649 03/30/20 1935 03/30/20 2322 03/31/20  0358  BP: (!) 120/56 135/60 137/61 139/69  Pulse: 61 77 70 70  Resp: 16 18 17 17   Temp: 98.2 F (36.8 C) 99.5 F (37.5 C) 98.4 F (36.9 C) 98.9 F (37.2 C)  TempSrc: Axillary Oral Oral Oral  SpO2: 100% 95% 94% 97%  Weight:      Height:        Intake/Output Summary (Last 24 hours) at 03/31/2020 0833 Last data filed at 03/31/2020 0233 Gross per 24 hour  Intake 110 ml  Output 1550 ml  Net -1440 ml   Filed Weights   03/16/20 0158 03/23/20 0343 03/24/20 0345  Weight: 60.5 kg 78.9 kg 80.3 kg    Examination:   General exam: Deconditioned, debilitated, chronically ill looking, weak HEENT: feeding tube Respiratory system: Bilateral equal air entry, normal vesicular breath sounds, no wheezes or crackles  Cardiovascular system: S1 & S2 heard, RRR. No JVD, murmurs, rubs, gallops or clicks. Gastrointestinal system: Abdomen is nondistended, soft and nontender. No organomegaly or masses felt. Normal bowel sounds heard. Central nervous system: Not Alert and oriented. Extremities: No edema, no clubbing ,no cyanosis Skin: No rashes, lesions or ulcers,no icterus ,no pallor GU : foley  Data Reviewed: I have personally reviewed following labs and imaging studies  CBC: Recent Labs  Lab 03/29/20 0120 03/31/20 0434  WBC 12.6* 12.9*  NEUTROABS 8.8* 9.3*  HGB 9.0* 9.3*  HCT 28.1* 28.5*  MCV 89.8 89.3  PLT 316 404*   Basic Metabolic Panel: Recent Labs  Lab 03/25/20 0233 03/26/20 0424 03/29/20 0120 03/31/20 0434  NA 139 139 138 135  K 3.3* 3.8 4.1 4.1  CL 104 105 107 103  CO2 21* 22 23 21*  GLUCOSE 180* 198* 143* 100*  BUN 122* 107* 61* 49*  CREATININE 2.07* 2.07* 1.52* 1.21*  CALCIUM 9.0 9.2 9.1 9.2   GFR: Estimated Creatinine Clearance: 42.4 mL/min (A) (by C-G formula based on SCr of 1.21 mg/dL (H)). Liver Function Tests: No results for input(s): AST, ALT, ALKPHOS, BILITOT, PROT, ALBUMIN in the last 168 hours. No results for input(s): LIPASE, AMYLASE in the last 168  hours. No results for input(s): AMMONIA in the last 168 hours. Coagulation Profile: Recent Labs  Lab 03/31/20 0434  INR 1.1   Cardiac Enzymes: No results for input(s): CKTOTAL, CKMB, CKMBINDEX, TROPONINI in the last 168 hours. BNP (last 3 results) No results for input(s): PROBNP in the last 8760 hours. HbA1C: No results for input(s): HGBA1C in the last 72 hours. CBG: Recent Labs  Lab 03/30/20 1233 03/30/20 1647 03/30/20 1932 03/30/20 2336 03/31/20 0356  GLUCAP 190* 142* 136* 207* 95   Lipid Profile: No results for input(s): CHOL, HDL, LDLCALC, TRIG, CHOLHDL, LDLDIRECT in the last 72 hours. Thyroid Function Tests: No results for input(s): TSH, T4TOTAL, FREET4, T3FREE, THYROIDAB in the last 72 hours. Anemia Panel: No results for input(s): VITAMINB12, FOLATE, FERRITIN, TIBC, IRON, RETICCTPCT in the last 72 hours. Sepsis Labs: No results for input(s): PROCALCITON, LATICACIDVEN in the last 168 hours.  No results found for this or any previous visit (from the past 240 hour(s)).       Radiology Studies: No results found.      Scheduled Meds:  amLODipine  10 mg Per Tube Daily   aspirin  81 mg Per Tube Daily   chlorhexidine  15 mL Mouth Rinse BID   Chlorhexidine Gluconate Cloth  6 each Topical Daily   free water  400 mL Per Tube Q6H   hydrALAZINE  50 mg Per Tube Q8H   insulin aspart  0-15 Units Subcutaneous TID WC   insulin aspart  5 Units Subcutaneous Q4H   insulin detemir  10 Units Subcutaneous BID   mouth rinse  15 mL Mouth Rinse BID   metoprolol tartrate  100 mg Per Tube BID   mirtazapine  7.5 mg Per Tube QHS   pantoprazole sodium  40 mg Per Tube Daily   rosuvastatin  5 mg Per Tube Daily   Continuous Infusions:  feeding supplement (GLUCERNA 1.2 CAL) Stopped (03/31/20 0001)     LOS: 23 days    Time spent:15 mins. More than 50% of that time was spent in counseling and/or coordination of care.      Burnadette Pop, MD [facility] P8/11/2019, 8:33 AM

## 2020-04-01 LAB — GLUCOSE, CAPILLARY
Glucose-Capillary: 116 mg/dL — ABNORMAL HIGH (ref 70–99)
Glucose-Capillary: 119 mg/dL — ABNORMAL HIGH (ref 70–99)
Glucose-Capillary: 163 mg/dL — ABNORMAL HIGH (ref 70–99)
Glucose-Capillary: 109 mg/dL — ABNORMAL HIGH (ref 70–99)
Glucose-Capillary: 94 mg/dL (ref 70–99)
Glucose-Capillary: 128 mg/dL — ABNORMAL HIGH (ref 70–99)

## 2020-04-01 MED ORDER — METOPROLOL TARTRATE 5 MG/5ML IV SOLN
5.0000 mg | Freq: Once | INTRAVENOUS | Status: AC
  Administered 2020-04-01: 5 mg via INTRAVENOUS
  Filled 2020-04-01: qty 5

## 2020-04-01 NOTE — Progress Notes (Signed)
Patient had an episode of emesis from mouth that was brown in color.

## 2020-04-01 NOTE — Progress Notes (Signed)
Occupational Therapy Treatment Patient Details Name: Tasha [name] MRN: [id number] DOB: 22-May-1946 Today's Date: 04/01/2020    History of present illness Tasha [name] is a 74 y.o. female with history of stroke in June, HTN, MI and DM presenting 7/12 with nausea and vomiting, altered mental status, admitted w/ uremia and possible infection. In hospital, R sided weakness not improving, MRI showed new acute infarcts.    OT comments  Pt splint checked this AM after first full night of wear. No skin impressions/compromise noted. Splints were doffed on OT arrival at appropriate timing. This wearing schedule of 6-8 hours at night remains appropriate for pt at this time. Continue to keep BUEs propped on pillow while splinting. Pt bil wrists and digits mobilizing well after night time splinting. Tremor remains in LUE. Will continue to follow per POC listed below.    Follow Up Recommendations  SNF    Equipment Recommendations  None recommended by OT    Recommendations for Other Services      Precautions / Restrictions Precautions Precautions: Fall Precaution Comments: right hemiplegia, coretrack Required Braces or Orthoses: Splint/Cast Splint/Cast: bil resting hand splints- night wear only Restrictions Weight Bearing Restrictions: No       Mobility Bed Mobility Overal bed mobility: Needs Assistance             General bed mobility comments: pt with head fixed to the R toward window, total A to reposition  Transfers                      Balance                                           ADL either performed or assessed with clinical judgement   ADL Overall ADL's : Needs assistance/impaired     Grooming: Total assistance Grooming Details (indicate cue type and reason): total A in attempt to wipe mouth with wash cloth                               General ADL Comments: Session focused on splint fitting and tolerance, as well as Engineer, manufacturing  education     Vision       Perception     Praxis      Cognition Arousal/Alertness: Lethargic Behavior During Therapy: Flat affect Overall Cognitive Status: Difficult to assess                                 General Comments: pt less responsive today then previous date, only responding when name is called.        Exercises Hand Exercises Wrist Flexion: PROM;Both;10 reps Wrist Extension: PROM;Both;10 reps Digit Composite Flexion: PROM;Both;10 reps Composite Extension: PROM;Both;10 reps   Shoulder Instructions       General Comments      Pertinent Vitals/ Pain       Pain Assessment: Faces Faces Pain Scale: Hurts a little bit Pain Location: grimacing with repositioning in bed  Pain Descriptors / Indicators: Grimacing;Discomfort;Moaning Pain Intervention(s): Monitored during session;Repositioned  Home Living  Prior Functioning/Environment              Frequency  Min 2X/week        Progress Toward Goals  OT Goals(current goals can now be found in the care plan section)  Progress towards OT goals: Not progressing toward goals - comment (lethargic, minimally responsive, vomiting. Did tolerate splints well)  Acute Rehab OT Goals OT Goal Formulation: Patient unable to participate in goal setting Time For Goal Achievement: 04/14/20 Potential to Achieve Goals: Fair  Plan Discharge plan remains appropriate;Frequency remains appropriate    Co-evaluation                 AM-PAC OT "6 Clicks" Daily Activity     Outcome Measure   Help from another person eating meals?: Total Help from another person taking care of personal grooming?: Total Help from another person toileting, which includes using toliet, bedpan, or urinal?: Total Help from another person bathing (including washing, rinsing, drying)?: Total Help from another person to put on and taking off regular upper body  clothing?: Total Help from another person to put on and taking off regular lower body clothing?: Total 6 Click Score: 6    End of Session Equipment Utilized During Treatment: Other (comment) (bil resting hand splints)  OT Visit Diagnosis: Unsteadiness on feet (R26.81);History of falling (Z91.81);Muscle weakness (generalized) (M62.81);Other symptoms and signs involving the nervous system (R29.898);Other symptoms and signs involving cognitive function;Cognitive communication deficit (R41.841);Other abnormalities of gait and mobility (R26.89)   Activity Tolerance Patient limited by lethargy   Patient Left in bed;with call bell/phone within reach;with bed alarm set;with nursing/sitter in room   Nurse Communication Mobility status        Time: 8299-3716 OT Time Calculation (min): 9 min  Charges: OT General Charges $OT Visit: 1 Visit OT Treatments $Therapeutic Activity: 8-22 mins  Dalphine Handing, MSOT, OTR/L Acute Rehabilitation Services [facility] Office Number: [phone] Pager: [phone]  Dalphine Handing 04/01/2020, 9:51 AM

## 2020-04-01 NOTE — Progress Notes (Signed)
SLP Cancellation Note  Patient Details Name: Tasha [name] MRN: [id number] DOB: 09-05-1945   Cancelled treatment:       Reason Eval/Treat Not Completed: Medical issues which prohibited therapy. Pt has been having episodes of emesis per RN. Not appropriate for POs from a medical standpoint but also not very alert this morning. Will continue to follow.    Mahala Menghini., M.A. CCC-SLP Acute Rehabilitation Services Pager [phone] Office [phone]  04/01/2020, 8:52 AM

## 2020-04-01 NOTE — Progress Notes (Addendum)
PROGRESS NOTE    Tasha [name]  TMH:[id number] DOB: Mar 30, 1946 DOA: 03/08/2020 PCP: Patient, No Pcp Per   Brief Narrative: Patient is a 74 year old with a history of stroke, DM2, HTN, and MI who was discharged June24,2021 following a stroke work-up and return to the hospital 03/08/2020 with 2 days of intractable nausea and vomiting along with altered mental status. On presentation,She was found to be have  lactic acidosis and acute kidney injury. Following admission the patient improved with supportive care. Due to persisting right-sided weakness which was felt to be worse than previously documented, an MRI was obtained  on 7/18 and showed  an acute left pontine infarct and acute small infarct on the posterior body of the right lateral ventricle. Neurology was following, now signed off.  Stroke work-up completed.  She was on prolonged tube feeding because of severe dysphagia.  SW closely following. She underwent PEG plus placement by IR on 03/31/2020.Plan for return to skilled nursing facility tomorrow .  Assessment & Plan:   Active Problems:   CVA (cerebral vascular accident) (HCC)   DM (diabetes mellitus), type 2 with peripheral vascular complications (HCC)   Essential (primary) hypertension   Lactic acidosis   Nausea and vomiting   AKI (acute kidney injury) (HCC)   Hyperkalemia   Sepsis (HCC)   Coffee ground emesis   Palliative care encounter  Acute CVA  On 7/18 with history of recent CVA June 2021 MRI as above Patient was on clopidogrel 75 mg dailyprior to admission, now on aspirin 81 mg daily and clopidogrel 75 mg daily.Plan is to continue DAPT x 3 weeks again then plavix alone. She is also on crestor.  Acute kidney injury Most consistent with prerenal state, w/ probable component of ATN Bun/cr on admission at 149/9.5, improving nicely  Nephrology signed off, nephrology will set up follow-up appointment with Dr. Malen Gauze at [facility] 2 weeks after  discharge  Hypernatremia Resolved.  Continue free water  Acute toxic metabolic encephalopathy Due  lacunaracute CVA,on presentation uremia was also suspected to have contributed to AMS EEGunrevealing TSH normal-B12 and folic acid not deficient Mental status has not significantly improved.  She remains lethargic. Continues to remain feeding tube dependent so she underwent  PEG placement on 03/31/20  SIRS v/s Sepsis- POA Hypothermia, leukocytosis, and lactic acidosis present at admission-no clear source of infection identified-CXR at presentation with atelectasis only-CT abdomen pelvis without acute findings-UA not consistent with UTI and culture without growth-blood cultures no growth x2-was treated with broad-spectrum empiric antibiotic for 7-day course  Intractable nausea vomiting and coffee-ground emesis Hemoccult negative-probable Mallory-Weiss teardue to recurrent vomiting caused by uremia-all anticoagulants/antiplatelet meds held initially but resumed Plavix on 7/17 and aspirin on  7/19  Dysphagia NPO as per  SLP recommendations Continues to remain feeding tube dependent so placed the PEG.  Noninsulin-dependent type II DM 2, controlled, A1c 6.9 Home medication Metformin held Continue current insulin regimen.  Diabetic coordinator was following.  HTN lopressor to 100 mg twice a day, Norvasc 10 mg daily BP stable  NSVT, keep mag>2, k>4,   Depression: Family desires treatment ,on  low-dose Remeron,   Goals of care: Had a long discussion with the son on 03/23/20.  Given her debility, poor quality of life, I offered him palliative care consultation discuss about goals of care.   But her  son wants to continue full scope of treatment.remains full code  Fever: resolved .  UA not impressive for UTI.  Chest x-ray did not show any pneumonia.  Disposition: Patient is from skilled nursing facility.  Plan is to send her back to skilled nursing facility tomorrow.   She had 2 episodes of vomiting after PEG placement last night and this morning.  Its prudent to observe her for next 24 hours before safe discharge.  Nutrition Problem: Inadequate oral intake Etiology: lethargy/confusion      DVT prophylaxis:Heparin [place] Code Status: Full Family Communication: called and discussed with son on phone today Status is: Inpatient  Remains inpatient appropriate because:Unsafe d/c plan   Dispo:  Patient From:  SNF  Planned Disposition: SNF  Expected discharge date: tomorrow  Medically stable for discharge: not yet  If no further vomiting and PEG functions well then she is stable for discharge tomorrow to skilled nursing facility.   Consultants: Neurology  Procedures: PEG placement Antimicrobials:  Anti-infectives (From admission, onward)   Start     Dose/Rate Route Frequency Ordered Stop   03/31/20 1207  vancomycin (VANCOCIN) 1-5 GM/200ML-% IVPB       Note to Pharmacy: Erie Noe   : cabinet override      03/31/20 1207 03/31/20 1232   03/31/20 1200  vancomycin (VANCOCIN) IVPB 1000 mg/200 mL premix        1,000 mg 200 mL/hr over 60 Minutes Intravenous  Once 03/31/20 1155 03/31/20 1509   03/10/20 1200  cefTRIAXone (ROCEPHIN) 2 g in sodium chloride 0.9 % 100 mL IVPB        2 g 200 mL/hr over 30 Minutes Intravenous Every 24 hours 03/10/20 0924 03/14/20 1333   03/09/20 1000  ceFEPIme (MAXIPIME) 1 g in sodium chloride 0.9 % 100 mL IVPB  Status:  Discontinued        1 g 200 mL/hr over 30 Minutes Intravenous Every 24 hours 03/08/20 1219 03/10/20 0924   03/08/20 1219  vancomycin variable dose per unstable renal function (pharmacist dosing)  Status:  Discontinued         Does not apply See admin instructions 03/08/20 1219 03/10/20 0906   03/08/20 1100  vancomycin (VANCOCIN) IVPB 1000 mg/200 mL premix        1,000 mg 200 mL/hr over 60 Minutes Intravenous  Once 03/08/20 1046 03/08/20 1406   03/08/20 1100  ceFEPIme (MAXIPIME) 2 g in sodium chloride 0.9 %  100 mL IVPB        2 g 200 mL/hr over 30 Minutes Intravenous  Once 03/08/20 1046 03/08/20 1211      Subjective:  Patient seen and examined the bedside today.  Hemodynamically stable.  Same as yesterday, weak, lethargic lying on the bed.  There was concern of 2 episodes of vomiting since yesterday.  PEG will be started on slow rate  Objective: Vitals:   03/31/20 2316 04/01/20 0255 04/01/20 0301 04/01/20 0333  BP: 139/67 134/67  (!) 111/55  Pulse: (!) 106 (!) 119  78  Resp: 16 17  17   Temp: 98.9 F (37.2 C) 99 F (37.2 C)  99.1 F (37.3 C)  TempSrc: Oral Oral  Oral  SpO2: 92% 93%  99%  Weight:   59.4 kg   Height:        Intake/Output Summary (Last 24 hours) at 04/01/2020 0830 Last data filed at 04/01/2020 0600 Gross per 24 hour  Intake 0 ml  Output 625 ml  Net -625 ml   Filed Weights   03/23/20 0343 03/24/20 0345 04/01/20 0301  Weight: 78.9 kg 80.3 kg 59.4 kg    Examination:   General exam: Deconditioned, debilitated, chronically ill  looking, weak Respiratory system: Bilateral diminished air sounds on the bases  cardiovascular system: S1 & S2 heard, RRR. No JVD, murmurs, rubs, gallops or clicks. Gastrointestinal system: Abdomen is nondistended, soft and nontender. No organomegaly or masses felt. Normal bowel sounds heard.PEG Central nervous system: Not Alert and oriented. Extremities: No edema, no clubbing ,no cyanosis Skin: No frank rashes, lesions or ulcers,no icterus ,no pallor  Data Reviewed: I have personally reviewed following labs and imaging studies  CBC: Recent Labs  Lab 03/29/20 0120 03/31/20 0434  WBC 12.6* 12.9*  NEUTROABS 8.8* 9.3*  HGB 9.0* 9.3*  HCT 28.1* 28.5*  MCV 89.8 89.3  PLT 316 404*   Basic Metabolic Panel: Recent Labs  Lab 03/26/20 0424 03/29/20 0120 03/31/20 0434  NA 139 138 135  K 3.8 4.1 4.1  CL 105 107 103  CO2 22 23 21*  GLUCOSE 198* 143* 100*  BUN 107* 61* 49*  CREATININE 2.07* 1.52* 1.21*  CALCIUM 9.2 9.1 9.2    GFR: Estimated Creatinine Clearance: 35.8 mL/min (A) (by C-G formula based on SCr of 1.21 mg/dL (H)). Liver Function Tests: No results for input(s): AST, ALT, ALKPHOS, BILITOT, PROT, ALBUMIN in the last 168 hours. No results for input(s): LIPASE, AMYLASE in the last 168 hours. No results for input(s): AMMONIA in the last 168 hours. Coagulation Profile: Recent Labs  Lab 03/31/20 0434  INR 1.1   Cardiac Enzymes: No results for input(s): CKTOTAL, CKMB, CKMBINDEX, TROPONINI in the last 168 hours. BNP (last 3 results) No results for input(s): PROBNP in the last 8760 hours. HbA1C: No results for input(s): HGBA1C in the last 72 hours. CBG: Recent Labs  Lab 03/31/20 0903 03/31/20 1641 03/31/20 1943 03/31/20 2315 04/01/20 0333  GLUCAP 105* 132* 74 98 116*   Lipid Profile: No results for input(s): CHOL, HDL, LDLCALC, TRIG, CHOLHDL, LDLDIRECT in the last 72 hours. Thyroid Function Tests: No results for input(s): TSH, T4TOTAL, FREET4, T3FREE, THYROIDAB in the last 72 hours. Anemia Panel: No results for input(s): VITAMINB12, FOLATE, FERRITIN, TIBC, IRON, RETICCTPCT in the last 72 hours. Sepsis Labs: No results for input(s): PROCALCITON, LATICACIDVEN in the last 168 hours.  No results found for this or any previous visit (from the past 240 hour(s)).       Radiology Studies: IR GASTROSTOMY TUBE MOD SED  Result Date: 03/31/2020 INDICATION: 74 year old female with a history of dysphagia EXAM: PERC PLACEMENT GASTROSTOMY MEDICATIONS: Vancomycin 1 gm IV; Antibiotics were administered within 1 hour of the procedure. ANESTHESIA/SEDATION: Versed 0.5 mg IV; Fentanyl 25 mcg IV Moderate Sedation Time:  10 minutes The patient was continuously monitored during the procedure by the interventional radiology nurse under my direct supervision. CONTRAST:  55mL OMNIPAQUE IOHEXOL 300 MG/ML SOLN - administered into the gastric lumen. FLUOROSCOPY TIME:  Fluoroscopy Time: 2 minutes 54 seconds (6 mGy).  COMPLICATIONS: None PROCEDURE: Informed written consent was obtained from the patient and the patient's family after a thorough discussion of the procedural risks, benefits and alternatives. All questions were addressed. Maximal Sterile Barrier Technique was utilized including caps, mask, sterile gowns, sterile gloves, sterile drape, hand hygiene and skin antiseptic. A timeout was performed prior to the initiation of the procedure. The epigastrium was prepped with Betadine in a sterile fashion, and a sterile drape was applied covering the operative field. A sterile gown and sterile gloves were used for the procedure. A 5-French orogastric tube is placed under fluoroscopic guidance. Scout imaging of the abdomen confirms barium within the transverse colon. The stomach was distended  with gas. Under fluoroscopic guidance, an 18 gauge needle was utilized to puncture the anterior wall of the body of the stomach. An Amplatz wire was advanced through the needle passing a T fastener into the lumen of the stomach. The T fastener was secured for gastropexy. A 9-French sheath was inserted. A snare was advanced through the 9-French sheath. A Teena DunkBenson was advanced through the orogastric tube. It was snared then pulled out the oral cavity, pulling the snare, as well. The leading edge of the gastrostomy was attached to the snare. It was then pulled down the esophagus and out the percutaneous site. Tube secured in place. Contrast was injected. Patient tolerated the procedure well and remained hemodynamically stable throughout. No complications were encountered and no significant blood loss encountered. IMPRESSION: Status post fluoroscopic placed percutaneous gastrostomy tube, with 20 [place] pull-through. Signed, Yvone NeuJaime S. Loreta AveWagner, DO [facility] Electronically Signed   By: Gilmer MorJaime  Wagner D.O.   On: 03/31/2020 12:59        Scheduled Meds: . amLODipine  10 mg Per Tube Daily   . aspirin  81 mg Per Tube Daily  . chlorhexidine  15 mL Mouth Rinse BID  . Chlorhexidine Gluconate Cloth  6 each Topical Daily  . clopidogrel  75 mg Per Tube Daily  . free water  400 mL Per Tube Q6H  . heparin injection (subcutaneous)  5,000 Units Subcutaneous Q8H  . hydrALAZINE  50 mg Per Tube Q8H  . insulin aspart  0-15 Units Subcutaneous TID WC  . insulin aspart  5 Units Subcutaneous Q4H  . insulin detemir  10 Units Subcutaneous BID  . mouth rinse  15 mL Mouth Rinse BID  . metoprolol tartrate  100 mg Per Tube BID  . mirtazapine  7.5 mg Per Tube QHS  . neomycin-bacitracin-polymyxin  1 application Topical Daily  . pantoprazole sodium  40 mg Per Tube Daily  . rosuvastatin  5 mg Per Tube Daily   Continuous Infusions: . feeding supplement (GLUCERNA 1.2 CAL) Stopped (03/31/20 0001)     LOS: 24 days    Time spent:25 mins. More than 50% of that time was spent in counseling and/or coordination of care.      Burnadette PopAmrit Dominiq Fontaine, MD [facility] P8/12/2019, 8:30 AM

## 2020-04-01 NOTE — Progress Notes (Signed)
Notified Dr. Rachael Darby (on call for [facility]) due to patients heart rate increasing throughout the shift since patient has not been receiving her regularly scheduled medicine today.  EKG was ordered and no other new orders given at this time.
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
## 2020-04-02 LAB — CBC WITH DIFFERENTIAL/PLATELET
Abs Immature Granulocytes: 0.21 10*3/uL — ABNORMAL HIGH (ref 0.00–0.07)
Basophils Absolute: 0.1 10*3/uL (ref 0.0–0.1)
Basophils Relative: 1 %
Eosinophils Absolute: 0.6 10*3/uL — ABNORMAL HIGH (ref 0.0–0.5)
Eosinophils Relative: 4 %
HCT: 27.4 % — ABNORMAL LOW (ref 36.0–46.0)
Hemoglobin: 8.5 g/dL — ABNORMAL LOW (ref 12.0–15.0)
Immature Granulocytes: 1 %
Lymphocytes Relative: 10 %
Lymphs Abs: 1.4 10*3/uL (ref 0.7–4.0)
MCH: 28.8 pg (ref 26.0–34.0)
MCHC: 31 g/dL (ref 30.0–36.0)
MCV: 92.9 fL (ref 80.0–100.0)
Monocytes Absolute: 0.7 10*3/uL (ref 0.1–1.0)
Monocytes Relative: 5 %
Neutro Abs: 11.5 10*3/uL — ABNORMAL HIGH (ref 1.7–7.7)
Neutrophils Relative %: 79 %
Platelets: 386 10*3/uL (ref 150–400)
RBC: 2.95 MIL/uL — ABNORMAL LOW (ref 3.87–5.11)
RDW: 15.8 % — ABNORMAL HIGH (ref 11.5–15.5)
WBC: 14.5 10*3/uL — ABNORMAL HIGH (ref 4.0–10.5)
nRBC: 0 % (ref 0.0–0.2)

## 2020-04-02 LAB — BASIC METABOLIC PANEL
Anion gap: 16 — ABNORMAL HIGH (ref 5–15)
BUN: 63 mg/dL — ABNORMAL HIGH (ref 8–23)
CO2: 19 mmol/L — ABNORMAL LOW (ref 22–32)
Calcium: 9.1 mg/dL (ref 8.9–10.3)
Chloride: 103 mmol/L (ref 98–111)
Creatinine, Ser: 1.53 mg/dL — ABNORMAL HIGH (ref 0.44–1.00)
GFR calc Af Amer: 39 mL/min — ABNORMAL LOW (ref >60)
GFR calc non Af Amer: 33 mL/min — ABNORMAL LOW (ref >60)
Glucose, Bld: 195 mg/dL — ABNORMAL HIGH (ref 70–99)
Potassium: 4.1 mmol/L (ref 3.5–5.1)
Sodium: 138 mmol/L (ref 135–145)

## 2020-04-02 LAB — GLUCOSE, CAPILLARY
Glucose-Capillary: 189 mg/dL — ABNORMAL HIGH (ref 70–99)
Glucose-Capillary: 135 mg/dL — ABNORMAL HIGH (ref 70–99)
Glucose-Capillary: 159 mg/dL — ABNORMAL HIGH (ref 70–99)
Glucose-Capillary: 111 mg/dL — ABNORMAL HIGH (ref 70–99)
Glucose-Capillary: 90 mg/dL (ref 70–99)
Glucose-Capillary: 157 mg/dL — ABNORMAL HIGH (ref 70–99)

## 2020-04-02 LAB — SARS CORONAVIRUS 2 BY RT PCR (HOSPITAL ORDER, PERFORMED IN ~~LOC~~ HOSPITAL LAB): SARS Coronavirus 2: NEGATIVE

## 2020-04-02 MED ORDER — GLUCERNA 1.2 CAL PO LIQD: 1000.0000 mL | ORAL | 0 refills | Status: DC

## 2020-04-02 NOTE — Progress Notes (Signed)
PROGRESS NOTE    Tasha [name]  MPN:[id number] DOB: 05/05/1946 DOA: 03/08/2020 PCP: Patient, No Pcp Per   Brief Narrative: Patient is a 74 year old with a history of stroke, DM2, HTN, and MI who was discharged June24,2021 following a stroke work-up and return to the hospital 03/08/2020 with 2 days of intractable nausea and vomiting along with altered mental status. On presentation,She was found to be have  lactic acidosis and acute kidney injury. Following admission the patient improved with supportive care. Due to persisting right-sided weakness which was felt to be worse than previously documented, an MRI was obtained  on 7/18 and showed  an acute left pontine infarct and acute small infarct on the posterior body of the right lateral ventricle. Neurology was following, now signed off.  Stroke work-up completed.  She was on prolonged tube feeding because of severe dysphagia.  SW closely following. She underwent PEG plus placement by IR on  04/02/2020: Patient seen.  Patient is awaiting disposition.  Apparently, the skilled nursing facility that patient came from has several residents that tested positive for Covid, currently on quarantine.  Patient will be discharged when a bed is available.  Assessment & Plan:   Active Problems:   CVA (cerebral vascular accident) (HCC)   DM (diabetes mellitus), type 2 with peripheral vascular complications (HCC)   Essential (primary) hypertension   Lactic acidosis   Nausea and vomiting   AKI (acute kidney injury) (HCC)   Hyperkalemia   Sepsis (HCC)   Coffee ground emesis   Palliative care encounter  Acute CVA  On 7/18 with history of recent CVA June 2021 MRI as above Patient was on clopidogrel 75 mg dailyprior to admission, now on aspirin 81 mg daily and clopidogrel 75 mg daily.Plan is to continue DAPT x 3 weeks again then plavix alone. She is also on crestor. 04/02/2020: Patient will complete 3 weeks of aspirin on 04/06/2020.  Acute kidney  injury Most consistent with prerenal state, w/ probable component of ATN Bun/cr on admission at 149/9.5, improving nicely  Nephrology signed off, nephrology will set up follow-up appointment with Dr. Malen Gauze at [facility] 2 weeks after discharge 04/02/2020: AKI has resolved significantly.    Hypernatremia Resolved.  Continue free water  Acute toxic metabolic encephalopathy Due  lacunaracute CVA,on presentation uremia was also suspected to have contributed to AMS EEGunrevealing TSH normal-B12 and folic acid not deficient Mental status has not significantly improved.  She remains lethargic. Continues to remain feeding tube dependent so she underwent  PEG placement on 03/31/20 04/02/2020: Patient's baseline mentation is unknown to me.  No significant interaction with the patient today.  Patient remains on Glucerna 1.2 55 mils per hour.  SIRS v/s Sepsis- POA Hypothermia, leukocytosis, and lactic acidosis present at admission-no clear source of infection identified-CXR at presentation with atelectasis only-CT abdomen pelvis without acute findings-UA not consistent with UTI and culture without growth-blood cultures no growth x2-was treated with broad-spectrum empiric antibiotic for 7-day course  Intractable nausea vomiting and coffee-ground emesis Hemoccult negative-probable Mallory-Weiss teardue to recurrent vomiting caused by uremia-all anticoagulants/antiplatelet meds held initially but resumed Plavix on 7/17 and aspirin on  7/19  Dysphagia NPO as per  SLP recommendations Continues to remain feeding tube dependent so placed the PEG.  Noninsulin-dependent type II DM 2, controlled, A1c 6.9 Home medication Metformin held Continue current insulin regimen.  Diabetic coordinator was following. 04/02/2020: Blood sugars reasonably controlled.  HTN lopressor to 100 mg twice a day, Norvasc 10 mg daily BP stable  NSVT,  keep mag>2, k>4,   Depression: Family desires  treatment ,on  low-dose Remeron,   Goals of care: Had a long discussion with the son on 03/23/20.  Given her debility, poor quality of life, I offered him palliative care consultation discuss about goals of care.   But her  son wants to continue full scope of treatment.remains full code  Fever: resolved .  UA not impressive for UTI.  Chest x-ray did not show any pneumonia.  Disposition: Patient is from skilled nursing facility.  Plan is to send her back to skilled nursing facility once a bed becomes available.  Currently, patient's skilled nursing facility has some many residents that tested positive for Covid 19.  Nutrition Problem: Inadequate oral intake Etiology: lethargy/confusion      DVT prophylaxis:Heparin [place] Code Status: Full Family Communication: called and discussed with son on phone today Status is: Inpatient  Remains inpatient appropriate because:Unsafe d/c plan  Consultants: Neurology  Procedures: PEG placement Antimicrobials:  Anti-infectives (From admission, onward)   Start     Dose/Rate Route Frequency Ordered Stop   03/31/20 1207  vancomycin (VANCOCIN) 1-5 GM/200ML-% IVPB       Note to Pharmacy: Erie Noe   : cabinet override      03/31/20 1207 03/31/20 1232   03/31/20 1200  vancomycin (VANCOCIN) IVPB 1000 mg/200 mL premix        1,000 mg 200 mL/hr over 60 Minutes Intravenous  Once 03/31/20 1155 03/31/20 1509   03/10/20 1200  cefTRIAXone (ROCEPHIN) 2 g in sodium chloride 0.9 % 100 mL IVPB        2 g 200 mL/hr over 30 Minutes Intravenous Every 24 hours 03/10/20 0924 03/14/20 1333   03/09/20 1000  ceFEPIme (MAXIPIME) 1 g in sodium chloride 0.9 % 100 mL IVPB  Status:  Discontinued        1 g 200 mL/hr over 30 Minutes Intravenous Every 24 hours 03/08/20 1219 03/10/20 0924   03/08/20 1219  vancomycin variable dose per unstable renal function (pharmacist dosing)  Status:  Discontinued         Does not apply See admin instructions 03/08/20 1219 03/10/20 0906    03/08/20 1100  vancomycin (VANCOCIN) IVPB 1000 mg/200 mL premix        1,000 mg 200 mL/hr over 60 Minutes Intravenous  Once 03/08/20 1046 03/08/20 1406   03/08/20 1100  ceFEPIme (MAXIPIME) 2 g in sodium chloride 0.9 % 100 mL IVPB        2 g 200 mL/hr over 30 Minutes Intravenous  Once 03/08/20 1046 03/08/20 1211      Subjective:  Patient seen and examined the bedside today.  Hemodynamically stable.  Same as yesterday, weak, lethargic lying on the bed.  There was concern of 2 episodes of vomiting since yesterday.  PEG will be started on slow rate  Objective: Vitals:   04/02/20 0536 04/02/20 0811 04/02/20 1300 04/02/20 1551  BP: (!) 121/56 (!) 145/59 (!) 116/49 (!) 122/48  Pulse:  95 65 75  Resp:  14 14 20   Temp:  98.1 F (36.7 C) 98 F (36.7 C) 98.5 F (36.9 C)  TempSrc:  Oral Oral Axillary  SpO2:  94% 97% 96%  Weight:      Height:        Intake/Output Summary (Last 24 hours) at 04/02/2020 1825 Last data filed at 04/02/2020 06/02/2020 Gross per 24 hour  Intake 1240 ml  Output 700 ml  Net 540 ml   7116  03/24/20 0345 04/01/20 0301 04/02/20 0346  Weight: 80.3 kg 59.4 kg 55.8 kg    Examination:   General exam: No significant participation from the patient. Respiratory system: Clear to auscultation. cardiovascular system: S1 & S2 heard, RRR Gastrointestinal system: Abdomen is nondistended, soft and nontender. No organomegaly or masses felt. Normal bowel sounds heard.PEG Central nervous system: No significant participation.   Extremities: No edema, no clubbing, no cyanosis  Data Reviewed: I have personally reviewed following labs and imaging studies  CBC: Recent Labs  Lab 03/29/20 0120 03/31/20 0434 04/02/20 0432  WBC 12.6* 12.9* 14.5*  NEUTROABS 8.8* 9.3* 11.5*  HGB 9.0* 9.3* 8.5*  HCT 28.1* 28.5* 27.4*  MCV 89.8 89.3 92.9  PLT 316 404* 386   Basic Metabolic Panel: Recent Labs  Lab 03/29/20 0120 03/31/20 0434 04/02/20 0432  NA 138 135 138  K 4.1 4.1  4.1  CL 107 103 103  CO2 23 21* 19*  GLUCOSE 143* 100* 195*  BUN 61* 49* 63*  CREATININE 1.52* 1.21* 1.53*  CALCIUM 9.1 9.2 9.1   GFR: Estimated Creatinine Clearance: 28.3 mL/min (A) (by C-G formula based on SCr of 1.53 mg/dL (H)). Liver Function Tests: No results for input(s): AST, ALT, ALKPHOS, BILITOT, PROT, ALBUMIN in the last 168 hours. No results for input(s): LIPASE, AMYLASE in the last 168 hours. No results for input(s): AMMONIA in the last 168 hours. Coagulation Profile: Recent Labs  Lab 03/31/20 0434  INR 1.1   Cardiac Enzymes: No results for input(s): CKTOTAL, CKMB, CKMBINDEX, TROPONINI in the last 168 hours. BNP (last 3 results) No results for input(s): PROBNP in the last 8760 hours. HbA1C: No results for input(s): HGBA1C in the last 72 hours. CBG: Recent Labs  Lab 04/01/20 2307 04/02/20 0334 04/02/20 0809 04/02/20 1321 04/02/20 1553  GLUCAP 128* 189* 135* 159* 111*   Lipid Profile: No results for input(s): CHOL, HDL, LDLCALC, TRIG, CHOLHDL, LDLDIRECT in the last 72 hours. Thyroid Function Tests: No results for input(s): TSH, T4TOTAL, FREET4, T3FREE, THYROIDAB in the last 72 hours. Anemia Panel: No results for input(s): VITAMINB12, FOLATE, FERRITIN, TIBC, IRON, RETICCTPCT in the last 72 hours. Sepsis Labs: No results for input(s): PROCALCITON, LATICACIDVEN in the last 168 hours.  Recent Results (from the past 240 hour(s))  SARS Coronavirus 2 by RT PCR (hospital order, [facility]) Nasopharyngeal Nasopharyngeal Swab     Status: None   Collection Time: 04/02/20  1:21 PM   Specimen: Nasopharyngeal Swab  Result Value Ref Range Status   SARS Coronavirus 2 NEGATIVE NEGATIVE Final    Comment: (NOTE) SARS-CoV-2 target nucleic acids are NOT DETECTED.  The SARS-CoV-2 RNA is generally detectable in upper and lower respiratory specimens during the acute phase of infection. The lowest concentration of SARS-CoV-2 viral copies this assay  can detect is 250 copies / mL. A negative result does not preclude SARS-CoV-2 infection and should not be used as the sole basis for treatment or other patient management decisions.  A negative result may occur with improper specimen collection / handling, submission of specimen other than nasopharyngeal swab, presence of viral mutation(s) within the areas targeted by this assay, and inadequate number of viral copies (<250 copies / mL). A negative result must be combined with clinical observations, patient history, and epidemiological information.  Fact Sheet for Patients:   BoilerBrush.com.cyhttps://www.fda.gov/media/136312/download  Fact Sheet for Healthcare Providers: https://pope.com/https://www.fda.gov/media/136313/download  This test is not yet approved or  cleared by the [place] FDA and has been authorized  for detection and/or diagnosis of SARS-CoV-2 by FDA under an Emergency Use Authorization (EUA).  This EUA will remain in effect (meaning this test can be used) for the duration of the COVID-19 declaration under Section 564(b)(1) of the Act, 21 U.S.C. section 360bbb-3(b)(1), unless the authorization is terminated or revoked sooner.  Performed at [facility], [address] [place], [place] [address]          Radiology Studies: No results found.      Scheduled Meds: . amLODipine  10 mg Per Tube Daily  . aspirin  81 mg Per Tube Daily  . chlorhexidine  15 mL Mouth Rinse BID  . Chlorhexidine Gluconate Cloth  6 each Topical Daily  . clopidogrel  75 mg Per Tube Daily  . free water  400 mL Per Tube Q6H  . heparin injection (subcutaneous)  5,000 Units Subcutaneous Q8H  . hydrALAZINE  50 mg Per Tube Q8H  . insulin aspart  0-15 Units Subcutaneous TID WC  . insulin aspart  5 Units Subcutaneous Q4H  . insulin detemir  10 Units Subcutaneous BID  . mouth rinse  15 mL Mouth Rinse BID  . metoprolol tartrate  100 mg Per Tube BID  . mirtazapine  7.5 mg Per Tube QHS  .  neomycin-bacitracin-polymyxin  1 application Topical Daily  . pantoprazole sodium  40 mg Per Tube Daily  . rosuvastatin  5 mg Per Tube Daily   Continuous Infusions: . feeding supplement (GLUCERNA 1.2 CAL) 55 mL/hr at 04/01/20 1546     LOS: 25 days    Time spent:25 mins. More than 50% of that time was spent in counseling and/or coordination of care.   Barnetta Chapel, MD [facility] P8/01/2020, 6:25 PM

## 2020-04-02 NOTE — Progress Notes (Signed)
  Speech Language Pathology Treatment: Dysphagia  Patient Details Name: Tasha [name] MRN: [id number] DOB: [birth year] Today's Date: 04/02/2020 Time: 2876-8115 SLP Time Calculation (min) (ACUTE ONLY): 10 min  Assessment / Plan / Recommendation Clinical Impression  Saw pt for ongoing swallowing therapy this date.  Pt had mild amount of dark dried secretions coating lingual surface and oral mucosa on SLP arrival.  Oral care provided.  With PO trials of ice and thin liquid x5, pt exhibited weak, ineffective oral response, but did consistently exhibit response.  There was no mastication or transit of ice chips.  There was significant anterior spillage of small amounts of water by spoon.  Pt had no appreciable oral transit of of bolus trials.Suction was used following each trial to remove any residuals for oral cavity.  There was no pharyngeal swallow reflex.  Pt is not appropriate for further evaluation at this time.  Consider thermal stimulation to improve strength oral response.   HPI HPI: Tasha [name] is a 74 y.o. female with history of stroke in June, HTN, MI and DM presenting 7/12 with nausea and vomiting, altered mental status, admitted w/ uremia and possible infection. In hospital, R sided weakness not improving, MRI showed acute left pontine infarction. Acute small infarct along the posterior body of the right lateral ventricle. Seen for swallowing during admission 02/17/20 >nectar thick then upgraded to thin/full liquids. Pt now has PEG tube for nutrition.      SLP Plan  Continue with current plan of care       Recommendations  Diet recommendations: NPO Medication Administration: Via alternative means                Oral Care Recommendations: Oral care QID Follow up Recommendations: Skilled Nursing facility;24 hour supervision/assistance SLP Visit Diagnosis: Dysphagia, unspecified (R13.10) Plan: Continue with current plan of care       GO                Kerrie Pleasure, MA,  CCC-SLP Acute Rehabilitation Services Office: [phone]  04/02/2020, 10:06 AM

## 2020-04-02 NOTE — TOC Progression Note (Signed)
Transition of Care [facility]) - Progression Note    Patient Details  Name: Tasha [name] MRN: [id number] Date of Birth: 1946-08-08  Transition of Care [facility]) CM/SW Contact  Baldemar Lenis, [place] Phone Number: 04/02/2020, 4:02 PM  Clinical Narrative:   CSW coordinated to try to get patient back to SNF today. [facility] does not have patient's tube feeding in stock, would have to order and wouldn't get in until Monday. Attempted to reach RD to discuss changing the tube feeding, but was unable to reach them. CSW spoke with RN and pharmacy, and asked [facility] to fill the tube feeds and send with her. CSW spoke with son and he was in agreement. CSW then notified by [facility] that they will not have a bed for the patient until Monday. CSW updated team about barriers to discharge and canceled tube feed order. CSW to follow.    Expected Discharge Plan: Skilled Nursing Facility Barriers to Discharge: Continued Medical Work up  Expected Discharge Plan and Services Expected Discharge Plan: Skilled Nursing Facility     Post Acute Care Choice: Nursing Home, Resumption of Svcs/PTA Provider   Expected Discharge Date:  (unknown)                                     Social Determinants of Health (SDOH) Interventions    Readmission Risk Interventions No flowsheet data found.

## 2020-04-02 NOTE — Plan of Care (Signed)

## 2020-04-02 NOTE — Progress Notes (Signed)
Physical Therapy Treatment Patient Details Name: Tasha [name] MRN: [id number] DOB: Jun 28, 1946 Today's Date: 04/02/2020    History of Present Illness Elberta Lachapelle is a 74 y.o. 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 In hospital, R sided weakness not improving, MRI showed new acute infarcts of L pontine and post body R lateral ventricle. Pt had Gtube placed on 04/01/2020    PT Comments    Pt is very slowly progressing towards goals. Pt required +2 total assist for all bed mobility. Pt continues to present with inability to follow commands and R side gaze preference. Occasionally responded verbally to simple commands; this seemed to be the only purposeful response. Given current deficits, continue with d/c plans for SNF. Will continue to keep patient on acute therapy caseload.    Follow Up Recommendations  SNF     Equipment Recommendations  None recommended by PT    Recommendations for Other Services       Precautions / Restrictions Precautions Precautions: Fall Precaution Comments: right hemiplegia, coretrack Required Braces or Orthoses: Splint/Cast Splint/Cast: bil resting hand splints- night wear only Restrictions Weight Bearing Restrictions: No    Mobility  Bed Mobility Overal bed mobility: Needs Assistance Bed Mobility: Supine to Sit;Sit to Supine     Supine to sit: Total assist;+2 for physical assistance;+2 for safety/equipment Sit to supine: Total assist;+2 for physical assistance;+2 for safety/equipment   General bed mobility comments: pt with R sided gaze, she required +2 total A for supine to sit and sit to supine  Transfers         Ambulation/Gait         Stairs        Wheelchair Mobility    Modified Rankin (Stroke Patients Only) Modified Rankin (Stroke Patients Only) Pre-Morbid Rankin Score: Severe disability Modified  Rankin: Severe disability     Balance Overall balance assessment: Needs assistance Sitting-balance support: Feet supported;Bilateral upper extremity supported Sitting balance-Leahy Scale: Zero Sitting balance - Comments: Pt required total assist for sitting balance at EOB. Pt unable to maintain posture with externally forced wt shifting            Cognition Arousal/Alertness: Lethargic Behavior During Therapy: Flat affect Overall Cognitive Status: Difficult to assess Area of Impairment: Attention;Memory;Following commands;Awareness          Current Attention Level: Focused Memory: Decreased short-term memory Following Commands: Follows one step commands inconsistently   Awareness: Intellectual Problem Solving: Slow processing;Decreased initiation;Difficulty sequencing;Requires verbal cues;Requires tactile cues General Comments: Pt able to follow command to squeeze PT hand with LUE and follow PT with eyes. Pt preferred R side gaze. She was emotional with today's session with noted crying. pt with occasional one word verbal responses when asked simple questions. Pt unable to respond to any cue to perform with physical tasks      Exercises General Exercises - Lower Extremity Ankle Circles/Pumps: PROM;10 reps;Supine;Both Quad Sets: PROM;10 reps;Both;Supine Heel Slides: PROM;Both;15 reps;Supine (noted to have increased tone in L quad musculature) Shoulder Exercises Neck Extension: PROM;Seated (2 x 30sec stretch holds) Hand Exercises Wrist Extension: PROM;Left;10 reps;Seated Other Exercises Other Exercises: trunk lateral wt shifting x 10 each side Other Exercises: trunk anterior/posterior wt shifting x 10 each side Other Exercises: passive trunk rotation with passive neck rotation in sitting x 10 each side Other Exercises: Ankle DF stretch 3 x 30 sec each side Other Exercises: Passive Knee  extension x 10 each side    General Comments        Pertinent Vitals/Pain Pain  Assessment: Faces Faces Pain Scale: No hurt    Home Living             Prior Function            PT Goals (current goals can now be found in the care plan section) Acute Rehab PT Goals Patient Stated Goal: Unable to state PT Goal Formulation: Patient unable to participate in goal setting Time For Goal Achievement: 04/09/20 Potential to Achieve Goals: Poor Progress towards PT goals: Not progressing toward goals - comment (Pt still noted to require +2 total assist)    Frequency    Min 3X/week      PT Plan Current plan remains appropriate    Co-evaluation              AM-PAC PT "6 Clicks" Mobility   Outcome Measure  Help needed turning from your back to your side while in a flat bed without using bedrails?: Total Help needed moving from lying on your back to sitting on the side of a flat bed without using bedrails?: Total Help needed moving to and from a bed to a chair (including a wheelchair)?: Total Help needed standing up from a chair using your arms (e.g., wheelchair or bedside chair)?: Total Help needed to walk in hospital room?: Total Help needed climbing 3-5 steps with a railing? : Total 6 Click Score: 6    End of Session   Activity Tolerance: Patient limited by lethargy Patient left: in bed;with bed alarm set;with call Gloriann Riede/phone within reach Nurse Communication: Mobility status PT Visit Diagnosis: Hemiplegia and hemiparesis;Other symptoms and signs involving the nervous system (R29.898);History of falling (Z91.81);Muscle weakness (generalized) (M62.81) Hemiplegia - Right/Left: Right Hemiplegia - dominant/non-dominant: Dominant Hemiplegia - caused by: Cerebral infarction     Time: 1208-1232 PT Time Calculation (min) (ACUTE ONLY): 24 min  Charges:  $Therapeutic Exercise: 8-22 mins $Therapeutic Activity: 8-22 mins                    Harmon Pier, SPT  Acute Rehabilitation Services  Office: [phone]  04/02/2020, 3:12 PM

## 2020-04-03 LAB — GLUCOSE, CAPILLARY
Glucose-Capillary: 146 mg/dL — ABNORMAL HIGH (ref 70–99)
Glucose-Capillary: 161 mg/dL — ABNORMAL HIGH (ref 70–99)
Glucose-Capillary: 177 mg/dL — ABNORMAL HIGH (ref 70–99)
Glucose-Capillary: 125 mg/dL — ABNORMAL HIGH (ref 70–99)
Glucose-Capillary: 145 mg/dL — ABNORMAL HIGH (ref 70–99)
Glucose-Capillary: 208 mg/dL — ABNORMAL HIGH (ref 70–99)

## 2020-04-03 NOTE — Progress Notes (Signed)
PROGRESS NOTE    Tasha [name]  VPX:[id number] DOB: [birth year] DOA: 03/08/2020 PCP: Patient, No Pcp Per   Brief Narrative: Patient is a 74 year old with a history of stroke, DM2, HTN, and MI who was discharged June24,2021 following a stroke work-up and return to the hospital 03/08/2020 with 2 days of intractable nausea and vomiting along with altered mental status. On presentation,She was found to be have  lactic acidosis and acute kidney injury. Following admission the patient improved with supportive care. Due to persisting right-sided weakness which was felt to be worse than previously documented, an MRI was obtained  on 7/18 and showed  an acute left pontine infarct and acute small infarct on the posterior body of the right lateral ventricle. Neurology was following, now signed off.  Stroke work-up completed.  She was on prolonged tube feeding because of severe dysphagia.  SW closely following. She underwent PEG plus placement by IR on  04/02/2020: Patient seen.  Patient is awaiting disposition.  Apparently, the skilled nursing facility that patient came from has several residents that tested positive for Covid, currently on quarantine.  Patient will be discharged when a bed is available.  04/03/2020: Patient seen.  Patient is more awake today, but not communicative.  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  Patient is awaiting discharge back to the skilled nursing facility.  Assessment & Plan:   Active Problems:   CVA (cerebral vascular accident) (HCC)   DM (diabetes mellitus), type 2 with peripheral vascular complications (HCC)   Essential (primary) hypertension   Lactic acidosis   Nausea and vomiting   AKI (acute kidney injury) (HCC)   Hyperkalemia   Sepsis (HCC)   Coffee ground emesis   Palliative care encounter  Acute CVA  On 7/18 with history of recent CVA June 2021 MRI as above Patient was on clopidogrel 75 mg dailyprior to admission, now on aspirin 81 mg daily and  clopidogrel 75 mg daily.Plan is to continue DAPT x 3 weeks again then plavix alone. She is also on crestor. 04/03/2020: Patient will complete 3 weeks of aspirin on 04/06/2020.  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 Malen Gauze at [facility] 2 weeks after discharge 04/03/2020: Patient has baseline chronic kidney disease stage III with baseline serum creatinine of 0.99.  Acute kidney injury has improved significantly.  Continue to monitor renal function and electrolytes.      Hypernatremia Resolved.  Continue free water  Acute toxic metabolic encephalopathy Due  lacunaracute CVA,on presentation uremia was also suspected to have contributed to AMS EEGunrevealing TSH normal-B12 and folic acid not deficient Mental status has not significantly improved.  She remains lethargic. Continues to remain feeding tube dependent so she underwent  PEG placement on 03/31/20 04/03/2020: Patient's baseline mentation is unknown to me.  Patient seems to be improving.    SIRS v/s Sepsis- POA Hypothermia, leukocytosis, and lactic acidosis present at admission-no clear source of infection identified-CXR at presentation with atelectasis only-CT abdomen pelvis without acute findings-UA not consistent with UTI and culture without growth-blood cultures no growth x2-was treated with broad-spectrum empiric antibiotic for 7-day course  Intractable nausea vomiting and coffee-ground emesis Hemoccult negative-probable Mallory-Weiss teardue to recurrent vomiting caused by uremia-all anticoagulants/antiplatelet meds held initially but resumed Plavix on 7/17 and aspirin on  7/19  Dysphagia NPO as per  SLP recommendations Continues to remain  feeding tube dependent so placed the PEG.  Noninsulin-dependent type II DM 2,  controlled, A1c 6.9 Home medication Metformin held Continue current insulin regimen.  Diabetic coordinator was following. 04/03/2020: Blood sugars reasonably controlled.  HTN lopressor to 100 mg twice a day, Norvasc 10 mg daily BP stable  NSVT, keep mag>2, k>4,   Depression: Family desires treatment ,on  low-dose Remeron,   Goals of care:  Hospital prior documentation: "Had a long discussion with the son on 03/23/20.  Given her debility, poor quality of life, I offered him palliative care consultation discuss about goals of care.   But her  son wants to continue full scope of treatment.remains full code"  Fever: resolved .  UA not impressive for UTI.  Chest x-ray did not show any pneumonia.  Disposition: Patient is from skilled nursing facility.  Plan is to send her back to skilled nursing facility once a bed becomes available.  Currently, patient's skilled nursing facility has some many residents that tested positive for Covid 19.  Nutrition Problem: Inadequate oral intake Etiology: lethargy/confusion      DVT prophylaxis:Heparin Reese Code Status: Full Family Communication: called and discussed with son on phone today Status is: Inpatient  Remains inpatient appropriate because:Unsafe d/c plan  Consultants: Neurology  Procedures: PEG placement Antimicrobials:  Anti-infectives (From admission, onward)   Start     Dose/Rate Route Frequency Ordered Stop   03/31/20 1207  vancomycin (VANCOCIN) 1-5 GM/200ML-% IVPB       Note to Pharmacy: Erie Noe   : cabinet override      03/31/20 1207 03/31/20 1232   03/31/20 1200  vancomycin (VANCOCIN) IVPB 1000 mg/200 mL premix        1,000 mg 200 mL/hr over 60 Minutes Intravenous  Once 03/31/20 1155 03/31/20 1509   03/10/20 1200  cefTRIAXone (ROCEPHIN) 2 g in sodium chloride 0.9 % 100 mL IVPB        2 g 200 mL/hr over 30 Minutes Intravenous Every 24 hours 03/10/20 0924 03/14/20 1333   03/09/20 1000  ceFEPIme (MAXIPIME) 1 g in sodium  chloride 0.9 % 100 mL IVPB  Status:  Discontinued        1 g 200 mL/hr over 30 Minutes Intravenous Every 24 hours 03/08/20 1219 03/10/20 0924   03/08/20 1219  vancomycin variable dose per unstable renal function (pharmacist dosing)  Status:  Discontinued         Does not apply See admin instructions 03/08/20 1219 03/10/20 0906   03/08/20 1100  vancomycin (VANCOCIN) IVPB 1000 mg/200 mL premix        1,000 mg 200 mL/hr over 60 Minutes Intravenous  Once 03/08/20 1046 03/08/20 1406   03/08/20 1100  ceFEPIme (MAXIPIME) 2 g in sodium chloride 0.9 % 100 mL IVPB        2 g 200 mL/hr over 30 Minutes Intravenous  Once 03/08/20 1046 03/08/20 1211      Subjective:  Patient seen  Patient is better today. No history from patient.   Objective: Vitals:   04/02/20 2324 04/03/20 0213 04/03/20 0328 04/03/20 0805  BP: 138/75  (!) 141/62 (!) 134/52  Pulse: 74  67 73  Resp: 18  16 18   Temp: 99.6 F (37.6 C)  99.4 F (37.4 C) 99.9 F (37.7 C)  TempSrc: Oral  Oral Axillary  SpO2: 96%  95% 96%  Weight:  59.4 kg    Height:        Intake/Output Summary (Last 24 hours) at 04/03/2020 1050  Last data filed at 04/03/2020 0748 Gross per 24 hour  Intake 2346.42 ml  Output --  Net 2346.42 ml   Filed Weights   04/01/20 0301 04/02/20 0346 04/03/20 0213  Weight: 59.4 kg 55.8 kg 59.4 kg    Examination:   General exam: No significant history from the patient. Respiratory system: Clear to auscultation. cardiovascular system: S1 & S2, systolic murmur.   Gastrointestinal system: Abdomen is nondistended, soft and nontender. No organomegaly or masses felt. Normal bowel sounds heard.PEG Central nervous system: Facial asymmetry with right-sided hemiplegia plegia.   Extremities: No edema, no clubbing, no cyanosis  Data Reviewed: I have personally reviewed following labs and imaging studies  CBC: Recent Labs  Lab 03/29/20 0120 03/31/20 0434 04/02/20 0432  WBC 12.6* 12.9* 14.5*  NEUTROABS 8.8* 9.3*  11.5*  HGB 9.0* 9.3* 8.5*  HCT 28.1* 28.5* 27.4*  MCV 89.8 89.3 92.9  PLT 316 404* 386   Basic Metabolic Panel: Recent Labs  Lab 03/29/20 0120 03/31/20 0434 04/02/20 0432  NA 138 135 138  K 4.1 4.1 4.1  CL 107 103 103  CO2 23 21* 19*  GLUCOSE 143* 100* 195*  BUN 61* 49* 63*  CREATININE 1.52* 1.21* 1.53*  CALCIUM 9.1 9.2 9.1   GFR: Estimated Creatinine Clearance: 28.3 mL/min (A) (by C-G formula based on SCr of 1.53 mg/dL (H)). Liver Function Tests: No results for input(s): AST, ALT, ALKPHOS, BILITOT, PROT, ALBUMIN in the last 168 hours. No results for input(s): LIPASE, AMYLASE in the last 168 hours. No results for input(s): AMMONIA in the last 168 hours. Coagulation Profile: Recent Labs  Lab 03/31/20 0434  INR 1.1   Cardiac Enzymes: No results for input(s): CKTOTAL, CKMB, CKMBINDEX, TROPONINI in the last 168 hours. BNP (last 3 results) No results for input(s): PROBNP in the last 8760 hours. HbA1C: No results for input(s): HGBA1C in the last 72 hours. CBG: Recent Labs  Lab 04/02/20 1553 04/02/20 1936 04/02/20 2322 04/03/20 0325 04/03/20 0804  GLUCAP 111* 90 157* 146* 161*   Lipid Profile: No results for input(s): CHOL, HDL, LDLCALC, TRIG, CHOLHDL, LDLDIRECT in the last 72 hours. Thyroid Function Tests: No results for input(s): TSH, T4TOTAL, FREET4, T3FREE, THYROIDAB in the last 72 hours. Anemia Panel: No results for input(s): VITAMINB12, FOLATE, FERRITIN, TIBC, IRON, RETICCTPCT in the last 72 hours. Sepsis Labs: No results for input(s): PROCALCITON, LATICACIDVEN in the last 168 hours.  Recent Results (from the past 240 hour(s))  SARS Coronavirus 2 by RT PCR (hospital order[facility]) Nasopharyngeal Nasopharyngeal Swab     Status: None   Collection Time: 04/02/20  1:21 PM   Specimen: Nasopharyngeal Swab  Result Value Ref Range Status   SARS Coronavirus 2 NEGATIVE NEGATIVE Final    Comment: (NOTE) SARS-CoV-2 target nucleic  acids are NOT DETECTED.  The SARS-CoV-2 RNA is generally detectable in upper and lower respiratory specimens during the acute phase of infection. The lowest concentration of SARS-CoV-2 viral copies this assay can detect is 250 copies / mL. A negative result does not preclude SARS-CoV-2 infection and should not be used as the sole basis for treatment or other patient management decisions.  A negative result may occur with improper specimen collection / handling, submission of specimen other than nasopharyngeal swab, presence of viral mutation(s) within the areas targeted by this assay, and inadequate number of viral copies (<250 copies / mL). A negative result must be combined with clinical observations, patient history, and epidemiological information.  Fact  Sheet for Patients:   BoilerBrush.com.cy  Fact Sheet for Healthcare Providers: https://pope.com/  This test is not yet approved or  cleared by the [place] FDA and has been authorized for detection and/or diagnosis of SARS-CoV-2 by FDA under an Emergency Use Authorization (EUA).  This EUA will remain in effect (meaning this test can be used) for the duration of the COVID-19 declaration under Section 564(b)(1) of the Act, 21 U.S.C. section 360bbb-3(b)(1), unless the authorization is terminated or revoked sooner.  Performed at [facility], [address] [place], [place] [address]          Radiology Studies: No results found.      Scheduled Meds: . amLODipine  10 mg Per Tube Daily  . aspirin  81 mg Per Tube Daily  . chlorhexidine  15 mL Mouth Rinse BID  . Chlorhexidine Gluconate Cloth  6 each Topical Daily  . clopidogrel  75 mg Per Tube Daily  . free water  400 mL Per Tube Q6H  . heparin injection (subcutaneous)  5,000 Units Subcutaneous Q8H  . hydrALAZINE  50 mg Per Tube Q8H  . insulin aspart  0-15 Units Subcutaneous TID WC  . insulin aspart  5 Units  Subcutaneous Q4H  . insulin detemir  10 Units Subcutaneous BID  . mouth rinse  15 mL Mouth Rinse BID  . metoprolol tartrate  100 mg Per Tube BID  . mirtazapine  7.5 mg Per Tube QHS  . neomycin-bacitracin-polymyxin  1 application Topical Daily  . pantoprazole sodium  40 mg Per Tube Daily  . rosuvastatin  5 mg Per Tube Daily   Continuous Infusions: . feeding supplement (GLUCERNA 1.2 CAL) 1,000 mL (04/03/20 0748)     LOS: 26 days   Time spent:25 mins.   Barnetta Chapel, MD [facility] P8/02/2020, 10:50 AM

## 2020-04-04 ENCOUNTER — Inpatient Hospital Stay (HOSPITAL_COMMUNITY): Payer: Medicare Other

## 2020-04-04 DIAGNOSIS — J189 Pneumonia, unspecified organism: Secondary | ICD-10-CM

## 2020-04-04 DIAGNOSIS — N390 Urinary tract infection, site not specified: Secondary | ICD-10-CM

## 2020-04-04 LAB — OSMOLALITY: Osmolality: 292 mOsm/kg (ref 275–295)

## 2020-04-04 LAB — GLUCOSE, CAPILLARY
Glucose-Capillary: 228 mg/dL — ABNORMAL HIGH (ref 70–99)
Glucose-Capillary: 213 mg/dL — ABNORMAL HIGH (ref 70–99)
Glucose-Capillary: 173 mg/dL — ABNORMAL HIGH (ref 70–99)
Glucose-Capillary: 91 mg/dL (ref 70–99)
Glucose-Capillary: 113 mg/dL — ABNORMAL HIGH (ref 70–99)
Glucose-Capillary: 143 mg/dL — ABNORMAL HIGH (ref 70–99)
Glucose-Capillary: 132 mg/dL — ABNORMAL HIGH (ref 70–99)

## 2020-04-04 LAB — RENAL FUNCTION PANEL
Albumin: 2.5 g/dL — ABNORMAL LOW (ref 3.5–5.0)
Anion gap: 15 (ref 5–15)
BUN: 63 mg/dL — ABNORMAL HIGH (ref 8–23)
CO2: 20 mmol/L — ABNORMAL LOW (ref 22–32)
Calcium: 9.7 mg/dL (ref 8.9–10.3)
Chloride: 93 mmol/L — ABNORMAL LOW (ref 98–111)
Creatinine, Ser: 1.42 mg/dL — ABNORMAL HIGH (ref 0.44–1.00)
GFR calc Af Amer: 42 mL/min — ABNORMAL LOW (ref >60)
GFR calc non Af Amer: 37 mL/min — ABNORMAL LOW (ref >60)
Glucose, Bld: 219 mg/dL — ABNORMAL HIGH (ref 70–99)
Phosphorus: 3.7 mg/dL (ref 2.5–4.6)
Potassium: 4.7 mmol/L (ref 3.5–5.1)
Sodium: 128 mmol/L — ABNORMAL LOW (ref 135–145)

## 2020-04-04 LAB — CBC WITH DIFFERENTIAL/PLATELET
Abs Immature Granulocytes: 0.47 10*3/uL — ABNORMAL HIGH (ref 0.00–0.07)
Basophils Absolute: 0.1 10*3/uL (ref 0.0–0.1)
Basophils Relative: 1 %
Eosinophils Absolute: 0.2 10*3/uL (ref 0.0–0.5)
Eosinophils Relative: 1 %
HCT: 30.6 % — ABNORMAL LOW (ref 36.0–46.0)
Hemoglobin: 10.1 g/dL — ABNORMAL LOW (ref 12.0–15.0)
Immature Granulocytes: 3 %
Lymphocytes Relative: 8 %
Lymphs Abs: 1.4 10*3/uL (ref 0.7–4.0)
MCH: 29.4 pg (ref 26.0–34.0)
MCHC: 33 g/dL (ref 30.0–36.0)
MCV: 89 fL (ref 80.0–100.0)
Monocytes Absolute: 0.8 10*3/uL (ref 0.1–1.0)
Monocytes Relative: 4 %
Neutro Abs: 15.3 10*3/uL — ABNORMAL HIGH (ref 1.7–7.7)
Neutrophils Relative %: 83 %
Platelets: 637 10*3/uL — ABNORMAL HIGH (ref 150–400)
RBC: 3.44 MIL/uL — ABNORMAL LOW (ref 3.87–5.11)
RDW: 15.3 % (ref 11.5–15.5)
WBC: 18.2 10*3/uL — ABNORMAL HIGH (ref 4.0–10.5)
nRBC: 0 % (ref 0.0–0.2)

## 2020-04-04 LAB — URINALYSIS, ROUTINE W REFLEX MICROSCOPIC
Bilirubin Urine: NEGATIVE
Glucose, UA: NEGATIVE mg/dL
Ketones, ur: NEGATIVE mg/dL
Nitrite: NEGATIVE
Protein, ur: 100 mg/dL — AB
RBC / HPF: >50 RBC/hpf — ABNORMAL HIGH (ref 0–5)
Specific Gravity, Urine: 1.009 (ref 1.005–1.030)
pH: 9 — ABNORMAL HIGH (ref 5.0–8.0)

## 2020-04-04 LAB — MAGNESIUM: Magnesium: 2 mg/dL (ref 1.7–2.4)

## 2020-04-04 LAB — SODIUM, URINE, RANDOM: Sodium, Ur: 71 mmol/L

## 2020-04-04 LAB — PROCALCITONIN: Procalcitonin: 0.84 ng/mL

## 2020-04-04 LAB — OSMOLALITY, URINE: Osmolality, Ur: 339 mOsm/kg (ref 300–900)

## 2020-04-04 IMAGING — DX DG CHEST 1V PORT
1 series · 1 of 1 positions shown · non-contrast
Comparison: [DATE]

CLINICAL DATA: Fever and vomiting.

EXAM:
PORTABLE CHEST 1 VIEW

[chest ap]
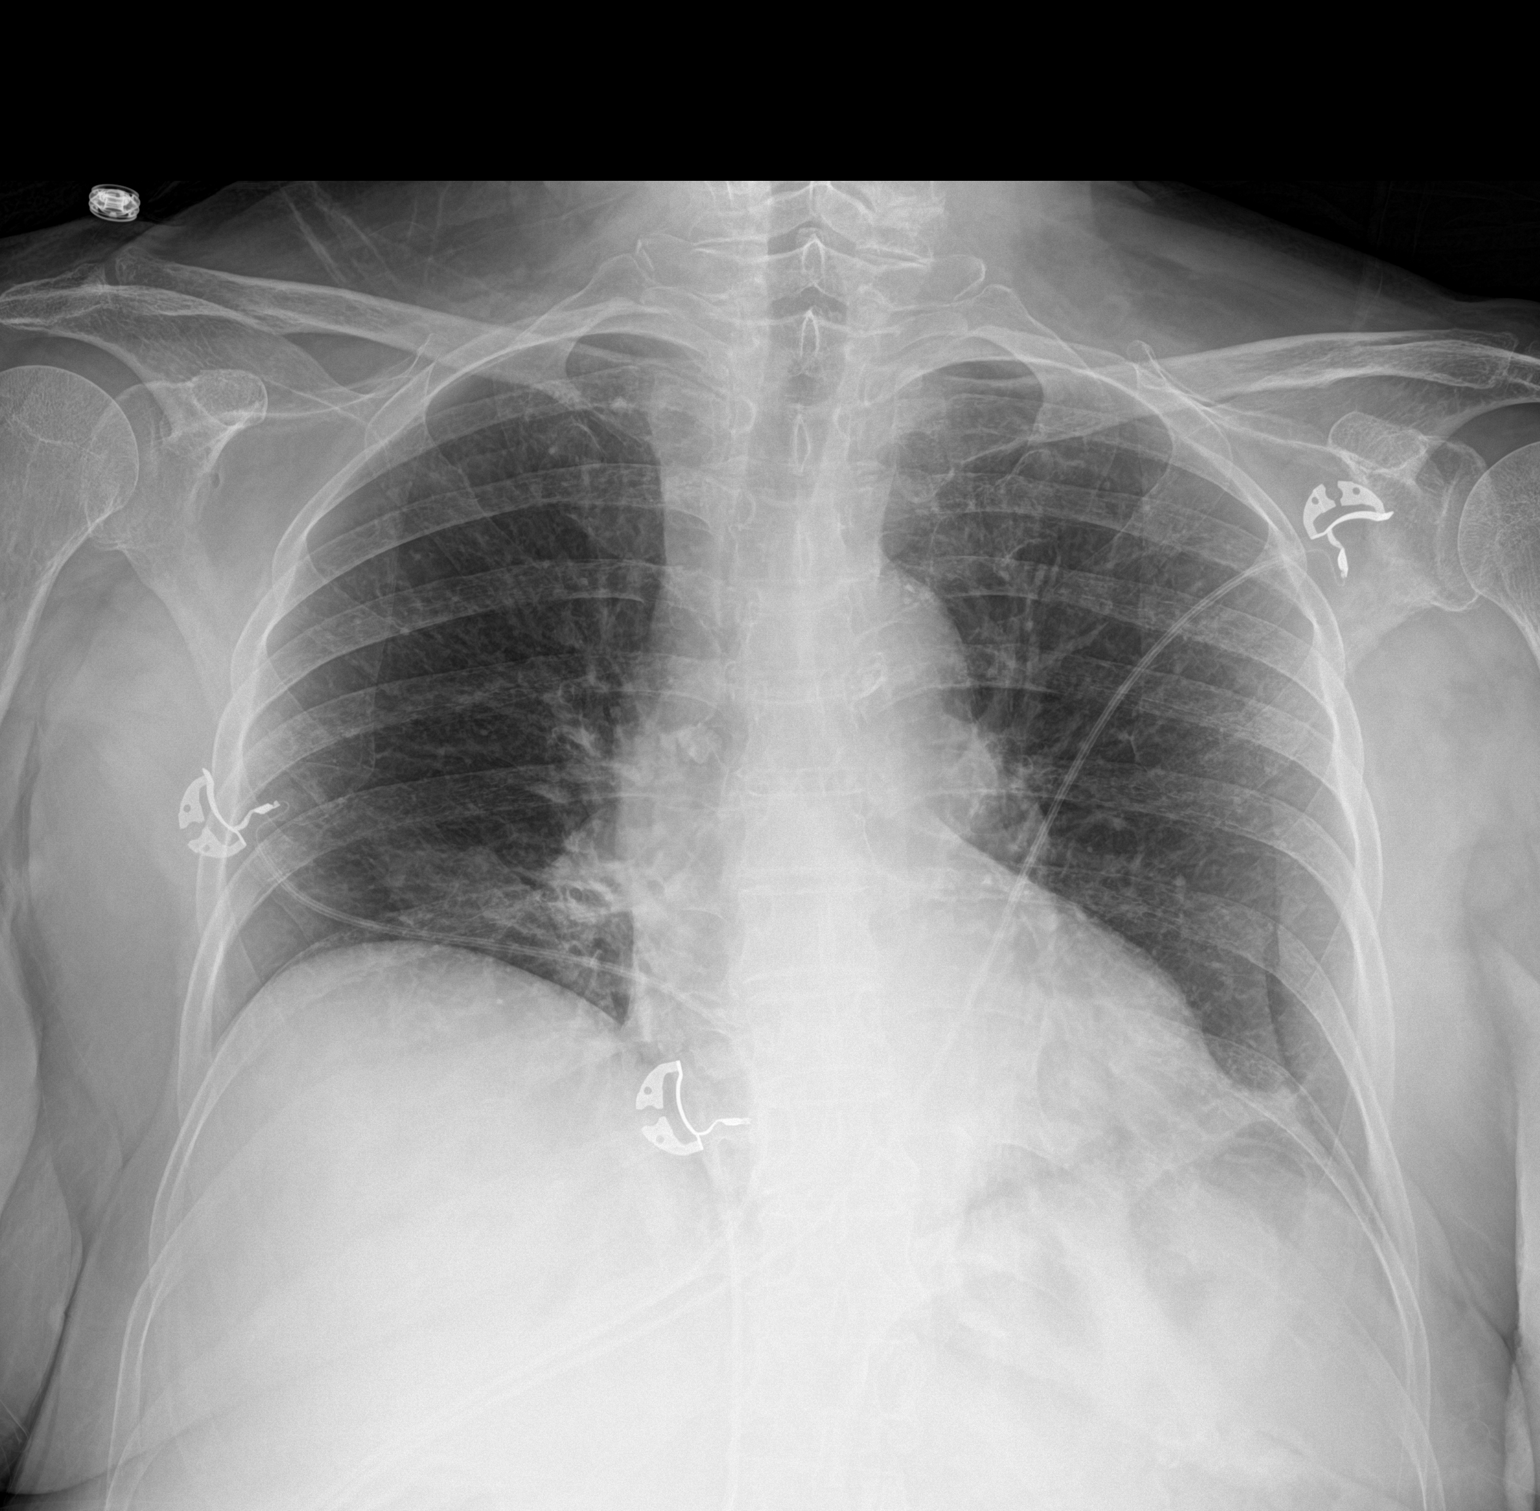

[1 of 1 positions shown; findings below may reference images not displayed]

FINDINGS: Feeding tube has been removed. Stable elevation of the RIGHT
hemidiaphragm. There is minimal subsegmental atelectasis at the LEFT
lung base. No consolidations or pleural effusions. No pulmonary
edema.
IMPRESSION: Minimal LEFT lower lobe atelectasis.

## 2020-04-04 IMAGING — DX DG ABDOMEN 1V
2 series · 2 of 2 positions shown · non-contrast
Comparison: [DATE] abdominal radiograph

CLINICAL DATA: Abdominal distension, vomiting, fever

EXAM:
ABDOMEN - 1 VIEW

[abdomen kub (1 of 2)]
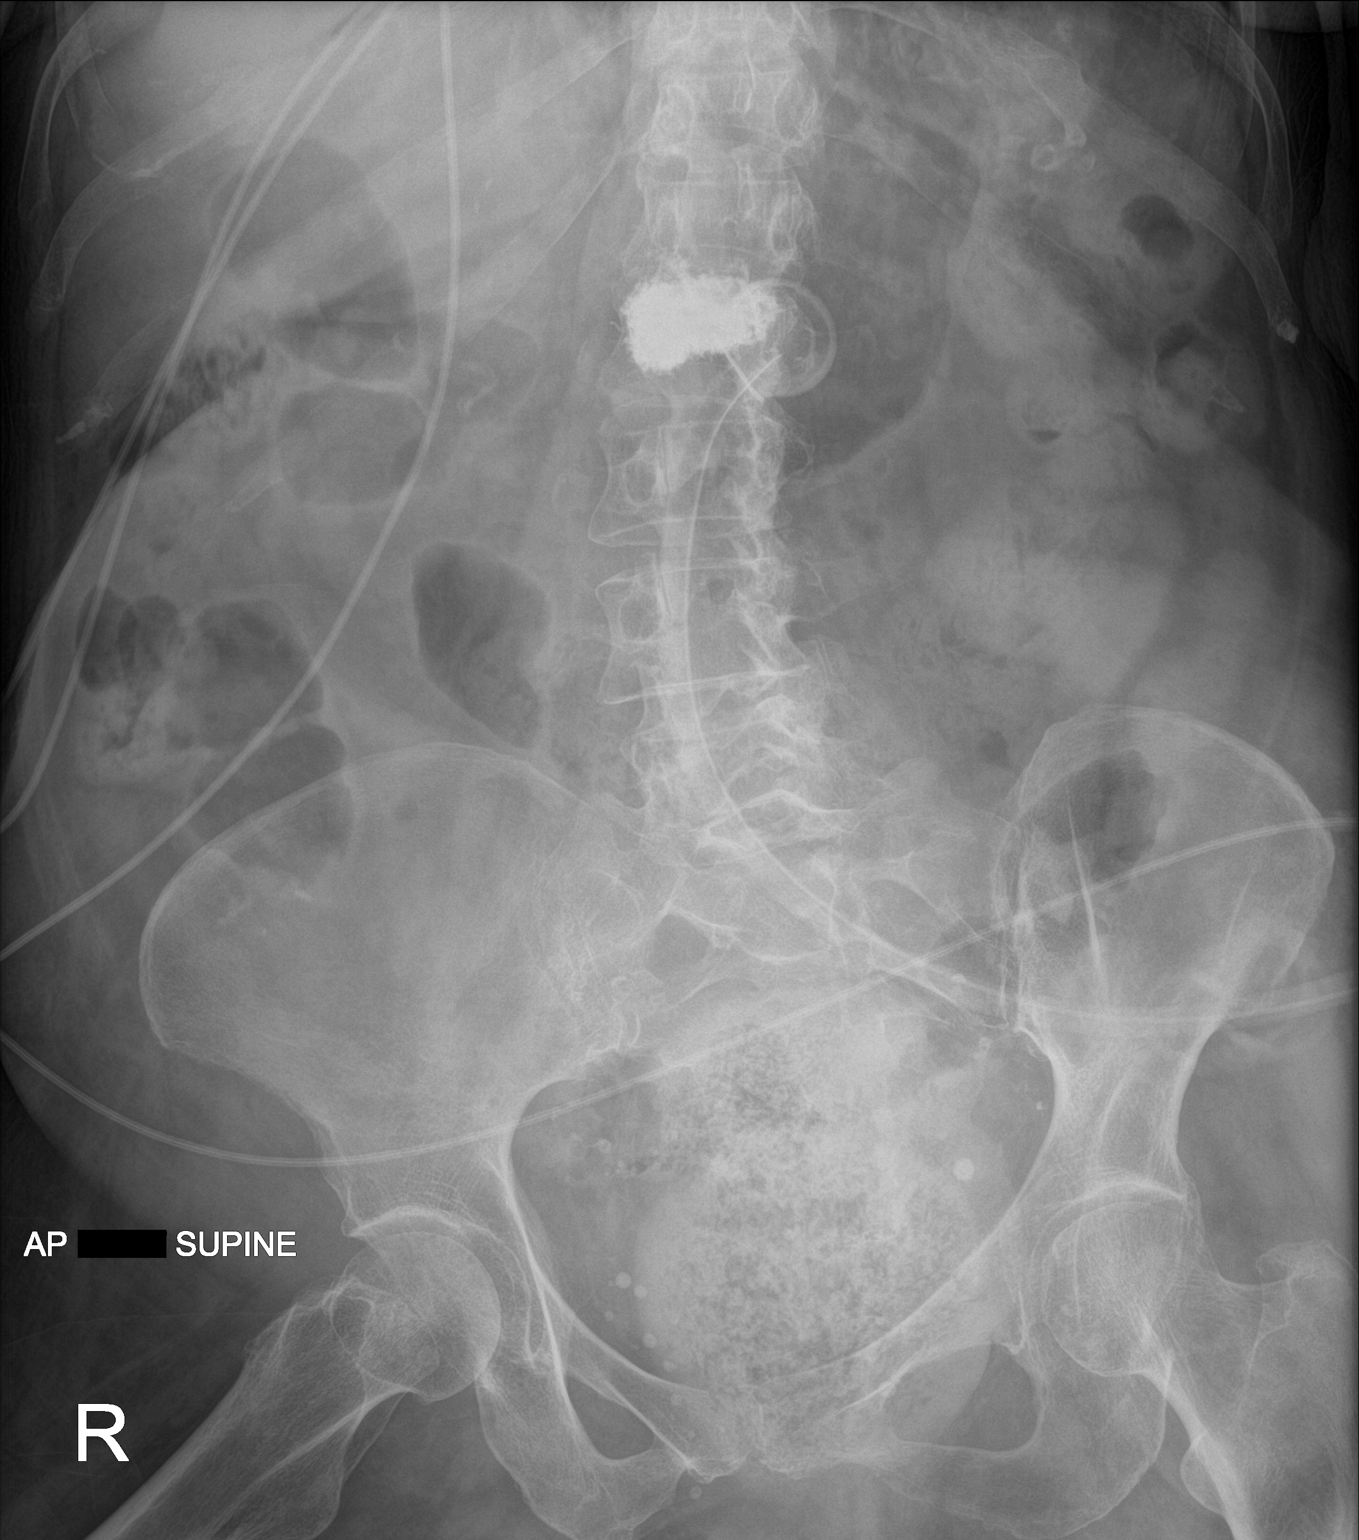

[abdomen kub (2 of 2)]
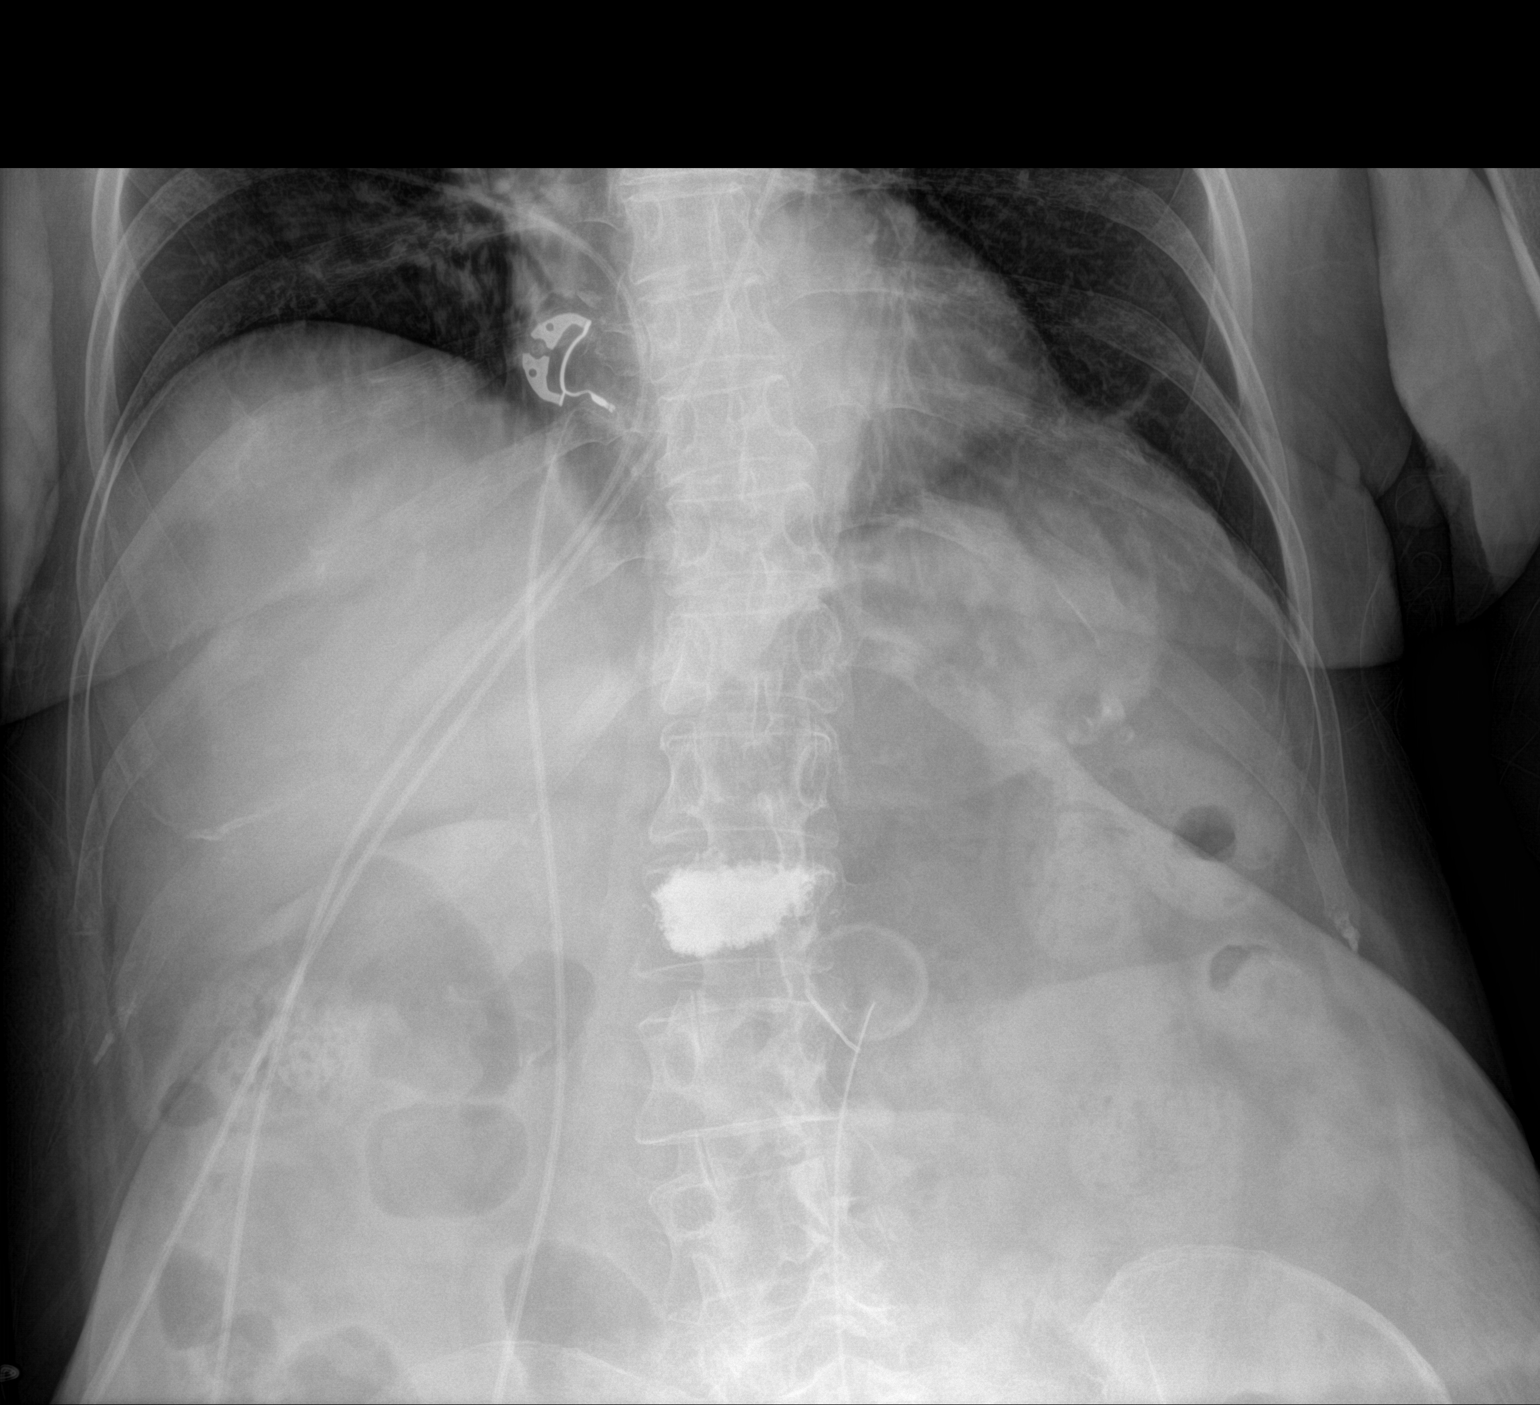

[2 of 2 positions shown; findings below may reference images not displayed]

FINDINGS: Percutaneous gastrostomy tube terminates over the body of the
stomach. Vertebroplasty changes again noted at L2. no
disproportionately dilated small bowel loops. Moderate colorectal
stool volume with dilation of the rectum to 8.9 cm diameter. No
evidence of pneumatosis or pneumoperitoneum. Clear lung bases.
IMPRESSION: Nonobstructive bowel gas pattern. Moderate colorectal stool volume,
with dilation of the rectum to 8.9 cm, cannot exclude rectal fecal
impaction.

## 2020-04-04 MED ORDER — FREE WATER
400.0000 mL | Status: DC
  Administered 2020-04-04 (×2): 400 mL

## 2020-04-04 MED ORDER — VANCOMYCIN HCL 750 MG/150ML IV SOLN
750.0000 mg | INTRAVENOUS | Status: DC
  Administered 2020-04-05 – 2020-04-06 (×2): 750 mg via INTRAVENOUS
  Filled 2020-04-04 (×2): qty 150

## 2020-04-04 MED ORDER — METOCLOPRAMIDE HCL 5 MG/ML IJ SOLN
5.0000 mg | Freq: Three times a day (TID) | INTRAMUSCULAR | Status: DC
  Administered 2020-04-04 – 2020-04-05 (×4): 5 mg via INTRAVENOUS
  Filled 2020-04-04 (×4): qty 2

## 2020-04-04 MED ORDER — SODIUM CHLORIDE 0.9 % IV SOLN: INTRAVENOUS | Status: AC

## 2020-04-04 MED ORDER — VANCOMYCIN HCL 1250 MG/250ML IV SOLN
1250.0000 mg | Freq: Once | INTRAVENOUS | Status: AC
  Administered 2020-04-04: 1250 mg via INTRAVENOUS
  Filled 2020-04-04: qty 250

## 2020-04-04 MED ORDER — PIPERACILLIN-TAZOBACTAM 3.375 G IVPB 30 MIN: 3.3750 g | Freq: Three times a day (TID) | INTRAVENOUS | Status: DC

## 2020-04-04 MED ORDER — PIPERACILLIN-TAZOBACTAM 3.375 G IVPB
3.3750 g | Freq: Three times a day (TID) | INTRAVENOUS | Status: DC
  Administered 2020-04-04 – 2020-04-12 (×23): 3.375 g via INTRAVENOUS
  Filled 2020-04-04 (×24): qty 50

## 2020-04-04 MED ORDER — SODIUM CHLORIDE 0.9 % IV SOLN: INTRAVENOUS | Status: DC

## 2020-04-04 NOTE — Progress Notes (Signed)
PROGRESS NOTE    Tasha [name]  JGG:[id number] DOB: [birth year] DOA: 03/08/2020 PCP: Patient, No Pcp Per   Brief Narrative: Patient is a 74 year old with a history of stroke, DM2, HTN, and MI who was discharged June24,2021 following a stroke work-up.  Patient presented back to the hospital on 03/08/2020 with 2-day his of intractable nausea, vomiting along and altered mental status. On presentation, patient was found to be have lactic acidosis and acute kidney injury. Patient improved with supportive care.  Due to persistent right-sided weakness that was felt to be worse than previously documented, an MRI was obtained on 03/14/20.  MRI brain revealed acute left pontine infarct and acute small infarct on the posterior body of the right lateral ventricle. Neurology was assisted with patient's management, and has signed off.  Dual antiplatelet therapy was advised for 3 weeks, and afterwards, it was recommended that Plavix should be continued.  Patient will complete aspirin therapy on 04/06/2020.  Patient was on prolonged tube feeding due to severe dysphagia.  Patient has undergone PEG tube placement by interventional radiology team.  Tube feeding was restarted, Glucerna 1.2 at rate of 55 mils per hour.  Earlier today (04/04/2020), patient was noted to be vomiting.  Patient also had residuals.  Tube feed is currently on hold.  Chest x-ray revealed possible left lower lobe atelectasis.  Abdominal x-ray revealed dilatation of the rectum to 8.9 cm, possibly secondary to fecal impaction.  Low-grade fever is noted with mild hypotension.  We will panculture patient, start patient on broad-spectrum antibiotics, proceed with soapsuds enema, start patient on IV fluids.  Patient is at risk for aspiration pneumonia.  Continue to monitor volume status.  Prognosis is guarded.  Manage patient expectantly.  Patient will be discharged back to skilled nursing facility when medically optimized.  Patient's current skilled nursing  facility has several residents with Covid.  Social work team is following patient closely.  Assessment & Plan:   Active Problems:   CVA (cerebral vascular accident) (HCC)   DM (diabetes mellitus), type 2 with peripheral vascular complications (HCC)   Essential (primary) hypertension   Lactic acidosis   Nausea and vomiting   AKI (acute kidney injury) (HCC)   Hyperkalemia   Sepsis (HCC)   Coffee ground emesis   Palliative care encounter  Acute CVA  On 7/18 with history of recent CVA June 2021 -MRI as above -Patient was on clopidogrel 75 mg dailyprior to admission, now on aspirin 81 mg daily and clopidogrel 75 mg daily.Plan is to continue DAPT x 3 weeks again then plavix alone. -Patient will complete 3 weeks of aspirin therapy on 04/06/2020.   -Patient is also on Crestor.   Acute kidney injury - Most consistent with prerenal state, w/ probable component of ATN - Bun/cr on admission at 149/9.5, improving nicely.  - Nephrology signed off, nephrology will set up follow-up appointment with Dr. Malen Gauze at [facility] 2 weeks after discharge -04/04/2020: AKI has continued to improve.  Serum creatinine today is 1.42.    Hypernatremia/hyponatremia: -Sodium was initially elevated, but not on the low side. -Urine sodium, urine and serum osmolality result noted. -DC free water. -As tube feed is on hold, will start patient on normal saline at 50 cc an hour Foley 20 hours and reassess.   -Continue to monitor electrolytes closely.    Acute combined toxic and metabolic encephalopathy: -Acute pontine/lacunarinfarct -Acute kidney injury secondary to severe dehydration -Possible aspiration pneumonia versus UTI  -Patient seems awake but not communicative. -Possible speech involvement  with the acute CVA.   -EEGunrevealing -TSH normal-B12 and folic acid not deficient  SIRS v/s Sepsis- POA Hypothermia, leukocytosis, and lactic acidosis present at admission-no clear source of infection  identified-CXR at presentation with atelectasis only-CT abdomen pelvis without acute findings-UA not consistent with UTI and culture without growth-blood cultures no growth x2-was treated with broad-spectrum empiric antibiotic for 7-day course 04/04/2020: Currently running low-grade temperature with relatively low blood pressure.  Panculture patient.  Broad-spectrum antibiotics.  Patient was admitted earlier, therefore, risk for aspiration pneumonia.  Follow urine culture to rule out UTI.  Intractable nausea vomiting and coffee-ground emesis -Hemoccult negative-probable Mallory-Weiss teardue to recurrent vomiting caused by uremia-all anticoagulants/antiplatelet meds held initially but resumed Plavix on 7/17 and aspirin on  7/19 04/04/2020: Intractable vomiting has resolved.  Patient had only one episode of vomiting earlier.  Dilatation of the rectum with possible fecal impaction reported.  For soapsuds enema.  Continue Reglan.  Dysphagia NPO as per  SLP recommendations Continues to remain feeding tube dependent so placed the PEG, however, tube feed is currently on hold..  Noninsulin-dependent type II DM 2, controlled, A1c 6.9 Home medication Metformin held Continue current insulin regimen.  Diabetic coordinator was following. 04/04/2020: Continue to optimize.    HTN lopressor to 100 mg twice a day, Norvasc 10 mg daily BP stable  NSVT, keep mag>2, k>4,   Depression: Family desires treatment, on  low-dose Remeron,   Goals of care: As per prior documentation "Had a long discussion with the son on 03/23/20.  Given her debility, poor quality of life, I offered him palliative care consultation discuss about goals of care.   But her  son wants to continue full scope of treatment.remains full code" 04/05/2019: Patient's prognosis is guarded.  Consider consulting palliative care team if the family agrees (see above documentation)   Fever:  88 2021: Low-grade fever reported today.  Work-up  for possible pneumonia and UTI.  Start broad-spectrum antibiotics.   Disposition: Patient is from skilled nursing facility.  Plan is to send her back to skilled nursing facility once patient is optimized, and a bed is available.  Currently, patient's skilled nursing facility has some many residents that tested positive for Covid 19.  Nutrition Problem: Inadequate oral intake Etiology: lethargy/confusion   DVT prophylaxis:Heparin [place] Code Status: Full Family Communication: called and discussed with son on phone today Status is: Inpatient  Remains inpatient appropriate because:Unsafe d/c plan  Consultants: Neurology  Procedures: PEG placement Antimicrobials:  Anti-infectives (From admission, onward)   Start     Dose/Rate Route Frequency Ordered Stop   03/31/20 1207  vancomycin (VANCOCIN) 1-5 GM/200ML-% IVPB       Note to Pharmacy: Erie Noe   : cabinet override      03/31/20 1207 03/31/20 1232   03/31/20 1200  vancomycin (VANCOCIN) IVPB 1000 mg/200 mL premix        1,000 mg 200 mL/hr over 60 Minutes Intravenous  Once 03/31/20 1155 03/31/20 1509   03/10/20 1200  cefTRIAXone (ROCEPHIN) 2 g in sodium chloride 0.9 % 100 mL IVPB        2 g 200 mL/hr over 30 Minutes Intravenous Every 24 hours 03/10/20 0924 03/14/20 1333   03/09/20 1000  ceFEPIme (MAXIPIME) 1 g in sodium chloride 0.9 % 100 mL IVPB  Status:  Discontinued        1 g 200 mL/hr over 30 Minutes Intravenous Every 24 hours 03/08/20 1219 03/10/20 0924   03/08/20 1219  vancomycin variable dose per unstable  renal function (pharmacist dosing)  Status:  Discontinued         Does not apply See admin instructions 03/08/20 1219 03/10/20 0906   03/08/20 1100  vancomycin (VANCOCIN) IVPB 1000 mg/200 mL premix        1,000 mg 200 mL/hr over 60 Minutes Intravenous  Once 03/08/20 1046 03/08/20 1406   03/08/20 1100  ceFEPIme (MAXIPIME) 2 g in sodium chloride 0.9 % 100 mL IVPB        2 g 200 mL/hr over 30 Minutes Intravenous  Once  03/08/20 1046 03/08/20 1211      Subjective:  Patient seen and examined the bedside today.  Hemodynamically stable.  Same as yesterday, weak, lethargic lying on the bed.  There was concern of 2 episodes of vomiting since yesterday.  PEG will be started on slow rate  Objective: Vitals:   04/03/20 2322 04/04/20 0311 04/04/20 0608 04/04/20 0807  BP: 128/64 127/65 (!) 143/72 (!) 146/54  Pulse: 69 66 79 85  Resp: 18 18  20   Temp: 99.5 F (37.5 C) 99.1 F (37.3 C)  98.7 F (37.1 C)  TempSrc: Axillary Oral  Axillary  SpO2: 99% 99%  93%  Weight:  59.4 kg    Height:        Intake/Output Summary (Last 24 hours) at 04/04/2020 1005 Last data filed at 04/04/2020 0800 Gross per 24 hour  Intake [id number] ml  Output 551 ml  Net 14527 ml   Filed Weights   04/02/20 0346 04/03/20 0213 04/04/20 0311  Weight: 55.8 kg 59.4 kg 59.4 kg    Examination:   General exam: No significant participation from the patient. Respiratory system: Clear to auscultation. cardiovascular system: S1 & S2 heard Gastrointestinal system: Abdomen is firm, nontender.  Organs are difficult to assess.   Central nervous system: Right-sided weakness/hemiplegia.  Patient has not communicated. Extremities: No edema, no clubbing, no cyanosis  Data Reviewed: I have personally reviewed following labs and imaging studies  CBC: Recent Labs  Lab 03/29/20 0120 03/31/20 0434 04/02/20 0432 04/04/20 0315  WBC 12.6* 12.9* 14.5* 18.2*  NEUTROABS 8.8* 9.3* 11.5* 15.3*  HGB 9.0* 9.3* 8.5* 10.1*  HCT 28.1* 28.5* 27.4* 30.6*  MCV 89.8 89.3 92.9 89.0  PLT 316 404* 386 637*   Basic Metabolic Panel: Recent Labs  Lab 03/29/20 0120 03/31/20 0434 04/02/20 0432 04/04/20 0315  NA 138 135 138 128*  K 4.1 4.1 4.1 4.7  CL 107 103 103 93*  CO2 23 21* 19* 20*  GLUCOSE 143* 100* 195* 219*  BUN 61* 49* 63* 63*  CREATININE 1.52* 1.21* 1.53* 1.42*  CALCIUM 9.1 9.2 9.1 9.7  MG  --   --   --  2.0  PHOS  --   --   --  3.7    GFR: Estimated Creatinine Clearance: 30.5 mL/min (A) (by C-G formula based on SCr of 1.42 mg/dL (H)). Liver Function Tests: Recent Labs  Lab 04/04/20 0315  ALBUMIN 2.5*   No results for input(s): LIPASE, AMYLASE in the last 168 hours. No results for input(s): AMMONIA in the last 168 hours. Coagulation Profile: Recent Labs  Lab 03/31/20 0434  INR 1.1   Cardiac Enzymes: No results for input(s): CKTOTAL, CKMB, CKMBINDEX, TROPONINI in the last 168 hours. BNP (last 3 results) No results for input(s): PROBNP in the last 8760 hours. HbA1C: No results for input(s): HGBA1C in the last 72 hours. CBG: Recent Labs  Lab 04/03/20 1621 04/03/20 2022 04/03/20 2321 04/04/20 0310 04/04/20 [id number]  GLUCAP 125* 145* 208* 228* 213*   Lipid Profile: No results for input(s): CHOL, HDL, LDLCALC, TRIG, CHOLHDL, LDLDIRECT in the last 72 hours. Thyroid Function Tests: No results for input(s): TSH, T4TOTAL, FREET4, T3FREE, THYROIDAB in the last 72 hours. Anemia Panel: No results for input(s): VITAMINB12, FOLATE, FERRITIN, TIBC, IRON, RETICCTPCT in the last 72 hours. Sepsis Labs: No results for input(s): PROCALCITON, LATICACIDVEN in the last 168 hours.  Recent Results (from the past 240 hour(s))  SARS Coronavirus 2 by RT PCR (hospital order, performed in [facility] hospital lab) Nasopharyngeal Nasopharyngeal Swab     Status: None   Collection Time: 04/02/20  1:21 PM   Specimen: Nasopharyngeal Swab  Result Value Ref Range Status   SARS Coronavirus 2 NEGATIVE NEGATIVE Final    Comment: (NOTE) SARS-CoV-2 target nucleic acids are NOT DETECTED.  The SARS-CoV-2 RNA is generally detectable in upper and lower respiratory specimens during the acute phase of infection. The lowest concentration of SARS-CoV-2 viral copies this assay can detect is 250 copies / mL. A negative result does not preclude SARS-CoV-2 infection and should not be used as the sole basis for treatment or other patient management  decisions.  A negative result may occur with improper specimen collection / handling, submission of specimen other than nasopharyngeal swab, presence of viral mutation(s) within the areas targeted by this assay, and inadequate number of viral copies (<250 copies / mL). A negative result must be combined with clinical observations, patient history, and epidemiological information.  Fact Sheet for Patients:   BoilerBrush.com.cy  Fact Sheet for Healthcare Providers: https://pope.com/  This test is not yet approved or  cleared by the [place] FDA and has been authorized for detection and/or diagnosis of SARS-CoV-2 by FDA under an Emergency Use Authorization (EUA).  This EUA will remain in effect (meaning this test can be used) for the duration of the COVID-19 declaration under Section 564(b)(1) of the Act, 21 U.S.C. section 360bbb-3(b)(1), unless the authorization is terminated or revoked sooner.  Performed at [facility], [address] [place], [place] [address]          Radiology Studies: No results found.      Scheduled Meds: . amLODipine  10 mg Per Tube Daily  . aspirin  81 mg Per Tube Daily  . chlorhexidine  15 mL Mouth Rinse BID  . Chlorhexidine Gluconate Cloth  6 each Topical Daily  . clopidogrel  75 mg Per Tube Daily  . free water  400 mL Per Tube Q4H  . heparin injection (subcutaneous)  5,000 Units Subcutaneous Q8H  . hydrALAZINE  50 mg Per Tube Q8H  . insulin aspart  0-15 Units Subcutaneous TID WC  . insulin aspart  5 Units Subcutaneous Q4H  . insulin detemir  10 Units Subcutaneous BID  . mouth rinse  15 mL Mouth Rinse BID  . metoprolol tartrate  100 mg Per Tube BID  . mirtazapine  7.5 mg Per Tube QHS  . neomycin-bacitracin-polymyxin  1 application Topical Daily  . pantoprazole sodium  40 mg Per Tube Daily  . rosuvastatin  5 mg Per Tube Daily   Continuous Infusions: . feeding supplement  (GLUCERNA 1.2 CAL) 55 mL/hr at 04/04/20 0800     LOS: 27 days    Time spent: 35 mins.   Barnetta Chapel, MD [facility] P8/03/2020, 10:05 AM

## 2020-04-04 NOTE — Progress Notes (Addendum)
Spoke w/ Mendy (RR) about pt's BP, MAP, and HR and pt's scheduled hydralazine and metop. Mendy advised to hold meds d/t vs.

## 2020-04-04 NOTE — Plan of Care (Signed)

## 2020-04-04 NOTE — Progress Notes (Signed)
Soap suds enema given x 2.  Large amounts of stool expelled.

## 2020-04-04 NOTE — Plan of Care (Signed)

## 2020-04-04 NOTE — Progress Notes (Signed)
V.O. per Dr. Loney Loh, "take manual BP to confirm BP reading".

## 2020-04-04 NOTE — Progress Notes (Signed)
BP 102/42 and HR 79. Pt due 100 metop and 50 mg hydralazine. Rathore informed and asked about safety of admin these meds d/t BP.

## 2020-04-04 NOTE — Progress Notes (Signed)
We received a call from the 3W charge nurse stating that this patient has triggered a yellow MEWs score and she needed [place] to come check on the patient.  The patient was triggering the MEWs score because of a low-grade fever and low blood pressure.  When we walked into the room the staff was getting the patient cleaned up.  She was arousable to stimulation.  We took vital signs and a rectal temperature.  The nurse had stated that she had dark urine that was cloudy.  The doctor was notified and he put in orders for blood cultures, urine culture, broad-spectrum antibiotics, and additional labs.  After the new set of vitals were taken, the patient had a green MEWs score.  Before we left we instructed the staff to call us if there are any further changes to the patient.    Temp 100.5 F rectal BP 103/43 Pulse 71 Resp 16 O2 92% nasal cannula  Cherly Hensen, RN

## 2020-04-04 NOTE — Progress Notes (Signed)
Rathore paged three time about pt's BP and scheduled metop and hydalazine. MD replied back to inform me that she will get back with me about this following priority calls. Will wait and conti to monitor my pts.

## 2020-04-04 NOTE — Progress Notes (Signed)
Pharmacy Antibiotic Note  Tasha [name] is a 74 y.o. female admitted on 03/08/2020 with pneumonia.  Pharmacy has been consulted for vancomycin dosing.  Plan: Vancomycin 1250 mg x 1 now, then 750 mg q 24 hrs. F/u renal function, cultures and clinical course.  Height: 5\' 4"  (162.6 cm) Weight: 59.4 kg (131 lb) IBW/kg (Calculated) : 54.7  Temp (24hrs), Avg:99.3 F (37.4 C), Min:98.7 F (37.1 C), Max:100.5 F (38.1 C)  Recent Labs  Lab 03/29/20 0120 03/31/20 0434 04/02/20 0432 04/04/20 0315  WBC 12.6* 12.9* 14.5* 18.2*  CREATININE 1.52* 1.21* 1.53* 1.42*    Estimated Creatinine Clearance: 30.5 mL/min (A) (by C-G formula based on SCr of 1.42 mg/dL (H)).    Allergies  Allergen Reactions  . Allopurinol Shortness Of Breath  . Tetracycline Anaphylaxis  . Amoxicillin-Pot Clavulanate Diarrhea  . Atorvastatin Other (See Comments)    Unknown reaction - reported by [facility], [facility] clinic 05/10/2012  . Azithromycin Other (See Comments)    Caused blisters inside and out  . Codeine Other (See Comments)    Altered mental status  . Lisinopril Other (See Comments)    Unknown reaction - reported by [facility], [facility] clinic 12/23/2008 02/20/2020 she believes "reaction" was NP cough. No PMH of angioedema  . Sulfa Antibiotics Diarrhea      Thank you for allowing pharmacy to be a part of this patient's care.  02/22/2020, Reece Leader, BCCP Clinical Pharmacist  04/04/2020 5:29 PM   [facility] pharmacy phone numbers are listed on [email or web address]

## 2020-04-05 LAB — BASIC METABOLIC PANEL
Anion gap: 15 (ref 5–15)
BUN: 77 mg/dL — ABNORMAL HIGH (ref 8–23)
CO2: 19 mmol/L — ABNORMAL LOW (ref 22–32)
Calcium: 9.2 mg/dL (ref 8.9–10.3)
Chloride: 94 mmol/L — ABNORMAL LOW (ref 98–111)
Creatinine, Ser: 1.87 mg/dL — ABNORMAL HIGH (ref 0.44–1.00)
GFR calc Af Amer: 30 mL/min — ABNORMAL LOW (ref >60)
GFR calc non Af Amer: 26 mL/min — ABNORMAL LOW (ref >60)
Glucose, Bld: 137 mg/dL — ABNORMAL HIGH (ref 70–99)
Potassium: 4.2 mmol/L (ref 3.5–5.1)
Sodium: 128 mmol/L — ABNORMAL LOW (ref 135–145)

## 2020-04-05 LAB — GLUCOSE, CAPILLARY
Glucose-Capillary: 198 mg/dL — ABNORMAL HIGH (ref 70–99)
Glucose-Capillary: 126 mg/dL — ABNORMAL HIGH (ref 70–99)
Glucose-Capillary: 140 mg/dL — ABNORMAL HIGH (ref 70–99)
Glucose-Capillary: 151 mg/dL — ABNORMAL HIGH (ref 70–99)
Glucose-Capillary: 94 mg/dL (ref 70–99)
Glucose-Capillary: 96 mg/dL (ref 70–99)
Glucose-Capillary: 100 mg/dL — ABNORMAL HIGH (ref 70–99)
Glucose-Capillary: 94 mg/dL (ref 70–99)

## 2020-04-05 LAB — CBC WITH DIFFERENTIAL/PLATELET
Abs Immature Granulocytes: 0.3 10*3/uL — ABNORMAL HIGH (ref 0.00–0.07)
Basophils Absolute: 0.1 10*3/uL (ref 0.0–0.1)
Basophils Relative: 1 %
Eosinophils Absolute: 0.2 10*3/uL (ref 0.0–0.5)
Eosinophils Relative: 1 %
HCT: 23 % — ABNORMAL LOW (ref 36.0–46.0)
Hemoglobin: 7.7 g/dL — ABNORMAL LOW (ref 12.0–15.0)
Immature Granulocytes: 2 %
Lymphocytes Relative: 9 %
Lymphs Abs: 1.6 10*3/uL (ref 0.7–4.0)
MCH: 29.7 pg (ref 26.0–34.0)
MCHC: 33.5 g/dL (ref 30.0–36.0)
MCV: 88.8 fL (ref 80.0–100.0)
Monocytes Absolute: 0.4 10*3/uL (ref 0.1–1.0)
Monocytes Relative: 2 %
Neutro Abs: 15.6 10*3/uL — ABNORMAL HIGH (ref 1.7–7.7)
Neutrophils Relative %: 85 %
Platelets: 470 10*3/uL — ABNORMAL HIGH (ref 150–400)
RBC: 2.59 MIL/uL — ABNORMAL LOW (ref 3.87–5.11)
RDW: 15.3 % (ref 11.5–15.5)
WBC: 18.1 10*3/uL — ABNORMAL HIGH (ref 4.0–10.5)
nRBC: 0.1 % (ref 0.0–0.2)

## 2020-04-05 LAB — URINE CULTURE: Culture: <10000 — AB

## 2020-04-05 LAB — PHOSPHORUS: Phosphorus: 4.5 mg/dL (ref 2.5–4.6)

## 2020-04-05 LAB — MAGNESIUM: Magnesium: 2.1 mg/dL (ref 1.7–2.4)

## 2020-04-05 MED ORDER — POLYETHYLENE GLYCOL 3350 17 G PO PACK: 17.0000 g | PACK | Freq: Two times a day (BID) | ORAL | Status: DC

## 2020-04-05 MED ORDER — POLYETHYLENE GLYCOL 3350 17 G PO PACK
17.0000 g | PACK | Freq: Two times a day (BID) | ORAL | Status: DC
  Administered 2020-04-05 – 2020-04-11 (×10): 17 g
  Filled 2020-04-05 (×10): qty 1

## 2020-04-05 NOTE — Progress Notes (Signed)
Foley Cath insterted due to urine retention, protocol followed prior to insertion, B/S, order obtained, MD Dahal, urine return noted clear straw color

## 2020-04-05 NOTE — Progress Notes (Addendum)
Palliative Medicine RN Note: Our team continues to follow at a distance and chart check every few days. Noted recent events & updated PMT.  Margret Chance Valeta Paz, RN, BSN, [facility] Palliative Medicine Team 04/05/2020 1:54 PM Office [phone]

## 2020-04-05 NOTE — Progress Notes (Addendum)
PROGRESS NOTE  Tasha [name]  DOB: January 31, 1946  PCP: Patient, No Pcp Per BMW:[id number]  DOA: 03/08/2020  LOS: 28 days   Chief Complaint  Patient presents with  . Emesis   Brief narrative: Patient is a 74 year old with a history of stroke, DM2, HTN, and MI who was discharged June24,2021 following a stroke work-up.   Patient presented back to the hospital on 03/08/2020 with 2-day his of intractable nausea, vomiting along and altered mental status.  On presentation, patient was found to be have lactic acidosis and acute kidney injury. Patient improved with supportive care. Due to persistent right-sided weakness that was felt to be worse than previously documented, an MRI was obtained on 03/14/20.   MRI brain revealed acute left pontine infarct and acute small infarct on the posterior body of the right lateral ventricle.  Neurology consultation obtained.  Work-up done. Patient was noted to have severe dysphagia, she was started on tube feeding.  She is still on tube feeding for several days in the hospital.  Finally she underwent PEG tube placement by IR on 8/4.  She was started on PEG tube feeding however she has had episodes of vomiting after that.  PEG tube feeding adjusted.  She also started on work-up for possibility of aspiration pneumonia. Palliative consultation appreciated.  However family is opting for full aggressive care.  Subjective: Patient was seen and examined this afternoon. Elderly Caucasian female.  Lying down in bed.  Tries to open eyes on touch stimulus. Seems lethargic. T-max of 100.5 last 24 hours.  Blood pressure was low in 80s yesterday, now improved to 120s.  On 2 L oxygen by nasal cannula. 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  -Per MRI on 7/18 with history of  recent CVA June 2021 -Patient was on clopidogrel 75 mg dailyprior to admission. -With new stroke aspirin 81 mg daily was added to continue for 3 weeks till 04/06/20. -After that, patient will continue Plavix alone.  Continue Crestor.  Severe dysphagia -Currently has PEG tube with Glucerna 1.2 at 55 mill per hour.  Aspiration pneumonia -Patient started vomiting after PEG tube placement.  Also low-grade fever, elevated WBC count.  Started on IV Zosyn and IV vancomycin.   -Pending culture report.    Acute kidney injury -BUN/creatinine was significantly elevated to 149/1.5 on admission.  -With IV hydration, gradually improved, creatinine 1.87 today.  -Continue to monitor.  -Nephrology signed off.   Hypernatremia/hyponatremia: -Sodium was initially elevated, but now sodium level is down, 128 today. -IV fluids stopped.  Activity started.  Monitor sodium level.  Acute combined toxic and metabolic encephalopathy: -Multifactorial : acute pontine/lacunarinfarct, dehydration, dyslipidemia, hospital induced delirium, -EEGunrevealing -TSH normal-B12 and folic acid not deficient -Continue to monitor mental status.  Acute urinary retention -Routine and out catheterization twice since last night.  Okay to insert a Foley catheter.  Severe constipation -Likely contributing to vomiting and residuals in PEG tube feeding. -8/8, abdominal x-ray showed rectal fecal impaction. -Patient had a bowel movement after enema.  Continue MiraLAX daily.  Noninsulin-dependent type II DM 2, controlled, A1c 6.9 -Metformin on hold.  Continue Levemir 10 units, every 4 hours sliding scale and Accu-Cheks.  -Metformin hold.    HTN lopressor to 100 mg twice a day, Norvasc 10 mg daily BP stable  NSVT, keep  mag>2, k>4,   Depression: Family desires treatment, on  low-dose Remeron,   Goals of care: As per prior documentation "Had a long discussion with the son on 03/23/20.  Given her debility, poor  quality of life, I offered him palliative care consultation discuss about goals of care.   But her  son wants to continue full scope of treatment.remains full code" 04/05/2019: Patient's prognosis is guarded.    Palliative care has been peripherally following.  Nutrition Problem: Inadequate oral intake Etiology: lethargy/confusion Mobility: Limited Code Status:   Code Status: Full Code  Nutritional status: Body mass index is 22.55 kg/m. Nutrition Problem: Inadequate oral intake Etiology: lethargy/confusion Signs/Symptoms: other (comment) (per RN report) Diet Order            Diet NPO time specified  Diet effective midnight                 DVT prophylaxis: heparin injection 5,000 Units Start: 03/31/20 2200   Antimicrobials:  IV Zosyn, vancomycin Fluid: No IV fluid  Consultants: Palliative care Family Communication:  None at bedside  Status is: Inpatient  Remains inpatient appropriate because:Altered mental status and IV treatments appropriate due to intensity of illness or inability to take PO   Dispo:  Patient From:   SNF  Planned Disposition:   Back to SNF  Expected discharge date:   Probably next 2 to 3 days  Medically stable for discharge:   No    Infusions:  . feeding supplement (GLUCERNA 1.2 CAL) 1,000 mL (04/05/20 0337)  . piperacillin-tazobactam (ZOSYN)  IV 3.375 g (04/05/20 1246)  . vancomycin      Scheduled Meds: . chlorhexidine  15 mL Mouth Rinse BID  . Chlorhexidine Gluconate Cloth  6 each Topical Daily  . clopidogrel  75 mg Per Tube Daily  . heparin injection (subcutaneous)  5,000 Units Subcutaneous Q8H  . insulin aspart  0-15 Units Subcutaneous TID WC  . insulin aspart  5 Units Subcutaneous Q4H  . insulin detemir  10 Units Subcutaneous BID  . mouth rinse  15 mL Mouth Rinse BID  . metoCLOPramide (REGLAN) injection  5 mg Intravenous Q8H  . mirtazapine  7.5 mg Per Tube QHS  . neomycin-bacitracin-polymyxin  1 application Topical Daily  .  pantoprazole sodium  40 mg Per Tube Daily  . polyethylene glycol  17 g Per Tube BID  . rosuvastatin  5 mg Per Tube Daily    Antimicrobials: Anti-infectives (From admission, onward)   Start     Dose/Rate Route Frequency Ordered Stop   04/05/20 1800  vancomycin (VANCOREADY) IVPB 750 mg/150 mL     Discontinue     750 mg 150 mL/hr over 60 Minutes Intravenous Every 24 hours 04/04/20 1727     04/04/20 1730  vancomycin (VANCOREADY) IVPB 1250 mg/250 mL        1,250 mg 166.7 mL/hr over 90 Minutes Intravenous  Once 04/04/20 1727 04/04/20 1948   04/04/20 1730  piperacillin-tazobactam (ZOSYN) IVPB 3.375 g     Discontinue     3.375 g 12.5 mL/hr over 240 Minutes Intravenous Every 8 hours 04/04/20 1727     04/04/20 1715  piperacillin-tazobactam (ZOSYN) IVPB 3.375 g  Status:  Discontinued       Note to Pharmacy: Adjust as per renal function   3.375 g 100 mL/hr over 30 Minutes Intravenous Every 8 hours 04/04/20 1706 04/04/20 1726   03/31/20 1207  vancomycin (VANCOCIN) 1-5 GM/200ML-% IVPB       Note to Pharmacy:  [name], [name]   : cabinet override      03/31/20 1207 03/31/20 1232   03/31/20 1200  vancomycin (VANCOCIN) IVPB 1000 mg/200 mL premix        1,000 mg 200 mL/hr over 60 Minutes Intravenous  Once 03/31/20 1155 03/31/20 1509   03/10/20 1200  cefTRIAXone (ROCEPHIN) 2 g in sodium chloride 0.9 % 100 mL IVPB        2 g 200 mL/hr over 30 Minutes Intravenous Every 24 hours 03/10/20 0924 03/14/20 1333   03/09/20 1000  ceFEPIme (MAXIPIME) 1 g in sodium chloride 0.9 % 100 mL IVPB  Status:  Discontinued        1 g 200 mL/hr over 30 Minutes Intravenous Every 24 hours 03/08/20 1219 03/10/20 0924   03/08/20 1219  vancomycin variable dose per unstable renal function (pharmacist dosing)  Status:  Discontinued         Does not apply See admin instructions 03/08/20 1219 03/10/20 0906   03/08/20 1100  vancomycin (VANCOCIN) IVPB 1000 mg/200 mL premix        1,000 mg 200 mL/hr over 60 Minutes Intravenous  Once  03/08/20 1046 03/08/20 1406   03/08/20 1100  ceFEPIme (MAXIPIME) 2 g in sodium chloride 0.9 % 100 mL IVPB        2 g 200 mL/hr over 30 Minutes Intravenous  Once 03/08/20 1046 03/08/20 1211      PRN meds: acetaminophen **OR** [DISCONTINUED] acetaminophen, lip balm, polyethylene glycol, Resource ThickenUp Clear   Objective: Vitals:   04/05/20 1208 04/05/20 1604  BP:  (!) 107/54  Pulse:  (!) 109  Resp:  18  Temp: 99 F (37.2 C) 99 F (37.2 C)  SpO2:  93%    Intake/Output Summary (Last 24 hours) at 04/05/2020 1738 Last data filed at 04/05/2020 1200 Gross per 24 hour  Intake 2337.4 ml  Output 2801 ml  Net -463.6 ml   Filed Weights   04/03/20 0213 04/04/20 0311 04/05/20 0325  Weight: 59.4 kg 59.4 kg 59.6 kg   Weight change: 0.179 kg Body mass index is 22.55 kg/m.   Physical Exam: General exam: Lethargic Skin: No rashes, lesions or ulcers. HEENT: Atraumatic, normocephalic, supple neck, no obvious bleeding Lungs: Clear to auscultation bilaterally CVS: Regular rate and rhythm, no murmur GI/Abd soft, nontender, nondistended, active bleeding CNS: Lethargic Psychiatry: Depressed look Extremities: No pedal edema, no calf tenderness  Data Review: I have personally reviewed the laboratory data and studies available.  Recent Labs  Lab 03/31/20 0434 04/02/20 0432 04/04/20 0315 04/05/20 0352  WBC 12.9* 14.5* 18.2* 18.1*  NEUTROABS 9.3* 11.5* 15.3* 15.6*  HGB 9.3* 8.5* 10.1* 7.7*  HCT 28.5* 27.4* 30.6* 23.0*  MCV 89.3 92.9 89.0 88.8  PLT 404* 386 637* 470*   Recent Labs  Lab 03/31/20 0434 04/02/20 0432 04/04/20 0315 04/05/20 0352 04/05/20 0942  NA 135 138 128* 128*  --   K 4.1 4.1 4.7 4.2  --   CL 103 103 93* 94*  --   CO2 21* 19* 20* 19*  --   GLUCOSE 100* 195* 219* 137*  --   BUN 49* 63* 63* 77*  --   CREATININE 1.21* 1.53* 1.42* 1.87*  --   CALCIUM 9.2 9.1 9.7 9.2  --   MG  --   --  2.0 2.1  --   PHOS  --   --  3.7  --  4.5   Lab Results  Component Value  Date   HGBA1C 6.9 (  H) 02/15/2020       Component Value Date/Time   CHOL 223 (H) 02/16/2020 1400   TRIG 157 (H) 02/16/2020 1400   HDL 49 02/16/2020 1400   CHOLHDL 4.6 02/16/2020 1400   VLDL 31 02/16/2020 1400   LDLCALC 143 (H) 02/16/2020 1400    Signed, Lorin Glass, MD [facility] Pager: [phone] (Secure Chat preferred). 04/05/2020

## 2020-04-05 NOTE — Plan of Care (Signed)

## 2020-04-05 NOTE — Progress Notes (Signed)
  Speech Language Pathology Treatment: Dysphagia  Patient Details Name: Tasha [name] MRN: [id number] DOB: 23-Jun-1946 Today's Date: 04/05/2020 Time: 0945-1000 SLP Time Calculation (min) (ACUTE ONLY): 15 min  Assessment / Plan / Recommendation Clinical Impression  Pt seen for continued assessment of PO readiness. Pt was sleeping upon arrival of SLP, but awakened easily with gentle verbal and tactile stim. Dried secretions noted around lips. Oral care was completed with suction with successful removal of dried secretions externally, and slight secretions from inside the oral cavity. Pt was nonverbal throughout this session. No PO trials were provided, as pt was deemed to be at significantly high risk of aspiration.   Plan of care updated. Frequency of treatment will be decreased to min 1x/week given poor progress toward goals thus far. SLP available if needs arise in the interim - please do not hesitate to contact the department or secure chat speech therapists directly.   HPI HPI: Tasha [name] is a 74 y.o. female with history of stroke in June, HTN, MI and DM presenting 7/12 with nausea and vomiting, altered mental status, admitted w/ uremia and possible infection. In [name], R sided weakness not improving, MRI showed acute left pontine infarction. Acute small infarct along the posterior body of the right lateral ventricle. Seen for swallowing during admission 02/17/20 >nectar thick then upgraded to thin/full liquids. Pt now has PEG tube for nutrition.      SLP Plan  Continue with current plan of care       Recommendations  Diet recommendations: NPO Medication Administration: Via alternative means                Oral Care Recommendations: Oral care QID Follow up Recommendations: Skilled Nursing facility;24 hour supervision/assistance SLP Visit Diagnosis: Dysphagia, unspecified (R13.10) Plan: Continue with current plan of care       GO               Tasha [name] Tasha [name] Tasha, [name],  CCC-SLP Speech Language Pathologist Office: [phone]  Tasha [name] 04/05/2020, 10:06 AM

## 2020-04-05 NOTE — Progress Notes (Signed)
Hgb trend down, now 7.7 from 10.1 w/o any noted blood loss this shift. Dr. Loney Loh informed.

## 2020-04-06 LAB — PHOSPHORUS: Phosphorus: 4.1 mg/dL (ref 2.5–4.6)

## 2020-04-06 LAB — CBC WITH DIFFERENTIAL/PLATELET
Abs Immature Granulocytes: 0.53 10*3/uL — ABNORMAL HIGH (ref 0.00–0.07)
Basophils Absolute: 0.1 10*3/uL (ref 0.0–0.1)
Basophils Relative: 0 %
Eosinophils Absolute: 0.5 10*3/uL (ref 0.0–0.5)
Eosinophils Relative: 3 %
HCT: 22.7 % — ABNORMAL LOW (ref 36.0–46.0)
Hemoglobin: 7.2 g/dL — ABNORMAL LOW (ref 12.0–15.0)
Immature Granulocytes: 3 %
Lymphocytes Relative: 10 %
Lymphs Abs: 2 10*3/uL (ref 0.7–4.0)
MCH: 28.5 pg (ref 26.0–34.0)
MCHC: 31.7 g/dL (ref 30.0–36.0)
MCV: 89.7 fL (ref 80.0–100.0)
Monocytes Absolute: 0.8 10*3/uL (ref 0.1–1.0)
Monocytes Relative: 4 %
Neutro Abs: 16.4 10*3/uL — ABNORMAL HIGH (ref 1.7–7.7)
Neutrophils Relative %: 80 %
Platelets: 392 10*3/uL (ref 150–400)
RBC: 2.53 MIL/uL — ABNORMAL LOW (ref 3.87–5.11)
RDW: 15.7 % — ABNORMAL HIGH (ref 11.5–15.5)
WBC: 20.3 10*3/uL — ABNORMAL HIGH (ref 4.0–10.5)
nRBC: 0 % (ref 0.0–0.2)

## 2020-04-06 LAB — GLUCOSE, CAPILLARY
Glucose-Capillary: 113 mg/dL — ABNORMAL HIGH (ref 70–99)
Glucose-Capillary: 124 mg/dL — ABNORMAL HIGH (ref 70–99)
Glucose-Capillary: 128 mg/dL — ABNORMAL HIGH (ref 70–99)
Glucose-Capillary: 133 mg/dL — ABNORMAL HIGH (ref 70–99)
Glucose-Capillary: 147 mg/dL — ABNORMAL HIGH (ref 70–99)
Glucose-Capillary: 82 mg/dL (ref 70–99)
Glucose-Capillary: 96 mg/dL (ref 70–99)

## 2020-04-06 LAB — BASIC METABOLIC PANEL
Anion gap: 14 (ref 5–15)
BUN: 65 mg/dL — ABNORMAL HIGH (ref 8–23)
CO2: 21 mmol/L — ABNORMAL LOW (ref 22–32)
Calcium: 9.1 mg/dL (ref 8.9–10.3)
Chloride: 102 mmol/L (ref 98–111)
Creatinine, Ser: 1.57 mg/dL — ABNORMAL HIGH (ref 0.44–1.00)
GFR calc Af Amer: 38 mL/min — ABNORMAL LOW (ref >60)
GFR calc non Af Amer: 32 mL/min — ABNORMAL LOW (ref >60)
Glucose, Bld: 115 mg/dL — ABNORMAL HIGH (ref 70–99)
Potassium: 3.6 mmol/L (ref 3.5–5.1)
Sodium: 137 mmol/L (ref 135–145)

## 2020-04-06 LAB — MAGNESIUM: Magnesium: 2.1 mg/dL (ref 1.7–2.4)

## 2020-04-06 MED ORDER — INSULIN ASPART 100 UNIT/ML ~~LOC~~ SOLN
0.0000 [IU] | SUBCUTANEOUS | Status: DC
  Administered 2020-04-07: 1 [IU] via SUBCUTANEOUS
  Administered 2020-04-07 (×3): 2 [IU] via SUBCUTANEOUS
  Administered 2020-04-07: 1 [IU] via SUBCUTANEOUS
  Administered 2020-04-08 (×4): 2 [IU] via SUBCUTANEOUS
  Administered 2020-04-08 (×2): 1 [IU] via SUBCUTANEOUS
  Administered 2020-04-09: 2 [IU] via SUBCUTANEOUS
  Administered 2020-04-09: 3 [IU] via SUBCUTANEOUS
  Administered 2020-04-09: 1 [IU] via SUBCUTANEOUS
  Administered 2020-04-09: 3 [IU] via SUBCUTANEOUS
  Administered 2020-04-09: 2 [IU] via SUBCUTANEOUS
  Administered 2020-04-10: 1 [IU] via SUBCUTANEOUS
  Administered 2020-04-10: 2 [IU] via SUBCUTANEOUS
  Administered 2020-04-10: 1 [IU] via SUBCUTANEOUS
  Administered 2020-04-10 – 2020-04-13 (×16): 2 [IU] via SUBCUTANEOUS
  Administered 2020-04-13: 3 [IU] via SUBCUTANEOUS
  Administered 2020-04-13 (×2): 2 [IU] via SUBCUTANEOUS
  Administered 2020-04-13 (×2): 3 [IU] via SUBCUTANEOUS
  Administered 2020-04-14 (×5): 2 [IU] via SUBCUTANEOUS
  Administered 2020-04-15: 3 [IU] via SUBCUTANEOUS
  Administered 2020-04-15 – 2020-04-16 (×8): 2 [IU] via SUBCUTANEOUS
  Administered 2020-04-16: 3 [IU] via SUBCUTANEOUS
  Administered 2020-04-16 (×2): 2 [IU] via SUBCUTANEOUS
  Administered 2020-04-17 (×2): 3 [IU] via SUBCUTANEOUS
  Administered 2020-04-17 – 2020-04-18 (×7): 2 [IU] via SUBCUTANEOUS
  Administered 2020-04-18: 1 [IU] via SUBCUTANEOUS
  Administered 2020-04-18 – 2020-04-19 (×7): 2 [IU] via SUBCUTANEOUS
  Administered 2020-04-19 (×2): 3 [IU] via SUBCUTANEOUS
  Administered 2020-04-20 (×3): 2 [IU] via SUBCUTANEOUS
  Administered 2020-04-20 – 2020-04-21 (×5): 3 [IU] via SUBCUTANEOUS
  Administered 2020-04-21: 2 [IU] via SUBCUTANEOUS
  Administered 2020-04-21: 3 [IU] via SUBCUTANEOUS
  Administered 2020-04-21: 1 [IU] via SUBCUTANEOUS
  Administered 2020-04-21: 2 [IU] via SUBCUTANEOUS
  Administered 2020-04-22: 1 [IU] via SUBCUTANEOUS
  Administered 2020-04-22: 2 [IU] via SUBCUTANEOUS
  Administered 2020-04-22: 1 [IU] via SUBCUTANEOUS
  Administered 2020-04-22 – 2020-04-23 (×4): 2 [IU] via SUBCUTANEOUS
  Administered 2020-04-23: 1 [IU] via SUBCUTANEOUS
  Administered 2020-04-23: 2 [IU] via SUBCUTANEOUS
  Administered 2020-04-23 – 2020-04-24 (×3): 1 [IU] via SUBCUTANEOUS
  Administered 2020-04-24 (×2): 2 [IU] via SUBCUTANEOUS
  Administered 2020-04-24: 1 [IU] via SUBCUTANEOUS
  Administered 2020-04-24: 2 [IU] via SUBCUTANEOUS
  Administered 2020-04-25: 1 [IU] via SUBCUTANEOUS
  Administered 2020-04-25: 2 [IU] via SUBCUTANEOUS
  Administered 2020-04-25: 1 [IU] via SUBCUTANEOUS
  Administered 2020-04-25 (×2): 2 [IU] via SUBCUTANEOUS
  Administered 2020-04-25 – 2020-04-26 (×2): 1 [IU] via SUBCUTANEOUS
  Administered 2020-04-26 (×3): 2 [IU] via SUBCUTANEOUS
  Administered 2020-04-26: 1 [IU] via SUBCUTANEOUS
  Administered 2020-04-27 (×2): 2 [IU] via SUBCUTANEOUS
  Administered 2020-04-27 (×2): 1 [IU] via SUBCUTANEOUS

## 2020-04-06 NOTE — Progress Notes (Signed)
Occupational Therapy Treatment Patient Details Name: Tasha [name] MRN: [id number] DOB: 1945/12/19 Today's Date: 04/06/2020    History of present illness Tasha [name] is a 74 y.o. 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 In hospital, R sided weakness not improving, MRI showed new acute infarcts of L pontine and post body R lateral ventricle. Pt had Gtube placed on 04/01/2020   OT comments  Patient continues to make steady progress towards goals in skilled OT session. Patient's session encompassed sitting EOB to focus on ROM, following commands, and sitting balance/tolerance. Pt unable to verbalize, demonstrate any visual attention, or follow any commands to date and requiring total A for all movement. When positioning pt through various planes on the EOB in order to engage trunk, pt noted to have no activation in trunk or extremities. Discharge remains appropriate at this time, will continue to follow acutely.    Follow Up Recommendations  SNF    Equipment Recommendations  None recommended by OT    Recommendations for Other Services      Precautions / Restrictions Precautions Precautions: Fall Precaution Comments: right hemiplegia, PEG Splint/Cast: bil resting hand splints- night wear only Restrictions Weight Bearing Restrictions: No       Mobility Bed Mobility Overal bed mobility: Needs Assistance Bed Mobility: Supine to Sit;Sit to Supine;Rolling Rolling: +2 for physical assistance;+2 for safety/equipment;Total assist   Supine to sit: Total assist;+2 for physical assistance;+2 for safety/equipment Sit to supine: Total assist;+2 for physical assistance;+2 for safety/equipment Sit to sidelying: Total assist;+2 for physical assistance General bed mobility comments: Total A of 2 with bed pad and positioning of extremities to facilitate all  transfers  Transfers                 General transfer comment: unable- would need maximove    Balance Overall balance assessment: Needs assistance Sitting-balance support: Feet supported;Bilateral upper extremity supported Sitting balance-Leahy Scale: Zero Sitting balance - Comments: Required total assist - worked on weight shifting , wbing through arms, neck ROM and gentle stretching.  Also performed mouth care and washing face at EOB with total assist.  Sat for 15 mins.                                   ADL either performed or assessed with clinical judgement   ADL Overall ADL's : Needs assistance/impaired     Grooming: Total assistance;Oral care;Wash/dry face;Wash/dry hands Grooming Details (indicate cue type and reason): total A in attempt to wipe mouth with wash cloth                             Functional mobility during ADLs: Total assistance;+2 for physical assistance;+2 for safety/equipment General ADL Comments: Session focus on ROM and sitting balance     Vision       Perception     Praxis      Cognition Arousal/Alertness: Lethargic Behavior During Therapy: Flat affect Overall Cognitive Status: Difficult to assess                                 General Comments: Pt not able to follow any commands today and with strong R gaze preference.  Did not  follow therapist with eyes today.        Exercises     Shoulder Instructions       General Comments      Pertinent Vitals/ Pain       Faces Pain Scale: Hurts a little bit Pain Location: grimacing with repositioning in bed  Pain Descriptors / Indicators: Grimacing;Discomfort;Moaning Pain Intervention(s): Monitored during session;Repositioned  Home Living                                          Prior Functioning/Environment              Frequency  Min 2X/week        Progress Toward Goals  OT Goals(current goals can now be  found in the care plan section)  Progress towards OT goals: Not progressing toward goals - comment (Did not follow commands, verbalize, or visually attend at any point during session)  Acute Rehab OT Goals Patient Stated Goal: Unable to state OT Goal Formulation: Patient unable to participate in goal setting Time For Goal Achievement: 04/14/20 Potential to Achieve Goals: Poor  Plan Discharge plan remains appropriate;Frequency remains appropriate    Co-evaluation    PT/OT/SLP Co-Evaluation/Treatment: Yes Reason for Co-Treatment: Complexity of the patient's impairments (multi-system involvement) PT goals addressed during session: Mobility/safety with mobility;Balance OT goals addressed during session: ADL's and self-care;Strengthening/ROM      AM-PAC OT "6 Clicks" Daily Activity     Outcome Measure   Help from another person eating meals?: Total Help from another person taking care of personal grooming?: Total Help from another person toileting, which includes using toliet, bedpan, or urinal?: Total Help from another person bathing (including washing, rinsing, drying)?: Total Help from another person to put on and taking off regular upper body clothing?: Total Help from another person to put on and taking off regular lower body clothing?: Total 6 Click Score: 6    End of Session    OT Visit Diagnosis: Unsteadiness on feet (R26.81);History of falling (Z91.81);Muscle weakness (generalized) (M62.81);Other symptoms and signs involving the nervous system (R29.898);Other symptoms and signs involving cognitive function;Cognitive communication deficit (R41.841);Other abnormalities of gait and mobility (R26.89)   Activity Tolerance Patient limited by lethargy   Patient Left in bed;with call bell/phone within reach;with bed alarm set;with nursing/sitter in room   Nurse Communication Mobility status        Time: 7893-8101 OT Time Calculation (min): 23 min  Charges: OT General  Charges $OT Visit: 1 Visit OT Treatments $Self Care/Home Management : 8-22 mins  Pollyann Glen E. Anisia Leija, COTA/L Acute Rehabilitation Services [phone]   [name] 04/06/2020, 3:25 PM

## 2020-04-06 NOTE — Progress Notes (Signed)
PROGRESS NOTE  Tasha [name]  DOB: 1945/09/12  PCP: Patient, No Pcp Per CNO:[id number]  DOA: 03/08/2020  LOS: 29 days   Chief Complaint  Patient presents with  . Emesis   Brief narrative: Patient is a 74 year old with a history of stroke, DM2, HTN, and MI who was discharged June24,2021 following a stroke work-up.   Patient presented back to the hospital on 03/08/2020 with 2-day his of intractable nausea, vomiting along and altered mental status.  On presentation, patient was found to be have lactic acidosis and acute kidney injury. Patient improved with supportive care. Due to persistent right-sided weakness that was felt to be worse than previously documented, an MRI was obtained on 03/14/20.   MRI brain revealed acute left pontine infarct and acute small infarct on the posterior body of the right lateral ventricle.  Neurology consultation obtained.  Work-up done. Patient was noted to have severe dysphagia, she was started on tube feeding.  She is still on tube feeding for several days in the hospital.  Finally she underwent PEG tube placement by IR on 8/4.  She was started on PEG tube feeding however she has had episodes of vomiting after that.  PEG tube feeding adjusted.  She also started on work-up for possibility of aspiration pneumonia. Palliative consultation appreciated.  However family is opting for full aggressive care.  Subjective: Patient was seen and examined this afternoon. Lying on bed.  Not in distress.  Opens eyes on verbal command.  Unable to follow directions or answer questions. No fever last 24 hours.  Heart rate mostly between 100 and 110. 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  -Per MRI on 7/18 with history of recent CVA on June 2021 -Patient was on clopidogrel 75 mg dailyprior to  admission. -With new stroke aspirin 81 mg daily was added to continue for 3 weeks till today 04/06/20. -After today, patient will continue Plavix alone. Continue Crestor.  Severe dysphagia -Currently has PEG tube with Glucerna 1.2 at 55 mill per hour.  Aspiration pneumonia -Patient started vomiting after PEG tube placement.  Also low-grade fever, elevated WBC count.  Started on IV Zosyn and IV vancomycin.   -No growth in blood culture.  Insignificant growth in urine culture -No fever in last 24 hours but WBC count remains elevated, slightly improved from 20,000 yesterday to 18,000 today.  Acute kidney injury -BUN/creatinine was significantly elevated to 149/1.5 on admission.  -With IV hydration, gradually improved, creatinine 1.57 today.  -Continue to monitor.  -Nephrology signed off.   Hypernatremia/hyponatremia: -Sodium was initially elevated, gradually improved, 137 today. -IV fluids stopped.  Activity started.  Monitor sodium level.  Acute combined toxic and metabolic encephalopathy: -Multifactorial : acute pontine/lacunarinfarct, dehydration, dyslipidemia, hospital induced delirium, -EEGunrevealing -TSH normal-B12 and folic acid not deficient -Continue to monitor mental status.  Acute urinary retention -8/9, required multiple in and out catheterization.  Foley catheter was inserted.  Severe constipation -Likely contributing to vomiting and residuals in PEG tube feeding. -8/8, abdominal x-ray showed rectal fecal impaction. -Patient had a bowel movement after enema.  Continue MiraLAX daily.  Noninsulin-dependent type II DM 2, controlled, A1c 6.9 -Metformin on hold.  Continue Levemir 10 units, every 4 hours sliding scale and Accu-Cheks.  -Metformin hold.    HTN lopressor to 100 mg twice a day, Norvasc 10 mg daily BP  stable  Depression: Patient was on low-dose Remeron but I stopped it yesterday because of altered mentation.  Goals of care: As per prior  documentation "Had a long discussion with the son on 03/23/20.  Given her debility, poor quality of life, I offered him palliative care consultation discuss about goals of care.   But her  son wants to continue full scope of treatment.remains full code" 04/05/2019: Patient's prognosis is guarded.    Palliative care has been peripherally following.  Nutrition Problem: Inadequate oral intake Etiology: lethargy/confusion Mobility: Limited Code Status:   Code Status: Full Code  Nutritional status: Body mass index is 22.49 kg/m. Nutrition Problem: Inadequate oral intake Etiology: lethargy/confusion Signs/Symptoms: other (comment) (per RN report) Diet Order            Diet NPO time specified  Diet effective midnight                 DVT prophylaxis: heparin injection 5,000 Units Start: 03/31/20 2200   Antimicrobials:  IV Zosyn, vancomycin Fluid: No IV fluid  Consultants: Palliative care Family Communication:  None at bedside  Status is: Inpatient  Remains inpatient appropriate because:Altered mental status and IV treatments appropriate due to intensity of illness or inability to take PO   Dispo:  Patient From:   SNF  Planned Disposition:   Back to SNF  Expected discharge date:   Probably next 2 to 3 days  Medically stable for discharge:   No    Infusions:  . feeding supplement (GLUCERNA 1.2 CAL) 1,000 mL (04/05/20 0337)  . piperacillin-tazobactam (ZOSYN)  IV 3.375 g (04/06/20 1328)  . vancomycin Stopped (04/05/20 1945)    Scheduled Meds: . chlorhexidine  15 mL Mouth Rinse BID  . Chlorhexidine Gluconate Cloth  6 each Topical Daily  . clopidogrel  75 mg Per Tube Daily  . heparin injection (subcutaneous)  5,000 Units Subcutaneous Q8H  . insulin aspart  0-15 Units Subcutaneous TID WC  . insulin aspart  5 Units Subcutaneous Q4H  . insulin detemir  10 Units Subcutaneous BID  . mouth rinse  15 mL Mouth Rinse BID  . pantoprazole sodium  40 mg Per Tube Daily  . polyethylene  glycol  17 g Per Tube BID  . rosuvastatin  5 mg Per Tube Daily    Antimicrobials: Anti-infectives (From admission, onward)   Start     Dose/Rate Route Frequency Ordered Stop   04/05/20 1800  vancomycin (VANCOREADY) IVPB 750 mg/150 mL     Discontinue     750 mg 150 mL/hr over 60 Minutes Intravenous Every 24 hours 04/04/20 1727     04/04/20 1730  vancomycin (VANCOREADY) IVPB 1250 mg/250 mL        1,250 mg 166.7 mL/hr over 90 Minutes Intravenous  Once 04/04/20 1727 04/04/20 1948   04/04/20 1730  piperacillin-tazobactam (ZOSYN) IVPB 3.375 g     Discontinue     3.375 g 12.5 mL/hr over 240 Minutes Intravenous Every 8 hours 04/04/20 1727     04/04/20 1715  piperacillin-tazobactam (ZOSYN) IVPB 3.375 g  Status:  Discontinued       Note to Pharmacy: Adjust as per renal function   3.375 g 100 mL/hr over 30 Minutes Intravenous Every 8 hours 04/04/20 1706 04/04/20 1726   03/31/20 1207  vancomycin (VANCOCIN) 1-5 GM/200ML-% IVPB       Note to Pharmacy: [name], [name]   : cabinet override      03/31/20 1207 03/31/20 1232   03/31/20 1200  vancomycin (  VANCOCIN) IVPB 1000 mg/200 mL premix        1,000 mg 200 mL/hr over 60 Minutes Intravenous  Once 03/31/20 1155 03/31/20 1509   03/10/20 1200  cefTRIAXone (ROCEPHIN) 2 g in sodium chloride 0.9 % 100 mL IVPB        2 g 200 mL/hr over 30 Minutes Intravenous Every 24 hours 03/10/20 0924 03/14/20 1333   03/09/20 1000  ceFEPIme (MAXIPIME) 1 g in sodium chloride 0.9 % 100 mL IVPB  Status:  Discontinued        1 g 200 mL/hr over 30 Minutes Intravenous Every 24 hours 03/08/20 1219 03/10/20 0924   03/08/20 1219  vancomycin variable dose per unstable renal function (pharmacist dosing)  Status:  Discontinued         Does not apply See admin instructions 03/08/20 1219 03/10/20 0906   03/08/20 1100  vancomycin (VANCOCIN) IVPB 1000 mg/200 mL premix        1,000 mg 200 mL/hr over 60 Minutes Intravenous  Once 03/08/20 1046 03/08/20 1406   03/08/20 1100  ceFEPIme  (MAXIPIME) 2 g in sodium chloride 0.9 % 100 mL IVPB        2 g 200 mL/hr over 30 Minutes Intravenous  Once 03/08/20 1046 03/08/20 1211      PRN meds: acetaminophen **OR** [DISCONTINUED] acetaminophen, lip balm, polyethylene glycol, Resource ThickenUp Clear   Objective: Vitals:   04/06/20 0742 04/06/20 1232  BP: (!) 132/53 (!) 124/51  Pulse: (!) 102 (!) 104  Resp: 16 18  Temp: 98.6 F (37 C) 98.9 F (37.2 C)  SpO2: 93% 94%    Intake/Output Summary (Last 24 hours) at 04/06/2020 1407 Last data filed at 04/06/2020 0630 Gross per 24 hour  Intake 1475.54 ml  Output 1500 ml  Net -24.46 ml   Filed Weights   04/04/20 0311 04/05/20 0325 04/06/20 0306  Weight: 59.4 kg 59.6 kg 59.4 kg   Weight change: -0.179 kg Body mass index is 22.49 kg/m.   Physical Exam: General exam: Lethargic.  Able to open eyes on verbal command.  Unable to have a conversation or show any movement for me Skin: No rashes, lesions or ulcers. HEENT: Atraumatic, normocephalic, supple neck, no obvious bleeding Lungs: Clear to auscultation bilaterally.   CVS: Regular rate and rhythm, no murmur GI/Abd soft, nontender, nondistended, active bleeding CNS: Lethargic Psychiatry: Depressed look Extremities: No pedal edema, no calf tenderness  Data Review: I have personally reviewed the laboratory data and studies available.  Recent Labs  Lab 03/31/20 0434 04/02/20 0432 04/04/20 0315 04/05/20 0352 04/06/20 0408  WBC 12.9* 14.5* 18.2* 18.1* 20.3*  NEUTROABS 9.3* 11.5* 15.3* 15.6* 16.4*  HGB 9.3* 8.5* 10.1* 7.7* 7.2*  HCT 28.5* 27.4* 30.6* 23.0* 22.7*  MCV 89.3 92.9 89.0 88.8 89.7  PLT 404* 386 637* 470* 392   Recent Labs  Lab 03/31/20 0434 04/02/20 0432 04/04/20 0315 04/05/20 0352 04/05/20 0942 04/06/20 0408  NA 135 138 128* 128*  --  137  K 4.1 4.1 4.7 4.2  --  3.6  CL 103 103 93* 94*  --  102  CO2 21* 19* 20* 19*  --  21*  GLUCOSE 100* 195* 219* 137*  --  115*  BUN 49* 63* 63* 77*  --  65*    CREATININE 1.21* 1.53* 1.42* 1.87*  --  1.57*  CALCIUM 9.2 9.1 9.7 9.2  --  9.1  MG  --   --  2.0 2.1  --  2.1  PHOS  --   --  3.7  --  4.5 4.1   Lab Results  Component Value Date   HGBA1C 6.9 (H) 02/15/2020       Component Value Date/Time   CHOL 223 (H) 02/16/2020 1400   TRIG 157 (H) 02/16/2020 1400   HDL 49 02/16/2020 1400   CHOLHDL 4.6 02/16/2020 1400   VLDL 31 02/16/2020 1400   LDLCALC 143 (H) 02/16/2020 1400    Signed, Lorin Glass, MD [facility] Pager: [phone] (Secure Chat preferred) 04/06/2020

## 2020-04-06 NOTE — Progress Notes (Signed)
Nutrition Follow-up  DOCUMENTATION CODES:   Not applicable  INTERVENTION:  Continue via PEG: -Glucerna 1.2 cal@ 63m/hr (13282m   Tube feeding will provide 1584kcals, 79grams protein, 106286mree water    NUTRITION DIAGNOSIS:   Inadequate oral intake related to lethargy/confusion as evidenced by other (comment) (per RN report).  Ongoing  GOAL:   Patient will meet greater than or equal to 90% of their needs  Met with TF  MONITOR:   PO intake, Diet advancement, TF tolerance, Labs, Weight trends  REASON FOR ASSESSMENT:   Consult Enteral/tube feeding initiation and management  ASSESSMENT:   Pt with a history of stroke in June, HTN, MI and DM presented 7/12 with N/V and AMS. Pt admitted w/ uremia and possible infection. In hospital, R sided weakness not improving, MRI showed new acute infarcts.  7/15 NGT placed (gastric), TF initiated 7/16 NGT advanced to LOT 7/17 diet advanced to full liquids, TF reduced by MD 7/23 NGT replaced with Cortrak (gastric) 7/26 pt made NPO by SLP due to dysphagia 8/4 s/p PEG  8/8 TFs held due to pt vomiting; abd xray revealed possible fecal impaction; CXR showed possible aspiration PNA 8/9 pt had BM; TF resumed  Discussed pt with RN. Per RN, TF is running again and pt is tolerating.   Pt was to d/c back to SNF after PEG placement; however, pt's facility has several COVID-19 pts that are on quarantine. Pt will d/c to facility once able.   Current TF via Cortrak: Glucerna 1.2 cal @ 92m89m  Labs: CBGs 94-133 (Diabetes Coordinator Following) Medications: Novolog, Levemir, Protonix, Miralax  Diet Order:   Diet Order            Diet NPO time specified  Diet effective midnight                 EDUCATION NEEDS:   No education needs have been identified at this time  Skin:  Skin Assessment: Skin Integrity Issues: Skin Integrity Issues:: Other (Comment) Other: MASD perineum  Last BM:  8/9  Height:   Ht Readings from  Last 1 Encounters:  03/08/20 '5\' 4"'$  (1.626 m)    Weight:   Wt Readings from Last 1 Encounters:  04/06/20 59.4 kg    BMI:  Body mass index is 22.49 kg/m.  Estimated Nutritional Needs:   Kcal:  1500-1700 kcal  Protein:  75-85 grams  Fluid:  >/= 1.8 L/day    AmanLarkin Ina, RD, LDN RD pager number and weekend/on-call pager number located in [place]

## 2020-04-06 NOTE — Progress Notes (Addendum)
Pt's CBG trending down while pt on conti TF. Pt due 5 units Novolog, CBG only 82. This RN not comfortable giving pt  scheduled insulin dose. Dr. Loney Loh paged. Care nurse did not give this dose. Will conti to monitor pt's CBGs. Returned call from MD at 2037 and V.O. per Dr. Loney Loh "ro hold scheduled Novolog 5 units and to repeat CBG before HS Levemir".

## 2020-04-06 NOTE — Plan of Care (Signed)

## 2020-04-06 NOTE — Progress Notes (Signed)
Physical Therapy Treatment Patient Details Name: Tasha [name] MRN: [id number] DOB: Nov 15, 1945 Today's Date: 04/06/2020    History of Present Illness Tasha [name] is a 74 y.o. 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 In hospital, R sided weakness not improving, MRI showed new acute infarcts of L pontine and post body R lateral ventricle. Pt had Gtube placed on 04/01/2020    PT Comments    Pt continues to have poor rehab prognosis.  She required total A x 2 for transfers to EOB and rolling and EOB balance.  Pt not able to following any commands or track with eyes today. Strong R gaze preference.  Used bed pad and positioning of extremities for facilitate transfers.     Follow Up Recommendations  SNF     Equipment Recommendations  None recommended by PT    Recommendations for Other Services       Precautions / Restrictions Precautions Precautions: Fall Precaution Comments: right hemiplegia, PEG Splint/Cast: bil resting hand splints- night wear only    Mobility  Bed Mobility Overal bed mobility: Needs Assistance Bed Mobility: Supine to Sit;Sit to Supine;Rolling Rolling: +2 for physical assistance;+2 for safety/equipment;Total assist   Supine to sit: Total assist;+2 for physical assistance;+2 for safety/equipment Sit to supine: Total assist;+2 for physical assistance;+2 for safety/equipment   General bed mobility comments: Total A of 2 with bed pad and positioning of extremities to facilitate all transfers  Transfers                 General transfer comment: unable- would need maximove  Ambulation/Gait                 Stairs             Wheelchair Mobility    Modified Rankin (Stroke Patients Only) Modified Rankin (Stroke Patients Only) Pre-Morbid Rankin Score: Severe disability Modified Rankin: Severe disability      Balance Overall balance assessment: Needs assistance Sitting-balance support: Feet supported;Bilateral upper extremity supported Sitting balance-Leahy Scale: Zero Sitting balance - Comments: Required total assist - worked on weight shifting , wbing through arms, neck ROM and gentle stretching.  Also performed mouth care and washing face at EOB with total assist.  Sat for 15 mins.                                    Cognition Arousal/Alertness: Lethargic Behavior During Therapy: Flat affect Overall Cognitive Status: Difficult to assess                                 General Comments: Pt not able to follow any commands today and with strong R gaze preference.  Did not follow therapist with eyes today.      Exercises General Exercises - Lower Extremity Ankle Circles/Pumps: PROM;10 reps;Supine;Both Long Arc Quad: PROM;Both;Seated;5 reps Heel Slides: PROM;Both;Supine;10 reps Hip ABduction/ADduction: Both;10 reps;Supine;PROM Other Exercises Other Exercises: Ankle DF stretch 3 x 30 sec each side(via pushing down through knee when sitting)    General Comments General comments (skin integrity, edema, etc.): Notified RN pt would benefit from Prevelon boots      Pertinent Vitals/Pain Pain Assessment: No/denies pain    Home Living  Prior Function            PT Goals (current goals can now be found in the care plan section) Acute Rehab PT Goals Patient Stated Goal: Unable to state PT Goal Formulation: Patient unable to participate in goal setting Time For Goal Achievement: 04/09/20 Potential to Achieve Goals: Poor Progress towards PT goals: Not progressing toward goals - comment (still required total x 2; limited by medical condition)    Frequency    Min 2X/week      PT Plan Frequency needs to be updated    Co-evaluation PT/OT/SLP Co-Evaluation/Treatment: Yes Reason for Co-Treatment: Complexity of the patient's  impairments (multi-system involvement) PT goals addressed during session: Mobility/safety with mobility;Balance OT goals addressed during session: ADL's and self-care;Strengthening/ROM      AM-PAC PT "6 Clicks" Mobility   Outcome Measure  Help needed turning from your back to your side while in a flat bed without using bedrails?: Total Help needed moving from lying on your back to sitting on the side of a flat bed without using bedrails?: Total Help needed moving to and from a bed to a chair (including a wheelchair)?: Total Help needed standing up from a chair using your arms (e.g., wheelchair or bedside chair)?: Total Help needed to walk in hospital room?: Total Help needed climbing 3-5 steps with a railing? : Total 6 Click Score: 6    End of Session   Activity Tolerance: Other (comment) (limited by medical condition and abitliy to follow commands) Patient left: in bed;with bed alarm set;with call bell/phone within reach Nurse Communication: Mobility status;Other (comment) (recommend Prevelon boots) PT Visit Diagnosis: Hemiplegia and hemiparesis;Other symptoms and signs involving the nervous system (R29.898);History of falling (Z91.81);Muscle weakness (generalized) (M62.81) Hemiplegia - Right/Left: Right Hemiplegia - dominant/non-dominant: Dominant Hemiplegia - caused by: Cerebral infarction     Time: 7341-9379 PT Time Calculation (min) (ACUTE ONLY): 23 min  Charges:  $Therapeutic Activity: 8-22 mins                     Anise Salvo, PT Acute Rehab Services Pager [phone] [facility] [phone]     Rayetta Humphrey 04/06/2020, 11:02 AM

## 2020-04-07 LAB — CBC WITH DIFFERENTIAL/PLATELET
Abs Immature Granulocytes: 0.91 10*3/uL — ABNORMAL HIGH (ref 0.00–0.07)
Basophils Absolute: 0.1 10*3/uL (ref 0.0–0.1)
Basophils Relative: 0 %
Eosinophils Absolute: 0.5 10*3/uL (ref 0.0–0.5)
Eosinophils Relative: 3 %
HCT: 23 % — ABNORMAL LOW (ref 36.0–46.0)
Hemoglobin: 7.2 g/dL — ABNORMAL LOW (ref 12.0–15.0)
Immature Granulocytes: 5 %
Lymphocytes Relative: 8 %
Lymphs Abs: 1.5 10*3/uL (ref 0.7–4.0)
MCH: 28.6 pg (ref 26.0–34.0)
MCHC: 31.3 g/dL (ref 30.0–36.0)
MCV: 91.3 fL (ref 80.0–100.0)
Monocytes Absolute: 0.7 10*3/uL (ref 0.1–1.0)
Monocytes Relative: 4 %
Neutro Abs: 13.8 10*3/uL — ABNORMAL HIGH (ref 1.7–7.7)
Neutrophils Relative %: 80 %
Platelets: 404 10*3/uL — ABNORMAL HIGH (ref 150–400)
RBC: 2.52 MIL/uL — ABNORMAL LOW (ref 3.87–5.11)
RDW: 16 % — ABNORMAL HIGH (ref 11.5–15.5)
WBC: 17.4 10*3/uL — ABNORMAL HIGH (ref 4.0–10.5)
nRBC: 0 % (ref 0.0–0.2)

## 2020-04-07 LAB — BASIC METABOLIC PANEL
Anion gap: 14 (ref 5–15)
BUN: 50 mg/dL — ABNORMAL HIGH (ref 8–23)
CO2: 21 mmol/L — ABNORMAL LOW (ref 22–32)
Calcium: 8.9 mg/dL (ref 8.9–10.3)
Chloride: 107 mmol/L (ref 98–111)
Creatinine, Ser: 1.22 mg/dL — ABNORMAL HIGH (ref 0.44–1.00)
GFR calc Af Amer: 51 mL/min — ABNORMAL LOW (ref >60)
GFR calc non Af Amer: 44 mL/min — ABNORMAL LOW (ref >60)
Glucose, Bld: 176 mg/dL — ABNORMAL HIGH (ref 70–99)
Potassium: 3.8 mmol/L (ref 3.5–5.1)
Sodium: 142 mmol/L (ref 135–145)

## 2020-04-07 LAB — GLUCOSE, CAPILLARY
Glucose-Capillary: 119 mg/dL — ABNORMAL HIGH (ref 70–99)
Glucose-Capillary: 134 mg/dL — ABNORMAL HIGH (ref 70–99)
Glucose-Capillary: 171 mg/dL — ABNORMAL HIGH (ref 70–99)
Glucose-Capillary: 172 mg/dL — ABNORMAL HIGH (ref 70–99)
Glucose-Capillary: 182 mg/dL — ABNORMAL HIGH (ref 70–99)
Glucose-Capillary: 189 mg/dL — ABNORMAL HIGH (ref 70–99)
Glucose-Capillary: 198 mg/dL — ABNORMAL HIGH (ref 70–99)

## 2020-04-07 MED ORDER — METOPROLOL TARTRATE 25 MG/10 ML ORAL SUSPENSION
25.0000 mg | Freq: Two times a day (BID) | ORAL | Status: DC
  Administered 2020-04-07 – 2020-04-27 (×39): 25 mg
  Filled 2020-04-07 (×41): qty 10

## 2020-04-07 MED ORDER — METOPROLOL TARTRATE 25 MG PO TABS
25.0000 mg | ORAL_TABLET | Freq: Two times a day (BID) | ORAL | Status: DC
  Administered 2020-04-07: 25 mg via ORAL
  Filled 2020-04-07: qty 1

## 2020-04-07 NOTE — Progress Notes (Signed)
Pharmacy Antibiotic Note  Tasha [name] is a 74 y.o. female admitted on 03/08/2020 with pneumonia.  Pharmacy has been consulted for vancomycin dosing.  Last MRSA PCR negative, d/w Dr. Pola Corn. Will d/c vancomycin at this time.   Plan: Discontinue Vancomycin  750 mg q 24 hrs.  Height: 5\' 4"  (162.6 cm) Weight: 59.5 kg (131 lb 2.8 oz) IBW/kg (Calculated) : 54.7  Temp (24hrs), Avg:99.2 F (37.3 C), Min:98.8 F (37.1 C), Max:99.5 F (37.5 C)  Recent Labs  Lab 04/02/20 0432 04/04/20 0315 04/05/20 0352 04/06/20 0408 04/07/20 0555  WBC 14.5* 18.2* 18.1* 20.3* 17.4*  CREATININE 1.53* 1.42* 1.87* 1.57* 1.22*    Estimated Creatinine Clearance: 35.5 mL/min (A) (by C-G formula based on SCr of 1.22 mg/dL (H)).    Allergies  Allergen Reactions  . Allopurinol Shortness Of Breath  . Tetracycline Anaphylaxis  . Amoxicillin-Pot Clavulanate Diarrhea  . Atorvastatin Other (See Comments)    Unknown reaction - reported by [facility], [facility] clinic 05/10/2012  . Azithromycin Other (See Comments)    Caused blisters inside and out  . Codeine Other (See Comments)    Altered mental status  . Lisinopril Other (See Comments)    Unknown reaction - reported by [facility], [facility] clinic 12/23/2008 02/20/2020 she believes "reaction" was NP cough. No PMH of angioedema  . Sulfa Antibiotics Diarrhea      Keishon Chavarin A. 02/22/2020, PharmD, BCPS, FNKF Clinical Pharmacist [place] Please utilize Amion for appropriate phone number to reach the unit pharmacist [facility])

## 2020-04-07 NOTE — Progress Notes (Addendum)
PROGRESS NOTE  Calleigh [name]  DOB: [birth year]  PCP: Patient, No Pcp Per UXN:[id number]  DOA: 03/08/2020  LOS: 30 days   Chief Complaint  Patient presents with  . Emesis   Brief narrative: Patient is a 74 year old with a history of stroke, DM2, HTN, and MI who was discharged June24,2021 following a stroke work-up.   Patient presented back to the hospital on 03/08/2020 with 2-day his of intractable nausea, vomiting along and altered mental status.  On presentation, patient was found to be have lactic acidosis and acute kidney injury. Patient improved with supportive care. Due to persistent right-sided weakness that was felt to be worse than previously documented, an MRI was obtained on 03/14/20.   MRI brain revealed acute left pontine infarct and acute small infarct on the posterior body of the right lateral ventricle.  Neurology consultation obtained.  Work-up done. Patient was noted to have severe dysphagia, she was started on tube feeding.  She is still on tube feeding for several days in the hospital.  Finally she underwent PEG tube placement by IR on 8/4.  She was started on PEG tube feeding however she has had episodes of vomiting after that.  PEG tube feeding adjusted.  She also started on work-up for possibility of aspiration pneumonia. Palliative consultation appreciated.  However family is opting for full aggressive care.  Subjective: Patient was seen and examined this morning.  Propped up in bed.  Opens eyes on verbal command.  Unable to follow any verbal or motor commands. No fever in last 24 hours, remains on 2 L oxygen by nasal cannula. Labs from this morning with WBC count improving gradually, 17.4 today.  Creatinine down to 1.22, hemoglobin remains low but stable at 7.2.  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  -Per MRI on 7/18 with history of recent CVA on June 2021 -Patient was on  clopidogrel 75 mg dailyprior to admission.  Aspirin 81 mg daily with in this admission. -Per neurology note from 7/20, patient was kept on DAPT for 3 weeks till 8/10.  Since then, patient is on Plavix only.  -Continue Crestor.  Severe dysphagia -Currently has PEG tube with Glucerna 1.2 at 55 mill per hour.  Aspiration pneumonia -Patient started vomiting after PEG tube placement.  Also low-grade fever, elevated WBC count.   -No growth in blood culture.  Insignificant growth in urine culture -No fever in last 24 hours.  WBC count gradually improving, 17.4 today.  -Okay to stop vancomycin.  Continue IV Zosyn for now. -Continue to monitor leukocytes.  Acute kidney injury -BUN/creatinine was significantly elevated to 149/1.5 on admission.  -With IV hydration, gradually improved, creatinine 1.22 today.  -Continue to monitor.  -Nephrology signed off.   Hypernatremia/hyponatremia: -Sodium was initially elevated, gradually improved, 142 today.  I see a rising trend from 130 2 days ago to 142 today.  -Monitor sodium level.  Acute combined toxic and metabolic encephalopathy: -Multifactorial : acute pontine/lacunarinfarct, dehydration, dyslipidemia, hospital induced delirium, -EEGunrevealing -TSH normal-B12 and folic acid not deficient -Continue to monitor mental status.  Acute urinary retention -8/9, required multiple in and out catheterization.  Foley catheter was inserted.  Severe constipation -Likely contributing to vomiting and residuals in PEG tube feeding. -8/8, abdominal x-ray showed rectal fecal impaction. -Patient had a bowel movement after enema.  Continue MiraLAX daily.  Noninsulin-dependent type II DM 2, controlled, A1c 6.9 -Metformin on hold.  Continue Levemir 10 units, every 4  hours sliding scale and Accu-Cheks.  -Metformin hold.    HTN -Home medications include amlodipine 10 mg daily, HCTZ 25 mg daily, metoprolol 25 mg twice daily.   -Currently not on any  blood pressure medication.  Blood pressure in last 24 hours seems to be trending up, I would resume on blood pressure lopressor 25 mg twice daily today.  Continue to monitor blood pressure.  Depression: Patient was on low-dose Remeron but I stopped it yesterday because of altered mentation.  Goals of care:  I called and discussed patient's condition, and most likely natural course with her daughter Mr. Tasha [name].  We had a long conversation about her CODE STATUS as well.  Given her debility, poor quality of life and unlikely return back to her baseline functionality, in my medical opinion, patient would be appropriate for DNR status at least.  Patient's son however states that patient had explicitly told him in the past that she would like to be kept alive 'no matter what'.  He said he would hence leave her full code.  He said he is in process of getting guardianship/power of attorney.   Nutrition Problem: Inadequate oral intake Etiology: lethargy/confusion Mobility: Limited Code Status:   Code Status: Full Code  Nutritional status: Body mass index is 22.52 kg/m. Nutrition Problem: Inadequate oral intake Etiology: lethargy/confusion Signs/Symptoms: other (comment) (per RN report) Diet Order            Diet NPO time specified  Diet effective midnight                 DVT prophylaxis: heparin injection 5,000 Units Start: 03/31/20 2200   Antimicrobials:  IV Zosyn, vancomycin Fluid: No IV fluid  Consultants: Palliative care Family Communication:  None at bedside  Status is: Inpatient  Remains inpatient appropriate because:Altered mental status and IV treatments appropriate due to intensity of illness or inability to take PO  Dispo:  Patient From:   SNF  Planned Disposition:   Back to SNF  Expected discharge date:   Probably next 1 to 2 days  Medically stable for discharge:   No  Infusions:  . feeding supplement (GLUCERNA 1.2 CAL) 1,000 mL (04/07/20 1306)  .  piperacillin-tazobactam (ZOSYN)  IV 3.375 g (04/07/20 1228)    Scheduled Meds: . chlorhexidine  15 mL Mouth Rinse BID  . Chlorhexidine Gluconate Cloth  6 each Topical Daily  . clopidogrel  75 mg Per Tube Daily  . heparin injection (subcutaneous)  5,000 Units Subcutaneous Q8H  . insulin aspart  0-9 Units Subcutaneous Q4H  . insulin detemir  10 Units Subcutaneous BID  . mouth rinse  15 mL Mouth Rinse BID  . pantoprazole sodium  40 mg Per Tube Daily  . polyethylene glycol  17 g Per Tube BID  . rosuvastatin  5 mg Per Tube Daily    Antimicrobials: Anti-infectives (From admission, onward)   Start     Dose/Rate Route Frequency Ordered Stop   04/05/20 1800  vancomycin (VANCOREADY) IVPB 750 mg/150 mL  Status:  Discontinued        750 mg 150 mL/hr over 60 Minutes Intravenous Every 24 hours 04/04/20 1727 04/07/20 0805   04/04/20 1730  vancomycin (VANCOREADY) IVPB 1250 mg/250 mL        1,250 mg 166.7 mL/hr over 90 Minutes Intravenous  Once 04/04/20 1727 04/04/20 1948   04/04/20 1730  piperacillin-tazobactam (ZOSYN) IVPB 3.375 g     Discontinue     3.375 g 12.5 mL/hr over 240  Minutes Intravenous Every 8 hours 04/04/20 1727     04/04/20 1715  piperacillin-tazobactam (ZOSYN) IVPB 3.375 g  Status:  Discontinued       Note to Pharmacy: Adjust as per renal function   3.375 g 100 mL/hr over 30 Minutes Intravenous Every 8 hours 04/04/20 1706 04/04/20 1726   03/31/20 1207  vancomycin (VANCOCIN) 1-5 GM/200ML-% IVPB       Note to Pharmacy: [name], [name]   : cabinet override      03/31/20 1207 03/31/20 1232   03/31/20 1200  vancomycin (VANCOCIN) IVPB 1000 mg/200 mL premix        1,000 mg 200 mL/hr over 60 Minutes Intravenous  Once 03/31/20 1155 03/31/20 1509   03/10/20 1200  cefTRIAXone (ROCEPHIN) 2 g in sodium chloride 0.9 % 100 mL IVPB        2 g 200 mL/hr over 30 Minutes Intravenous Every 24 hours 03/10/20 0924 03/14/20 1333   03/09/20 1000  ceFEPIme (MAXIPIME) 1 g in sodium chloride 0.9 % 100 mL  IVPB  Status:  Discontinued        1 g 200 mL/hr over 30 Minutes Intravenous Every 24 hours 03/08/20 1219 03/10/20 0924   03/08/20 1219  vancomycin variable dose per unstable renal function (pharmacist dosing)  Status:  Discontinued         Does not apply See admin instructions 03/08/20 1219 03/10/20 0906   03/08/20 1100  vancomycin (VANCOCIN) IVPB 1000 mg/200 mL premix        1,000 mg 200 mL/hr over 60 Minutes Intravenous  Once 03/08/20 1046 03/08/20 1406   03/08/20 1100  ceFEPIme (MAXIPIME) 2 g in sodium chloride 0.9 % 100 mL IVPB        2 g 200 mL/hr over 30 Minutes Intravenous  Once 03/08/20 1046 03/08/20 1211      PRN meds: acetaminophen **OR** [DISCONTINUED] acetaminophen, lip balm, polyethylene glycol, Resource ThickenUp Clear   Objective: Vitals:   04/07/20 0806 04/07/20 1100  BP: (!) 145/62 (!) 163/73  Pulse: (!) 103 98  Resp: 20 (!) 22  Temp: 98.7 F (37.1 C) 99 F (37.2 C)  SpO2: 97% 93%    Intake/Output Summary (Last 24 hours) at 04/07/2020 1332 Last data filed at 04/07/2020 0745 Gross per 24 hour  Intake 3064.39 ml  Output 3400 ml  Net -335.61 ml   Filed Weights   04/05/20 0325 04/06/20 0306 04/07/20 0330  Weight: 59.6 kg 59.4 kg 59.5 kg   Weight change: 0.079 kg Body mass index is 22.52 kg/m.   Physical Exam: General exam: Lethargic.  Able to open eyes on verbal command.  Unable to have a conversation or show any movement for me Skin: No rashes, lesions or ulcers. HEENT: Atraumatic, normocephalic, supple neck, no obvious bleeding Lungs: Clear to auscultation bilaterally CVS: Regular rate and rhythm, no murmur GI/Abd soft, nontender, nondistended, active bleeding CNS: Lethargic Psychiatry: Depressed look Extremities: No pedal edema, no calf tenderness  Data Review: I have personally reviewed the laboratory data and studies available.  Recent Labs  Lab 04/02/20 0432 04/04/20 0315 04/05/20 0352 04/06/20 0408 04/07/20 0555  WBC 14.5* 18.2* 18.1*  20.3* 17.4*  NEUTROABS 11.5* 15.3* 15.6* 16.4* 13.8*  HGB 8.5* 10.1* 7.7* 7.2* 7.2*  HCT 27.4* 30.6* 23.0* 22.7* 23.0*  MCV 92.9 89.0 88.8 89.7 91.3  PLT 386 637* 470* 392 404*   Recent Labs  Lab 04/02/20 0432 04/04/20 0315 04/05/20 0352 04/05/20 0942 04/06/20 0408 04/07/20 0555  NA 138 128* 128*  --  137 142  K 4.1 4.7 4.2  --  3.6 3.8  CL 103 93* 94*  --  102 107  CO2 19* 20* 19*  --  21* 21*  GLUCOSE 195* 219* 137*  --  115* 176*  BUN 63* 63* 77*  --  65* 50*  CREATININE 1.53* 1.42* 1.87*  --  1.57* 1.22*  CALCIUM 9.1 9.7 9.2  --  9.1 8.9  MG  --  2.0 2.1  --  2.1  --   PHOS  --  3.7  --  4.5 4.1  --    Lab Results  Component Value Date   HGBA1C 6.9 (H) 02/15/2020       Component Value Date/Time   CHOL 223 (H) 02/16/2020 1400   TRIG 157 (H) 02/16/2020 1400   HDL 49 02/16/2020 1400   CHOLHDL 4.6 02/16/2020 1400   VLDL 31 02/16/2020 1400   LDLCALC 143 (H) 02/16/2020 1400    Signed, Lorin Glass, MD [facility] Pager: [phone] (Secure Chat preferred) 04/07/2020

## 2020-04-08 ENCOUNTER — Inpatient Hospital Stay (HOSPITAL_COMMUNITY): Payer: Medicare Other

## 2020-04-08 LAB — CBC WITH DIFFERENTIAL/PLATELET
Abs Immature Granulocytes: 1.42 10*3/uL — ABNORMAL HIGH (ref 0.00–0.07)
Basophils Absolute: 0.1 10*3/uL (ref 0.0–0.1)
Basophils Relative: 1 %
Eosinophils Absolute: 0.5 10*3/uL (ref 0.0–0.5)
Eosinophils Relative: 2 %
HCT: 23.3 % — ABNORMAL LOW (ref 36.0–46.0)
Hemoglobin: 7.8 g/dL — ABNORMAL LOW (ref 12.0–15.0)
Immature Granulocytes: 7 %
Lymphocytes Relative: 10 %
Lymphs Abs: 2 10*3/uL (ref 0.7–4.0)
MCH: 29.4 pg (ref 26.0–34.0)
MCHC: 33.5 g/dL (ref 30.0–36.0)
MCV: 87.9 fL (ref 80.0–100.0)
Monocytes Absolute: 0.9 10*3/uL (ref 0.1–1.0)
Monocytes Relative: 5 %
Neutro Abs: 15 10*3/uL — ABNORMAL HIGH (ref 1.7–7.7)
Neutrophils Relative %: 75 %
Platelets: 396 10*3/uL (ref 150–400)
RBC: 2.65 MIL/uL — ABNORMAL LOW (ref 3.87–5.11)
RDW: 15.9 % — ABNORMAL HIGH (ref 11.5–15.5)
WBC: 20 10*3/uL — ABNORMAL HIGH (ref 4.0–10.5)
nRBC: 0 % (ref 0.0–0.2)

## 2020-04-08 LAB — GLUCOSE, CAPILLARY
Glucose-Capillary: 145 mg/dL — ABNORMAL HIGH (ref 70–99)
Glucose-Capillary: 148 mg/dL — ABNORMAL HIGH (ref 70–99)
Glucose-Capillary: 169 mg/dL — ABNORMAL HIGH (ref 70–99)
Glucose-Capillary: 170 mg/dL — ABNORMAL HIGH (ref 70–99)
Glucose-Capillary: 185 mg/dL — ABNORMAL HIGH (ref 70–99)
Glucose-Capillary: 202 mg/dL — ABNORMAL HIGH (ref 70–99)
Glucose-Capillary: 202 mg/dL — ABNORMAL HIGH (ref 70–99)

## 2020-04-08 LAB — BASIC METABOLIC PANEL
Anion gap: 16 — ABNORMAL HIGH (ref 5–15)
BUN: 41 mg/dL — ABNORMAL HIGH (ref 8–23)
CO2: 20 mmol/L — ABNORMAL LOW (ref 22–32)
Calcium: 9.1 mg/dL (ref 8.9–10.3)
Chloride: 108 mmol/L (ref 98–111)
Creatinine, Ser: 1.17 mg/dL — ABNORMAL HIGH (ref 0.44–1.00)
GFR calc Af Amer: 54 mL/min — ABNORMAL LOW (ref >60)
GFR calc non Af Amer: 46 mL/min — ABNORMAL LOW (ref >60)
Glucose, Bld: 166 mg/dL — ABNORMAL HIGH (ref 70–99)
Potassium: 3.7 mmol/L (ref 3.5–5.1)
Sodium: 144 mmol/L (ref 135–145)

## 2020-04-08 IMAGING — CT CT CHEST W/O CM
2 of 5 series · 13 of 46 positions shown, 15 images · non-contrast
Comparison: CT abdomen dated [DATE].

CLINICAL DATA: Fever, leukocytosis. Pneumonia, effusion or abscess
suspected.

EXAM:
CT CHEST, ABDOMEN AND PELVIS WITHOUT CONTRAST
TECHNIQUE: Multidetector CT imaging of the chest, abdomen and pelvis was
performed following the standard protocol without IV contrast.

[Series 5: cap w/o 2.0 mm st · axial · non-contrast · 0.79mm/px · z∈[+579,+1133]mm · 10 of 317 slices shown, 12 images]
[im 20/317  soft-tissue]
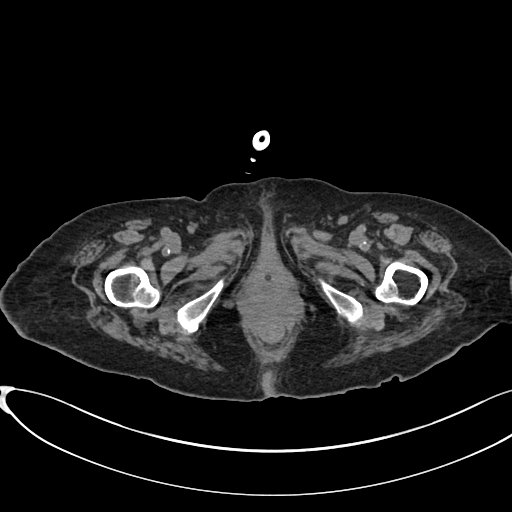
[im 20/317  bone]
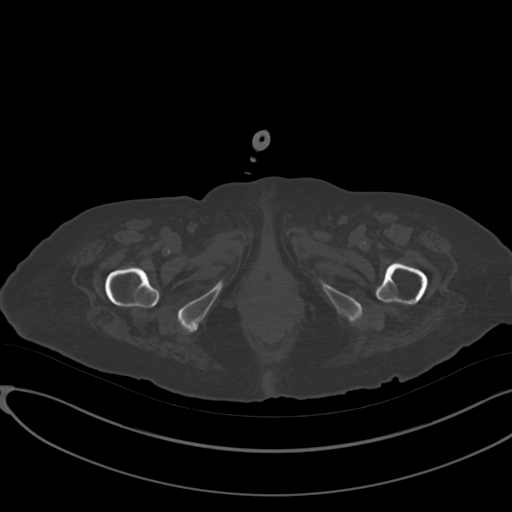
[im 60/317  soft-tissue]
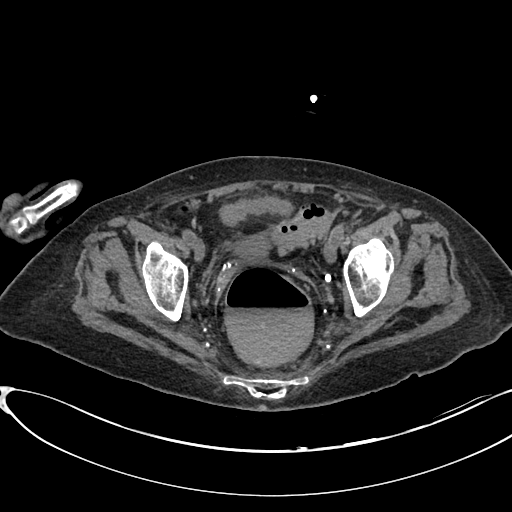
[im 80/317  soft-tissue]
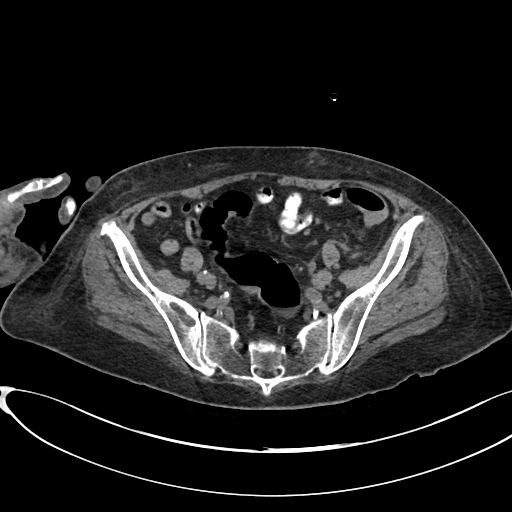
[im 119/317  soft-tissue]
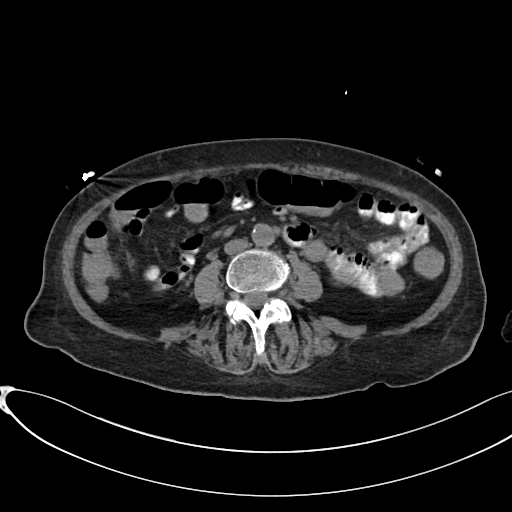
[im 139/317  soft-tissue]
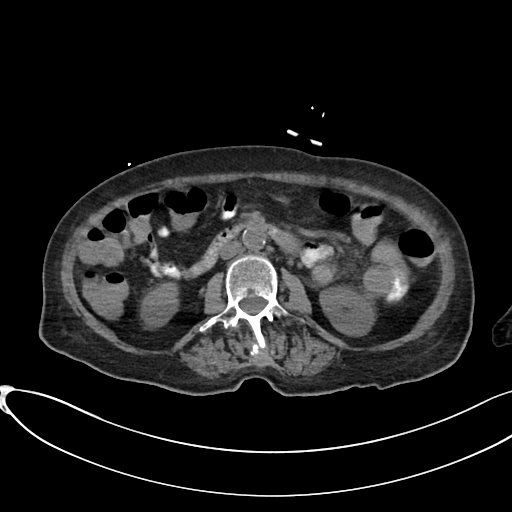
[im 178/317  soft-tissue]
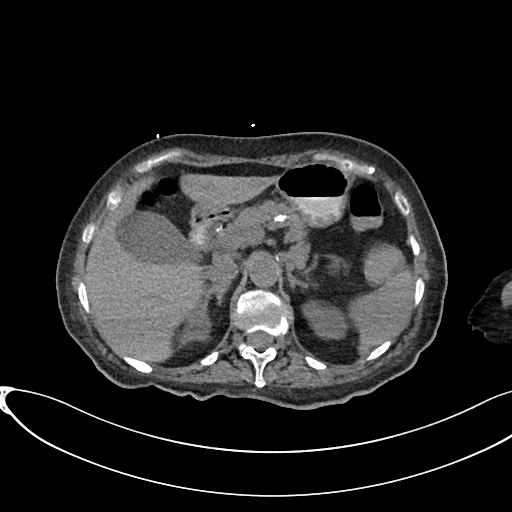
[im 198/317  soft-tissue]
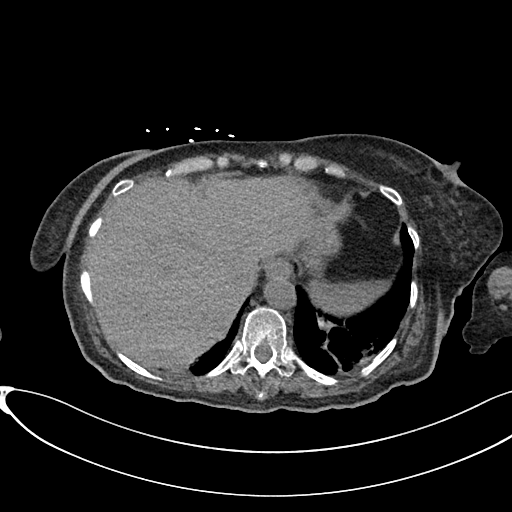
[im 238/317  soft-tissue]
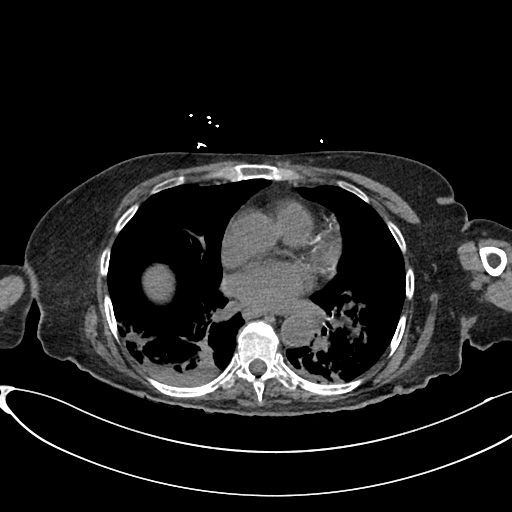
[im 257/317  soft-tissue]
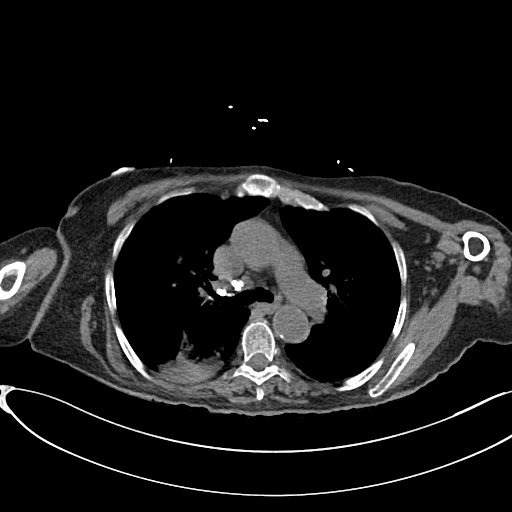
[im 257/317  bone]
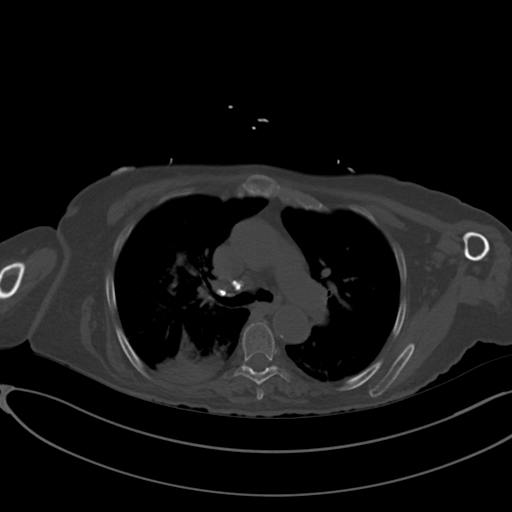
[im 297/317  soft-tissue]
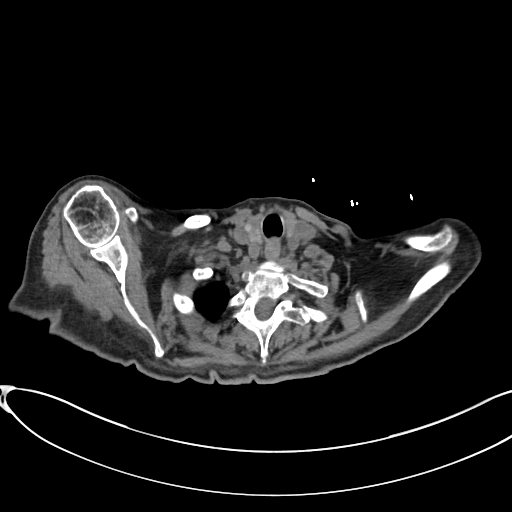

[Series 7: cap w/o 3.0 mm st cor · coronal · non-contrast · 0.74mm/px · 3 of 93 slices shown]
[im 31/93  soft-tissue]
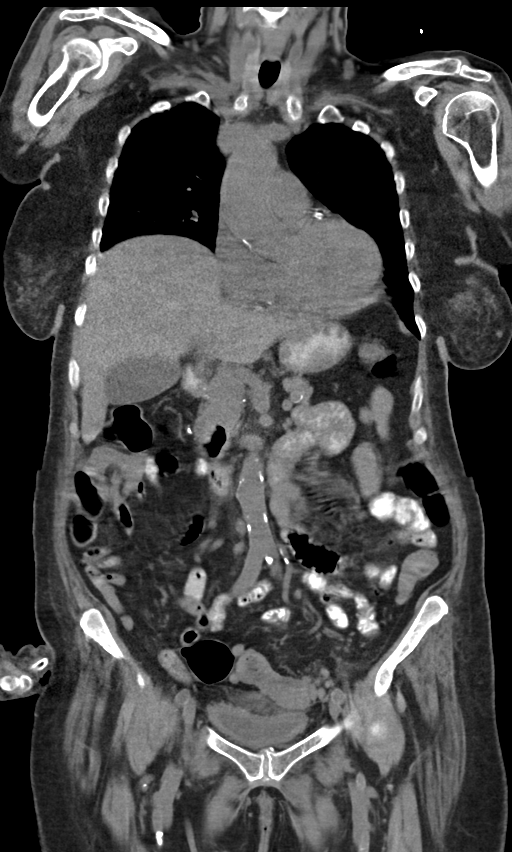
[im 41/93  soft-tissue]
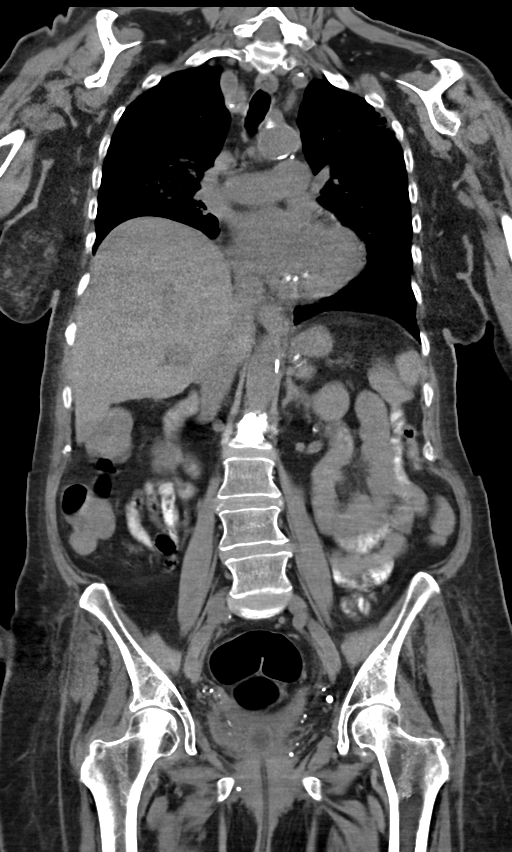
[im 52/93  soft-tissue]
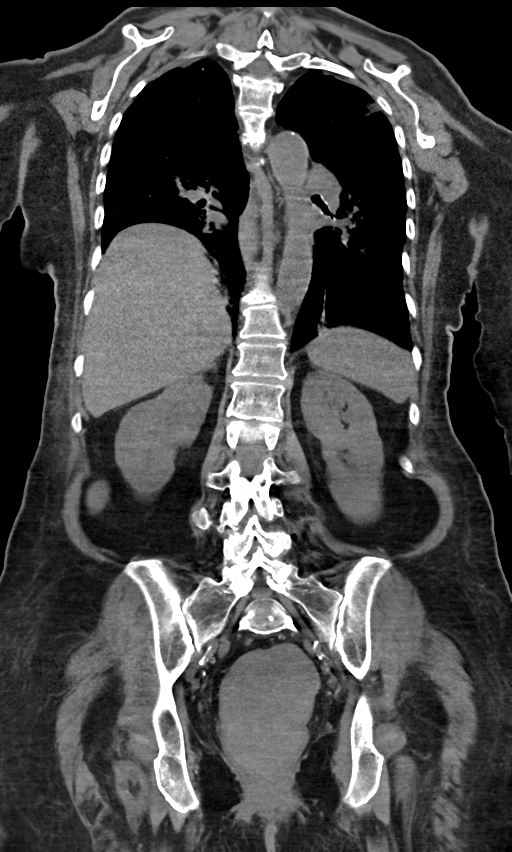

[13 of 46 positions shown; findings below may reference images not displayed]

FINDINGS: CT CHEST FINDINGS

Cardiovascular: No thoracic aortic aneurysm. No pericardial
effusion. Thoracic aortic atherosclerosis. Coronary artery
calcifications, particularly dense within the LEFT anterior
descending coronary artery.

Mediastinum/Nodes: No mass or enlarged lymph nodes are seen within
the mediastinum. Esophagus is unremarkable. Trachea appears normal.

Lungs/Pleura: Dense consolidations at the bilateral lung bases,
RIGHT greater than LEFT. Additional patchy ground-glass opacities
within the peripheral portions of each lung, most prominent within
the upper lobes and LEFT lower lobe. No pleural effusion is seen. No
pneumothorax.

Musculoskeletal: No acute or suspicious osseous finding. Masslike
density within the outer LEFT breast, of uncertain significance.

CT ABDOMEN PELVIS FINDINGS

Hepatobiliary: No focal liver abnormality. Gallbladder is
unremarkable. No bile duct dilatation.

Pancreas: Unremarkable. No pancreatic ductal dilatation or
surrounding inflammatory changes.

Spleen: Normal in size without focal abnormality.

Adrenals/Urinary Tract: Adrenal glands appear normal. Kidneys are
unremarkable without mass, stone or hydronephrosis. No perinephric
fluid. Bladder is decompressed by Foley catheter.

Stomach/Bowel: No dilated large or small bowel loops. Fluid, and
associated air-fluid levels scattered throughout the large and small
bowel, suggesting ileus. Diverticulosis of the sigmoid and
descending colon but no focal inflammatory change to suggest acute
diverticulitis. Gastrostomy tube appears appropriately positioned.

Vascular/Lymphatic: Aortic atherosclerosis. No enlarged lymph nodes
seen.

Reproductive: No adnexal mass or free fluid

Other: No free fluid or abscess collection is seen within the
abdomen or pelvis. No free intraperitoneal air.

Musculoskeletal: No acute or suspicious osseous finding. Surgical
changes of kyphoplasty/vertebroplasty at the L2 vertebral body
level.
IMPRESSION: 1. Dense consolidations at the bilateral lung bases, RIGHT greater
than LEFT, consistent with multifocal pneumonia and/or aspiration
pneumonitis.
2. Additional patchy ground-glass opacities within the peripheral
portions of each lung, most prominent within the upper lobes and
LEFT lower lobe. Differential includes atypical pneumonias such as
viral or fungal, interstitial pneumonias, edema related to volume
overload/CHF, chronic interstitial diseases, hypersensitivity
pneumonitis, and respiratory bronchiolitis. [EL] pneumonia can
have this appearance.
3. Masslike density within the outer LEFT breast, of uncertain
significance, neoplastic mass not excluded. Recommend correlation
with diagnostic mammogram at a dedicated [REDACTED] after current
issues are resolved.
4. Colonic diverticulosis without evidence of acute diverticulitis.
5. Fluid, and associated air-fluid levels, scattered throughout the
large and small bowel, suggesting ileus. No evidence of bowel
obstruction.
6. No free fluid or abscess collection is seen within the abdomen or
pelvis. No free intraperitoneal air.

Aortic Atherosclerosis ([EL]-[EL]).

## 2020-04-08 MED ORDER — IOHEXOL 9 MG/ML PO SOLN
500.0000 mL | ORAL | Status: AC
  Administered 2020-04-08 (×2): 500 mL via ORAL

## 2020-04-08 NOTE — Plan of Care (Signed)

## 2020-04-08 NOTE — Progress Notes (Signed)
PROGRESS NOTE  Tasha [name]  DOB: Jun 03, 1946  PCP: Patient, No Pcp Per [facility]:[id number]  DOA: 03/08/2020  LOS: 31 days   Chief Complaint  Patient presents with  . Emesis   Brief narrative: Patient is a 74 year old with a history of stroke, DM2, HTN, and MI who was discharged June24,2021 following a stroke work-up.   Patient presented back to the hospital on 03/08/2020 with 2-day his of intractable nausea, vomiting along and altered mental status.  On presentation, patient was found to be have lactic acidosis and acute kidney injury. Patient improved with supportive care. Due to persistent right-sided weakness that was felt to be worse than previously documented, an MRI was obtained on 03/14/20.   MRI brain revealed acute left pontine infarct and acute small infarct on the posterior body of the right lateral ventricle.  Neurology consultation obtained.  Work-up done. Patient was noted to have severe dysphagia, she was started on tube feeding.  She is still on tube feeding for several days in the hospital.  Finally she underwent PEG tube placement by IR on 8/4.  She was started on PEG tube feeding however she has had episodes of vomiting after that.  PEG tube feeding adjusted.  She also started on work-up for possibility of aspiration pneumonia. Palliative consultation appreciated.  However family is opting for full aggressive care.  CT chest/abdomen/pelvis: 8/12 1. Dense consolidations at the bilateral lung bases, RIGHT greater than LEFT, consistent with multifocal pneumonia and/or aspiration pneumonitis. 2. Additional patchy ground-glass opacities within the peripheral portions of each lung, most prominent within the upper lobes and LEFT lower lobe.  3. Masslike density within the outer LEFT breast, of uncertain significance, neoplastic mass not excluded. Recommend correlation with diagnostic mammogram at a dedicated breast center after current issues are resolved. 4. Colonic  diverticulosis without evidence of acute diverticulitis. 5. Fluid, and associated air-fluid levels, scattered throughout the large and small bowel, suggesting ileus. No evidence of bowel obstruction. 6. No free fluid or abscess collection is seen within the abdomen or pelvis. No free intraperitoneal air.  Subjective: Patient was seen and examined this morning.  Remains lethargic.  Barely able to open eyes on my command.  Unable to follow any motor or verbal command otherwise. T-max 1.2 in last 24 hours. WBC count is rising up, 20,000 today.  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  -Per MRI on 7/18 with history of recent CVA on June 2021 -Patient was on clopidogrel 75 mg dailyprior to admission.  Aspirin 81 mg daily with in this admission. -Per neurology note from 7/20, patient was kept on DAPT for 3 weeks till 8/10.  Since then, patient is on Plavix only.  -Continue Crestor.  Severe dysphagia -Currently has PEG tube with Glucerna 1.2 at 55 mill per hour.  Aspiration pneumonia -Patient started vomiting after PEG tube placement.  Also low-grade fever, elevated WBC count.   -No growth in blood culture.  Insignificant growth in urine culture -However, patient has intermittent spikes of fever and WBC count is gradually worsening, it is 20,000 today. -CT chest obtained today.  Findings as above showing dense bilateral multifocal aspiration pneumonia. -Patient is not able to generate cough -Currently on IV Zosyn and supplemental oxygen. -Multiple long conversations with patient's son have been done in this one month of hospitalization.  He continues to maintain a full code. -I would ask for palliative care to revisit. -Continue to monitor temperature and  leukocytes.  Acute kidney injury -BUN/creatinine was significantly elevated to 149/1.5 on admission.  -With IV hydration, gradually improved,  creatinine 1.17 today. -Continue to monitor.  -Nephrology signed off.   Hypernatremia/hyponatremia: -Sodium was initially elevated, gradually improved, 144 today.  -Monitor sodium level.  Acute combined toxic and metabolic encephalopathy: -Multifactorial : acute pontine/lacunarinfarct, dehydration, dyslipidemia, hospital induced delirium, -EEGunrevealing -TSH normal-B12 and folic acid not deficient -Continue to monitor mental status.  Acute urinary retention -8/9, required multiple in and out catheterization.  Foley catheter was inserted.  Severe constipation -Likely contributing to vomiting and residuals in PEG tube feeding. -8/8, abdominal x-ray showed rectal fecal impaction. -Patient had a bowel movement after enema.  Continue MiraLAX daily.  Noninsulin-dependent type II DM 2, controlled, A1c 6.9 -Metformin on hold.  Continue Levemir 10 units, every 4 hours sliding scale and Accu-Cheks.  -Metformin hold.    HTN -Home medications include amlodipine 10 mg daily, HCTZ 25 mg daily, metoprolol 25 mg twice daily.   -Currently on metoprolol 25 mg twice daily.  Continue to monitor.  Depression: Patient was on low-dose Remeron but I stopped it yesterday because of altered mentation.  Goals of care:  I called and discussed patient's condition, and most likely natural course with her daughter Mr. Tasha [name].  We had a long conversation about her CODE STATUS as well.  Given her debility, poor quality of life and unlikely return back to her baseline functionality, in my medical opinion, patient would be appropriate for DNR status at least.  Patient's son however states that patient had explicitly told him in the past that she would like to be kept alive 'no matter what'.  He said he would hence leave her full code.  He said he is in process of getting guardianship/power of attorney.   Nutrition Problem: Inadequate oral intake Etiology: lethargy/confusion Mobility: Limited Code  Status:   Code Status: Full Code  Nutritional status: Body mass index is 13.9 kg/m. Nutrition Problem: Inadequate oral intake Etiology: lethargy/confusion Signs/Symptoms: other (comment) (per RN report) Diet Order            Diet NPO time specified  Diet effective midnight                 DVT prophylaxis: heparin injection 5,000 Units Start: 03/31/20 2200   Antimicrobials:  IV Zosyn Fluid: No IV fluid  Consultants: Palliative care Family Communication:  None at bedside  Status is: Inpatient  Remains inpatient appropriate because:Altered mental status and IV treatments appropriate due to intensity of illness or inability to take PO  Dispo:  Patient From:   SNF  Planned Disposition:   Back to SNF  Expected discharge date:   Patient is palliative care/hospice appropriate and I believe for the family will continue to maintain full code.  In full CODE STATUS, she is not yet medically stable to be out of the hospital.  Medically stable for discharge:   No  Infusions:  . feeding supplement (GLUCERNA 1.2 CAL) 1,000 mL (04/08/20 1330)  . piperacillin-tazobactam (ZOSYN)  IV 3.375 g (04/08/20 1328)    Scheduled Meds: . chlorhexidine  15 mL Mouth Rinse BID  . Chlorhexidine Gluconate Cloth  6 each Topical Daily  . clopidogrel  75 mg Per Tube Daily  . heparin injection (subcutaneous)  5,000 Units Subcutaneous Q8H  . insulin aspart  0-9 Units Subcutaneous Q4H  . insulin detemir  10 Units Subcutaneous BID  . mouth rinse  15 mL Mouth Rinse BID  . metoprolol  tartrate  25 mg Per Tube BID  . pantoprazole sodium  40 mg Per Tube Daily  . polyethylene glycol  17 g Per Tube BID  . rosuvastatin  5 mg Per Tube Daily    Antimicrobials: Anti-infectives (From admission, onward)   Start     Dose/Rate Route Frequency Ordered Stop   04/05/20 1800  vancomycin (VANCOREADY) IVPB 750 mg/150 mL  Status:  Discontinued        750 mg 150 mL/hr over 60 Minutes Intravenous Every 24 hours 04/04/20  1727 04/07/20 0805   04/04/20 1730  vancomycin (VANCOREADY) IVPB 1250 mg/250 mL        1,250 mg 166.7 mL/hr over 90 Minutes Intravenous  Once 04/04/20 1727 04/04/20 1948   04/04/20 1730  piperacillin-tazobactam (ZOSYN) IVPB 3.375 g     Discontinue     3.375 g 12.5 mL/hr over 240 Minutes Intravenous Every 8 hours 04/04/20 1727     04/04/20 1715  piperacillin-tazobactam (ZOSYN) IVPB 3.375 g  Status:  Discontinued       Note to Pharmacy: Adjust as per renal function   3.375 g 100 mL/hr over 30 Minutes Intravenous Every 8 hours 04/04/20 1706 04/04/20 1726   03/31/20 1207  vancomycin (VANCOCIN) 1-5 GM/200ML-% IVPB       Note to Pharmacy: Erie Noe   : cabinet override      03/31/20 1207 03/31/20 1232   03/31/20 1200  vancomycin (VANCOCIN) IVPB 1000 mg/200 mL premix        1,000 mg 200 mL/hr over 60 Minutes Intravenous  Once 03/31/20 1155 03/31/20 1509   03/10/20 1200  cefTRIAXone (ROCEPHIN) 2 g in sodium chloride 0.9 % 100 mL IVPB        2 g 200 mL/hr over 30 Minutes Intravenous Every 24 hours 03/10/20 0924 03/14/20 1333   03/09/20 1000  ceFEPIme (MAXIPIME) 1 g in sodium chloride 0.9 % 100 mL IVPB  Status:  Discontinued        1 g 200 mL/hr over 30 Minutes Intravenous Every 24 hours 03/08/20 1219 03/10/20 0924   03/08/20 1219  vancomycin variable dose per unstable renal function (pharmacist dosing)  Status:  Discontinued         Does not apply See admin instructions 03/08/20 1219 03/10/20 0906   03/08/20 1100  vancomycin (VANCOCIN) IVPB 1000 mg/200 mL premix        1,000 mg 200 mL/hr over 60 Minutes Intravenous  Once 03/08/20 1046 03/08/20 1406   03/08/20 1100  ceFEPIme (MAXIPIME) 2 g in sodium chloride 0.9 % 100 mL IVPB        2 g 200 mL/hr over 30 Minutes Intravenous  Once 03/08/20 1046 03/08/20 1211      PRN meds: acetaminophen **OR** [DISCONTINUED] acetaminophen, lip balm, polyethylene glycol, Resource ThickenUp Clear   Objective: Vitals:   04/08/20 0737 04/08/20 1228  BP:  (!) 143/67 (!) 163/76  Pulse: 88 72  Resp: 16 19  Temp: 100.1 F (37.8 C) 98.3 F (36.8 C)  SpO2: 97% 97%    Intake/Output Summary (Last 24 hours) at 04/08/2020 1412 Last data filed at 04/08/2020 1100 Gross per 24 hour  Intake 2568.14 ml  Output 1200 ml  Net 1368.14 ml   Filed Weights   04/06/20 0306 04/07/20 0330 04/08/20 0409  Weight: 59.4 kg 59.5 kg 36.7 kg   Weight change: -22.8 kg Body mass index is 13.9 kg/m.   Physical Exam: General exam: Lethargic.  Barely able to open eyes on verbal command.  Unable to follow any other commands. Skin: No rashes, lesions or ulcers. HEENT: Atraumatic, normocephalic, supple neck, no obvious bleeding Lungs: Shallow breathing effort.  Diminished air entry in both lungs CVS: Regular rate and rhythm, no murmur GI/Abd soft, nontender, nondistended, active bleeding CNS: Lethargic Psychiatry: Depressed look Extremities: No pedal edema, no calf tenderness  Data Review: I have personally reviewed the laboratory data and studies available.  Recent Labs  Lab 04/04/20 0315 04/05/20 0352 04/06/20 0408 04/07/20 0555 04/08/20 0137  WBC 18.2* 18.1* 20.3* 17.4* 20.0*  NEUTROABS 15.3* 15.6* 16.4* 13.8* 15.0*  HGB 10.1* 7.7* 7.2* 7.2* 7.8*  HCT 30.6* 23.0* 22.7* 23.0* 23.3*  MCV 89.0 88.8 89.7 91.3 87.9  PLT 637* 470* 392 404* 396   Recent Labs  Lab 04/04/20 0315 04/05/20 0352 04/05/20 0942 04/06/20 0408 04/07/20 0555 04/08/20 0137  NA 128* 128*  --  137 142 144  K 4.7 4.2  --  3.6 3.8 3.7  CL 93* 94*  --  102 107 108  CO2 20* 19*  --  21* 21* 20*  GLUCOSE 219* 137*  --  115* 176* 166*  BUN 63* 77*  --  65* 50* 41*  CREATININE 1.42* 1.87*  --  1.57* 1.22* 1.17*  CALCIUM 9.7 9.2  --  9.1 8.9 9.1  MG 2.0 2.1  --  2.1  --   --   PHOS 3.7  --  4.5 4.1  --   --    Lab Results  Component Value Date   HGBA1C 6.9 (H) 02/15/2020       Component Value Date/Time   CHOL 223 (H) 02/16/2020 1400   TRIG 157 (H) 02/16/2020 1400   HDL 49  02/16/2020 1400   CHOLHDL 4.6 02/16/2020 1400   VLDL 31 02/16/2020 1400   LDLCALC 143 (H) 02/16/2020 1400    Signed, Lorin Glass, MD [facility] Pager: [phone] (Secure Chat preferred) 04/08/2020

## 2020-04-09 LAB — GLUCOSE, CAPILLARY
Glucose-Capillary: 167 mg/dL — ABNORMAL HIGH (ref 70–99)
Glucose-Capillary: 161 mg/dL — ABNORMAL HIGH (ref 70–99)
Glucose-Capillary: 216 mg/dL — ABNORMAL HIGH (ref 70–99)
Glucose-Capillary: 133 mg/dL — ABNORMAL HIGH (ref 70–99)
Glucose-Capillary: 142 mg/dL — ABNORMAL HIGH (ref 70–99)
Glucose-Capillary: 172 mg/dL — ABNORMAL HIGH (ref 70–99)

## 2020-04-09 LAB — CBC WITH DIFFERENTIAL/PLATELET
Abs Immature Granulocytes: 1.75 10*3/uL — ABNORMAL HIGH (ref 0.00–0.07)
Basophils Absolute: 0.1 10*3/uL (ref 0.0–0.1)
Basophils Relative: 1 %
Eosinophils Absolute: 0.6 10*3/uL — ABNORMAL HIGH (ref 0.0–0.5)
Eosinophils Relative: 3 %
HCT: 24.8 % — ABNORMAL LOW (ref 36.0–46.0)
Hemoglobin: 7.7 g/dL — ABNORMAL LOW (ref 12.0–15.0)
Immature Granulocytes: 8 %
Lymphocytes Relative: 11 %
Lymphs Abs: 2.2 10*3/uL (ref 0.7–4.0)
MCH: 28.8 pg (ref 26.0–34.0)
MCHC: 31 g/dL (ref 30.0–36.0)
MCV: 92.9 fL (ref 80.0–100.0)
Monocytes Absolute: 0.9 10*3/uL (ref 0.1–1.0)
Monocytes Relative: 4 %
Neutro Abs: 15.2 10*3/uL — ABNORMAL HIGH (ref 1.7–7.7)
Neutrophils Relative %: 73 %
Platelets: 411 10*3/uL — ABNORMAL HIGH (ref 150–400)
RBC: 2.67 MIL/uL — ABNORMAL LOW (ref 3.87–5.11)
RDW: 16.2 % — ABNORMAL HIGH (ref 11.5–15.5)
WBC: 20.8 10*3/uL — ABNORMAL HIGH (ref 4.0–10.5)
nRBC: 0 % (ref 0.0–0.2)

## 2020-04-09 LAB — BASIC METABOLIC PANEL
Anion gap: 11 (ref 5–15)
BUN: 39 mg/dL — ABNORMAL HIGH (ref 8–23)
CO2: 24 mmol/L (ref 22–32)
Calcium: 9.2 mg/dL (ref 8.9–10.3)
Chloride: 111 mmol/L (ref 98–111)
Creatinine, Ser: 0.99 mg/dL (ref 0.44–1.00)
GFR calc Af Amer: >60 mL/min (ref >60)
GFR calc non Af Amer: 57 mL/min — ABNORMAL LOW (ref >60)
Glucose, Bld: 178 mg/dL — ABNORMAL HIGH (ref 70–99)
Potassium: 3.7 mmol/L (ref 3.5–5.1)
Sodium: 146 mmol/L — ABNORMAL HIGH (ref 135–145)

## 2020-04-09 LAB — CULTURE, BLOOD (ROUTINE X 2)
Culture: NO GROWTH
Culture: NO GROWTH
Special Requests: ADEQUATE

## 2020-04-09 LAB — PHOSPHORUS: Phosphorus: 3.5 mg/dL (ref 2.5–4.6)

## 2020-04-09 NOTE — Progress Notes (Signed)
PROGRESS NOTE  Tasha [name]  DOB: Sep 27, 1945  PCP: Patient, No Pcp Per [facility]:[id number]  DOA: 03/08/2020  LOS: 32 days   Chief Complaint  Patient presents with  . Emesis   Brief narrative: Patient is a 74 year old with a history of stroke, DM2, HTN, and MI who was discharged June24,2021 following a stroke work-up.   Patient presented back to the hospital on 03/08/2020 with 2-day his of intractable nausea, vomiting along and altered mental status.  On presentation, patient was found to be have lactic acidosis and acute kidney injury. Patient improved with supportive care. Due to persistent right-sided weakness that was felt to be worse than previously documented, an MRI was obtained on 03/14/20.   MRI brain revealed acute left pontine infarct and acute small infarct on the posterior body of the right lateral ventricle.  Neurology consultation obtained.  Work-up done. Patient was noted to have severe dysphagia, she was started on tube feeding.  She is still on tube feeding for several days in the hospital.  Finally she underwent PEG tube placement by IR on 8/4.  She was started on PEG tube feeding however she has had episodes of vomiting after that.  PEG tube feeding adjusted.  She also started on work-up for possibility of aspiration pneumonia. Palliative consultation appreciated.  However family is opting for full aggressive care.  CT chest/abdomen/pelvis: 8/12 1. Dense consolidations at the bilateral lung bases, RIGHT greater than LEFT, consistent with multifocal pneumonia and/or aspiration pneumonitis. 2. Additional patchy ground-glass opacities within the peripheral portions of each lung, most prominent within the upper lobes and LEFT lower lobe.  3. Masslike density within the outer LEFT breast, of uncertain significance, neoplastic mass not excluded. Recommend correlation with diagnostic mammogram at a dedicated breast center after current issues are resolved. 4. Colonic  diverticulosis without evidence of acute diverticulitis. 5. Fluid, and associated air-fluid levels, scattered throughout the large and small bowel, suggesting ileus. No evidence of bowel obstruction. 6. No free fluid or abscess collection is seen within the abdomen or pelvis. No free intraperitoneal air.  Subjective: Patient was seen and examined this afternoon. She was out in chair.  Barely tries to open eyes on my, and sternal rub. No fever in last 24 hours.  Heart rate between 70s and 90s.  Blood pressure 140s to 160s. 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  -Per MRI on 7/18 with history of recent CVA on June 2021 -Patient was on clopidogrel 75 mg dailyprior to admission.  Aspirin 81 mg daily with in this admission. -Per neurology note from 7/20, patient was kept on DAPT for 3 weeks till 8/10.  Since then, patient is on Plavix only.  -Continue Crestor.  Severe bilateral aspiration pneumonia Severe dysphagia -Patient started vomiting after PEG tube placement.  Also low-grade fever, elevated WBC count.   -No growth in blood culture.  Insignificant growth in urine culture -However, patient has intermittent spikes of fever and WBC count is gradually worsening, it is 20,000 today. -CT chest obtained on 8/12.  Findings as above showing dense bilateral multifocal aspiration pneumonia. -Patient is not able to generate cough -Currently on IV Zosyn and supplemental oxygen. -Multiple long conversations with patient's son have been done in this one month of hospitalization.  He continues to maintain a full code. -Currently has PEG tube with Glucerna 1.2 at 55 mill per hour.  Acute kidney injury -BUN/creatinine was significantly elevated to 149/1.5 on admission.  -With IV hydration, gradually improved, creatinine 0.99 today -Continue to  monitor.  -Nephrology signed off.   Hypernatremia Sodium level is trending up again, 146 today. -Monitor sodium level.  Recent Labs  Lab 04/04/20 0315 04/05/20 0352 04/06/20 0408 04/07/20 0555 04/08/20 0137 04/09/20 0819  NA 128* 128* 137 142 144 146*   Acute combined toxic and metabolic encephalopathy: -Multifactorial : acute pontine/lacunarinfarct, dehydration, dyslipidemia, hospital induced delirium, -EEGunrevealing -TSH normal-B12 and folic acid not deficient -Mental status has remained consistently poor for last several days and does not show any signs of improvement today..  Acute urinary retention -8/9, required multiple in and out catheterization.  Foley catheter was inserted.  Severe constipation -Likely contributing to vomiting and residuals in PEG tube feeding. -8/8, abdominal x-ray showed rectal fecal impaction. -Patient had a bowel movement after enema.  Continue MiraLAX daily.  Noninsulin-dependent type II DM 2, controlled, A1c 6.9 -Metformin on hold.  Blood sugar inconsistent.  However she has no hypoglycemic episode.  -Continue Levemir 10 units, every 4 hours sliding scale and Accu-Cheks.   HTN -Home medications include amlodipine 10 mg daily, HCTZ 25 mg daily, metoprolol 25 mg twice daily.   -Currently on metoprolol 25 mg twice daily.  Continue to monitor.  Depression: Patient was on low-dose Remeron but I stopped it yesterday because of altered mentation.  Goals of care:  I have personally called and discussed patient's condition, and most likely natural course with her son Mr. Tasha [name].  We had a long conversation about her CODE STATUS as well.  Given her debility, poor quality of life and unlikely return back to her baseline functionality, in my medical opinion, patient would be appropriate for DNR status at least.  Patient's son however states that patient had explicitly told him in the past that she would like to be kept alive 'no matter  what'.  He said he would hence leave her full code.  He said he is in process of getting guardianship/power of attorney. 8/13 I called patient's son again this afternoon.  I stated that patient does not look good at all and has very high risk of deteriorating further from here.   Nutrition Problem: Inadequate oral intake Etiology: lethargy/confusion Mobility: Limited Code Status:   Code Status: Full Code  Nutritional status: Body mass index is 13.9 kg/m. Nutrition Problem: Inadequate oral intake Etiology: lethargy/confusion Signs/Symptoms: other (comment) (per RN report) Diet Order            Diet NPO time specified  Diet effective midnight                 DVT prophylaxis: heparin injection 5,000 Units Start: 03/31/20 2200   Antimicrobials:  IV Zosyn Fluid: No IV fluid  Consultants: Palliative care Family Communication:  None at bedside  Status is: Inpatient  Remains inpatient appropriate because:Altered mental status and IV treatments appropriate due to intensity of illness or inability to take PO  Dispo:  Patient From:   SNF  Planned Disposition:   Back to SNF  Expected discharge date:   Patient is palliative care/hospice appropriate and I believe for the family will continue to maintain full code.  In full CODE STATUS, she is not yet medically stable to be out of the hospital.  Medically stable for discharge:   No  Infusions:  . feeding supplement (GLUCERNA 1.2 CAL) 55 mL/hr at 04/09/20 1000  . piperacillin-tazobactam (ZOSYN)  IV 3.375  g (04/09/20 1214)    Scheduled Meds: . chlorhexidine  15 mL Mouth Rinse BID  . Chlorhexidine Gluconate Cloth  6 each Topical Daily  . clopidogrel  75 mg Per Tube Daily  . heparin injection (subcutaneous)  5,000 Units Subcutaneous Q8H  . insulin aspart  0-9 Units Subcutaneous Q4H  . insulin detemir  10 Units Subcutaneous BID  . mouth rinse  15 mL Mouth Rinse BID  . metoprolol tartrate  25 mg Per Tube BID  . pantoprazole sodium   40 mg Per Tube Daily  . polyethylene glycol  17 g Per Tube BID  . rosuvastatin  5 mg Per Tube Daily    Antimicrobials: Anti-infectives (From admission, onward)   Start     Dose/Rate Route Frequency Ordered Stop   04/05/20 1800  vancomycin (VANCOREADY) IVPB 750 mg/150 mL  Status:  Discontinued        750 mg 150 mL/hr over 60 Minutes Intravenous Every 24 hours 04/04/20 1727 04/07/20 0805   04/04/20 1730  vancomycin (VANCOREADY) IVPB 1250 mg/250 mL        1,250 mg 166.7 mL/hr over 90 Minutes Intravenous  Once 04/04/20 1727 04/04/20 1948   04/04/20 1730  piperacillin-tazobactam (ZOSYN) IVPB 3.375 g     Discontinue     3.375 g 12.5 mL/hr over 240 Minutes Intravenous Every 8 hours 04/04/20 1727     04/04/20 1715  piperacillin-tazobactam (ZOSYN) IVPB 3.375 g  Status:  Discontinued       Note to Pharmacy: Adjust as per renal function   3.375 g 100 mL/hr over 30 Minutes Intravenous Every 8 hours 04/04/20 1706 04/04/20 1726   03/31/20 1207  vancomycin (VANCOCIN) 1-5 GM/200ML-% IVPB       Note to Pharmacy: [name], [name]   : cabinet override      03/31/20 1207 03/31/20 1232   03/31/20 1200  vancomycin (VANCOCIN) IVPB 1000 mg/200 mL premix        1,000 mg 200 mL/hr over 60 Minutes Intravenous  Once 03/31/20 1155 03/31/20 1509   03/10/20 1200  cefTRIAXone (ROCEPHIN) 2 g in sodium chloride 0.9 % 100 mL IVPB        2 g 200 mL/hr over 30 Minutes Intravenous Every 24 hours 03/10/20 0924 03/14/20 1333   03/09/20 1000  ceFEPIme (MAXIPIME) 1 g in sodium chloride 0.9 % 100 mL IVPB  Status:  Discontinued        1 g 200 mL/hr over 30 Minutes Intravenous Every 24 hours 03/08/20 1219 03/10/20 0924   03/08/20 1219  vancomycin variable dose per unstable renal function (pharmacist dosing)  Status:  Discontinued         Does not apply See admin instructions 03/08/20 1219 03/10/20 0906   03/08/20 1100  vancomycin (VANCOCIN) IVPB 1000 mg/200 mL premix        1,000 mg 200 mL/hr over 60 Minutes Intravenous  Once  03/08/20 1046 03/08/20 1406   03/08/20 1100  ceFEPIme (MAXIPIME) 2 g in sodium chloride 0.9 % 100 mL IVPB        2 g 200 mL/hr over 30 Minutes Intravenous  Once 03/08/20 1046 03/08/20 1211      PRN meds: acetaminophen **OR** [DISCONTINUED] acetaminophen, lip balm, polyethylene glycol, Resource ThickenUp Clear   Objective: Vitals:   04/09/20 1159 04/09/20 1543  BP: (!) 145/71 (!) 141/68  Pulse: 65 72  Resp: 18 18  Temp: 97.9 F (36.6 C) 97.9 F (36.6 C)  SpO2: 100% 98%    Intake/Output Summary (  Last 24 hours) at 04/09/2020 1716 Last data filed at 04/09/2020 1000 Gross per 24 hour  Intake 738.81 ml  Output 1150 ml  Net -411.19 ml   Filed Weights   04/07/20 0330 04/08/20 0409 04/09/20 0343  Weight: 59.5 kg 36.7 kg 36.7 kg   Weight change: -0 kg Body mass index is 13.9 kg/m.   Physical Exam: General exam: Lethargic.  Barely able to open eyes on verbal command.  Unable to follow any other commands. Skin: No rashes, lesions or ulcers. HEENT: Atraumatic, normocephalic, supple neck, no obvious bleeding Lungs: Shallow breathing effort.  Diminished air entry in both lungs CVS: Regular rate and rhythm, no murmur GI/Abd soft, nontender, nondistended, active bleeding CNS: Lethargic, barely able to open eyes Psychiatry: Depressed look Extremities: No pedal edema, no calf tenderness  Data Review: I have personally reviewed the laboratory data and studies available.  Recent Labs  Lab 04/05/20 0352 04/06/20 0408 04/07/20 0555 04/08/20 0137 04/09/20 0819  WBC 18.1* 20.3* 17.4* 20.0* 20.8*  NEUTROABS 15.6* 16.4* 13.8* 15.0* 15.2*  HGB 7.7* 7.2* 7.2* 7.8* 7.7*  HCT 23.0* 22.7* 23.0* 23.3* 24.8*  MCV 88.8 89.7 91.3 87.9 92.9  PLT 470* 392 404* 396 411*   Recent Labs  Lab 04/04/20 0315 04/04/20 0315 04/05/20 0352 04/05/20 0942 04/06/20 0408 04/07/20 0555 04/08/20 0137 04/09/20 0819  NA 128*   < > 128*  --  137 142 144 146*  K 4.7   < > 4.2  --  3.6 3.8 3.7 3.7  CL  93*   < > 94*  --  102 107 108 111  CO2 20*   < > 19*  --  21* 21* 20* 24  GLUCOSE 219*   < > 137*  --  115* 176* 166* 178*  BUN 63*   < > 77*  --  65* 50* 41* 39*  CREATININE 1.42*   < > 1.87*  --  1.57* 1.22* 1.17* 0.99  CALCIUM 9.7   < > 9.2  --  9.1 8.9 9.1 9.2  MG 2.0  --  2.1  --  2.1  --   --   --   PHOS 3.7  --   --  4.5 4.1  --   --  3.5   < > = values in this interval not displayed.   Lab Results  Component Value Date   HGBA1C 6.9 (H) 02/15/2020       Component Value Date/Time   CHOL 223 (H) 02/16/2020 1400   TRIG 157 (H) 02/16/2020 1400   HDL 49 02/16/2020 1400   CHOLHDL 4.6 02/16/2020 1400   VLDL 31 02/16/2020 1400   LDLCALC 143 (H) 02/16/2020 1400    Signed, Lorin Glass, MD [facility] Pager: [phone] (Secure Chat preferred) 04/09/2020

## 2020-04-09 NOTE — Plan of Care (Signed)

## 2020-04-09 NOTE — Progress Notes (Signed)
Occupational Therapy Treatment Patient Details Name: Tasha [name] MRN: [id number] DOB: Feb 03, 1946 Today's Date: 04/09/2020    History of present illness Shinita Mac is a 74 y.o. 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 In hospital, R sided weakness not improving, MRI showed new acute infarcts of L pontine and post body R lateral ventricle. Pt had Gtube placed on 04/01/2020   OT comments  Patient seated in recliner.  Engaged in [place], as well as stretch of L UE due to increased tone. Able to visually follow therapist to L and R and initially open eyes to voice, but declines with fatigue.  Patient requires total assist [facility] for washing face and hands.  Met 1/4 goals, updated goals today.    Follow Up Recommendations  SNF    Equipment Recommendations  None recommended by OT    Recommendations for Other Services      Precautions / Restrictions Precautions Precautions: Fall Precaution Comments: right hemiplegia, PEG Required Braces or Orthoses: Splint/Cast Splint/Cast: bil resting hand splints- night wear only       Mobility Bed Mobility                  Transfers                      Balance Overall balance assessment: Needs assistance     Sitting balance - Comments: total A for positioning in recliner due to R lateral lean with R head turn and no trunk activation                                   ADL either performed or assessed with clinical judgement   ADL Overall ADL's : Needs assistance/impaired     Grooming: Wash/dry hands;Wash/dry face;Sitting;Total assistance Grooming Details (indicate cue type and reason): total assist given hand over hand support to wash face and hands                                General ADL Comments: pt up in chair upon arrival, total assist to engage in self care  tasks      Vision   Additional Comments: patient able to scan and follow therapist initally, fades with fatigue    Perception     Praxis      Cognition Arousal/Alertness: Lethargic Behavior During Therapy: Flat affect Overall Cognitive Status: Difficult to assess                                 General Comments: not following commands, but able to locate therapist with her eyes initally (as fatigues this fades)        Exercises General Exercises - Upper Extremity Shoulder Flexion: PROM;Supine;Both;10 reps Shoulder Extension: PROM;Both;Supine;10 reps Elbow Flexion: PROM;Both;Supine;10 reps Elbow Extension: PROM;Both;Supine;10 reps Wrist Flexion: PROM;Both;Supine;10 reps Wrist Extension: PROM;Both;Supine;10 reps Digit Composite Flexion: PROM;Both;Supine;10 reps Composite Extension: PROM;Both;Supine;10 reps Other Exercises Other Exercises: PROM to bilat UE/LE in supine and seated in recliner Other Exercises: cervical PROM rotation   Shoulder Instructions       General Comments      Pertinent Vitals/ Pain       Pain Assessment:  Faces Pain Score: 0-No pain  Home Living                                          Prior Functioning/Environment              Frequency  Min 2X/week        Progress Toward Goals  OT Goals(current goals can now be found in the care plan section)  Progress towards OT goals: Not progressing toward goals - comment (not following commands )  Acute Rehab OT Goals Patient Stated Goal: Unable to state OT Goal Formulation: Patient unable to participate in goal setting ADL Goals Pt Will Perform Grooming: with max assist;bed level Additional ADL Goal #1: Pt will complete rolling with max assist +2 as precursor to ADLs. Additional ADL Goal #2: Patient will acurately respond to yes/no questions 90% of the time to increase participation in ADLs. Additional ADL Goal #3: Pt will demonstrate ability to follow 1  step commands with max multimodal cueing with 25% accuracy.  Plan Discharge plan remains appropriate;Frequency remains appropriate    Co-evaluation                 AM-PAC OT "6 Clicks" Daily Activity     Outcome Measure   Help from another person eating meals?: Total Help from another person taking care of personal grooming?: Total Help from another person toileting, which includes using toliet, bedpan, or urinal?: Total Help from another person bathing (including washing, rinsing, drying)?: Total Help from another person to put on and taking off regular upper body clothing?: Total Help from another person to put on and taking off regular lower body clothing?: Total 6 Click Score: 6    End of Session    OT Visit Diagnosis: Unsteadiness on feet (R26.81);History of falling (Z91.81);Muscle weakness (generalized) (M62.81);Other symptoms and signs involving the nervous system (R29.898);Other symptoms and signs involving cognitive function;Cognitive communication deficit (R41.841);Other abnormalities of gait and mobility (R26.89)   Activity Tolerance Patient limited by lethargy   Patient Left in chair;with call bell/phone within reach   Nurse Communication Mobility status        Time: 1030-1047 OT Time Calculation (min): 17 min  Charges: OT General Charges $OT Visit: 1 Visit OT Treatments $Self Care/Home Management : 8-22 mins  Jolaine Artist, OT [place] Pager [phone] Office [phone]    Delight Stare 04/09/2020, 2:35 PM

## 2020-04-09 NOTE — Progress Notes (Signed)
Physical Therapy Treatment Patient Details Name: Tasha [name] MRN: [id number] DOB: [birth year] Today's Date: 04/09/2020    History of Present Illness Tasha [name] is a 74 y.o. 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 In hospital, R sided weakness not improving, MRI showed new acute infarcts of L pontine and post body R lateral ventricle. Pt had Gtube placed on 04/01/2020    PT Comments    Patient progressing to OOB up in chair achieving one LTG, though still needing total A for all mobility, positioning and ROM activities.  Other goals reassessed and downgraded today.  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  She will need SNF rehab at d/c.    Follow Up Recommendations  SNF     Equipment Recommendations  None recommended by PT    Recommendations for Other Services       Precautions / Restrictions Precautions Precautions: Fall Precaution Comments: right hemiplegia, PEG Splint/Cast: bil resting hand splints- night wear only    Mobility  Bed Mobility Overal bed mobility: Needs Assistance Bed Mobility: Rolling Rolling: +2 for physical assistance;+2 for safety/equipment;Total assist         General bed mobility comments: rolling for hygiene as soiled in bed and to place lift pad  Transfers Overall transfer level: Needs assistance               General transfer comment: bed to chair via maximove +2 total A  Ambulation/Gait                 Stairs             Wheelchair Mobility    Modified Rankin (Stroke Patients Only) Modified Rankin (Stroke Patients Only) Pre-Morbid Rankin Score: Severe disability Modified Rankin: Severe disability     Balance Overall balance assessment: Needs assistance     Sitting balance - Comments: total A for positioning in recliner due to R lateral lean with R head turn and no trunk  activation                                    Cognition Arousal/Alertness: Awake/alert Behavior During Therapy: Flat affect Overall Cognitive Status: Difficult to assess Area of Impairment: Attention;Memory;Following commands;Awareness                   Current Attention Level: Focused Memory: Decreased short-term memory Following Commands: Follows one step commands inconsistently     Problem Solving: Slow processing;Decreased initiation;Difficulty sequencing;Requires verbal cues;Requires tactile cues General Comments: not following commands except to locate PT with her eyes      Exercises Other Exercises Other Exercises: PROM to bilat UE/LE in supine and seated in recliner Other Exercises: cervical PROM rotation    General Comments        Pertinent Vitals/Pain Pain Assessment: Faces Faces Pain Scale: Hurts little more Pain Location: grimacing with cervical PROM Pain Descriptors / Indicators: Grimacing Pain Intervention(s): Monitored during session;Repositioned;Limited activity within patient's tolerance    Home Living                      Prior Function            PT Goals (current goals can now be found in the care plan section) Progress towards PT goals: Progressing toward goals  Frequency    Min 2X/week      PT Plan Current plan remains appropriate    Co-evaluation              AM-PAC PT "6 Clicks" Mobility   Outcome Measure  Help needed turning from your back to your side while in a flat bed without using bedrails?: Total Help needed moving from lying on your back to sitting on the side of a flat bed without using bedrails?: Total Help needed moving to and from a bed to a chair (including a wheelchair)?: Total Help needed standing up from a chair using your arms (e.g., wheelchair or bedside chair)?: Total Help needed to walk in hospital room?: Total Help needed climbing 3-5 steps with a railing? : Total 6  Click Score: 6    End of Session   Activity Tolerance: Patient tolerated treatment well Patient left: in chair;with chair alarm set;with nursing/sitter in room Nurse Communication: Mobility status;Need for lift equipment PT Visit Diagnosis: Other abnormalities of gait and mobility (R26.89);Muscle weakness (generalized) (M62.81);Hemiplegia and hemiparesis Hemiplegia - Right/Left: Right Hemiplegia - dominant/non-dominant: Dominant Hemiplegia - caused by: Cerebral infarction     Time: 0935-1010 PT Time Calculation (min) (ACUTE ONLY): 35 min  Charges:  $Therapeutic Activity: 23-37 mins                     Sheran Lawless, PT Acute Rehabilitation Services Pager:[phone] Office:[phone] 04/09/2020    Tasha [name] 04/09/2020, 10:39 AM

## 2020-04-09 NOTE — Plan of Care (Signed)
  Problem: Education: Goal: Knowledge of General Education information will improve Description Including pain rating scale, medication(s)/side effects and non-pharmacologic comfort measures Outcome: Progressing   

## 2020-04-10 LAB — BASIC METABOLIC PANEL
Anion gap: 11 (ref 5–15)
BUN: 38 mg/dL — ABNORMAL HIGH (ref 8–23)
CO2: 26 mmol/L (ref 22–32)
Calcium: 9.3 mg/dL (ref 8.9–10.3)
Chloride: 112 mmol/L — ABNORMAL HIGH (ref 98–111)
Creatinine, Ser: 1.02 mg/dL — ABNORMAL HIGH (ref 0.44–1.00)
GFR calc Af Amer: >60 mL/min (ref >60)
GFR calc non Af Amer: 55 mL/min — ABNORMAL LOW (ref >60)
Glucose, Bld: 181 mg/dL — ABNORMAL HIGH (ref 70–99)
Potassium: 3.7 mmol/L (ref 3.5–5.1)
Sodium: 149 mmol/L — ABNORMAL HIGH (ref 135–145)

## 2020-04-10 LAB — GLUCOSE, CAPILLARY
Glucose-Capillary: 144 mg/dL — ABNORMAL HIGH (ref 70–99)
Glucose-Capillary: 144 mg/dL — ABNORMAL HIGH (ref 70–99)
Glucose-Capillary: 161 mg/dL — ABNORMAL HIGH (ref 70–99)
Glucose-Capillary: 166 mg/dL — ABNORMAL HIGH (ref 70–99)
Glucose-Capillary: 185 mg/dL — ABNORMAL HIGH (ref 70–99)
Glucose-Capillary: 191 mg/dL — ABNORMAL HIGH (ref 70–99)

## 2020-04-10 LAB — CBC WITH DIFFERENTIAL/PLATELET
Abs Immature Granulocytes: 1.88 10*3/uL — ABNORMAL HIGH (ref 0.00–0.07)
Basophils Absolute: 0.1 10*3/uL (ref 0.0–0.1)
Basophils Relative: 1 %
Eosinophils Absolute: 0.5 10*3/uL (ref 0.0–0.5)
Eosinophils Relative: 2 %
HCT: 24.4 % — ABNORMAL LOW (ref 36.0–46.0)
Hemoglobin: 7.5 g/dL — ABNORMAL LOW (ref 12.0–15.0)
Immature Granulocytes: 10 %
Lymphocytes Relative: 13 %
Lymphs Abs: 2.4 10*3/uL (ref 0.7–4.0)
MCH: 29.1 pg (ref 26.0–34.0)
MCHC: 30.7 g/dL (ref 30.0–36.0)
MCV: 94.6 fL (ref 80.0–100.0)
Monocytes Absolute: 0.9 10*3/uL (ref 0.1–1.0)
Monocytes Relative: 5 %
Neutro Abs: 12.7 10*3/uL — ABNORMAL HIGH (ref 1.7–7.7)
Neutrophils Relative %: 69 %
Platelets: 387 10*3/uL (ref 150–400)
RBC: 2.58 MIL/uL — ABNORMAL LOW (ref 3.87–5.11)
RDW: 16.3 % — ABNORMAL HIGH (ref 11.5–15.5)
WBC: 18.4 10*3/uL — ABNORMAL HIGH (ref 4.0–10.5)
nRBC: 0.2 % (ref 0.0–0.2)

## 2020-04-10 MED ORDER — FREE WATER: 125.0000 mL | Status: DC

## 2020-04-10 MED ORDER — GLUCERNA 1.2 CAL PO LIQD
1000.0000 mL | ORAL | Status: DC
  Administered 2020-04-10: 1000 mL
  Filled 2020-04-10 (×3): qty 1000

## 2020-04-10 NOTE — Progress Notes (Signed)
PROGRESS NOTE  Tasha [name]  DOB: 12/08/1945  PCP: Patient, No Pcp Per [facility]:[id number]  DOA: 03/08/2020  LOS: 33 days   Chief Complaint  Patient presents with  . Emesis   Brief narrative: Patient is a 74 year old with a history of stroke, DM2, HTN, and MI who was discharged June24,2021 following a stroke work-up.   Patient presented back to the hospital on 03/08/2020 with 2-day his of intractable nausea, vomiting along and altered mental status.  On presentation, patient was found to be have lactic acidosis and acute kidney injury. Patient improved with supportive care. Due to persistent right-sided weakness that was felt to be worse than previously documented, an MRI was obtained on 03/14/20.   MRI brain revealed acute left pontine infarct and acute small infarct on the posterior body of the right lateral ventricle.  Neurology consultation obtained.  Work-up done. Patient was noted to have severe dysphagia, she was started on tube feeding.  She is still on tube feeding for several days in the hospital.  Finally she underwent PEG tube placement by IR on 8/4.  She was started on PEG tube feeding however she has had episodes of vomiting after that.  PEG tube feeding adjusted.  She also started on work-up for possibility of aspiration pneumonia. Palliative consultation appreciated.  However family is opting for full aggressive care.  CT chest/abdomen/pelvis: 8/12 1. Dense consolidations at the bilateral lung bases, RIGHT greater than LEFT, consistent with multifocal pneumonia and/or aspiration pneumonitis. 2. Additional patchy ground-glass opacities within the peripheral portions of each lung, most prominent within the upper lobes and LEFT lower lobe.  3. Masslike density within the outer LEFT breast, of uncertain significance, neoplastic mass not excluded. Recommend correlation with diagnostic mammogram at a dedicated breast center after current issues are resolved. 4. Colonic  diverticulosis without evidence of acute diverticulitis. 5. Fluid, and associated air-fluid levels, scattered throughout the large and small bowel, suggesting ileus. No evidence of bowel obstruction. 6. No free fluid or abscess collection is seen within the abdomen or pelvis. No free intraperitoneal air.  Subjective: Patient was seen and examined this afternoon. Lying down in bed.  Not in distress.  Eyes remain closed.  Tries to open on verbal command. No other response. Labs this morning with sodium level further worsening at 149.  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  -Per MRI on 7/18 with history of recent CVA on June 2021 -Patient was on clopidogrel 75 mg dailyprior to admission.  Aspirin 81 mg daily with in this admission. -Per neurology note from 7/20, patient was kept on DAPT for 3 weeks till 8/10.  Since then, patient is on Plavix only.  -Continue Crestor.  Acute combined toxic and metabolic encephalopathy: -Multifactorial : acute pontine/lacunarinfarct, dehydration, dyslipidemia, hospital induced delirium, -EEGunrevealing -TSH normal-B12 and folic acid not deficient -Mental status has remained consistently poor for last several days and does not show any signs of improvement today..  Severe bilateral aspiration pneumonia Severe dysphagia -Patient started vomiting after PEG tube placement.  Also low-grade fever, elevated WBC count.   -CT chest obtained on 8/12.  Findings as above showing dense bilateral multifocal aspiration pneumonia. -Patient is not able to generate cough -Currently on IV Zosyn and supplemental oxygen. -Multiple long conversations with patient's son have been done in this one month of hospitalization.  He continues to maintain a full code. -Currently has PEG tube with Glucerna 1.2.  Acute kidney injury -  BUN/creatinine was significantly elevated to 149/1.5 on  admission.  -With IV hydration, gradually improved, creatinine 1.12 -Continue to monitor.  -Nephrology signed off.   Acute hypernatremia Sodium level worsening 149 today.  No oral intake at all. -Patient getting PEG tube feeding.  She is also getting 30 mL every 4 hours of free water.  I will increase it to 60 mL every 4 hours.  Recent Labs  Lab 04/04/20 0315 04/05/20 0352 04/06/20 0408 04/07/20 0555 04/08/20 0137 04/09/20 0819 04/10/20 0251  NA 128* 128* 137 142 144 146* 149*   Acute urinary retention -8/9, required multiple in and out catheterization.  Foley catheter was inserted.  Severe constipation -Likely contributing to vomiting and residuals in PEG tube feeding. -8/8, abdominal x-ray showed rectal fecal impaction. -Patient had a bowel movement after enema. Continue MiraLAX daily.  Noninsulin-dependent type II DM 2, controlled, A1c 6.9 -Metformin on hold.  Blood sugar inconsistent.  However she has no hypoglycemic episode.  -Continue Levemir 10 units, every 4 hours sliding scale and Accu-Cheks.   HTN -Home medications include amlodipine 10 mg daily, HCTZ 25 mg daily, metoprolol 25 mg twice daily.   -Currently on metoprolol 25 mg twice daily.  Continue to monitor.  Depression: Patient was on low-dose Remeron but I stopped it yesterday because of altered mentation.  Goals of care:  I have personally called and discussed patient's condition, and most likely natural course with her son Mr. Sherlene Rickel.  We had a long conversation about her CODE STATUS as well.  Given her debility, poor quality of life and unlikely return back to her baseline functionality, in my medical opinion, patient would be appropriate for DNR status at least.  Patient's son however states that patient had explicitly told him in the past that she would like to be kept alive 'no matter what'.  He said he would hence leave her full code.  He said he is in process of getting guardianship/power of  attorney. 8/13 I called patient's son again.  I stated that patient does not look good at all and has very high risk of deteriorating further from here.  I asked the son to come to the hospital and visit his mom by himself to get a clear picture of her condition. 8/14, per RN, patient's son came in and stated for very short duration and left with instructions to the secretary, to keep her full code.  I had offered him a conversation when he comes to the hospital but apparently he did not wait for it.  Nutrition Problem: Inadequate oral intake Etiology: lethargy/confusion Mobility: Limited Code Status:   Code Status: Full Code  Nutritional status: Body mass index is 21.8 kg/m. Nutrition Problem: Inadequate oral intake Etiology: lethargy/confusion Signs/Symptoms: other (comment) (per RN report) Diet Order            Diet NPO time specified  Diet effective midnight                 DVT prophylaxis: heparin injection 5,000 Units Start: 03/31/20 2200   Antimicrobials:  IV Zosyn Fluid: No IV fluid  Consultants: Palliative care Family Communication:  None at bedside  Status is: Inpatient  Remains inpatient appropriate because:Altered mental status and IV treatments appropriate due to intensity of illness or inability to take PO  Dispo:  Patient From:   SNF  Planned Disposition:   Back to SNF  Expected discharge date:   Patient is palliative care/hospice appropriate and I believe for the family  will continue to maintain full code.  In full CODE STATUS, she is not yet medically stable to be out of the hospital.  Medically stable for discharge:   No  Infusions:  . feeding supplement (GLUCERNA 1.2 CAL) 1,000 mL (04/10/20 1028)  . piperacillin-tazobactam (ZOSYN)  IV 3.375 g (04/10/20 1200)    Scheduled Meds: . chlorhexidine  15 mL Mouth Rinse BID  . Chlorhexidine Gluconate Cloth  6 each Topical Daily  . clopidogrel  75 mg Per Tube Daily  . heparin injection (subcutaneous)  5,000  Units Subcutaneous Q8H  . insulin aspart  0-9 Units Subcutaneous Q4H  . insulin detemir  10 Units Subcutaneous BID  . mouth rinse  15 mL Mouth Rinse BID  . metoprolol tartrate  25 mg Per Tube BID  . pantoprazole sodium  40 mg Per Tube Daily  . polyethylene glycol  17 g Per Tube BID  . rosuvastatin  5 mg Per Tube Daily    Antimicrobials: Anti-infectives (From admission, onward)   Start     Dose/Rate Route Frequency Ordered Stop   04/05/20 1800  vancomycin (VANCOREADY) IVPB 750 mg/150 mL  Status:  Discontinued        750 mg 150 mL/hr over 60 Minutes Intravenous Every 24 hours 04/04/20 1727 04/07/20 0805   04/04/20 1730  vancomycin (VANCOREADY) IVPB 1250 mg/250 mL        1,250 mg 166.7 mL/hr over 90 Minutes Intravenous  Once 04/04/20 1727 04/04/20 1948   04/04/20 1730  piperacillin-tazobactam (ZOSYN) IVPB 3.375 g     Discontinue     3.375 g 12.5 mL/hr over 240 Minutes Intravenous Every 8 hours 04/04/20 1727     04/04/20 1715  piperacillin-tazobactam (ZOSYN) IVPB 3.375 g  Status:  Discontinued       Note to Pharmacy: Adjust as per renal function   3.375 g 100 mL/hr over 30 Minutes Intravenous Every 8 hours 04/04/20 1706 04/04/20 1726   03/31/20 1207  vancomycin (VANCOCIN) 1-5 GM/200ML-% IVPB       Note to Pharmacy: [name], [name]   : cabinet override      03/31/20 1207 03/31/20 1232   03/31/20 1200  vancomycin (VANCOCIN) IVPB 1000 mg/200 mL premix        1,000 mg 200 mL/hr over 60 Minutes Intravenous  Once 03/31/20 1155 03/31/20 1509   03/10/20 1200  cefTRIAXone (ROCEPHIN) 2 g in sodium chloride 0.9 % 100 mL IVPB        2 g 200 mL/hr over 30 Minutes Intravenous Every 24 hours 03/10/20 0924 03/14/20 1333   03/09/20 1000  ceFEPIme (MAXIPIME) 1 g in sodium chloride 0.9 % 100 mL IVPB  Status:  Discontinued        1 g 200 mL/hr over 30 Minutes Intravenous Every 24 hours 03/08/20 1219 03/10/20 0924   03/08/20 1219  vancomycin variable dose per unstable renal function (pharmacist dosing)   Status:  Discontinued         Does not apply See admin instructions 03/08/20 1219 03/10/20 0906   03/08/20 1100  vancomycin (VANCOCIN) IVPB 1000 mg/200 mL premix        1,000 mg 200 mL/hr over 60 Minutes Intravenous  Once 03/08/20 1046 03/08/20 1406   03/08/20 1100  ceFEPIme (MAXIPIME) 2 g in sodium chloride 0.9 % 100 mL IVPB        2 g 200 mL/hr over 30 Minutes Intravenous  Once 03/08/20 1046 03/08/20 1211      PRN meds: acetaminophen **OR** [DISCONTINUED]  acetaminophen, lip balm, polyethylene glycol, Resource ThickenUp Clear   Objective: Vitals:   04/10/20 0900 04/10/20 1214  BP: (!) 156/73 (!) 172/79  Pulse: 91 79  Resp: 20 18  Temp: 99.6 F (37.6 C) 99.6 F (37.6 C)  SpO2:  97%    Intake/Output Summary (Last 24 hours) at 04/10/2020 1414 Last data filed at 04/10/2020 0326 Gross per 24 hour  Intake 91.46 ml  Output 1150 ml  Net -1058.54 ml   Filed Weights   04/08/20 0409 04/09/20 0343 04/10/20 0325  Weight: 36.7 kg 36.7 kg 57.6 kg   Weight change: 20.9 kg Body mass index is 21.8 kg/m.   Physical Exam: General exam: Lethargic.  Barely able to open eyes on verbal command.  Unable to follow any other commands. Skin: No rashes, lesions or ulcers. HEENT: Atraumatic, normocephalic, supple neck, no obvious bleeding Lungs: Shallow breathing effort.  Diminished air entry in both lungs CVS: Regular rate and rhythm, no murmur GI/Abd soft, nontender, nondistended, active bleeding CNS: Lethargic, barely able to open eyes Psychiatry: Depressed look Extremities: No pedal edema, no calf tenderness  Data Review: I have personally reviewed the laboratory data and studies available.  Recent Labs  Lab 04/06/20 0408 04/07/20 0555 04/08/20 0137 04/09/20 0819 04/10/20 0251  WBC 20.3* 17.4* 20.0* 20.8* 18.4*  NEUTROABS 16.4* 13.8* 15.0* 15.2* 12.7*  HGB 7.2* 7.2* 7.8* 7.7* 7.5*  HCT 22.7* 23.0* 23.3* 24.8* 24.4*  MCV 89.7 91.3 87.9 92.9 94.6  PLT 392 404* 396 411* 387    Recent Labs  Lab 04/04/20 0315 04/04/20 0315 04/05/20 0352 04/05/20 0352 04/05/20 0942 04/06/20 0408 04/07/20 0555 04/08/20 0137 04/09/20 0819 04/10/20 0251  NA 128*   < > 128*   < >  --  137 142 144 146* 149*  K 4.7   < > 4.2   < >  --  3.6 3.8 3.7 3.7 3.7  CL 93*   < > 94*   < >  --  102 107 108 111 112*  CO2 20*   < > 19*   < >  --  21* 21* 20* 24 26  GLUCOSE 219*   < > 137*   < >  --  115* 176* 166* 178* 181*  BUN 63*   < > 77*   < >  --  65* 50* 41* 39* 38*  CREATININE 1.42*   < > 1.87*   < >  --  1.57* 1.22* 1.17* 0.99 1.02*  CALCIUM 9.7   < > 9.2   < >  --  9.1 8.9 9.1 9.2 9.3  MG 2.0  --  2.1  --   --  2.1  --   --   --   --   PHOS 3.7  --   --   --  4.5 4.1  --   --  3.5  --    < > = values in this interval not displayed.   Lab Results  Component Value Date   HGBA1C 6.9 (H) 02/15/2020       Component Value Date/Time   CHOL 223 (H) 02/16/2020 1400   TRIG 157 (H) 02/16/2020 1400   HDL 49 02/16/2020 1400   CHOLHDL 4.6 02/16/2020 1400   VLDL 31 02/16/2020 1400   LDLCALC 143 (H) 02/16/2020 1400    Signed, Lorin Glass, MD [facility] Pager: [phone] (Secure Chat preferred) 04/10/2020

## 2020-04-11 LAB — GLUCOSE, CAPILLARY
Glucose-Capillary: 170 mg/dL — ABNORMAL HIGH (ref 70–99)
Glucose-Capillary: 175 mg/dL — ABNORMAL HIGH (ref 70–99)
Glucose-Capillary: 189 mg/dL — ABNORMAL HIGH (ref 70–99)
Glucose-Capillary: 170 mg/dL — ABNORMAL HIGH (ref 70–99)
Glucose-Capillary: 165 mg/dL — ABNORMAL HIGH (ref 70–99)

## 2020-04-11 LAB — BASIC METABOLIC PANEL
Anion gap: 13 (ref 5–15)
BUN: 38 mg/dL — ABNORMAL HIGH (ref 8–23)
CO2: 24 mmol/L (ref 22–32)
Calcium: 9 mg/dL (ref 8.9–10.3)
Chloride: 114 mmol/L — ABNORMAL HIGH (ref 98–111)
Creatinine, Ser: 1.08 mg/dL — ABNORMAL HIGH (ref 0.44–1.00)
GFR calc Af Amer: 59 mL/min — ABNORMAL LOW (ref >60)
GFR calc non Af Amer: 51 mL/min — ABNORMAL LOW (ref >60)
Glucose, Bld: 197 mg/dL — ABNORMAL HIGH (ref 70–99)
Potassium: 3.5 mmol/L (ref 3.5–5.1)
Sodium: 151 mmol/L — ABNORMAL HIGH (ref 135–145)

## 2020-04-11 LAB — CBC WITH DIFFERENTIAL/PLATELET
Abs Immature Granulocytes: 1.88 10*3/uL — ABNORMAL HIGH (ref 0.00–0.07)
Basophils Absolute: 0.1 10*3/uL (ref 0.0–0.1)
Basophils Relative: 1 %
Eosinophils Absolute: 0.5 10*3/uL (ref 0.0–0.5)
Eosinophils Relative: 3 %
HCT: 24.7 % — ABNORMAL LOW (ref 36.0–46.0)
Hemoglobin: 7.7 g/dL — ABNORMAL LOW (ref 12.0–15.0)
Immature Granulocytes: 11 %
Lymphocytes Relative: 14 %
Lymphs Abs: 2.5 10*3/uL (ref 0.7–4.0)
MCH: 29.5 pg (ref 26.0–34.0)
MCHC: 31.2 g/dL (ref 30.0–36.0)
MCV: 94.6 fL (ref 80.0–100.0)
Monocytes Absolute: 0.9 10*3/uL (ref 0.1–1.0)
Monocytes Relative: 5 %
Neutro Abs: 11.6 10*3/uL — ABNORMAL HIGH (ref 1.7–7.7)
Neutrophils Relative %: 66 %
Platelets: 369 10*3/uL (ref 150–400)
RBC: 2.61 MIL/uL — ABNORMAL LOW (ref 3.87–5.11)
RDW: 16.3 % — ABNORMAL HIGH (ref 11.5–15.5)
WBC: 17.4 10*3/uL — ABNORMAL HIGH (ref 4.0–10.5)
nRBC: 0.2 % (ref 0.0–0.2)

## 2020-04-11 NOTE — Progress Notes (Signed)
PROGRESS NOTE  Tasha [name]  DOB: 06/25/1946  PCP: Patient, No Pcp Per WCB:[id number]  DOA: 03/08/2020  LOS: 34 days   Chief Complaint  Patient presents with   Emesis   Brief narrative: Patient is a 74 year old with a history of stroke, DM2, HTN, and MI who was discharged June24,2021 following a stroke work-up.   Patient presented back to the hospital on 03/08/2020 with 2-day his of intractable nausea, vomiting along and altered mental status.  On presentation, patient was found to be have lactic acidosis and acute kidney injury. Patient improved with supportive care. Due to persistent right-sided weakness that was felt to be worse than previously documented, an MRI was obtained on 03/14/20.   MRI brain revealed acute left pontine infarct and acute small infarct on the posterior body of the right lateral ventricle.  Neurology consultation obtained.  Work-up done. Patient was noted to have severe dysphagia, she was started on tube feeding.  She is still on tube feeding for several days in the hospital.  Finally she underwent PEG tube placement by IR on 8/4.  She was started on PEG tube feeding however she has had episodes of vomiting after that.  PEG tube feeding adjusted.  She also started on work-up for possibility of aspiration pneumonia. Palliative consultation appreciated.  However family is opting for full aggressive care.  CT chest/abdomen/pelvis: 8/12 1. Dense consolidations at the bilateral lung bases, RIGHT greater than LEFT, consistent with multifocal pneumonia and/or aspiration pneumonitis. 2. Additional patchy ground-glass opacities within the peripheral portions of each lung, most prominent within the upper lobes and LEFT lower lobe.  3. Masslike density within the outer LEFT breast, of uncertain significance, neoplastic mass not excluded. Recommend correlation with diagnostic mammogram at a dedicated breast center after current issues are resolved. 4. Colonic  diverticulosis without evidence of acute diverticulitis. 5. Fluid, and associated air-fluid levels, scattered throughout the large and small bowel, suggesting ileus. No evidence of bowel obstruction. 6. No free fluid or abscess collection is seen within the abdomen or pelvis. No free intraperitoneal air.  Subjective: Patient was seen and examined this morning. Looks worse.  Is open and dry mouth.  Barely able to open eyes on sternal rub. Labs from this morning shows worsening sodium level to 151 today. Unable to reach son on the phone today.  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  -Per MRI on 7/18 with history of recent CVA on June 2021 -Patient was on clopidogrel 75 mg dailyprior to admission.  Aspirin 81 mg daily with in this admission. -Per neurology note from 7/20, patient was kept on DAPT for 3 weeks till 8/10.  Since then, patient is on Plavix only.  -Continue Crestor.  Acute combined toxic and metabolic encephalopathy: -Multifactorial : acute pontine/lacunarinfarct, dehydration, dyslipidemia, hospital induced delirium, -EEGunrevealing -TSH normal-B12 and folic acid not deficient -Mental status continues to worsen without any signs of improvement.  Severe bilateral aspiration pneumonia Severe dysphagia -Patient started vomiting after PEG tube placement.  Also low-grade fever, elevated WBC count.   -CT chest obtained on 8/12.  Findings as above showing dense bilateral multifocal aspiration pneumonia. -Patient is not able to generate cough -Currently on IV Zosyn and supplemental oxygen. -Currently has PEG tube with Glucerna 1.2.  Acute hypernatremia -Sodium level continues to worsen, 151 today.   -Patient getting PEG tube feeding.  She is also getting 60 mL every 4 hours of free water.  Recent Labs  Lab 04/05/20 0352 04/06/20 0408 04/07/20 0555 04/08/20 0137 04/09/20 0819  04/10/20 0251 04/11/20 0302  NA 128* 137 142 144 146* 149* 151*   Acute kidney injury -BUN/creatinine was significantly elevated to 149/1.5 on admission.  -With IV hydration, gradually improved, creatinine 1.12 -Continue to monitor.  -Nephrology signed off.   Noninsulin-dependent type II DM 2, controlled, A1c 6.9 -Metformin on hold.  Blood sugar inconsistent.  However she has no hypoglycemic episode.  -Continue Levemir 10 units, every 4 hours sliding scale and Accu-Cheks.   Acute urinary retention -8/9, required multiple in and out catheterization.  Foley catheter was inserted.  Severe constipation -Likely contributing to vomiting and residuals in PEG tube feeding. -8/8, abdominal x-ray showed rectal fecal impaction. -Patient had a bowel movement after enema. Continue MiraLAX daily.  HTN -Home medications include amlodipine 10 mg daily, HCTZ 25 mg daily, metoprolol 25 mg twice daily.   -Currently on metoprolol 25 mg twice daily.  Continue to monitor.  Depression: Patient was on low-dose Remeron but I stopped it yesterday because of altered mentation.  Goals of care:  I have personally called and discussed patient's condition, and most likely natural course with her son Mr. Delaina Fetsch.  We had a long conversation about her CODE STATUS as well.  Given her debility, poor quality of life and unlikely return back to her baseline functionality, in my medical opinion, patient would be appropriate for DNR status at least.  -Multiple long conversations with patient's son have been done in this one month of hospitalization.   -I personally have been talking to him almost every day.  I have discussed with him patient's deteriorating clinical condition, most likely natural course and the futility of cardiac resuscitation or intubation if she is to sustain a cardiac arrest. Patient's son however states that patient had explicitly told him in the past that she would like to be kept alive 'no  matter what'.  He said he would hence leave her full code which I think is not medically appropriate at this time. -8/13 I called patient's son again. I asked the son to come to the hospital and visit his mom by himself to get a clear picture of her worsening condition. 8/14, per RN, patient's son came in and stated for very short duration, he did not ask to meet with me.  He apparently left instructions to the secretary, to keep her full code.  8/15, not reachable by phone today  Nutrition Problem: Inadequate oral intake Etiology: lethargy/confusion Mobility: Limited Code Status:   Code Status: Full Code  Nutritional status: Body mass index is 21.46 kg/m. Nutrition Problem: Inadequate oral intake Etiology: lethargy/confusion Signs/Symptoms: other (comment) (per RN report) Diet Order            Diet NPO time specified  Diet effective midnight                 DVT prophylaxis: heparin injection 5,000 Units Start: 03/31/20 2200   Antimicrobials:  IV Zosyn Fluid: No IV fluid  Consultants: Palliative care Family Communication:  None at bedside  Status is: Inpatient  Remains inpatient appropriate because:Altered mental status and IV treatments appropriate due to intensity of illness or inability to take PO  Dispo:  Patient From:   SNF  Planned Disposition:   Back to SNF  Expected discharge date:   Unlikely to survive this hospitalization.  Hospice candidate if family would agree.  Infusions:   feeding supplement (GLUCERNA 1.2 CAL) 1,000  mL (04/10/20 1028)   piperacillin-tazobactam (ZOSYN)  IV 3.375 g (04/11/20 0515)    Scheduled Meds:  chlorhexidine  15 mL Mouth Rinse BID   Chlorhexidine Gluconate Cloth  6 each Topical Daily   clopidogrel  75 mg Per Tube Daily   heparin injection (subcutaneous)  5,000 Units Subcutaneous Q8H   insulin aspart  0-9 Units Subcutaneous Q4H   insulin detemir  10 Units Subcutaneous BID   mouth rinse  15 mL Mouth Rinse BID    metoprolol tartrate  25 mg Per Tube BID   pantoprazole sodium  40 mg Per Tube Daily   polyethylene glycol  17 g Per Tube BID   rosuvastatin  5 mg Per Tube Daily    Antimicrobials: Anti-infectives (From admission, onward)   Start     Dose/Rate Route Frequency Ordered Stop   04/05/20 1800  vancomycin (VANCOREADY) IVPB 750 mg/150 mL  Status:  Discontinued        750 mg 150 mL/hr over 60 Minutes Intravenous Every 24 hours 04/04/20 1727 04/07/20 0805   04/04/20 1730  vancomycin (VANCOREADY) IVPB 1250 mg/250 mL        1,250 mg 166.7 mL/hr over 90 Minutes Intravenous  Once 04/04/20 1727 04/04/20 1948   04/04/20 1730  piperacillin-tazobactam (ZOSYN) IVPB 3.375 g     Discontinue     3.375 g 12.5 mL/hr over 240 Minutes Intravenous Every 8 hours 04/04/20 1727     04/04/20 1715  piperacillin-tazobactam (ZOSYN) IVPB 3.375 g  Status:  Discontinued       Note to Pharmacy: Adjust as per renal function   3.375 g 100 mL/hr over 30 Minutes Intravenous Every 8 hours 04/04/20 1706 04/04/20 1726   03/31/20 1207  vancomycin (VANCOCIN) 1-5 GM/200ML-% IVPB       Note to Pharmacy: Erie Noe   : cabinet override      03/31/20 1207 03/31/20 1232   03/31/20 1200  vancomycin (VANCOCIN) IVPB 1000 mg/200 mL premix        1,000 mg 200 mL/hr over 60 Minutes Intravenous  Once 03/31/20 1155 03/31/20 1509   03/10/20 1200  cefTRIAXone (ROCEPHIN) 2 g in sodium chloride 0.9 % 100 mL IVPB        2 g 200 mL/hr over 30 Minutes Intravenous Every 24 hours 03/10/20 0924 03/14/20 1333   03/09/20 1000  ceFEPIme (MAXIPIME) 1 g in sodium chloride 0.9 % 100 mL IVPB  Status:  Discontinued        1 g 200 mL/hr over 30 Minutes Intravenous Every 24 hours 03/08/20 1219 03/10/20 0924   03/08/20 1219  vancomycin variable dose per unstable renal function (pharmacist dosing)  Status:  Discontinued         Does not apply See admin instructions 03/08/20 1219 03/10/20 0906   03/08/20 1100  vancomycin (VANCOCIN) IVPB 1000 mg/200 mL  premix        1,000 mg 200 mL/hr over 60 Minutes Intravenous  Once 03/08/20 1046 03/08/20 1406   03/08/20 1100  ceFEPIme (MAXIPIME) 2 g in sodium chloride 0.9 % 100 mL IVPB        2 g 200 mL/hr over 30 Minutes Intravenous  Once 03/08/20 1046 03/08/20 1211      PRN meds: acetaminophen **OR** [DISCONTINUED] acetaminophen, lip balm, polyethylene glycol, Resource ThickenUp Clear   Objective: Vitals:   04/11/20 0329 04/11/20 0728  BP: (!) 152/71 (!) 163/75  Pulse: 80 83  Resp: 16 (!) 22  Temp: 98.2 F (36.8 C) 98.2 F (  36.8 C)  SpO2: 99% 97%    Intake/Output Summary (Last 24 hours) at 04/11/2020 1021 Last data filed at 04/11/2020 0329 Gross per 24 hour  Intake --  Output 500 ml  Net -500 ml   Filed Weights   04/09/20 0343 04/10/20 0325 04/11/20 0329  Weight: 36.7 kg 57.6 kg 56.7 kg   Weight change: -0.907 kg Body mass index is 21.46 kg/m.   Physical Exam: General exam: Lethargic.  Barely able to open eyes on verbal command.  Unable to follow any commands. Skin: No rashes, lesions or ulcers. HEENT: Atraumatic, normocephalic, no obvious bleeding Lungs: Shallow breathing effort.  Diminished air entry in both lungs CVS: Regular rate and rhythm, no murmur GI/Abd soft, nontender, nondistended, active bleeding CNS: Lethargic, barely able to open eyes Psychiatry: Depressed look Extremities: No pedal edema, no calf tenderness  Data Review: I have personally reviewed the laboratory data and studies available.  Recent Labs  Lab 04/07/20 0555 04/08/20 0137 04/09/20 0819 04/10/20 0251 04/11/20 0302  WBC 17.4* 20.0* 20.8* 18.4* 17.4*  NEUTROABS 13.8* 15.0* 15.2* 12.7* 11.6*  HGB 7.2* 7.8* 7.7* 7.5* 7.7*  HCT 23.0* 23.3* 24.8* 24.4* 24.7*  MCV 91.3 87.9 92.9 94.6 94.6  PLT 404* 396 411* 387 369   Recent Labs  Lab 04/05/20 0352 04/05/20 0352 04/05/20 0942 04/06/20 0408 04/06/20 0408 04/07/20 0555 04/08/20 0137 04/09/20 0819 04/10/20 0251 04/11/20 0302  NA 128*    < >  --  137   < > 142 144 146* 149* 151*  K 4.2   < >  --  3.6   < > 3.8 3.7 3.7 3.7 3.5  CL 94*   < >  --  102   < > 107 108 111 112* 114*  CO2 19*   < >  --  21*   < > 21* 20* 24 26 24   GLUCOSE 137*   < >  --  115*   < > 176* 166* 178* 181* 197*  BUN 77*   < >  --  65*   < > 50* 41* 39* 38* 38*  CREATININE 1.87*   < >  --  1.57*   < > 1.22* 1.17* 0.99 1.02* 1.08*  CALCIUM 9.2   < >  --  9.1   < > 8.9 9.1 9.2 9.3 9.0  MG 2.1  --   --  2.1  --   --   --   --   --   --   PHOS  --   --  4.5 4.1  --   --   --  3.5  --   --    < > = values in this interval not displayed.   Lab Results  Component Value Date   HGBA1C 6.9 (H) 02/15/2020       Component Value Date/Time   CHOL 223 (H) 02/16/2020 1400   TRIG 157 (H) 02/16/2020 1400   HDL 49 02/16/2020 1400   CHOLHDL 4.6 02/16/2020 1400   VLDL 31 02/16/2020 1400   LDLCALC 143 (H) 02/16/2020 1400    Signed, Lorin GlassBinaya Yanelle Sousa, MD [facility] Pager: [phone] (Secure Chat preferred) 04/11/2020

## 2020-04-12 DIAGNOSIS — Z7189 Other specified counseling: Secondary | ICD-10-CM

## 2020-04-12 DIAGNOSIS — J189 Pneumonia, unspecified organism: Secondary | ICD-10-CM

## 2020-04-12 DIAGNOSIS — Z515 Encounter for palliative care: Secondary | ICD-10-CM

## 2020-04-12 LAB — GLUCOSE, CAPILLARY
Glucose-Capillary: 158 mg/dL — ABNORMAL HIGH (ref 70–99)
Glucose-Capillary: 167 mg/dL — ABNORMAL HIGH (ref 70–99)
Glucose-Capillary: 171 mg/dL — ABNORMAL HIGH (ref 70–99)
Glucose-Capillary: 176 mg/dL — ABNORMAL HIGH (ref 70–99)
Glucose-Capillary: 189 mg/dL — ABNORMAL HIGH (ref 70–99)
Glucose-Capillary: 196 mg/dL — ABNORMAL HIGH (ref 70–99)

## 2020-04-12 LAB — BASIC METABOLIC PANEL
Anion gap: 12 (ref 5–15)
BUN: 34 mg/dL — ABNORMAL HIGH (ref 8–23)
CO2: 26 mmol/L (ref 22–32)
Calcium: 9.2 mg/dL (ref 8.9–10.3)
Chloride: 115 mmol/L — ABNORMAL HIGH (ref 98–111)
Creatinine, Ser: 0.92 mg/dL (ref 0.44–1.00)
GFR calc Af Amer: >60 mL/min (ref >60)
GFR calc non Af Amer: >60 mL/min (ref >60)
Glucose, Bld: 187 mg/dL — ABNORMAL HIGH (ref 70–99)
Potassium: 3.3 mmol/L — ABNORMAL LOW (ref 3.5–5.1)
Sodium: 153 mmol/L — ABNORMAL HIGH (ref 135–145)

## 2020-04-12 LAB — CBC WITH DIFFERENTIAL/PLATELET
Abs Immature Granulocytes: 1.72 10*3/uL — ABNORMAL HIGH (ref 0.00–0.07)
Basophils Absolute: 0.1 10*3/uL (ref 0.0–0.1)
Basophils Relative: 1 %
Eosinophils Absolute: 0.3 10*3/uL (ref 0.0–0.5)
Eosinophils Relative: 2 %
HCT: 27.2 % — ABNORMAL LOW (ref 36.0–46.0)
Hemoglobin: 8.3 g/dL — ABNORMAL LOW (ref 12.0–15.0)
Immature Granulocytes: 9 %
Lymphocytes Relative: 15 %
Lymphs Abs: 2.7 10*3/uL (ref 0.7–4.0)
MCH: 29.1 pg (ref 26.0–34.0)
MCHC: 30.5 g/dL (ref 30.0–36.0)
MCV: 95.4 fL (ref 80.0–100.0)
Monocytes Absolute: 0.9 10*3/uL (ref 0.1–1.0)
Monocytes Relative: 5 %
Neutro Abs: 12.6 10*3/uL — ABNORMAL HIGH (ref 1.7–7.7)
Neutrophils Relative %: 68 %
Platelets: 366 10*3/uL (ref 150–400)
RBC: 2.85 MIL/uL — ABNORMAL LOW (ref 3.87–5.11)
RDW: 16.1 % — ABNORMAL HIGH (ref 11.5–15.5)
WBC: 18.5 10*3/uL — ABNORMAL HIGH (ref 4.0–10.5)
nRBC: 0.2 % (ref 0.0–0.2)

## 2020-04-12 MED ORDER — AMLODIPINE BESYLATE 5 MG PO TABS
5.0000 mg | ORAL_TABLET | Freq: Every day | ORAL | Status: DC
  Filled 2020-04-12: qty 1

## 2020-04-12 MED ORDER — AMLODIPINE BESYLATE 5 MG PO TABS
5.0000 mg | ORAL_TABLET | Freq: Every day | ORAL | Status: DC
  Administered 2020-04-13: 5 mg
  Filled 2020-04-12: qty 1

## 2020-04-12 MED ORDER — AMLODIPINE BESYLATE 10 MG PO TABS: 10.0000 mg | ORAL_TABLET | Freq: Every day | ORAL | Status: DC

## 2020-04-12 MED ORDER — GLUCERNA 1.5 CAL PO LIQD
960.0000 mL | ORAL | Status: DC
  Administered 2020-04-12 – 2020-04-26 (×15): 960 mL
  Filled 2020-04-12 (×17): qty 1000

## 2020-04-12 MED ORDER — PROSOURCE TF PO LIQD
45.0000 mL | Freq: Every day | ORAL | Status: DC
  Administered 2020-04-12 – 2020-04-27 (×16): 45 mL
  Filled 2020-04-12 (×16): qty 45

## 2020-04-12 MED ORDER — DEXTROSE 5 % IV SOLN: INTRAVENOUS | Status: AC

## 2020-04-12 NOTE — Progress Notes (Signed)
  Speech Language Pathology Treatment: Dysphagia  Patient Details Name: Jazzlin Clements MRN: [id number] DOB: 12-Apr-1946 Today's Date: 04/12/2020 Time: 0370-4888 SLP Time Calculation (min) (ACUTE ONLY): 25 min  Assessment / Plan / Recommendation Clinical Impression  Patient seen to address dysphagia goals; she is currently NPO with PEG tube. Patient was non-verbal but alert and followed commands to open mouth, stick out tongue. She was very cooperative with aggressive oral care. SLP removed dried, some hardened secretions on hard and soft palate, tongue, gumline and teeth. Following oral care, SLP observed patient with small ice chip. She was able to move tongue slightly in attempt to control it in anterior portion of her oral cavity, and she did very slightly attempt to masticate. No swallow was observed and SLP removed ice chip from mouth. Patient exhibited mild cough during oral care and mild gagging but overall appears she has decreased sensation in oral cavity. Patient continues to not be safe with PO's and SLP will continue to follow for readiness.   HPI HPI: Ingri Diemer is a 74 y.o. female with history of stroke in June, HTN, MI and DM presenting 7/12 with nausea and vomiting, altered mental status, admitted w/ uremia and possible infection. In hospital, R sided weakness not improving, MRI showed acute left pontine infarction. Acute small infarct along the posterior body of the right lateral ventricle. Seen for swallowing during admission 02/17/20 >nectar thick then upgraded to thin/full liquids. Pt now has PEG tube for nutrition.      SLP Plan  Continue with current plan of care       Recommendations  Diet recommendations: NPO Medication Administration: Via alternative means                Oral Care Recommendations: Oral care QID Follow up Recommendations: Skilled Nursing facility;24 hour supervision/assistance SLP Visit Diagnosis: Dysphagia, unspecified (R13.10) Plan: Continue with  current plan of care       GO                Angela Nevin, MA, CCC-SLP Speech Therapy [facility] Acute Rehab Pager: [phone]

## 2020-04-12 NOTE — Progress Notes (Signed)
PROGRESS NOTE  Tasha [name]  DOB: 09-15-1945  PCP: Patient, No Pcp Per [facility]:[id number]  DOA: 03/08/2020  LOS: 35 days   Chief Complaint  Patient presents with  . Emesis   Brief narrative: Patient is a 74 year old with a history of stroke, DM2, HTN, and MI who was discharged June24,2021 following a stroke work-up.   Patient presented back to the hospital on 03/08/2020 with 2-day his of intractable nausea, vomiting along and altered mental status.  On presentation, patient was found to be have lactic acidosis and acute kidney injury. Patient improved with supportive care. Due to persistent right-sided weakness that was felt to be worse than previously documented, an MRI was obtained on 03/14/20.   MRI brain revealed acute left pontine infarct and acute small infarct on the posterior body of the right lateral ventricle.  Neurology consultation obtained.  Work-up done. Patient was noted to have severe dysphagia, she was started on tube feeding.  She is still on tube feeding for several days in the hospital.  Finally she underwent PEG tube placement by IR on 8/4.  She was started on PEG tube feeding however she has had episodes of vomiting after that.  PEG tube feeding adjusted.  She also started on work-up for possibility of aspiration pneumonia. Palliative consultation appreciated.  However family is opting for full aggressive care.  CT chest/abdomen/pelvis: 8/12 1. Dense consolidations at the bilateral lung bases, RIGHT greater than LEFT, consistent with multifocal pneumonia and/or aspiration pneumonitis. 2. Additional patchy ground-glass opacities within the peripheral portions of each lung, most prominent within the upper lobes and LEFT lower lobe.  3. Masslike density within the outer LEFT breast, of uncertain significance, neoplastic mass not excluded. Recommend correlation with diagnostic mammogram at a dedicated breast center after current issues are resolved. 4. Colonic  diverticulosis without evidence of acute diverticulitis. 5. Fluid, and associated air-fluid levels, scattered throughout the large and small bowel, suggesting ileus. No evidence of bowel obstruction. 6. No free fluid or abscess collection is seen within the abdomen or pelvis. No free intraperitoneal air.  Subjective: Patient was seen and examined this afternoon. Able to open eyes and said 'OK.' once.  Unable to follow any other commands or have a conversation. Looks dry clinically. Sodium level is getting worse despite free water through PEG tube. Palliative care reconsulted.  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  -Per MRI on 7/18 with history of recent CVA on June 2021 -Patient was on clopidogrel 75 mg dailyprior to admission.  Aspirin 81 mg daily with in this admission. -Per neurology note from 7/20, patient was kept on DAPT for 3 weeks till 8/10.  Since then, patient is on Plavix only.  -Continue Crestor.  Acute combined toxic and metabolic encephalopathy: -Multifactorial : acute pontine/lacunarinfarct, dehydration, dyslipidemia, hospital induced delirium, -EEGunrevealing -TSH normal-B12 and folic acid not deficient -Mental status continues to worsen and is highly unlikely to improve  Severe bilateral aspiration pneumonia Severe dysphagia -Patient started vomiting after PEG tube placement.  Also low-grade fever, elevated WBC count.   -CT chest obtained on 8/12.  Findings as above showing dense bilateral multifocal aspiration pneumonia. -Patient is not able to generate cough -Completed 9-day course of IV Zosyn. -Continue supplemental oxygen. -Currently has PEG tube with Glucerna 1.2.  Acute hypernatremia -Sodium level continues to worsen, 151 today.   -Patient getting PEG tube feeding.  She is also getting 60 mL every 4 hours of free  water. -Because of worsening sodium level, I will  start on D5W at 75 mL/h. Recent Labs  Lab 04/06/20 0408 04/07/20 0555 04/08/20 0137 04/09/20 0819 04/10/20 0251 04/11/20 0302 04/12/20 0227  NA 137 142 144 146* 149* 151* 153*   Acute kidney injury -BUN/creatinine was significantly elevated to 149/1.5 on admission.  -With IV hydration, gradually improved, creatinine 1.12 -Continue to monitor.  -Nephrology signed off.   Noninsulin-dependent type II DM 2, controlled, A1c 6.9 -Metformin on hold.  Blood sugar inconsistent.  However she has no hypoglycemic episode.  -Continue Levemir 10 units, every 4 hours sliding scale and Accu-Cheks.   Acute urinary retention -8/9, required multiple in and out catheterization.  Foley catheter was inserted.  Severe constipation -Likely contributing to vomiting and residuals in PEG tube feeding. -8/8, abdominal x-ray showed rectal fecal impaction. -Patient had a bowel movement after enema. Continue MiraLAX daily.  HTN -Home medications include amlodipine 10 mg daily, HCTZ 25 mg daily, metoprolol 25 mg twice daily.   -Currently on metoprolol 25 mg twice daily.  Blood pressure is elevated, resume amlodipine at 5 mg daily.  Depression -Patient was on low-dose Remeron but I stopped it because of altered mentation.  Goals of care:  I have personally called and discussed patient's condition, and most likely natural course with her son Mr. Rolande Moe.  We had a long conversation about her CODE STATUS as well.  Given her debility, poor quality of life and unlikely return back to her baseline functionality, in my medical opinion, patient would be appropriate for DNR status at least.  -Multiple providers have had multiple long conversations with patient's son and her 7 weeks of hospitalization -I personally have been talking to him almost every day.  I have discussed with him patient's deteriorating clinical condition, most likely natural course and the futility of cardiac resuscitation or intubation  if she is to sustain a cardiac arrest. Patient's son however states that patient had explicitly told him in the past that she would like to be kept alive 'no matter what'.  He said he would hence leave her full code which I think is not medically appropriate at this time. -8/13 I called patient's son again. I asked the son to come to the hospital and visit his mom by himself to get a clear picture of her worsening condition. 8/14, per RN, patient's son came in and stated for very short duration, he did not ask to meet with me.  He apparently left instructions to the secretary, to keep her full code.  8/15, not reachable by phone.  Palliative care reconsulted.  Nutrition Problem: Inadequate oral intake Etiology: lethargy/confusion Mobility: Limited Code Status:   Code Status: Full Code  Nutritional status: Body mass index is 21.46 kg/m. Nutrition Problem: Inadequate oral intake Etiology: lethargy/confusion Signs/Symptoms: other (comment) (per RN report) Diet Order            Diet NPO time specified  Diet effective midnight                 DVT prophylaxis: heparin injection 5,000 Units Start: 03/31/20 2200   Antimicrobials:  IV Zosyn Fluid: No IV fluid  Consultants: Palliative care Family Communication:  None at bedside  Status is: Inpatient  Remains inpatient appropriate because:Altered mental status and IV treatments appropriate due to intensity of illness or inability to take PO  Dispo:  Patient From:   SNF  Planned Disposition:   Back to SNF  Expected discharge date:  Unlikely to survive this hospitalization.  Hospice candidate if family would agree.  Infusions:  . feeding supplement (GLUCERNA 1.2 CAL) 1,000 mL (04/10/20 1028)    Scheduled Meds: . chlorhexidine  15 mL Mouth Rinse BID  . Chlorhexidine Gluconate Cloth  6 each Topical Daily  . clopidogrel  75 mg Per Tube Daily  . heparin injection (subcutaneous)  5,000 Units Subcutaneous Q8H  . insulin aspart  0-9  Units Subcutaneous Q4H  . insulin detemir  10 Units Subcutaneous BID  . mouth rinse  15 mL Mouth Rinse BID  . metoprolol tartrate  25 mg Per Tube BID  . pantoprazole sodium  40 mg Per Tube Daily  . rosuvastatin  5 mg Per Tube Daily    Antimicrobials: Anti-infectives (From admission, onward)   Start     Dose/Rate Route Frequency Ordered Stop   04/05/20 1800  vancomycin (VANCOREADY) IVPB 750 mg/150 mL  Status:  Discontinued        750 mg 150 mL/hr over 60 Minutes Intravenous Every 24 hours 04/04/20 1727 04/07/20 0805   04/04/20 1730  vancomycin (VANCOREADY) IVPB 1250 mg/250 mL        1,250 mg 166.7 mL/hr over 90 Minutes Intravenous  Once 04/04/20 1727 04/04/20 1948   04/04/20 1730  piperacillin-tazobactam (ZOSYN) IVPB 3.375 g  Status:  Discontinued        3.375 g 12.5 mL/hr over 240 Minutes Intravenous Every 8 hours 04/04/20 1727 04/12/20 1003   04/04/20 1715  piperacillin-tazobactam (ZOSYN) IVPB 3.375 g  Status:  Discontinued       Note to Pharmacy: Adjust as per renal function   3.375 g 100 mL/hr over 30 Minutes Intravenous Every 8 hours 04/04/20 1706 04/04/20 1726   03/31/20 1207  vancomycin (VANCOCIN) 1-5 GM/200ML-% IVPB       Note to Pharmacy: Erie Noe   : cabinet override      03/31/20 1207 03/31/20 1232   03/31/20 1200  vancomycin (VANCOCIN) IVPB 1000 mg/200 mL premix        1,000 mg 200 mL/hr over 60 Minutes Intravenous  Once 03/31/20 1155 03/31/20 1509   03/10/20 1200  cefTRIAXone (ROCEPHIN) 2 g in sodium chloride 0.9 % 100 mL IVPB        2 g 200 mL/hr over 30 Minutes Intravenous Every 24 hours 03/10/20 0924 03/14/20 1333   03/09/20 1000  ceFEPIme (MAXIPIME) 1 g in sodium chloride 0.9 % 100 mL IVPB  Status:  Discontinued        1 g 200 mL/hr over 30 Minutes Intravenous Every 24 hours 03/08/20 1219 03/10/20 0924   03/08/20 1219  vancomycin variable dose per unstable renal function (pharmacist dosing)  Status:  Discontinued         Does not apply See admin instructions  03/08/20 1219 03/10/20 0906   03/08/20 1100  vancomycin (VANCOCIN) IVPB 1000 mg/200 mL premix        1,000 mg 200 mL/hr over 60 Minutes Intravenous  Once 03/08/20 1046 03/08/20 1406   03/08/20 1100  ceFEPIme (MAXIPIME) 2 g in sodium chloride 0.9 % 100 mL IVPB        2 g 200 mL/hr over 30 Minutes Intravenous  Once 03/08/20 1046 03/08/20 1211      PRN meds: acetaminophen **OR** [DISCONTINUED] acetaminophen, lip balm, polyethylene glycol, Resource ThickenUp Clear   Objective: Vitals:   04/12/20 0803 04/12/20 1133  BP: (!) 167/76 (!) 172/72  Pulse: 81 72  Resp: 18 18  Temp: 98.3 F (36.8  C) 99.3 F (37.4 C)  SpO2: 100% 100%    Intake/Output Summary (Last 24 hours) at 04/12/2020 1358 Last data filed at 04/12/2020 0600 Gross per 24 hour  Intake 315.43 ml  Output 1250 ml  Net -934.57 ml   Filed Weights   04/09/20 0343 04/10/20 0325 04/11/20 0329  Weight: 36.7 kg 57.6 kg 56.7 kg   Weight change:  Body mass index is 21.46 kg/m.   Physical Exam: General exam: Continues to be lethargic  skin: Dry HEENT: Atraumatic, normocephalic, no obvious bleeding Lungs: Shallow breathing effort.  Diminished air entry in both lungs CVS: Regular rate and rhythm, no murmur GI/Abd soft, nontender, nondistended, active bleeding CNS: Lethargic Psychiatry: Depressed look Extremities: No pedal edema, no calf tenderness  Data Review: I have personally reviewed the laboratory data and studies available.  Recent Labs  Lab 04/08/20 0137 04/09/20 0819 04/10/20 0251 04/11/20 0302 04/12/20 0227  WBC 20.0* 20.8* 18.4* 17.4* 18.5*  NEUTROABS 15.0* 15.2* 12.7* 11.6* 12.6*  HGB 7.8* 7.7* 7.5* 7.7* 8.3*  HCT 23.3* 24.8* 24.4* 24.7* 27.2*  MCV 87.9 92.9 94.6 94.6 95.4  PLT 396 411* 387 369 366   Recent Labs  Lab 04/06/20 0408 04/07/20 0555 04/08/20 0137 04/09/20 0819 04/10/20 0251 04/11/20 0302 04/12/20 0227  NA 137   < > 144 146* 149* 151* 153*  K 3.6   < > 3.7 3.7 3.7 3.5 3.3*  CL 102    < > 108 111 112* 114* 115*  CO2 21*   < > 20* 24 26 24 26   GLUCOSE 115*   < > 166* 178* 181* 197* 187*  BUN 65*   < > 41* 39* 38* 38* 34*  CREATININE 1.57*   < > 1.17* 0.99 1.02* 1.08* 0.92  CALCIUM 9.1   < > 9.1 9.2 9.3 9.0 9.2  MG 2.1  --   --   --   --   --   --   PHOS 4.1  --   --  3.5  --   --   --    < > = values in this interval not displayed.   Lab Results  Component Value Date   HGBA1C 6.9 (H) 02/15/2020       Component Value Date/Time   CHOL 223 (H) 02/16/2020 1400   TRIG 157 (H) 02/16/2020 1400   HDL 49 02/16/2020 1400   CHOLHDL 4.6 02/16/2020 1400   VLDL 31 02/16/2020 1400   LDLCALC 143 (H) 02/16/2020 1400    Signed, Lorin GlassBinaya Ryan Ogborn, MD [facility] Pager: [phone] (Secure Chat preferred) 04/12/2020

## 2020-04-12 NOTE — Progress Notes (Signed)
Nutrition Follow-up  DOCUMENTATION CODES:   Not applicable  INTERVENTION:  Recommend addition of free water flushes due to hypernatremia   Continue TF via PEG: -Transition to Glucerna 1.5 cal'@40ml'$ /hr (971m) -414mProsource TF daily  Tube feeding will provide 1480kcals, 90grams protein, 72868mree water    NUTRITION DIAGNOSIS:   Inadequate oral intake related to lethargy/confusion as evidenced by other (comment) (per RN report).  Ongoing  GOAL:   Patient will meet greater than or equal to 90% of their needs  Met with TF  MONITOR:   PO intake, Diet advancement, TF tolerance, Labs, Weight trends  REASON FOR ASSESSMENT:   Consult Enteral/tube feeding initiation and management  ASSESSMENT:   Pt with a history of stroke in June, HTN, MI and DM presented 7/12 with N/V and AMS. Pt admitted w/ uremia and possible infection. In hospital, R sided weakness not improving, MRI showed new acute infarcts.  7/15 NGT placed (gastric), TF initiated 7/16 NGT advanced to LOT 7/17 diet advanced to full liquids, TF reduced by MD 7/23 NGT replaced with Cortrak (gastric) 7/26 pt made NPO by SLP due to dysphagia 8/4 s/p PEG  8/8 TFs held due to pt vomiting; abd xray revealed possible fecal impaction; CXR showed possible aspiration PNA 8/9 pt had BM; TF resumed 8/12 CT revealed dense bilateral multifocal aspiration pneumonia 8/14 TF rate reduced by MD to 15m12m  Pt unable to follow commands or answer questions at this time. Per MD, pt unlikely to improve and is appropriate for comfort care/hospice. Palliative Care was consulted. GOC [place] is scheduled for Wednesday.   Current TF: Glucerna 1.2 cal '@40ml'$ /hr  Labs: Na 153 (H), K+ 3.3 (L), [id number]: Novolog, Levemir, Protonix IVF: D5 @ 75ml52m Diet Order:   Diet Order            Diet NPO time specified  Diet effective midnight                 EDUCATION NEEDS:   No education needs have been  identified at this time  Skin:  Skin Assessment: Skin Integrity Issues: Skin Integrity Issues:: Other (Comment) Other: MASD perineum  Last BM:  8/15 type 7  Height:   Ht Readings from Last 1 Encounters:  03/08/20 '5\' 4"'$  (1.626 m)    Weight:   Wt Readings from Last 1 Encounters:  04/11/20 56.7 kg    BMI:  Body mass index is 21.46 kg/m.  Estimated Nutritional Needs:   Kcal:  1500-1700 kcal  Protein:  75-85 grams  Fluid:  >/= 1.8 L/day    AmandLarkin Ina RD, LDN RD pager number and weekend/on-call pager number located in [place]

## 2020-04-12 NOTE — Progress Notes (Signed)
Daily Progress Note   Tasha [name] Name: Tasha [name]       Date: 04/12/2020 DOB: 06-07-1946  Age: 75 y.o. MRN#: [id number] Attending Physician: Lorin Glass, MD Primary Care Physician: Tasha [name], No Pcp Per Admit Date: 03/08/2020  Reason for Consultation/Follow-up: Establishing goals of care  Subjective: Evaluated Tasha [name]- she is lying in bed, R eye is open. She does not look at me or verbalize. Does not squeeze my hand. Does not move extremities at all.  Noted Tasha [name] with increasing WBC, possible new aspiration pneumonia- with CT scan showing consolidations in bilateral lung bases consistent with multifocal/aspiration pneumonia/pneumonitis.  Retaining urine- requiring foley insertion. Low grade fever- 99.3 axillary. Called her son Tasha [name]- goals of care discussed. Tasha [name] has obtained temporary guardianship in an effort to be able to access her finances so that he can pay for "pre-burial contracts" and for her care. We discussed the difference between planning for after death and planning for during death.  Tasha [name] states that his priorities are to ensure that his mother's burial plans are made and paid for before she dies. We discussed code status- Tasha [name] states that he would want CPR and for her to be intubated and on mechanical ventilation so that he and his sister would be able to get to the hospital and the make a decision. When pressed a little further for the goal of prolonging her on a ventilator and having CPR in the event that she dies- Tasha [name] notes he is unsure.  Brad agreed to allow me to call Tasha [name]'s daughterRosalita [name].  Tasha [name] lives in [place]. She is planning to travel down to [facility] tomorrow and arrive late in the evening. She notes that Tasha [name] is having a hard time and she feels that with her  support and presence he will be more likely to listen to options of comfort, hospice, and change in code status.    Review of Systems  Unable to perform ROS: Tasha [name] nonverbal    Length of Stay: 35   Physical Exam Vitals and nursing note reviewed.  Constitutional:      Comments: frail  Cardiovascular:     Comments: Diffuse anasarca Neurological:     Comments: Nonverbal, does not follow commands, does not move extremities             Vital Signs: BP (!) 172/72 (  BP Location: Left Arm)   Pulse 72   Temp 99.3 F (37.4 C) (Axillary)   Resp 18   Ht 5\' 4"  (1.626 m)   Wt 56.7 kg   SpO2 100%   BMI 21.46 kg/m  SpO2: SpO2: 100 % O2 Device: O2 Device: Room Air O2 Flow Rate: O2 Flow Rate (L/min): 2 L/min        Palliative Assessment/Data: PPS: 10%     Palliative Care Assessment & Plan   Tasha [name] Profile:  74 y.o. 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  She was hypothermic and in acute renal failure.  During the hospitalization she was found to have new acute infarcts on brain MRI.  Tasha [name] is unable to eat and is being fed artificially thru a cor trak tube.  Palliative medicine ins consulting for goals of care.  Recommendations/Plan   Stroke with residual deficits- continue current plan  GOC- see above discussion- plan to meet with both 05/09/2020 and Tasha [name] (daughter and son) on Wednesday for further discussion.   Goals of Care and Additional Recommendations:  Limitations on Scope of Treatment: Full Scope Treatment  Code Status:  Full code  Prognosis:   Unable to determine  Discharge Planning:  To Be Determined  Care plan was discussed with Tasha [name]'s son, daughter, Dr. Monday and Tasha [name]'s nurse.   Thank you for allowing the Palliative Medicine Team to assist in the care of this Tasha [name].   Time In: 1300 Time Out: 1415 Total Time 75  minutes Prolonged Time Billed yes      Greater than 50%  of this time was spent counseling and coordinating care related to the above assessment and plan.  Pola Corn, AGNP-C Palliative Medicine   Please contact Palliative Medicine Team phone at [phone] for questions and concerns.

## 2020-04-13 LAB — GLUCOSE, CAPILLARY
Glucose-Capillary: 163 mg/dL — ABNORMAL HIGH (ref 70–99)
Glucose-Capillary: 183 mg/dL — ABNORMAL HIGH (ref 70–99)
Glucose-Capillary: 187 mg/dL — ABNORMAL HIGH (ref 70–99)
Glucose-Capillary: 200 mg/dL — ABNORMAL HIGH (ref 70–99)
Glucose-Capillary: 228 mg/dL — ABNORMAL HIGH (ref 70–99)
Glucose-Capillary: 233 mg/dL — ABNORMAL HIGH (ref 70–99)
Glucose-Capillary: 242 mg/dL — ABNORMAL HIGH (ref 70–99)

## 2020-04-13 MED ORDER — AMLODIPINE BESYLATE 10 MG PO TABS
10.0000 mg | ORAL_TABLET | Freq: Every day | ORAL | Status: DC
  Administered 2020-04-14 – 2020-04-27 (×13): 10 mg
  Filled 2020-04-13 (×14): qty 1

## 2020-04-13 MED ORDER — POLYETHYLENE GLYCOL 3350 17 G PO PACK: 17.0000 g | PACK | Freq: Every day | ORAL | Status: DC | PRN

## 2020-04-13 NOTE — Progress Notes (Signed)
Occupational Therapy Treatment Patient Details Name: Tasha [name] MRN: [id number] DOB: [birth year] Today's Date: 04/13/2020    History of present illness Tasha [name] is a 74 y.o. 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 In hospital, R sided weakness not improving, MRI showed new acute infarcts of L pontine and post body R lateral ventricle. Pt had Gtube placed on 04/01/2020   OT comments  Patient engaged in OT/PT session.  Oral care supine using suction, total assist for brushing but patient able to follow simple commands to open/close mouth and stick out tongue; even able to follow commands with 90% accuracy after completion of oral care task; able to follow command 2/3 times when asked to squeeze with L hand. Patient continues to require total assist +2 for bed mobility and total assist to sit EOB.  Initially able to scan with eyes supine, but fades with sitting EOB due to tolerance. Will follow as tolerated, with noted current family preference for agressive care.    Follow Up Recommendations  SNF    Equipment Recommendations  None recommended by OT    Recommendations for Other Services      Precautions / Restrictions Precautions Precautions: Fall Precaution Comments: right hemiplegia, PEG Required Braces or Orthoses: Splint/Cast Splint/Cast: bil resting hand splints- night wear only Restrictions Weight Bearing Restrictions: No       Mobility Bed Mobility Overal bed mobility: Needs Assistance Bed Mobility: Rolling;Supine to Sit;Sit to Supine Rolling: +2 for physical assistance;+2 for safety/equipment;Total assist   Supine to sit: Total assist;+2 for physical assistance;+2 for safety/equipment;HOB elevated   Sit to sidelying: Total assist;+2 for physical assistance General bed mobility comments: total assist to/from EOB and rolling to re-arrange linen  under her   Transfers                 General transfer comment: unable to participate; needs lift    Balance Overall balance assessment: Needs assistance Sitting-balance support: Feet supported Sitting balance-Leahy Scale: Zero Sitting balance - Comments: her torso is very tight (especially kyphotic) and therefore can be supported in midline with min assist, however if she is allowed to try to maintain her balance without assist, she immediately loses balance to her rt or posterior with no righting reactions Postural control: Posterior lean;Right lateral lean                                 ADL either performed or assessed with clinical judgement   ADL Overall ADL's : Needs assistance/impaired     Grooming: Oral care;Bed level Grooming Details (indicate cue type and reason): total assist for oral care, hand over hand assist; following commands to open/close mouth, stick out tongue                             Functional mobility during ADLs: Total assistance;+2 for physical assistance;+2 for safety/equipment       Vision   Additional Comments: pt able to scan with eyes to L and R initally, with fatigue fades; blinks to sound and threat   Perception     Praxis      Cognition Arousal/Alertness: Awake/alert Behavior During Therapy: Flat affect Overall Cognitive Status: Difficult to assess  General Comments: +following commands with oral movements >90% of trials (open mouth, stick out tongue, close mouth); squeezing with left hand 2 of 3 trials; became visibly upset when mentioned how beautiful her tatoos are (wrinkling forehead, eyes teary)        Exercises General Exercises - Lower Extremity Ankle Circles/Pumps: PROM;Supine;Both;5 reps Heel Slides: PROM;Both;Supine;5 reps Shoulder Exercises Neck Extension: Other (comment) (assisted in sitting (very limited ROM)) Other Exercises Other  Exercises: cervical PROM rotation   Shoulder Instructions       General Comments PROM of BUE provided supine, elevated BUEs on pillows     Pertinent Vitals/ Pain       Pain Assessment: Faces Faces Pain Scale: No hurt  Home Living                                          Prior Functioning/Environment              Frequency  Min 2X/week        Progress Toward Goals  OT Goals(current goals can now be found in the care plan section)  Progress towards OT goals: Progressing toward goals (minimally)  Acute Rehab OT Goals Patient Stated Goal: Unable to state OT Goal Formulation: Patient unable to participate in goal setting  Plan Discharge plan remains appropriate;Frequency remains appropriate    Co-evaluation    PT/OT/SLP Co-Evaluation/Treatment: Yes Reason for Co-Treatment: Complexity of the patient's impairments (multi-system involvement);For patient/therapist safety PT goals addressed during session: Mobility/safety with mobility;Balance;Strengthening/ROM OT goals addressed during session: ADL's and self-care      AM-PAC OT "6 Clicks" Daily Activity     Outcome Measure   Help from another person eating meals?: Total Help from another person taking care of personal grooming?: Total Help from another person toileting, which includes using toliet, bedpan, or urinal?: Total Help from another person bathing (including washing, rinsing, drying)?: Total Help from another person to put on and taking off regular upper body clothing?: Total Help from another person to put on and taking off regular lower body clothing?: Total 6 Click Score: 6    End of Session    OT Visit Diagnosis: Unsteadiness on feet (R26.81);History of falling (Z91.81);Muscle weakness (generalized) (M62.81);Other symptoms and signs involving the nervous system (R29.898);Other symptoms and signs involving cognitive function;Cognitive communication deficit (R41.841);Other  abnormalities of gait and mobility (R26.89)   Activity Tolerance Patient limited by fatigue   Patient Left in bed;with call bell/phone within reach;with bed alarm set   Nurse Communication Mobility status        Time: 3536-1443 OT Time Calculation (min): 31 min  Charges: OT General Charges $OT Visit: 1 Visit OT Treatments $Self Care/Home Management : 8-22 mins  Barry Brunner, OT Acute Rehabilitation Services Pager [phone] Office [phone]    Chancy Milroy 04/13/2020, 12:46 PM

## 2020-04-13 NOTE — Progress Notes (Signed)
Physical Therapy Treatment Patient Details Name: Tasha [name] MRN: [id number] DOB: 09/03/1945 Today's Date: 04/13/2020    History of Present Illness Tasha [name] is a 74 y.o. 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 In hospital, R sided weakness not improving, MRI showed new acute infarcts of L pontine and post body R lateral ventricle. Pt had Gtube placed on 04/01/2020    PT Comments    Patient seen in co-treatment with OT due to severity of deficits. Surprisingly, pt was able to follow 1 step commands >90% of trials with regards to oral movements (open mouth, stick out tongue, close mouth). She also was able to produce a weak squeeze with left hand 2 of 3 trials. She was less consistent with tracking with her eyes, however this was attempted while she was in supported sitting at EOB and she was fatigued. Noted family continues to have discussions re: the aggressiveness of care. Will discuss with SLP and OT if there is an appropriate avenue to explore use of assitive technology to improve her ability to communicate and/or interact with her environment.   Follow Up Recommendations  SNF     Equipment Recommendations  None recommended by PT    Recommendations for Other Services       Precautions / Restrictions Precautions Precautions: Fall Precaution Comments: right hemiplegia, PEG Required Braces or Orthoses: Splint/Cast Splint/Cast: bil resting hand splints- night wear only    Mobility  Bed Mobility Overal bed mobility: Needs Assistance Bed Mobility: Rolling Rolling: +2 for physical assistance;+2 for safety/equipment;Total assist   Supine to sit: Total assist;+2 for physical assistance;+2 for safety/equipment;HOB elevated   Sit to sidelying: Total assist;+2 for physical assistance General bed mobility comments: rolling for arranging linens under  her  Transfers                 General transfer comment: unable to participate; needs lift  Ambulation/Gait                 Stairs             Wheelchair Mobility    Modified Rankin (Stroke Patients Only) Modified Rankin (Stroke Patients Only) Pre-Morbid Rankin Score: Severe disability Modified Rankin: Severe disability     Balance Overall balance assessment: Needs assistance Sitting-balance support: Feet supported Sitting balance-Leahy Scale: Zero Sitting balance - Comments: her torso is very tight (especially kyphotic) and therefore can be supported in midline with min assist, however if she is allowed to try to maintain her balance without assist, she immediately loses balance to her rt or posterior with no righting reactions Postural control: Posterior lean;Right lateral lean                                  Cognition Arousal/Alertness: Awake/alert Behavior During Therapy: Flat affect Overall Cognitive Status: Difficult to assess                                 General Comments: +following commands with oral movements >90% of trials (open mouth, stick out tongue, close mouth); squeezing with left hand 2 of 3 trials; became visibly upset when mentioned how beautiful her tatoos are (wrinkling forehead, eyes teary)      Exercises General Exercises - Lower Extremity  Ankle Circles/Pumps: PROM;Supine;Both;5 reps Heel Slides: PROM;Both;Supine;5 reps Shoulder Exercises Neck Extension: Other (comment) (assisted in sitting (very limited ROM)) Other Exercises Other Exercises: cervical PROM rotation    General Comments        Pertinent Vitals/Pain Pain Assessment: Faces Faces Pain Scale: No hurt    Home Living                      Prior Function            PT Goals (current goals can now be found in the care plan section) Acute Rehab PT Goals Patient Stated Goal: Unable to state PT Goal Formulation:  Patient unable to participate in goal setting Time For Goal Achievement: 04/27/20 Potential to Achieve Goals: Poor Progress towards PT goals: Goals downgraded-see care plan    Frequency    Min 2X/week      PT Plan Current plan remains appropriate    Co-evaluation PT/OT/SLP Co-Evaluation/Treatment: Yes Reason for Co-Treatment: Complexity of the patient's impairments (multi-system involvement);For patient/therapist safety PT goals addressed during session: Mobility/safety with mobility;Balance;Strengthening/ROM        AM-PAC PT "6 Clicks" Mobility   Outcome Measure  Help needed turning from your back to your side while in a flat bed without using bedrails?: Total Help needed moving from lying on your back to sitting on the side of a flat bed without using bedrails?: Total Help needed moving to and from a bed to a chair (including a wheelchair)?: Total Help needed standing up from a chair using your arms (e.g., wheelchair or bedside chair)?: Total Help needed to walk in hospital room?: Total Help needed climbing 3-5 steps with a railing? : Total 6 Click Score: 6    End of Session   Activity Tolerance: Patient limited by fatigue Patient left: in bed;with bed alarm set Nurse Communication: Mobility status;Need for lift equipment PT Visit Diagnosis: Other abnormalities of gait and mobility (R26.89);Muscle weakness (generalized) (M62.81);Hemiplegia and hemiparesis Hemiplegia - Right/Left: Right Hemiplegia - dominant/non-dominant: Dominant Hemiplegia - caused by: Cerebral infarction     Time: 5397-6734 PT Time Calculation (min) (ACUTE ONLY): 31 min  Charges:  $Neuromuscular Re-education: 8-22 mins                      Jerolyn Center, PT Pager [phone]    Zena Amos 04/13/2020, 10:50 AM

## 2020-04-13 NOTE — Progress Notes (Signed)
PROGRESS NOTE  Tasha [name]  DOB: [birth year]  PCP: Patient, No Pcp Per [facility]:[id number]  DOA: 03/08/2020  LOS: 36 days   Chief Complaint  Patient presents with  . Emesis   Brief narrative: Patient is a 74 year old with a history of stroke, DM2, HTN, and MI who was discharged June24,2021 following a stroke work-up.   Patient presented back to the hospital on 03/08/2020 with 2-day his of intractable nausea, vomiting along and altered mental status.  On presentation, patient was found to be have lactic acidosis and acute kidney injury. Patient improved with supportive care. Due to persistent right-sided weakness that was felt to be worse than previously documented, an MRI was obtained on 03/14/20.   MRI brain revealed acute left pontine infarct and acute small infarct on the posterior body of the right lateral ventricle.  Neurology consultation obtained.  Work-up done. Patient was noted to have severe dysphagia, she was started on tube feeding.  She is still on tube feeding for several days in the hospital.  Finally she underwent PEG tube placement by IR on 8/4.  She was started on PEG tube feeding however she has had episodes of vomiting after that.  PEG tube feeding adjusted.  She also started on work-up for possibility of aspiration pneumonia. Palliative consultation appreciated.  However family is opting for full aggressive care.  CT chest/abdomen/pelvis: 8/12 1. Dense consolidations at the bilateral lung bases, RIGHT greater than LEFT, consistent with multifocal pneumonia and/or aspiration pneumonitis. 2. Additional patchy ground-glass opacities within the peripheral portions of each lung, most prominent within the upper lobes and LEFT lower lobe.  3. Masslike density within the outer LEFT breast, of uncertain significance, neoplastic mass not excluded. Recommend correlation with diagnostic mammogram at a dedicated breast center after current issues are  resolved.  Subjective: Patient was seen and examined this morning. Able to open eyes on verbal command. Seen by PT and OT later this morning.  Apparently, she was able to follow one-step commands like opening mouth, sticking out tongue and closing mouth.  Blood pressure last 24 hours in 150s.  Labs ordered for tomorrow.  Assessment/Plan: Acute combined toxic and metabolic encephalopathy: -Multifactorial: acute pontine/lacunarinfarct, hypernatremia, hospital induced delirium, -EEGunrevealing. TSH normal-B12 and folic acid not deficient -Mental status has remained altered for several days. -Today patient seen to follow one-step command. But, I am still not confident that she has a chance of meaningful recovery.  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  -Per MRI on 7/18 with history of recent CVA on June 2021 -Per neurology note from 7/20, patient was kept on DAPT for 3 weeks till 8/10.  Since then, patient is on Plavix 75 mg daily only.  -Continue Crestor.  Severe bilateral aspiration pneumonia Severe dysphagia -After PEG tube placement, patient started vomiting, she had low-grade fever and elevated WBC count.   -8/12, CT chest showed dense bilateral multifocal aspiration pneumonia. -Completed 9-day course of IV Zosyn.  -Patient is not able to generate cough.  At risk of recurrence of pneumonia.   -Continue supplemental oxygen. -Currently has PEG tube with Glucerna 1.2.  Acute hypernatremia -Sodium level continues to worsen, sodium of 153 on last check on 8/16. -Currently she is on D5W at 75 mL/h and is also getting 60 mL every 4 hours of free water. -Repeat sodium level tomorrow. Recent Labs  Lab 04/07/20 0555 04/08/20 0137 04/09/20 0819 04/10/20 0251 04/11/20 0302 04/12/20 0227  NA 142  144 146* 149* 151* 153*   Essential hypertension -Home medications include amlodipine 10 mg daily, HCTZ 25 mg daily,  metoprolol 25 mg twice daily.   -Currently on metoprolol 25 mg twice daily and amlodipine at 5 mg daily.  Blood pressure in 150s.  Increase amlodipine to 10 mg daily  Acute kidney injury -BUN/creatinine was significantly elevated to 149/1.5 on admission.  -With IV hydration, gradually improved, creatinine 1.12 -Continue to monitor.  -Nephrology signed off.   Noninsulin-dependent type II DM 2, controlled, A1c 6.9 -Metformin on hold.  Blood sugar inconsistent.  However she has no hypoglycemic episode.  -Continue Levemir 10 units twice daily, every 4 hours sliding scale and Accu-Cheks.   Acute urinary retention -8/9, required multiple in and out catheterization.  Foley catheter was inserted.  Severe constipation -Likely contributing to vomiting and residuals in PEG tube feeding. -8/8, abdominal x-ray showed rectal fecal impaction. -Patient had a bowel movement after enema. Continue MiraLAX daily.  Depression -Patient was on low-dose Remeron but I stopped it because of altered mentation.  Left breast mass -CT chest 8/12 showed a masslike density within the outer LEFT breast, of uncertain significance, neoplastic mass not excluded.  -Needs a diagnostic mammogram as an outpatient.  Goals of care:  I have personally called and discussed patient's condition, and most likely natural course with her son Mr. Hanley SeamenBrad Melgarejo.  We had a long conversation about her CODE STATUS as well.  Given her debility, poor quality of life and unlikely return back to her baseline functionality, in my medical opinion, patient would be appropriate for DNR status at least.  -Multiple providers have had multiple long conversations with patient's son and her 7 weeks of hospitalization -I personally have been talking to him almost every day.  I have discussed with him patient's deteriorating clinical condition, most likely natural course and the futility of cardiac resuscitation or intubation if she is to sustain a  cardiac arrest. Patient's son however states that patient had explicitly told him in the past that she would like to be kept alive 'no matter what'.  He said he would hence leave her full code which I think is not medically appropriate at this time. -8/13 I called patient's son again. I asked the son to come to the hospital and visit his mom by himself to get a clear picture of her worsening condition. 8/14, per RN, patient's son came in and stated for very short duration, he did not ask to meet with me.  He apparently left instructions to the secretary, to keep her full code.  8/15, not reachable by phone.  Palliative care reconsulted.  Nutrition Problem: Inadequate oral intake Etiology: lethargy/confusion Mobility: Limited Code Status:   Code Status: Full Code  Nutritional status: Body mass index is 21.46 kg/m. Nutrition Problem: Inadequate oral intake Etiology: lethargy/confusion Signs/Symptoms: other (comment) (per RN report) Diet Order            Diet NPO time specified  Diet effective midnight                 DVT prophylaxis: heparin injection 5,000 Units Start: 03/31/20 2200   Antimicrobials:  None Fluid: D5 at 75 mils per hour  Consultants: Palliative care Family Communication:  None at bedside  Status is: Inpatient  Remains inpatient appropriate because:Ongoing diagnostic testing needed not appropriate for outpatient work up and IV treatments appropriate due to intensity of illness or inability to take PO   Dispo:  Patient From:  SNF  Planned Disposition:  Back to SNF  Expected discharge date:  3 days  Medically stable for discharge:  Nondistended for discharge yet.  Ongoing hypernatremia, encephalopathy  Infusions:  . dextrose 75 mL/hr at 04/13/20 0354    Scheduled Meds: . [START ON 04/14/2020] amLODipine  10 mg Per Tube Daily  . chlorhexidine  15 mL Mouth Rinse BID  . Chlorhexidine Gluconate Cloth  6 each Topical Daily  . clopidogrel  75 mg Per Tube  Daily  . feeding supplement (GLUCERNA 1.5 CAL)  960 mL Per Tube Q24H  . feeding supplement (PROSource TF)  45 mL Per Tube Daily  . heparin injection (subcutaneous)  5,000 Units Subcutaneous Q8H  . insulin aspart  0-9 Units Subcutaneous Q4H  . insulin detemir  10 Units Subcutaneous BID  . mouth rinse  15 mL Mouth Rinse BID  . metoprolol tartrate  25 mg Per Tube BID  . pantoprazole sodium  40 mg Per Tube Daily  . rosuvastatin  5 mg Per Tube Daily    Antimicrobials: Anti-infectives (From admission, onward)   Start     Dose/Rate Route Frequency Ordered Stop   04/05/20 1800  vancomycin (VANCOREADY) IVPB 750 mg/150 mL  Status:  Discontinued        750 mg 150 mL/hr over 60 Minutes Intravenous Every 24 hours 04/04/20 1727 04/07/20 0805   04/04/20 1730  vancomycin (VANCOREADY) IVPB 1250 mg/250 mL        1,250 mg 166.7 mL/hr over 90 Minutes Intravenous  Once 04/04/20 1727 04/04/20 1948   04/04/20 1730  piperacillin-tazobactam (ZOSYN) IVPB 3.375 g  Status:  Discontinued        3.375 g 12.5 mL/hr over 240 Minutes Intravenous Every 8 hours 04/04/20 1727 04/12/20 1003   04/04/20 1715  piperacillin-tazobactam (ZOSYN) IVPB 3.375 g  Status:  Discontinued       Note to Pharmacy: Adjust as per renal function   3.375 g 100 mL/hr over 30 Minutes Intravenous Every 8 hours 04/04/20 1706 04/04/20 1726   03/31/20 1207  vancomycin (VANCOCIN) 1-5 GM/200ML-% IVPB       Note to Pharmacy: Erie Noe   : cabinet override      03/31/20 1207 03/31/20 1232   03/31/20 1200  vancomycin (VANCOCIN) IVPB 1000 mg/200 mL premix        1,000 mg 200 mL/hr over 60 Minutes Intravenous  Once 03/31/20 1155 03/31/20 1509   03/10/20 1200  cefTRIAXone (ROCEPHIN) 2 g in sodium chloride 0.9 % 100 mL IVPB        2 g 200 mL/hr over 30 Minutes Intravenous Every 24 hours 03/10/20 0924 03/14/20 1333   03/09/20 1000  ceFEPIme (MAXIPIME) 1 g in sodium chloride 0.9 % 100 mL IVPB  Status:  Discontinued        1 g 200 mL/hr over 30  Minutes Intravenous Every 24 hours 03/08/20 1219 03/10/20 0924   03/08/20 1219  vancomycin variable dose per unstable renal function (pharmacist dosing)  Status:  Discontinued         Does not apply See admin instructions 03/08/20 1219 03/10/20 0906   03/08/20 1100  vancomycin (VANCOCIN) IVPB 1000 mg/200 mL premix        1,000 mg 200 mL/hr over 60 Minutes Intravenous  Once 03/08/20 1046 03/08/20 1406   03/08/20 1100  ceFEPIme (MAXIPIME) 2 g in sodium chloride 0.9 % 100 mL IVPB        2 g 200 mL/hr over 30 Minutes Intravenous  Once  03/08/20 1046 03/08/20 1211      PRN meds: acetaminophen **OR** [DISCONTINUED] acetaminophen, lip balm, polyethylene glycol, Resource ThickenUp Clear   Objective: Vitals:   04/13/20 0745 04/13/20 1056  BP: (!) 157/66 (!) 165/66  Pulse: 72 77  Resp: 18   Temp: 98 F (36.7 C)   SpO2: 97%     Intake/Output Summary (Last 24 hours) at 04/13/2020 1113 Last data filed at 04/13/2020 0345 Gross per 24 hour  Intake 992.89 ml  Output 850 ml  Net 142.89 ml   Filed Weights   04/10/20 0325 04/11/20 0329 04/13/20 0500  Weight: 57.6 kg 56.7 kg 56.7 kg   Weight change:  Body mass index is 21.46 kg/m.   Physical Exam: General exam: Comfortable.  Skin: Dry HEENT: Atraumatic, normocephalic, no obvious bleeding Lungs: Shallow breathing effort.  Diminished air entry in both lungs CVS: Regular rate and rhythm, no murmur GI/Abd soft, nontender, nondistended, active bleeding CNS: Able to open eyes and follow commands. Psychiatry: Depressed look Extremities: No pedal edema, no calf tenderness  Data Review: I have personally reviewed the laboratory data and studies available.  Recent Labs  Lab 04/08/20 0137 04/09/20 0819 04/10/20 0251 04/11/20 0302 04/12/20 0227  WBC 20.0* 20.8* 18.4* 17.4* 18.5*  NEUTROABS 15.0* 15.2* 12.7* 11.6* 12.6*  HGB 7.8* 7.7* 7.5* 7.7* 8.3*  HCT 23.3* 24.8* 24.4* 24.7* 27.2*  MCV 87.9 92.9 94.6 94.6 95.4  PLT 396 411* 387 369  366   Recent Labs  Lab 04/08/20 0137 04/09/20 0819 04/10/20 0251 04/11/20 0302 04/12/20 0227  NA 144 146* 149* 151* 153*  K 3.7 3.7 3.7 3.5 3.3*  CL 108 111 112* 114* 115*  CO2 20* 24 26 24 26   GLUCOSE 166* 178* 181* 197* 187*  BUN 41* 39* 38* 38* 34*  CREATININE 1.17* 0.99 1.02* 1.08* 0.92  CALCIUM 9.1 9.2 9.3 9.0 9.2  PHOS  --  3.5  --   --   --    Lab Results  Component Value Date   HGBA1C 6.9 (H) 02/15/2020       Component Value Date/Time   CHOL 223 (H) 02/16/2020 1400   TRIG 157 (H) 02/16/2020 1400   HDL 49 02/16/2020 1400   CHOLHDL 4.6 02/16/2020 1400   VLDL 31 02/16/2020 1400   LDLCALC 143 (H) 02/16/2020 1400    Signed, 02/18/2020, MD [facility] Pager: [phone] (Secure Chat preferred) 04/13/2020

## 2020-04-14 LAB — CBC WITH DIFFERENTIAL/PLATELET
Abs Immature Granulocytes: 0.63 10*3/uL — ABNORMAL HIGH (ref 0.00–0.07)
Basophils Absolute: 0.1 10*3/uL (ref 0.0–0.1)
Basophils Relative: 1 %
Eosinophils Absolute: 0.5 10*3/uL (ref 0.0–0.5)
Eosinophils Relative: 3 %
HCT: 26 % — ABNORMAL LOW (ref 36.0–46.0)
Hemoglobin: 8 g/dL — ABNORMAL LOW (ref 12.0–15.0)
Immature Granulocytes: 4 %
Lymphocytes Relative: 18 %
Lymphs Abs: 2.8 10*3/uL (ref 0.7–4.0)
MCH: 29.2 pg (ref 26.0–34.0)
MCHC: 30.8 g/dL (ref 30.0–36.0)
MCV: 94.9 fL (ref 80.0–100.0)
Monocytes Absolute: 0.6 10*3/uL (ref 0.1–1.0)
Monocytes Relative: 4 %
Neutro Abs: 10.4 10*3/uL — ABNORMAL HIGH (ref 1.7–7.7)
Neutrophils Relative %: 70 %
Platelets: 330 10*3/uL (ref 150–400)
RBC: 2.74 MIL/uL — ABNORMAL LOW (ref 3.87–5.11)
RDW: 17 % — ABNORMAL HIGH (ref 11.5–15.5)
WBC: 15 10*3/uL — ABNORMAL HIGH (ref 4.0–10.5)
nRBC: 0.1 % (ref 0.0–0.2)

## 2020-04-14 LAB — GLUCOSE, CAPILLARY
Glucose-Capillary: 157 mg/dL — ABNORMAL HIGH (ref 70–99)
Glucose-Capillary: 159 mg/dL — ABNORMAL HIGH (ref 70–99)
Glucose-Capillary: 173 mg/dL — ABNORMAL HIGH (ref 70–99)
Glucose-Capillary: 155 mg/dL — ABNORMAL HIGH (ref 70–99)
Glucose-Capillary: 176 mg/dL — ABNORMAL HIGH (ref 70–99)
Glucose-Capillary: 178 mg/dL — ABNORMAL HIGH (ref 70–99)

## 2020-04-14 LAB — BASIC METABOLIC PANEL
Anion gap: 10 (ref 5–15)
BUN: 35 mg/dL — ABNORMAL HIGH (ref 8–23)
CO2: 25 mmol/L (ref 22–32)
Calcium: 8.5 mg/dL — ABNORMAL LOW (ref 8.9–10.3)
Chloride: 107 mmol/L (ref 98–111)
Creatinine, Ser: 0.7 mg/dL (ref 0.44–1.00)
GFR calc Af Amer: >60 mL/min (ref >60)
GFR calc non Af Amer: >60 mL/min (ref >60)
Glucose, Bld: 165 mg/dL — ABNORMAL HIGH (ref 70–99)
Potassium: 3.5 mmol/L (ref 3.5–5.1)
Sodium: 142 mmol/L (ref 135–145)

## 2020-04-14 NOTE — Progress Notes (Signed)
Daily Progress Note   Patient Name: Tasha [name]       Date: 04/14/2020 DOB: 1946/07/15  Age: 74 y.o. MRN#: [id number] Attending Physician: Desiree Hane, MD Primary Care Physician: Patient, No Pcp Per Admit Date: 03/08/2020  Reason for Consultation/Follow-up: Establishing goals of care  Subjective: Met at bedside with patient's daughter- Tasha [name]. Tasha [name] tells me that her mom does not look well. Tasha [name] notes "its time". She plans to discuss plan of care with her brother tonight. Tasha [name] has been focusing heavily on preparing financially for Itzayanna's death and is having a hard time emotionally accepting that her death is imminent.  We discussed the role of artifical nutrition and hydration at end of life. Discussed the difference between continued aggressive medical interventions vs comfort care and allowing for natural dying process with dignity.  Residential hospice placement was discussed.  I recommended change in code status to DNR.   Review of Systems  Unable to perform ROS: Patient unresponsive    Length of Stay: 37  Current Medications: Scheduled Meds:  . amLODipine  10 mg Per Tube Daily  . chlorhexidine  15 mL Mouth Rinse BID  . Chlorhexidine Gluconate Cloth  6 each Topical Daily  . clopidogrel  75 mg Per Tube Daily  . feeding supplement (GLUCERNA 1.5 CAL)  960 mL Per Tube Q24H  . feeding supplement (PROSource TF)  45 mL Per Tube Daily  . heparin injection (subcutaneous)  5,000 Units Subcutaneous Q8H  . insulin aspart  0-9 Units Subcutaneous Q4H  . insulin detemir  10 Units Subcutaneous BID  . mouth rinse  15 mL Mouth Rinse BID  . metoprolol tartrate  25 mg Per Tube BID  . pantoprazole sodium  40 mg Per Tube Daily  . rosuvastatin  5 mg Per Tube Daily    Continuous  Infusions:   PRN Meds: acetaminophen **OR** [DISCONTINUED] acetaminophen, lip balm, polyethylene glycol, Resource ThickenUp Clear  Physical Exam Vitals and nursing note reviewed.  Constitutional:      Appearance: She is ill-appearing.  Cardiovascular:     Comments: Diffuse anasarca Neurological:     Comments: Does not arouse or respond to voice or touch             Vital Signs: BP (!) 141/61 (BP Location: Left Arm)   Pulse 70  Temp 97.9 F (36.6 C) (Oral)   Resp 18   Ht $R'5\' 4"'IV$  (1.626 m)   Wt 56.7 kg   SpO2 100%   BMI 21.46 kg/m  SpO2: SpO2: 100 % O2 Device: O2 Device: Room Air O2 Flow Rate: O2 Flow Rate (L/min): 2 L/min  Intake/output summary:   Intake/Output Summary (Last 24 hours) at 04/14/2020 1326 Last data filed at 04/14/2020 0300 Gross per 24 hour  Intake --  Output 650 ml  Net -650 ml   LBM: Last BM Date: 04/11/20 Baseline Weight: Weight: 58.5 kg Most recent weight: Weight: 56.7 kg       Palliative Assessment/Data: PPS: 10%    Flowsheet Rows     Most Recent Value  Intake Tab  Referral Department Hospitalist  Unit at Time of Referral Intermediate Care Unit  Palliative Care Primary Diagnosis Neurology  Date Notified 03/24/20  Palliative Care Type New Palliative care  Reason for referral Counsel Regarding Hospice, Clarify Goals of Care  Date of Admission 03/08/20  Date first seen by Palliative Care 03/25/20  # of days Palliative referral response time 1 Day(s)  # of days IP prior to Palliative referral 16  Clinical Assessment  Psychosocial & Spiritual Assessment  Palliative Care Outcomes      Patient Active Problem List   Diagnosis Date Noted  . Pneumonia of both lungs due to infectious organism   . Advanced care planning/counseling discussion   . Goals of care, counseling/discussion   . Palliative care by specialist   . Palliative care encounter   . Hyperkalemia   . Sepsis ([place])   . Coffee ground emesis   . Lactic acidosis 03/08/2020   . Essential (primary) hypertension   . History of falling   . Diabetes mellitus without complication ([place])   . Nausea and vomiting   . AKI (acute kidney injury) ([place])   . DM (diabetes mellitus), type 2 with peripheral vascular complications ([place]) 29/92/4268  . Atherosclerosis of aorta ([place]) 02/20/2020  . Poorly-controlled hypertension 02/20/2020  . Dyslipidemia 02/20/2020  . Fall 02/15/2020  . CVA (cerebral vascular accident) ([place]) 02/15/2020    Palliative Care Assessment & Plan   Patient Profile: 74 y.o.femalewith past medical history of CVA in June 2021, CAD with MI and HTN, DM, recurrent fallswho was admitted on7/12/2021with altered mental status and vomiting. She was hypothermic and in acute renal failure. During the hospitalization she was found to have new acute infarcts on brain MRI. Patient is unable to eat and is being fed artificially through a cor trak tube. Palliative medicine is consulting for goals of care.  Assessment/Recommendations/Plan   Continue current care  Plan for followup tomorrow- Tasha [name] could not participate in conference call today- Tasha [name] to talk with him tonight  Code Status:  Full code  Prognosis:   Unable to determine  Discharge Planning:  To Be Determined  Care plan was discussed with patient's daughter- Tasha [name].    Thank you for allowing the Palliative Medicine Team to assist in the care of this patient.   Time In: 1325 Time Out: 1400 Total Time 35 mins Prolonged Time Billed no      Greater than 50%  of this time was spent counseling and coordinating care related to the above assessment and plan.  Mariana Kaufman, AGNP-C Palliative Medicine   Please contact Palliative Medicine Team phone at [phone] for questions and concerns.

## 2020-04-14 NOTE — Progress Notes (Signed)
[facility]  PROGRESS NOTE  Tasha [name] VQQ:[id number] DOB: 1946/03/23 DOA: 03/08/2020 PCP: Patient, No Pcp Per Admit date - 03/08/2020   Admitting Physician A Grier Mitts., MD  Outpatient Primary MD for the patient is Patient, No Pcp Per  LOS - 37 Brief Narrative   Tasha [name] is a 74 y.o. year old female with medical history significant for type 2 diabetes, HTN, MI, recent CVA (01/2020) who presented on 03/08/2020 with changes in mental status noted at her facility, intractable nausea vomiting and was found to initially have AKI with lactic acidosis that improved with supportive care.  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  Hospital course complicated by severe dysphagia requiring initiation of tube feeds followed by PEG tube placement on 8/4 as well as empiric Zosyn for aspiration pneumonia. .  Palliative care was consulted to assist in goals of care discussion due to patient's poor prognosis in meaningful recovery  Subjective  Today I spoke with her patient's daughter Tasha [name] at the bedside.  She is already talking with the palliative care team member Lora Havens would like to focus more on her mom's comfort but needs to discuss further with her brother  A & P   Acute combined toxic/metabolic encephalopathy, multifactorial etiology including acute CVA, hypernatremia (currently resolved), hospital-acquired delirium.  EEG unrevealing, B12/TSH within normal limits.  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  Based off MRI on 7/18.  In setting of recent CVA on 6/21.  Completed DAPT for 3  weeks. -Currently on Plavix as recommended by neurology -Continue Crestor  Severe bilateral aspiration pneumonia in setting of severe dysphagia. -Continue PEG tube -Status post completion of 9-day course of IV Zosyn -At risk of recurrent pneumonia given inability to generate cough -Appreciate palliative care consultation to assist with goals of care discussions with family  Acute hyponatremia, intermittently stable.  Peak of 153, now down to 142 with IV fluids and free water.  Expect this to continue to fluctuate given patient unable to tolerate p.o. -Monitor BMP  HTN, stable -Continue amlodipine 10 mg, metoprolol 25 mg twice daily  AKI, improved with IV fluids.  Suspect prerenal on admission.  Nephrology long following.  Type 2 diabetes, A1c 6.9, controlled -Holding home Metformin -Continue to monitor CBGs, Levemir 10 units twice daily,/as needed acute urinary retention, resolved with Foley catheter placement 8/9 -Monitor output -Severe constipation -Fecal impaction on abdominal x-ray on 8/8, improved with optimization of bowel regimen-continue MiraLAX  Depression -Discontinue low-dose Remeron due to altered mentation  Left breast mass, seen on CT chest on 8/12 of uncertain significance -Pending goals of care discussion would need diagnostic mammogram as outpatient  Goals of care discussion.  Previous providers have had discussion with patient's son during her hospitalization and her poor prognosis. -Appreciate palliative care assistance      Family Communication  : Spoke with daughter Tasha [name] at bedside  Code Status : Full code  Disposition Plan  :  Patient is from home. Anticipated d/c date:  Greater than 3 days barriers to d/c or necessity for inpatient status:  Continue goals of care discussions with family, close monitoring of mental status  for any improvement Consults  : Palliative  Procedures  :    DVT Prophylaxis  : Plavix  Lab Results  Component Value Date    PLT 330 04/14/2020    Diet :  Diet Order            Diet NPO time specified  Diet effective midnight                  Inpatient Medications Scheduled Meds: . amLODipine  10 mg Per Tube Daily  . chlorhexidine  15 mL Mouth Rinse BID  . Chlorhexidine Gluconate Cloth  6 each Topical Daily  . clopidogrel  75 mg Per Tube Daily  . feeding supplement (GLUCERNA 1.5 CAL)  960 mL Per Tube Q24H  . feeding supplement (PROSource TF)  45 mL Per Tube Daily  . heparin injection (subcutaneous)  5,000 Units Subcutaneous Q8H  . insulin aspart  0-9 Units Subcutaneous Q4H  . insulin detemir  10 Units Subcutaneous BID  . mouth rinse  15 mL Mouth Rinse BID  . metoprolol tartrate  25 mg Per Tube BID  . pantoprazole sodium  40 mg Per Tube Daily  . rosuvastatin  5 mg Per Tube Daily   Continuous Infusions: PRN Meds:.acetaminophen **OR** [DISCONTINUED] acetaminophen, lip balm, polyethylene glycol, Resource ThickenUp Clear  Antibiotics  :   Anti-infectives (From admission, onward)   Start     Dose/Rate Route Frequency Ordered Stop   04/05/20 1800  vancomycin (VANCOREADY) IVPB 750 mg/150 mL  Status:  Discontinued        750 mg 150 mL/hr over 60 Minutes Intravenous Every 24 hours 04/04/20 1727 04/07/20 0805   04/04/20 1730  vancomycin (VANCOREADY) IVPB 1250 mg/250 mL        1,250 mg 166.7 mL/hr over 90 Minutes Intravenous  Once 04/04/20 1727 04/04/20 1948   04/04/20 1730  piperacillin-tazobactam (ZOSYN) IVPB 3.375 g  Status:  Discontinued        3.375 g 12.5 mL/hr over 240 Minutes Intravenous Every 8 hours 04/04/20 1727 04/12/20 1003   04/04/20 1715  piperacillin-tazobactam (ZOSYN) IVPB 3.375 g  Status:  Discontinued       Note to Pharmacy: Adjust as per renal function   3.375 g 100 mL/hr over 30 Minutes Intravenous Every 8 hours 04/04/20 1706 04/04/20 1726   03/31/20 1207  vancomycin (VANCOCIN) 1-5 GM/200ML-% IVPB       Note to Pharmacy: [name], [name]   : cabinet override      03/31/20 1207  03/31/20 1232   03/31/20 1200  vancomycin (VANCOCIN) IVPB 1000 mg/200 mL premix        1,000 mg 200 mL/hr over 60 Minutes Intravenous  Once 03/31/20 1155 03/31/20 1509   03/10/20 1200  cefTRIAXone (ROCEPHIN) 2 g in sodium chloride 0.9 % 100 mL IVPB        2 g 200 mL/hr over 30 Minutes Intravenous Every 24 hours 03/10/20 0924 03/14/20 1333   03/09/20 1000  ceFEPIme (MAXIPIME) 1 g in sodium chloride 0.9 % 100 mL IVPB  Status:  Discontinued        1 g 200 mL/hr over 30 Minutes Intravenous Every 24 hours 03/08/20 1219 03/10/20 0924   03/08/20 1219  vancomycin variable dose per unstable renal function (pharmacist dosing)  Status:  Discontinued         Does not apply See admin instructions 03/08/20 1219 03/10/20 0906   03/08/20 1100  vancomycin (VANCOCIN) IVPB 1000 mg/200 mL premix  1,000 mg 200 mL/hr over 60 Minutes Intravenous  Once 03/08/20 1046 03/08/20 1406   03/08/20 1100  ceFEPIme (MAXIPIME) 2 g in sodium chloride 0.9 % 100 mL IVPB        2 g 200 mL/hr over 30 Minutes Intravenous  Once 03/08/20 1046 03/08/20 1211       Objective   Vitals:   04/14/20 0805 04/14/20 0819 04/14/20 1239 04/14/20 1538  BP: 140/62 139/80 (!) 141/61 (!) 150/61  Pulse: 72 72 70 74  Resp: 18 16 18 18   Temp: 98 F (36.7 C)  97.9 F (36.6 C) 97.7 F (36.5 C)  TempSrc:   Oral Oral  SpO2: 99% 99% 100% 98%  Weight:      Height:        SpO2: 98 % O2 Flow Rate (L/min): 2 L/min  Wt Readings from Last 3 Encounters:  04/13/20 56.7 kg  02/24/20 62.1 kg  02/20/20 66.8 kg     Intake/Output Summary (Last 24 hours) at 04/14/2020 1908 Last data filed at 04/14/2020 1700 Gross per 24 hour  Intake 0 ml  Output 1650 ml  Net -1650 ml    Physical Exam:    Lying in bed comfortably, opens eyes to voice, not following commands Normal respiratory effort on 2 L, lungs clear bilaterally Abdomen soft, nondistended No peripheral edema Regular rate and rhythm   I have personally reviewed the  following:   Data Reviewed:  CBC Recent Labs  Lab 04/09/20 0819 04/10/20 0251 04/11/20 0302 04/12/20 0227 04/14/20 0130  WBC 20.8* 18.4* 17.4* 18.5* 15.0*  HGB 7.7* 7.5* 7.7* 8.3* 8.0*  HCT 24.8* 24.4* 24.7* 27.2* 26.0*  PLT 411* 387 369 366 330  MCV 92.9 94.6 94.6 95.4 94.9  MCH 28.8 29.1 29.5 29.1 29.2  MCHC 31.0 30.7 31.2 30.5 30.8  RDW 16.2* 16.3* 16.3* 16.1* 17.0*  LYMPHSABS 2.2 2.4 2.5 2.7 2.8  MONOABS 0.9 0.9 0.9 0.9 0.6  EOSABS 0.6* 0.5 0.5 0.3 0.5  BASOSABS 0.1 0.1 0.1 0.1 0.1    Chemistries  Recent Labs  Lab 04/09/20 0819 04/10/20 0251 04/11/20 0302 04/12/20 0227 04/14/20 0130  NA 146* 149* 151* 153* 142  K 3.7 3.7 3.5 3.3* 3.5  CL 111 112* 114* 115* 107  CO2 24 26 24 26 25   GLUCOSE 178* 181* 197* 187* 165*  BUN 39* 38* 38* 34* 35*  CREATININE 0.99 1.02* 1.08* 0.92 0.70  CALCIUM 9.2 9.3 9.0 9.2 8.5*   ------------------------------------------------------------------------------------------------------------------ No results for input(s): CHOL, HDL, LDLCALC, TRIG, CHOLHDL, LDLDIRECT in the last 72 hours.  Lab Results  Component Value Date   HGBA1C 6.9 (H) 02/15/2020   ------------------------------------------------------------------------------------------------------------------ No results for input(s): TSH, T4TOTAL, T3FREE, THYROIDAB in the last 72 hours.  Invalid input(s): FREET3 ------------------------------------------------------------------------------------------------------------------ No results for input(s): VITAMINB12, FOLATE, FERRITIN, TIBC, IRON, RETICCTPCT in the last 72 hours.  Coagulation profile No results for input(s): INR, PROTIME in the last 168 hours.  No results for input(s): DDIMER in the last 72 hours.  Cardiac Enzymes No results for input(s): CKMB, TROPONINI, MYOGLOBIN in the last 168 hours.  Invalid input(s):  CK ------------------------------------------------------------------------------------------------------------------ No results found for: BNP  Micro Results Recent Results (from the past 240 hour(s))  Culture, blood (routine x 2)     Status: None   Collection Time: 04/04/20  7:14 PM   Specimen: BLOOD RIGHT HAND  Result Value Ref Range Status   Specimen Description BLOOD RIGHT HAND  Final   Special Requests   Final  BOTTLES DRAWN AEROBIC AND ANAEROBIC Blood Culture results may not be optimal due to an inadequate volume of blood received in culture bottles   Culture   Final    NO GROWTH 5 DAYS Performed at [facility], [facility], [address] [place], [place] [address]    Report Status 04/09/2020 FINAL  Final    Radiology Reports CT ABDOMEN PELVIS WO CONTRAST  Result Date: 04/08/2020 CLINICAL DATA:  Fever, leukocytosis. Pneumonia, effusion or abscess suspected. EXAM: CT CHEST, ABDOMEN AND PELVIS WITHOUT CONTRAST TECHNIQUE: Multidetector CT imaging of the chest, abdomen and pelvis was performed following the standard protocol without IV contrast. COMPARISON:  CT abdomen dated 03/08/2020. FINDINGS: CT CHEST FINDINGS Cardiovascular: No thoracic aortic aneurysm. No pericardial effusion. Thoracic aortic atherosclerosis. Coronary artery calcifications, particularly dense within the LEFT anterior descending coronary artery. Mediastinum/Nodes: No mass or enlarged lymph nodes are seen within the mediastinum. Esophagus is unremarkable. Trachea appears normal. Lungs/Pleura: Dense consolidations at the bilateral lung bases, RIGHT greater than LEFT. Additional patchy ground-glass opacities within the peripheral portions of each lung, most prominent within the upper lobes and LEFT lower lobe. No pleural effusion is seen. No pneumothorax. Musculoskeletal: No acute or suspicious osseous finding. Masslike density within the outer LEFT breast, of uncertain significance. CT ABDOMEN PELVIS FINDINGS  Hepatobiliary: No focal liver abnormality. Gallbladder is unremarkable. No bile duct dilatation. Pancreas: Unremarkable. No pancreatic ductal dilatation or surrounding inflammatory changes. Spleen: Normal in size without focal abnormality. Adrenals/Urinary Tract: Adrenal glands appear normal. Kidneys are unremarkable without mass, stone or hydronephrosis. No perinephric fluid. Bladder is decompressed by Foley catheter. Stomach/Bowel: No dilated large or small bowel loops. Fluid, and associated air-fluid levels scattered throughout the large and small bowel, suggesting ileus. Diverticulosis of the sigmoid and descending colon but no focal inflammatory change to suggest acute diverticulitis. Gastrostomy tube appears appropriately positioned. Vascular/Lymphatic: Aortic atherosclerosis. No enlarged lymph nodes seen. Reproductive: No adnexal mass or free fluid Other: No free fluid or abscess collection is seen within the abdomen or pelvis. No free intraperitoneal air. Musculoskeletal: No acute or suspicious osseous finding. Surgical changes of kyphoplasty/vertebroplasty at the L2 vertebral body level. IMPRESSION: 1. Dense consolidations at the bilateral lung bases, RIGHT greater than LEFT, consistent with multifocal pneumonia and/or aspiration pneumonitis. 2. Additional patchy ground-glass opacities within the peripheral portions of each lung, most prominent within the upper lobes and LEFT lower lobe. Differential includes atypical pneumonias such as viral or fungal, interstitial pneumonias, edema related to volume overload/CHF, chronic interstitial diseases, hypersensitivity pneumonitis, and respiratory bronchiolitis. COVID-19 pneumonia can have this appearance. 3. Masslike density within the outer LEFT breast, of uncertain significance, neoplastic mass not excluded. Recommend correlation with diagnostic mammogram at a dedicated breast center after current issues are resolved. 4. Colonic diverticulosis without evidence  of acute diverticulitis. 5. Fluid, and associated air-fluid levels, scattered throughout the large and small bowel, suggesting ileus. No evidence of bowel obstruction. 6. No free fluid or abscess collection is seen within the abdomen or pelvis. No free intraperitoneal air. Aortic Atherosclerosis (ICD10-I70.0). Electronically Signed   By: Bary Richard M.D.   On: 04/08/2020 13:35   DG Abd 1 View  Result Date: 04/04/2020 CLINICAL DATA:  Abdominal distension, vomiting, fever EXAM: ABDOMEN - 1 VIEW COMPARISON:  03/19/2020 abdominal radiograph FINDINGS: Percutaneous gastrostomy tube terminates over the body of the stomach. Vertebroplasty changes again noted at L2. no disproportionately dilated small bowel loops. Moderate colorectal stool volume with dilation of the rectum to 8.9 cm diameter. No evidence of pneumatosis  or pneumoperitoneum. Clear lung bases. IMPRESSION: Nonobstructive bowel gas pattern. Moderate colorectal stool volume, with dilation of the rectum to 8.9 cm, cannot exclude rectal fecal impaction. Electronically Signed   By: Delbert Phenix M.D.   On: 04/04/2020 12:56   CT CHEST WO CONTRAST  Result Date: 04/08/2020 CLINICAL DATA:  Fever, leukocytosis. Pneumonia, effusion or abscess suspected. EXAM: CT CHEST, ABDOMEN AND PELVIS WITHOUT CONTRAST TECHNIQUE: Multidetector CT imaging of the chest, abdomen and pelvis was performed following the standard protocol without IV contrast. COMPARISON:  CT abdomen dated 03/08/2020. FINDINGS: CT CHEST FINDINGS Cardiovascular: No thoracic aortic aneurysm. No pericardial effusion. Thoracic aortic atherosclerosis. Coronary artery calcifications, particularly dense within the LEFT anterior descending coronary artery. Mediastinum/Nodes: No mass or enlarged lymph nodes are seen within the mediastinum. Esophagus is unremarkable. Trachea appears normal. Lungs/Pleura: Dense consolidations at the bilateral lung bases, RIGHT greater than LEFT. Additional patchy ground-glass  opacities within the peripheral portions of each lung, most prominent within the upper lobes and LEFT lower lobe. No pleural effusion is seen. No pneumothorax. Musculoskeletal: No acute or suspicious osseous finding. Masslike density within the outer LEFT breast, of uncertain significance. CT ABDOMEN PELVIS FINDINGS Hepatobiliary: No focal liver abnormality. Gallbladder is unremarkable. No bile duct dilatation. Pancreas: Unremarkable. No pancreatic ductal dilatation or surrounding inflammatory changes. Spleen: Normal in size without focal abnormality. Adrenals/Urinary Tract: Adrenal glands appear normal. Kidneys are unremarkable without mass, stone or hydronephrosis. No perinephric fluid. Bladder is decompressed by Foley catheter. Stomach/Bowel: No dilated large or small bowel loops. Fluid, and associated air-fluid levels scattered throughout the large and small bowel, suggesting ileus. Diverticulosis of the sigmoid and descending colon but no focal inflammatory change to suggest acute diverticulitis. Gastrostomy tube appears appropriately positioned. Vascular/Lymphatic: Aortic atherosclerosis. No enlarged lymph nodes seen. Reproductive: No adnexal mass or free fluid Other: No free fluid or abscess collection is seen within the abdomen or pelvis. No free intraperitoneal air. Musculoskeletal: No acute or suspicious osseous finding. Surgical changes of kyphoplasty/vertebroplasty at the L2 vertebral body level. IMPRESSION: 1. Dense consolidations at the bilateral lung bases, RIGHT greater than LEFT, consistent with multifocal pneumonia and/or aspiration pneumonitis. 2. Additional patchy ground-glass opacities within the peripheral portions of each lung, most prominent within the upper lobes and LEFT lower lobe. Differential includes atypical pneumonias such as viral or fungal, interstitial pneumonias, edema related to volume overload/CHF, chronic interstitial diseases, hypersensitivity pneumonitis, and respiratory  bronchiolitis. COVID-19 pneumonia can have this appearance. 3. Masslike density within the outer LEFT breast, of uncertain significance, neoplastic mass not excluded. Recommend correlation with diagnostic mammogram at a dedicated breast center after current issues are resolved. 4. Colonic diverticulosis without evidence of acute diverticulitis. 5. Fluid, and associated air-fluid levels, scattered throughout the large and small bowel, suggesting ileus. No evidence of bowel obstruction. 6. No free fluid or abscess collection is seen within the abdomen or pelvis. No free intraperitoneal air. Aortic Atherosclerosis (ICD10-I70.0). Electronically Signed   By: Bary Richard M.D.   On: 04/08/2020 13:35   IR GASTROSTOMY TUBE MOD SED  Result Date: 03/31/2020 INDICATION: 74 year old female with a history of dysphagia EXAM: PERC PLACEMENT GASTROSTOMY MEDICATIONS: Vancomycin 1 gm IV; Antibiotics were administered within 1 hour of the procedure. ANESTHESIA/SEDATION: Versed 0.5 mg IV; Fentanyl 25 mcg IV Moderate Sedation Time:  10 minutes The patient was continuously monitored during the procedure by the interventional radiology nurse under my direct supervision. CONTRAST:  32mL OMNIPAQUE IOHEXOL 300 MG/ML SOLN - administered into the gastric lumen. FLUOROSCOPY TIME:  Fluoroscopy Time:  2 minutes 54 seconds (6 mGy). COMPLICATIONS: None PROCEDURE: Informed written consent was obtained from the patient and the patient's family after a thorough discussion of the procedural risks, benefits and alternatives. All questions were addressed. Maximal Sterile Barrier Technique was utilized including caps, mask, sterile gowns, sterile gloves, sterile drape, hand hygiene and skin antiseptic. A timeout was performed prior to the initiation of the procedure. The epigastrium was prepped with Betadine in a sterile fashion, and a sterile drape was applied covering the operative field. A sterile gown and sterile gloves were used for the procedure.  A 5-French orogastric tube is placed under fluoroscopic guidance. Scout imaging of the abdomen confirms barium within the transverse colon. The stomach was distended with gas. Under fluoroscopic guidance, an 18 gauge needle was utilized to puncture the anterior wall of the body of the stomach. An Amplatz wire was advanced through the needle passing a T fastener into the lumen of the stomach. The T fastener was secured for gastropexy. A 9-French sheath was inserted. A snare was advanced through the 9-French sheath. A Teena Dunk was advanced through the orogastric tube. It was snared then pulled out the oral cavity, pulling the snare, as well. The leading edge of the gastrostomy was attached to the snare. It was then pulled down the esophagus and out the percutaneous site. Tube secured in place. Contrast was injected. Patient tolerated the procedure well and remained hemodynamically stable throughout. No complications were encountered and no significant blood loss encountered. IMPRESSION: Status post fluoroscopic placed percutaneous gastrostomy tube, with 20 [place] pull-through. Signed, Yvone Neu. Loreta Ave, DO [facility] Electronically Signed   By: Gilmer Mor D.O.   On: 03/31/2020 12:59   DG CHEST PORT 1 VIEW  Result Date: 04/04/2020 CLINICAL DATA:  Fever and vomiting. EXAM: PORTABLE CHEST 1 VIEW COMPARISON:  03/28/2020 FINDINGS: Feeding tube has been removed. Stable elevation of the RIGHT hemidiaphragm. There is minimal subsegmental atelectasis at the LEFT lung base. No consolidations or pleural effusions. No pulmonary edema. IMPRESSION: Minimal LEFT lower lobe atelectasis. Electronically Signed   By: Norva Pavlov M.D.   On: 04/04/2020 12:34   DG CHEST PORT 1 VIEW  Result Date: 03/28/2020 CLINICAL DATA:  74 year old with fever and vomiting. EXAM: PORTABLE CHEST 1 VIEW COMPARISON:  03/09/2020 and earlier. FINDINGS: Cardiac silhouette normal in size,  unchanged. Thoracic aorta tortuous and atherosclerotic, unchanged. Chronic elevation of the RIGHT hemidiaphragm with associated scar/atelectasis at the RIGHT lung base. Calcified RIGHT hilar lymph nodes again noted as is the calcified granuloma in the RIGHT lung apex. Lungs otherwise clear. No localized airspace consolidation. No pleural effusions. No pneumothorax. Normal pulmonary vascularity. IMPRESSION: 1. No acute cardiopulmonary disease. 2. Chronic elevation of the RIGHT hemidiaphragm with associated scar/atelectasis at the RIGHT lung base. 3. Old granulomatous disease. Electronically Signed   By: Hulan Saas M.D.   On: 03/28/2020 15:20   DG Abd Portable 1V  Result Date: 03/19/2020 CLINICAL DATA:  Feeding tube placement EXAM: PORTABLE ABDOMEN - 1 VIEW COMPARISON:  03/12/2020 FINDINGS: Weighted enteric feeding tube tip terminates within the expected location of the distal stomach. Visualized bowel gas pattern is nonspecific. No gross free intraperitoneal air. Cement augmentation of L2. Healed posterior rib fractures involving the right tenth and eleventh ribs. IMPRESSION: Weighted enteric feeding tube tip terminates within the expected location of the distal stomach. Electronically Signed   By: Duanne Guess D.O.   On: 03/19/2020 14:36     Time Spent in minutes  30     Laverna Peace M.D on 04/14/2020 at 7:08 PM  To page go to [email or web address] - password [email or web address]

## 2020-04-15 LAB — CBC WITH DIFFERENTIAL/PLATELET
Abs Immature Granulocytes: 0.39 10*3/uL — ABNORMAL HIGH (ref 0.00–0.07)
Basophils Absolute: 0.1 10*3/uL (ref 0.0–0.1)
Basophils Relative: 1 %
Eosinophils Absolute: 0.4 10*3/uL (ref 0.0–0.5)
Eosinophils Relative: 3 %
HCT: 26.3 % — ABNORMAL LOW (ref 36.0–46.0)
Hemoglobin: 8.2 g/dL — ABNORMAL LOW (ref 12.0–15.0)
Immature Granulocytes: 3 %
Lymphocytes Relative: 17 %
Lymphs Abs: 2.2 10*3/uL (ref 0.7–4.0)
MCH: 30.3 pg (ref 26.0–34.0)
MCHC: 31.2 g/dL (ref 30.0–36.0)
MCV: 97 fL (ref 80.0–100.0)
Monocytes Absolute: 0.6 10*3/uL (ref 0.1–1.0)
Monocytes Relative: 4 %
Neutro Abs: 9.4 10*3/uL — ABNORMAL HIGH (ref 1.7–7.7)
Neutrophils Relative %: 72 %
Platelets: 381 10*3/uL (ref 150–400)
RBC: 2.71 MIL/uL — ABNORMAL LOW (ref 3.87–5.11)
RDW: 17.4 % — ABNORMAL HIGH (ref 11.5–15.5)
WBC: 12.9 10*3/uL — ABNORMAL HIGH (ref 4.0–10.5)
nRBC: 0.2 % (ref 0.0–0.2)

## 2020-04-15 LAB — GLUCOSE, CAPILLARY
Glucose-Capillary: 153 mg/dL — ABNORMAL HIGH (ref 70–99)
Glucose-Capillary: 158 mg/dL — ABNORMAL HIGH (ref 70–99)
Glucose-Capillary: 212 mg/dL — ABNORMAL HIGH (ref 70–99)
Glucose-Capillary: 174 mg/dL — ABNORMAL HIGH (ref 70–99)
Glucose-Capillary: 166 mg/dL — ABNORMAL HIGH (ref 70–99)
Glucose-Capillary: 173 mg/dL — ABNORMAL HIGH (ref 70–99)

## 2020-04-15 LAB — BASIC METABOLIC PANEL
Anion gap: 10 (ref 5–15)
BUN: 36 mg/dL — ABNORMAL HIGH (ref 8–23)
CO2: 24 mmol/L (ref 22–32)
Calcium: 8.7 mg/dL — ABNORMAL LOW (ref 8.9–10.3)
Chloride: 109 mmol/L (ref 98–111)
Creatinine, Ser: 0.72 mg/dL (ref 0.44–1.00)
GFR calc Af Amer: >60 mL/min (ref >60)
GFR calc non Af Amer: >60 mL/min (ref >60)
Glucose, Bld: 179 mg/dL — ABNORMAL HIGH (ref 70–99)
Potassium: 4.2 mmol/L (ref 3.5–5.1)
Sodium: 143 mmol/L (ref 135–145)

## 2020-04-15 NOTE — Progress Notes (Signed)
Physical Therapy Treatment Patient Details Name: Tasha [name] MRN: [id number] DOB: 07-Oct-1945 Today's Date: 04/15/2020    History of Present Illness Tasha [name] is a 74 y.o. 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 In hospital, R sided weakness not improving, MRI showed new acute infarcts of L pontine and post body R lateral ventricle. Pt had Gtube placed on 04/01/2020    PT Comments    Patient remains limited with communication during session. Session focus on ROM for UE/LE with no activation noted from with exception of light Lt hand grip during exercises. Pt with Lt UE shaking and tone during PROM, greater with D2 compared to D1 but improved after 10 reps D1 ROM. Retrograde massage performed for edema management of bil UE's and pt repositioned with Rt UE elevated in Lt sidelying to help with edema reduction. Attempted communication with pt via blinking however uncertain at times if pt was blinkg for "Yes/No" or for general need. EOS pt positioned to encourage Lt cervical rotation and gentle stretching performed to work towards new ROM goals. Will continue to work with and progress pt as able.    Follow Up Recommendations  SNF     Equipment Recommendations  None recommended by PT    Recommendations for Other Services       Precautions / Restrictions Precautions Precautions: Fall Precaution Comments: right hemiplegia, PEG Required Braces or Orthoses: Splint/Cast Splint/Cast: bil resting hand splints- night wear only Restrictions Weight Bearing Restrictions: No    Mobility  Bed Mobility Overal bed mobility: Needs Assistance Bed Mobility: Rolling Rolling: Total assist;+2 for physical assistance;+2 for safety/equipment         General bed mobility comments: Total assist to roll for repositioning in bed for pressure relief.   Transfers            Ambulation/Gait            Stairs             Wheelchair Mobility    Modified Rankin (Stroke Patients Only)          Cognition Arousal/Alertness: Awake/alert Behavior During Therapy: Flat affect Overall Cognitive Status: Difficult to assess                                        Exercises General Exercises - Lower Extremity Ankle Circles/Pumps: Both;10 reps;Supine;PROM Heel Slides: PROM;Both;10 reps;Supine Hip ABduction/ADduction: Both;10 reps;PROM;Supine Other Exercises Other Exercises: Int/Ext rotation at Bil Hips, 10 reps, PROM in supine Other Exercises: 10 reps D1 and D2 shoulder PROM on Bil UE's in supine (semi chair position) Other Exercises: 5x Lt cervical rotation with gentle stretch and gentle massage to Rt upper trapezius.  Other Exercises: Gentle retrograde edema massage to Bil UE's (3 mins each)    General Comments        Pertinent Vitals/Pain Pain Assessment: Faces Faces Pain Scale: No hurt Pain Intervention(s): Monitored during session;Limited activity within patient's tolerance;Repositioned           PT Goals (current goals can now be found in the care plan section) Acute Rehab PT Goals Patient Stated Goal: Unable to state PT Goal Formulation: Patient unable to participate in goal setting Time For Goal Achievement: 04/27/20 Potential to Achieve Goals: Poor Progress towards PT goals: Progressing toward goals  Frequency    Min 2X/week      PT Plan Current plan remains appropriate       AM-PAC PT "6 Clicks" Mobility   Outcome Measure  Help needed turning from your back to your side while in a flat bed without using bedrails?: Total Help needed moving from lying on your back to sitting on the side of a flat bed without using bedrails?: Total Help needed moving to and from a bed to a chair (including a wheelchair)?: Total Help needed standing up from a chair using your arms (e.g., wheelchair or bedside  chair)?: Total Help needed to walk in hospital room?: Total Help needed climbing 3-5 steps with a railing? : Total 6 Click Score: 6    End of Session   Activity Tolerance: Patient tolerated treatment well Patient left: in bed;with bed alarm set Nurse Communication: Mobility status;Need for lift equipment PT Visit Diagnosis: Other abnormalities of gait and mobility (R26.89);Muscle weakness (generalized) (M62.81);Hemiplegia and hemiparesis Hemiplegia - Right/Left: Right Hemiplegia - dominant/non-dominant: Dominant Hemiplegia - caused by: Cerebral infarction     Time: 2355-7322 PT Time Calculation (min) (ACUTE ONLY): 35 min  Charges:  $Therapeutic Activity: 8-22 mins $Massage: 8-22 mins                     Wynn Maudlin, DPT Acute Rehabilitation Services  Office [phone] Pager [phone]  04/15/2020 4:10 PM

## 2020-04-15 NOTE — Progress Notes (Signed)
Daily Progress Note   Patient Name: Tasha [name]       Date: 04/15/2020 DOB: October 23, 1945  Age: 74 y.o. MRN#: [id number] Attending Physician: Laverna Peace, MD Primary Care Physician: Patient, No Pcp Per Admit Date: 03/08/2020  Reason for Consultation/Follow-up: Establishing goals of care    Subjective: Patient in bed, awake. Able to follow commands open mouth. Not able to reliably communicate with hand squeezes or eye blinks.  I stayed and observed SLP evaluation. Able to swallow three ice chips with SLP before wearing out. She began crying when I mentioned Suzanne's name.  Attempted to call [name], left message requesting return call.   Review of Systems  Unable to perform ROS: Acuity of condition    Length of Stay: 38  Current Medications: Scheduled Meds:  . amLODipine  10 mg Per Tube Daily  . chlorhexidine  15 mL Mouth Rinse BID  . Chlorhexidine Gluconate Cloth  6 each Topical Daily  . clopidogrel  75 mg Per Tube Daily  . feeding supplement (GLUCERNA 1.5 CAL)  960 mL Per Tube Q24H  . feeding supplement (PROSource TF)  45 mL Per Tube Daily  . heparin injection (subcutaneous)  5,000 Units Subcutaneous Q8H  . insulin aspart  0-9 Units Subcutaneous Q4H  . insulin detemir  10 Units Subcutaneous BID  . mouth rinse  15 mL Mouth Rinse BID  . metoprolol tartrate  25 mg Per Tube BID  . pantoprazole sodium  40 mg Per Tube Daily  . rosuvastatin  5 mg Per Tube Daily    Continuous Infusions:   PRN Meds: acetaminophen **OR** [DISCONTINUED] acetaminophen, lip balm, polyethylene glycol, Resource ThickenUp Clear  Physical Exam          Vital Signs: BP 132/60 (BP Location: Left Arm)   Pulse 72   Temp 98.6 F (37 C) (Axillary)   Resp 18   Ht 5\' 4"  (1.626 m)   Wt 56.7 kg   SpO2 97%    BMI 21.46 kg/m  SpO2: SpO2: 97 % O2 Device: O2 Device: Room Air O2 Flow Rate: O2 Flow Rate (L/min): 2 L/min  Intake/output summary:   Intake/Output Summary (Last 24 hours) at 04/15/2020 1645 Last data filed at 04/15/2020 0310 Gross per 24 hour  Intake 0 ml  Output 300 ml  Net -300 ml  LBM: Last BM Date: 04/11/20 Baseline Weight: Weight: 58.5 kg Most recent weight: Weight: 56.7 kg       Palliative Assessment/Data:    Flowsheet Rows     Most Recent Value  Intake Tab  Referral Department Hospitalist  Unit at Time of Referral Intermediate Care Unit  Palliative Care Primary Diagnosis Neurology  Date Notified 03/24/20  Palliative Care Type New Palliative care  Reason for referral Counsel Regarding Hospice, Clarify Goals of Care  Date of Admission 03/08/20  Date first seen by Palliative Care 03/25/20  # of days Palliative referral response time 1 Day(s)  # of days IP prior to Palliative referral 16  Clinical Assessment  Psychosocial & Spiritual Assessment  Palliative Care Outcomes      Patient Active Problem List   Diagnosis Date Noted  . Pneumonia of both lungs due to infectious organism   . Advanced care planning/counseling discussion   . Goals of care, counseling/discussion   . Palliative care by specialist   . Palliative care encounter   . Hyperkalemia   . Sepsis (HCC)   . Coffee ground emesis   . Lactic acidosis 03/08/2020  . Essential (primary) hypertension   . History of falling   . Diabetes mellitus without complication (HCC)   . Nausea and vomiting   . AKI (acute kidney injury) (HCC)   . DM (diabetes mellitus), type 2 with peripheral vascular complications (HCC) 02/20/2020  . Atherosclerosis of aorta (HCC) 02/20/2020  . Poorly-controlled hypertension 02/20/2020  . Dyslipidemia 02/20/2020  . Fall 02/15/2020  . CVA (cerebral vascular accident) (HCC) 02/15/2020    Palliative Care Assessment & Plan   Patient Profile: 74 y.o.femalewith past  medical history of CVA in June 2021, CAD with MI and HTN, DM, recurrent fallswho was admitted on7/12/2021with altered mental status and vomiting. She was hypothermic and in acute renal failure. During the hospitalization she was found to have new acute infarcts on brain MRI. Patient is unable to eat and is being fed artificially through a cor trak tube.Palliative medicine is consulting for goals of care.  Assessment/Recommendations/Plan   S/p Pontine infact. Locked in appearance- have had multiple discussions with son and daughter. Plan was to have conference call with [name] and [name] today, however, [name] did not return my call.   Goals of Care and Additional Recommendations:  Limitations on Scope of Treatment: Full Scope Treatment  Code Status:  Full code  Prognosis:   Unable to determine  Discharge Planning:  To Be Determined  Care plan was discussed with Dr. Mal Misty.  Thank you for allowing the Palliative Medicine Team to assist in the care of this patient.   Time In: 1500 Time Out: 1535 Total Time 35 minutes Prolonged Time Billed no      Greater than 50%  of this time was spent counseling and coordinating care related to the above assessment and plan.  Ocie Bob, AGNP-C Palliative Medicine   Please contact Palliative Medicine Team phone at [phone] for questions and concerns.

## 2020-04-15 NOTE — Progress Notes (Addendum)
  Speech Language Pathology Treatment: Dysphagia  Patient Details Name: Tasha [name] MRN: [id number] DOB: 03-05-1946 Today's Date: 04/15/2020 Time: 2549-8264 SLP Time Calculation (min) (ACUTE ONLY): 22 min  Assessment / Plan / Recommendation Clinical Impression  SLP followed up for ongoing PO readiness trials, palliative care present during session. Extensive oral care completed by SLP, pt continues to exhibit poor management of secretions with copious dried palatal secretions and dispersed throughout oral cavity; excellent response to diligent oral care; recommend QID oral care. Pt stimulable to single ice chips. She had improvements as compared to prior SLP encounters per chart review. Pt was able to minimally masticate ice chip with delayed AP transit, palpable swallow noted in 3/4 attempts. Pt did exhibit fatigue as trials progressed. Pt unable to phonate on command. Larygneal elevation per palpation appeared weakened. No overt s/sx of aspiration exhibited however silent aspiration risk remains high. This date pt appears to be candidate for single ice chips following extensive oral care as pt tolerates. SLP to continue assist with family/caregiver training with POs for comfort      HPI HPI: Tasha [name] is a 74 y.o. female with history of stroke in June, HTN, MI and DM presenting 7/12 with nausea and vomiting, altered mental status, admitted w/ uremia and possible infection. In hospital, R sided weakness not improving, MRI showed acute left pontine infarction. Acute small infarct along the posterior body of the right lateral ventricle. Seen for swallowing during admission 02/17/20 >nectar thick then upgraded to thin/full liquids. Pt now has PEG tube for nutrition.      SLP Plan  Continue with current plan of care       Recommendations  Diet recommendations: NPO Medication Administration: Via alternative means  Single ice chips following diligent oral care as tolerated                  Oral Care Recommendations: Oral care QID Follow up Recommendations: Skilled Nursing facility;24 hour supervision/assistance SLP Visit Diagnosis: Dysphagia, oropharyngeal phase (R13.12) Plan: Continue with current plan of care       GO                Ridley Schewe E Ogle Hoeffner  MA, CCC-SLP Acute Rehabilitation Services 04/15/2020, 1:42 PM

## 2020-04-15 NOTE — Progress Notes (Signed)
Nutrition Follow-up  DOCUMENTATION CODES:   Not applicable  INTERVENTION:   Continue TF via PEG: -Glucerna 1.5 cal$RemoveBeforeDE'@40ml'[id number]$ /hr (944ml) -40ml Prosource TF daily  Tube feeding will provide 1480kcals, 90grams protein, 767ml free water   NUTRITION DIAGNOSIS:   Inadequate oral intake related to lethargy/confusion as evidenced by other (comment) (per RN report).  Ongoing  GOAL:   Patient will meet greater than or equal to 90% of their needs  Met with TF  MONITOR:   PO intake, Diet advancement, TF tolerance, Labs, Weight trends  REASON FOR ASSESSMENT:   Consult Enteral/tube feeding initiation and management  ASSESSMENT:   Pt with a history of stroke in June, HTN, MI and DM presented 7/12 with N/V and AMS. Pt admitted w/ uremia and possible infection. In hospital, R sided weakness not improving, MRI showed new acute infarcts.  7/15 NGT placed (gastric), TF initiated 7/16 NGT advanced to LOT 7/17 diet advanced to full liquids, TF reduced by MD 7/23 NGT replaced with Cortrak (gastric) 7/26 pt made NPO by SLP due to dysphagia 8/4 s/p PEG  8/8 TFs held due to pt vomiting; abd xray revealed possible fecal impaction; CXR showed possible aspiration PNA 8/9 pt had BM; TF resumed 8/12 CT revealed dense bilateral multifocal aspiration pneumonia 8/14 TF rate reduced by MD to 71ml/hr   [place] meeting took place yesterday with Palliative Care and pt's daughter. Per Palliative Care, pt's daughter was to speak with the pt's son last night to come to a decision regarding pt's medical management. Palliative Care to follow up today.   Current TF: Glucerna 1.5 cal @ 72ml/hr 1ml Prosource TF daily  Labs: CBGs 153-178 Medications: Novolog, Levemir, Protonix  Diet Order:   Diet Order            Diet NPO time specified  Diet effective midnight                 EDUCATION NEEDS:   No education needs have been identified at this time  Skin:  Skin Assessment: Skin Integrity  Issues: Skin Integrity Issues:: Other (Comment) Other: MASD perineum  Last BM:  8/15 type 7  Height:   Ht Readings from Last 1 Encounters:  03/08/20 $RemoveB'5\' 4"'[id number]$  (1.626 m)    Weight:   Wt Readings from Last 1 Encounters:  04/13/20 56.7 kg   BMI:  Body mass index is 21.46 kg/m.  Estimated Nutritional Needs:   Kcal:  1500-1700 kcal  Protein:  75-85 grams  Fluid:  >/= 1.8 L/day    Larkin Ina, MS, RD, LDN RD pager number and weekend/on-call pager number located in [place].

## 2020-04-15 NOTE — Progress Notes (Signed)
[facility]  PROGRESS NOTE  Tasha [name] GDJ:[id number] DOB: August 23, 1946 DOA: 03/08/2020 PCP: Patient, No Pcp Per Admit date - 03/08/2020   Admitting Physician A Grier Mitts., MD  Outpatient Primary MD for the patient is Patient, No Pcp Per  LOS - 38 Brief Narrative   Tasha [name] is a 74 y.o. year old female with medical history significant for type 2 diabetes, HTN, MI, recent CVA (01/2020) who presented on 03/08/2020 with changes in mental status noted at her facility, intractable nausea vomiting and was found to initially have AKI with lactic acidosis that improved with supportive care.  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  Hospital course complicated by severe dysphagia requiring initiation of tube feeds followed by PEG tube placement on 8/4 as well as empiric Zosyn for aspiration pneumonia. .  Palliative care was consulted to assist in goals of care discussion due to patient's poor prognosis in meaningful recovery  Subjective  No family at bedside today.  Patient opens eyes to voice, seems to be able to follow simple one-step commands (stick tongue out, blink eyes) A & P   Acute combined toxic/metabolic encephalopathy, multifactorial etiology including acute CVA, hypernatremia (currently resolved), hospital-acquired delirium.  EEG unrevealing, B12/TSH within normal limits.  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  Based off MRI on 7/18.  In setting of recent CVA on 6/21.  Completed DAPT for 3 weeks. -Currently on Plavix as recommended by neurology -Continue Crestor  Severe bilateral  aspiration pneumonia in setting of severe dysphagia, resolved -Continue PEG tube -Status post completion of 9-day course of IV Zosyn -At risk of recurrent pneumonia given inability to generate cough -Appreciate palliative care consultation to assist with goals of care discussions with family  Acute hyperatremia, resolved.  Peak of 153, now down to 142 with IV fluids and free water.  Expect this to continue to fluctuate given patient unable to tolerate p.o. -Monitor BMP, not currently requiring fluids  HTN, stable -Continue amlodipine 10 mg, metoprolol 25 mg twice daily  AKI, resolved with IV fluids.  Suspect prerenal on admission.  Nephrology no longer following.  Type 2 diabetes, A1c 6.9, controlled -Holding home Metformin -Continue to monitor CBGs, Levemir 10 units twice daily,/as needed   Acute urinary retention, resolved with Foley catheter placement 8/9 -Monitor output  Severe constipation -Fecal impaction on abdominal x-ray on 8/8, improved with optimization of bowel regimen -continue MiraLAX  Depression -Discontinued low-dose Remeron due to altered mentation  Left breast mass, seen on CT chest on 8/12 of uncertain significance -Pending goals of care discussion would need diagnostic mammogram as outpatient  Goals of care discussion.  Previous providers have had discussion with patient's son during her hospitalization and her poor prognosis.  I spoke with daughter Tasha [name] 08/18 and son by phone on 8/18, still considering when they would like to pursue comfort care measures -Appreciate palliative care assistance      Family Communication  : Spoke with daughter Tasha [name] at bedside on 8/18, spoke with son by phone on 8/18  Code Status : Full code  Disposition Plan  :  Patient is from home. Anticipated d/c date:  Greater  than 3 days barriers to d/c or necessity for inpatient status:  Continue goals of care discussions with family, close monitoring of mental status for any  improvement Consults  : Palliative  Procedures  :    DVT Prophylaxis  : Plavix  Lab Results  Component Value Date   PLT 381 04/15/2020    Diet :  Diet Order            Diet NPO time specified  Diet effective midnight                  Inpatient Medications Scheduled Meds: . amLODipine  10 mg Per Tube Daily  . chlorhexidine  15 mL Mouth Rinse BID  . Chlorhexidine Gluconate Cloth  6 each Topical Daily  . clopidogrel  75 mg Per Tube Daily  . feeding supplement (GLUCERNA 1.5 CAL)  960 mL Per Tube Q24H  . feeding supplement (PROSource TF)  45 mL Per Tube Daily  . heparin injection (subcutaneous)  5,000 Units Subcutaneous Q8H  . insulin aspart  0-9 Units Subcutaneous Q4H  . insulin detemir  10 Units Subcutaneous BID  . mouth rinse  15 mL Mouth Rinse BID  . metoprolol tartrate  25 mg Per Tube BID  . pantoprazole sodium  40 mg Per Tube Daily  . rosuvastatin  5 mg Per Tube Daily   Continuous Infusions: PRN Meds:.acetaminophen **OR** [DISCONTINUED] acetaminophen, lip balm, polyethylene glycol, Resource ThickenUp Clear  Antibiotics  :   Anti-infectives (From admission, onward)   Start     Dose/Rate Route Frequency Ordered Stop   04/05/20 1800  vancomycin (VANCOREADY) IVPB 750 mg/150 mL  Status:  Discontinued        750 mg 150 mL/hr over 60 Minutes Intravenous Every 24 hours 04/04/20 1727 04/07/20 0805   04/04/20 1730  vancomycin (VANCOREADY) IVPB 1250 mg/250 mL        1,250 mg 166.7 mL/hr over 90 Minutes Intravenous  Once 04/04/20 1727 04/04/20 1948   04/04/20 1730  piperacillin-tazobactam (ZOSYN) IVPB 3.375 g  Status:  Discontinued        3.375 g 12.5 mL/hr over 240 Minutes Intravenous Every 8 hours 04/04/20 1727 04/12/20 1003   04/04/20 1715  piperacillin-tazobactam (ZOSYN) IVPB 3.375 g  Status:  Discontinued       Note to Pharmacy: Adjust as per renal function   3.375 g 100 mL/hr over 30 Minutes Intravenous Every 8 hours 04/04/20 1706 04/04/20 1726   03/31/20 1207   vancomycin (VANCOCIN) 1-5 GM/200ML-% IVPB       Note to Pharmacy: [name], [name]   : cabinet override      03/31/20 1207 03/31/20 1232   03/31/20 1200  vancomycin (VANCOCIN) IVPB 1000 mg/200 mL premix        1,000 mg 200 mL/hr over 60 Minutes Intravenous  Once 03/31/20 1155 03/31/20 1509   03/10/20 1200  cefTRIAXone (ROCEPHIN) 2 g in sodium chloride 0.9 % 100 mL IVPB        2 g 200 mL/hr over 30 Minutes Intravenous Every 24 hours 03/10/20 0924 03/14/20 1333   03/09/20 1000  ceFEPIme (MAXIPIME) 1 g in sodium chloride 0.9 % 100 mL IVPB  Status:  Discontinued        1 g 200 mL/hr over 30 Minutes Intravenous Every 24 hours 03/08/20 1219 03/10/20 0924   03/08/20 1219  vancomycin variable dose per unstable renal function (pharmacist dosing)  Status:  Discontinued         Does not apply  See admin instructions 03/08/20 1219 03/10/20 0906   03/08/20 1100  vancomycin (VANCOCIN) IVPB 1000 mg/200 mL premix        1,000 mg 200 mL/hr over 60 Minutes Intravenous  Once 03/08/20 1046 03/08/20 1406   03/08/20 1100  ceFEPIme (MAXIPIME) 2 g in sodium chloride 0.9 % 100 mL IVPB        2 g 200 mL/hr over 30 Minutes Intravenous  Once 03/08/20 1046 03/08/20 1211       Objective   Vitals:   04/14/20 2313 04/15/20 0310 04/15/20 0811 04/15/20 1135  BP: (!) 139/59 (!) 139/58 133/70 (!) 144/61  Pulse: 75 79 84 70  Resp: Temp: 98.7 F (37.1 C) 98.3 F (36.8 C) 97.6 F (36.4 C) 98.5 F (36.9 C)  TempSrc: Oral Oral Axillary Axillary  SpO2: 100% 99% 99% 98%  Weight:      Height:        SpO2: 98 % O2 Flow Rate (L/min): 2 L/min  Wt Readings from Last 3 Encounters:  04/13/20 56.7 kg  02/24/20 62.1 kg  02/20/20 66.8 kg     Intake/Output Summary (Last 24 hours) at 04/15/2020 1421 Last data filed at 04/15/2020 0310 Gross per 24 hour  Intake 0 ml  Output 1300 ml  Net -1300 ml    Physical Exam:    Lying in bed comfortably, opens eyes to voice, sticks tongue out on command, otherwise  not able to move any of her extremities Normal respiratory effort on 2 L, lungs clear bilaterally Abdomen soft, nondistended No peripheral edema Regular rate and rhythm   I have personally reviewed the following:   Data Reviewed:  CBC Recent Labs  Lab 04/10/20 0251 04/11/20 0302 04/12/20 0227 04/14/20 0130 04/15/20 0215  WBC 18.4* 17.4* 18.5* 15.0* 12.9*  HGB 7.5* 7.7* 8.3* 8.0* 8.2*  HCT 24.4* 24.7* 27.2* 26.0* 26.3*  PLT 387 369 366 330 381  MCV 94.6 94.6 95.4 94.9 97.0  MCH 29.1 29.5 29.1 29.2 30.3  MCHC 30.7 31.2 30.5 30.8 31.2  RDW 16.3* 16.3* 16.1* 17.0* 17.4*  LYMPHSABS 2.4 2.5 2.7 2.8 2.2  MONOABS 0.9 0.9 0.9 0.6 0.6  EOSABS 0.5 0.5 0.3 0.5 0.4  BASOSABS 0.1 0.1 0.1 0.1 0.1    Chemistries  Recent Labs  Lab 04/10/20 0251 04/11/20 0302 04/12/20 0227 04/14/20 0130 04/15/20 0215  NA 149* 151* 153* 142 143  K 3.7 3.5 3.3* 3.5 4.2  CL 112* 114* 115* 107 109  CO2 GLUCOSE 181* 197* 187* 165* 179*  BUN 38* 38* 34* 35* 36*  CREATININE 1.02* 1.08* 0.92 0.70 0.72  CALCIUM 9.3 9.0 9.2 8.5* 8.7*   ------------------------------------------------------------------------------------------------------------------ No results for input(s): CHOL, HDL, LDLCALC, TRIG, CHOLHDL, LDLDIRECT in the last 72 hours.  Lab Results  Component Value Date   HGBA1C 6.9 (H) 02/15/2020   ------------------------------------------------------------------------------------------------------------------ No results for input(s): TSH, T4TOTAL, T3FREE, THYROIDAB in the last 72 hours.  Invalid input(s): FREET3 ------------------------------------------------------------------------------------------------------------------ No results for input(s): VITAMINB12, FOLATE, FERRITIN, TIBC, IRON, RETICCTPCT in the last 72 hours.  Coagulation profile No results for input(s): INR, PROTIME in the last 168 hours.  No results for input(s): DDIMER in the last 72 hours.  Cardiac  Enzymes No results for input(s): CKMB, TROPONINI, MYOGLOBIN in the last 168 hours.  Invalid input(s): CK ------------------------------------------------------------------------------------------------------------------ No results found for: BNP  Micro Results No results found for this or any previous visit (from the past 240 hour(s)).  Radiology  Reports CT ABDOMEN PELVIS WO CONTRAST  Result Date: 04/08/2020 CLINICAL DATA:  Fever, leukocytosis. Pneumonia, effusion or abscess suspected. EXAM: CT CHEST, ABDOMEN AND PELVIS WITHOUT CONTRAST TECHNIQUE: Multidetector CT imaging of the chest, abdomen and pelvis was performed following the standard protocol without IV contrast. COMPARISON:  CT abdomen dated 03/08/2020. FINDINGS: CT CHEST FINDINGS Cardiovascular: No thoracic aortic aneurysm. No pericardial effusion. Thoracic aortic atherosclerosis. Coronary artery calcifications, particularly dense within the LEFT anterior descending coronary artery. Mediastinum/Nodes: No mass or enlarged lymph nodes are seen within the mediastinum. Esophagus is unremarkable. Trachea appears normal. Lungs/Pleura: Dense consolidations at the bilateral lung bases, RIGHT greater than LEFT. Additional patchy ground-glass opacities within the peripheral portions of each lung, most prominent within the upper lobes and LEFT lower lobe. No pleural effusion is seen. No pneumothorax. Musculoskeletal: No acute or suspicious osseous finding. Masslike density within the outer LEFT breast, of uncertain significance. CT ABDOMEN PELVIS FINDINGS Hepatobiliary: No focal liver abnormality. Gallbladder is unremarkable. No bile duct dilatation. Pancreas: Unremarkable. No pancreatic ductal dilatation or surrounding inflammatory changes. Spleen: Normal in size without focal abnormality. Adrenals/Urinary Tract: Adrenal glands appear normal. Kidneys are unremarkable without mass, stone or hydronephrosis. No perinephric fluid. Bladder is decompressed by  Foley catheter. Stomach/Bowel: No dilated large or small bowel loops. Fluid, and associated air-fluid levels scattered throughout the large and small bowel, suggesting ileus. Diverticulosis of the sigmoid and descending colon but no focal inflammatory change to suggest acute diverticulitis. Gastrostomy tube appears appropriately positioned. Vascular/Lymphatic: Aortic atherosclerosis. No enlarged lymph nodes seen. Reproductive: No adnexal mass or free fluid Other: No free fluid or abscess collection is seen within the abdomen or pelvis. No free intraperitoneal air. Musculoskeletal: No acute or suspicious osseous finding. Surgical changes of kyphoplasty/vertebroplasty at the L2 vertebral body level. IMPRESSION: 1. Dense consolidations at the bilateral lung bases, RIGHT greater than LEFT, consistent with multifocal pneumonia and/or aspiration pneumonitis. 2. Additional patchy ground-glass opacities within the peripheral portions of each lung, most prominent within the upper lobes and LEFT lower lobe. Differential includes atypical pneumonias such as viral or fungal, interstitial pneumonias, edema related to volume overload/CHF, chronic interstitial diseases, hypersensitivity pneumonitis, and respiratory bronchiolitis. COVID-19 pneumonia can have this appearance. 3. Masslike density within the outer LEFT breast, of uncertain significance, neoplastic mass not excluded. Recommend correlation with diagnostic mammogram at a dedicated breast center after current issues are resolved. 4. Colonic diverticulosis without evidence of acute diverticulitis. 5. Fluid, and associated air-fluid levels, scattered throughout the large and small bowel, suggesting ileus. No evidence of bowel obstruction. 6. No free fluid or abscess collection is seen within the abdomen or pelvis. No free intraperitoneal air. Aortic Atherosclerosis (ICD10-I70.0). Electronically Signed   By: Bary Richard M.D.   On: 04/08/2020 13:35   DG Abd 1  View  Result Date: 04/04/2020 CLINICAL DATA:  Abdominal distension, vomiting, fever EXAM: ABDOMEN - 1 VIEW COMPARISON:  03/19/2020 abdominal radiograph FINDINGS: Percutaneous gastrostomy tube terminates over the body of the stomach. Vertebroplasty changes again noted at L2. no disproportionately dilated small bowel loops. Moderate colorectal stool volume with dilation of the rectum to 8.9 cm diameter. No evidence of pneumatosis or pneumoperitoneum. Clear lung bases. IMPRESSION: Nonobstructive bowel gas pattern. Moderate colorectal stool volume, with dilation of the rectum to 8.9 cm, cannot exclude rectal fecal impaction. Electronically Signed   By: Delbert Phenix M.D.   On: 04/04/2020 12:56   CT CHEST WO CONTRAST  Result Date: 04/08/2020 CLINICAL DATA:  Fever, leukocytosis. Pneumonia, effusion or abscess suspected. EXAM:  CT CHEST, ABDOMEN AND PELVIS WITHOUT CONTRAST TECHNIQUE: Multidetector CT imaging of the chest, abdomen and pelvis was performed following the standard protocol without IV contrast. COMPARISON:  CT abdomen dated 03/08/2020. FINDINGS: CT CHEST FINDINGS Cardiovascular: No thoracic aortic aneurysm. No pericardial effusion. Thoracic aortic atherosclerosis. Coronary artery calcifications, particularly dense within the LEFT anterior descending coronary artery. Mediastinum/Nodes: No mass or enlarged lymph nodes are seen within the mediastinum. Esophagus is unremarkable. Trachea appears normal. Lungs/Pleura: Dense consolidations at the bilateral lung bases, RIGHT greater than LEFT. Additional patchy ground-glass opacities within the peripheral portions of each lung, most prominent within the upper lobes and LEFT lower lobe. No pleural effusion is seen. No pneumothorax. Musculoskeletal: No acute or suspicious osseous finding. Masslike density within the outer LEFT breast, of uncertain significance. CT ABDOMEN PELVIS FINDINGS Hepatobiliary: No focal liver abnormality. Gallbladder is unremarkable. No bile  duct dilatation. Pancreas: Unremarkable. No pancreatic ductal dilatation or surrounding inflammatory changes. Spleen: Normal in size without focal abnormality. Adrenals/Urinary Tract: Adrenal glands appear normal. Kidneys are unremarkable without mass, stone or hydronephrosis. No perinephric fluid. Bladder is decompressed by Foley catheter. Stomach/Bowel: No dilated large or small bowel loops. Fluid, and associated air-fluid levels scattered throughout the large and small bowel, suggesting ileus. Diverticulosis of the sigmoid and descending colon but no focal inflammatory change to suggest acute diverticulitis. Gastrostomy tube appears appropriately positioned. Vascular/Lymphatic: Aortic atherosclerosis. No enlarged lymph nodes seen. Reproductive: No adnexal mass or free fluid Other: No free fluid or abscess collection is seen within the abdomen or pelvis. No free intraperitoneal air. Musculoskeletal: No acute or suspicious osseous finding. Surgical changes of kyphoplasty/vertebroplasty at the L2 vertebral body level. IMPRESSION: 1. Dense consolidations at the bilateral lung bases, RIGHT greater than LEFT, consistent with multifocal pneumonia and/or aspiration pneumonitis. 2. Additional patchy ground-glass opacities within the peripheral portions of each lung, most prominent within the upper lobes and LEFT lower lobe. Differential includes atypical pneumonias such as viral or fungal, interstitial pneumonias, edema related to volume overload/CHF, chronic interstitial diseases, hypersensitivity pneumonitis, and respiratory bronchiolitis. COVID-19 pneumonia can have this appearance. 3. Masslike density within the outer LEFT breast, of uncertain significance, neoplastic mass not excluded. Recommend correlation with diagnostic mammogram at a dedicated breast center after current issues are resolved. 4. Colonic diverticulosis without evidence of acute diverticulitis. 5. Fluid, and associated air-fluid levels, scattered  throughout the large and small bowel, suggesting ileus. No evidence of bowel obstruction. 6. No free fluid or abscess collection is seen within the abdomen or pelvis. No free intraperitoneal air. Aortic Atherosclerosis (ICD10-I70.0). Electronically Signed   By: Bary Richard M.D.   On: 04/08/2020 13:35   IR GASTROSTOMY TUBE MOD SED  Result Date: 03/31/2020 INDICATION: 74 year old female with a history of dysphagia EXAM: PERC PLACEMENT GASTROSTOMY MEDICATIONS: Vancomycin 1 gm IV; Antibiotics were administered within 1 hour of the procedure. ANESTHESIA/SEDATION: Versed 0.5 mg IV; Fentanyl 25 mcg IV Moderate Sedation Time:  10 minutes The patient was continuously monitored during the procedure by the interventional radiology nurse under my direct supervision. CONTRAST:  51mL OMNIPAQUE IOHEXOL 300 MG/ML SOLN - administered into the gastric lumen. FLUOROSCOPY TIME:  Fluoroscopy Time: 2 minutes 54 seconds (6 mGy). COMPLICATIONS: None PROCEDURE: Informed written consent was obtained from the patient and the patient's family after a thorough discussion of the procedural risks, benefits and alternatives. All questions were addressed. Maximal Sterile Barrier Technique was utilized including caps, mask, sterile gowns, sterile gloves, sterile drape, hand hygiene and skin antiseptic. A timeout was performed prior to  the initiation of the procedure. The epigastrium was prepped with Betadine in a sterile fashion, and a sterile drape was applied covering the operative field. A sterile gown and sterile gloves were used for the procedure. A 5-French orogastric tube is placed under fluoroscopic guidance. Scout imaging of the abdomen confirms barium within the transverse colon. The stomach was distended with gas. Under fluoroscopic guidance, an 18 gauge needle was utilized to puncture the anterior wall of the body of the stomach. An Amplatz wire was advanced through the needle passing a T fastener into the lumen of the stomach. The  T fastener was secured for gastropexy. A 9-French sheath was inserted. A snare was advanced through the 9-French sheath. A Teena Dunk was advanced through the orogastric tube. It was snared then pulled out the oral cavity, pulling the snare, as well. The leading edge of the gastrostomy was attached to the snare. It was then pulled down the esophagus and out the percutaneous site. Tube secured in place. Contrast was injected. Patient tolerated the procedure well and remained hemodynamically stable throughout. No complications were encountered and no significant blood loss encountered. IMPRESSION: Status post fluoroscopic placed percutaneous gastrostomy tube, with 20 [place] pull-through. Signed, Yvone Neu. Loreta Ave, DO [facility] Radiology Electronically Signed   By: Gilmer Mor D.O.   On: 03/31/2020 12:59   DG CHEST PORT 1 VIEW  Result Date: 04/04/2020 CLINICAL DATA:  Fever and vomiting. EXAM: PORTABLE CHEST 1 VIEW COMPARISON:  03/28/2020 FINDINGS: Feeding tube has been removed. Stable elevation of the RIGHT hemidiaphragm. There is minimal subsegmental atelectasis at the LEFT lung base. No consolidations or pleural effusions. No pulmonary edema. IMPRESSION: Minimal LEFT lower lobe atelectasis. Electronically Signed   By: Norva Pavlov M.D.   On: 04/04/2020 12:34   DG CHEST PORT 1 VIEW  Result Date: 03/28/2020 CLINICAL DATA:  74 year old with fever and vomiting. EXAM: PORTABLE CHEST 1 VIEW COMPARISON:  03/09/2020 and earlier. FINDINGS: Cardiac silhouette normal in size, unchanged. Thoracic aorta tortuous and atherosclerotic, unchanged. Chronic elevation of the RIGHT hemidiaphragm with associated scar/atelectasis at the RIGHT lung base. Calcified RIGHT hilar lymph nodes again noted as is the calcified granuloma in the RIGHT lung apex. Lungs otherwise clear. No localized airspace consolidation. No pleural effusions. No pneumothorax. Normal pulmonary vascularity.  IMPRESSION: 1. No acute cardiopulmonary disease. 2. Chronic elevation of the RIGHT hemidiaphragm with associated scar/atelectasis at the RIGHT lung base. 3. Old granulomatous disease. Electronically Signed   By: Hulan Saas M.D.   On: 03/28/2020 15:20   DG Abd Portable 1V  Result Date: 03/19/2020 CLINICAL DATA:  Feeding tube placement EXAM: PORTABLE ABDOMEN - 1 VIEW COMPARISON:  03/12/2020 FINDINGS: Weighted enteric feeding tube tip terminates within the expected location of the distal stomach. Visualized bowel gas pattern is nonspecific. No gross free intraperitoneal air. Cement augmentation of L2. Healed posterior rib fractures involving the right tenth and eleventh ribs. IMPRESSION: Weighted enteric feeding tube tip terminates within the expected location of the distal stomach. Electronically Signed   By: Duanne Guess D.O.   On: 03/19/2020 14:36     Time Spent in minutes  30     Laverna Peace M.D on 04/15/2020 at 2:21 PM  To page go to [email or web address] - password [facility]

## 2020-04-16 LAB — GLUCOSE, CAPILLARY
Glucose-Capillary: 161 mg/dL — ABNORMAL HIGH (ref 70–99)
Glucose-Capillary: 173 mg/dL — ABNORMAL HIGH (ref 70–99)
Glucose-Capillary: 196 mg/dL — ABNORMAL HIGH (ref 70–99)
Glucose-Capillary: 222 mg/dL — ABNORMAL HIGH (ref 70–99)
Glucose-Capillary: 189 mg/dL — ABNORMAL HIGH (ref 70–99)
Glucose-Capillary: 190 mg/dL — ABNORMAL HIGH (ref 70–99)

## 2020-04-16 LAB — BASIC METABOLIC PANEL
Anion gap: 10 (ref 5–15)
BUN: 37 mg/dL — ABNORMAL HIGH (ref 8–23)
CO2: 26 mmol/L (ref 22–32)
Calcium: 9.1 mg/dL (ref 8.9–10.3)
Chloride: 109 mmol/L (ref 98–111)
Creatinine, Ser: 0.77 mg/dL (ref 0.44–1.00)
GFR calc Af Amer: >60 mL/min (ref >60)
GFR calc non Af Amer: >60 mL/min (ref >60)
Glucose, Bld: 156 mg/dL — ABNORMAL HIGH (ref 70–99)
Potassium: 4.4 mmol/L (ref 3.5–5.1)
Sodium: 145 mmol/L (ref 135–145)

## 2020-04-16 LAB — CBC WITH DIFFERENTIAL/PLATELET
Abs Immature Granulocytes: 0.34 10*3/uL — ABNORMAL HIGH (ref 0.00–0.07)
Basophils Absolute: 0.1 10*3/uL (ref 0.0–0.1)
Basophils Relative: 1 %
Eosinophils Absolute: 0.2 10*3/uL (ref 0.0–0.5)
Eosinophils Relative: 2 %
HCT: 28.8 % — ABNORMAL LOW (ref 36.0–46.0)
Hemoglobin: 8.8 g/dL — ABNORMAL LOW (ref 12.0–15.0)
Immature Granulocytes: 3 %
Lymphocytes Relative: 16 %
Lymphs Abs: 2.2 10*3/uL (ref 0.7–4.0)
MCH: 29.4 pg (ref 26.0–34.0)
MCHC: 30.6 g/dL (ref 30.0–36.0)
MCV: 96.3 fL (ref 80.0–100.0)
Monocytes Absolute: 0.6 10*3/uL (ref 0.1–1.0)
Monocytes Relative: 4 %
Neutro Abs: 10 10*3/uL — ABNORMAL HIGH (ref 1.7–7.7)
Neutrophils Relative %: 74 %
Platelets: 350 10*3/uL (ref 150–400)
RBC: 2.99 MIL/uL — ABNORMAL LOW (ref 3.87–5.11)
RDW: 18.4 % — ABNORMAL HIGH (ref 11.5–15.5)
WBC: 13.5 10*3/uL — ABNORMAL HIGH (ref 4.0–10.5)
nRBC: 0 % (ref 0.0–0.2)

## 2020-04-16 MED ORDER — GLUCERNA 1.5 CAL PO LIQD
1000.0000 mL | Freq: Once | ORAL | Status: AC
  Administered 2020-04-16: 1000 mL
  Filled 2020-04-16: qty 1000

## 2020-04-16 NOTE — Progress Notes (Signed)
[facility]  PROGRESS NOTE  Tasha [name] VHQ:[id number] DOB: 02/07/1946 DOA: 03/08/2020 PCP: Patient, No Pcp Per Admit date - 03/08/2020   Admitting Physician A Grier Mitts., MD  Outpatient Primary MD for the patient is Patient, No Pcp Per  LOS - 39 Brief Narrative   Tasha [name] is a 74 y.o. year old female with medical history significant for type 2 diabetes, HTN, MI, recent CVA (01/2020) who presented on 03/08/2020 with changes in mental status noted at her facility, intractable nausea vomiting and was found to initially have AKI with lactic acidosis that improved with supportive care.  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  Hospital course complicated by severe dysphagia requiring initiation of tube feeds followed by PEG tube placement on 8/4 as well as empiric Zosyn for aspiration pneumonia. .  Palliative care was consulted to assist in goals of care discussion due to patient's poor prognosis in meaningful recovery  Subjective  No family at bedside today.  Patient opens eyes to voice, seems to be able to follow simple one-step commands (stick tongue out on command)) A & P   Acute combined toxic/metabolic encephalopathy, multifactorial etiology including acute CVA, hypernatremia (currently resolved), hospital-acquired delirium.  EEG unrevealing, B12/TSH within normal limits.  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  Based off MRI on 7/18.  In setting of recent CVA on 6/21.  Completed DAPT for 3 weeks.  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  Peak of 153, now down to 142 with IV fluids and free water.  Expect this to continue to fluctuate given patient unable to tolerate p.o. -Monitor BMP, not currently requiring fluids  HTN, stable -Continue amlodipine 10 mg, metoprolol 25 mg twice daily  AKI, resolved with IV fluids.  Suspect prerenal on admission.  Nephrology no longer following.  Type 2 diabetes, A1c 6.9, controlled -Holding home Metformin -Continue to monitor CBGs, Levemir 10 units twice daily,/as needed   Acute urinary retention, resolved with Foley catheter placement 8/9 -Monitor output  Severe constipation -Fecal impaction on abdominal x-ray on 8/8, improved with optimization of bowel regimen -continue MiraLAX  Depression -Discontinued low-dose Remeron due to altered mentation  Left breast mass, seen on CT chest on 8/12 of uncertain significance -Pending goals of care discussion would need diagnostic mammogram as outpatient  Goals of care discussion.  Previous providers have had discussion with patient's son during her hospitalization and her poor prognosis.  I spoke with daughter [name] 08/18 and son by phone on 8/18, still considering when they would like to pursue comfort care measures -Appreciate palliative care assistance      Family Communication  : Spoke with daughter [name] at bedside on 8/18, spoke with son by phone on 8/18  Code Status :  Full code  Disposition Plan  :  Patient is from home. Anticipated d/c date:  Greater than 3 days barriers to d/c  or necessity for inpatient status:  Continue goals of care discussions with family, close monitoring of mental status for any improvement Consults  : Palliative  Procedures  :    DVT Prophylaxis  : Plavix  Lab Results  Component Value Date   PLT 350 04/16/2020    Diet :  Diet Order            Diet NPO time specified  Diet effective midnight                  Inpatient Medications Scheduled Meds: . amLODipine  10 mg Per Tube Daily  . chlorhexidine  15 mL Mouth Rinse BID  . Chlorhexidine Gluconate Cloth  6 each Topical Daily  . clopidogrel  75 mg Per Tube Daily  . feeding supplement (GLUCERNA 1.5 CAL)  960 mL Per Tube Q24H  . feeding supplement (PROSource TF)  45 mL Per Tube Daily  . heparin injection (subcutaneous)  5,000 Units Subcutaneous Q8H  . insulin aspart  0-9 Units Subcutaneous Q4H  . insulin detemir  10 Units Subcutaneous BID  . mouth rinse  15 mL Mouth Rinse BID  . metoprolol tartrate  25 mg Per Tube BID  . pantoprazole sodium  40 mg Per Tube Daily  . rosuvastatin  5 mg Per Tube Daily   Continuous Infusions: PRN Meds:.acetaminophen **OR** [DISCONTINUED] acetaminophen, lip balm, polyethylene glycol, Resource ThickenUp Clear  Antibiotics  :   Anti-infectives (From admission, onward)   Start     Dose/Rate Route Frequency Ordered Stop   04/05/20 1800  vancomycin (VANCOREADY) IVPB 750 mg/150 mL  Status:  Discontinued        750 mg 150 mL/hr over 60 Minutes Intravenous Every 24 hours 04/04/20 1727 04/07/20 0805   04/04/20 1730  vancomycin (VANCOREADY) IVPB 1250 mg/250 mL        1,250 mg 166.7 mL/hr over 90 Minutes Intravenous  Once 04/04/20 1727 04/04/20 1948   04/04/20 1730  piperacillin-tazobactam (ZOSYN) IVPB 3.375 g  Status:  Discontinued        3.375 g 12.5 mL/hr over 240 Minutes Intravenous Every 8 hours 04/04/20 1727 04/12/20 1003   04/04/20 1715  piperacillin-tazobactam (ZOSYN) IVPB 3.375 g  Status:  Discontinued       Note to Pharmacy: Adjust as per  renal function   3.375 g 100 mL/hr over 30 Minutes Intravenous Every 8 hours 04/04/20 1706 04/04/20 1726   03/31/20 1207  vancomycin (VANCOCIN) 1-5 GM/200ML-% IVPB       Note to Pharmacy: Erie Noe   : cabinet override      03/31/20 1207 03/31/20 1232   03/31/20 1200  vancomycin (VANCOCIN) IVPB 1000 mg/200 mL premix        1,000 mg 200 mL/hr over 60 Minutes Intravenous  Once 03/31/20 1155 03/31/20 1509   03/10/20 1200  cefTRIAXone (ROCEPHIN) 2 g in sodium chloride 0.9 % 100 mL IVPB        2 g 200 mL/hr over 30 Minutes Intravenous Every 24 hours 03/10/20 0924 03/14/20 1333   03/09/20 1000  ceFEPIme (MAXIPIME) 1 g in sodium chloride 0.9 % 100 mL IVPB  Status:  Discontinued        1 g 200 mL/hr over 30 Minutes Intravenous Every 24 hours 03/08/20 1219 03/10/20 0924   03/08/20 1219  vancomycin variable dose per unstable renal function (  pharmacist dosing)  Status:  Discontinued         Does not apply See admin instructions 03/08/20 1219 03/10/20 0906   03/08/20 1100  vancomycin (VANCOCIN) IVPB 1000 mg/200 mL premix        1,000 mg 200 mL/hr over 60 Minutes Intravenous  Once 03/08/20 1046 03/08/20 1406   03/08/20 1100  ceFEPIme (MAXIPIME) 2 g in sodium chloride 0.9 % 100 mL IVPB        2 g 200 mL/hr over 30 Minutes Intravenous  Once 03/08/20 1046 03/08/20 1211       Objective   Vitals:   04/15/20 2314 04/16/20 0303 04/16/20 0748 04/16/20 1127  BP: (!) 146/70 (!) 150/70 (!) 143/64 (!) 164/78  Pulse: 72 72 84 75  Resp: 18 16 18 18   Temp: 98.8 F (37.1 C) 98.5 F (36.9 C) 99 F (37.2 C) 98.5 F (36.9 C)  TempSrc: Oral Oral Oral Oral  SpO2: 99% 100% 100% 99%  Weight:      Height:        SpO2: 99 % O2 Flow Rate (L/min): 2 L/min  Wt Readings from Last 3 Encounters:  04/13/20 56.7 kg  02/24/20 62.1 kg  02/20/20 66.8 kg     Intake/Output Summary (Last 24 hours) at 04/16/2020 1619 Last data filed at 04/16/2020 1500 Gross per 24 hour  Intake 1420 ml  Output 1250 ml  Net  170 ml    Physical Exam:    Lying in bed comfortably, opens eyes to voice, sticks tongue out on command, otherwise not able to move any of her extremities Normal respiratory effort on 2 L, lungs clear bilaterally Abdomen soft, nondistended No peripheral edema Regular rate and rhythm   I have personally reviewed the following:   Data Reviewed:  CBC Recent Labs  Lab 04/11/20 0302 04/12/20 0227 04/14/20 0130 04/15/20 0215 04/16/20 0140  WBC 17.4* 18.5* 15.0* 12.9* 13.5*  HGB 7.7* 8.3* 8.0* 8.2* 8.8*  HCT 24.7* 27.2* 26.0* 26.3* 28.8*  PLT 369 366 330 381 350  MCV 94.6 95.4 94.9 97.0 96.3  MCH 29.5 29.1 29.2 30.3 29.4  MCHC 31.2 30.5 30.8 31.2 30.6  RDW 16.3* 16.1* 17.0* 17.4* 18.4*  LYMPHSABS 2.5 2.7 2.8 2.2 2.2  MONOABS 0.9 0.9 0.6 0.6 0.6  EOSABS 0.5 0.3 0.5 0.4 0.2  BASOSABS 0.1 0.1 0.1 0.1 0.1    Chemistries  Recent Labs  Lab 04/11/20 0302 04/12/20 0227 04/14/20 0130 04/15/20 0215 04/16/20 0140  NA 151* 153* 142 143 145  K 3.5 3.3* 3.5 4.2 4.4  CL 114* 115* 107 109 109  CO2 24 26 25 24 26   GLUCOSE 197* 187* 165* 179* 156*  BUN 38* 34* 35* 36* 37*  CREATININE 1.08* 0.92 0.70 0.72 0.77  CALCIUM 9.0 9.2 8.5* 8.7* 9.1   ------------------------------------------------------------------------------------------------------------------ No results for input(s): CHOL, HDL, LDLCALC, TRIG, CHOLHDL, LDLDIRECT in the last 72 hours.  Lab Results  Component Value Date   HGBA1C 6.9 (H) 02/15/2020   ------------------------------------------------------------------------------------------------------------------ No results for input(s): TSH, T4TOTAL, T3FREE, THYROIDAB in the last 72 hours.  Invalid input(s): FREET3 ------------------------------------------------------------------------------------------------------------------ No results for input(s): VITAMINB12, FOLATE, FERRITIN, TIBC, IRON, RETICCTPCT in the last 72 hours.  Coagulation profile No results for  input(s): INR, PROTIME in the last 168 hours.  No results for input(s): DDIMER in the last 72 hours.  Cardiac Enzymes No results for input(s): CKMB, TROPONINI, MYOGLOBIN in the last 168 hours.  Invalid input(s): CK ------------------------------------------------------------------------------------------------------------------ No results found for: BNP  Micro Results No results found for this or any previous visit (from the past 240 hour(s)).  Radiology Reports CT ABDOMEN PELVIS WO CONTRAST  Result Date: 04/08/2020 CLINICAL DATA:  Fever, leukocytosis. Pneumonia, effusion or abscess suspected. EXAM: CT CHEST, ABDOMEN AND PELVIS WITHOUT CONTRAST TECHNIQUE: Multidetector CT imaging of the chest, abdomen and pelvis was performed following the standard protocol without IV contrast. COMPARISON:  CT abdomen dated 03/08/2020. FINDINGS: CT CHEST FINDINGS Cardiovascular: No thoracic aortic aneurysm. No pericardial effusion. Thoracic aortic atherosclerosis. Coronary artery calcifications, particularly dense within the LEFT anterior descending coronary artery. Mediastinum/Nodes: No mass or enlarged lymph nodes are seen within the mediastinum. Esophagus is unremarkable. Trachea appears normal. Lungs/Pleura: Dense consolidations at the bilateral lung bases, RIGHT greater than LEFT. Additional patchy ground-glass opacities within the peripheral portions of each lung, most prominent within the upper lobes and LEFT lower lobe. No pleural effusion is seen. No pneumothorax. Musculoskeletal: No acute or suspicious osseous finding. Masslike density within the outer LEFT breast, of uncertain significance. CT ABDOMEN PELVIS FINDINGS Hepatobiliary: No focal liver abnormality. Gallbladder is unremarkable. No bile duct dilatation. Pancreas: Unremarkable. No pancreatic ductal dilatation or surrounding inflammatory changes. Spleen: Normal in size without focal abnormality. Adrenals/Urinary Tract: Adrenal glands appear normal.  Kidneys are unremarkable without mass, stone or hydronephrosis. No perinephric fluid. Bladder is decompressed by Foley catheter. Stomach/Bowel: No dilated large or small bowel loops. Fluid, and associated air-fluid levels scattered throughout the large and small bowel, suggesting ileus. Diverticulosis of the sigmoid and descending colon but no focal inflammatory change to suggest acute diverticulitis. Gastrostomy tube appears appropriately positioned. Vascular/Lymphatic: Aortic atherosclerosis. No enlarged lymph nodes seen. Reproductive: No adnexal mass or free fluid Other: No free fluid or abscess collection is seen within the abdomen or pelvis. No free intraperitoneal air. Musculoskeletal: No acute or suspicious osseous finding. Surgical changes of kyphoplasty/vertebroplasty at the L2 vertebral body level. IMPRESSION: 1. Dense consolidations at the bilateral lung bases, RIGHT greater than LEFT, consistent with multifocal pneumonia and/or aspiration pneumonitis. 2. Additional patchy ground-glass opacities within the peripheral portions of each lung, most prominent within the upper lobes and LEFT lower lobe. Differential includes atypical pneumonias such as viral or fungal, interstitial pneumonias, edema related to volume overload/CHF, chronic interstitial diseases, hypersensitivity pneumonitis, and respiratory bronchiolitis. COVID-19 pneumonia can have this appearance. 3. Masslike density within the outer LEFT breast, of uncertain significance, neoplastic mass not excluded. Recommend correlation with diagnostic mammogram at a dedicated breast center after current issues are resolved. 4. Colonic diverticulosis without evidence of acute diverticulitis. 5. Fluid, and associated air-fluid levels, scattered throughout the large and small bowel, suggesting ileus. No evidence of bowel obstruction. 6. No free fluid or abscess collection is seen within the abdomen or pelvis. No free intraperitoneal air. Aortic  Atherosclerosis (ICD10-I70.0). Electronically Signed   By: Bary Richard M.D.   On: 04/08/2020 13:35   DG Abd 1 View  Result Date: 04/04/2020 CLINICAL DATA:  Abdominal distension, vomiting, fever EXAM: ABDOMEN - 1 VIEW COMPARISON:  03/19/2020 abdominal radiograph FINDINGS: Percutaneous gastrostomy tube terminates over the body of the stomach. Vertebroplasty changes again noted at L2. no disproportionately dilated small bowel loops. Moderate colorectal stool volume with dilation of the rectum to 8.9 cm diameter. No evidence of pneumatosis or pneumoperitoneum. Clear lung bases. IMPRESSION: Nonobstructive bowel gas pattern. Moderate colorectal stool volume, with dilation of the rectum to 8.9 cm, cannot exclude rectal fecal impaction. Electronically Signed   By: Delbert Phenix M.D.   On: 04/04/2020 12:56   CT  CHEST WO CONTRAST  Result Date: 04/08/2020 CLINICAL DATA:  Fever, leukocytosis. Pneumonia, effusion or abscess suspected. EXAM: CT CHEST, ABDOMEN AND PELVIS WITHOUT CONTRAST TECHNIQUE: Multidetector CT imaging of the chest, abdomen and pelvis was performed following the standard protocol without IV contrast. COMPARISON:  CT abdomen dated 03/08/2020. FINDINGS: CT CHEST FINDINGS Cardiovascular: No thoracic aortic aneurysm. No pericardial effusion. Thoracic aortic atherosclerosis. Coronary artery calcifications, particularly dense within the LEFT anterior descending coronary artery. Mediastinum/Nodes: No mass or enlarged lymph nodes are seen within the mediastinum. Esophagus is unremarkable. Trachea appears normal. Lungs/Pleura: Dense consolidations at the bilateral lung bases, RIGHT greater than LEFT. Additional patchy ground-glass opacities within the peripheral portions of each lung, most prominent within the upper lobes and LEFT lower lobe. No pleural effusion is seen. No pneumothorax. Musculoskeletal: No acute or suspicious osseous finding. Masslike density within the outer LEFT breast, of uncertain  significance. CT ABDOMEN PELVIS FINDINGS Hepatobiliary: No focal liver abnormality. Gallbladder is unremarkable. No bile duct dilatation. Pancreas: Unremarkable. No pancreatic ductal dilatation or surrounding inflammatory changes. Spleen: Normal in size without focal abnormality. Adrenals/Urinary Tract: Adrenal glands appear normal. Kidneys are unremarkable without mass, stone or hydronephrosis. No perinephric fluid. Bladder is decompressed by Foley catheter. Stomach/Bowel: No dilated large or small bowel loops. Fluid, and associated air-fluid levels scattered throughout the large and small bowel, suggesting ileus. Diverticulosis of the sigmoid and descending colon but no focal inflammatory change to suggest acute diverticulitis. Gastrostomy tube appears appropriately positioned. Vascular/Lymphatic: Aortic atherosclerosis. No enlarged lymph nodes seen. Reproductive: No adnexal mass or free fluid Other: No free fluid or abscess collection is seen within the abdomen or pelvis. No free intraperitoneal air. Musculoskeletal: No acute or suspicious osseous finding. Surgical changes of kyphoplasty/vertebroplasty at the L2 vertebral body level. IMPRESSION: 1. Dense consolidations at the bilateral lung bases, RIGHT greater than LEFT, consistent with multifocal pneumonia and/or aspiration pneumonitis. 2. Additional patchy ground-glass opacities within the peripheral portions of each lung, most prominent within the upper lobes and LEFT lower lobe. Differential includes atypical pneumonias such as viral or fungal, interstitial pneumonias, edema related to volume overload/CHF, chronic interstitial diseases, hypersensitivity pneumonitis, and respiratory bronchiolitis. COVID-19 pneumonia can have this appearance. 3. Masslike density within the outer LEFT breast, of uncertain significance, neoplastic mass not excluded. Recommend correlation with diagnostic mammogram at a dedicated breast center after current issues are resolved. 4.  Colonic diverticulosis without evidence of acute diverticulitis. 5. Fluid, and associated air-fluid levels, scattered throughout the large and small bowel, suggesting ileus. No evidence of bowel obstruction. 6. No free fluid or abscess collection is seen within the abdomen or pelvis. No free intraperitoneal air. Aortic Atherosclerosis (ICD10-I70.0). Electronically Signed   By: Bary Richard M.D.   On: 04/08/2020 13:35   IR GASTROSTOMY TUBE MOD SED  Result Date: 03/31/2020 INDICATION: 74 year old female with a history of dysphagia EXAM: PERC PLACEMENT GASTROSTOMY MEDICATIONS: Vancomycin 1 gm IV; Antibiotics were administered within 1 hour of the procedure. ANESTHESIA/SEDATION: Versed 0.5 mg IV; Fentanyl 25 mcg IV Moderate Sedation Time:  10 minutes The patient was continuously monitored during the procedure by the interventional radiology nurse under my direct supervision. CONTRAST:  42mL OMNIPAQUE IOHEXOL 300 MG/ML SOLN - administered into the gastric lumen. FLUOROSCOPY TIME:  Fluoroscopy Time: 2 minutes 54 seconds (6 mGy). COMPLICATIONS: None PROCEDURE: Informed written consent was obtained from the patient and the patient's family after a thorough discussion of the procedural risks, benefits and alternatives. All questions were addressed. Maximal Sterile Barrier Technique was utilized including caps,  mask, sterile gowns, sterile gloves, sterile drape, hand hygiene and skin antiseptic. A timeout was performed prior to the initiation of the procedure. The epigastrium was prepped with Betadine in a sterile fashion, and a sterile drape was applied covering the operative field. A sterile gown and sterile gloves were used for the procedure. A 5-French orogastric tube is placed under fluoroscopic guidance. Scout imaging of the abdomen confirms barium within the transverse colon. The stomach was distended with gas. Under fluoroscopic guidance, an 18 gauge needle was utilized to puncture the anterior wall of the body of  the stomach. An Amplatz wire was advanced through the needle passing a T fastener into the lumen of the stomach. The T fastener was secured for gastropexy. A 9-French sheath was inserted. A snare was advanced through the 9-French sheath. A Teena Dunk was advanced through the orogastric tube. It was snared then pulled out the oral cavity, pulling the snare, as well. The leading edge of the gastrostomy was attached to the snare. It was then pulled down the esophagus and out the percutaneous site. Tube secured in place. Contrast was injected. Patient tolerated the procedure well and remained hemodynamically stable throughout. No complications were encountered and no significant blood loss encountered. IMPRESSION: Status post fluoroscopic placed percutaneous gastrostomy tube, with 20 [place] pull-through. Signed, Yvone Neu. Loreta Ave, DO [facility] Electronically Signed   By: Gilmer Mor D.O.   On: 03/31/2020 12:59   DG CHEST PORT 1 VIEW  Result Date: 04/04/2020 CLINICAL DATA:  Fever and vomiting. EXAM: PORTABLE CHEST 1 VIEW COMPARISON:  03/28/2020 FINDINGS: Feeding tube has been removed. Stable elevation of the RIGHT hemidiaphragm. There is minimal subsegmental atelectasis at the LEFT lung base. No consolidations or pleural effusions. No pulmonary edema. IMPRESSION: Minimal LEFT lower lobe atelectasis. Electronically Signed   By: Norva Pavlov M.D.   On: 04/04/2020 12:34   DG CHEST PORT 1 VIEW  Result Date: 03/28/2020 CLINICAL DATA:  74 year old with fever and vomiting. EXAM: PORTABLE CHEST 1 VIEW COMPARISON:  03/09/2020 and earlier. FINDINGS: Cardiac silhouette normal in size, unchanged. Thoracic aorta tortuous and atherosclerotic, unchanged. Chronic elevation of the RIGHT hemidiaphragm with associated scar/atelectasis at the RIGHT lung base. Calcified RIGHT hilar lymph nodes again noted as is the calcified granuloma in the RIGHT lung apex. Lungs otherwise  clear. No localized airspace consolidation. No pleural effusions. No pneumothorax. Normal pulmonary vascularity. IMPRESSION: 1. No acute cardiopulmonary disease. 2. Chronic elevation of the RIGHT hemidiaphragm with associated scar/atelectasis at the RIGHT lung base. 3. Old granulomatous disease. Electronically Signed   By: Hulan Saas M.D.   On: 03/28/2020 15:20   DG Abd Portable 1V  Result Date: 03/19/2020 CLINICAL DATA:  Feeding tube placement EXAM: PORTABLE ABDOMEN - 1 VIEW COMPARISON:  03/12/2020 FINDINGS: Weighted enteric feeding tube tip terminates within the expected location of the distal stomach. Visualized bowel gas pattern is nonspecific. No gross free intraperitoneal air. Cement augmentation of L2. Healed posterior rib fractures involving the right tenth and eleventh ribs. IMPRESSION: Weighted enteric feeding tube tip terminates within the expected location of the distal stomach. Electronically Signed   By: Duanne Guess D.O.   On: 03/19/2020 14:36     Time Spent in minutes  30     Laverna Peace M.D on 04/16/2020 at 4:19 PM  To page go to [email or web address] - password [email or web address]

## 2020-04-16 NOTE — Plan of Care (Signed)
  Problem: Safety: Goal: Ability to remain free from injury will improve Outcome: Progressing   

## 2020-04-16 NOTE — Progress Notes (Signed)
Occupational Therapy Treatment Patient Details Name: Tasha [name] MRN: [id number] DOB: [birth year] Today's Date: 04/16/2020    History of present illness Tasha [name] is a 74 y.o. 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 In hospital, R sided weakness not improving, MRI showed new acute infarcts of L pontine and post body R lateral ventricle. Pt had Gtube placed on 04/01/2020   OT comments  Patient continues to make minimal progress towards goals in skilled OT session. Patient's session encompassed bed mobility exercises and attempts to engage pt in verbal commands. Pt noted to be increasingly alert with therapist to date, responding to name appropriately 1x, finding therapist with her eyes x2, and nodding appropriately x1 when prompted if she wanted the therapist to turn TV on at end of the session. However, it is important to not that responses remain sporadic and inconsistent in presentation. Therapist able to complete PROM to extremities to prevent further contractures, and repositioned/turned pt at end of session as pt remains a plus 2 total for all bed mobility. Discharge remains appropriate; will continue to follow acutely.    Follow Up Recommendations  SNF    Equipment Recommendations  None recommended by OT    Recommendations for Other Services      Precautions / Restrictions Precautions Precautions: Fall Precaution Comments: right hemiplegia, PEG Required Braces or Orthoses: Splint/Cast Splint/Cast: bil resting hand splints- night wear only Restrictions Weight Bearing Restrictions: No       Mobility Bed Mobility Overal bed mobility: Needs Assistance Bed Mobility: Rolling Rolling: Total assist;+2 for physical assistance;+2 for safety/equipment         General bed mobility comments: Total assist to roll for repositioning in bed for pressure  relief.   Transfers                 General transfer comment: unable to participate; needs lift    Balance                                           ADL either performed or assessed with clinical judgement   ADL Overall ADL's : Needs assistance/impaired                                     Functional mobility during ADLs: Total assistance;+2 for physical assistance;+2 for safety/equipment General ADL Comments: Pt remains total A for all care     Vision       Perception     Praxis      Cognition Arousal/Alertness: Awake/alert Behavior During Therapy: Flat affect Overall Cognitive Status: Difficult to assess                                 General Comments: pt minimally responsive in session, able to look and respond at therapist less than 50% of the time, however held gaze with therapist more than in previous sessions, and nodded once when asked if she wanted the tv on at the end of the session        Exercises General Exercises - Upper Extremity Shoulder Flexion: PROM;Supine;Both;10 reps Shoulder Extension: PROM;Both;Supine;10 reps Elbow Flexion: PROM;Both;Supine;10 reps  Elbow Extension: PROM;Both;Supine;10 reps Wrist Flexion: PROM;Both;Supine;10 reps Wrist Extension: PROM;Both;Supine;10 reps Digit Composite Flexion: PROM;Both;Supine;10 reps Composite Extension: PROM;Both;Supine;10 reps General Exercises - Lower Extremity Ankle Circles/Pumps: Both;10 reps;Supine;PROM Other Exercises Other Exercises: 5x Lt cervical rotation with gentle stretch Other Exercises: Gentle retrograde edema massage to Bil UE's (3 mins each)   Shoulder Instructions       General Comments      Pertinent Vitals/ Pain       Pain Assessment: Faces Faces Pain Scale: Hurts a little bit Pain Location: grimacing with LUE PROM Pain Descriptors / Indicators: Grimacing Pain Intervention(s): Limited activity within patient's  tolerance;Monitored during session;Repositioned  Home Living                                          Prior Functioning/Environment              Frequency  Min 2X/week        Progress Toward Goals  OT Goals(current goals can now be found in the care plan section)  Progress towards OT goals: Progressing toward goals  Acute Rehab OT Goals Patient Stated Goal: Unable to state OT Goal Formulation: Patient unable to participate in goal setting Time For Goal Achievement: 04/23/20 Potential to Achieve Goals: Poor  Plan Discharge plan remains appropriate;Frequency remains appropriate    Co-evaluation                 AM-PAC OT "6 Clicks" Daily Activity     Outcome Measure   Help from another person eating meals?: Total Help from another person taking care of personal grooming?: Total Help from another person toileting, which includes using toliet, bedpan, or urinal?: Total Help from another person bathing (including washing, rinsing, drying)?: Total Help from another person to put on and taking off regular upper body clothing?: Total Help from another person to put on and taking off regular lower body clothing?: Total 6 Click Score: 6    End of Session    OT Visit Diagnosis: Unsteadiness on feet (R26.81);History of falling (Z91.81);Muscle weakness (generalized) (M62.81);Other symptoms and signs involving the nervous system (R29.898);Other symptoms and signs involving cognitive function;Cognitive communication deficit (R41.841);Other abnormalities of gait and mobility (R26.89)   Activity Tolerance Patient limited by fatigue   Patient Left in bed;with call bell/phone within reach;with bed alarm set   Nurse Communication Mobility status        Time: 0240-9735 OT Time Calculation (min): 12 min  Charges: OT General Charges $OT Visit: 1 Visit OT Treatments $Therapeutic Exercise: 8-22 mins  Pollyann Glen E. Riti Rollyson, COTA/L Acute Rehabilitation  Services [phone]   [name] 04/16/2020, 9:33 AM

## 2020-04-16 NOTE — Progress Notes (Signed)
Chotiner informed of pt's TF needing a 1x dose order for night shift. TF scanned at 1000 8/20, but complete at 2200 8/20 at rate of 40 cc/hr, which is not possible (12 hr time span).

## 2020-04-17 LAB — GLUCOSE, CAPILLARY
Glucose-Capillary: 162 mg/dL — ABNORMAL HIGH (ref 70–99)
Glucose-Capillary: 164 mg/dL — ABNORMAL HIGH (ref 70–99)
Glucose-Capillary: 165 mg/dL — ABNORMAL HIGH (ref 70–99)
Glucose-Capillary: 175 mg/dL — ABNORMAL HIGH (ref 70–99)
Glucose-Capillary: 195 mg/dL — ABNORMAL HIGH (ref 70–99)
Glucose-Capillary: 197 mg/dL — ABNORMAL HIGH (ref 70–99)
Glucose-Capillary: 203 mg/dL — ABNORMAL HIGH (ref 70–99)
Glucose-Capillary: 225 mg/dL — ABNORMAL HIGH (ref 70–99)

## 2020-04-17 NOTE — Plan of Care (Signed)
  Problem: Pain Managment: Goal: General experience of comfort will improve Outcome: Progressing   Problem: Safety: Goal: Ability to remain free from injury will improve Outcome: Progressing   Problem: Skin Integrity: Goal: Risk for impaired skin integrity will decrease Outcome: Progressing   

## 2020-04-17 NOTE — Progress Notes (Signed)
[facility]  PROGRESS NOTE  Tasha [name] QIH:[id number] DOB: 1945-09-24 DOA: 03/08/2020 PCP: Patient, No Pcp Per Admit date - 03/08/2020   Admitting Physician A Grier Mitts., MD  Outpatient Primary MD for the patient is Patient, No Pcp Per  LOS - 40 Brief Narrative   Tasha [name] is a 74 y.o. year old female with medical history significant for type 2 diabetes, HTN, MI, recent CVA (01/2020) who presented on 03/08/2020 with changes in mental status noted at her facility, intractable nausea vomiting and was found to initially have AKI with lactic acidosis that improved with supportive care.  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  Hospital course complicated by severe dysphagia requiring initiation of tube feeds followed by PEG tube placement on 8/4 as well as empiric Zosyn for aspiration pneumonia. .  Palliative care was consulted to assist in goals of care discussion due to patient's poor prognosis in meaningful recovery  Subjective  No family at bedside today.  No acute events overnight. Resting in bed A & P   Acute combined toxic/metabolic encephalopathy, multifactorial etiology including acute CVA, hypernatremia (currently resolved), hospital-acquired delirium.  EEG unrevealing, B12/TSH within normal limits.  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  Based off MRI on 7/18.  In setting of recent CVA on 6/21.  Completed DAPT for 3 weeks.  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  Peak of 153, now down to 142 with IV fluids and free water.  Expect this to continue to fluctuate given patient unable to tolerate p.o. -Monitor BMP, not currently requiring fluids  HTN, stable -Continue amlodipine 10 mg, metoprolol 25 mg twice daily  AKI, resolved with IV fluids.  Suspect prerenal on admission.  Nephrology no longer following.  Type 2 diabetes, A1c 6.9, controlled -Holding home Metformin -Continue to monitor CBGs, Levemir 10 units twice daily,/as needed   Acute urinary retention, resolved with Foley catheter placement 8/9 -Monitor output  Severe constipation -Fecal impaction on abdominal x-ray on 8/8, improved with optimization of bowel regimen -continue MiraLAX  Depression -Discontinued low-dose Remeron due to altered mentation  Left breast mass, seen on CT chest on 8/12 of uncertain significance -Pending goals of care discussion would need diagnostic mammogram as outpatient  Goals of care discussion.  Previous providers have had discussion with patient's son during her hospitalization and her poor prognosis.  I spoke with daughter [name] 08/18 and son by phone on 8/18, still considering when they would like to pursue comfort care measures -Appreciate palliative care assistance      Family Communication  : Spoke with daughter [name] at bedside on 8/18, spoke with son by phone on 8/18  Code Status : Full code  Disposition Plan  :  Patient is from  home. Anticipated d/c date:  Greater than 3 days barriers to d/c or necessity for inpatient status:  Continue goals of care discussions with  family, close monitoring of mental status for any improvement Consults  : Palliative  Procedures  :    DVT Prophylaxis  : Plavix  Lab Results  Component Value Date   PLT 350 04/16/2020    Diet :  Diet Order            Diet NPO time specified  Diet effective midnight                  Inpatient Medications Scheduled Meds: . amLODipine  10 mg Per Tube Daily  . chlorhexidine  15 mL Mouth Rinse BID  . Chlorhexidine Gluconate Cloth  6 each Topical Daily  . clopidogrel  75 mg Per Tube Daily  . feeding supplement (GLUCERNA 1.5 CAL)  960 mL Per Tube Q24H  . feeding supplement (PROSource TF)  45 mL Per Tube Daily  . heparin injection (subcutaneous)  5,000 Units Subcutaneous Q8H  . insulin aspart  0-9 Units Subcutaneous Q4H  . insulin detemir  10 Units Subcutaneous BID  . mouth rinse  15 mL Mouth Rinse BID  . metoprolol tartrate  25 mg Per Tube BID  . pantoprazole sodium  40 mg Per Tube Daily  . rosuvastatin  5 mg Per Tube Daily   Continuous Infusions: PRN Meds:.acetaminophen **OR** [DISCONTINUED] acetaminophen, lip balm, polyethylene glycol, Resource ThickenUp Clear  Antibiotics  :   Anti-infectives (From admission, onward)   Start     Dose/Rate Route Frequency Ordered Stop   04/05/20 1800  vancomycin (VANCOREADY) IVPB 750 mg/150 mL  Status:  Discontinued        750 mg 150 mL/hr over 60 Minutes Intravenous Every 24 hours 04/04/20 1727 04/07/20 0805   04/04/20 1730  vancomycin (VANCOREADY) IVPB 1250 mg/250 mL        1,250 mg 166.7 mL/hr over 90 Minutes Intravenous  Once 04/04/20 1727 04/04/20 1948   04/04/20 1730  piperacillin-tazobactam (ZOSYN) IVPB 3.375 g  Status:  Discontinued        3.375 g 12.5 mL/hr over 240 Minutes Intravenous Every 8 hours 04/04/20 1727 04/12/20 1003   04/04/20 1715  piperacillin-tazobactam (ZOSYN) IVPB 3.375 g  Status:  Discontinued       Note to Pharmacy: Adjust as per renal function   3.375 g 100 mL/hr over 30 Minutes Intravenous Every 8 hours  04/04/20 1706 04/04/20 1726   03/31/20 1207  vancomycin (VANCOCIN) 1-5 GM/200ML-% IVPB       Note to Pharmacy: Erie Noe   : cabinet override      03/31/20 1207 03/31/20 1232   03/31/20 1200  vancomycin (VANCOCIN) IVPB 1000 mg/200 mL premix        1,000 mg 200 mL/hr over 60 Minutes Intravenous  Once 03/31/20 1155 03/31/20 1509   03/10/20 1200  cefTRIAXone (ROCEPHIN) 2 g in sodium chloride 0.9 % 100 mL IVPB        2 g 200 mL/hr over 30 Minutes Intravenous Every 24 hours 03/10/20 0924 03/14/20 1333   03/09/20 1000  ceFEPIme (MAXIPIME) 1 g in sodium chloride 0.9 % 100 mL IVPB  Status:  Discontinued        1 g 200 mL/hr over 30 Minutes Intravenous Every 24 hours 03/08/20 1219 03/10/20 0924   03/08/20 1219  vancomycin variable dose per unstable renal function (pharmacist dosing)  Status:  Discontinued  Does not apply See admin instructions 03/08/20 1219 03/10/20 0906   03/08/20 1100  vancomycin (VANCOCIN) IVPB 1000 mg/200 mL premix        1,000 mg 200 mL/hr over 60 Minutes Intravenous  Once 03/08/20 1046 03/08/20 1406   03/08/20 1100  ceFEPIme (MAXIPIME) 2 g in sodium chloride 0.9 % 100 mL IVPB        2 g 200 mL/hr over 30 Minutes Intravenous  Once 03/08/20 1046 03/08/20 1211       Objective   Vitals:   04/17/20 0326 04/17/20 0339 04/17/20 0736 04/17/20 1144  BP: (!) 146/58  137/66 (!) 167/65  Pulse: 82  85 80  Resp: Temp: 97.6 F (36.4 C)  98.4 F (36.9 C) 98.3 F (36.8 C)  TempSrc: Oral  Oral Oral  SpO2: 100%  98% 99%  Weight:  37.6 kg    Height:        SpO2: 99 % O2 Flow Rate (L/min): 2 L/min  Wt Readings from Last 3 Encounters:  04/17/20 37.6 kg  02/24/20 62.1 kg  02/20/20 66.8 kg     Intake/Output Summary (Last 24 hours) at 04/17/2020 1348 Last data filed at 04/17/2020 1100 Gross per 24 hour  Intake 5761 ml  Output 550 ml  Net 5211 ml    Physical Exam:    Lying in bed comfortably,   not able to move any of her extremities Normal  respiratory effort on 2 L, lungs clear bilaterally, distant breath sounds Abdomen soft, nondistended No peripheral edema Regular rate and rhythm   I have personally reviewed the following:   Data Reviewed:  CBC Recent Labs  Lab 04/11/20 0302 04/12/20 0227 04/14/20 0130 04/15/20 0215 04/16/20 0140  WBC 17.4* 18.5* 15.0* 12.9* 13.5*  HGB 7.7* 8.3* 8.0* 8.2* 8.8*  HCT 24.7* 27.2* 26.0* 26.3* 28.8*  PLT 369 366 330 381 350  MCV 94.6 95.4 94.9 97.0 96.3  MCH 29.5 29.1 29.2 30.3 29.4  MCHC 31.2 30.5 30.8 31.2 30.6  RDW 16.3* 16.1* 17.0* 17.4* 18.4*  LYMPHSABS 2.5 2.7 2.8 2.2 2.2  MONOABS 0.9 0.9 0.6 0.6 0.6  EOSABS 0.5 0.3 0.5 0.4 0.2  BASOSABS 0.1 0.1 0.1 0.1 0.1    Chemistries  Recent Labs  Lab 04/11/20 0302 04/12/20 0227 04/14/20 0130 04/15/20 0215 04/16/20 0140  NA 151* 153* 142 143 145  K 3.5 3.3* 3.5 4.2 4.4  CL 114* 115* 107 109 109  CO2 GLUCOSE 197* 187* 165* 179* 156*  BUN 38* 34* 35* 36* 37*  CREATININE 1.08* 0.92 0.70 0.72 0.77  CALCIUM 9.0 9.2 8.5* 8.7* 9.1   ------------------------------------------------------------------------------------------------------------------ No results for input(s): CHOL, HDL, LDLCALC, TRIG, CHOLHDL, LDLDIRECT in the last 72 hours.  Lab Results  Component Value Date   HGBA1C 6.9 (H) 02/15/2020   ------------------------------------------------------------------------------------------------------------------ No results for input(s): TSH, T4TOTAL, T3FREE, THYROIDAB in the last 72 hours.  Invalid input(s): FREET3 ------------------------------------------------------------------------------------------------------------------ No results for input(s): VITAMINB12, FOLATE, FERRITIN, TIBC, IRON, RETICCTPCT in the last 72 hours.  Coagulation profile No results for input(s): INR, PROTIME in the last 168 hours.  No results for input(s): DDIMER in the last 72 hours.  Cardiac Enzymes No results for  input(s): CKMB, TROPONINI, MYOGLOBIN in the last 168 hours.  Invalid input(s): CK ------------------------------------------------------------------------------------------------------------------ No results found for: BNP  Micro Results No results found for this or any previous visit (from the past 240 hour(s)).  Radiology Reports CT ABDOMEN PELVIS WO  CONTRAST  Result Date: 04/08/2020 CLINICAL DATA:  Fever, leukocytosis. Pneumonia, effusion or abscess suspected. EXAM: CT CHEST, ABDOMEN AND PELVIS WITHOUT CONTRAST TECHNIQUE: Multidetector CT imaging of the chest, abdomen and pelvis was performed following the standard protocol without IV contrast. COMPARISON:  CT abdomen dated 03/08/2020. FINDINGS: CT CHEST FINDINGS Cardiovascular: No thoracic aortic aneurysm. No pericardial effusion. Thoracic aortic atherosclerosis. Coronary artery calcifications, particularly dense within the LEFT anterior descending coronary artery. Mediastinum/Nodes: No mass or enlarged lymph nodes are seen within the mediastinum. Esophagus is unremarkable. Trachea appears normal. Lungs/Pleura: Dense consolidations at the bilateral lung bases, RIGHT greater than LEFT. Additional patchy ground-glass opacities within the peripheral portions of each lung, most prominent within the upper lobes and LEFT lower lobe. No pleural effusion is seen. No pneumothorax. Musculoskeletal: No acute or suspicious osseous finding. Masslike density within the outer LEFT breast, of uncertain significance. CT ABDOMEN PELVIS FINDINGS Hepatobiliary: No focal liver abnormality. Gallbladder is unremarkable. No bile duct dilatation. Pancreas: Unremarkable. No pancreatic ductal dilatation or surrounding inflammatory changes. Spleen: Normal in size without focal abnormality. Adrenals/Urinary Tract: Adrenal glands appear normal. Kidneys are unremarkable without mass, stone or hydronephrosis. No perinephric fluid. Bladder is decompressed by Foley catheter.  Stomach/Bowel: No dilated large or small bowel loops. Fluid, and associated air-fluid levels scattered throughout the large and small bowel, suggesting ileus. Diverticulosis of the sigmoid and descending colon but no focal inflammatory change to suggest acute diverticulitis. Gastrostomy tube appears appropriately positioned. Vascular/Lymphatic: Aortic atherosclerosis. No enlarged lymph nodes seen. Reproductive: No adnexal mass or free fluid Other: No free fluid or abscess collection is seen within the abdomen or pelvis. No free intraperitoneal air. Musculoskeletal: No acute or suspicious osseous finding. Surgical changes of kyphoplasty/vertebroplasty at the L2 vertebral body level. IMPRESSION: 1. Dense consolidations at the bilateral lung bases, RIGHT greater than LEFT, consistent with multifocal pneumonia and/or aspiration pneumonitis. 2. Additional patchy ground-glass opacities within the peripheral portions of each lung, most prominent within the upper lobes and LEFT lower lobe. Differential includes atypical pneumonias such as viral or fungal, interstitial pneumonias, edema related to volume overload/CHF, chronic interstitial diseases, hypersensitivity pneumonitis, and respiratory bronchiolitis. COVID-19 pneumonia can have this appearance. 3. Masslike density within the outer LEFT breast, of uncertain significance, neoplastic mass not excluded. Recommend correlation with diagnostic mammogram at a dedicated breast center after current issues are resolved. 4. Colonic diverticulosis without evidence of acute diverticulitis. 5. Fluid, and associated air-fluid levels, scattered throughout the large and small bowel, suggesting ileus. No evidence of bowel obstruction. 6. No free fluid or abscess collection is seen within the abdomen or pelvis. No free intraperitoneal air. Aortic Atherosclerosis (ICD10-I70.0). Electronically Signed   By: Bary RichardStan  Maynard M.D.   On: 04/08/2020 13:35   DG Abd 1 View  Result Date:  04/04/2020 CLINICAL DATA:  Abdominal distension, vomiting, fever EXAM: ABDOMEN - 1 VIEW COMPARISON:  03/19/2020 abdominal radiograph FINDINGS: Percutaneous gastrostomy tube terminates over the body of the stomach. Vertebroplasty changes again noted at L2. no disproportionately dilated small bowel loops. Moderate colorectal stool volume with dilation of the rectum to 8.9 cm diameter. No evidence of pneumatosis or pneumoperitoneum. Clear lung bases. IMPRESSION: Nonobstructive bowel gas pattern. Moderate colorectal stool volume, with dilation of the rectum to 8.9 cm, cannot exclude rectal fecal impaction. Electronically Signed   By: Delbert PhenixJason A Poff M.D.   On: 04/04/2020 12:56   CT CHEST WO CONTRAST  Result Date: 04/08/2020 CLINICAL DATA:  Fever, leukocytosis. Pneumonia, effusion or abscess suspected. EXAM: CT CHEST, ABDOMEN AND PELVIS  WITHOUT CONTRAST TECHNIQUE: Multidetector CT imaging of the chest, abdomen and pelvis was performed following the standard protocol without IV contrast. COMPARISON:  CT abdomen dated 03/08/2020. FINDINGS: CT CHEST FINDINGS Cardiovascular: No thoracic aortic aneurysm. No pericardial effusion. Thoracic aortic atherosclerosis. Coronary artery calcifications, particularly dense within the LEFT anterior descending coronary artery. Mediastinum/Nodes: No mass or enlarged lymph nodes are seen within the mediastinum. Esophagus is unremarkable. Trachea appears normal. Lungs/Pleura: Dense consolidations at the bilateral lung bases, RIGHT greater than LEFT. Additional patchy ground-glass opacities within the peripheral portions of each lung, most prominent within the upper lobes and LEFT lower lobe. No pleural effusion is seen. No pneumothorax. Musculoskeletal: No acute or suspicious osseous finding. Masslike density within the outer LEFT breast, of uncertain significance. CT ABDOMEN PELVIS FINDINGS Hepatobiliary: No focal liver abnormality. Gallbladder is unremarkable. No bile duct dilatation.  Pancreas: Unremarkable. No pancreatic ductal dilatation or surrounding inflammatory changes. Spleen: Normal in size without focal abnormality. Adrenals/Urinary Tract: Adrenal glands appear normal. Kidneys are unremarkable without mass, stone or hydronephrosis. No perinephric fluid. Bladder is decompressed by Foley catheter. Stomach/Bowel: No dilated large or small bowel loops. Fluid, and associated air-fluid levels scattered throughout the large and small bowel, suggesting ileus. Diverticulosis of the sigmoid and descending colon but no focal inflammatory change to suggest acute diverticulitis. Gastrostomy tube appears appropriately positioned. Vascular/Lymphatic: Aortic atherosclerosis. No enlarged lymph nodes seen. Reproductive: No adnexal mass or free fluid Other: No free fluid or abscess collection is seen within the abdomen or pelvis. No free intraperitoneal air. Musculoskeletal: No acute or suspicious osseous finding. Surgical changes of kyphoplasty/vertebroplasty at the L2 vertebral body level. IMPRESSION: 1. Dense consolidations at the bilateral lung bases, RIGHT greater than LEFT, consistent with multifocal pneumonia and/or aspiration pneumonitis. 2. Additional patchy ground-glass opacities within the peripheral portions of each lung, most prominent within the upper lobes and LEFT lower lobe. Differential includes atypical pneumonias such as viral or fungal, interstitial pneumonias, edema related to volume overload/CHF, chronic interstitial diseases, hypersensitivity pneumonitis, and respiratory bronchiolitis. COVID-19 pneumonia can have this appearance. 3. Masslike density within the outer LEFT breast, of uncertain significance, neoplastic mass not excluded. Recommend correlation with diagnostic mammogram at a dedicated breast center after current issues are resolved. 4. Colonic diverticulosis without evidence of acute diverticulitis. 5. Fluid, and associated air-fluid levels, scattered throughout the  large and small bowel, suggesting ileus. No evidence of bowel obstruction. 6. No free fluid or abscess collection is seen within the abdomen or pelvis. No free intraperitoneal air. Aortic Atherosclerosis (ICD10-I70.0). Electronically Signed   By: Bary Richard M.D.   On: 04/08/2020 13:35   IR GASTROSTOMY TUBE MOD SED  Result Date: 03/31/2020 INDICATION: 74 year old female with a history of dysphagia EXAM: PERC PLACEMENT GASTROSTOMY MEDICATIONS: Vancomycin 1 gm IV; Antibiotics were administered within 1 hour of the procedure. ANESTHESIA/SEDATION: Versed 0.5 mg IV; Fentanyl 25 mcg IV Moderate Sedation Time:  10 minutes The patient was continuously monitored during the procedure by the interventional radiology nurse under my direct supervision. CONTRAST:  23mL OMNIPAQUE IOHEXOL 300 MG/ML SOLN - administered into the gastric lumen. FLUOROSCOPY TIME:  Fluoroscopy Time: 2 minutes 54 seconds (6 mGy). COMPLICATIONS: None PROCEDURE: Informed written consent was obtained from the patient and the patient's family after a thorough discussion of the procedural risks, benefits and alternatives. All questions were addressed. Maximal Sterile Barrier Technique was utilized including caps, mask, sterile gowns, sterile gloves, sterile drape, hand hygiene and skin antiseptic. A timeout was performed prior to the initiation of the procedure.  The epigastrium was prepped with Betadine in a sterile fashion, and a sterile drape was applied covering the operative field. A sterile gown and sterile gloves were used for the procedure. A 5-French orogastric tube is placed under fluoroscopic guidance. Scout imaging of the abdomen confirms barium within the transverse colon. The stomach was distended with gas. Under fluoroscopic guidance, an 18 gauge needle was utilized to puncture the anterior wall of the body of the stomach. An Amplatz wire was advanced through the needle passing a T fastener into the lumen of the stomach. The T fastener was  secured for gastropexy. A 9-French sheath was inserted. A snare was advanced through the 9-French sheath. A Teena Dunk was advanced through the orogastric tube. It was snared then pulled out the oral cavity, pulling the snare, as well. The leading edge of the gastrostomy was attached to the snare. It was then pulled down the esophagus and out the percutaneous site. Tube secured in place. Contrast was injected. Patient tolerated the procedure well and remained hemodynamically stable throughout. No complications were encountered and no significant blood loss encountered. IMPRESSION: Status post fluoroscopic placed percutaneous gastrostomy tube, with 20 [place] pull-through. Signed, Yvone Neu. Loreta Ave, DO [facility] Electronically Signed   By: Gilmer Mor D.O.   On: 03/31/2020 12:59   DG CHEST PORT 1 VIEW  Result Date: 04/04/2020 CLINICAL DATA:  Fever and vomiting. EXAM: PORTABLE CHEST 1 VIEW COMPARISON:  03/28/2020 FINDINGS: Feeding tube has been removed. Stable elevation of the RIGHT hemidiaphragm. There is minimal subsegmental atelectasis at the LEFT lung base. No consolidations or pleural effusions. No pulmonary edema. IMPRESSION: Minimal LEFT lower lobe atelectasis. Electronically Signed   By: Norva Pavlov M.D.   On: 04/04/2020 12:34   DG CHEST PORT 1 VIEW  Result Date: 03/28/2020 CLINICAL DATA:  74 year old with fever and vomiting. EXAM: PORTABLE CHEST 1 VIEW COMPARISON:  03/09/2020 and earlier. FINDINGS: Cardiac silhouette normal in size, unchanged. Thoracic aorta tortuous and atherosclerotic, unchanged. Chronic elevation of the RIGHT hemidiaphragm with associated scar/atelectasis at the RIGHT lung base. Calcified RIGHT hilar lymph nodes again noted as is the calcified granuloma in the RIGHT lung apex. Lungs otherwise clear. No localized airspace consolidation. No pleural effusions. No pneumothorax. Normal pulmonary vascularity. IMPRESSION: 1. No  acute cardiopulmonary disease. 2. Chronic elevation of the RIGHT hemidiaphragm with associated scar/atelectasis at the RIGHT lung base. 3. Old granulomatous disease. Electronically Signed   By: Hulan Saas M.D.   On: 03/28/2020 15:20   DG Abd Portable 1V  Result Date: 03/19/2020 CLINICAL DATA:  Feeding tube placement EXAM: PORTABLE ABDOMEN - 1 VIEW COMPARISON:  03/12/2020 FINDINGS: Weighted enteric feeding tube tip terminates within the expected location of the distal stomach. Visualized bowel gas pattern is nonspecific. No gross free intraperitoneal air. Cement augmentation of L2. Healed posterior rib fractures involving the right tenth and eleventh ribs. IMPRESSION: Weighted enteric feeding tube tip terminates within the expected location of the distal stomach. Electronically Signed   By: Duanne Guess D.O.   On: 03/19/2020 14:36     Time Spent in minutes  30     Laverna Peace M.D on 04/17/2020 at 1:48 PM  To page go to [email or web address] - password [facility]

## 2020-04-17 NOTE — Plan of Care (Signed)

## 2020-04-18 LAB — BASIC METABOLIC PANEL
Anion gap: 9 (ref 5–15)
BUN: 40 mg/dL — ABNORMAL HIGH (ref 8–23)
CO2: 27 mmol/L (ref 22–32)
Calcium: 9 mg/dL (ref 8.9–10.3)
Chloride: 109 mmol/L (ref 98–111)
Creatinine, Ser: 0.72 mg/dL (ref 0.44–1.00)
GFR calc Af Amer: >60 mL/min (ref >60)
GFR calc non Af Amer: >60 mL/min (ref >60)
Glucose, Bld: 191 mg/dL — ABNORMAL HIGH (ref 70–99)
Potassium: 4.5 mmol/L (ref 3.5–5.1)
Sodium: 145 mmol/L (ref 135–145)

## 2020-04-18 LAB — CBC WITH DIFFERENTIAL/PLATELET
Abs Immature Granulocytes: 0.19 10*3/uL — ABNORMAL HIGH (ref 0.00–0.07)
Basophils Absolute: 0.1 10*3/uL (ref 0.0–0.1)
Basophils Relative: 1 %
Eosinophils Absolute: 0.3 10*3/uL (ref 0.0–0.5)
Eosinophils Relative: 2 %
HCT: 26.1 % — ABNORMAL LOW (ref 36.0–46.0)
Hemoglobin: 7.7 g/dL — ABNORMAL LOW (ref 12.0–15.0)
Immature Granulocytes: 2 %
Lymphocytes Relative: 18 %
Lymphs Abs: 2.1 10*3/uL (ref 0.7–4.0)
MCH: 29.2 pg (ref 26.0–34.0)
MCHC: 29.5 g/dL — ABNORMAL LOW (ref 30.0–36.0)
MCV: 98.9 fL (ref 80.0–100.0)
Monocytes Absolute: 0.8 10*3/uL (ref 0.1–1.0)
Monocytes Relative: 7 %
Neutro Abs: 8.2 10*3/uL — ABNORMAL HIGH (ref 1.7–7.7)
Neutrophils Relative %: 70 %
Platelets: 330 10*3/uL (ref 150–400)
RBC: 2.64 MIL/uL — ABNORMAL LOW (ref 3.87–5.11)
RDW: 18.7 % — ABNORMAL HIGH (ref 11.5–15.5)
WBC: 11.6 10*3/uL — ABNORMAL HIGH (ref 4.0–10.5)
nRBC: 0 % (ref 0.0–0.2)

## 2020-04-18 LAB — GLUCOSE, CAPILLARY
Glucose-Capillary: 157 mg/dL — ABNORMAL HIGH (ref 70–99)
Glucose-Capillary: 152 mg/dL — ABNORMAL HIGH (ref 70–99)
Glucose-Capillary: 179 mg/dL — ABNORMAL HIGH (ref 70–99)
Glucose-Capillary: 174 mg/dL — ABNORMAL HIGH (ref 70–99)
Glucose-Capillary: 144 mg/dL — ABNORMAL HIGH (ref 70–99)
Glucose-Capillary: 181 mg/dL — ABNORMAL HIGH (ref 70–99)

## 2020-04-18 NOTE — Progress Notes (Signed)
[facility]  PROGRESS NOTE  Dashauna Heymann ZOX:[id number] DOB: [birth year] DOA: 03/08/2020 PCP: Patient, No Pcp Per Admit date - 03/08/2020   Admitting Physician A Grier Mitts., MD  Outpatient Primary MD for the patient is Patient, No Pcp Per  LOS - 41 Brief Narrative   Antwan Bribiesca is a 74 y.o. year old female with medical history significant for type 2 diabetes, HTN, MI, recent CVA (01/2020) who presented on 03/08/2020 with changes in mental status noted at her facility, intractable nausea vomiting and was found to initially have AKI with lactic acidosis that improved with supportive care.  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  Hospital course complicated by severe dysphagia requiring initiation of tube feeds followed by PEG tube placement on 8/4 as well as empiric Zosyn for aspiration pneumonia. .  Palliative care was consulted to assist in goals of care discussion due to patient's poor prognosis in meaningful recovery  Subjective  No family at bedside today.  No acute events overnight. Resting in bed. Was able to answer yes and no to simple questions and say her son's name today.  A & P   Acute combined toxic/metabolic encephalopathy, multifactorial etiology including acute CVA, hypernatremia (currently resolved), hospital-acquired delirium, stable.  EEG unrevealing, B12/TSH within normal limits.  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  Based off MRI on 7/18.  In setting of recent CVA on 6/21.  Completed DAPT for 3 weeks.  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  Peak of 153, now down to 142 with IV fluids and free water.  Expect this to continue to fluctuate given patient unable to tolerate p.o. -Monitor BMP, not currently requiring fluids  HTN, stable -Continue amlodipine 10 mg, metoprolol 25 mg twice daily  AKI, resolved with IV fluids.  Suspect prerenal on admission.  Nephrology no longer following.  Type 2 diabetes, A1c 6.9, controlled -Holding home Metformin -Continue to monitor CBGs, Levemir 10 units twice daily,/as needed   Acute urinary retention, resolved with Foley catheter placement 8/9 -Monitor output  Severe constipation -Fecal impaction on abdominal x-ray on 8/8, improved with optimization of bowel regimen -continue MiraLAX  Depression -Discontinued low-dose Remeron due to altered mentation  Left breast mass, seen on CT chest on 8/12 of uncertain significance -Pending goals of care discussion would need diagnostic mammogram as outpatient  Goals of care discussion.  Previous providers have had discussion with patient's son during her hospitalization and her poor prognosis.  I spoke with daughter [name] 08/18 and son by phone on 8/18, still considering when they would like to pursue comfort care measures -Appreciate palliative care assistance      Family Communication  : Spoke with daughter [name] at bedside on 8/18, spoke  with son by phone on 8/18, will update   Code Status : Full code  Disposition Plan  :  Patient is from home. Anticipated d/c date:   Greater than 3 days barriers to d/c or necessity for inpatient status:  Continue goals of care discussions with family, close monitoring of mental status for any improvement Consults  : Palliative  Procedures  :    DVT Prophylaxis  : Plavix  Lab Results  Component Value Date   PLT 330 04/18/2020    Diet :  Diet Order            Diet NPO time specified  Diet effective midnight                  Inpatient Medications Scheduled Meds: . amLODipine  10 mg Per Tube Daily  . chlorhexidine  15 mL Mouth Rinse BID  . Chlorhexidine Gluconate Cloth  6 each Topical Daily  . clopidogrel  75 mg Per Tube Daily  . feeding supplement (GLUCERNA 1.5 CAL)  960 mL Per Tube Q24H  . feeding supplement (PROSource TF)  45 mL Per Tube Daily  . heparin injection (subcutaneous)  5,000 Units Subcutaneous Q8H  . insulin aspart  0-9 Units Subcutaneous Q4H  . insulin detemir  10 Units Subcutaneous BID  . mouth rinse  15 mL Mouth Rinse BID  . metoprolol tartrate  25 mg Per Tube BID  . pantoprazole sodium  40 mg Per Tube Daily  . rosuvastatin  5 mg Per Tube Daily   Continuous Infusions: PRN Meds:.acetaminophen **OR** [DISCONTINUED] acetaminophen, lip balm, polyethylene glycol, Resource ThickenUp Clear  Antibiotics  :   Anti-infectives (From admission, onward)   Start     Dose/Rate Route Frequency Ordered Stop   04/05/20 1800  vancomycin (VANCOREADY) IVPB 750 mg/150 mL  Status:  Discontinued        750 mg 150 mL/hr over 60 Minutes Intravenous Every 24 hours 04/04/20 1727 04/07/20 0805   04/04/20 1730  vancomycin (VANCOREADY) IVPB 1250 mg/250 mL        1,250 mg 166.7 mL/hr over 90 Minutes Intravenous  Once 04/04/20 1727 04/04/20 1948   04/04/20 1730  piperacillin-tazobactam (ZOSYN) IVPB 3.375 g  Status:  Discontinued        3.375 g 12.5 mL/hr over 240 Minutes Intravenous Every 8 hours 04/04/20 1727 04/12/20 1003   04/04/20 1715  piperacillin-tazobactam (ZOSYN) IVPB 3.375 g  Status:  Discontinued         Note to Pharmacy: Adjust as per renal function   3.375 g 100 mL/hr over 30 Minutes Intravenous Every 8 hours 04/04/20 1706 04/04/20 1726   03/31/20 1207  vancomycin (VANCOCIN) 1-5 GM/200ML-% IVPB       Note to Pharmacy: Erie Noe   : cabinet override      03/31/20 1207 03/31/20 1232   03/31/20 1200  vancomycin (VANCOCIN) IVPB 1000 mg/200 mL premix        1,000 mg 200 mL/hr over 60 Minutes Intravenous  Once 03/31/20 1155 03/31/20 1509   03/10/20 1200  cefTRIAXone (ROCEPHIN) 2 g in sodium chloride 0.9 % 100 mL IVPB        2 g 200 mL/hr over 30 Minutes Intravenous Every 24 hours 03/10/20 0924 03/14/20 1333   03/09/20 1000  ceFEPIme (MAXIPIME) 1 g in sodium chloride 0.9 % 100 mL IVPB  Status:  Discontinued        1 g 200 mL/hr over 30 Minutes Intravenous Every 24 hours 03/08/20 1219  03/10/20 0924   03/08/20 1219  vancomycin variable dose per unstable renal function (pharmacist dosing)  Status:  Discontinued         Does not apply See admin instructions 03/08/20 1219 03/10/20 0906   03/08/20 1100  vancomycin (VANCOCIN) IVPB 1000 mg/200 mL premix        1,000 mg 200 mL/hr over 60 Minutes Intravenous  Once 03/08/20 1046 03/08/20 1406   03/08/20 1100  ceFEPIme (MAXIPIME) 2 g in sodium chloride 0.9 % 100 mL IVPB        2 g 200 mL/hr over 30 Minutes Intravenous  Once 03/08/20 1046 03/08/20 1211       Objective   Vitals:   04/18/20 0316 04/18/20 0500 04/18/20 0739 04/18/20 1206  BP: (!) 131/53 130/70 130/60 130/64  Pulse: 76 92 82 79  Resp: 18  20 19   Temp: 98 F (36.7 C) 98.3 F (36.8 C) 99.4 F (37.4 C) 99.2 F (37.3 C)  TempSrc: Oral Oral Axillary Axillary  SpO2: 100% 100% 98% 100%  Weight: 58.1 kg 69.4 kg    Height:        SpO2: 100 % O2 Flow Rate (L/min): 2 L/min  Wt Readings from Last 3 Encounters:  04/18/20 69.4 kg  02/24/20 62.1 kg  02/20/20 66.8 kg     Intake/Output Summary (Last 24 hours) at 04/18/2020 1350 Last data filed at 04/18/2020 1000 Gross per 24  hour  Intake 2145 ml  Output 1300 ml  Net 845 ml    Physical Exam:    Lying in bed comfortably,   not able to move any of her extremities Greeted me after I said hello, answered yes and not to simple questions. Told me her son Brad's name Normal respiratory effort on 2 L, lungs clear bilaterally, distant breath sounds Abdomen soft, nondistended No peripheral edema Regular rate and rhythm   I have personally reviewed the following:   Data Reviewed:  CBC Recent Labs  Lab 04/12/20 0227 04/14/20 0130 04/15/20 0215 04/16/20 0140 04/18/20 0150  WBC 18.5* 15.0* 12.9* 13.5* 11.6*  HGB 8.3* 8.0* 8.2* 8.8* 7.7*  HCT 27.2* 26.0* 26.3* 28.8* 26.1*  PLT 366 330 381 350 330  MCV 95.4 94.9 97.0 96.3 98.9  MCH 29.1 29.2 30.3 29.4 29.2  MCHC 30.5 30.8 31.2 30.6 29.5*  RDW 16.1* 17.0* 17.4* 18.4* 18.7*  LYMPHSABS 2.7 2.8 2.2 2.2 2.1  MONOABS 0.9 0.6 0.6 0.6 0.8  EOSABS 0.3 0.5 0.4 0.2 0.3  BASOSABS 0.1 0.1 0.1 0.1 0.1    Chemistries  Recent Labs  Lab 04/12/20 0227 04/14/20 0130 04/15/20 0215 04/16/20 0140 04/18/20 0150  NA 153* 142 143 145 145  K 3.3* 3.5 4.2 4.4 4.5  CL 115* 107 109 109 109  CO2 26 25 24 26 27   GLUCOSE 187* 165* 179* 156* 191*  BUN 34* 35* 36* 37* 40*  CREATININE 0.92 0.70 0.72 0.77 0.72  CALCIUM 9.2 8.5* 8.7* 9.1 9.0   ------------------------------------------------------------------------------------------------------------------ No results for input(s): CHOL, HDL, LDLCALC, TRIG, CHOLHDL, LDLDIRECT in the last 72 hours.  Lab Results  Component Value Date   HGBA1C 6.9 (H) 02/15/2020   ------------------------------------------------------------------------------------------------------------------ No results for input(s): TSH, T4TOTAL, T3FREE, THYROIDAB in the last 72 hours.  Invalid input(s): FREET3 ------------------------------------------------------------------------------------------------------------------ No results for input(s):  VITAMINB12, FOLATE, FERRITIN, TIBC, IRON, RETICCTPCT in the last 72 hours.  Coagulation profile No results for input(s): INR, PROTIME in the last 168 hours.  No results for input(s): DDIMER in the last 72  hours.  Cardiac Enzymes No results for input(s): CKMB, TROPONINI, MYOGLOBIN in the last 168 hours.  Invalid input(s): CK ------------------------------------------------------------------------------------------------------------------ No results found for: BNP  Micro Results No results found for this or any previous visit (from the past 240 hour(s)).  Radiology Reports CT ABDOMEN PELVIS WO CONTRAST  Result Date: 04/08/2020 CLINICAL DATA:  Fever, leukocytosis. Pneumonia, effusion or abscess suspected. EXAM: CT CHEST, ABDOMEN AND PELVIS WITHOUT CONTRAST TECHNIQUE: Multidetector CT imaging of the chest, abdomen and pelvis was performed following the standard protocol without IV contrast. COMPARISON:  CT abdomen dated 03/08/2020. FINDINGS: CT CHEST FINDINGS Cardiovascular: No thoracic aortic aneurysm. No pericardial effusion. Thoracic aortic atherosclerosis. Coronary artery calcifications, particularly dense within the LEFT anterior descending coronary artery. Mediastinum/Nodes: No mass or enlarged lymph nodes are seen within the mediastinum. Esophagus is unremarkable. Trachea appears normal. Lungs/Pleura: Dense consolidations at the bilateral lung bases, RIGHT greater than LEFT. Additional patchy ground-glass opacities within the peripheral portions of each lung, most prominent within the upper lobes and LEFT lower lobe. No pleural effusion is seen. No pneumothorax. Musculoskeletal: No acute or suspicious osseous finding. Masslike density within the outer LEFT breast, of uncertain significance. CT ABDOMEN PELVIS FINDINGS Hepatobiliary: No focal liver abnormality. Gallbladder is unremarkable. No bile duct dilatation. Pancreas: Unremarkable. No pancreatic ductal dilatation or surrounding  inflammatory changes. Spleen: Normal in size without focal abnormality. Adrenals/Urinary Tract: Adrenal glands appear normal. Kidneys are unremarkable without mass, stone or hydronephrosis. No perinephric fluid. Bladder is decompressed by Foley catheter. Stomach/Bowel: No dilated large or small bowel loops. Fluid, and associated air-fluid levels scattered throughout the large and small bowel, suggesting ileus. Diverticulosis of the sigmoid and descending colon but no focal inflammatory change to suggest acute diverticulitis. Gastrostomy tube appears appropriately positioned. Vascular/Lymphatic: Aortic atherosclerosis. No enlarged lymph nodes seen. Reproductive: No adnexal mass or free fluid Other: No free fluid or abscess collection is seen within the abdomen or pelvis. No free intraperitoneal air. Musculoskeletal: No acute or suspicious osseous finding. Surgical changes of kyphoplasty/vertebroplasty at the L2 vertebral body level. IMPRESSION: 1. Dense consolidations at the bilateral lung bases, RIGHT greater than LEFT, consistent with multifocal pneumonia and/or aspiration pneumonitis. 2. Additional patchy ground-glass opacities within the peripheral portions of each lung, most prominent within the upper lobes and LEFT lower lobe. Differential includes atypical pneumonias such as viral or fungal, interstitial pneumonias, edema related to volume overload/CHF, chronic interstitial diseases, hypersensitivity pneumonitis, and respiratory bronchiolitis. COVID-19 pneumonia can have this appearance. 3. Masslike density within the outer LEFT breast, of uncertain significance, neoplastic mass not excluded. Recommend correlation with diagnostic mammogram at a dedicated breast center after current issues are resolved. 4. Colonic diverticulosis without evidence of acute diverticulitis. 5. Fluid, and associated air-fluid levels, scattered throughout the large and small bowel, suggesting ileus. No evidence of bowel obstruction.  6. No free fluid or abscess collection is seen within the abdomen or pelvis. No free intraperitoneal air. Aortic Atherosclerosis (ICD10-I70.0). Electronically Signed   By: Bary RichardStan  Maynard M.D.   On: 04/08/2020 13:35   DG Abd 1 View  Result Date: 04/04/2020 CLINICAL DATA:  Abdominal distension, vomiting, fever EXAM: ABDOMEN - 1 VIEW COMPARISON:  03/19/2020 abdominal radiograph FINDINGS: Percutaneous gastrostomy tube terminates over the body of the stomach. Vertebroplasty changes again noted at L2. no disproportionately dilated small bowel loops. Moderate colorectal stool volume with dilation of the rectum to 8.9 cm diameter. No evidence of pneumatosis or pneumoperitoneum. Clear lung bases. IMPRESSION: Nonobstructive bowel gas pattern. Moderate colorectal stool volume, with dilation of  the rectum to 8.9 cm, cannot exclude rectal fecal impaction. Electronically Signed   By: Delbert Phenix M.D.   On: 04/04/2020 12:56   CT CHEST WO CONTRAST  Result Date: 04/08/2020 CLINICAL DATA:  Fever, leukocytosis. Pneumonia, effusion or abscess suspected. EXAM: CT CHEST, ABDOMEN AND PELVIS WITHOUT CONTRAST TECHNIQUE: Multidetector CT imaging of the chest, abdomen and pelvis was performed following the standard protocol without IV contrast. COMPARISON:  CT abdomen dated 03/08/2020. FINDINGS: CT CHEST FINDINGS Cardiovascular: No thoracic aortic aneurysm. No pericardial effusion. Thoracic aortic atherosclerosis. Coronary artery calcifications, particularly dense within the LEFT anterior descending coronary artery. Mediastinum/Nodes: No mass or enlarged lymph nodes are seen within the mediastinum. Esophagus is unremarkable. Trachea appears normal. Lungs/Pleura: Dense consolidations at the bilateral lung bases, RIGHT greater than LEFT. Additional patchy ground-glass opacities within the peripheral portions of each lung, most prominent within the upper lobes and LEFT lower lobe. No pleural effusion is seen. No pneumothorax.  Musculoskeletal: No acute or suspicious osseous finding. Masslike density within the outer LEFT breast, of uncertain significance. CT ABDOMEN PELVIS FINDINGS Hepatobiliary: No focal liver abnormality. Gallbladder is unremarkable. No bile duct dilatation. Pancreas: Unremarkable. No pancreatic ductal dilatation or surrounding inflammatory changes. Spleen: Normal in size without focal abnormality. Adrenals/Urinary Tract: Adrenal glands appear normal. Kidneys are unremarkable without mass, stone or hydronephrosis. No perinephric fluid. Bladder is decompressed by Foley catheter. Stomach/Bowel: No dilated large or small bowel loops. Fluid, and associated air-fluid levels scattered throughout the large and small bowel, suggesting ileus. Diverticulosis of the sigmoid and descending colon but no focal inflammatory change to suggest acute diverticulitis. Gastrostomy tube appears appropriately positioned. Vascular/Lymphatic: Aortic atherosclerosis. No enlarged lymph nodes seen. Reproductive: No adnexal mass or free fluid Other: No free fluid or abscess collection is seen within the abdomen or pelvis. No free intraperitoneal air. Musculoskeletal: No acute or suspicious osseous finding. Surgical changes of kyphoplasty/vertebroplasty at the L2 vertebral body level. IMPRESSION: 1. Dense consolidations at the bilateral lung bases, RIGHT greater than LEFT, consistent with multifocal pneumonia and/or aspiration pneumonitis. 2. Additional patchy ground-glass opacities within the peripheral portions of each lung, most prominent within the upper lobes and LEFT lower lobe. Differential includes atypical pneumonias such as viral or fungal, interstitial pneumonias, edema related to volume overload/CHF, chronic interstitial diseases, hypersensitivity pneumonitis, and respiratory bronchiolitis. COVID-19 pneumonia can have this appearance. 3. Masslike density within the outer LEFT breast, of uncertain significance, neoplastic mass not  excluded. Recommend correlation with diagnostic mammogram at a dedicated breast center after current issues are resolved. 4. Colonic diverticulosis without evidence of acute diverticulitis. 5. Fluid, and associated air-fluid levels, scattered throughout the large and small bowel, suggesting ileus. No evidence of bowel obstruction. 6. No free fluid or abscess collection is seen within the abdomen or pelvis. No free intraperitoneal air. Aortic Atherosclerosis (ICD10-I70.0). Electronically Signed   By: Bary Richard M.D.   On: 04/08/2020 13:35   IR GASTROSTOMY TUBE MOD SED  Result Date: 03/31/2020 INDICATION: 74 year old female with a history of dysphagia EXAM: PERC PLACEMENT GASTROSTOMY MEDICATIONS: Vancomycin 1 gm IV; Antibiotics were administered within 1 hour of the procedure. ANESTHESIA/SEDATION: Versed 0.5 mg IV; Fentanyl 25 mcg IV Moderate Sedation Time:  10 minutes The patient was continuously monitored during the procedure by the interventional radiology nurse under my direct supervision. CONTRAST:  50mL OMNIPAQUE IOHEXOL 300 MG/ML SOLN - administered into the gastric lumen. FLUOROSCOPY TIME:  Fluoroscopy Time: 2 minutes 54 seconds (6 mGy). COMPLICATIONS: None PROCEDURE: Informed written consent was obtained from the patient  and the patient's family after a thorough discussion of the procedural risks, benefits and alternatives. All questions were addressed. Maximal Sterile Barrier Technique was utilized including caps, mask, sterile gowns, sterile gloves, sterile drape, hand hygiene and skin antiseptic. A timeout was performed prior to the initiation of the procedure. The epigastrium was prepped with Betadine in a sterile fashion, and a sterile drape was applied covering the operative field. A sterile gown and sterile gloves were used for the procedure. A 5-French orogastric tube is placed under fluoroscopic guidance. Scout imaging of the abdomen confirms barium within the transverse colon. The stomach was  distended with gas. Under fluoroscopic guidance, an 18 gauge needle was utilized to puncture the anterior wall of the body of the stomach. An Amplatz wire was advanced through the needle passing a T fastener into the lumen of the stomach. The T fastener was secured for gastropexy. A 9-French sheath was inserted. A snare was advanced through the 9-French sheath. A Teena Dunk was advanced through the orogastric tube. It was snared then pulled out the oral cavity, pulling the snare, as well. The leading edge of the gastrostomy was attached to the snare. It was then pulled down the esophagus and out the percutaneous site. Tube secured in place. Contrast was injected. Patient tolerated the procedure well and remained hemodynamically stable throughout. No complications were encountered and no significant blood loss encountered. IMPRESSION: Status post fluoroscopic placed percutaneous gastrostomy tube, with 20 [place] pull-through. Signed, Yvone Neu. Loreta Ave, DO [facility] Radiology Electronically Signed   By: Gilmer Mor D.O.   On: 03/31/2020 12:59   DG CHEST PORT 1 VIEW  Result Date: 04/04/2020 CLINICAL DATA:  Fever and vomiting. EXAM: PORTABLE CHEST 1 VIEW COMPARISON:  03/28/2020 FINDINGS: Feeding tube has been removed. Stable elevation of the RIGHT hemidiaphragm. There is minimal subsegmental atelectasis at the LEFT lung base. No consolidations or pleural effusions. No pulmonary edema. IMPRESSION: Minimal LEFT lower lobe atelectasis. Electronically Signed   By: Norva Pavlov M.D.   On: 04/04/2020 12:34   DG CHEST PORT 1 VIEW  Result Date: 03/28/2020 CLINICAL DATA:  74 year old with fever and vomiting. EXAM: PORTABLE CHEST 1 VIEW COMPARISON:  03/09/2020 and earlier. FINDINGS: Cardiac silhouette normal in size, unchanged. Thoracic aorta tortuous and atherosclerotic, unchanged. Chronic elevation of the RIGHT hemidiaphragm with associated scar/atelectasis at the RIGHT  lung base. Calcified RIGHT hilar lymph nodes again noted as is the calcified granuloma in the RIGHT lung apex. Lungs otherwise clear. No localized airspace consolidation. No pleural effusions. No pneumothorax. Normal pulmonary vascularity. IMPRESSION: 1. No acute cardiopulmonary disease. 2. Chronic elevation of the RIGHT hemidiaphragm with associated scar/atelectasis at the RIGHT lung base. 3. Old granulomatous disease. Electronically Signed   By: Hulan Saas M.D.   On: 03/28/2020 15:20   DG Abd Portable 1V  Result Date: 03/19/2020 CLINICAL DATA:  Feeding tube placement EXAM: PORTABLE ABDOMEN - 1 VIEW COMPARISON:  03/12/2020 FINDINGS: Weighted enteric feeding tube tip terminates within the expected location of the distal stomach. Visualized bowel gas pattern is nonspecific. No gross free intraperitoneal air. Cement augmentation of L2. Healed posterior rib fractures involving the right tenth and eleventh ribs. IMPRESSION: Weighted enteric feeding tube tip terminates within the expected location of the distal stomach. Electronically Signed   By: Duanne Guess D.O.   On: 03/19/2020 14:36     Time Spent in minutes  30     Laverna Peace M.D on 04/18/2020 at 1:50 PM  To page  go to [email or web address] - password [facility]

## 2020-04-18 NOTE — Plan of Care (Signed)
  Problem: Education: Goal: Knowledge of General Education information will improve Description Including pain rating scale, medication(s)/side effects and non-pharmacologic comfort measures Outcome: Progressing   

## 2020-04-19 LAB — BASIC METABOLIC PANEL
Anion gap: 12 (ref 5–15)
BUN: 40 mg/dL — ABNORMAL HIGH (ref 8–23)
CO2: 25 mmol/L (ref 22–32)
Calcium: 9.3 mg/dL (ref 8.9–10.3)
Chloride: 108 mmol/L (ref 98–111)
Creatinine, Ser: 0.68 mg/dL (ref 0.44–1.00)
GFR calc Af Amer: >60 mL/min (ref >60)
GFR calc non Af Amer: >60 mL/min (ref >60)
Glucose, Bld: 223 mg/dL — ABNORMAL HIGH (ref 70–99)
Potassium: 4.5 mmol/L (ref 3.5–5.1)
Sodium: 145 mmol/L (ref 135–145)

## 2020-04-19 LAB — GLUCOSE, CAPILLARY
Glucose-Capillary: 187 mg/dL — ABNORMAL HIGH (ref 70–99)
Glucose-Capillary: 204 mg/dL — ABNORMAL HIGH (ref 70–99)
Glucose-Capillary: 200 mg/dL — ABNORMAL HIGH (ref 70–99)
Glucose-Capillary: 200 mg/dL — ABNORMAL HIGH (ref 70–99)
Glucose-Capillary: 191 mg/dL — ABNORMAL HIGH (ref 70–99)
Glucose-Capillary: 206 mg/dL — ABNORMAL HIGH (ref 70–99)
Glucose-Capillary: 216 mg/dL — ABNORMAL HIGH (ref 70–99)

## 2020-04-19 LAB — CBC WITH DIFFERENTIAL/PLATELET
Abs Immature Granulocytes: 0.15 10*3/uL — ABNORMAL HIGH (ref 0.00–0.07)
Basophils Absolute: 0.1 10*3/uL (ref 0.0–0.1)
Basophils Relative: 1 %
Eosinophils Absolute: 0.3 10*3/uL (ref 0.0–0.5)
Eosinophils Relative: 3 %
HCT: 26.9 % — ABNORMAL LOW (ref 36.0–46.0)
Hemoglobin: 8.4 g/dL — ABNORMAL LOW (ref 12.0–15.0)
Immature Granulocytes: 1 %
Lymphocytes Relative: 22 %
Lymphs Abs: 2.3 10*3/uL (ref 0.7–4.0)
MCH: 30.4 pg (ref 26.0–34.0)
MCHC: 31.2 g/dL (ref 30.0–36.0)
MCV: 97.5 fL (ref 80.0–100.0)
Monocytes Absolute: 0.7 10*3/uL (ref 0.1–1.0)
Monocytes Relative: 7 %
Neutro Abs: 6.9 10*3/uL (ref 1.7–7.7)
Neutrophils Relative %: 66 %
Platelets: 424 10*3/uL — ABNORMAL HIGH (ref 150–400)
RBC: 2.76 MIL/uL — ABNORMAL LOW (ref 3.87–5.11)
RDW: 18.9 % — ABNORMAL HIGH (ref 11.5–15.5)
WBC: 10.4 10*3/uL (ref 4.0–10.5)
nRBC: 0 % (ref 0.0–0.2)

## 2020-04-19 NOTE — Progress Notes (Signed)
Physical Therapy Treatment Patient Details Name: Tasha [name] MRN: [id number] DOB: [birth year] Today's Date: 04/19/2020    History of Present Illness Tasha [name] is a 74 y.o. 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 In hospital, R sided weakness not improving, MRI showed new acute infarcts of L pontine and post body R lateral ventricle. Pt had Gtube placed on 04/01/2020    PT Comments    Patient again alert throughout session. Focused on cervical PROM (see goals), some UE/LE PROM looking for ability to activate muscle groups (unable this date), and attempting to use oral movements to develop yes/no system of communication.  She consistently 4/4 times stuck out her tongue on command and closed her mouth on command. I tried to use stick out tongue for "yes" and only 1/5 times did this correctly with prompt "If you want to say yes, stick out your tongue... is your name Tasha [name]?"--and similar biographical information. Did better when I said "If your name is Tasha [name], stick out your tongue." Completing 2/5 correctly. Then attempted using closing mouth to express "no" and pt did not utilize appropriately on any trial (?fatigued by this time). Earlier discussed potential use of assistive technology with Ignacia Palma, OTR/L and due to pandemic it is unlikely to be able to borrow equipment for this purpose. On further discussion, patient's inability to elicit voluntary head movements eliminates many types of technology. In process of reaching out to Speech Therapy for their assistance. Will continue to work toward ROM goals for improved ability to position in upright/chair position for possible wheelchair tolerance.   Follow Up Recommendations  SNF     Equipment Recommendations  None recommended by PT    Recommendations for Other Services       Precautions / Restrictions  Precautions Precautions: Fall Precaution Comments: right hemiplegia, PEG Required Braces or Orthoses: Splint/Cast Splint/Cast: bil resting hand splints- night wear only    Mobility  Bed Mobility Overal bed mobility: Needs Assistance Bed Mobility: Rolling Rolling: Total assist;+2 for physical assistance;+2 for safety/equipment         General bed mobility comments: RN in and assisted therapist to turn pt onto her left side for promoting left cervical rotation   Transfers                    Ambulation/Gait                 Stairs             Wheelchair Mobility    Modified Rankin (Stroke Patients Only) Modified Rankin (Stroke Patients Only) Pre-Morbid Rankin Score: Severe disability Modified Rankin: Severe disability     Balance                                            Cognition Arousal/Alertness: Awake/alert Behavior During Therapy: Flat affect Overall Cognitive Status: Difficult to assess Area of Impairment: Following commands                       Following Commands: Follows one step commands with increased time     Problem Solving: Slow processing;Decreased initiation General Comments: Awake throughout; made one verbal utterance (unintelligible); could not say yes/no; does stick out tongue and close mouth  4/4 trials      Exercises General Exercises - Upper Extremity Elbow Extension: PROM;Left;Supine Digit Composite Flexion: PROM;Both;Supine Composite Extension: PROM;Both;Supine General Exercises - Lower Extremity Ankle Circles/Pumps: Both;Supine;PROM Heel Slides: PROM;Both;Supine Other Exercises Other Exercises: 5x Lt cervical rotation with 30 second hold    General Comments        Pertinent Vitals/Pain Pain Assessment: Faces Faces Pain Scale: Hurts a little bit Pain Location: flinch briefly with LLE ROM Pain Descriptors / Indicators: Grimacing Pain Intervention(s): Limited activity within  patient's tolerance;Monitored during session;Repositioned    Home Living                      Prior Function            PT Goals (current goals can now be found in the care plan section) Acute Rehab PT Goals Patient Stated Goal: Unable to state PT Goal Formulation: Patient unable to participate in goal setting Time For Goal Achievement: 04/27/20 Potential to Achieve Goals: Poor Progress towards PT goals: Progressing toward goals    Frequency    Min 2X/week      PT Plan Current plan remains appropriate    Co-evaluation              AM-PAC PT "6 Clicks" Mobility   Outcome Measure  Help needed turning from your back to your side while in a flat bed without using bedrails?: Total Help needed moving from lying on your back to sitting on the side of a flat bed without using bedrails?: Total Help needed moving to and from a bed to a chair (including a wheelchair)?: Total Help needed standing up from a chair using your arms (e.g., wheelchair or bedside chair)?: Total Help needed to walk in hospital room?: Total Help needed climbing 3-5 steps with a railing? : Total 6 Click Score: 6    End of Session   Activity Tolerance: Patient tolerated treatment well Patient left: in bed;with bed alarm set Nurse Communication: Other (comment) (attempted yes/no with oral movements and unable) PT Visit Diagnosis: Other abnormalities of gait and mobility (R26.89);Muscle weakness (generalized) (M62.81);Hemiplegia and hemiparesis Hemiplegia - Right/Left: Right Hemiplegia - dominant/non-dominant: Dominant Hemiplegia - caused by: Cerebral infarction     Time: [id number] PT Time Calculation (min) (ACUTE ONLY): 34 min  Charges:  $Therapeutic Exercise: 23-37 mins                      Jerolyn Center, PT Pager [phone]    Zena Amos 04/19/2020, 10:04 AM

## 2020-04-19 NOTE — Plan of Care (Signed)

## 2020-04-19 NOTE — Progress Notes (Addendum)
[facility]  PROGRESS NOTE  Tasha [name] KZL:[id number] DOB: 22-May-1946 DOA: 03/08/2020 PCP: Patient, No Pcp Per Admit date - 03/08/2020   Admitting Physician A Grier Mitts., MD  Outpatient Primary MD for the patient is Patient, No Pcp Per  LOS - 42 Brief Narrative   Tasha [name] is a 74 y.o. year old female with medical history significant for type 2 diabetes, HTN, MI, recent CVA (01/2020) who presented on 03/08/2020 with changes in mental status noted at her facility, intractable nausea vomiting and was found to initially have AKI with lactic acidosis that improved with supportive care.  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  Hospital course complicated by severe dysphagia requiring initiation of tube feeds followed by PEG tube placement on 8/4 as well as empiric Zosyn for aspiration pneumonia. .  Palliative care was consulted to assist in goals of care discussion due to patient's poor prognosis in meaningful recovery  Subjective  No family at bedside today.  No acute events overnight. Resting in bed Worked with PT this am and was alert throughout the session A & P   Acute combined toxic/metabolic encephalopathy, multifactorial etiology including acute CVA, hypernatremia (currently resolved), hospital-acquired delirium, stable.  EEG unrevealing, B12/TSH within normal limits.  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  Based off MRI on 7/18.  In setting of recent CVA on 6/21.  Completed DAPT for 3 weeks.  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  Peak of 153, now down to 142 with IV fluids and free water.  Expect this to continue to fluctuate given patient unable to tolerate p.o. -Monitor BMP, not currently requiring fluids  HTN, stable -Continue amlodipine 10 mg, metoprolol 25 mg twice daily  AKI, resolved with IV fluids.  Suspect prerenal on admission.  Nephrology no longer following.  Type 2 diabetes, A1c 6.9, controlled. FBG in 200s-220s -Holding home Metformin -Continue to monitor CBGs if elevation persists can increase her Levemir, Levemir 10 units twice daily, sliding scale as needed   Acute urinary retention, resolved with Foley catheter placement 8/9 -Monitor output  Severe constipation -Fecal impaction on abdominal x-ray on 8/8, improved with optimization of bowel regimen -continue MiraLAX  Depression -Discontinued low-dose Remeron due to altered mentation  Left breast mass, seen on CT chest on 8/12 of uncertain significance -Pending goals of care discussion would need diagnostic mammogram as outpatient  Goals of care discussion.  Previous providers have had discussion with patient's son during her hospitalization and her poor prognosis.  I spoke with daughter [name] 08/18 and son by phone on 8/18, still considering when they would like to pursue comfort care measures -Appreciate palliative care assistance      Family  Communication  : Spoke with son Nida BoatmanBrad on phone on 8/23  Code Status : Full code  Disposition Plan  :  Patient is from home.  Anticipated d/c date:  Greater than 3 days barriers to d/c or necessity for inpatient status:  Continue goals of care discussions with family, close monitoring of mental status for any improvement, PT recs SNF Consults  : Palliative  Procedures  :    DVT Prophylaxis  : Plavix  Lab Results  Component Value Date   PLT 424 (H) 04/19/2020    Diet :  Diet Order            Diet NPO time specified  Diet effective midnight                  Inpatient Medications Scheduled Meds: . amLODipine  10 mg Per Tube Daily  . chlorhexidine  15 mL Mouth Rinse BID  . Chlorhexidine Gluconate Cloth  6 each Topical Daily  . clopidogrel  75 mg Per Tube Daily  . feeding supplement (GLUCERNA 1.5 CAL)  960 mL Per Tube Q24H  . feeding supplement (PROSource TF)  45 mL Per Tube Daily  . heparin injection (subcutaneous)  5,000 Units Subcutaneous Q8H  . insulin aspart  0-9 Units Subcutaneous Q4H  . insulin detemir  10 Units Subcutaneous BID  . mouth rinse  15 mL Mouth Rinse BID  . metoprolol tartrate  25 mg Per Tube BID  . pantoprazole sodium  40 mg Per Tube Daily  . rosuvastatin  5 mg Per Tube Daily   Continuous Infusions: PRN Meds:.acetaminophen **OR** [DISCONTINUED] acetaminophen, lip balm, polyethylene glycol, Resource ThickenUp Clear  Antibiotics  :   Anti-infectives (From admission, onward)   Start     Dose/Rate Route Frequency Ordered Stop   04/05/20 1800  vancomycin (VANCOREADY) IVPB 750 mg/150 mL  Status:  Discontinued        750 mg 150 mL/hr over 60 Minutes Intravenous Every 24 hours 04/04/20 1727 04/07/20 0805   04/04/20 1730  vancomycin (VANCOREADY) IVPB 1250 mg/250 mL        1,250 mg 166.7 mL/hr over 90 Minutes Intravenous  Once 04/04/20 1727 04/04/20 1948   04/04/20 1730  piperacillin-tazobactam (ZOSYN) IVPB 3.375 g  Status:  Discontinued        3.375 g 12.5 mL/hr over 240 Minutes Intravenous Every 8 hours 04/04/20 1727 04/12/20 1003   04/04/20 1715  piperacillin-tazobactam (ZOSYN)  IVPB 3.375 g  Status:  Discontinued       Note to Pharmacy: Adjust as per renal function   3.375 g 100 mL/hr over 30 Minutes Intravenous Every 8 hours 04/04/20 1706 04/04/20 1726   03/31/20 1207  vancomycin (VANCOCIN) 1-5 GM/200ML-% IVPB       Note to Pharmacy: [name], [name]   : cabinet override      03/31/20 1207 03/31/20 1232   03/31/20 1200  vancomycin (VANCOCIN) IVPB 1000 mg/200 mL premix        1,000 mg 200 mL/hr over 60 Minutes Intravenous  Once 03/31/20 1155 03/31/20 1509   03/10/20 1200  cefTRIAXone (ROCEPHIN) 2 g in sodium chloride 0.9 % 100 mL IVPB        2 g 200 mL/hr over 30 Minutes Intravenous Every 24 hours 03/10/20 0924 03/14/20 1333   03/09/20 1000  ceFEPIme (MAXIPIME) 1 g in sodium chloride 0.9 % 100 mL IVPB  Status:  Discontinued        1 g 200 mL/hr over 30 Minutes Intravenous  Every 24 hours 03/08/20 1219 03/10/20 0924   03/08/20 1219  vancomycin variable dose per unstable renal function (pharmacist dosing)  Status:  Discontinued         Does not apply See admin instructions 03/08/20 1219 03/10/20 0906   03/08/20 1100  vancomycin (VANCOCIN) IVPB 1000 mg/200 mL premix        1,000 mg 200 mL/hr over 60 Minutes Intravenous  Once 03/08/20 1046 03/08/20 1406   03/08/20 1100  ceFEPIme (MAXIPIME) 2 g in sodium chloride 0.9 % 100 mL IVPB        2 g 200 mL/hr over 30 Minutes Intravenous  Once 03/08/20 1046 03/08/20 1211       Objective   Vitals:   04/19/20 0909 04/19/20 1245 04/19/20 1253 04/19/20 1529  BP: (!) 147/68 (!) 153/68 (!) 145/55 (!) 157/68  Pulse: 99 87  88  Resp: Temp: 99.1 F (37.3 C) 99.5 F (37.5 C)  98.3 F (36.8 C)  TempSrc: Oral Oral  Oral  SpO2: 100% 100%  100%  Weight:      Height:        SpO2: 100 % O2 Flow Rate (L/min): 2 L/min  Wt Readings from Last 3 Encounters:  04/19/20 69 kg  02/24/20 62.1 kg  02/20/20 66.8 kg     Intake/Output Summary (Last 24 hours) at 04/19/2020 1543 Last data filed at 04/19/2020 0318 Gross per  24 hour  Intake 80 ml  Output 2100 ml  Net -2020 ml    Physical Exam:    Lying in bed comfortably,   not able to move any of her extremities Normal respiratory effort on 2 L, lungs clear bilaterally, distant breath sounds Abdomen soft, non-distended, feeding tube in place No peripheral edema Regular rate and rhythm   I have personally reviewed the following:   Data Reviewed:  CBC Recent Labs  Lab 04/14/20 0130 04/15/20 0215 04/16/20 0140 04/18/20 0150 04/19/20 0155  WBC 15.0* 12.9* 13.5* 11.6* 10.4  HGB 8.0* 8.2* 8.8* 7.7* 8.4*  HCT 26.0* 26.3* 28.8* 26.1* 26.9*  PLT 330 381 350 330 424*  MCV 94.9 97.0 96.3 98.9 97.5  MCH 29.2 30.3 29.4 29.2 30.4  MCHC 30.8 31.2 30.6 29.5* 31.2  RDW 17.0* 17.4* 18.4* 18.7* 18.9*  LYMPHSABS 2.8 2.2 2.2 2.1 2.3  MONOABS 0.6 0.6 0.6 0.8 0.7  EOSABS 0.5 0.4 0.2 0.3 0.3  BASOSABS 0.1 0.1 0.1 0.1 0.1    Chemistries  Recent Labs  Lab 04/14/20 0130 04/15/20 0215 04/16/20 0140 04/18/20 0150 04/19/20 0843  NA 142 143 145 145 145  K 3.5 4.2 4.4 4.5 4.5  CL 107 109 109 109 108  CO2 GLUCOSE 165* 179* 156* 191* 223*  BUN 35* 36* 37* 40* 40*  CREATININE 0.70 0.72 0.77 0.72 0.68  CALCIUM 8.5* 8.7* 9.1 9.0 9.3   ------------------------------------------------------------------------------------------------------------------ No results for input(s): CHOL, HDL, LDLCALC, TRIG, CHOLHDL, LDLDIRECT in the last 72 hours.  Lab Results  Component Value Date   HGBA1C 6.9 (H) 02/15/2020   ------------------------------------------------------------------------------------------------------------------ No results for input(s): TSH, T4TOTAL, T3FREE, THYROIDAB in the last 72 hours.  Invalid input(s): FREET3 ------------------------------------------------------------------------------------------------------------------ No results for input(s): VITAMINB12, FOLATE, FERRITIN, TIBC, IRON, RETICCTPCT in the last 72  hours.  Coagulation profile No results for input(s): INR, PROTIME in the last 168 hours.  No results for input(s): DDIMER in the last 72 hours.  Cardiac Enzymes No results for input(s): CKMB, TROPONINI, MYOGLOBIN in  the last 168 hours.  Invalid input(s): CK ------------------------------------------------------------------------------------------------------------------ No results found for: BNP  Micro Results No results found for this or any previous visit (from the past 240 hour(s)).  Radiology Reports CT ABDOMEN PELVIS WO CONTRAST  Result Date: 04/08/2020 CLINICAL DATA:  Fever, leukocytosis. Pneumonia, effusion or abscess suspected. EXAM: CT CHEST, ABDOMEN AND PELVIS WITHOUT CONTRAST TECHNIQUE: Multidetector CT imaging of the chest, abdomen and pelvis was performed following the standard protocol without IV contrast. COMPARISON:  CT abdomen dated 03/08/2020. FINDINGS: CT CHEST FINDINGS Cardiovascular: No thoracic aortic aneurysm. No pericardial effusion. Thoracic aortic atherosclerosis. Coronary artery calcifications, particularly dense within the LEFT anterior descending coronary artery. Mediastinum/Nodes: No mass or enlarged lymph nodes are seen within the mediastinum. Esophagus is unremarkable. Trachea appears normal. Lungs/Pleura: Dense consolidations at the bilateral lung bases, RIGHT greater than LEFT. Additional patchy ground-glass opacities within the peripheral portions of each lung, most prominent within the upper lobes and LEFT lower lobe. No pleural effusion is seen. No pneumothorax. Musculoskeletal: No acute or suspicious osseous finding. Masslike density within the outer LEFT breast, of uncertain significance. CT ABDOMEN PELVIS FINDINGS Hepatobiliary: No focal liver abnormality. Gallbladder is unremarkable. No bile duct dilatation. Pancreas: Unremarkable. No pancreatic ductal dilatation or surrounding inflammatory changes. Spleen: Normal in size without focal abnormality.  Adrenals/Urinary Tract: Adrenal glands appear normal. Kidneys are unremarkable without mass, stone or hydronephrosis. No perinephric fluid. Bladder is decompressed by Foley catheter. Stomach/Bowel: No dilated large or small bowel loops. Fluid, and associated air-fluid levels scattered throughout the large and small bowel, suggesting ileus. Diverticulosis of the sigmoid and descending colon but no focal inflammatory change to suggest acute diverticulitis. Gastrostomy tube appears appropriately positioned. Vascular/Lymphatic: Aortic atherosclerosis. No enlarged lymph nodes seen. Reproductive: No adnexal mass or free fluid Other: No free fluid or abscess collection is seen within the abdomen or pelvis. No free intraperitoneal air. Musculoskeletal: No acute or suspicious osseous finding. Surgical changes of kyphoplasty/vertebroplasty at the L2 vertebral body level. IMPRESSION: 1. Dense consolidations at the bilateral lung bases, RIGHT greater than LEFT, consistent with multifocal pneumonia and/or aspiration pneumonitis. 2. Additional patchy ground-glass opacities within the peripheral portions of each lung, most prominent within the upper lobes and LEFT lower lobe. Differential includes atypical pneumonias such as viral or fungal, interstitial pneumonias, edema related to volume overload/CHF, chronic interstitial diseases, hypersensitivity pneumonitis, and respiratory bronchiolitis. COVID-19 pneumonia can have this appearance. 3. Masslike density within the outer LEFT breast, of uncertain significance, neoplastic mass not excluded. Recommend correlation with diagnostic mammogram at a dedicated breast center after current issues are resolved. 4. Colonic diverticulosis without evidence of acute diverticulitis. 5. Fluid, and associated air-fluid levels, scattered throughout the large and small bowel, suggesting ileus. No evidence of bowel obstruction. 6. No free fluid or abscess collection is seen within the abdomen or  pelvis. No free intraperitoneal air. Aortic Atherosclerosis (ICD10-I70.0). Electronically Signed   By: Bary Richard M.D.   On: 04/08/2020 13:35   DG Abd 1 View  Result Date: 04/04/2020 CLINICAL DATA:  Abdominal distension, vomiting, fever EXAM: ABDOMEN - 1 VIEW COMPARISON:  03/19/2020 abdominal radiograph FINDINGS: Percutaneous gastrostomy tube terminates over the body of the stomach. Vertebroplasty changes again noted at L2. no disproportionately dilated small bowel loops. Moderate colorectal stool volume with dilation of the rectum to 8.9 cm diameter. No evidence of pneumatosis or pneumoperitoneum. Clear lung bases. IMPRESSION: Nonobstructive bowel gas pattern. Moderate colorectal stool volume, with dilation of the rectum to 8.9 cm, cannot exclude rectal fecal impaction. Electronically Signed  By: Delbert Phenix M.D.   On: 04/04/2020 12:56   CT CHEST WO CONTRAST  Result Date: 04/08/2020 CLINICAL DATA:  Fever, leukocytosis. Pneumonia, effusion or abscess suspected. EXAM: CT CHEST, ABDOMEN AND PELVIS WITHOUT CONTRAST TECHNIQUE: Multidetector CT imaging of the chest, abdomen and pelvis was performed following the standard protocol without IV contrast. COMPARISON:  CT abdomen dated 03/08/2020. FINDINGS: CT CHEST FINDINGS Cardiovascular: No thoracic aortic aneurysm. No pericardial effusion. Thoracic aortic atherosclerosis. Coronary artery calcifications, particularly dense within the LEFT anterior descending coronary artery. Mediastinum/Nodes: No mass or enlarged lymph nodes are seen within the mediastinum. Esophagus is unremarkable. Trachea appears normal. Lungs/Pleura: Dense consolidations at the bilateral lung bases, RIGHT greater than LEFT. Additional patchy ground-glass opacities within the peripheral portions of each lung, most prominent within the upper lobes and LEFT lower lobe. No pleural effusion is seen. No pneumothorax. Musculoskeletal: No acute or suspicious osseous finding. Masslike density within  the outer LEFT breast, of uncertain significance. CT ABDOMEN PELVIS FINDINGS Hepatobiliary: No focal liver abnormality. Gallbladder is unremarkable. No bile duct dilatation. Pancreas: Unremarkable. No pancreatic ductal dilatation or surrounding inflammatory changes. Spleen: Normal in size without focal abnormality. Adrenals/Urinary Tract: Adrenal glands appear normal. Kidneys are unremarkable without mass, stone or hydronephrosis. No perinephric fluid. Bladder is decompressed by Foley catheter. Stomach/Bowel: No dilated large or small bowel loops. Fluid, and associated air-fluid levels scattered throughout the large and small bowel, suggesting ileus. Diverticulosis of the sigmoid and descending colon but no focal inflammatory change to suggest acute diverticulitis. Gastrostomy tube appears appropriately positioned. Vascular/Lymphatic: Aortic atherosclerosis. No enlarged lymph nodes seen. Reproductive: No adnexal mass or free fluid Other: No free fluid or abscess collection is seen within the abdomen or pelvis. No free intraperitoneal air. Musculoskeletal: No acute or suspicious osseous finding. Surgical changes of kyphoplasty/vertebroplasty at the L2 vertebral body level. IMPRESSION: 1. Dense consolidations at the bilateral lung bases, RIGHT greater than LEFT, consistent with multifocal pneumonia and/or aspiration pneumonitis. 2. Additional patchy ground-glass opacities within the peripheral portions of each lung, most prominent within the upper lobes and LEFT lower lobe. Differential includes atypical pneumonias such as viral or fungal, interstitial pneumonias, edema related to volume overload/CHF, chronic interstitial diseases, hypersensitivity pneumonitis, and respiratory bronchiolitis. COVID-19 pneumonia can have this appearance. 3. Masslike density within the outer LEFT breast, of uncertain significance, neoplastic mass not excluded. Recommend correlation with diagnostic mammogram at a dedicated breast center  after current issues are resolved. 4. Colonic diverticulosis without evidence of acute diverticulitis. 5. Fluid, and associated air-fluid levels, scattered throughout the large and small bowel, suggesting ileus. No evidence of bowel obstruction. 6. No free fluid or abscess collection is seen within the abdomen or pelvis. No free intraperitoneal air. Aortic Atherosclerosis (ICD10-I70.0). Electronically Signed   By: Bary Richard M.D.   On: 04/08/2020 13:35   IR GASTROSTOMY TUBE MOD SED  Result Date: 03/31/2020 INDICATION: 74 year old female with a history of dysphagia EXAM: PERC PLACEMENT GASTROSTOMY MEDICATIONS: Vancomycin 1 gm IV; Antibiotics were administered within 1 hour of the procedure. ANESTHESIA/SEDATION: Versed 0.5 mg IV; Fentanyl 25 mcg IV Moderate Sedation Time:  10 minutes The patient was continuously monitored during the procedure by the interventional radiology nurse under my direct supervision. CONTRAST:  71mL OMNIPAQUE IOHEXOL 300 MG/ML SOLN - administered into the gastric lumen. FLUOROSCOPY TIME:  Fluoroscopy Time: 2 minutes 54 seconds (6 mGy). COMPLICATIONS: None PROCEDURE: Informed written consent was obtained from the patient and the patient's family after a thorough discussion of the procedural risks, benefits and  alternatives. All questions were addressed. Maximal Sterile Barrier Technique was utilized including caps, mask, sterile gowns, sterile gloves, sterile drape, hand hygiene and skin antiseptic. A timeout was performed prior to the initiation of the procedure. The epigastrium was prepped with Betadine in a sterile fashion, and a sterile drape was applied covering the operative field. A sterile gown and sterile gloves were used for the procedure. A 5-French orogastric tube is placed under fluoroscopic guidance. Scout imaging of the abdomen confirms barium within the transverse colon. The stomach was distended with gas. Under fluoroscopic guidance, an 18 gauge needle was utilized to  puncture the anterior wall of the body of the stomach. An Amplatz wire was advanced through the needle passing a T fastener into the lumen of the stomach. The T fastener was secured for gastropexy. A 9-French sheath was inserted. A snare was advanced through the 9-French sheath. A Teena Dunk was advanced through the orogastric tube. It was snared then pulled out the oral cavity, pulling the snare, as well. The leading edge of the gastrostomy was attached to the snare. It was then pulled down the esophagus and out the percutaneous site. Tube secured in place. Contrast was injected. Patient tolerated the procedure well and remained hemodynamically stable throughout. No complications were encountered and no significant blood loss encountered. IMPRESSION: Status post fluoroscopic placed percutaneous gastrostomy tube, with 20 [place] pull-through. Signed, Yvone Neu. Loreta Ave, DO [facility] Radiology Electronically Signed   By: Gilmer Mor D.O.   On: 03/31/2020 12:59   DG CHEST PORT 1 VIEW  Result Date: 04/04/2020 CLINICAL DATA:  Fever and vomiting. EXAM: PORTABLE CHEST 1 VIEW COMPARISON:  03/28/2020 FINDINGS: Feeding tube has been removed. Stable elevation of the RIGHT hemidiaphragm. There is minimal subsegmental atelectasis at the LEFT lung base. No consolidations or pleural effusions. No pulmonary edema. IMPRESSION: Minimal LEFT lower lobe atelectasis. Electronically Signed   By: Norva Pavlov M.D.   On: 04/04/2020 12:34   DG CHEST PORT 1 VIEW  Result Date: 03/28/2020 CLINICAL DATA:  74 year old with fever and vomiting. EXAM: PORTABLE CHEST 1 VIEW COMPARISON:  03/09/2020 and earlier. FINDINGS: Cardiac silhouette normal in size, unchanged. Thoracic aorta tortuous and atherosclerotic, unchanged. Chronic elevation of the RIGHT hemidiaphragm with associated scar/atelectasis at the RIGHT lung base. Calcified RIGHT hilar lymph nodes again noted as is the calcified granuloma  in the RIGHT lung apex. Lungs otherwise clear. No localized airspace consolidation. No pleural effusions. No pneumothorax. Normal pulmonary vascularity. IMPRESSION: 1. No acute cardiopulmonary disease. 2. Chronic elevation of the RIGHT hemidiaphragm with associated scar/atelectasis at the RIGHT lung base. 3. Old granulomatous disease. Electronically Signed   By: Hulan Saas M.D.   On: 03/28/2020 15:20     Time Spent in minutes  30     Laverna Peace M.D on 04/19/2020 at 3:43 PM  To page go to [email or web address] - password [facility]

## 2020-04-20 DIAGNOSIS — R1310 Dysphagia, unspecified: Secondary | ICD-10-CM

## 2020-04-20 DIAGNOSIS — I6300 Cerebral infarction due to thrombosis of unspecified precerebral artery: Secondary | ICD-10-CM

## 2020-04-20 LAB — GLUCOSE, CAPILLARY
Glucose-Capillary: 205 mg/dL — ABNORMAL HIGH (ref 70–99)
Glucose-Capillary: 188 mg/dL — ABNORMAL HIGH (ref 70–99)
Glucose-Capillary: 225 mg/dL — ABNORMAL HIGH (ref 70–99)
Glucose-Capillary: 196 mg/dL — ABNORMAL HIGH (ref 70–99)
Glucose-Capillary: 176 mg/dL — ABNORMAL HIGH (ref 70–99)
Glucose-Capillary: 176 mg/dL — ABNORMAL HIGH (ref 70–99)

## 2020-04-20 LAB — CBC WITH DIFFERENTIAL/PLATELET
Abs Immature Granulocytes: 0.18 10*3/uL — ABNORMAL HIGH (ref 0.00–0.07)
Basophils Absolute: 0.1 10*3/uL (ref 0.0–0.1)
Basophils Relative: 1 %
Eosinophils Absolute: 0.2 10*3/uL (ref 0.0–0.5)
Eosinophils Relative: 2 %
HCT: 28.9 % — ABNORMAL LOW (ref 36.0–46.0)
Hemoglobin: 8.6 g/dL — ABNORMAL LOW (ref 12.0–15.0)
Immature Granulocytes: 2 %
Lymphocytes Relative: 18 %
Lymphs Abs: 2.1 10*3/uL (ref 0.7–4.0)
MCH: 29.2 pg (ref 26.0–34.0)
MCHC: 29.8 g/dL — ABNORMAL LOW (ref 30.0–36.0)
MCV: 98 fL (ref 80.0–100.0)
Monocytes Absolute: 0.9 10*3/uL (ref 0.1–1.0)
Monocytes Relative: 8 %
Neutro Abs: 8.5 10*3/uL — ABNORMAL HIGH (ref 1.7–7.7)
Neutrophils Relative %: 69 %
Platelets: 385 10*3/uL (ref 150–400)
RBC: 2.95 MIL/uL — ABNORMAL LOW (ref 3.87–5.11)
RDW: 18.9 % — ABNORMAL HIGH (ref 11.5–15.5)
WBC: 12.1 10*3/uL — ABNORMAL HIGH (ref 4.0–10.5)
nRBC: 0 % (ref 0.0–0.2)

## 2020-04-20 LAB — BASIC METABOLIC PANEL
Anion gap: 10 (ref 5–15)
BUN: 43 mg/dL — ABNORMAL HIGH (ref 8–23)
CO2: 28 mmol/L (ref 22–32)
Calcium: 9.4 mg/dL (ref 8.9–10.3)
Chloride: 112 mmol/L — ABNORMAL HIGH (ref 98–111)
Creatinine, Ser: 0.67 mg/dL (ref 0.44–1.00)
GFR calc Af Amer: >60 mL/min (ref >60)
GFR calc non Af Amer: >60 mL/min (ref >60)
Glucose, Bld: 261 mg/dL — ABNORMAL HIGH (ref 70–99)
Potassium: 4.4 mmol/L (ref 3.5–5.1)
Sodium: 150 mmol/L — ABNORMAL HIGH (ref 135–145)

## 2020-04-20 NOTE — Progress Notes (Signed)
  Speech Language Pathology Treatment: Dysphagia  Patient Details Name: Tasha [name] MRN: [id number] DOB: 07/21/1946 Today's Date: 04/20/2020 Time: 2202-5427 SLP Time Calculation (min) (ACUTE ONLY): 11 min  Assessment / Plan / Recommendation Clinical Impression  Mentation continues to wax and wane with complexity of pts medical condition, limiting progress. Provided oral care and attempted single ice chip via tsp. As compared to prior session, lethargy limiting factor this date. Poor bolus manipulation, prompting removal via suction. No palpable swallow. If mentation and medical condition improve, pt may benefit from SLE to assist communication. SLP will continue to follow     HPI HPI: Tasha [name] is a 74 y.o. female with history of stroke in June, HTN, MI and DM presenting 7/12 with nausea and vomiting, altered mental status, admitted w/ uremia and possible infection. In hospital, R sided weakness not improving, MRI showed acute left pontine infarction. Acute small infarct along the posterior body of the right lateral ventricle. Seen for swallowing during admission 02/17/20 >nectar thick then upgraded to thin/full liquids. Currently s/p PEG tube for nutrition, palliative following      SLP Plan  Continue with current plan of care       Recommendations  Diet recommendations: NPO Medication Administration: Via alternative means                Oral Care Recommendations: Oral care QID Follow up Recommendations: Skilled Nursing facility;24 hour supervision/assistance SLP Visit Diagnosis: Dysphagia, oropharyngeal phase (R13.12) Plan: Continue with current plan of care       GO                Steven Veazie E Lillie Portner MA, CCC-SLP 04/20/2020, 4:40 PM

## 2020-04-20 NOTE — Progress Notes (Signed)
Daily Progress Note   Patient Name: Tasha [name]       Date: 04/20/2020 DOB: February 27, 1946  Age: 74 y.o. MRN#: [id number] Attending Physician: Laverna Peace, MD Primary Care Physician: Patient, No Pcp Per Admit Date: 03/08/2020  Reason for Consultation/Follow-up: To discuss complex medical decision making related to patient's goals of care  Chart reviewed. Discussed with TOC and attending MD.  Patient does not respond to my voice or light touch.  She does not open her eyes or seem to hear me.  VSS on 2L of oxygen.  Tolerating tube feeds via PEG at 40 ml/hour.  +urine in foley bag.  Abdomen soft.  Appears comfortable.  Slight continuous left arm tremor.  Reached out to daughter [name] but received a voice mail.   Subjective: Spoke with son Naisha Wisdom on the phone.  [name] feels he has a good understanding of his mothers condition.  He comments that Dr. Caleb Popp is answering all of his questions.  I explained that his mother has reached maximum benefit from this hospitalization.  She is stable and there are no further diagnostics to be done.  I asked about next steps.  [name] stated he thought his mother would be transferring to long term care at [facility] at some point.  We discussed providing extra support to his mother thru Hospice services at [facility].  Brad responded that that may be an option down the road.  I expressed that his mother is extremely fragile - and that it is only a matter of time before she has pneumonia again as she is unable to cough.   I expressed my fear that she would be re-hospitalized very quickly without Hospice support.    I asked [name] if he was able to get his mother's affairs in order as he stated he needed to do during our original conversation on 03/25/20.  Brad expressed  some frustration with a lack of POA and the processes attorneys were having to go thru.  I attempted to provide support during this stressful time for the [facility] family.   Assessment: Patient very fragile and debilitated.  On tube feeds but too weak to cough.  Unfortunately she is a set up for aspiration pneumonia or another infection.   Family not ready for Hospice care.      Patient Profile/HPI:  74 y.o. 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  She was hypothermic and in acute renal failure.  During the hospitalization she was found to have new acute infarcts on brain MRI.  Patient is unable to eat and is being fed artificially thru a cor trak tube.  Per physical therapy notes she is a "total assist" and very lethargic.   On my visit this morning patient wakes with some prodding, but does not speak or make eye contact.  Length of Stay: 43   Vital Signs: BP (!) 159/70 (BP Location: Left Arm)   Pulse 88   Temp 98.7 F (37.1 C) (Axillary)   Resp 18   Ht 5\' 4"  (1.626 m)   Wt 56.7 kg   SpO2 99%   BMI 21.46 kg/m  SpO2: SpO2: 99 % O2 Device: O2 Device: Nasal Cannula O2 Flow Rate: O2 Flow Rate (L/min): 2 L/min       Palliative Assessment/Data: 10%     Palliative Care Plan    Recommendations/Plan:  I believe family understands their mother's condition.  Their goals of care are set.  They want continued full scope treatment.   I don't anticipate that will change before has his mother's affairs in order.     Will join with attending team to offer family a detailed debriefing about their mother's current condition and anticipatory needs.  Otherwise -  If Ms. Rickenbach's medical condition changes perhaps there will be an opportunity for PMT to re-engage.  At this point I feel the next step will be to determine the appropriate time for DC to long term care and  ask TOC to engage in conversations with [facility] about long term care.  Code Status:  Full code  Prognosis:   < 3 months with full scope treatment.   Discharge Planning:  SNF with Palliative follow up.  Care plan was discussed with Attending MD, TOC, [facility]  Thank you for allowing the Palliative Medicine Team to assist in the care of this patient.  Total time spent:  35 min.     Greater than 50%  of this time was spent counseling and coordinating care related to the above assessment and plan.  Hanley Seamen, PA-C Palliative Medicine  Please contact Palliative MedicineTeam phone at [phone] for questions and concerns between 7 am - 7 pm.   Please see AMION for individual provider pager numbers.

## 2020-04-20 NOTE — Progress Notes (Signed)
[facility]  PROGRESS NOTE  Tasha [name] IFO:[id number] DOB: 08-27-1946 DOA: 03/08/2020 PCP: Patient, No Pcp Per Admit date - 03/08/2020   Admitting Physician A Grier Mitts., MD  Outpatient Primary MD for the patient is Patient, No Pcp Per  LOS - 43 Brief Narrative   Tasha [name] is a 74 y.o. year old female with medical history significant for type 2 diabetes, HTN, MI, recent CVA (01/2020) who presented on 03/08/2020 with changes in mental status noted at her facility, intractable nausea vomiting and was found to initially have AKI with lactic acidosis that improved with supportive care.  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  Hospital course complicated by severe dysphagia requiring initiation of tube feeds followed by PEG tube placement on 8/4 as well as empiric Zosyn for aspiration pneumonia. .  Palliative care was consulted to assist in goals of care discussion due to patient's poor prognosis in meaningful recovery  Subjective  No family at bedside today.  No acute events overnight. Resting in bed  A & P   Acute combined toxic/metabolic encephalopathy, multifactorial etiology including acute CVA, hypernatremia (currently resolved), hospital-acquired delirium, stable.  EEG unrevealing, B12/TSH within normal limits.  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  Based off MRI on 7/18.  In setting of recent CVA on 6/21.  Completed DAPT for 3 weeks.  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  Peak of 153, now down to 142 with IV fluids and free water.  Expect this to continue to fluctuate given patient unable to tolerate p.o. -Monitor BMP, not currently requiring fluids  HTN, stable -Continue amlodipine 10 mg, metoprolol 25 mg twice daily  AKI, resolved with IV fluids.  Suspect prerenal on admission.  Nephrology no longer following.  Type 2 diabetes, A1c 6.9, controlled. FBG in 200s-220s -Holding home Metformin -Continue to monitor CBGs if elevation persists can increase her Levemir, Levemir 10 units twice daily, sliding scale as needed   Acute urinary retention, resolved with Foley catheter placement 8/9 -Monitor output  Severe constipation -Fecal impaction on abdominal x-ray on 8/8, improved with optimization of bowel regimen -continue MiraLAX  Depression -Discontinued low-dose Remeron due to altered mentation  Left breast mass, seen on CT chest on 8/12 of uncertain significance -Pending goals of care discussion would need diagnostic mammogram as outpatient  Goals of care discussion.  Previous providers have had discussion with patient's son during her hospitalization and her poor prognosis.  I spoke with daughter Tasha [name] 08/18 and son by phone on 8/18, still considering when they would like to pursue comfort care measures.  -Appreciate  palliative care assistance, per son's conversation with palliative team seems to be leaning towards continuing full scope of  treatment and dispo to SNF --would like to arrange face to face meeting with myself and palliative and family to further discuss her poor prognosis and ultimate goals of care given she is medically stable       Family Communication  : Spoke with son Tasha [name] on phone on 8/23  Code Status : Full code  Disposition Plan  :  Patient is from home. Anticipated d/c date:  Medically stable, barriers to d/c or necessity for inpatient status:  Continue goals of care discussions with family, family currently wants full scope of treatment and SNF with palliative following as outpatient Consults  : Palliative  Procedures  :    DVT Prophylaxis  : Plavix  Lab Results  Component Value Date   PLT 385 04/20/2020    Diet :  Diet Order            Diet NPO time specified  Diet effective midnight                  Inpatient Medications Scheduled Meds: . amLODipine  10 mg Per Tube Daily  . chlorhexidine  15 mL Mouth Rinse BID  . Chlorhexidine Gluconate Cloth  6 each Topical Daily  . clopidogrel  75 mg Per Tube Daily  . feeding supplement (GLUCERNA 1.5 CAL)  960 mL Per Tube Q24H  . feeding supplement (PROSource TF)  45 mL Per Tube Daily  . heparin injection (subcutaneous)  5,000 Units Subcutaneous Q8H  . insulin aspart  0-9 Units Subcutaneous Q4H  . insulin detemir  10 Units Subcutaneous BID  . mouth rinse  15 mL Mouth Rinse BID  . metoprolol tartrate  25 mg Per Tube BID  . pantoprazole sodium  40 mg Per Tube Daily  . rosuvastatin  5 mg Per Tube Daily   Continuous Infusions: PRN Meds:.acetaminophen **OR** [DISCONTINUED] acetaminophen, lip balm, polyethylene glycol, Resource ThickenUp Clear  Antibiotics  :   Anti-infectives (From admission, onward)   Start     Dose/Rate Route Frequency Ordered Stop   04/05/20 1800  vancomycin (VANCOREADY) IVPB 750 mg/150 mL  Status:  Discontinued        750 mg 150 mL/hr over 60 Minutes Intravenous Every 24 hours 04/04/20 1727 04/07/20 0805   04/04/20  1730  vancomycin (VANCOREADY) IVPB 1250 mg/250 mL        1,250 mg 166.7 mL/hr over 90 Minutes Intravenous  Once 04/04/20 1727 04/04/20 1948   04/04/20 1730  piperacillin-tazobactam (ZOSYN) IVPB 3.375 g  Status:  Discontinued        3.375 g 12.5 mL/hr over 240 Minutes Intravenous Every 8 hours 04/04/20 1727 04/12/20 1003   04/04/20 1715  piperacillin-tazobactam (ZOSYN) IVPB 3.375 g  Status:  Discontinued       Note to Pharmacy: Adjust as per renal function   3.375 g 100 mL/hr over 30 Minutes Intravenous Every 8 hours 04/04/20 1706 04/04/20 1726   03/31/20 1207  vancomycin (VANCOCIN) 1-5 GM/200ML-% IVPB       Note to Pharmacy: Erie Noe   : cabinet override      03/31/20 1207 03/31/20 1232   03/31/20 1200  vancomycin (VANCOCIN) IVPB 1000 mg/200 mL premix        1,000 mg 200 mL/hr over 60 Minutes Intravenous  Once 03/31/20 1155 03/31/20 1509   03/10/20 1200  cefTRIAXone (ROCEPHIN) 2 g in sodium chloride 0.9 % 100  mL IVPB        2 g 200 mL/hr over 30 Minutes Intravenous Every 24 hours 03/10/20 0924 03/14/20 1333   03/09/20 1000  ceFEPIme (MAXIPIME) 1 g in sodium chloride 0.9 % 100 mL IVPB  Status:  Discontinued        1 g 200 mL/hr over 30 Minutes Intravenous Every 24 hours 03/08/20 1219 03/10/20 0924   03/08/20 1219  vancomycin variable dose per unstable renal function (pharmacist dosing)  Status:  Discontinued         Does not apply See admin instructions 03/08/20 1219 03/10/20 0906   03/08/20 1100  vancomycin (VANCOCIN) IVPB 1000 mg/200 mL premix        1,000 mg 200 mL/hr over 60 Minutes Intravenous  Once 03/08/20 1046 03/08/20 1406   03/08/20 1100  ceFEPIme (MAXIPIME) 2 g in sodium chloride 0.9 % 100 mL IVPB        2 g 200 mL/hr over 30 Minutes Intravenous  Once 03/08/20 1046 03/08/20 1211       Objective   Vitals:   04/19/20 2141 04/19/20 2310 04/20/20 0311 04/20/20 0736  BP: (!) 153/73 (!) 154/61 (!) 144/60 (!) 159/70  Pulse: 92 72 75 88  Resp:  18 18 18   Temp:  99 F  (37.2 C) 98.7 F (37.1 C) 98.7 F (37.1 C)  TempSrc:  Axillary Axillary Axillary  SpO2:  100% 100% 99%  Weight:   56.7 kg   Height:        SpO2: 99 % O2 Flow Rate (L/min): 2 L/min  Wt Readings from Last 3 Encounters:  04/20/20 56.7 kg  02/24/20 62.1 kg  02/20/20 66.8 kg     Intake/Output Summary (Last 24 hours) at 04/20/2020 1108 Last data filed at 04/20/2020 0510 Gross per 24 hour  Intake 2686 ml  Output 1750 ml  Net 936 ml    Physical Exam:    Lying in bed comfortably,   today she was able to slowly say hi upon me greeting her and slowly open her eyes. She could state her son's name is Tasha BoatmanBrad and when asked if she is in pain stated no  not able to move any of her extremities or follow any simple commands Normal respiratory effort on 2 L, lungs clear bilaterally, distant breath sounds Abdomen soft, non-distended, feeding tube in place No peripheral edema Regular rate and rhythm   I have personally reviewed the following:   Data Reviewed:  CBC Recent Labs  Lab 04/15/20 0215 04/16/20 0140 04/18/20 0150 04/19/20 0155 04/20/20 0256  WBC 12.9* 13.5* 11.6* 10.4 12.1*  HGB 8.2* 8.8* 7.7* 8.4* 8.6*  HCT 26.3* 28.8* 26.1* 26.9* 28.9*  PLT 381 350 330 424* 385  MCV 97.0 96.3 98.9 97.5 98.0  MCH 30.3 29.4 29.2 30.4 29.2  MCHC 31.2 30.6 29.5* 31.2 29.8*  RDW 17.4* 18.4* 18.7* 18.9* 18.9*  LYMPHSABS 2.2 2.2 2.1 2.3 2.1  MONOABS 0.6 0.6 0.8 0.7 0.9  EOSABS 0.4 0.2 0.3 0.3 0.2  BASOSABS 0.1 0.1 0.1 0.1 0.1    Chemistries  Recent Labs  Lab 04/14/20 0130 04/15/20 0215 04/16/20 0140 04/18/20 0150 04/19/20 0843  NA 142 143 145 145 145  K 3.5 4.2 4.4 4.5 4.5  CL 107 109 109 109 108  CO2 25 24 26 27 25   GLUCOSE 165* 179* 156* 191* 223*  BUN 35* 36* 37* 40* 40*  CREATININE 0.70 0.72 0.77 0.72 0.68  CALCIUM 8.5* 8.7* 9.1 9.0 9.3   ------------------------------------------------------------------------------------------------------------------  No results for  input(s): CHOL, HDL, LDLCALC, TRIG, CHOLHDL, LDLDIRECT in the last 72 hours.  Lab Results  Component Value Date   HGBA1C 6.9 (H) 02/15/2020   ------------------------------------------------------------------------------------------------------------------ No results for input(s): TSH, T4TOTAL, T3FREE, THYROIDAB in the last 72 hours.  Invalid input(s): FREET3 ------------------------------------------------------------------------------------------------------------------ No results for input(s): VITAMINB12, FOLATE, FERRITIN, TIBC, IRON, RETICCTPCT in the last 72 hours.  Coagulation profile No results for input(s): INR, PROTIME in the last 168 hours.  No results for input(s): DDIMER in the last 72 hours.  Cardiac Enzymes No results for input(s): CKMB, TROPONINI, MYOGLOBIN in the last 168 hours.  Invalid input(s): CK ------------------------------------------------------------------------------------------------------------------ No results found for: BNP  Micro Results No results found for this or any previous visit (from the past 240 hour(s)).  Radiology Reports CT ABDOMEN PELVIS WO CONTRAST  Result Date: 04/08/2020 CLINICAL DATA:  Fever, leukocytosis. Pneumonia, effusion or abscess suspected. EXAM: CT CHEST, ABDOMEN AND PELVIS WITHOUT CONTRAST TECHNIQUE: Multidetector CT imaging of the chest, abdomen and pelvis was performed following the standard protocol without IV contrast. COMPARISON:  CT abdomen dated 03/08/2020. FINDINGS: CT CHEST FINDINGS Cardiovascular: No thoracic aortic aneurysm. No pericardial effusion. Thoracic aortic atherosclerosis. Coronary artery calcifications, particularly dense within the LEFT anterior descending coronary artery. Mediastinum/Nodes: No mass or enlarged lymph nodes are seen within the mediastinum. Esophagus is unremarkable. Trachea appears normal. Lungs/Pleura: Dense consolidations at the bilateral lung bases, RIGHT greater than LEFT. Additional  patchy ground-glass opacities within the peripheral portions of each lung, most prominent within the upper lobes and LEFT lower lobe. No pleural effusion is seen. No pneumothorax. Musculoskeletal: No acute or suspicious osseous finding. Masslike density within the outer LEFT breast, of uncertain significance. CT ABDOMEN PELVIS FINDINGS Hepatobiliary: No focal liver abnormality. Gallbladder is unremarkable. No bile duct dilatation. Pancreas: Unremarkable. No pancreatic ductal dilatation or surrounding inflammatory changes. Spleen: Normal in size without focal abnormality. Adrenals/Urinary Tract: Adrenal glands appear normal. Kidneys are unremarkable without mass, stone or hydronephrosis. No perinephric fluid. Bladder is decompressed by Foley catheter. Stomach/Bowel: No dilated large or small bowel loops. Fluid, and associated air-fluid levels scattered throughout the large and small bowel, suggesting ileus. Diverticulosis of the sigmoid and descending colon but no focal inflammatory change to suggest acute diverticulitis. Gastrostomy tube appears appropriately positioned. Vascular/Lymphatic: Aortic atherosclerosis. No enlarged lymph nodes seen. Reproductive: No adnexal mass or free fluid Other: No free fluid or abscess collection is seen within the abdomen or pelvis. No free intraperitoneal air. Musculoskeletal: No acute or suspicious osseous finding. Surgical changes of kyphoplasty/vertebroplasty at the L2 vertebral body level. IMPRESSION: 1. Dense consolidations at the bilateral lung bases, RIGHT greater than LEFT, consistent with multifocal pneumonia and/or aspiration pneumonitis. 2. Additional patchy ground-glass opacities within the peripheral portions of each lung, most prominent within the upper lobes and LEFT lower lobe. Differential includes atypical pneumonias such as viral or fungal, interstitial pneumonias, edema related to volume overload/CHF, chronic interstitial diseases, hypersensitivity pneumonitis,  and respiratory bronchiolitis. COVID-19 pneumonia can have this appearance. 3. Masslike density within the outer LEFT breast, of uncertain significance, neoplastic mass not excluded. Recommend correlation with diagnostic mammogram at a dedicated breast center after current issues are resolved. 4. Colonic diverticulosis without evidence of acute diverticulitis. 5. Fluid, and associated air-fluid levels, scattered throughout the large and small bowel, suggesting ileus. No evidence of bowel obstruction. 6. No free fluid or abscess collection is seen within the abdomen or pelvis. No free intraperitoneal air. Aortic Atherosclerosis (ICD10-I70.0). Electronically Signed   By: Anne Ng.D.  On: 04/08/2020 13:35   DG Abd 1 View  Result Date: 04/04/2020 CLINICAL DATA:  Abdominal distension, vomiting, fever EXAM: ABDOMEN - 1 VIEW COMPARISON:  03/19/2020 abdominal radiograph FINDINGS: Percutaneous gastrostomy tube terminates over the body of the stomach. Vertebroplasty changes again noted at L2. no disproportionately dilated small bowel loops. Moderate colorectal stool volume with dilation of the rectum to 8.9 cm diameter. No evidence of pneumatosis or pneumoperitoneum. Clear lung bases. IMPRESSION: Nonobstructive bowel gas pattern. Moderate colorectal stool volume, with dilation of the rectum to 8.9 cm, cannot exclude rectal fecal impaction. Electronically Signed   By: Delbert Phenix M.D.   On: 04/04/2020 12:56   CT CHEST WO CONTRAST  Result Date: 04/08/2020 CLINICAL DATA:  Fever, leukocytosis. Pneumonia, effusion or abscess suspected. EXAM: CT CHEST, ABDOMEN AND PELVIS WITHOUT CONTRAST TECHNIQUE: Multidetector CT imaging of the chest, abdomen and pelvis was performed following the standard protocol without IV contrast. COMPARISON:  CT abdomen dated 03/08/2020. FINDINGS: CT CHEST FINDINGS Cardiovascular: No thoracic aortic aneurysm. No pericardial effusion. Thoracic aortic atherosclerosis. Coronary artery  calcifications, particularly dense within the LEFT anterior descending coronary artery. Mediastinum/Nodes: No mass or enlarged lymph nodes are seen within the mediastinum. Esophagus is unremarkable. Trachea appears normal. Lungs/Pleura: Dense consolidations at the bilateral lung bases, RIGHT greater than LEFT. Additional patchy ground-glass opacities within the peripheral portions of each lung, most prominent within the upper lobes and LEFT lower lobe. No pleural effusion is seen. No pneumothorax. Musculoskeletal: No acute or suspicious osseous finding. Masslike density within the outer LEFT breast, of uncertain significance. CT ABDOMEN PELVIS FINDINGS Hepatobiliary: No focal liver abnormality. Gallbladder is unremarkable. No bile duct dilatation. Pancreas: Unremarkable. No pancreatic ductal dilatation or surrounding inflammatory changes. Spleen: Normal in size without focal abnormality. Adrenals/Urinary Tract: Adrenal glands appear normal. Kidneys are unremarkable without mass, stone or hydronephrosis. No perinephric fluid. Bladder is decompressed by Foley catheter. Stomach/Bowel: No dilated large or small bowel loops. Fluid, and associated air-fluid levels scattered throughout the large and small bowel, suggesting ileus. Diverticulosis of the sigmoid and descending colon but no focal inflammatory change to suggest acute diverticulitis. Gastrostomy tube appears appropriately positioned. Vascular/Lymphatic: Aortic atherosclerosis. No enlarged lymph nodes seen. Reproductive: No adnexal mass or free fluid Other: No free fluid or abscess collection is seen within the abdomen or pelvis. No free intraperitoneal air. Musculoskeletal: No acute or suspicious osseous finding. Surgical changes of kyphoplasty/vertebroplasty at the L2 vertebral body level. IMPRESSION: 1. Dense consolidations at the bilateral lung bases, RIGHT greater than LEFT, consistent with multifocal pneumonia and/or aspiration pneumonitis. 2. Additional  patchy ground-glass opacities within the peripheral portions of each lung, most prominent within the upper lobes and LEFT lower lobe. Differential includes atypical pneumonias such as viral or fungal, interstitial pneumonias, edema related to volume overload/CHF, chronic interstitial diseases, hypersensitivity pneumonitis, and respiratory bronchiolitis. COVID-19 pneumonia can have this appearance. 3. Masslike density within the outer LEFT breast, of uncertain significance, neoplastic mass not excluded. Recommend correlation with diagnostic mammogram at a dedicated breast center after current issues are resolved. 4. Colonic diverticulosis without evidence of acute diverticulitis. 5. Fluid, and associated air-fluid levels, scattered throughout the large and small bowel, suggesting ileus. No evidence of bowel obstruction. 6. No free fluid or abscess collection is seen within the abdomen or pelvis. No free intraperitoneal air. Aortic Atherosclerosis (ICD10-I70.0). Electronically Signed   By: Bary Richard M.D.   On: 04/08/2020 13:35   IR GASTROSTOMY TUBE MOD SED  Result Date: 03/31/2020 INDICATION: 74 year old female with a history of  dysphagia EXAM: PERC PLACEMENT GASTROSTOMY MEDICATIONS: Vancomycin 1 gm IV; Antibiotics were administered within 1 hour of the procedure. ANESTHESIA/SEDATION: Versed 0.5 mg IV; Fentanyl 25 mcg IV Moderate Sedation Time:  10 minutes The patient was continuously monitored during the procedure by the interventional radiology nurse under my direct supervision. CONTRAST:  15mL OMNIPAQUE IOHEXOL 300 MG/ML SOLN - administered into the gastric lumen. FLUOROSCOPY TIME:  Fluoroscopy Time: 2 minutes 54 seconds (6 mGy). COMPLICATIONS: None PROCEDURE: Informed written consent was obtained from the patient and the patient's family after a thorough discussion of the procedural risks, benefits and alternatives. All questions were addressed. Maximal Sterile Barrier Technique was utilized including caps,  mask, sterile gowns, sterile gloves, sterile drape, hand hygiene and skin antiseptic. A timeout was performed prior to the initiation of the procedure. The epigastrium was prepped with Betadine in a sterile fashion, and a sterile drape was applied covering the operative field. A sterile gown and sterile gloves were used for the procedure. A 5-French orogastric tube is placed under fluoroscopic guidance. Scout imaging of the abdomen confirms barium within the transverse colon. The stomach was distended with gas. Under fluoroscopic guidance, an 18 gauge needle was utilized to puncture the anterior wall of the body of the stomach. An Amplatz wire was advanced through the needle passing a T fastener into the lumen of the stomach. The T fastener was secured for gastropexy. A 9-French sheath was inserted. A snare was advanced through the 9-French sheath. A Teena Dunk was advanced through the orogastric tube. It was snared then pulled out the oral cavity, pulling the snare, as well. The leading edge of the gastrostomy was attached to the snare. It was then pulled down the esophagus and out the percutaneous site. Tube secured in place. Contrast was injected. Patient tolerated the procedure well and remained hemodynamically stable throughout. No complications were encountered and no significant blood loss encountered. IMPRESSION: Status post fluoroscopic placed percutaneous gastrostomy tube, with 20 [place] pull-through. Signed, Yvone Neu. Loreta Ave, DO [facility] Electronically Signed   By: Gilmer Mor D.O.   On: 03/31/2020 12:59   DG CHEST PORT 1 VIEW  Result Date: 04/04/2020 CLINICAL DATA:  Fever and vomiting. EXAM: PORTABLE CHEST 1 VIEW COMPARISON:  03/28/2020 FINDINGS: Feeding tube has been removed. Stable elevation of the RIGHT hemidiaphragm. There is minimal subsegmental atelectasis at the LEFT lung base. No consolidations or pleural effusions. No pulmonary edema.  IMPRESSION: Minimal LEFT lower lobe atelectasis. Electronically Signed   By: Norva Pavlov M.D.   On: 04/04/2020 12:34   DG CHEST PORT 1 VIEW  Result Date: 03/28/2020 CLINICAL DATA:  74 year old with fever and vomiting. EXAM: PORTABLE CHEST 1 VIEW COMPARISON:  03/09/2020 and earlier. FINDINGS: Cardiac silhouette normal in size, unchanged. Thoracic aorta tortuous and atherosclerotic, unchanged. Chronic elevation of the RIGHT hemidiaphragm with associated scar/atelectasis at the RIGHT lung base. Calcified RIGHT hilar lymph nodes again noted as is the calcified granuloma in the RIGHT lung apex. Lungs otherwise clear. No localized airspace consolidation. No pleural effusions. No pneumothorax. Normal pulmonary vascularity. IMPRESSION: 1. No acute cardiopulmonary disease. 2. Chronic elevation of the RIGHT hemidiaphragm with associated scar/atelectasis at the RIGHT lung base. 3. Old granulomatous disease. Electronically Signed   By: Hulan Saas M.D.   On: 03/28/2020 15:20     Time Spent in minutes  30     Laverna Peace M.D on 04/20/2020 at 11:08 AM  To page go to [email or web address] - password [email or web address]

## 2020-04-21 ENCOUNTER — Inpatient Hospital Stay (HOSPITAL_COMMUNITY): Payer: Medicare Other

## 2020-04-21 DIAGNOSIS — R509 Fever, unspecified: Secondary | ICD-10-CM

## 2020-04-21 LAB — GLUCOSE, CAPILLARY
Glucose-Capillary: 179 mg/dL — ABNORMAL HIGH (ref 70–99)
Glucose-Capillary: 180 mg/dL — ABNORMAL HIGH (ref 70–99)
Glucose-Capillary: 184 mg/dL — ABNORMAL HIGH (ref 70–99)
Glucose-Capillary: 206 mg/dL — ABNORMAL HIGH (ref 70–99)
Glucose-Capillary: 208 mg/dL — ABNORMAL HIGH (ref 70–99)
Glucose-Capillary: 211 mg/dL — ABNORMAL HIGH (ref 70–99)
Glucose-Capillary: 212 mg/dL — ABNORMAL HIGH (ref 70–99)
Glucose-Capillary: 232 mg/dL — ABNORMAL HIGH (ref 70–99)

## 2020-04-21 LAB — BASIC METABOLIC PANEL
Anion gap: 11 (ref 5–15)
BUN: 50 mg/dL — ABNORMAL HIGH (ref 8–23)
CO2: 26 mmol/L (ref 22–32)
Calcium: 9.1 mg/dL (ref 8.9–10.3)
Chloride: 113 mmol/L — ABNORMAL HIGH (ref 98–111)
Creatinine, Ser: 0.8 mg/dL (ref 0.44–1.00)
GFR calc Af Amer: >60 mL/min (ref >60)
GFR calc non Af Amer: >60 mL/min (ref >60)
Glucose, Bld: 232 mg/dL — ABNORMAL HIGH (ref 70–99)
Potassium: 4.2 mmol/L (ref 3.5–5.1)
Sodium: 150 mmol/L — ABNORMAL HIGH (ref 135–145)

## 2020-04-21 IMAGING — DX DG CHEST 1V PORT
1 series · 1 of 1 positions shown · non-contrast
Comparison: [DATE]

CLINICAL DATA: Fever and hypertension

EXAM:
PORTABLE CHEST 1 VIEW

[chest ap]
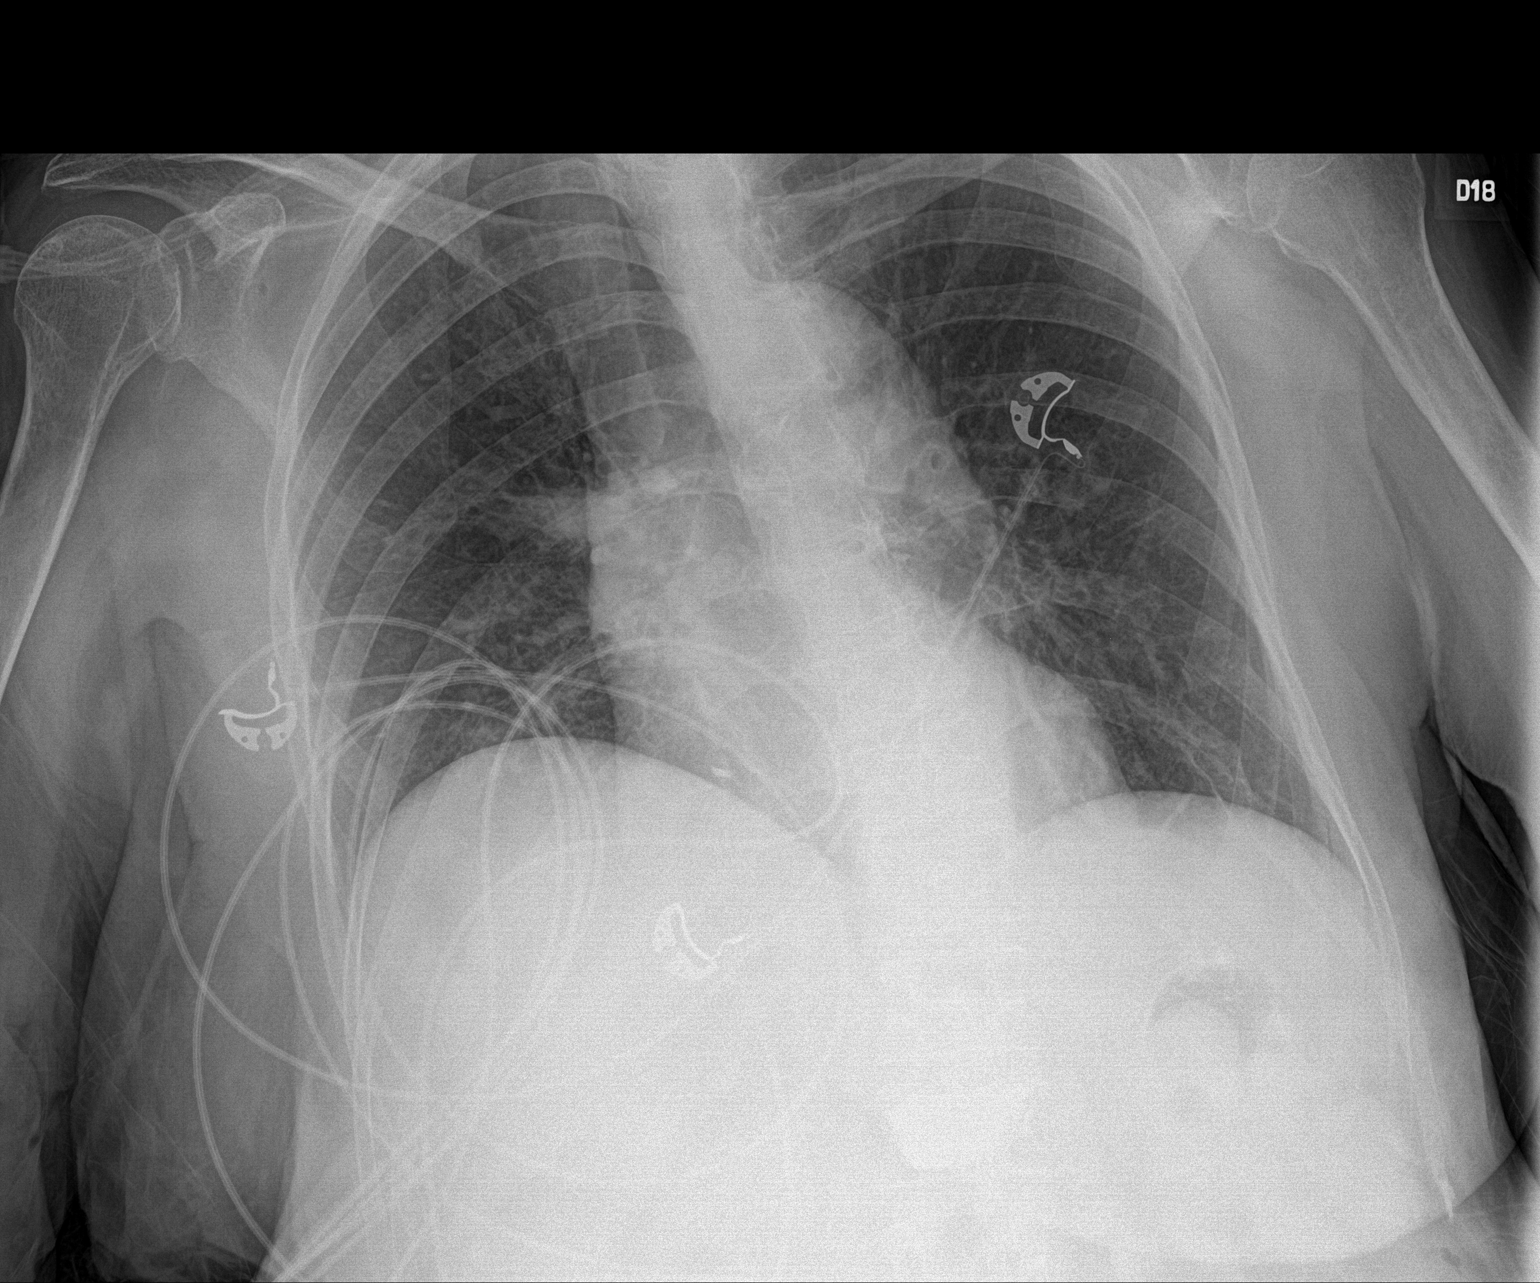

[1 of 1 positions shown; findings below may reference images not displayed]

FINDINGS: There is ill-defined airspace opacity in the right perihilar region.
There is a small granuloma in the right apex. Lungs elsewhere clear.
Heart size and pulmonary vascularity are normal. No adenopathy. No
bone lesions.
IMPRESSION: Ill-defined opacity concerning for pneumonia in the right perihilar
region. Small granuloma right apex. Lungs elsewhere clear. Cardiac
silhouette within normal limits. No adenopathy.

## 2020-04-21 MED ORDER — FREE WATER
200.0000 mL | Status: DC
  Administered 2020-04-21 – 2020-04-27 (×37): 200 mL

## 2020-04-21 MED ORDER — SODIUM CHLORIDE 0.9 % IV SOLN
1.5000 g | Freq: Three times a day (TID) | INTRAVENOUS | Status: DC
  Administered 2020-04-21 – 2020-04-26 (×15): 1.5 g via INTRAVENOUS
  Filled 2020-04-21 (×2): qty 4
  Filled 2020-04-21: qty 1.5
  Filled 2020-04-21 (×4): qty 4
  Filled 2020-04-21: qty 1.5
  Filled 2020-04-21: qty 4
  Filled 2020-04-21: qty 1.5
  Filled 2020-04-21 (×2): qty 4
  Filled 2020-04-21: qty 1.5
  Filled 2020-04-21 (×2): qty 4
  Filled 2020-04-21: qty 1.5
  Filled 2020-04-21: qty 4

## 2020-04-21 MED ORDER — FREE WATER: 200.0000 mL | Freq: Three times a day (TID) | Status: DC

## 2020-04-21 MED ORDER — DEXTROSE 5 % IV SOLN: INTRAVENOUS | Status: DC

## 2020-04-21 NOTE — Progress Notes (Signed)
  Speech Language Pathology Treatment: Dysphagia  Patient Details Name: Tasha [name] MRN: [id number] DOB: 01/27/1946 Today's Date: 04/21/2020 Time: 4132-4401 SLP Time Calculation (min) (ACUTE ONLY): 11 min  Assessment / Plan / Recommendation Clinical Impression  Pt was seen for dysphagia treatment. She was adequately alert for the session but did not communicate verbally. She required cues to close her mouth and was inconsistently responsive to cueing; however, she exhibited difficulty maintaining a closed-mouth posture. Oral care was provided. Bolus manipulation and awareness were absent despite verbal and tactile cues and boluses were ultimately removed from the pt's oral cavity with oral swabs and suction. It is recommended that the pt's NPO status be maintained. SLP will continue to follow pt but prognosis for diet advancement is judged to be poor at this time.    HPI HPI: Tasha [name] is a 74 y.o. female with history of stroke in June, HTN, MI and DM presenting 7/12 with nausea and vomiting, altered mental status, admitted w/ uremia and possible infection. In hospital, R sided weakness not improving, MRI showed acute left pontine infarction. Acute small infarct along the posterior body of the right lateral ventricle. Seen for swallowing during admission 02/17/20 >nectar thick then upgraded to thin/full liquids. Currently s/p PEG tube for nutrition, palliative following      SLP Plan  Continue with current plan of care       Recommendations  Diet recommendations: NPO Medication Administration: Via alternative means                Oral Care Recommendations: Oral care QID Follow up Recommendations: Skilled Nursing facility;24 hour supervision/assistance SLP Visit Diagnosis: Dysphagia, oropharyngeal phase (R13.12) Plan: Continue with current plan of care       Rockwell Zentz I. Vear Clock, MS, CCC-SLP Acute Rehabilitation Services Office number [phone] Pager [phone]                 Scheryl Marten 04/21/2020, 4:24 PM

## 2020-04-21 NOTE — Progress Notes (Addendum)
PROGRESS NOTE    Tasha [name]  YPP:[id number] DOB: [birth year] DOA: 03/08/2020 PCP: Patient, No Pcp Per    Chief Complaint  Patient presents with  . Emesis    Brief Narrative:   74 year old lady with prior history of type 2 diabetes, hypertension, MI, recent CVA in June 2021 presents in July fall 2021 with acute encephalopathy, intractable nausea and vomiting and was found to have AKI with lactic acidosis that improved with supportive care.  MRI of the brain subsequently on 03/14/2020 showed acute left pontine infarct and acute small infarct in the posterior right lateral ventricle.  Hospital course was complicated by severe dysphagia requiring initiation of tube feeds followed by PEG placement on 03/31/2020 as well as Zosyn for aspiration pneumonia.  Palliative care was consulted to assist in goals of care discussion due to poor patient's poor prognosis and meaningful recovery. Overnight patient had fever of 100.7.  This morning she is unresponsive and is not following commands with verbal touch.   Assessment & Plan:   Active Problems:   CVA (cerebral vascular accident) (HCC)   DM (diabetes mellitus), type 2 with peripheral vascular complications (HCC)   Essential (primary) hypertension   Lactic acidosis   Nausea and vomiting   AKI (acute kidney injury) (HCC)   Hyperkalemia   Sepsis (HCC)   Coffee ground emesis   Palliative care encounter   Pneumonia of both lungs due to infectious organism   Advanced care planning/counseling discussion   Goals of care, counseling/discussion   Palliative care by specialist   Dysphagia    Acute combined toxic and metabolic encephalopathy Multifactorial probably combination of acute CVA, hyponatremia, hospital-acquired delirium. EEG is unrevealing. Vitamin B12 and TSH within normal limits.    Hypernatremia Gentle hydration with dextrose infusion at 50 mL/h and free water boluses via PEG tube. Repeat BMP tomorrow.    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 Patient currently unresponsive. Patient currently on Plavix and Crestor as recommended by neurology for prophylaxis.    Severe bilateral aspiration pneumonia in the setting of severe dysphagia Patient completed 9-day course of IV Zosyn.  Palliative care consulted for goals of care discussion.  Patient currently has PEG tube and has tube feeds running.   Tension hypertension Blood pressure parameters are optimal Continue with amlodipine and metoprolol.   AKI Resolved with IV fluids.    Type 2 diabetes mellitus Hemoglobin A1c is 6.9 CBG (last 3)  Recent Labs    04/21/20 0306 04/21/20 0736 04/21/20 1129  GLUCAP 179* 206* 211*      Depression Continue with low-dose Remeron.      Left breast mass seen on CT of the chest on 04/08/2020. Pending goals of care discussion.  Patient will need diagnostic mammogram as an outpatient.    Acute urinary retention Foley catheter placed on 04/05/2020.  Poor progression, multiple comorbidities, clinical deterioration, recurrent aspiration pneumonias, dysphagia, palliative care consulted for goals of care discussion.   DVT prophylaxis: (Heparin/) Code Status: full code.  Family Communication: none at bedside.  Disposition:   Status is: Inpatient  Remains inpatient appropriate because:IV treatments appropriate due to intensity of illness or inability to take PO   Dispo:  Patient From:  home.   Planned Disposition:  SNF  Expected discharge date:   > 3 DAYS.   Medically stable for discharge:  NO         Consultants:   Palliative care.  Procedures: none.   Antimicrobials: Anti-infectives (From admission, onward)   Start     Dose/Rate Route Frequency Ordered Stop   04/05/20 1800  vancomycin (VANCOREADY) IVPB 750 mg/150 mL  Status:  Discontinued        750 mg 150 mL/hr over 60 Minutes Intravenous Every 24  hours 04/04/20 1727 04/07/20 0805   04/04/20 1730  vancomycin (VANCOREADY) IVPB 1250 mg/250 mL        1,250 mg 166.7 mL/hr over 90 Minutes Intravenous  Once 04/04/20 1727 04/04/20 1948   04/04/20 1730  piperacillin-tazobactam (ZOSYN) IVPB 3.375 g  Status:  Discontinued        3.375 g 12.5 mL/hr over 240 Minutes Intravenous Every 8 hours 04/04/20 1727 04/12/20 1003   04/04/20 1715  piperacillin-tazobactam (ZOSYN) IVPB 3.375 g  Status:  Discontinued       Note to Pharmacy: Adjust as per renal function   3.375 g 100 mL/hr over 30 Minutes Intravenous Every 8 hours 04/04/20 1706 04/04/20 1726   03/31/20 1207  vancomycin (VANCOCIN) 1-5 GM/200ML-% IVPB       Note to Pharmacy: Erie Noe   : cabinet override      03/31/20 1207 03/31/20 1232   03/31/20 1200  vancomycin (VANCOCIN) IVPB 1000 mg/200 mL premix        1,000 mg 200 mL/hr over 60 Minutes Intravenous  Once 03/31/20 1155 03/31/20 1509   03/10/20 1200  cefTRIAXone (ROCEPHIN) 2 g in sodium chloride 0.9 % 100 mL IVPB        2 g 200 mL/hr over 30 Minutes Intravenous Every 24 hours 03/10/20 0924 03/14/20 1333   03/09/20 1000  ceFEPIme (MAXIPIME) 1 g in sodium chloride 0.9 % 100 mL IVPB  Status:  Discontinued        1 g 200 mL/hr over 30 Minutes Intravenous Every 24 hours 03/08/20 1219 03/10/20 0924   03/08/20 1219  vancomycin variable dose per unstable renal function (pharmacist dosing)  Status:  Discontinued         Does not apply See admin instructions 03/08/20 1219 03/10/20 0906   03/08/20 1100  vancomycin (VANCOCIN) IVPB 1000 mg/200 mL premix        1,000 mg 200 mL/hr over 60 Minutes Intravenous  Once 03/08/20 1046 03/08/20 1406   03/08/20 1100  ceFEPIme (MAXIPIME) 2 g in sodium chloride 0.9 % 100 mL IVPB        2 g 200 mL/hr over 30 Minutes Intravenous  Once 03/08/20 1046 03/08/20 1211          Subjective: Pt not responding to verbal cues.   Objective: Vitals:   04/21/20 0301 04/21/20 0316 04/21/20 0718 04/21/20 1124    BP: (!) 120/59  129/61 (!) 145/66  Pulse: 86  95 (!) 101  Resp: 18  18   Temp: 99.3 F (37.4 C)  (!) 100.7 F (38.2 C) 99.3 F (37.4 C)  TempSrc: Axillary  Axillary Axillary  SpO2: 99%  99% 100%  Weight:  35.8 kg    Height:        Intake/Output Summary (Last 24 hours) at 04/21/2020 1449 Last data filed at 04/21/2020 0800 Gross per 24 hour  Intake 1285 ml  Output 1225 ml  Net 60 ml   Filed Weights   04/19/20 0317 04/20/20 0311 04/21/20 0316  Weight: 69 kg 56.7 kg 35.8 kg    Examination:  General exam: cachetic looking lady sleeping with her mouth open, unresponsive.  Respiratory system: diminished at bases.  Cardiovascular  system: S1 & S2 heard, RRR. No JVD,No pedal edema. Gastrointestinal system: Abdomen is nondistended, soft and nontender. Normal bowel sounds heard. Central nervous system: unresponsive, did not open eyes to verbal cues or touch.  Extremities: no edema.  Skin: No rashes Psychiatry:  Cannot be assessed.     Data Reviewed: I have personally reviewed following labs and imaging studies  CBC: Recent Labs  Lab 04/15/20 0215 04/16/20 0140 04/18/20 0150 04/19/20 0155 04/20/20 0256  WBC 12.9* 13.5* 11.6* 10.4 12.1*  NEUTROABS 9.4* 10.0* 8.2* 6.9 8.5*  HGB 8.2* 8.8* 7.7* 8.4* 8.6*  HCT 26.3* 28.8* 26.1* 26.9* 28.9*  MCV 97.0 96.3 98.9 97.5 98.0  PLT 381 350 330 424* 385    Basic Metabolic Panel: Recent Labs  Lab 04/16/20 0140 04/18/20 0150 04/19/20 0843 04/20/20 1006 04/21/20 1113  NA 145 145 145 150* 150*  K 4.4 4.5 4.5 4.4 4.2  CL 109 109 108 112* 113*  CO2 26 27 25 28 26   GLUCOSE 156* 191* 223* 261* 232*  BUN 37* 40* 40* 43* 50*  CREATININE 0.77 0.72 0.68 0.67 0.80  CALCIUM 9.1 9.0 9.3 9.4 9.1    GFR: Estimated Creatinine Clearance: 35.4 mL/min (by C-G formula based on SCr of 0.8 mg/dL).  Liver Function Tests: No results for input(s): AST, ALT, ALKPHOS, BILITOT, PROT, ALBUMIN in the last 168 hours.  CBG: Recent Labs  Lab  04/20/20 2126 04/21/20 0009 04/21/20 0306 04/21/20 0736 04/21/20 1129  GLUCAP 176* 208* 179* 206* 211*     No results found for this or any previous visit (from the past 240 hour(s)).       Radiology Studies: No results found.      Scheduled Meds: . amLODipine  10 mg Per Tube Daily  . chlorhexidine  15 mL Mouth Rinse BID  . Chlorhexidine Gluconate Cloth  6 each Topical Daily  . clopidogrel  75 mg Per Tube Daily  . feeding supplement (GLUCERNA 1.5 CAL)  960 mL Per Tube Q24H  . feeding supplement (PROSource TF)  45 mL Per Tube Daily  . free water  200 mL Per Tube Q4H  . heparin injection (subcutaneous)  5,000 Units Subcutaneous Q8H  . insulin aspart  0-9 Units Subcutaneous Q4H  . insulin detemir  10 Units Subcutaneous BID  . mouth rinse  15 mL Mouth Rinse BID  . metoprolol tartrate  25 mg Per Tube BID  . pantoprazole sodium  40 mg Per Tube Daily  . rosuvastatin  5 mg Per Tube Daily   Continuous Infusions: . dextrose 50 mL/hr at 04/21/20 1253     LOS: 44 days        04/23/20, MD [facility]   To contact the attending provider between 7A-7P or the covering provider during after hours 7P-7A, please log into the web site [email or web address] and access using universal [place] password for that web site. If you do not have the password, please call the hospital operator.  04/21/2020, 2:49 PM

## 2020-04-21 NOTE — Progress Notes (Signed)
Nutrition Follow-up  DOCUMENTATION CODES:   Not applicable  INTERVENTION:   ContinueTFvia PEG: -Glucerna 1.5cal@40ml /hr (957ml) -35ml Prosource TF daily -265ml free water Q8H (or per MD)  Tube feeding will provide 1480kcals,90grams protein,1362ml free water   NUTRITION DIAGNOSIS:   Inadequate oral intake related to lethargy/confusion as evidenced by other (comment) (per RN report).  Ongoing  GOAL:   Patient will meet greater than or equal to 90% of their needs  Met with TF  MONITOR:   PO intake, Diet advancement, TF tolerance, Labs, Weight trends  REASON FOR ASSESSMENT:   Consult Enteral/tube feeding initiation and management  ASSESSMENT:   Pt with a history of stroke in June, HTN, MI and DM presented 7/12 with N/V and AMS. Pt admitted w/ uremia and possible infection. In hospital, R sided weakness not improving, MRI showed new acute infarcts.   7/15 NGT placed (gastric), TF initiated 7/16 NGT advanced to LOT 7/17 diet advanced to full liquids, TF reduced by MD 7/23 NGT replaced with Cortrak (gastric) 7/26 pt made NPO by SLP due to dysphagia 8/4 s/p PEG  8/8 TFs held due to pt vomiting; abd xray revealed possible fecal impaction; CXR showed possible aspiration PNA 8/9 pt had BM; TF resumed 8/12 CT revealed dense bilateral multifocal aspiration pneumonia 8/14 TF rate reduced by MD to 78ml/hr   Discussed pt with RN.   After meetings with PMT, pt remains full code. Plan is for pt to d/c to SNF with Palliative follow-up.  Current TF: Glucerna 1.5 cal @ 25ml/hr, 1ml Prosource TF daily  Labs: Na 150 (H), CBGs 208-179-206 Medications: Novolog, Levemir, Protonix    Diet Order:   Diet Order            Diet NPO time specified  Diet effective midnight                 EDUCATION NEEDS:   No education needs have been identified at this time  Skin:  Skin Assessment: Skin Integrity Issues: Skin Integrity Issues:: Other (Comment) Other:  MASD perineum  Last BM:  8/24 type 6  Height:   Ht Readings from Last 1 Encounters:  03/08/20 5\' 4"  (1.626 m)    Weight:   Wt Readings from Last 1 Encounters:  04/21/20 35.8 kg    BMI:  Body mass index is 13.56 kg/m.  Estimated Nutritional Needs:   Kcal:  1500-1700 kcal  Protein:  75-85 grams  Fluid:  >/= 1.8 L/day    Larkin Ina, MS, RD, LDN RD pager number and weekend/on-call pager number located in [place].

## 2020-04-21 NOTE — Plan of Care (Signed)

## 2020-04-21 NOTE — Progress Notes (Signed)
Occupational Therapy Treatment Patient Details Name: Tasha [name] MRN: [id number] DOB: [birth year] Today's Date: 04/21/2020    History of present illness Tasha [name] is a 74 y.o. 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 In hospital, R sided weakness not improving, MRI showed new acute infarcts of L pontine and post body R lateral ventricle. Pt had Gtube placed on 04/01/2020   OT comments  Patient supine in bed with eyes closed upon entry.  Once assisted with cleaning eyes/face (lashes stuck together), patient able to open eyes (R>L).  Assisted with oral care, hand over hand, with patient unable to follow oral commands today.  Able to squeeze minimally with L thumb.  Tolerated PROM of BUEs.  Total assist +2 for hygiene due to bowel incontinence. Decreased frequency to 1x per week.  Will re-assess goals next week.   Follow Up Recommendations  SNF;Supervision/Assistance - 24 hour (maybe more appropriate for long term care )    Equipment Recommendations  None recommended by OT    Recommendations for Other Services      Precautions / Restrictions Precautions Precautions: Fall Precaution Comments: right hemiplegia, PEG Required Braces or Orthoses: Splint/Cast Splint/Cast: bil resting hand splints- night wear only       Mobility Bed Mobility Overal bed mobility: Needs Assistance Bed Mobility: Rolling Rolling: Total assist;+2 for physical assistance;+2 for safety/equipment         General bed mobility comments: Pt with incontinence of bowels x 2 during session; unable to attempt EOB (and pt more lethargic--even after rolling and PROM)  Transfers                      Balance                                           ADL either performed or assessed with clinical judgement   ADL Overall ADL's : Needs  assistance/impaired Eating/Feeding: Total assistance Eating/Feeding Details (indicate cue type and reason): NPO, peg tube feeds Grooming: Total assistance;Bed level;Wash/dry face;Oral care                       Toileting- Clothing Manipulation and Hygiene: Total assistance;+2 for physical assistance;+2 for safety/equipment Toileting - Clothing Manipulation Details (indicate cue type and reason): bed level hygiene, bowel incontience     Functional mobility during ADLs: Total assistance;+2 for physical assistance;+2 for safety/equipment General ADL Comments: pt not following commands today     Vision   Additional Comments: able to open eyes after stimulation (washing face) but overall not scanning or following commands; focused gaze only   Perception     Praxis      Cognition Arousal/Alertness: Lethargic Behavior During Therapy: Flat affect Overall Cognitive Status: Difficult to assess Area of Impairment: Following commands                       Following Commands:  (not following commands; more lethargic)       General Comments: sleeping on arrival; required incr stimulation to arouse (washing face/PROM); did not follow any oral motor commands today; 1x squeeze with left hand/thumb         Exercises Exercises: Other exercises General Exercises - Lower Extremity Ankle Circles/Pumps: Both;Supine;PROM;5  reps (followed by 30 sec heel cord stretch; PROM WFL) Hip ABduction/ADduction: PROM;Supine Other Exercises Other Exercises: cervical rotation to left; positioning with pillow+towel to neutral position   Shoulder Instructions       General Comments gentle PROM of UEs and elevated on pillows to support edema    Pertinent Vitals/ Pain       Pain Assessment: Faces Faces Pain Scale: Hurts even more Pain Location: with cervical rotation to left; without ROM no signs pain Pain Descriptors / Indicators: Grimacing Pain Intervention(s): Limited activity  within patient's tolerance;Monitored during session;Patient requesting pain meds-RN notified  Home Living                                          Prior Functioning/Environment              Frequency  Min 2X/week        Progress Toward Goals  OT Goals(current goals can now be found in the care plan section)  Progress towards OT goals: Not progressing toward goals - comment (pt not following commands, lethargic)  Acute Rehab OT Goals Patient Stated Goal: Unable to state  Plan Frequency needs to be updated    Co-evaluation    PT/OT/SLP Co-Evaluation/Treatment: Yes Reason for Co-Treatment: Complexity of the patient's impairments (multi-system involvement) PT goals addressed during session: Mobility/safety with mobility;Other (comment) (following commands) OT goals addressed during session: ADL's and self-care      AM-PAC OT "6 Clicks" Daily Activity     Outcome Measure   Help from another person eating meals?: Total Help from another person taking care of personal grooming?: Total Help from another person toileting, which includes using toliet, bedpan, or urinal?: Total Help from another person bathing (including washing, rinsing, drying)?: Total Help from another person to put on and taking off regular upper body clothing?: Total Help from another person to put on and taking off regular lower body clothing?: Total 6 Click Score: 6    End of Session    OT Visit Diagnosis: Unsteadiness on feet (R26.81);History of falling (Z91.81);Muscle weakness (generalized) (M62.81);Other symptoms and signs involving the nervous system (R29.898);Other symptoms and signs involving cognitive function;Cognitive communication deficit (R41.841);Other abnormalities of gait and mobility (R26.89)   Activity Tolerance Patient limited by lethargy   Patient Left in bed;with bed alarm set;with call bell/phone within reach   Nurse Communication Mobility status;Other  (comment) (status)        Time: 0626-9485 OT Time Calculation (min): 35 min  Charges: OT General Charges $OT Visit: 1 Visit OT Treatments $Self Care/Home Management : 8-22 mins  Barry Brunner, OT Acute Rehabilitation Services Pager [phone] Office [phone]    Chancy Milroy 04/21/2020, 1:24 PM

## 2020-04-21 NOTE — Progress Notes (Signed)
Physical Therapy Treatment Patient Details Name: Tasha [name] MRN: [id number] DOB: 28-Jul-1946 Today's Date: 04/21/2020    History of Present Illness Tasha [name] is a 74 y.o. 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 In hospital, R sided weakness not improving, MRI showed new acute infarcts of L pontine and post body R lateral ventricle. Pt had Gtube placed on 04/01/2020    PT Comments    Patient sleeping on arrival. Initially unable to open her eyes due to eyelashes stuck together. Once eyelashes cleaned off, she could open rt eye more than left eye. OT performed hand over hand oral care to moisten her mouth. She did not follow commands for stick out tongue or close mouth, even when given multi-modal cues. Initiated PROM and noted pt with incontinence of bowels and incorporated rolling, PROM, and attempts to follow simple commands as pt positioned for cleaning/changing linens. Patient unable to repeat her performance on 8/23. ?related to recent anesthesia or further decline. Will decr frequency to 1x/week and reassess next week.     Follow Up Recommendations  SNF (if no progress towards goals, will need long-term care)     Equipment Recommendations  None recommended by PT    Recommendations for Other Services       Precautions / Restrictions Precautions Precautions: Fall Precaution Comments: right hemiplegia, PEG Required Braces or Orthoses: Splint/Cast Splint/Cast: bil resting hand splints- night wear only    Mobility  Bed Mobility Overal bed mobility: Needs Assistance Bed Mobility: Rolling Rolling: Total assist;+2 for physical assistance;+2 for safety/equipment         General bed mobility comments: Pt with incontinence of bowels x 2 during session; unable to attempt EOB (and pt more lethargic--even after rolling and PROM)  Transfers                     Ambulation/Gait                 Stairs             Wheelchair Mobility    Modified Rankin (Stroke Patients Only) Modified Rankin (Stroke Patients Only) Pre-Morbid Rankin Score: Severe disability Modified Rankin: Severe disability     Balance                                            Cognition Arousal/Alertness: Lethargic Behavior During Therapy: Flat affect Overall Cognitive Status: Difficult to assess Area of Impairment: Following commands                       Following Commands:  (not following commands; more lethargic)       General Comments: sleeping on arrival; required incr stimulation to arouse (washing face/PROM); did not follow any oral motor commands today; 1x squeeze with left hand/thumb       Exercises General Exercises - Lower Extremity Ankle Circles/Pumps: Both;Supine;PROM;5 reps (followed by 30 sec heel cord stretch; PROM WFL) Hip ABduction/ADduction: PROM;Supine Other Exercises Other Exercises: cervical rotation to left; positioning with pillow+towel to neutral position    General Comments        Pertinent Vitals/Pain Pain Assessment: Faces Faces Pain Scale: Hurts even more Pain Location: with cervical rotation to left; without ROM no signs pain Pain  Descriptors / Indicators: Grimacing Pain Intervention(s): Limited activity within patient's tolerance;Monitored during session;Repositioned    Home Living                      Prior Function            PT Goals (current goals can now be found in the care plan section) Acute Rehab PT Goals Patient Stated Goal: Unable to state Time For Goal Achievement: 04/27/20 Potential to Achieve Goals: Poor Progress towards PT goals: Not progressing toward goals - comment (for cervical/trunk ROM (more stiff this date))    Frequency    Min 1X/week      PT Plan Current plan remains appropriate;Frequency needs to be updated     Co-evaluation PT/OT/SLP Co-Evaluation/Treatment: Yes Reason for Co-Treatment: Complexity of the patient's impairments (multi-system involvement) PT goals addressed during session: Mobility/safety with mobility;Other (comment) (following commands)        AM-PAC PT "6 Clicks" Mobility   Outcome Measure  Help needed turning from your back to your side while in a flat bed without using bedrails?: Total Help needed moving from lying on your back to sitting on the side of a flat bed without using bedrails?: Total Help needed moving to and from a bed to a chair (including a wheelchair)?: Total Help needed standing up from a chair using your arms (e.g., wheelchair or bedside chair)?: Total Help needed to walk in hospital room?: Total Help needed climbing 3-5 steps with a railing? : Total 6 Click Score: 6    End of Session   Activity Tolerance: Patient limited by lethargy Patient left: in bed;with bed alarm set   PT Visit Diagnosis: Other abnormalities of gait and mobility (R26.89);Muscle weakness (generalized) (M62.81);Hemiplegia and hemiparesis Hemiplegia - Right/Left: Right Hemiplegia - dominant/non-dominant: Dominant Hemiplegia - caused by: Cerebral infarction     Time: 1021-1055 PT Time Calculation (min) (ACUTE ONLY): 34 min  Charges:  $Neuromuscular Re-education: 8-22 mins                      Jerolyn Center, PT Pager [phone]    Zena Amos 04/21/2020, 12:28 PM

## 2020-04-22 LAB — BASIC METABOLIC PANEL
Anion gap: 8 (ref 5–15)
BUN: 44 mg/dL — ABNORMAL HIGH (ref 8–23)
CO2: 27 mmol/L (ref 22–32)
Calcium: 8.9 mg/dL (ref 8.9–10.3)
Chloride: 107 mmol/L (ref 98–111)
Creatinine, Ser: 0.64 mg/dL (ref 0.44–1.00)
GFR calc Af Amer: >60 mL/min (ref >60)
GFR calc non Af Amer: >60 mL/min (ref >60)
Glucose, Bld: 176 mg/dL — ABNORMAL HIGH (ref 70–99)
Potassium: 4.3 mmol/L (ref 3.5–5.1)
Sodium: 142 mmol/L (ref 135–145)

## 2020-04-22 LAB — GLUCOSE, CAPILLARY
Glucose-Capillary: 161 mg/dL — ABNORMAL HIGH (ref 70–99)
Glucose-Capillary: 135 mg/dL — ABNORMAL HIGH (ref 70–99)
Glucose-Capillary: 148 mg/dL — ABNORMAL HIGH (ref 70–99)
Glucose-Capillary: 179 mg/dL — ABNORMAL HIGH (ref 70–99)
Glucose-Capillary: 162 mg/dL — ABNORMAL HIGH (ref 70–99)
Glucose-Capillary: 158 mg/dL — ABNORMAL HIGH (ref 70–99)

## 2020-04-22 MED ORDER — SODIUM CHLORIDE 0.9 % IV SOLN
INTRAVENOUS | Status: DC | PRN
  Administered 2020-04-22: 250 mL via INTRAVENOUS

## 2020-04-22 NOTE — Progress Notes (Signed)
  Speech Language Pathology Treatment:    Patient Details Name: Tasha [name] MRN: [id number] DOB: 06-Mar-1946 Today's Date: 04/22/2020 Time: 5449-2010 SLP Time Calculation (min) (ACUTE ONLY): 13 min  Assessment / Plan / Recommendation Clinical Impression  Pt was seen for dysphagia treatment. Pt's level of alertness improved during this session and she verbalized "yeah" in response to one of the SLP's questions. Pt's nurse tech indicated that the pt stated, "Your hands are very cold" earlier today.  She exhibited lingual movement with verbal and tactile cues and applied resistance to a tongue depressor. With cueing, she exhibited some bolus manipulation and lingual moevement with puree; however, she was unable to demonstrate A-P transport and the bolus was ultimately removed. No swallows were palpated despite cueing. SLP will conitnue to follow pt.    HPI HPI: Tasha [name] is a 74 y.o. female with history of stroke in June, HTN, MI and DM presenting 7/12 with nausea and vomiting, altered mental status, admitted w/ uremia and possible infection. In hospital, R sided weakness not improving, MRI showed acute left pontine infarction. Acute small infarct along the posterior body of the right lateral ventricle. Seen for swallowing during admission 02/17/20 >nectar thick then upgraded to thin/full liquids. Currently s/p PEG tube for nutrition, palliative following      SLP Plan  Continue with current plan of care       Recommendations  Diet recommendations: NPO Medication Administration: Via alternative means                Oral Care Recommendations: Oral care QID Follow up Recommendations: Skilled Nursing facility;24 hour supervision/assistance SLP Visit Diagnosis: Dysphagia, oropharyngeal phase (R13.12) Plan: Continue with current plan of care       Marcelles Clinard I. Vear Clock, MS, CCC-SLP Acute Rehabilitation Services Office number [phone] Pager [phone]                Scheryl Marten 04/22/2020, 5:33 PM

## 2020-04-22 NOTE — Plan of Care (Signed)

## 2020-04-22 NOTE — Progress Notes (Signed)
Pt noted to have D5 infusing while TF infusing. Dr. Antionette Char informed d/t this care nurse concern for erroneous high CBGs d/t the combination of infusions.

## 2020-04-22 NOTE — Progress Notes (Signed)
PROGRESS NOTE    Tasha [name]  EYC:[id number] DOB: 1946/02/28 DOA: 03/08/2020 PCP: Patient, No Pcp Per    Chief Complaint  Patient presents with  . Emesis    Brief Narrative:   74 year old lady with prior history of type 2 diabetes, hypertension, MI, recent CVA in June 2021 presents in July fall 2021 with acute encephalopathy, intractable nausea and vomiting and was found to have AKI with lactic acidosis that improved with supportive care.  MRI of the brain subsequently on 03/14/2020 showed acute left pontine infarct and acute small infarct in the posterior right lateral ventricle.  Hospital course was complicated by severe dysphagia requiring initiation of tube feeds followed by PEG placement on 03/31/2020 as well as Zosyn for aspiration pneumonia.  Palliative care was consulted to assist in goals of care discussion due to poor patient's poor prognosis and meaningful recovery. Pt was febrile the night of 8/24, and CXR shows pneumonia. She was started on IV Unasyn for aspiration pneumonia. Blood cultures were done, negative so far.  Patient seen and examined this morning continues to be unresponsive at this time.   Assessment & Plan:   Active Problems:   CVA (cerebral vascular accident) (HCC)   DM (diabetes mellitus), type 2 with peripheral vascular complications (HCC)   Essential (primary) hypertension   Lactic acidosis   Nausea and vomiting   AKI (acute kidney injury) (HCC)   Hyperkalemia   Sepsis (HCC)   Coffee ground emesis   Palliative care encounter   Pneumonia of both lungs due to infectious organism   Advanced care planning/counseling discussion   Goals of care, counseling/discussion   Palliative care by specialist   Dysphagia    Acute combined toxic and metabolic encephalopathy Multifactorial probably combination of acute CVA, hyponatremia, hospital-acquired delirium. EEG is unrevealing.  Patient continues to be unresponsive and as per the nurse and [facility] team she has been  like this for a few days now. Vitamin B12 and TSH within normal limits.    Hypernatremia Gentle hydration with dextrose infusion at 50 mL/h and free water boluses via PEG tube. Repeat BMP shows normal sodium of 142.    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 Patient currently unresponsive. Patient currently on Plavix and Crestor as recommended by neurology for prophylaxis.    Severe bilateral aspiration pneumonia in the setting of severe dysphagia Patient completed 9-day course of IV Zosyn.  Palliative care consulted for goals of care discussion.  Patient currently has PEG tube and has tube feeds running. Recurrent aspiration pneumonia on chest x-ray 04/21/2020 she was empirically started on IV Unasyn for aspiration pneumonia.  Blood cultures were done and negative so far   Essential hypertension Blood pressure parameters are optimal Continue with amlodipine and metoprolol.   AKI Resolved with IV fluids.    Type 2 diabetes mellitus Hemoglobin A1c is 6.9 CBG (last 3)  Recent Labs    04/22/20 0313 04/22/20 0734 04/22/20 1125  GLUCAP 161* 135* 148*  Continue with sliding scale insulin.  No changes in medications.    Depression Continue with low-dose Remeron.  Left breast mass seen on CT of the chest on 04/08/2020. Pending goals of care discussion.  Patient will need diagnostic mammogram as an outpatient.    Acute urinary retention Foley catheter placed on 04/05/2020.          Poor progression, multiple comorbidities, clinical deterioration, recurrent aspiration pneumonias, dysphagia, palliative care consulted for goals  of care discussion.   DVT prophylaxis: (Heparin/) Code Status: full code.  Family Communication: none at bedside.  Called her son over the phone and left a message. Disposition:   Status is: Inpatient  Remains inpatient appropriate because:IV treatments  appropriate due to intensity of illness or inability to take PO   Dispo:  Patient From:  home.   Planned Disposition:  SNF  Expected discharge date:   > 3 DAYS.   Medically stable for discharge:  NO         Consultants:   Palliative care.   Procedures: none.   Antimicrobials: Anti-infectives (From admission, onward)   Start     Dose/Rate Route Frequency Ordered Stop   04/21/20 1600  ampicillin-sulbactam (UNASYN) 1.5 g in sodium chloride 0.9 % 100 mL IVPB        1.5 g 200 mL/hr over 30 Minutes Intravenous Every 8 hours 04/21/20 1457     04/05/20 1800  vancomycin (VANCOREADY) IVPB 750 mg/150 mL  Status:  Discontinued        750 mg 150 mL/hr over 60 Minutes Intravenous Every 24 hours 04/04/20 1727 04/07/20 0805   04/04/20 1730  vancomycin (VANCOREADY) IVPB 1250 mg/250 mL        1,250 mg 166.7 mL/hr over 90 Minutes Intravenous  Once 04/04/20 1727 04/04/20 1948   04/04/20 1730  piperacillin-tazobactam (ZOSYN) IVPB 3.375 g  Status:  Discontinued        3.375 g 12.5 mL/hr over 240 Minutes Intravenous Every 8 hours 04/04/20 1727 04/12/20 1003   04/04/20 1715  piperacillin-tazobactam (ZOSYN) IVPB 3.375 g  Status:  Discontinued       Note to Pharmacy: Adjust as per renal function   3.375 g 100 mL/hr over 30 Minutes Intravenous Every 8 hours 04/04/20 1706 04/04/20 1726   03/31/20 1207  vancomycin (VANCOCIN) 1-5 GM/200ML-% IVPB       Note to Pharmacy: [name], [name]   : cabinet override      03/31/20 1207 03/31/20 1232   03/31/20 1200  vancomycin (VANCOCIN) IVPB 1000 mg/200 mL premix        1,000 mg 200 mL/hr over 60 Minutes Intravenous  Once 03/31/20 1155 03/31/20 1509   03/10/20 1200  cefTRIAXone (ROCEPHIN) 2 g in sodium chloride 0.9 % 100 mL IVPB        2 g 200 mL/hr over 30 Minutes Intravenous Every 24 hours 03/10/20 0924 03/14/20 1333   03/09/20 1000  ceFEPIme (MAXIPIME) 1 g in sodium chloride 0.9 % 100 mL IVPB  Status:  Discontinued        1 g 200 mL/hr over 30 Minutes  Intravenous Every 24 hours 03/08/20 1219 03/10/20 0924   03/08/20 1219  vancomycin variable dose per unstable renal function (pharmacist dosing)  Status:  Discontinued         Does not apply See admin instructions 03/08/20 1219 03/10/20 0906   03/08/20 1100  vancomycin (VANCOCIN) IVPB 1000 mg/200 mL premix        1,000 mg 200 mL/hr over 60 Minutes Intravenous  Once 03/08/20 1046 03/08/20 1406   03/08/20 1100  ceFEPIme (MAXIPIME) 2 g in sodium chloride 0.9 % 100 mL IVPB        2 g 200 mL/hr over 30 Minutes Intravenous  Once 03/08/20 1046 03/08/20 1211         Subjective: Patient not responding to verbal cues, sternal rub.  Objective: Vitals:   04/21/20 2300 04/22/20 0316 04/22/20 0737 04/22/20 1130  BP: 129/63  133/69 (!) 139/53 (!) 125/51  Pulse: 81 88 78 84  Resp: 16 16 18 18   Temp: 98.8 F (37.1 C) 98.5 F (36.9 C) 97.9 F (36.6 C) 98.1 F (36.7 C)  TempSrc: Axillary Axillary Axillary Axillary  SpO2: 99% 99% 100% 94%  Weight:  57.6 kg    Height:        Intake/Output Summary (Last 24 hours) at 04/22/2020 1444 Last data filed at 04/22/2020 0330 Gross per 24 hour  Intake 2852.22 ml  Output 1200 ml  Net 1652.22 ml   Filed Weights   04/20/20 0311 04/21/20 0316 04/22/20 0316  Weight: 56.7 kg 35.8 kg 57.6 kg    Examination:  General exam: Cachectic looking lady not responding to verbal cues for sternal rub. Respiratory system: Diminished air entry at bases, no wheezing, some tachypnea Cardiovascular system: S1-S2 heard, regular rate rhythm, no JVD, no pedal edema. Gastrointestinal system: A abdomen is soft, nontender, nondistended bowel sounds normal, G-tube in place Central nervous system: Patient is currently unresponsive to verbal cues and sternal rub Extremities: No cyanosis or clubbing. Skin: No rashes Psychiatry: Cannot be assessed    Data Reviewed: I have personally reviewed following labs and imaging studies  CBC: Recent Labs  Lab 04/16/20 0140  04/18/20 0150 04/19/20 0155 04/20/20 0256  WBC 13.5* 11.6* 10.4 12.1*  NEUTROABS 10.0* 8.2* 6.9 8.5*  HGB 8.8* 7.7* 8.4* 8.6*  HCT 28.8* 26.1* 26.9* 28.9*  MCV 96.3 98.9 97.5 98.0  PLT 350 330 424* 385    Basic Metabolic Panel: Recent Labs  Lab 04/18/20 0150 04/19/20 0843 04/20/20 1006 04/21/20 1113 04/22/20 0907  NA 145 145 150* 150* 142  K 4.5 4.5 4.4 4.2 4.3  CL 109 108 112* 113* 107  CO2 27 25 28 26 27   GLUCOSE 191* 223* 261* 232* 176*  BUN 40* 40* 43* 50* 44*  CREATININE 0.72 0.68 0.67 0.80 0.64  CALCIUM 9.0 9.3 9.4 9.1 8.9    GFR: Estimated Creatinine Clearance: 54.1 mL/min (by C-G formula based on SCr of 0.64 mg/dL).  Liver Function Tests: No results for input(s): AST, ALT, ALKPHOS, BILITOT, PROT, ALBUMIN in the last 168 hours.  CBG: Recent Labs  Lab 04/21/20 2126 04/21/20 2326 04/22/20 0313 04/22/20 0734 04/22/20 1125  GLUCAP 180* 184* 161* 135* 148*     Recent Results (from the past 240 hour(s))  Culture, blood (Routine X 2) w Reflex to ID Panel     Status: None (Preliminary result)   Collection Time: 04/21/20  4:09 PM   Specimen: BLOOD  Result Value Ref Range Status   Specimen Description BLOOD LEFT ANTECUBITAL  Final   Special Requests   Final    BOTTLES DRAWN AEROBIC AND ANAEROBIC Blood Culture adequate volume   Culture   Final    NO GROWTH < 24 HOURS Performed at [facility], [address] [place], [address] [place]    Report Status PENDING  Incomplete  Culture, blood (Routine X 2) w Reflex to ID Panel     Status: None (Preliminary result)   Collection Time: 04/21/20  4:12 PM   Specimen: BLOOD RIGHT ARM  Result Value Ref Range Status   Specimen Description BLOOD RIGHT ARM  Final   Special Requests   Final    BOTTLES DRAWN AEROBIC AND ANAEROBIC Blood Culture adequate volume   Culture   Final    NO GROWTH < 24 HOURS Performed at [facility], [address] [name], [address] [place]  Report Status PENDING   Incomplete         Radiology Studies: DG CHEST PORT 1 VIEW  Result Date: 04/21/2020 CLINICAL DATA:  Fever and hypertension EXAM: PORTABLE CHEST 1 VIEW COMPARISON:  April 04, 2020 FINDINGS: There is ill-defined airspace opacity in the right perihilar region. There is a small granuloma in the right apex. Lungs elsewhere clear. Heart size and pulmonary vascularity are normal. No adenopathy. No bone lesions. IMPRESSION: Ill-defined opacity concerning for pneumonia in the right perihilar region. Small granuloma right apex. Lungs elsewhere clear. Cardiac silhouette within normal limits. No adenopathy. Electronically Signed   By: Bretta Bang III M.D.   On: 04/21/2020 15:36        Scheduled Meds: . amLODipine  10 mg Per Tube Daily  . chlorhexidine  15 mL Mouth Rinse BID  . Chlorhexidine Gluconate Cloth  6 each Topical Daily  . clopidogrel  75 mg Per Tube Daily  . feeding supplement (GLUCERNA 1.5 CAL)  960 mL Per Tube Q24H  . feeding supplement (PROSource TF)  45 mL Per Tube Daily  . free water  200 mL Per Tube Q4H  . heparin injection (subcutaneous)  5,000 Units Subcutaneous Q8H  . insulin aspart  0-9 Units Subcutaneous Q4H  . insulin detemir  10 Units Subcutaneous BID  . mouth rinse  15 mL Mouth Rinse BID  . metoprolol tartrate  25 mg Per Tube BID  . pantoprazole sodium  40 mg Per Tube Daily  . rosuvastatin  5 mg Per Tube Daily   Continuous Infusions: . ampicillin-sulbactam (UNASYN) IV 1.5 g (04/22/20 1115)     LOS: 45 days        Kathlen Mody, MD [facility]   To contact the attending provider between 7A-7P or the covering provider during after hours 7P-7A, please log into the web site [email or web address] and access using universal [place] password for that web site. If you do not have the password, please call the hospital operator.  04/22/2020, 2:44 PM

## 2020-04-23 LAB — CBC WITH DIFFERENTIAL/PLATELET
Abs Immature Granulocytes: 0.16 10*3/uL — ABNORMAL HIGH (ref 0.00–0.07)
Basophils Absolute: 0.1 10*3/uL (ref 0.0–0.1)
Basophils Relative: 1 %
Eosinophils Absolute: 0.4 10*3/uL (ref 0.0–0.5)
Eosinophils Relative: 4 %
HCT: 24.7 % — ABNORMAL LOW (ref 36.0–46.0)
Hemoglobin: 7.4 g/dL — ABNORMAL LOW (ref 12.0–15.0)
Immature Granulocytes: 2 %
Lymphocytes Relative: 31 %
Lymphs Abs: 2.8 10*3/uL (ref 0.7–4.0)
MCH: 29.5 pg (ref 26.0–34.0)
MCHC: 30 g/dL (ref 30.0–36.0)
MCV: 98.4 fL (ref 80.0–100.0)
Monocytes Absolute: 0.7 10*3/uL (ref 0.1–1.0)
Monocytes Relative: 8 %
Neutro Abs: 4.9 10*3/uL (ref 1.7–7.7)
Neutrophils Relative %: 54 %
Platelets: 312 10*3/uL (ref 150–400)
RBC: 2.51 MIL/uL — ABNORMAL LOW (ref 3.87–5.11)
RDW: 18.2 % — ABNORMAL HIGH (ref 11.5–15.5)
WBC: 9.1 10*3/uL (ref 4.0–10.5)
nRBC: 0 % (ref 0.0–0.2)

## 2020-04-23 LAB — GLUCOSE, CAPILLARY
Glucose-Capillary: 109 mg/dL — ABNORMAL HIGH (ref 70–99)
Glucose-Capillary: 127 mg/dL — ABNORMAL HIGH (ref 70–99)
Glucose-Capillary: 143 mg/dL — ABNORMAL HIGH (ref 70–99)
Glucose-Capillary: 145 mg/dL — ABNORMAL HIGH (ref 70–99)
Glucose-Capillary: 152 mg/dL — ABNORMAL HIGH (ref 70–99)
Glucose-Capillary: 160 mg/dL — ABNORMAL HIGH (ref 70–99)
Glucose-Capillary: 181 mg/dL — ABNORMAL HIGH (ref 70–99)

## 2020-04-23 LAB — BASIC METABOLIC PANEL
Anion gap: 9 (ref 5–15)
BUN: 36 mg/dL — ABNORMAL HIGH (ref 8–23)
CO2: 25 mmol/L (ref 22–32)
Calcium: 8.8 mg/dL — ABNORMAL LOW (ref 8.9–10.3)
Chloride: 107 mmol/L (ref 98–111)
Creatinine, Ser: 0.59 mg/dL (ref 0.44–1.00)
GFR calc Af Amer: >60 mL/min (ref >60)
GFR calc non Af Amer: >60 mL/min (ref >60)
Glucose, Bld: 134 mg/dL — ABNORMAL HIGH (ref 70–99)
Potassium: 3.9 mmol/L (ref 3.5–5.1)
Sodium: 141 mmol/L (ref 135–145)

## 2020-04-23 MED ORDER — SODIUM CHLORIDE 0.9 % IV SOLN: INTRAVENOUS | Status: DC

## 2020-04-23 NOTE — Progress Notes (Signed)
PROGRESS NOTE    Tasha OctaveBetsy Willden  GMW:[id number] DOB: 10/31/1945 DOA: 03/08/2020 PCP: Patient, No Pcp Per    Chief Complaint  Patient presents with  . Emesis    Brief Narrative:   74 year old lady with prior history of type 2 diabetes, hypertension, MI, recent CVA in June 2021 presents in July fall 2021 with acute encephalopathy, intractable nausea and vomiting and was found to have AKI with lactic acidosis that improved with supportive care.  MRI of the brain subsequently on 03/14/2020 showed acute left pontine infarct and acute small infarct in the posterior right lateral ventricle.  Hospital course was complicated by severe dysphagia requiring initiation of tube feeds followed by PEG placement on 03/31/2020 as well as Zosyn for aspiration pneumonia.  Palliative care was consulted to assist in goals of care discussion due to poor patient's poor prognosis and meaningful recovery. Pt was febrile the night of 8/24, and CXR shows pneumonia. She was started on IV Unasyn for aspiration pneumonia. Blood cultures were done, negative so far.  Patient seen and examined at bedside today she is opening her eyes to verbal cues and when asked if she is feeling okay she said yes.  As per RN patient's PEG tube is clogged and IR consulted was able to flush the meds that were clogging the PEG tube and tube feeds to be resumed.  Assessment & Plan:   Active Problems:   CVA (cerebral vascular accident) (HCC)   DM (diabetes mellitus), type 2 with peripheral vascular complications (HCC)   Essential (primary) hypertension   Lactic acidosis   Nausea and vomiting   AKI (acute kidney injury) (HCC)   Hyperkalemia   Sepsis (HCC)   Coffee ground emesis   Palliative care encounter   Pneumonia of both lungs due to infectious organism   Advanced care planning/counseling discussion   Goals of care, counseling/discussion   Palliative care by specialist   Dysphagia    Acute combined toxic and metabolic  encephalopathy Multifactorial probably combination of acute CVA, hyponatremia, hospital-acquired delirium. EEG is unrevealing.  Patient open eyes to verbal cues and said okay to questions.  She appears to have improved when compared to the last 2 days. Vitamin B12 and TSH within normal limits.    Hypernatremia Gentle hydration with dextrose infusion at 50 mL/h and free water boluses via PEG tube. Repeat BMP shows normal sodium of 142. Stop the dextrose infusion and continue with free water flushes via the PEG.    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 Patient currently on Plavix and Crestor as recommended by neurology for prophylaxis.    Severe bilateral aspiration pneumonia in the setting of severe dysphagia Patient completed 9-day course of IV Zosyn.  Palliative care consulted for goals of care discussion.  Patient currently has PEG tube and has tube feeds running. Recurrent aspiration pneumonia on chest x-ray 04/21/2020 she was empirically started on IV Unasyn for aspiration pneumonia.  Blood cultures were done and negative so far  PEG tube is clogged and IR consulted was able to flush the meds that were clogging the PEG tube and tube feeds to be resumed.    Essential hypertension Blood pressure parameters are optimal Continue with amlodipine and metoprolol.   AKI Resolved with IV fluids.    Type 2 diabetes mellitus Hemoglobin A1c is 6.9 CBG (last 3)  Recent Labs    04/23/20 0856 04/23/20 1220 04/23/20 1622  GLUCAP 160* 143* 109*  Continue sliding scale insulin.    Depression Continue with low-dose Remeron.  Left breast mass seen on CT of the chest on 04/08/2020. Pending goals of care discussion.  Patient will need diagnostic mammogram as an outpatient.    Acute urinary retention Foley catheter placed on 04/05/2020.          Poor progression, multiple comorbidities,  clinical deterioration, recurrent aspiration pneumonias, dysphagia, palliative care consulted for goals of care discussion.   DVT prophylaxis: (Heparin/) Code Status: full code.  Family Communication: none at bedside.  Called her son over the phone and left a message. Disposition:   Status is: Inpatient  Remains inpatient appropriate because:IV treatments appropriate due to intensity of illness or inability to take PO   Dispo:  Patient From:  home.   Planned Disposition:  SNF  Expected discharge date:   > 3 DAYS.   Medically stable for discharge:  NO         Consultants:   Palliative care.   Procedures: none.   Antimicrobials: Anti-infectives (From admission, onward)   Start     Dose/Rate Route Frequency Ordered Stop   04/21/20 1600  ampicillin-sulbactam (UNASYN) 1.5 g in sodium chloride 0.9 % 100 mL IVPB        1.5 g 200 mL/hr over 30 Minutes Intravenous Every 8 hours 04/21/20 1457     04/05/20 1800  vancomycin (VANCOREADY) IVPB 750 mg/150 mL  Status:  Discontinued        750 mg 150 mL/hr over 60 Minutes Intravenous Every 24 hours 04/04/20 1727 04/07/20 0805   04/04/20 1730  vancomycin (VANCOREADY) IVPB 1250 mg/250 mL        1,250 mg 166.7 mL/hr over 90 Minutes Intravenous  Once 04/04/20 1727 04/04/20 1948   04/04/20 1730  piperacillin-tazobactam (ZOSYN) IVPB 3.375 g  Status:  Discontinued        3.375 g 12.5 mL/hr over 240 Minutes Intravenous Every 8 hours 04/04/20 1727 04/12/20 1003   04/04/20 1715  piperacillin-tazobactam (ZOSYN) IVPB 3.375 g  Status:  Discontinued       Note to Pharmacy: Adjust as per renal function   3.375 g 100 mL/hr over 30 Minutes Intravenous Every 8 hours 04/04/20 1706 04/04/20 1726   03/31/20 1207  vancomycin (VANCOCIN) 1-5 GM/200ML-% IVPB       Note to Pharmacy: Erie Noe   : cabinet override      03/31/20 1207 03/31/20 1232   03/31/20 1200  vancomycin (VANCOCIN) IVPB 1000 mg/200 mL premix        1,000 mg 200 mL/hr over 60  Minutes Intravenous  Once 03/31/20 1155 03/31/20 1509   03/10/20 1200  cefTRIAXone (ROCEPHIN) 2 g in sodium chloride 0.9 % 100 mL IVPB        2 g 200 mL/hr over 30 Minutes Intravenous Every 24 hours 03/10/20 0924 03/14/20 1333   03/09/20 1000  ceFEPIme (MAXIPIME) 1 g in sodium chloride 0.9 % 100 mL IVPB  Status:  Discontinued        1 g 200 mL/hr over 30 Minutes Intravenous Every 24 hours 03/08/20 1219 03/10/20 0924   03/08/20 1219  vancomycin variable dose per unstable renal function (pharmacist dosing)  Status:  Discontinued         Does not apply See admin instructions 03/08/20 1219 03/10/20 0906   03/08/20 1100  vancomycin (VANCOCIN) IVPB 1000 mg/200 mL premix        1,000 mg 200 mL/hr over 60 Minutes Intravenous  Once 03/08/20 1046  03/08/20 1406   03/08/20 1100  ceFEPIme (MAXIPIME) 2 g in sodium chloride 0.9 % 100 mL IVPB        2 g 200 mL/hr over 30 Minutes Intravenous  Once 03/08/20 1046 03/08/20 1211         Subjective: Patient is more responsive today, open eyes to verbal cues.  Objective: Vitals:   04/23/20 0327 04/23/20 0726 04/23/20 1222 04/23/20 1544  BP: (!) 144/61 134/66 (!) 141/71 (!) 140/55  Pulse: 75 78 71 70  Resp: 18 18 18 18   Temp: 98.1 F (36.7 C) 98.1 F (36.7 C) 97.8 F (36.6 C) 98.1 F (36.7 C)  TempSrc: Axillary Axillary Axillary Axillary  SpO2: 100% 100% 100% 100%  Weight: 58.5 kg     Height:        Intake/Output Summary (Last 24 hours) at 04/23/2020 1624 Last data filed at 04/23/2020 1054 Gross per 24 hour  Intake 198.75 ml  Output 2550 ml  Net -2351.25 ml   Filed Weights   04/21/20 0316 04/22/20 0316 04/23/20 0327  Weight: 35.8 kg 57.6 kg 58.5 kg    Examination:  General exam: Cachectic looking lady on 2 L of nasal cannula oxygen and appears comfortable. Respiratory system: Diminished air entry at bases, no wheezing or rhonchi no tachypnea Cardiovascular system: S1-S2 heard, regular rate rhythm, no JVD Gastrointestinal system:  Abdomen is soft, nontender, bowel sounds normal PEG in place. Central nervous system: Patient is more responsive this morning compared to yesterday was able to open her eyes with verbal cues. Extremities: Bilateral feet in boots.  Skin:  No rashes seen.  Psychiatry: Cannot be assessed    Data Reviewed: I have personally reviewed following labs and imaging studies  CBC: Recent Labs  Lab 04/18/20 0150 04/19/20 0155 04/20/20 0256 04/23/20 0314  WBC 11.6* 10.4 12.1* 9.1  NEUTROABS 8.2* 6.9 8.5* 4.9  HGB 7.7* 8.4* 8.6* 7.4*  HCT 26.1* 26.9* 28.9* 24.7*  MCV 98.9 97.5 98.0 98.4  PLT 330 424* 385 312    Basic Metabolic Panel: Recent Labs  Lab 04/19/20 0843 04/20/20 1006 04/21/20 1113 04/22/20 0907 04/23/20 0314  NA 145 150* 150* 142 141  K 4.5 4.4 4.2 4.3 3.9  CL 108 112* 113* 107 107  CO2 25 28 26 27 25   GLUCOSE 223* 261* 232* 176* 134*  BUN 40* 43* 50* 44* 36*  CREATININE 0.68 0.67 0.80 0.64 0.59  CALCIUM 9.3 9.4 9.1 8.9 8.8*    GFR: Estimated Creatinine Clearance: 54.1 mL/min (by C-G formula based on SCr of 0.59 mg/dL).  Liver Function Tests: No results for input(s): AST, ALT, ALKPHOS, BILITOT, PROT, ALBUMIN in the last 168 hours.  CBG: Recent Labs  Lab 04/23/20 0324 04/23/20 0730 04/23/20 0856 04/23/20 1220 04/23/20 1622  GLUCAP 127* 152* 160* 143* 109*     Recent Results (from the past 240 hour(s))  Culture, blood (Routine X 2) w Reflex to ID Panel     Status: None (Preliminary result)   Collection Time: 04/21/20  4:09 PM   Specimen: BLOOD  Result Value Ref Range Status   Specimen Description BLOOD LEFT ANTECUBITAL  Final   Special Requests   Final    BOTTLES DRAWN AEROBIC AND ANAEROBIC Blood Culture adequate volume   Culture   Final    NO GROWTH 2 DAYS Performed at [facility], [address] [place], [address] [place]    Report Status PENDING  Incomplete  Culture, blood (Routine X 2) w Reflex to  ID Panel     Status: None (Preliminary  result)   Collection Time: 04/21/20  4:12 PM   Specimen: BLOOD RIGHT ARM  Result Value Ref Range Status   Specimen Description BLOOD RIGHT ARM  Final   Special Requests   Final    BOTTLES DRAWN AEROBIC AND ANAEROBIC Blood Culture adequate volume   Culture   Final    NO GROWTH 2 DAYS Performed at [facility], [address], [place], [place] [address]    Report Status PENDING  Incomplete         Radiology Studies: No results found.      Scheduled Meds: . amLODipine  10 mg Per Tube Daily  . chlorhexidine  15 mL Mouth Rinse BID  . Chlorhexidine Gluconate Cloth  6 each Topical Daily  . clopidogrel  75 mg Per Tube Daily  . feeding supplement (GLUCERNA 1.5 CAL)  960 mL Per Tube Q24H  . feeding supplement (PROSource TF)  45 mL Per Tube Daily  . free water  200 mL Per Tube Q4H  . heparin injection (subcutaneous)  5,000 Units Subcutaneous Q8H  . insulin aspart  0-9 Units Subcutaneous Q4H  . insulin detemir  10 Units Subcutaneous BID  . mouth rinse  15 mL Mouth Rinse BID  . metoprolol tartrate  25 mg Per Tube BID  . pantoprazole sodium  40 mg Per Tube Daily  . rosuvastatin  5 mg Per Tube Daily   Continuous Infusions: . sodium chloride 250 mL (04/22/20 2329)  . sodium chloride 50 mL/hr at 04/23/20 1536  . ampicillin-sulbactam (UNASYN) IV 1.5 g (04/23/20 1539)     LOS: 46 days        Kathlen Mody, MD [facility]   To contact the attending provider between 7A-7P or the covering provider during after hours 7P-7A, please log into the web site [email or web address] and access using universal [place] password for that web site. If you do not have the password, please call the hospital operator.  04/23/2020, 4:24 PM

## 2020-04-23 NOTE — Progress Notes (Signed)
Called to assess clogged G-tube. Originally placed 8/4.  Tube intact, site clean. Lopez/stopcock adapter appears to have clogged with coagulated TF and/or crushed meds. Adapter disconnected and flushed clean easily with small 3 ml syringe with blue tapered tip.  G-tube itself was not clogged and flushed easily with tap water.  Lopez adapter reconnected. TF may resume.  Brayton El PA-C Interventional Radiology 04/23/2020 1:55 PM

## 2020-04-23 NOTE — Progress Notes (Signed)
RN went to assess pt feeding pump due to alarming sound stating "downstream clog". RN attempted to flush PEG tube but met resistance. RN noticed air pocket at base of tube. Tube feedings have been paused. MD Karleen Hampshire made aware.

## 2020-04-24 LAB — GLUCOSE, CAPILLARY
Glucose-Capillary: 137 mg/dL — ABNORMAL HIGH (ref 70–99)
Glucose-Capillary: 137 mg/dL — ABNORMAL HIGH (ref 70–99)
Glucose-Capillary: 163 mg/dL — ABNORMAL HIGH (ref 70–99)
Glucose-Capillary: 149 mg/dL — ABNORMAL HIGH (ref 70–99)
Glucose-Capillary: 154 mg/dL — ABNORMAL HIGH (ref 70–99)
Glucose-Capillary: 154 mg/dL — ABNORMAL HIGH (ref 70–99)

## 2020-04-24 NOTE — Progress Notes (Signed)
PROGRESS NOTE    Tasha [name]  FTD:[id number] DOB: Nov 25, 1945 DOA: 03/08/2020 PCP: Patient, No Pcp Per    Chief Complaint  Patient presents with  . Emesis    Brief Narrative:   74 year old lady with prior history of type 2 diabetes, hypertension, MI, recent CVA in June 2021 presents in July fall 2021 with acute encephalopathy, intractable nausea and vomiting and was found to have AKI with lactic acidosis that improved with supportive care.  MRI of the brain subsequently on 03/14/2020 showed acute left pontine infarct and acute small infarct in the posterior right lateral ventricle.  Hospital course was complicated by severe dysphagia requiring initiation of tube feeds followed by PEG placement on 03/31/2020 as well as Zosyn for aspiration pneumonia.  Palliative care was consulted to assist in goals of care discussion due to poor patient's poor prognosis and meaningful recovery. Pt was febrile the night of 8/24, and CXR shows pneumonia. She was started on IV Unasyn for aspiration pneumonia. Blood cultures were done, negative so far.  Patient seen and examined at bedside.  Opening eyes to verbal cues no new complaints she remains on 1 L of nasal cannula oxygen to keep sats greater than 90%.  Assessment & Plan:   Active Problems:   CVA (cerebral vascular accident) (HCC)   DM (diabetes mellitus), type 2 with peripheral vascular complications (HCC)   Essential (primary) hypertension   Lactic acidosis   Nausea and vomiting   AKI (acute kidney injury) (HCC)   Hyperkalemia   Sepsis (HCC)   Coffee ground emesis   Palliative care encounter   Pneumonia of both lungs due to infectious organism   Advanced care planning/counseling discussion   Goals of care, counseling/discussion   Palliative care by specialist   Dysphagia    Acute combined toxic and metabolic encephalopathy Multifactorial probably combination of acute CVA, hyponatremia, hospital-acquired delirium. EEG is unrevealing.  Patient  open eyes to verbal cues and said okay to questions.  She does not appear to be in distress at this time Vitamin B12 and TSH within normal limits.    Hypernatremia Gentle hydration with dextrose infusion at 50 mL/h and free water boluses via PEG tube. Repeat BMP shows normal sodium of 142. Stop the dextrose infusion and continue with free water flushes via the PEG.    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 Patient currently on Plavix and Crestor as recommended by neurology for prophylaxis.    Severe bilateral aspiration pneumonia in the setting of severe dysphagia Patient completed 9-day course of IV Zosyn.  Palliative care consulted for goals of care discussion.  Patient currently has PEG tube and has tube feeds running. Recurrent aspiration pneumonia on chest x-ray 04/21/2020 she was empirically started on IV Unasyn for aspiration pneumonia.  Blood cultures were done and negative so far RN reported that patient's PEG tube was clogged on April 23, 2020 IR consult never able to unclog the PEG tube and tube feeds are running back again.    Essential hypertension Blood pressure parameters are optimal Continue with amlodipine and metoprolol.   AKI Resolved with IV fluids.    Type 2 diabetes mellitus Hemoglobin A1c is 6.9 CBG (last 3)  Recent Labs    04/23/20 2352 04/24/20 0353 04/24/20 0735  GLUCAP 181* 137* 137*   Continue sliding scale insulin.  No changes in medications    Depression Continue with low-dose Remeron.  Left breast mass seen on  CT of the chest on 04/08/2020. Pending goals of care discussion.  Patient will need diagnostic mammogram as an outpatient.    Acute urinary retention Foley catheter placed on 04/05/2020.      Poor progression, multiple comorbidities, clinical deterioration, recurrent aspiration pneumonias, dysphagia, palliative care consulted for goals of  care discussion.  Palliative team unable to get in touch with the family for goals of care discussion.   DVT prophylaxis: (Heparin/) Code Status: full code.  Family Communication: none at bedside.  Called her son over the phone and left a message. Disposition:   Status is: Inpatient  Remains inpatient appropriate because:IV treatments appropriate due to intensity of illness or inability to take PO   Dispo:  Patient From:  home.   Planned Disposition:  SNF  Expected discharge date:   > 3 DAYS.   Medically stable for discharge:  NO         Consultants:   Palliative care.   Procedures: none.   Antimicrobials: Anti-infectives (From admission, onward)   Start     Dose/Rate Route Frequency Ordered Stop   04/21/20 1600  ampicillin-sulbactam (UNASYN) 1.5 g in sodium chloride 0.9 % 100 mL IVPB        1.5 g 200 mL/hr over 30 Minutes Intravenous Every 8 hours 04/21/20 1457     04/05/20 1800  vancomycin (VANCOREADY) IVPB 750 mg/150 mL  Status:  Discontinued        750 mg 150 mL/hr over 60 Minutes Intravenous Every 24 hours 04/04/20 1727 04/07/20 0805   04/04/20 1730  vancomycin (VANCOREADY) IVPB 1250 mg/250 mL        1,250 mg 166.7 mL/hr over 90 Minutes Intravenous  Once 04/04/20 1727 04/04/20 1948   04/04/20 1730  piperacillin-tazobactam (ZOSYN) IVPB 3.375 g  Status:  Discontinued        3.375 g 12.5 mL/hr over 240 Minutes Intravenous Every 8 hours 04/04/20 1727 04/12/20 1003   04/04/20 1715  piperacillin-tazobactam (ZOSYN) IVPB 3.375 g  Status:  Discontinued       Note to Pharmacy: Adjust as per renal function   3.375 g 100 mL/hr over 30 Minutes Intravenous Every 8 hours 04/04/20 1706 04/04/20 1726   03/31/20 1207  vancomycin (VANCOCIN) 1-5 GM/200ML-% IVPB       Note to Pharmacy: [name], [name]   : cabinet override      03/31/20 1207 03/31/20 1232   03/31/20 1200  vancomycin (VANCOCIN) IVPB 1000 mg/200 mL premix        1,000 mg 200 mL/hr over 60 Minutes Intravenous  Once  03/31/20 1155 03/31/20 1509   03/10/20 1200  cefTRIAXone (ROCEPHIN) 2 g in sodium chloride 0.9 % 100 mL IVPB        2 g 200 mL/hr over 30 Minutes Intravenous Every 24 hours 03/10/20 0924 03/14/20 1333   03/09/20 1000  ceFEPIme (MAXIPIME) 1 g in sodium chloride 0.9 % 100 mL IVPB  Status:  Discontinued        1 g 200 mL/hr over 30 Minutes Intravenous Every 24 hours 03/08/20 1219 03/10/20 0924   03/08/20 1219  vancomycin variable dose per unstable renal function (pharmacist dosing)  Status:  Discontinued         Does not apply See admin instructions 03/08/20 1219 03/10/20 0906   03/08/20 1100  vancomycin (VANCOCIN) IVPB 1000 mg/200 mL premix        1,000 mg 200 mL/hr over 60 Minutes Intravenous  Once 03/08/20 1046 03/08/20 1406   03/08/20 1100  ceFEPIme (MAXIPIME) 2 g in sodium chloride 0.9 % 100 mL IVPB        2 g 200 mL/hr over 30 Minutes Intravenous  Once 03/08/20 1046 03/08/20 1211         Subjective: Patient seen and examined at bedside this opening her eyes to verbal cues no new complaints at this time.  Objective: Vitals:   04/23/20 1954 04/23/20 2352 04/24/20 0354 04/24/20 0737  BP: 140/65 137/61 134/66 (!) 148/63  Pulse: 74 65 77 80  Resp: 16 17 17 18   Temp: 98.6 F (37 C) 98.4 F (36.9 C) 98.5 F (36.9 C) 97.8 F (36.6 C)  TempSrc: Oral Oral Oral Oral  SpO2: 100% 100% 99% 99%  Weight:      Height:        Intake/Output Summary (Last 24 hours) at 04/24/2020 0945 Last data filed at 04/24/2020 04/26/2020 Gross per 24 hour  Intake 1204.53 ml  Output 2125 ml  Net -920.47 ml   Filed Weights   04/21/20 0316 04/22/20 0316 04/23/20 0327  Weight: 35.8 kg 57.6 kg 58.5 kg    Examination:  General exam: Cachectic looking lady on 1 L of nasal cannula oxygen, Respiratory system: Air entry fair bilateral, no wheezing or rhonchi Cardiovascular system: S1-S2 heard, regular rate rhythm, no JVD Gastrointestinal system: Abdomen is soft, nontender, bowel sounds, PEG in  place Central nervous system: Opening eyes to verbal cues and touch.  Detailed exam could not be performed. Extremities: No pedal edema Skin: No rashes seen Psychiatry: Cannot be assessed    Data Reviewed: I have personally reviewed following labs and imaging studies  CBC: Recent Labs  Lab 04/18/20 0150 04/19/20 0155 04/20/20 0256 04/23/20 0314  WBC 11.6* 10.4 12.1* 9.1  NEUTROABS 8.2* 6.9 8.5* 4.9  HGB 7.7* 8.4* 8.6* 7.4*  HCT 26.1* 26.9* 28.9* 24.7*  MCV 98.9 97.5 98.0 98.4  PLT 330 424* 385 312    Basic Metabolic Panel: Recent Labs  Lab 04/19/20 0843 04/20/20 1006 04/21/20 1113 04/22/20 0907 04/23/20 0314  NA 145 150* 150* 142 141  K 4.5 4.4 4.2 4.3 3.9  CL 108 112* 113* 107 107  CO2 25 28 26 27 25   GLUCOSE 223* 261* 232* 176* 134*  BUN 40* 43* 50* 44* 36*  CREATININE 0.68 0.67 0.80 0.64 0.59  CALCIUM 9.3 9.4 9.1 8.9 8.8*    GFR: Estimated Creatinine Clearance: 54.1 mL/min (by C-G formula based on SCr of 0.59 mg/dL).  Liver Function Tests: No results for input(s): AST, ALT, ALKPHOS, BILITOT, PROT, ALBUMIN in the last 168 hours.  CBG: Recent Labs  Lab 04/23/20 1622 04/23/20 1950 04/23/20 2352 04/24/20 0353 04/24/20 0735  GLUCAP 109* 145* 181* 137* 137*     Recent Results (from the past 240 hour(s))  Culture, blood (Routine X 2) w Reflex to ID Panel     Status: None (Preliminary result)   Collection Time: 04/21/20  4:09 PM   Specimen: BLOOD  Result Value Ref Range Status   Specimen Description BLOOD LEFT ANTECUBITAL  Final   Special Requests   Final    BOTTLES DRAWN AEROBIC AND ANAEROBIC Blood Culture adequate volume   Culture   Final    NO GROWTH 2 DAYS Performed at [facility], [address], [place], [address] [place]    Report Status PENDING  Incomplete  Culture, blood (Routine X 2) w Reflex to ID Panel     Status: None (Preliminary result)   Collection Time: 04/21/20  4:12 PM   Specimen: BLOOD RIGHT ARM  Result Value Ref Range  Status   Specimen Description BLOOD RIGHT ARM  Final   Special Requests   Final    BOTTLES DRAWN AEROBIC AND ANAEROBIC Blood Culture adequate volume   Culture   Final    NO GROWTH 2 DAYS Performed at [facility], [address] [place], [place] [address]    Report Status PENDING  Incomplete         Radiology Studies: No results found.      Scheduled Meds: . amLODipine  10 mg Per Tube Daily  . chlorhexidine  15 mL Mouth Rinse BID  . Chlorhexidine Gluconate Cloth  6 each Topical Daily  . clopidogrel  75 mg Per Tube Daily  . feeding supplement (GLUCERNA 1.5 CAL)  960 mL Per Tube Q24H  . feeding supplement (PROSource TF)  45 mL Per Tube Daily  . free water  200 mL Per Tube Q4H  . heparin injection (subcutaneous)  5,000 Units Subcutaneous Q8H  . insulin aspart  0-9 Units Subcutaneous Q4H  . insulin detemir  10 Units Subcutaneous BID  . mouth rinse  15 mL Mouth Rinse BID  . metoprolol tartrate  25 mg Per Tube BID  . pantoprazole sodium  40 mg Per Tube Daily  . rosuvastatin  5 mg Per Tube Daily   Continuous Infusions: . sodium chloride 250 mL (04/22/20 2329)  . sodium chloride 50 mL/hr at 04/23/20 1536  . ampicillin-sulbactam (UNASYN) IV 1.5 g (04/24/20 0826)     LOS: 47 days        Kathlen Mody, MD [facility]   To contact the attending provider between 7A-7P or the covering provider during after hours 7P-7A, please log into the web site [email or web address] and access using universal [place] password for that web site. If you do not have the password, please call the hospital operator.  04/24/2020, 9:45 AM

## 2020-04-25 DIAGNOSIS — L8990 Pressure ulcer of unspecified site, unspecified stage: Secondary | ICD-10-CM | POA: Insufficient documentation

## 2020-04-25 LAB — GLUCOSE, CAPILLARY
Glucose-Capillary: 129 mg/dL — ABNORMAL HIGH (ref 70–99)
Glucose-Capillary: 144 mg/dL — ABNORMAL HIGH (ref 70–99)
Glucose-Capillary: 153 mg/dL — ABNORMAL HIGH (ref 70–99)
Glucose-Capillary: 153 mg/dL — ABNORMAL HIGH (ref 70–99)
Glucose-Capillary: 163 mg/dL — ABNORMAL HIGH (ref 70–99)
Glucose-Capillary: 168 mg/dL — ABNORMAL HIGH (ref 70–99)

## 2020-04-25 NOTE — Progress Notes (Signed)
PROGRESS NOTE    Tasha [name]  IZT:[id number] DOB: 1946/06/11 DOA: 03/08/2020 PCP: Patient, No Pcp Per    Chief Complaint  Patient presents with  . Emesis    Brief Narrative:   74 year old lady with prior history of type 2 diabetes, hypertension, MI, recent CVA in June 2021 presents in July fall 2021 with acute encephalopathy, intractable nausea and vomiting and was found to have AKI with lactic acidosis that improved with supportive care.  MRI of the brain subsequently on 03/14/2020 showed acute left pontine infarct and acute small infarct in the posterior right lateral ventricle.  Hospital course was complicated by severe dysphagia requiring initiation of tube feeds followed by PEG placement on 03/31/2020 as well as Zosyn for aspiration pneumonia.  Palliative care was consulted to assist in goals of care discussion due to poor patient's poor prognosis and meaningful recovery. Pt was febrile the night of 8/24, and CXR shows pneumonia. She was started on IV Unasyn for aspiration pneumonia. Blood cultures were done, negative so far.  Patient seen and examined at bed side, opening eyes to verbal cues.  She is weaned off oxygen and she is on room air with good sats.  Assessment & Plan:   Active Problems:   CVA (cerebral vascular accident) (HCC)   DM (diabetes mellitus), type 2 with peripheral vascular complications (HCC)   Essential (primary) hypertension   Lactic acidosis   Nausea and vomiting   AKI (acute kidney injury) (HCC)   Hyperkalemia   Sepsis (HCC)   Coffee ground emesis   Palliative care encounter   Pneumonia of both lungs due to infectious organism   Advanced care planning/counseling discussion   Goals of care, counseling/discussion   Palliative care by specialist   Dysphagia   Pressure injury of skin    Acute combined toxic and metabolic encephalopathy Multifactorial probably combination of acute CVA, hyponatremia, hospital-acquired delirium. EEG is  unrevealing. Vitamin B12 and TSH within normal limits. Today patient is alert, opens eyes to verbal cues, weaned off oxygen at this time.    Hypernatremia Gentle hydration with dextrose infusion at 50 mL/h and free water boluses via PEG tube. Repeat BMP shows normal sodium of 142. Stop the dextrose infusion and continue with free water flushes via the PEG.    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 Patient currently on Plavix and Crestor as recommended by neurology for prophylaxis.    Severe bilateral aspiration pneumonia in the setting of severe dysphagia Patient completed 9-day course of IV Zosyn.  Palliative care consulted for goals of care discussion.  Patient currently has PEG tube and has tube feeds running. Recurrent aspiration pneumonia on chest x-ray 04/21/2020 she was empirically started on IV Unasyn for aspiration pneumonia.  Blood cultures were done and negative so far we were able to wean her off the oxygen today. RN reported that patient's PEG tube was clogged on April 23, 2020 IR consult never able to unclog the PEG tube and tube feeds are running back again.  No more issues with PEG tube   Essential hypertension Blood pressures are well controlled, continue with amlodipine and metoprolol.   AKI Resolved with IV fluids.    Type 2 diabetes mellitus Hemoglobin A1c is 6.9 CBG (last 3)  Recent Labs    04/25/20 0417 04/25/20 0812 04/25/20 1204  GLUCAP 129* 163* 168*   Continue sliding scale insulin.  No changes in medications    Depression  Continue with low-dose Remeron.  Left breast mass seen on CT of the chest on 04/08/2020. Pending goals of care discussion.  Patient will need diagnostic mammogram as an outpatient.    Acute urinary retention Foley catheter placed on 04/05/2020.    Pressure injury  Pressure Injury 04/25/20 Heel Right;Left Stage 1 -  Intact skin with  non-blanchable redness of a localized area usually over a bony prominence. (Active)  04/25/20 1136  Location: Heel  Location Orientation: Right;Left  Staging: Stage 1 -  Intact skin with non-blanchable redness of a localized area usually over a bony prominence.  Wound Description (Comments):   Present on Admission:      Pressure Injury 04/25/20 Sacrum Right;Left;Medial Unstageable - Full thickness tissue loss in which the base of the injury is covered by slough (yellow, tan, gray, green or brown) and/or eschar (tan, brown or black) in the wound bed. (Active)  04/25/20 1137  Location: Sacrum  Location Orientation: Right;Left;Medial  Staging: Unstageable - Full thickness tissue loss in which the base of the injury is covered by slough (yellow, tan, gray, green or brown) and/or eschar (tan, brown or black) in the wound bed.  Wound Description (Comments):   Present on Admission:    Wound care will be consulted.      Poor progression, multiple comorbidities, clinical deterioration, recurrent aspiration pneumonias, dysphagia, palliative care consulted for goals of care discussion.  Palliative team unable to get in touch with the family for goals of care discussion.   DVT prophylaxis: (Heparin/) Code Status: full code.  Family Communication: none at bedside.  Called her son over the phone and left a message. Disposition:   Status is: Inpatient  Remains inpatient appropriate because:IV treatments appropriate due to intensity of illness or inability to take PO   Dispo:  Patient From:  home.   Planned Disposition:  SNF  Expected discharge date:   > 3 DAYS.   Medically stable for discharge:  NO         Consultants:   Palliative care.   Procedures: none.   Antimicrobials: Anti-infectives (From admission, onward)   Start     Dose/Rate Route Frequency Ordered Stop   04/21/20 1600  ampicillin-sulbactam (UNASYN) 1.5 g in sodium chloride 0.9 % 100 mL IVPB        1.5 g 200  mL/hr over 30 Minutes Intravenous Every 8 hours 04/21/20 1457     04/05/20 1800  vancomycin (VANCOREADY) IVPB 750 mg/150 mL  Status:  Discontinued        750 mg 150 mL/hr over 60 Minutes Intravenous Every 24 hours 04/04/20 1727 04/07/20 0805   04/04/20 1730  vancomycin (VANCOREADY) IVPB 1250 mg/250 mL        1,250 mg 166.7 mL/hr over 90 Minutes Intravenous  Once 04/04/20 1727 04/04/20 1948   04/04/20 1730  piperacillin-tazobactam (ZOSYN) IVPB 3.375 g  Status:  Discontinued        3.375 g 12.5 mL/hr over 240 Minutes Intravenous Every 8 hours 04/04/20 1727 04/12/20 1003   04/04/20 1715  piperacillin-tazobactam (ZOSYN) IVPB 3.375 g  Status:  Discontinued       Note to Pharmacy: Adjust as per renal function   3.375 g 100 mL/hr over 30 Minutes Intravenous Every 8 hours 04/04/20 1706 04/04/20 1726   03/31/20 1207  vancomycin (VANCOCIN) 1-5 GM/200ML-% IVPB       Note to Pharmacy: [name], [name]   : cabinet override      03/31/20 1207 03/31/20 1232   03/31/20  1200  vancomycin (VANCOCIN) IVPB 1000 mg/200 mL premix        1,000 mg 200 mL/hr over 60 Minutes Intravenous  Once 03/31/20 1155 03/31/20 1509   03/10/20 1200  cefTRIAXone (ROCEPHIN) 2 g in sodium chloride 0.9 % 100 mL IVPB        2 g 200 mL/hr over 30 Minutes Intravenous Every 24 hours 03/10/20 0924 03/14/20 1333   03/09/20 1000  ceFEPIme (MAXIPIME) 1 g in sodium chloride 0.9 % 100 mL IVPB  Status:  Discontinued        1 g 200 mL/hr over 30 Minutes Intravenous Every 24 hours 03/08/20 1219 03/10/20 0924   03/08/20 1219  vancomycin variable dose per unstable renal function (pharmacist dosing)  Status:  Discontinued         Does not apply See admin instructions 03/08/20 1219 03/10/20 0906   03/08/20 1100  vancomycin (VANCOCIN) IVPB 1000 mg/200 mL premix        1,000 mg 200 mL/hr over 60 Minutes Intravenous  Once 03/08/20 1046 03/08/20 1406   03/08/20 1100  ceFEPIme (MAXIPIME) 2 g in sodium chloride 0.9 % 100 mL IVPB        2 g 200 mL/hr  over 30 Minutes Intravenous  Once 03/08/20 1046 03/08/20 1211         Subjective: No new events overnight.  Objective: Vitals:   04/25/20 0419 04/25/20 0730 04/25/20 1043 04/25/20 1108  BP: (!) 137/59 (!) 125/58 (!) 132/59 139/62  Pulse: 74 80 90 81  Resp: 18 18  17   Temp: 98.8 F (37.1 C) 98 F (36.7 C)  99.2 F (37.3 C)  TempSrc: Oral   Axillary  SpO2: 97% 98%  99%  Weight:      Height:        Intake/Output Summary (Last 24 hours) at 04/25/2020 1327 Last data filed at 04/25/2020 0849 Gross per 24 hour  Intake 494.11 ml  Output 1900 ml  Net -1405.89 ml   Filed Weights   04/21/20 0316 04/22/20 0316 04/23/20 0327  Weight: 35.8 kg 57.6 kg 58.5 kg    Examination:  General exam: Cachectic looking lady, alert and comfortable on room air. Respiratory system: Bilateral air entry fair, no wheezing or rhonchi Cardiovascular system: S1-S2 heard, regular rate rhythm, no JVD, no pedal edema Gastrointestinal system: Abdomen is soft, nontender, nondistended bowel sounds heard PEG in place. Central nervous system: Alert, confused, Extremities: No cyanosis or clubbing Skin: No rashes seen, right-sided sacrum unstageable ulcer, left heel ulcer Psychiatry: Cannot be assessed    Data Reviewed: I have personally reviewed following labs and imaging studies  CBC: Recent Labs  Lab 04/19/20 0155 04/20/20 0256 04/23/20 0314  WBC 10.4 12.1* 9.1  NEUTROABS 6.9 8.5* 4.9  HGB 8.4* 8.6* 7.4*  HCT 26.9* 28.9* 24.7*  MCV 97.5 98.0 98.4  PLT 424* 385 312    Basic Metabolic Panel: Recent Labs  Lab 04/19/20 0843 04/20/20 1006 04/21/20 1113 04/22/20 0907 04/23/20 0314  NA 145 150* 150* 142 141  K 4.5 4.4 4.2 4.3 3.9  CL 108 112* 113* 107 107  CO2 25 28 26 27 25   GLUCOSE 223* 261* 232* 176* 134*  BUN 40* 43* 50* 44* 36*  CREATININE 0.68 0.67 0.80 0.64 0.59  CALCIUM 9.3 9.4 9.1 8.9 8.8*    GFR: Estimated Creatinine Clearance: 54.1 mL/min (by C-G formula based on SCr of  0.59 mg/dL).  Liver Function Tests: No results for input(s): AST, ALT, ALKPHOS, BILITOT, PROT, ALBUMIN  in the last 168 hours.  CBG: Recent Labs  Lab 04/24/20 2017 04/25/20 0001 04/25/20 0417 04/25/20 0812 04/25/20 1204  GLUCAP 154* 153* 129* 163* 168*     Recent Results (from the past 240 hour(s))  Culture, blood (Routine X 2) w Reflex to ID Panel     Status: None (Preliminary result)   Collection Time: 04/21/20  4:09 PM   Specimen: BLOOD  Result Value Ref Range Status   Specimen Description BLOOD LEFT ANTECUBITAL  Final   Special Requests   Final    BOTTLES DRAWN AEROBIC AND ANAEROBIC Blood Culture adequate volume   Culture   Final    NO GROWTH 3 DAYS Performed at [facility], [address], [place], [place] [address]    Report Status PENDING  Incomplete  Culture, blood (Routine X 2) w Reflex to ID Panel     Status: None (Preliminary result)   Collection Time: 04/21/20  4:12 PM   Specimen: BLOOD RIGHT ARM  Result Value Ref Range Status   Specimen Description BLOOD RIGHT ARM  Final   Special Requests   Final    BOTTLES DRAWN AEROBIC AND ANAEROBIC Blood Culture adequate volume   Culture   Final    NO GROWTH 3 DAYS Performed at [facility], [address], [place], [place] [address]    Report Status PENDING  Incomplete         Radiology Studies: No results found.      Scheduled Meds: . amLODipine  10 mg Per Tube Daily  . chlorhexidine  15 mL Mouth Rinse BID  . Chlorhexidine Gluconate Cloth  6 each Topical Daily  . clopidogrel  75 mg Per Tube Daily  . feeding supplement (GLUCERNA 1.5 CAL)  960 mL Per Tube Q24H  . feeding supplement (PROSource TF)  45 mL Per Tube Daily  . free water  200 mL Per Tube Q4H  . heparin injection (subcutaneous)  5,000 Units Subcutaneous Q8H  . insulin aspart  0-9 Units Subcutaneous Q4H  . insulin detemir  10 Units Subcutaneous BID  . mouth rinse  15 mL Mouth Rinse BID  . metoprolol tartrate  25 mg Per Tube BID    . pantoprazole sodium  40 mg Per Tube Daily  . rosuvastatin  5 mg Per Tube Daily   Continuous Infusions: . sodium chloride 250 mL (04/22/20 2329)  . sodium chloride 50 mL/hr at 04/25/20 0015  . ampicillin-sulbactam (UNASYN) IV 1.5 g (04/25/20 0818)     LOS: 48 days        Kathlen Mody, MD [facility]   To contact the attending provider between 7A-7P or the covering provider during after hours 7P-7A, please log into the web site [email or web address] and access using universal [place] password for that web site. If you do not have the password, please call the hospital operator.  04/25/2020, 1:27 PM

## 2020-04-26 LAB — GLUCOSE, CAPILLARY
Glucose-Capillary: 111 mg/dL — ABNORMAL HIGH (ref 70–99)
Glucose-Capillary: 141 mg/dL — ABNORMAL HIGH (ref 70–99)
Glucose-Capillary: 142 mg/dL — ABNORMAL HIGH (ref 70–99)
Glucose-Capillary: 148 mg/dL — ABNORMAL HIGH (ref 70–99)
Glucose-Capillary: 152 mg/dL — ABNORMAL HIGH (ref 70–99)
Glucose-Capillary: 154 mg/dL — ABNORMAL HIGH (ref 70–99)
Glucose-Capillary: 183 mg/dL — ABNORMAL HIGH (ref 70–99)

## 2020-04-26 LAB — CBC
HCT: 26.1 % — ABNORMAL LOW (ref 36.0–46.0)
Hemoglobin: 8.3 g/dL — ABNORMAL LOW (ref 12.0–15.0)
MCH: 30.9 pg (ref 26.0–34.0)
MCHC: 31.8 g/dL (ref 30.0–36.0)
MCV: 97 fL (ref 80.0–100.0)
Platelets: 325 10*3/uL (ref 150–400)
RBC: 2.69 MIL/uL — ABNORMAL LOW (ref 3.87–5.11)
RDW: 18.5 % — ABNORMAL HIGH (ref 11.5–15.5)
WBC: 9.6 10*3/uL (ref 4.0–10.5)
nRBC: 0 % (ref 0.0–0.2)

## 2020-04-26 LAB — CULTURE, BLOOD (ROUTINE X 2)
Culture: NO GROWTH
Culture: NO GROWTH
Special Requests: ADEQUATE
Special Requests: ADEQUATE

## 2020-04-26 LAB — BASIC METABOLIC PANEL
Anion gap: 10 (ref 5–15)
BUN: 22 mg/dL (ref 8–23)
CO2: 21 mmol/L — ABNORMAL LOW (ref 22–32)
Calcium: 8.7 mg/dL — ABNORMAL LOW (ref 8.9–10.3)
Chloride: 107 mmol/L (ref 98–111)
Creatinine, Ser: 0.56 mg/dL (ref 0.44–1.00)
GFR calc Af Amer: >60 mL/min (ref >60)
GFR calc non Af Amer: >60 mL/min (ref >60)
Glucose, Bld: 181 mg/dL — ABNORMAL HIGH (ref 70–99)
Potassium: 3.8 mmol/L (ref 3.5–5.1)
Sodium: 138 mmol/L (ref 135–145)

## 2020-04-26 LAB — SARS CORONAVIRUS 2 BY RT PCR (HOSPITAL ORDER, PERFORMED IN ~~LOC~~ HOSPITAL LAB): SARS Coronavirus 2: NEGATIVE

## 2020-04-26 NOTE — Consult Note (Signed)
WOC Nurse Consult Note: Reason for Consult: Deep tissue pressure injury (DTPI) to coccyx. Stage 2 pressure injuries to bilateral heels (intact serum-filled blisters), L>R. Wound type:Pressure Pressure Injury POA: No Measurement: Coccygeal:  Deep Tissue Pressure Injury (DTPI): 3cm x 2cm with depth unable to be determined. Purple discoloration to skin with macerated skin that is easily brushed away with the tip of a cotton-tipped applicator. This area may further evolve and demarcate and reveal depth at a later date. Left lateral heel with 5cm x 5cm serum filled blister (Stage 2) Right heel: 2cm round serum filled blister (Stage 2) Reabsorbing. Wound bed:As described above Drainage (amount, consistency, odor) None Periwound: intact Dressing procedure/placement/frequency: Patient is on a mattress replacement with low air loss feature and is bring turned and repositioned from side to side. She is wearing bilateral pressure distribution heel boots. I will provide a topical POC that will include a drying agent to the coccygeal and bilateral heels (single layer of xeroform gauze) prior to placement of silicone foam dressings.   WOC nursing team will follow seeing patient every 7-10 days, and will remain available to this patient, the nursing and medical teams.  Please re-consult if needed in-between visits.  Thanks, Ladona Mow, MSN, RN, GNP, [place]  Pager# [phone]

## 2020-04-26 NOTE — TOC Progression Note (Signed)
Transition of [facility]) - Progression Note    Patient Details  Name: Tasha [name] MRN: [id number] Date of Birth: 1945/09/15  [facility] CM/SW Contact  Baldemar [place], [place] Phone Number: 04/26/2020, 1:41 PM  Clinical Narrative:   CSW reached out to [facility] about patient returning. [facility] initially planned to take patient back today, but then called back that they've had a staff member test positive for COVID and will not have staffing for patient to return today. Patient can return to [facility]. CSW notified MD and RN.    Expected Discharge Plan: Skilled Nursing Facility Barriers to Discharge: Continued Medical Work up  Expected Discharge Plan and Services Expected Discharge Plan: Skilled Nursing Facility     Post Acute Care Choice: Nursing Home, Resumption of Svcs/PTA Provider   Expected Discharge Date:  (unknown)                                     Social Determinants of Health (SDOH) Interventions    Readmission Risk Interventions No flowsheet data found.

## 2020-04-26 NOTE — Progress Notes (Signed)
PROGRESS NOTE    Towanda OctaveBetsy Morrill  UJW:[id number] DOB: 11/01/1945 DOA: 03/08/2020 PCP: Patient, No Pcp Per    Chief Complaint  Patient presents with   Emesis    Brief Narrative:   74 year old lady with prior history of type 2 diabetes, hypertension, MI, recent CVA in June 2021 presents in July fall 2021 with acute encephalopathy, intractable nausea and vomiting and was found to have AKI with lactic acidosis that improved with supportive care.  MRI of the brain subsequently on 03/14/2020 showed acute left pontine infarct and acute small infarct in the posterior right lateral ventricle.  Hospital course was complicated by severe dysphagia requiring initiation of tube feeds followed by PEG placement on 03/31/2020 as well as Zosyn for aspiration pneumonia.  Palliative care was consulted to assist in goals of care discussion due to poor patient's poor prognosis and meaningful recovery. Pt was febrile the night of 8/24, and CXR shows pneumonia. She was started on IV Unasyn for aspiration pneumonia. Blood cultures were done, negative so far.  Patient seen and examined at bedside opens eyes to verbal cues.  She is weaned off oxygen.  She appears to be at baseline.  Completed the course of Unasyn for aspiration pneumonia.  Assessment & Plan:   Active Problems:   CVA (cerebral vascular accident) (HCC)   DM (diabetes mellitus), type 2 with peripheral vascular complications (HCC)   Essential (primary) hypertension   Lactic acidosis   Nausea and vomiting   AKI (acute kidney injury) (HCC)   Hyperkalemia   Sepsis (HCC)   Coffee ground emesis   Palliative care encounter   Pneumonia of both lungs due to infectious organism   Advanced care planning/counseling discussion   Goals of care, counseling/discussion   Palliative care by specialist   Dysphagia   Pressure injury of skin    Acute combined toxic and metabolic encephalopathy Multifactorial probably combination of acute CVA, hyponatremia,  hospital-acquired delirium. EEG is unrevealing. Vitamin B12 and TSH within normal limits. Patient appears to be alert, not in any kind of distress.    Hypernatremia Gentle hydration with dextrose infusion at 50 mL/h and free water boluses via PEG tube. Repeat BMP shows sodium of 138. Stop the dextrose infusion and continue with free water flushes via the PEG.    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 Patient currently on Plavix and Crestor as recommended by neurology for prophylaxis.    Severe bilateral aspiration pneumonia in the setting of severe dysphagia Patient completed 9-day course of IV Zosyn.  Palliative care consulted for goals of care discussion.  Patient currently has PEG tube and has tube feeds running. Recurrent aspiration pneumonia on chest x-ray 04/21/2020 she was empirically started on IV Unasyn for aspiration pneumonia.  Blood cultures were done and negative so far we were able to wean her off the oxygen today. RN reported that patient's PEG tube was clogged on April 23, 2020 IR consult never able to unclog the PEG tube and tube feeds are running back again.  No more issues with PEG tube Completed the course of Unasyn for aspiration pneumonia.   Essential hypertension Sugars are very well controlled, continue with amlodipine and metoprolol.  AKI Resolved with IV fluids.    Type 2 diabetes mellitus Hemoglobin A1c is 6.9 CBG (last 3)  Recent Labs    04/26/20 0358 04/26/20 0751 04/26/20 1233  GLUCAP 152* 111* 154*   Continue sliding scale insulin.  No changes in medications    Depression Continue with low-dose Remeron.  Left breast mass seen on CT of the chest on 04/08/2020. Pending goals of care discussion.  Patient will need diagnostic mammogram as an outpatient.    Acute urinary retention Foley catheter placed on 04/05/2020.    Pressure injury:   Pressure  Injury 04/25/20 Heel Right;Left Stage 1 -  Intact skin with non-blanchable redness of a localized area usually over a bony prominence. (Active)  04/25/20 1136  Location: Heel  Location Orientation: Right;Left  Staging: Stage 1 -  Intact skin with non-blanchable redness of a localized area usually over a bony prominence.  Wound Description (Comments):   Present on Admission:      Pressure Injury 04/25/20 Sacrum Right;Left;Medial Unstageable - Full thickness tissue loss in which the base of the injury is covered by slough (yellow, tan, gray, green or brown) and/or eschar (tan, brown or black) in the wound bed. (Active)  04/25/20 1137  Location: Sacrum  Location Orientation: Right;Left;Medial  Staging: Unstageable - Full thickness tissue loss in which the base of the injury is covered by slough (yellow, tan, gray, green or brown) and/or eschar (tan, brown or black) in the wound bed.  Wound Description (Comments):   Present on Admission:    Wound care consulted patient has 3 cm x 2 cm coccygeal deep tissue pressure injury.  And left lateral heel serum filled blisters, stage II and stage II right heel serum filled blister.  Patient is currently on a mattress replacement with low air loss feature being turned and repositioned from side to side.  She is wearing bilateral pressure distribution heel boots.     Poor progression, multiple comorbidities, clinical deterioration, recurrent aspiration pneumonias, dysphagia, palliative care consulted for goals of care discussion.  Palliative team unable to get in touch with the family for goals of care discussion.   DVT prophylaxis: (Heparin/) Code Status: full code.  Family Communication: none at bedside.  Unable to reach her son at this time. Disposition:   Status is: Inpatient  Remains inpatient appropriate because:IV treatments appropriate due to intensity of illness or inability to take PO   Dispo:  Patient From:  home.   Planned  Disposition:  SNF  Expected discharge date:   1 DAYS.   Medically stable for discharge:   yes         Consultants:   Palliative care.   Procedures: none.   Antimicrobials: Anti-infectives (From admission, onward)   Start     Dose/Rate Route Frequency Ordered Stop   04/21/20 1600  ampicillin-sulbactam (UNASYN) 1.5 g in sodium chloride 0.9 % 100 mL IVPB  Status:  Discontinued        1.5 g 200 mL/hr over 30 Minutes Intravenous Every 8 hours 04/21/20 1457 04/26/20 0938   04/05/20 1800  vancomycin (VANCOREADY) IVPB 750 mg/150 mL  Status:  Discontinued        750 mg 150 mL/hr over 60 Minutes Intravenous Every 24 hours 04/04/20 1727 04/07/20 0805   04/04/20 1730  vancomycin (VANCOREADY) IVPB 1250 mg/250 mL        1,250 mg 166.7 mL/hr over 90 Minutes Intravenous  Once 04/04/20 1727 04/04/20 1948   04/04/20 1730  piperacillin-tazobactam (ZOSYN) IVPB 3.375 g  Status:  Discontinued        3.375 g 12.5 mL/hr over 240 Minutes Intravenous Every 8 hours 04/04/20 1727 04/12/20 1003   04/04/20 1715  piperacillin-tazobactam (ZOSYN) IVPB 3.375 g  Status:  Discontinued  Note to Pharmacy: Adjust as per renal function   3.375 g 100 mL/hr over 30 Minutes Intravenous Every 8 hours 04/04/20 1706 04/04/20 1726   03/31/20 1207  vancomycin (VANCOCIN) 1-5 GM/200ML-% IVPB       Note to Pharmacy: Erie Noe   : cabinet override      03/31/20 1207 03/31/20 1232   03/31/20 1200  vancomycin (VANCOCIN) IVPB 1000 mg/200 mL premix        1,000 mg 200 mL/hr over 60 Minutes Intravenous  Once 03/31/20 1155 03/31/20 1509   03/10/20 1200  cefTRIAXone (ROCEPHIN) 2 g in sodium chloride 0.9 % 100 mL IVPB        2 g 200 mL/hr over 30 Minutes Intravenous Every 24 hours 03/10/20 0924 03/14/20 1333   03/09/20 1000  ceFEPIme (MAXIPIME) 1 g in sodium chloride 0.9 % 100 mL IVPB  Status:  Discontinued        1 g 200 mL/hr over 30 Minutes Intravenous Every 24 hours 03/08/20 1219 03/10/20 0924   03/08/20 1219   vancomycin variable dose per unstable renal function (pharmacist dosing)  Status:  Discontinued         Does not apply See admin instructions 03/08/20 1219 03/10/20 0906   03/08/20 1100  vancomycin (VANCOCIN) IVPB 1000 mg/200 mL premix        1,000 mg 200 mL/hr over 60 Minutes Intravenous  Once 03/08/20 1046 03/08/20 1406   03/08/20 1100  ceFEPIme (MAXIPIME) 2 g in sodium chloride 0.9 % 100 mL IVPB        2 g 200 mL/hr over 30 Minutes Intravenous  Once 03/08/20 1046 03/08/20 1211         Subjective: No events overnight.  Objective: Vitals:   04/26/20 0400 04/26/20 0500 04/26/20 0748 04/26/20 1227  BP: (!) 139/57  136/61 136/62  Pulse: 80  82 72  Resp: 17  16 16   Temp: 98.9 F (37.2 C)  98.9 F (37.2 C) 98.8 F (37.1 C)  TempSrc: Oral  Oral Oral  SpO2: 97%  98% 98%  Weight:  60.8 kg    Height:        Intake/Output Summary (Last 24 hours) at 04/26/2020 1412 Last data filed at 04/26/2020 1100 Gross per 24 hour  Intake --  Output 1750 ml  Net -1750 ml   Filed Weights   04/22/20 0316 04/23/20 0327 04/26/20 0500  Weight: 57.6 kg 58.5 kg 60.8 kg    Examination:  General exam: Elderly lady, cachectic looking appears alert and comfortable. Respiratory system: Managed air entry at bases, no wheezing or rhonchi or tachypnea. Cardiovascular system: 1 S2 heard, regular rate rhythm, no JVD  gastrointestinal system: Diminished soft, nontender, nondistended, bowel sounds within normal limits PEG in place Central nervous system: Alert and confused Extremities: No pedal edema Skin: Coccygeal ulcer and left heel blister, right heel blister. Psychiatry: Cannot be assessed    Data Reviewed: I have personally reviewed following labs and imaging studies  CBC: Recent Labs  Lab 04/20/20 0256 04/23/20 0314 04/26/20 0101  WBC 12.1* 9.1 9.6  NEUTROABS 8.5* 4.9  --   HGB 8.6* 7.4* 8.3*  HCT 28.9* 24.7* 26.1*  MCV 98.0 98.4 97.0  PLT 385 312 325    Basic Metabolic  Panel: Recent Labs  Lab 04/20/20 1006 04/21/20 1113 04/22/20 0907 04/23/20 0314 04/26/20 0101  NA 150* 150* 142 141 138  K 4.4 4.2 4.3 3.9 3.8  CL 112* 113* 107 107 107  CO2 28 26  27 25 21*  GLUCOSE 261* 232* 176* 134* 181*  BUN 43* 50* 44* 36* 22  CREATININE 0.67 0.80 0.64 0.59 0.56  CALCIUM 9.4 9.1 8.9 8.8* 8.7*    GFR: Estimated Creatinine Clearance: 54.1 mL/min (by C-G formula based on SCr of 0.56 mg/dL).  Liver Function Tests: No results for input(s): AST, ALT, ALKPHOS, BILITOT, PROT, ALBUMIN in the last 168 hours.  CBG: Recent Labs  Lab 04/25/20 1933 04/25/20 2359 04/26/20 0358 04/26/20 0751 04/26/20 1233  GLUCAP 144* 183* 152* 111* 154*     Recent Results (from the past 240 hour(s))  Culture, blood (Routine X 2) w Reflex to ID Panel     Status: None   Collection Time: 04/21/20  4:09 PM   Specimen: BLOOD  Result Value Ref Range Status   Specimen Description BLOOD LEFT ANTECUBITAL  Final   Special Requests   Final    BOTTLES DRAWN AEROBIC AND ANAEROBIC Blood Culture adequate volume   Culture   Final    NO GROWTH 5 DAYS Performed at [facility], [address], [place], [place] [address]    Report Status 04/26/2020 FINAL  Final  Culture, blood (Routine X 2) w Reflex to ID Panel     Status: None   Collection Time: 04/21/20  4:12 PM   Specimen: BLOOD RIGHT ARM  Result Value Ref Range Status   Specimen Description BLOOD RIGHT ARM  Final   Special Requests   Final    BOTTLES DRAWN AEROBIC AND ANAEROBIC Blood Culture adequate volume   Culture   Final    NO GROWTH 5 DAYS Performed at [facility], [address] [place], [place] [address]    Report Status 04/26/2020 FINAL  Final  SARS Coronavirus 2 by RT PCR (hospital order, performed in [facility] hospital lab) Nasopharyngeal Nasopharyngeal Swab     Status: None   Collection Time: 04/26/20 11:52 AM   Specimen: Nasopharyngeal Swab  Result Value Ref Range Status   SARS Coronavirus 2  NEGATIVE NEGATIVE Final    Comment: (NOTE) SARS-CoV-2 target nucleic acids are NOT DETECTED.  The SARS-CoV-2 RNA is generally detectable in upper and lower respiratory specimens during the acute phase of infection. The lowest concentration of SARS-CoV-2 viral copies this assay can detect is 250 copies / mL. A negative result does not preclude SARS-CoV-2 infection and should not be used as the sole basis for treatment or other patient management decisions.  A negative result may occur with improper specimen collection / handling, submission of specimen other than nasopharyngeal swab, presence of viral mutation(s) within the areas targeted by this assay, and inadequate number of viral copies (<250 copies / mL). A negative result must be combined with clinical observations, patient history, and epidemiological information.  Fact Sheet for Patients:   BoilerBrush.com.cy  Fact Sheet for Healthcare Providers: https://pope.com/  This test is not yet approved or  cleared by the [place] FDA and has been authorized for detection and/or diagnosis of SARS-CoV-2 by FDA under an Emergency Use Authorization (EUA).  This EUA will remain in effect (meaning this test can be used) for the duration of the COVID-19 declaration under Section 564(b)(1) of the Act, 21 U.S.C. section 360bbb-3(b)(1), unless the authorization is terminated or revoked sooner.  Performed at [facility], [address] [place], [place] [address]          Radiology Studies: No results found.      Scheduled Meds:  amLODipine  10 mg Per Tube Daily   chlorhexidine  15 mL Mouth Rinse BID   Chlorhexidine Gluconate Cloth  6 each Topical Daily   clopidogrel  75 mg Per Tube Daily   feeding supplement (GLUCERNA 1.5 CAL)  960 mL Per Tube Q24H   feeding supplement (PROSource TF)  45 mL Per Tube Daily   free water  200 mL Per Tube Q4H   heparin injection  (subcutaneous)  5,000 Units Subcutaneous Q8H   insulin aspart  0-9 Units Subcutaneous Q4H   insulin detemir  10 Units Subcutaneous BID   mouth rinse  15 mL Mouth Rinse BID   metoprolol tartrate  25 mg Per Tube BID   pantoprazole sodium  40 mg Per Tube Daily   rosuvastatin  5 mg Per Tube Daily   Continuous Infusions:  sodium chloride 250 mL (04/22/20 2329)   sodium chloride 50 mL/hr at 04/25/20 1824     LOS: 49 days        Kathlen Mody, MD [facility]   To contact the attending provider between 7A-7P or the covering provider during after hours 7P-7A, please log into the web site [email or web address] and access using universal [place] password for that web site. If you do not have the password, please call the hospital operator.  04/26/2020, 2:12 PM

## 2020-04-26 NOTE — NC FL2 (Signed)
[place] MEDICAID FL2 LEVEL OF CARE SCREENING TOOL     IDENTIFICATION  Patient Name: Tasha [name] Birthdate: [birth year] Sex: female Admission Date (Current Location): 03/08/2020  [facility] and [place] Number:  Producer, television/film/video and Address:  [facility], [address], [place], [place] [address]      Provider Number: [id number]  Attending Physician Name and Address:  Kathlen Mody, MD  Relative Name and Phone Number:       Current Level of Care: Hospital Recommended Level of Care: Skilled Nursing Facility Prior Approval Number:    Date Approved/Denied:   PASRR Number: [id number]  Discharge Plan: SNF    Current Diagnoses: Patient Active Problem List   Diagnosis Date Noted   Pressure injury of skin 04/25/2020   Dysphagia    Pneumonia of both lungs due to infectious organism    Advanced care planning/counseling discussion    Goals of care, counseling/discussion    Palliative care by specialist    Palliative care encounter    Hyperkalemia    Sepsis (HCC)    Coffee ground emesis    Lactic acidosis 03/08/2020   Essential (primary) hypertension    History of falling    Diabetes mellitus without complication (HCC)    Nausea and vomiting    AKI (acute kidney injury) (HCC)    DM (diabetes mellitus), type 2 with peripheral vascular complications (HCC) 02/20/2020   Atherosclerosis of aorta (HCC) 02/20/2020   Poorly-controlled hypertension 02/20/2020   Dyslipidemia 02/20/2020   Fall 02/15/2020   CVA (cerebral vascular accident) (HCC) 02/15/2020    Orientation RESPIRATION BLADDER Height & Weight     Self  Normal Incontinent Weight: 134 lb (60.8 kg) Height:  5\' 4"  (162.6 cm)  BEHAVIORAL SYMPTOMS/MOOD NEUROLOGICAL BOWEL NUTRITION STATUS      Incontinent Feeding tube (Glucerna 1.5)  AMBULATORY STATUS COMMUNICATION OF NEEDS Skin   Total Care Does not communicate PU Stage and Appropriate Care PU Stage 1 Dressing:  (left  heel, foam dressing: lift every shift to assess, change PRN; sacrum, foam dressing: lift every shift to assess, change PRN)                     Personal Care Assistance Level of Assistance  Bathing, Feeding, Dressing Bathing Assistance: Maximum assistance Feeding assistance: Maximum assistance Dressing Assistance: Maximum assistance     Functional Limitations Info  Speech Sight Info: Adequate Hearing Info: Adequate Speech Info: Impaired (mute)    SPECIAL CARE FACTORS FREQUENCY  OT (By licensed OT), PT (By licensed PT)     PT Frequency: 5x weekly OT Frequency: 5x weekly            Contractures Contractures Info: Not present    Additional Factors Info  Code Status, Allergies, Insulin Sliding Scale Code Status Info: Full Allergies Info: Allopurinol, Tetracycline, Amoxicillin-pot Clavulanate, Atorvastatin, Azithromycin, Codeine, Lisinopril, Sulfa Antibiotics   Insulin Sliding Scale Info: 0-9 units every 4 hours; Levemir 10 units 2x/day       Current Medications (04/26/2020):  This is the current hospital active medication list Current Facility-Administered Medications  Medication Dose Route Frequency Provider Last Rate Last Admin   0.9 %  sodium chloride infusion   Intravenous PRN 04/28/2020, MD 10 mL/hr at 04/22/20 2329 250 mL at 04/22/20 2329   0.9 %  sodium chloride infusion   Intravenous Continuous 04/24/20, MD 50 mL/hr at 04/25/20 1824 New Bag at 04/25/20 1824   acetaminophen (TYLENOL) 160 MG/5ML solution 650  mg  650 mg Per Tube Q6H PRN Zigmund Daniel., MD   650 mg at 04/17/20 0925   amLODipine (NORVASC) tablet 10 mg  10 mg Per Tube Daily Dahal, Melina Schools, MD   10 mg at 04/26/20 0828   chlorhexidine (PERIDEX) 0.12 % solution 15 mL  15 mL Mouth Rinse BID Zigmund Daniel., MD   15 mL at 04/26/20 2376   Chlorhexidine Gluconate Cloth 2 % PADS 6 each  6 each Topical Daily Burnadette Pop, MD   6 each at 04/26/20 0831   clopidogrel (PLAVIX) tablet  75 mg  75 mg Per Tube Daily Tamera Reason, RPH   75 mg at 04/26/20 2831   feeding supplement (GLUCERNA 1.5 CAL) liquid 960 mL  960 mL Per Tube Q24H Dahal, Melina Schools, MD   960 mL at 04/25/20 2215   feeding supplement (PROSource TF) liquid 45 mL  45 mL Per Tube Daily Dahal, Melina Schools, MD   45 mL at 04/26/20 0828   free water 200 mL  200 mL Per Tube Q4H Kathlen Mody, MD   200 mL at 04/26/20 1249   heparin injection 5,000 Units  5,000 Units Subcutaneous Q8H Burnadette Pop, MD   5,000 Units at 04/26/20 5176   insulin aspart (novoLOG) injection 0-9 Units  0-9 Units Subcutaneous Q4H John Giovanni, MD   2 Units at 04/26/20 1247   insulin detemir (LEVEMIR) injection 10 Units  10 Units Subcutaneous BID Burnadette Pop, MD   10 Units at 04/26/20 1607   lip balm (CARMEX) ointment 1 application  1 application Topical PRN Zigmund Daniel., MD   1 application at 04/12/20 1749   MEDLINE mouth rinse  15 mL Mouth Rinse BID Zigmund Daniel., MD   15 mL at 04/26/20 3710   metoprolol tartrate (LOPRESSOR) 25 mg/10 mL oral suspension 25 mg  25 mg Per Tube BID Lorin Glass, MD   25 mg at 04/26/20 0827   pantoprazole sodium (PROTONIX) 40 mg/20 mL oral suspension 40 mg  40 mg Per Tube Daily Zigmund Daniel., MD   40 mg at 04/26/20 0827   polyethylene glycol (MIRALAX / GLYCOLAX) packet 17 g  17 g Per Tube Daily PRN Kai Levins, [facility]       Resource ThickenUp Clear   Oral PRN Zigmund Daniel., MD       rosuvastatin (CRESTOR) tablet 5 mg  5 mg Per Tube Daily Lonia Blood, MD   5 mg at 04/26/20 6269     Discharge Medications: Please see discharge summary for a list of discharge medications.  Relevant Imaging Results:  Relevant Lab Results:   Additional Information SS#: [id number]  [place], [place]

## 2020-04-27 ENCOUNTER — Encounter: Payer: Self-pay | Admitting: Internal Medicine

## 2020-04-27 ENCOUNTER — Non-Acute Institutional Stay (SKILLED_NURSING_FACILITY): Payer: Self-pay | Admitting: Internal Medicine

## 2020-04-27 DIAGNOSIS — L89890 Pressure ulcer of other site, unstageable: Secondary | ICD-10-CM

## 2020-04-27 DIAGNOSIS — I10 Essential (primary) hypertension: Secondary | ICD-10-CM

## 2020-04-27 DIAGNOSIS — N179 Acute kidney failure, unspecified: Secondary | ICD-10-CM

## 2020-04-27 DIAGNOSIS — I6300 Cerebral infarction due to thrombosis of unspecified precerebral artery: Secondary | ICD-10-CM

## 2020-04-27 DIAGNOSIS — E1151 Type 2 diabetes mellitus with diabetic peripheral angiopathy without gangrene: Secondary | ICD-10-CM

## 2020-04-27 DIAGNOSIS — J189 Pneumonia, unspecified organism: Secondary | ICD-10-CM

## 2020-04-27 LAB — GLUCOSE, CAPILLARY
Glucose-Capillary: 150 mg/dL — ABNORMAL HIGH (ref 70–99)
Glucose-Capillary: 181 mg/dL — ABNORMAL HIGH (ref 70–99)
Glucose-Capillary: 171 mg/dL — ABNORMAL HIGH (ref 70–99)

## 2020-04-27 MED ORDER — GLUCERNA 1.5 CAL PO LIQD: 960.0000 mL | ORAL | Status: DC

## 2020-04-27 MED ORDER — RESOURCE THICKENUP CLEAR PO POWD: ORAL | Status: DC

## 2020-04-27 MED ORDER — POLYETHYLENE GLYCOL 3350 17 G PO PACK: 17.0000 g | PACK | Freq: Every day | ORAL | 0 refills | Status: AC | PRN

## 2020-04-27 MED ORDER — ROSUVASTATIN CALCIUM 5 MG PO TABS: 5.0000 mg | ORAL_TABLET | Freq: Every day | ORAL | Status: AC

## 2020-04-27 MED ORDER — PROSOURCE TF PO LIQD: 45.0000 mL | Freq: Every day | ORAL | Status: DC

## 2020-04-27 MED ORDER — INSULIN DETEMIR 100 UNIT/ML ~~LOC~~ SOLN: 10.0000 [IU] | Freq: Two times a day (BID) | SUBCUTANEOUS | 11 refills | Status: DC

## 2020-04-27 MED ORDER — FREE WATER: 200.0000 mL | Status: DC

## 2020-04-27 MED ORDER — CLOPIDOGREL BISULFATE 75 MG PO TABS: 75.0000 mg | ORAL_TABLET | Freq: Every day | ORAL | Status: AC

## 2020-04-27 MED ORDER — METOPROLOL TARTRATE 25 MG/10 ML ORAL SUSPENSION: 25.0000 mg | Freq: Two times a day (BID) | ORAL | Status: DC

## 2020-04-27 MED ORDER — PANTOPRAZOLE SODIUM 40 MG PO PACK: 40.0000 mg | PACK | Freq: Every day | ORAL | Status: AC

## 2020-04-27 MED ORDER — INSULIN ASPART 100 UNIT/ML ~~LOC~~ SOLN: SUBCUTANEOUS | 11 refills | Status: DC

## 2020-04-27 NOTE — Assessment & Plan Note (Signed)
BP controlled; no change in antihypertensive medications  

## 2020-04-27 NOTE — Care Management Important Message (Signed)
Important Message  Patient Details  Name: Tasha [name] MRN: [id number] Date of Birth: 1945/10/14   Medicare Important Message Given:  Yes  Patient left prior to IM delivery.  IM mailed to the patient home.    Loreda Silverio 04/27/2020, 1:23 PM

## 2020-04-27 NOTE — Progress Notes (Signed)
NURSING HOME LOCATION:  [facility] ROOM NUMBER:  [id number]  CODE STATUS:  FULL CODE  PCP:  No PCP listed in chart; patient unable to verify  This is a comprehensive admission note to [facility] performed on this date less than 30 days from date of admission. Included are preadmission medical/surgical history; reconciled medication list; family history; social history and comprehensive review of systems.  Corrections and additions to the records were documented. Comprehensive physical exam was also performed. Additionally a clinical summary was entered for each active diagnosis pertinent to this admission in the Problem List to enhance continuity of care.  HPI: She was hospitalized 7/12-8/31/2021 presenting with acute encephalopathy associated with intractable nausea and vomiting complicated by AKI and lactic acidosis.  EEG was nondiagnostic.  Vitamin B12 and TSH were normal. 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  Subsequently she received DAPT for 3 weeks.  The stroke was complicated by severe dysphagia complicated by aspiration pneumonia for which she received Zosyn.Incidental finding on CT of the chest 8/12 was a left breast mass.  Diagnostic mammogram as an outpatient was recommended.  PEG placement was completed 8/4.  Because of her multiple comorbidities and complications; palliative care was consulted. On 8/24 the patient was febrile and chest x-ray demonstrated pneumonia.  IV Unasyn was initiated for recurrent aspiration pneumonia.  She was able to be weaned from supportive oxygen. Hospital course was complicated by hypernatremia which was corrected with gentle hydration and free water boluses via the PEG tube.  At discharge sodium was 138.  AKI also resolved with IV fluids. Course was also complicated by bilateral stage I heel ulcers.  Additionally she had unstageable sacral ulcers.  One  ulcer was 3 x 2 cm in the coccyx area.  Bilateral pressure distribution heel boots were employed. Because of urinary incontinence Foley catheter was placed 8/9. 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  She was to continue on Plavix and Crestor prophylactically.  Past medical and surgical history: Includes history of MI, essential hypertension, depression & diabetes with neurologic complications. Surgeries and procedures include abdominal hysterectomy and back surgery.  Social history: Former social drinker; never smoked. Family history: History not on file; patient unable to provide such.   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  She had great difficulty following any commands.  She repeatedly gave the date as the date of her birthday.  When I asked her the present year she said "2020".  When I asked her if she were having any issues, her response was "fine".  The answer to review of systems was a repetitive,monosyllable "no".  When I asked her why she had been in the hospital her response was "I have had a"., but she could not finish the thought process.  Constitutional: No fever, significant weight change, fatigue  Eyes: No redness, discharge, pain, vision change ENT/mouth: No nasal congestion, purulent discharge, earache, change in hearing, sore throat  Cardiovascular: No chest pain, palpitations, paroxysmal nocturnal dyspnea, claudication, edema  Respiratory: No cough, sputum production, hemoptysis, DOE, significant snoring, apnea Gastrointestinal: No heartburn, dysphagia, abdominal pain, nausea /vomiting, rectal bleeding, melena, change in bowels Genitourinary: No dysuria, hematuria, pyuria, incontinence, nocturia Musculoskeletal: No joint stiffness, joint swelling, weakness, pain Dermatologic: No rash, pruritus, change in appearance of skin Neurologic: No dizziness, headache, syncope, seizures, numbness, tingling Psychiatric: No  significant  anxiety, depression, insomnia, anorexia Endocrine: No change in hair/skin/nails, excessive thirst, excessive hunger, excessive urination  Hematologic/lymphatic: No significant bruising, lymphadenopathy, abnormal bleeding Allergy/immunology: No itchy/watery eyes, significant sneezing, urticaria, angioedema  Physical exam:  Pertinent or positive findings: Hair is disheveled.  Initially she exhibits a blank stare.  Ptosis is greater on the left than the right.  Slight esotropia of the left eye was present.  When I initially asked how she was doing; before responding "fine" she grimaced and started to cry.  Because of her blank stare I asked her to count fingers but she could not comply.  The left nasolabial fold was slightly decreased.  She has a harsh right carotid bruit.  Slight gallop cadence is suggested.  PEG is present as is Foley catheter.  Her hands are in soft braces and the lower extremities in supported booties.  Pedal pulses are decreased.  She could not move any of her extremities to command.There was intermittent coarse tremor of the LUE. Removal of dressings over heel & sacral decubiti deferred to Wound Care Nurse  General appearance: Adequately nourished; no acute distress, increased work of breathing is present.   Lymphatic: No lymphadenopathy about the head, neck, axilla. Eyes: No conjunctival inflammation or lid edema is present. There is no scleral icterus. Ears:  External ear exam shows no significant lesions or deformities.   Nose:  External nasal examination shows no deformity or inflammation. Nasal mucosa are pink and moist without lesions, exudates Oral exam: Lips and gums are healthy appearing.There is no oropharyngeal erythema or exudate. Neck:  No thyromegaly, masses, tenderness noted.    Heart:  Normal rate and regular rhythm. S1 and S2 normal without murmur, click, rub.  Lungs: Chest clear anteriorly to auscultation without wheezes, rhonchi, rales,  rubs. Abdomen: Bowel sounds are normal.  Abdomen is soft and nontender with no organomegaly, hernias, masses. GU: Deferred  Extremities:  No cyanosis, clubbing, edema. Neurologic exam:Balance, Rhomberg, finger to nose testing could not be completed due to clinical state Skin: Warm & dry w/o tenting. No significant visable rash.  See clinical summary under each active problem in the Problem List with associated updated therapeutic plan

## 2020-04-27 NOTE — Assessment & Plan Note (Addendum)
Speech Therapy and Nutrition consults at SNF. 

## 2020-04-27 NOTE — TOC Transition Note (Signed)
Transition of Care [facility]) - CM/SW Discharge Note   Patient Details  Name: Tasha [name] MRN: [id number] Date of Birth: 1946/03/04  [facility] CM/SW Contact:  Tivis Ringer, Student-Social Work Phone Number: 04/27/2020, 11:13 AM   Clinical Narrative:  Nurse to call report to  [phone] Rm. 303   Final next level of care: Skilled Nursing Facility Barriers to Discharge: Barriers Resolved   Patient Goals and CMS Choice Patient states their goals for this hospitalization and ongoing recovery are:: to go back to [email or web address] Compare Post Acute Care list provided to:: Patient Represenative (must comment) Choice offered to / list presented to : Adult Children  Discharge Placement              Patient chooses bed at: [facility] Patient to be transferred to facility by: [facility] Name of family member notified: Son, [name] Patient and family notified of of transfer: 04/27/20  Discharge Plan and Services     Post Acute Care Choice: Nursing Home, Resumption of Svcs/PTA Provider                               Social Determinants of Health (SDOH) Interventions     Readmission Risk Interventions No flowsheet data found.

## 2020-04-27 NOTE — Patient Instructions (Signed)
See assessment and plan under each diagnosis in the problem list and acutely for this visit 

## 2020-04-27 NOTE — Discharge Summary (Addendum)
Physician Discharge Summary  Towanda OctaveBetsy Carew ZOX:[id number] DOB: 11/01/1945 DOA: 03/08/2020  PCP: Patient, No Pcp Per  Admit date: 03/08/2020 Discharge date: 04/27/2020  Admitted From: HOME.  Disposition:  SNF.   Recommendations for Outpatient Follow-up:  1. Follow up with PCP in 1-2 weeks 2. Please obtain BMP/CBC in one week 3. Recommend outpatient followup with palliative care    Discharge Condition:stable.  CODE STATUS: full code.  Diet recommendation: tube feeds.    Brief/Interim Summary:  74 year old lady with prior history of type 2 diabetes, hypertension, MI, recent CVA in June 2021 presents in July fall 2021 with acute encephalopathy, intractable nausea and vomiting and was found to have AKI with lactic acidosis that improved with supportive care.  MRI of the brain subsequently on 03/14/2020 showed acute left pontine infarct and acute small infarct in the posterior right lateral ventricle.  Hospital course was complicated by severe dysphagia requiring initiation of tube feeds followed by PEG placement on 03/31/2020 as well as Zosyn for aspiration pneumonia.  Palliative care was consulted to assist in goals of care discussion due to poor patient's poor prognosis and meaningful recovery. Pt was febrile the night of 8/24, and CXR shows pneumonia. She was started on IV Unasyn for aspiration pneumonia. Blood cultures were done, negative so far.  Patient seen and examined at bedside opens eyes to verbal cues.  She is weaned off oxygen.  She appears to be at baseline.  Completed the course of Unasyn for aspiration pneumonia.  Discharge Diagnoses:  Active Problems:   CVA (cerebral vascular accident) (HCC)   DM (diabetes mellitus), type 2 with peripheral vascular complications (HCC)   Essential (primary) hypertension   Lactic acidosis   Nausea and vomiting   AKI (acute kidney injury) (HCC)   Hyperkalemia   Coffee ground emesis   Palliative care encounter   Pneumonia of both lungs due to  infectious organism   Advanced care planning/counseling discussion   Goals of care, counseling/discussion   Palliative care by specialist   Dysphagia   Pressure injury of skin  Acute combined toxic and metabolic encephalopathy Multifactorial probably combination of acute CVA, hyponatremia, hospital-acquired delirium. EEG is unrevealing. Vitamin B12 and TSH within normal limits. Patient appears to be alert, not in any kind of distress.    Hypernatremia Gentle hydration with dextrose infusion at 50 mL/h and free water boluses via PEG tube. Repeat BMP shows sodium of 138. Stop the dextrose infusion and continue with free water flushes via the PEG.    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 Patient currently on Plavix and Crestor as recommended by neurology for prophylaxis.    Severe bilateral aspiration pneumonia in the setting of severe dysphagia Patient completed 9-day course of IV Zosyn.  Palliative care consulted for goals of care discussion.  Patient currently has PEG tube and has tube feeds running. Recurrent aspiration pneumonia on chest x-ray 04/21/2020 she was empirically started on IV Unasyn for aspiration pneumonia.  Blood cultures were done and negative so far we were able to wean her off the oxygen today. RN reported that patient's PEG tube was clogged on April 23, 2020 IR consult was able to unclog the PEG tube and tube feeds are running back again.  No more issues with PEG tube Completed the course of Unasyn for aspiration pneumonia. Sepsis ruled out.   Essential hypertension BP parameters very well controlled, continue with  metoprolol.  AKI Resolved with IV  fluids.    Type 2 diabetes mellitus Hemoglobin A1c is 6.9 CBG (last 3)  Recent Labs (last 2 labs)        Recent Labs    04/26/20 0358 04/26/20 0751 04/26/20 1233  GLUCAP 152* 111* 154*      Continue sliding scale insulin.  No changes in medications    Depression Continue with low-dose Remeron.  Left breast mass seen on CT of the chest on 04/08/2020. Pending goals of care discussion.  Patient will need diagnostic mammogram as an outpatient.    Acute urinary retention Foley catheter placed on 04/05/2020.    Pressure injury:   Pressure Injury 04/25/20 Heel Right;Left Stage 1 -  Intact skin with non-blanchable redness of a localized area usually over a bony prominence. (Active)  04/25/20 1136  Location: Heel  Location Orientation: Right;Left  Staging: Stage 1 -  Intact skin with non-blanchable redness of a localized area usually over a bony prominence.  Wound Description (Comments):   Present on Admission:      Pressure Injury 04/25/20 Sacrum Right;Left;Medial Unstageable - Full thickness tissue loss in which the base of the injury is covered by slough (yellow, tan, gray, green or brown) and/or eschar (tan, brown or black) in the wound bed. (Active)  04/25/20 1137  Location: Sacrum  Location Orientation: Right;Left;Medial  Staging: Unstageable - Full thickness tissue loss in which the base of the injury is covered by slough (yellow, tan, gray, green or brown) and/or eschar (tan, brown or black) in the wound bed.  Wound Description (Comments):   Present on Admission:    Wound care consulted patient has 3 cm x 2 cm coccygeal deep tissue pressure injury.  And left lateral heel serum filled blisters, stage II and stage II right heel serum filled blister.  Patient is currently on a mattress replacement with low air loss feature being turned and repositioned from side to side.  She is wearing bilateral pressure distribution heel boots.     Poor progression, multiple comorbidities, clinical deterioration, recurrent aspiration pneumonias, dysphagia, palliative care consulted for goals of care discussion.   Recommend outpatient follow u with palliative  care.         Discharge Instructions  Discharge Instructions    Diet - low sodium heart healthy   Complete by: As directed    Discharge wound care:   Complete by: As directed    Comments: Wound care to deep tissue pressure injury (DTPI) to coccyx and to bilateral Heel Stage 2 (Intact, serum-filled blisters) pressure injuries:  Cleanse with soap and water, rinse and pat dry. Cover affected areas with single layer of xeroform gauze [place] # 294), top with dry gauze and cover with silicone foam dressings. Place feet into Prevalon Boots per protocol. Turn patient side to side and minimize time in the supine position.     Allergies as of 04/27/2020      Reactions   Allopurinol Shortness Of Breath   Tetracycline Anaphylaxis   Amoxicillin-pot Clavulanate Diarrhea   Atorvastatin Other (See Comments)   Unknown reaction - reported by [facility], [place] clinic 05/10/2012   Azithromycin Other (See Comments)   Caused blisters inside and out   Codeine Other (See Comments)   Altered mental status   Lisinopril Other (See Comments)   Unknown reaction - reported by [facility], [place] clinic 12/23/2008 02/20/2020 she believes "reaction" was NP cough. No PMH of angioedema   Sulfa Antibiotics Diarrhea      Medication List    STOP taking  these medications   aspirin 81 MG chewable tablet   hydrochlorothiazide 25 MG tablet Commonly known as: HYDRODIURIL   loratadine 10 MG tablet Commonly known as: CLARITIN   magnesium hydroxide 400 MG/5ML suspension Commonly known as: MILK OF MAGNESIA   metFORMIN 1000 MG tablet Commonly known as: Glucophage   metoprolol tartrate 25 MG tablet Commonly known as: LOPRESSOR Replaced by: metoprolol tartrate 25 mg/10 mL Susp   Nuedexta 20-10 MG capsule Generic drug: Dextromethorphan-quiNIDine   ondansetron 4 MG tablet Commonly known as: ZOFRAN   PRESCRIPTION MEDICATION   RA SALINE ENEMA RE   senna-docusate 8.6-50 MG tablet Commonly known as: Senokot-S      TAKE these medications   amLODipine 10 MG tablet Commonly known as: NORVASC Take 1 tablet (10 mg total) by mouth daily.   bisacodyl 10 MG suppository Commonly known as: DULCOLAX Place 10 mg rectally as needed for moderate constipation. If MOM doesn't work   clopidogrel 75 MG tablet Commonly known as: PLAVIX Place 1 tablet (75 mg total) into feeding tube daily. What changed: how to take this   feeding supplement (GLUCERNA 1.2 CAL) Liqd Place 1,000 mLs into feeding tube continuous.   feeding supplement (GLUCERNA 1.5 CAL) Liqd Place 960 mLs into feeding tube daily.   feeding supplement (PROSource TF) liquid Place 45 mLs into feeding tube daily.   free water Soln Place 200 mLs into feeding tube every 4 (four) hours.   insulin aspart 100 UNIT/ML injection Commonly known as: novoLOG CBG 70 - 120: 0 units CBG 121 - 150: 1 unit CBG 151 - 200: 2 units CBG 201 - 250: 3 units CBG 251 - 300: 5 units CBG 301 - 350: 7 units CBG 351 - 400: 9 units CBG > 400: call MD and obtain   insulin detemir 100 UNIT/ML injection Commonly known as: LEVEMIR Inject 0.1 mLs (10 Units total) into the skin 2 (two) times daily.   metoprolol tartrate 25 mg/10 mL Susp Commonly known as: LOPRESSOR Place 10 mLs (25 mg total) into feeding tube 2 (two) times daily. Replaces: metoprolol tartrate 25 MG tablet   pantoprazole sodium 40 mg/20 mL Pack Commonly known as: PROTONIX Place 20 mLs (40 mg total) into feeding tube daily.   polyethylene glycol 17 g packet Commonly known as: MIRALAX / GLYCOLAX Place 17 g into feeding tube daily as needed for mild constipation.   Resource ThickenUp Clear Powd As needed.   rosuvastatin 5 MG tablet Commonly known as: CRESTOR Place 1 tablet (5 mg total) into feeding tube daily. What changed:   medication strength  how much to take  how to take this   tetrahydrozoline 0.05 % ophthalmic solution Place 1 drop into both eyes as needed (allergies).             Discharge Care Instructions  (From admission, onward)         Start     Ordered   04/27/20 0000  Discharge wound care:       Comments: Comments: Wound care to deep tissue pressure injury (DTPI) to coccyx and to bilateral Heel Stage 2 (Intact, serum-filled blisters) pressure injuries:  Cleanse with soap and water, rinse and pat dry. Cover affected areas with single layer of xeroform gauze [place] # 294), top with dry gauze and cover with silicone foam dressings. Place feet into Prevalon Boots per protocol. Turn patient side to side and minimize time in the supine position.   04/27/20 1010  Contact information for follow-up providers    Estanislado Emms, MD Follow up.   Specialty: Internal Medicine Why: Hospital follow-up for kidney failure Contact information: [address] [place] [address] [phone]            Contact information for after-discharge care    Destination    [facility] Preferred SNF .   Service: Skilled Nursing Contact information: [address] [place] [address] [phone]                 Allergies  Allergen Reactions  . Allopurinol Shortness Of Breath  . Tetracycline Anaphylaxis  . Amoxicillin-Pot Clavulanate Diarrhea  . Atorvastatin Other (See Comments)    Unknown reaction - reported by [facility], [place] clinic 05/10/2012  . Azithromycin Other (See Comments)    Caused blisters inside and out  . Codeine Other (See Comments)    Altered mental status  . Lisinopril Other (See Comments)    Unknown reaction - reported by [facility], [place] clinic 12/23/2008 02/20/2020 she believes "reaction" was NP cough. No PMH of angioedema  . Sulfa Antibiotics Diarrhea    Consultations:  Palliative care    Procedures/Studies: CT ABDOMEN PELVIS WO CONTRAST  Result Date: 04/08/2020 CLINICAL DATA:  Fever, leukocytosis. Pneumonia, effusion or abscess suspected. EXAM: CT CHEST, ABDOMEN AND PELVIS WITHOUT  CONTRAST TECHNIQUE: Multidetector CT imaging of the chest, abdomen and pelvis was performed following the standard protocol without IV contrast. COMPARISON:  CT abdomen dated 03/08/2020. FINDINGS: CT CHEST FINDINGS Cardiovascular: No thoracic aortic aneurysm. No pericardial effusion. Thoracic aortic atherosclerosis. Coronary artery calcifications, particularly dense within the LEFT anterior descending coronary artery. Mediastinum/Nodes: No mass or enlarged lymph nodes are seen within the mediastinum. Esophagus is unremarkable. Trachea appears normal. Lungs/Pleura: Dense consolidations at the bilateral lung bases, RIGHT greater than LEFT. Additional patchy ground-glass opacities within the peripheral portions of each lung, most prominent within the upper lobes and LEFT lower lobe. No pleural effusion is seen. No pneumothorax. Musculoskeletal: No acute or suspicious osseous finding. Masslike density within the outer LEFT breast, of uncertain significance. CT ABDOMEN PELVIS FINDINGS Hepatobiliary: No focal liver abnormality. Gallbladder is unremarkable. No bile duct dilatation. Pancreas: Unremarkable. No pancreatic ductal dilatation or surrounding inflammatory changes. Spleen: Normal in size without focal abnormality. Adrenals/Urinary Tract: Adrenal glands appear normal. Kidneys are unremarkable without mass, stone or hydronephrosis. No perinephric fluid. Bladder is decompressed by Foley catheter. Stomach/Bowel: No dilated large or small bowel loops. Fluid, and associated air-fluid levels scattered throughout the large and small bowel, suggesting ileus. Diverticulosis of the sigmoid and descending colon but no focal inflammatory change to suggest acute diverticulitis. Gastrostomy tube appears appropriately positioned. Vascular/Lymphatic: Aortic atherosclerosis. No enlarged lymph nodes seen. Reproductive: No adnexal mass or free fluid Other: No free fluid or abscess collection is seen within the abdomen or pelvis. No  free intraperitoneal air. Musculoskeletal: No acute or suspicious osseous finding. Surgical changes of kyphoplasty/vertebroplasty at the L2 vertebral body level. IMPRESSION: 1. Dense consolidations at the bilateral lung bases, RIGHT greater than LEFT, consistent with multifocal pneumonia and/or aspiration pneumonitis. 2. Additional patchy ground-glass opacities within the peripheral portions of each lung, most prominent within the upper lobes and LEFT lower lobe. Differential includes atypical pneumonias such as viral or fungal, interstitial pneumonias, edema related to volume overload/CHF, chronic interstitial diseases, hypersensitivity pneumonitis, and respiratory bronchiolitis. COVID-19 pneumonia can have this appearance. 3. Masslike density within the outer LEFT breast, of uncertain significance, neoplastic mass not excluded. Recommend correlation with diagnostic  mammogram at a dedicated breast center after current issues are resolved. 4. Colonic diverticulosis without evidence of acute diverticulitis. 5. Fluid, and associated air-fluid levels, scattered throughout the large and small bowel, suggesting ileus. No evidence of bowel obstruction. 6. No free fluid or abscess collection is seen within the abdomen or pelvis. No free intraperitoneal air. Aortic Atherosclerosis (ICD10-I70.0). Electronically Signed   By: Bary Richard M.D.   On: 04/08/2020 13:35   DG Abd 1 View  Result Date: 04/04/2020 CLINICAL DATA:  Abdominal distension, vomiting, fever EXAM: ABDOMEN - 1 VIEW COMPARISON:  03/19/2020 abdominal radiograph FINDINGS: Percutaneous gastrostomy tube terminates over the body of the stomach. Vertebroplasty changes again noted at L2. no disproportionately dilated small bowel loops. Moderate colorectal stool volume with dilation of the rectum to 8.9 cm diameter. No evidence of pneumatosis or pneumoperitoneum. Clear lung bases. IMPRESSION: Nonobstructive bowel gas pattern. Moderate colorectal stool volume, with  dilation of the rectum to 8.9 cm, cannot exclude rectal fecal impaction. Electronically Signed   By: Delbert Phenix M.D.   On: 04/04/2020 12:56   CT CHEST WO CONTRAST  Result Date: 04/08/2020 CLINICAL DATA:  Fever, leukocytosis. Pneumonia, effusion or abscess suspected. EXAM: CT CHEST, ABDOMEN AND PELVIS WITHOUT CONTRAST TECHNIQUE: Multidetector CT imaging of the chest, abdomen and pelvis was performed following the standard protocol without IV contrast. COMPARISON:  CT abdomen dated 03/08/2020. FINDINGS: CT CHEST FINDINGS Cardiovascular: No thoracic aortic aneurysm. No pericardial effusion. Thoracic aortic atherosclerosis. Coronary artery calcifications, particularly dense within the LEFT anterior descending coronary artery. Mediastinum/Nodes: No mass or enlarged lymph nodes are seen within the mediastinum. Esophagus is unremarkable. Trachea appears normal. Lungs/Pleura: Dense consolidations at the bilateral lung bases, RIGHT greater than LEFT. Additional patchy ground-glass opacities within the peripheral portions of each lung, most prominent within the upper lobes and LEFT lower lobe. No pleural effusion is seen. No pneumothorax. Musculoskeletal: No acute or suspicious osseous finding. Masslike density within the outer LEFT breast, of uncertain significance. CT ABDOMEN PELVIS FINDINGS Hepatobiliary: No focal liver abnormality. Gallbladder is unremarkable. No bile duct dilatation. Pancreas: Unremarkable. No pancreatic ductal dilatation or surrounding inflammatory changes. Spleen: Normal in size without focal abnormality. Adrenals/Urinary Tract: Adrenal glands appear normal. Kidneys are unremarkable without mass, stone or hydronephrosis. No perinephric fluid. Bladder is decompressed by Foley catheter. Stomach/Bowel: No dilated large or small bowel loops. Fluid, and associated air-fluid levels scattered throughout the large and small bowel, suggesting ileus. Diverticulosis of the sigmoid and descending colon but  no focal inflammatory change to suggest acute diverticulitis. Gastrostomy tube appears appropriately positioned. Vascular/Lymphatic: Aortic atherosclerosis. No enlarged lymph nodes seen. Reproductive: No adnexal mass or free fluid Other: No free fluid or abscess collection is seen within the abdomen or pelvis. No free intraperitoneal air. Musculoskeletal: No acute or suspicious osseous finding. Surgical changes of kyphoplasty/vertebroplasty at the L2 vertebral body level. IMPRESSION: 1. Dense consolidations at the bilateral lung bases, RIGHT greater than LEFT, consistent with multifocal pneumonia and/or aspiration pneumonitis. 2. Additional patchy ground-glass opacities within the peripheral portions of each lung, most prominent within the upper lobes and LEFT lower lobe. Differential includes atypical pneumonias such as viral or fungal, interstitial pneumonias, edema related to volume overload/CHF, chronic interstitial diseases, hypersensitivity pneumonitis, and respiratory bronchiolitis. COVID-19 pneumonia can have this appearance. 3. Masslike density within the outer LEFT breast, of uncertain significance, neoplastic mass not excluded. Recommend correlation with diagnostic mammogram at a dedicated breast center after current issues are resolved. 4. Colonic diverticulosis without evidence of acute diverticulitis. 5. Fluid,  and associated air-fluid levels, scattered throughout the large and small bowel, suggesting ileus. No evidence of bowel obstruction. 6. No free fluid or abscess collection is seen within the abdomen or pelvis. No free intraperitoneal air. Aortic Atherosclerosis (ICD10-I70.0). Electronically Signed   By: Bary Richard M.D.   On: 04/08/2020 13:35   IR GASTROSTOMY TUBE MOD SED  Result Date: 03/31/2020 INDICATION: 74 year old female with a history of dysphagia EXAM: PERC PLACEMENT GASTROSTOMY MEDICATIONS: Vancomycin 1 gm IV; Antibiotics were administered within 1 hour of the procedure.  ANESTHESIA/SEDATION: Versed 0.5 mg IV; Fentanyl 25 mcg IV Moderate Sedation Time:  10 minutes The patient was continuously monitored during the procedure by the interventional radiology nurse under my direct supervision. CONTRAST:  56mL OMNIPAQUE IOHEXOL 300 MG/ML SOLN - administered into the gastric lumen. FLUOROSCOPY TIME:  Fluoroscopy Time: 2 minutes 54 seconds (6 mGy). COMPLICATIONS: None PROCEDURE: Informed written consent was obtained from the patient and the patient's family after a thorough discussion of the procedural risks, benefits and alternatives. All questions were addressed. Maximal Sterile Barrier Technique was utilized including caps, mask, sterile gowns, sterile gloves, sterile drape, hand hygiene and skin antiseptic. A timeout was performed prior to the initiation of the procedure. The epigastrium was prepped with Betadine in a sterile fashion, and a sterile drape was applied covering the operative field. A sterile gown and sterile gloves were used for the procedure. A 5-French orogastric tube is placed under fluoroscopic guidance. Scout imaging of the abdomen confirms barium within the transverse colon. The stomach was distended with gas. Under fluoroscopic guidance, an 18 gauge needle was utilized to puncture the anterior wall of the body of the stomach. An Amplatz wire was advanced through the needle passing a T fastener into the lumen of the stomach. The T fastener was secured for gastropexy. A 9-French sheath was inserted. A snare was advanced through the 9-French sheath. A Teena Dunk was advanced through the orogastric tube. It was snared then pulled out the oral cavity, pulling the snare, as well. The leading edge of the gastrostomy was attached to the snare. It was then pulled down the esophagus and out the percutaneous site. Tube secured in place. Contrast was injected. Patient tolerated the procedure well and remained hemodynamically stable throughout. No complications were encountered and  no significant blood loss encountered. IMPRESSION: Status post fluoroscopic placed percutaneous gastrostomy tube, with 20 [place] pull-through. Signed, Yvone Neu. Loreta Ave, DO [facility] Electronically Signed   By: Gilmer Mor D.O.   On: 03/31/2020 12:59   DG CHEST PORT 1 VIEW  Result Date: 04/21/2020 CLINICAL DATA:  Fever and hypertension EXAM: PORTABLE CHEST 1 VIEW COMPARISON:  April 04, 2020 FINDINGS: There is ill-defined airspace opacity in the right perihilar region. There is a small granuloma in the right apex. Lungs elsewhere clear. Heart size and pulmonary vascularity are normal. No adenopathy. No bone lesions. IMPRESSION: Ill-defined opacity concerning for pneumonia in the right perihilar region. Small granuloma right apex. Lungs elsewhere clear. Cardiac silhouette within normal limits. No adenopathy. Electronically Signed   By: Bretta Bang III M.D.   On: 04/21/2020 15:36   DG CHEST PORT 1 VIEW  Result Date: 04/04/2020 CLINICAL DATA:  Fever and vomiting. EXAM: PORTABLE CHEST 1 VIEW COMPARISON:  03/28/2020 FINDINGS: Feeding tube has been removed. Stable elevation of the RIGHT hemidiaphragm. There is minimal subsegmental atelectasis at the LEFT lung base. No consolidations or pleural effusions. No pulmonary edema. IMPRESSION: Minimal LEFT lower lobe atelectasis. Electronically Signed  By: Norva Pavlov M.D.   On: 04/04/2020 12:34   DG CHEST PORT 1 VIEW  Result Date: 03/28/2020 CLINICAL DATA:  74 year old with fever and vomiting. EXAM: PORTABLE CHEST 1 VIEW COMPARISON:  03/09/2020 and earlier. FINDINGS: Cardiac silhouette normal in size, unchanged. Thoracic aorta tortuous and atherosclerotic, unchanged. Chronic elevation of the RIGHT hemidiaphragm with associated scar/atelectasis at the RIGHT lung base. Calcified RIGHT hilar lymph nodes again noted as is the calcified granuloma in the RIGHT lung apex. Lungs otherwise clear. No  localized airspace consolidation. No pleural effusions. No pneumothorax. Normal pulmonary vascularity. IMPRESSION: 1. No acute cardiopulmonary disease. 2. Chronic elevation of the RIGHT hemidiaphragm with associated scar/atelectasis at the RIGHT lung base. 3. Old granulomatous disease. Electronically Signed   By: Hulan Saas M.D.   On: 03/28/2020 15:20       Subjective: No new complaints.   Discharge Exam: Vitals:   04/27/20 0346 04/27/20 0714  BP: 137/64 (!) 148/71  Pulse: 73 83  Resp: 17 18  Temp: 99 F (37.2 C) 99.2 F (37.3 C)  SpO2: 100% 96%   Vitals:   04/26/20 1939 04/26/20 2347 04/27/20 0346 04/27/20 0714  BP: 140/86 134/64 137/64 (!) 148/71  Pulse: 80 71 73 83  Resp: 16 16 17 18   Temp: 98.9 F (37.2 C) 97.7 F (36.5 C) 99 F (37.2 C) 99.2 F (37.3 C)  TempSrc: Oral Oral Oral Axillary  SpO2: 96% 97% 100% 96%  Weight:   60.6 kg   Height:        General: Pt is alert, awake, not in acute distress Cardiovascular: RRR, S1/S2 +, no rubs, no gallops Respiratory: CTA bilaterally, no wheezing, no rhonchi Abdominal: Soft, NT, ND, bowel sounds + Extremities: no edema, no cyanosis    The results of significant diagnostics from this hospitalization (including imaging, microbiology, ancillary and laboratory) are listed below for reference.     Microbiology: Recent Results (from the past 240 hour(s))  Culture, blood (Routine X 2) w Reflex to ID Panel     Status: None   Collection Time: 04/21/20  4:09 PM   Specimen: BLOOD  Result Value Ref Range Status   Specimen Description BLOOD LEFT ANTECUBITAL  Final   Special Requests   Final    BOTTLES DRAWN AEROBIC AND ANAEROBIC Blood Culture adequate volume   Culture   Final    NO GROWTH 5 DAYS Performed at [facility], [address] [place], [place] [address]    Report Status 04/26/2020 FINAL  Final  Culture, blood (Routine X 2) w Reflex to ID Panel     Status: None   Collection Time: 04/21/20  4:12 PM    Specimen: BLOOD RIGHT ARM  Result Value Ref Range Status   Specimen Description BLOOD RIGHT ARM  Final   Special Requests   Final    BOTTLES DRAWN AEROBIC AND ANAEROBIC Blood Culture adequate volume   Culture   Final    NO GROWTH 5 DAYS Performed at [facility], [address] [place], [place] [address]    Report Status 04/26/2020 FINAL  Final  SARS Coronavirus 2 by RT PCR (hospital order, performed in [facility] hospital lab) Nasopharyngeal Nasopharyngeal Swab     Status: None   Collection Time: 04/26/20 11:52 AM   Specimen: Nasopharyngeal Swab  Result Value Ref Range Status   SARS Coronavirus 2 NEGATIVE NEGATIVE Final    Comment: (NOTE) SARS-CoV-2 target nucleic acids are NOT DETECTED.  The SARS-CoV-2 RNA is  generally detectable in upper and lower respiratory specimens during the acute phase of infection. The lowest concentration of SARS-CoV-2 viral copies this assay can detect is 250 copies / mL. A negative result does not preclude SARS-CoV-2 infection and should not be used as the sole basis for treatment or other patient management decisions.  A negative result may occur with improper specimen collection / handling, submission of specimen other than nasopharyngeal swab, presence of viral mutation(s) within the areas targeted by this assay, and inadequate number of viral copies (<250 copies / mL). A negative result must be combined with clinical observations, patient history, and epidemiological information.  Fact Sheet for Patients:   BoilerBrush.com.cy  Fact Sheet for Healthcare Providers: https://pope.com/  This test is not yet approved or  cleared by the [place] FDA and has been authorized for detection and/or diagnosis of SARS-CoV-2 by FDA under an Emergency Use Authorization (EUA).  This EUA will remain in effect (meaning this test can be used) for the duration of the COVID-19 declaration under Section  564(b)(1) of the Act, 21 U.S.C. section 360bbb-3(b)(1), unless the authorization is terminated or revoked sooner.  Performed at [facility], [address], [place], [place] [address]      Labs: BNP (last 3 results) No results for input(s): BNP in the last 8760 hours. Basic Metabolic Panel: Recent Labs  Lab 04/21/20 1113 04/22/20 0907 04/23/20 0314 04/26/20 0101  NA 150* 142 141 138  K 4.2 4.3 3.9 3.8  CL 113* 107 107 107  CO2 21*  GLUCOSE 232* 176* 134* 181*  BUN 50* 44* 36* 22  CREATININE 0.80 0.64 0.59 0.56  CALCIUM 9.1 8.9 8.8* 8.7*   Liver Function Tests: No results for input(s): AST, ALT, ALKPHOS, BILITOT, PROT, ALBUMIN in the last 168 hours. No results for input(s): LIPASE, AMYLASE in the last 168 hours. No results for input(s): AMMONIA in the last 168 hours. CBC: Recent Labs  Lab 04/23/20 0314 04/26/20 0101  WBC 9.1 9.6  NEUTROABS 4.9  --   HGB 7.4* 8.3*  HCT 24.7* 26.1*  MCV 98.4 97.0  PLT 312 325   Cardiac Enzymes: No results for input(s): CKTOTAL, CKMB, CKMBINDEX, TROPONINI in the last 168 hours. BNP: Invalid input(s): POCBNP CBG: Recent Labs  Lab 04/26/20 1635 04/26/20 2005 04/26/20 2348 04/27/20 0347 04/27/20 0755  GLUCAP 148* 141* 142* 150* 181*   D-Dimer No results for input(s): DDIMER in the last 72 hours. Hgb A1c No results for input(s): HGBA1C in the last 72 hours. Lipid Profile No results for input(s): CHOL, HDL, LDLCALC, TRIG, CHOLHDL, LDLDIRECT in the last 72 hours. Thyroid function studies No results for input(s): TSH, T4TOTAL, T3FREE, THYROIDAB in the last 72 hours.  Invalid input(s): FREET3 Anemia work up No results for input(s): VITAMINB12, FOLATE, FERRITIN, TIBC, IRON, RETICCTPCT in the last 72 hours. Urinalysis    Component Value Date/Time   COLORURINE AMBER (A) 04/04/2020 1055   APPEARANCEUR CLOUDY (A) 04/04/2020 1055   LABSPEC 1.009 04/04/2020 1055   PHURINE 9.0 (H) 04/04/2020 1055   GLUCOSEU  NEGATIVE 04/04/2020 1055   HGBUR SMALL (A) 04/04/2020 1055   BILIRUBINUR NEGATIVE 04/04/2020 1055   KETONESUR NEGATIVE 04/04/2020 1055   PROTEINUR 100 (A) 04/04/2020 1055   NITRITE NEGATIVE 04/04/2020 1055   LEUKOCYTESUR MODERATE (A) 04/04/2020 1055   Sepsis Labs Invalid input(s): PROCALCITONIN,  WBC,  LACTICIDVEN Microbiology Recent Results (from the past 240 hour(s))  Culture, blood (Routine X 2) w Reflex to ID  Panel     Status: None   Collection Time: 04/21/20  4:09 PM   Specimen: BLOOD  Result Value Ref Range Status   Specimen Description BLOOD LEFT ANTECUBITAL  Final   Special Requests   Final    BOTTLES DRAWN AEROBIC AND ANAEROBIC Blood Culture adequate volume   Culture   Final    NO GROWTH 5 DAYS Performed at [facility], [address], [place], [place] [address]    Report Status 04/26/2020 FINAL  Final  Culture, blood (Routine X 2) w Reflex to ID Panel     Status: None   Collection Time: 04/21/20  4:12 PM   Specimen: BLOOD RIGHT ARM  Result Value Ref Range Status   Specimen Description BLOOD RIGHT ARM  Final   Special Requests   Final    BOTTLES DRAWN AEROBIC AND ANAEROBIC Blood Culture adequate volume   Culture   Final    NO GROWTH 5 DAYS Performed at [facility], [address], [place], [place] [address]    Report Status 04/26/2020 FINAL  Final  SARS Coronavirus 2 by RT PCR (hospital order, performed in [facility] hospital lab) Nasopharyngeal Nasopharyngeal Swab     Status: None   Collection Time: 04/26/20 11:52 AM   Specimen: Nasopharyngeal Swab  Result Value Ref Range Status   SARS Coronavirus 2 NEGATIVE NEGATIVE Final    Comment: (NOTE) SARS-CoV-2 target nucleic acids are NOT DETECTED.  The SARS-CoV-2 RNA is generally detectable in upper and lower respiratory specimens during the acute phase of infection. The lowest concentration of SARS-CoV-2 viral copies this assay can detect is 250 copies / mL. A negative result does not preclude  SARS-CoV-2 infection and should not be used as the sole basis for treatment or other patient management decisions.  A negative result may occur with improper specimen collection / handling, submission of specimen other than nasopharyngeal swab, presence of viral mutation(s) within the areas targeted by this assay, and inadequate number of viral copies (<250 copies / mL). A negative result must be combined with clinical observations, patient history, and epidemiological information.  Fact Sheet for Patients:   BoilerBrush.com.cy  Fact Sheet for Healthcare Providers: https://pope.com/  This test is not yet approved or  cleared by the [place] FDA and has been authorized for detection and/or diagnosis of SARS-CoV-2 by FDA under an Emergency Use Authorization (EUA).  This EUA will remain in effect (meaning this test can be used) for the duration of the COVID-19 declaration under Section 564(b)(1) of the Act, 21 U.S.C. section 360bbb-3(b)(1), unless the authorization is terminated or revoked sooner.  Performed at [facility], [address] [place], [place] [address]      Time coordinating discharge: 32 minutes.  SIGNED:   Kathlen Mody, MD  [facility] 04/27/2020, 10:12 AM

## 2020-04-27 NOTE — Assessment & Plan Note (Signed)
04/26/2020 AKI resolved; creatinine 0.56, GFR greater than 60.

## 2020-04-27 NOTE — Assessment & Plan Note (Addendum)
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  MMSE will be performed to assess competency.  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 Any HCPOA must be identified ; patient incapable of independent living & prognosis dire based on last 2 admissions . Present code status unrealistic.

## 2020-04-27 NOTE — Assessment & Plan Note (Signed)
Most current A1c is 6.9% indicating adequate control in the context of her multiple advanced comorbidities.  Sliding scale insulin is not recommended in the SNF facility as per medical literature.

## 2020-04-27 NOTE — Progress Notes (Addendum)
Nutrition Follow-up  DOCUMENTATION CODES:   Not applicable  INTERVENTION:   ContinueTFvia PEG: -Glucerna 1.5cal@40ml /hr (967ml) -67ml Prosource TF daily -220ml free water Q4H (or per MD)  Tube feeding will provide 1480kcals,90grams protein,1914ml free water    NUTRITION DIAGNOSIS:   Inadequate oral intake related to lethargy/confusion as evidenced by other (comment) (per RN report). -- Ongoing  GOAL:   Patient will meet greater than or equal to 90% of their needs -- Met with TF   MONITOR:   PO intake, Diet advancement, TF tolerance, Labs, Weight trends  REASON FOR ASSESSMENT:   Consult Enteral/tube feeding initiation and management  ASSESSMENT:   Pt with a history of stroke in June, HTN, MI and DM presented 7/12 with N/V and AMS. Pt admitted w/ uremia and possible infection. In hospital, R sided weakness not improving, MRI showed new acute infarcts.   7/15 NGT placed (gastric), TF initiated 7/16 NGT advanced to LOT 7/17 diet advanced to full liquids, TF reduced by MD 7/23 NGT replaced with Cortrak (gastric) 7/26 pt made NPO by SLP due to dysphagia 8/4 s/p PEG  8/8 TFs held due to pt vomiting; abd xray revealed possible fecal impaction; CXR showed possible aspiration PNA 8/9 pt had BM; TF resumed 8/12 CT revealed dense bilateral multifocal aspiration pneumonia 8/14 TF rate reduced by MD to 45ml/hr 8/25 CXR revealed aspiration PNA  Per MD, pt appears to be at baseline.   Current TF: Glucerna 1.5 cal @ 38ml/hr, 67ml Prosource TF daily, 273ml free water Q4H  Labs: CBGs 141-181 Medications: Novolog, Levemir, Protonix   Diet Order:   Diet Order            Diet NPO time specified  Diet effective midnight                 EDUCATION NEEDS:   No education needs have been identified at this time  Skin:  Skin Assessment: Skin Integrity Issues: Skin Integrity Issues:: Stage I, Unstageable Stage I: R/L heel Unstageable: R/L sacrum Other: MASD  perineum  Last BM:  8/28 type 7  Height:   Ht Readings from Last 1 Encounters:  03/08/20 5\' 4"  (1.626 m)    Weight:   Wt Readings from Last 1 Encounters:  04/27/20 60.6 kg   BMI:  Body mass index is 22.93 kg/m.  Estimated Nutritional Needs:   Kcal:  1500-1700 kcal  Protein:  75-85 grams  Fluid:  >/= 1.8 L/day    Larkin Ina, MS, RD, LDN RD pager number and weekend/on-call pager number located in [place].

## 2020-04-27 NOTE — Assessment & Plan Note (Addendum)
Bilateral heel stage I heel ulcers as well as the unstageable coccygeal ulcer will be followed by the Wound Care Nurse at the SNF.

## 2020-05-07 ENCOUNTER — Non-Acute Institutional Stay (SKILLED_NURSING_FACILITY): Payer: Self-pay | Admitting: Adult Health

## 2020-05-07 ENCOUNTER — Encounter: Payer: Self-pay | Admitting: Adult Health

## 2020-05-07 DIAGNOSIS — E1151 Type 2 diabetes mellitus with diabetic peripheral angiopathy without gangrene: Secondary | ICD-10-CM

## 2020-05-07 DIAGNOSIS — I6930 Unspecified sequelae of cerebral infarction: Secondary | ICD-10-CM

## 2020-05-07 DIAGNOSIS — R1310 Dysphagia, unspecified: Secondary | ICD-10-CM

## 2020-05-07 DIAGNOSIS — I10 Essential (primary) hypertension: Secondary | ICD-10-CM

## 2020-05-07 NOTE — Progress Notes (Signed)
Location:  [facility] Room Number: 303-A Place of Service:  SNF (31) Provider:  Kenard Gower, DNP, FNP-BC  Patient Care Team: Patient, No Pcp Per as PCP - General (General Practice)  Extended Emergency Contact Information Primary Emergency Contact: lean, jaeger Mobile Phone: [phone] Relation: Son Secondary Emergency Contact: Issabella, Rix Mobile Phone: [phone] Relation: Daughter  Code Status:  FULL CODE  Goals of care: Advanced Directive information Advanced Directives 03/08/2020  Does Patient Have a Medical Advance Directive? No  Would patient like information on creating a medical advance directive? No - Patient declined     Chief Complaint  Patient presents with  . Medical Management of Chronic Issues    Routine short-term rehabilitation visit    HPI:  Pt is a 74 y.o. female seen today for medical management of chronic diseases.  She is a short-term care resident of [facility]. She has a PMH of MI, dyslipidemia and essential hypertension. She was seen in her room today. She was aphasic and did not move any of her extremities. CBGs ranging from 102 to 167.  She takes Levemir 10 units twice a day for diabetes mellitus.  SBPs ranging from 112-137, with an outlier 100.  She takes amlodipine 10 mg daily for hypertension.  She was admitted to [facility] on 04/27/20 post hospitalization 03/08/20 to 04/27/20 due to acute CVA. She had severe dysphagia requiring peg tube placement on 03/31/20. She was treated with Zosyn and Unasyn for aspiration pneumonia.  Palliative care was consulted for goals of care due to poor prognosis.     Past Medical History:  Diagnosis Date  . Diabetes mellitus without complication (HCC)   . Essential (primary) hypertension   . History of falling   . MI (myocardial infarction) (HCC)   . Stroke [facility])    Past Surgical History:  Procedure Laterality Date  .  ABDOMINAL HYSTERECTOMY    . APPENDECTOMY    . BACK SURGERY    . IR GASTROSTOMY TUBE MOD SED  03/31/2020    Allergies  Allergen Reactions  . Allopurinol Shortness Of Breath  . Tetracycline Anaphylaxis  . Amoxicillin-Pot Clavulanate Diarrhea  . Atorvastatin Other (See Comments)    Unknown reaction - reported by [facility] [place] clinic 05/10/2012  . Azithromycin Other (See Comments)    Caused blisters inside and out  . Codeine Other (See Comments)    Altered mental status  . Lisinopril Other (See Comments)    Unknown reaction - reported by [facility] [place] clinic 12/23/2008 02/20/2020 she believes "reaction" was NP cough. No PMH of angioedema  . Sulfa Antibiotics Diarrhea    Outpatient Encounter Medications as of 05/07/2020  Medication Sig  . amLODipine (NORVASC) 10 MG tablet Place 10 mg into feeding tube daily.  . bisacodyl (DULCOLAX) 10 MG suppository Place 10 mg rectally as needed for moderate constipation. If MOM doesn't work  . clopidogrel (PLAVIX) 75 MG tablet Place 1 tablet (75 mg total) into feeding tube daily.  . insulin detemir (LEVEMIR) 100 UNIT/ML injection Inject 0.1 mLs (10 Units total) into the skin 2 (two) times daily.  . metoprolol tartrate (LOPRESSOR) 25 MG tablet Place 25 mg into feeding tube 2 (two) times daily.  . Nutritional Supplements (FEEDING SUPPLEMENT, PROSOURCE TF,) liquid Place 45 mLs into feeding tube daily.  . pantoprazole sodium (PROTONIX) 40 mg/20 mL PACK Place 20 mLs (40 mg total) into feeding tube daily.  . polyethylene glycol (MIRALAX / GLYCOLAX) 17 g packet Place 17  g into feeding tube daily as needed for mild constipation.  . rosuvastatin (CRESTOR) 5 MG tablet Place 1 tablet (5 mg total) into feeding tube daily.  . Water For Irrigation, Sterile (FREE WATER) SOLN Place 200 mLs into feeding tube every 4 (four) hours.   No facility-administered encounter medications on file as of 05/07/2020.    Review of Systems unable to obtain due to  aphasia    Immunization History  Administered Date(s) Administered  . Influenza, High Dose Seasonal PF 05/04/2016  . PFIZER SARS-COV-2 Vaccination 10/03/2019, 10/24/2019  . Pneumococcal Polysaccharide-23 08/29/2003, 06/30/2009  . Zoster 01/29/2006   Pertinent  Health Maintenance Due  Topic Date Due  . FOOT EXAM  Never done  . OPHTHALMOLOGY EXAM  Never done  . URINE MICROALBUMIN  Never done  . MAMMOGRAM  Never done  . COLONOSCOPY  Never done  . DEXA SCAN  Never done  . PNA vac Low Risk Adult (1 of 2 - PCV13) 05/04/2011  . INFLUENZA VACCINE  03/28/2020  . HEMOGLOBIN A1C  08/25/2020    Vitals:   05/07/20 1217  BP: 125/69  Pulse: (!) 105  Resp: 16  Temp: 98.4 F (36.9 C)  TempSrc: Oral  Weight: 128 lb 12.8 oz (58.4 kg)  Height: 5\' 1"  (1.549 m)   Body mass index is 24.34 kg/m.  Physical Exam  GENERAL APPEARANCE: Well nourished. In no acute distress. Normal body habitus SKIN:  Skin is warm and dry.  MOUTH and THROAT: Lips are without lesions. Oral mucosa is moist and without lesions.  RESPIRATORY: Breathing is even & unlabored, BS CTAB CARDIAC: RRR, no murmur,no extra heart sounds, no edema GI: Abdomen soft, normal BS, +peg tube with ongoing feedings NEUROLOGICAL: There is no tremor. Aphasic PSYCHIATRIC:  Affect and behavior are appropriate  Labs reviewed: Recent Labs    04/04/20 0315 04/04/20 0315 04/05/20 0352 04/05/20 0352 04/05/20 06/05/20 04/06/20 0408 04/07/20 0555 04/09/20 0819 04/10/20 0251 04/22/20 0907 04/23/20 0314 04/26/20 0101  NA 128*   < > 128*   < >  --  137   < > 146*   < > 142 141 138  K 4.7   < > 4.2   < >  --  3.6   < > 3.7   < > 4.3 3.9 3.8  CL 93*   < > 94*   < >  --  102   < > 111   < > 107 107 107  CO2 20*   < > 19*   < >  --  21*   < > 24   < > 27 25 21*  GLUCOSE 219*   < > 137*   < >  --  115*   < > 178*   < > 176* 134* 181*  BUN 63*   < > 77*   < >  --  65*   < > 39*   < > 44* 36* 22  CREATININE 1.42*   < > 1.87*   < >  --  1.57*    < > 0.99   < > 0.64 0.59 0.56  CALCIUM 9.7   < > 9.2   < >  --  9.1   < > 9.2   < > 8.9 8.8* 8.7*  MG 2.0  --  2.1  --   --  2.1  --   --   --   --   --   --   PHOS 3.7   < >  --   --  4.5 4.1  --  3.5  --   --   --   --    < > = values in this interval not displayed.   Recent Labs    03/20/20 0216 03/20/20 0216 03/21/20 0415 03/22/20 0306 04/04/20 0315  AST 48*  --  29 60*  --   ALT 95*  --  91* 121*  --   ALKPHOS 57  --  56 54  --   BILITOT 0.6  --  0.5 0.6  --   PROT 6.8  --  6.8 6.8  --   ALBUMIN 2.6*   < > 2.6* 2.6* 2.5*   < > = values in this interval not displayed.   Recent Labs    04/19/20 0155 04/19/20 0155 04/20/20 0256 04/23/20 0314 04/26/20 0101  WBC 10.4   < > 12.1* 9.1 9.6  NEUTROABS 6.9  --  8.5* 4.9  --   HGB 8.4*   < > 8.6* 7.4* 8.3*  HCT 26.9*   < > 28.9* 24.7* 26.1*  MCV 97.5   < > 98.0 98.4 97.0  PLT 424*   < > 385 312 325   < > = values in this interval not displayed.   Lab Results  Component Value Date   TSH 2.232 03/08/2020   Lab Results  Component Value Date   HGBA1C 7 02/24/2020   Lab Results  Component Value Date   CHOL 223 (H) 02/16/2020   HDL 49 02/16/2020   LDLCALC 143 (H) 02/16/2020   TRIG 157 (H) 02/16/2020   CHOLHDL 4.6 02/16/2020    Significant Diagnostic Results in last 30 days:  CT ABDOMEN PELVIS WO CONTRAST  Result Date: 04/08/2020 CLINICAL DATA:  Fever, leukocytosis. Pneumonia, effusion or abscess suspected. EXAM: CT CHEST, ABDOMEN AND PELVIS WITHOUT CONTRAST TECHNIQUE: Multidetector CT imaging of the chest, abdomen and pelvis was performed following the standard protocol without IV contrast. COMPARISON:  CT abdomen dated 03/08/2020. FINDINGS: CT CHEST FINDINGS Cardiovascular: No thoracic aortic aneurysm. No pericardial effusion. Thoracic aortic atherosclerosis. Coronary artery calcifications, particularly dense within the LEFT anterior descending coronary artery. Mediastinum/Nodes: No mass or enlarged lymph nodes are seen  within the mediastinum. Esophagus is unremarkable. Trachea appears normal. Lungs/Pleura: Dense consolidations at the bilateral lung bases, RIGHT greater than LEFT. Additional patchy ground-glass opacities within the peripheral portions of each lung, most prominent within the upper lobes and LEFT lower lobe. No pleural effusion is seen. No pneumothorax. Musculoskeletal: No acute or suspicious osseous finding. Masslike density within the outer LEFT breast, of uncertain significance. CT ABDOMEN PELVIS FINDINGS Hepatobiliary: No focal liver abnormality. Gallbladder is unremarkable. No bile duct dilatation. Pancreas: Unremarkable. No pancreatic ductal dilatation or surrounding inflammatory changes. Spleen: Normal in size without focal abnormality. Adrenals/Urinary Tract: Adrenal glands appear normal. Kidneys are unremarkable without mass, stone or hydronephrosis. No perinephric fluid. Bladder is decompressed by Foley catheter. Stomach/Bowel: No dilated large or small bowel loops. Fluid, and associated air-fluid levels scattered throughout the large and small bowel, suggesting ileus. Diverticulosis of the sigmoid and descending colon but no focal inflammatory change to suggest acute diverticulitis. Gastrostomy tube appears appropriately positioned. Vascular/Lymphatic: Aortic atherosclerosis. No enlarged lymph nodes seen. Reproductive: No adnexal mass or free fluid Other: No free fluid or abscess collection is seen within the abdomen or pelvis. No free intraperitoneal air. Musculoskeletal: No acute or suspicious osseous finding. Surgical changes of kyphoplasty/vertebroplasty at the L2 vertebral body level. IMPRESSION: 1. Dense consolidations at the bilateral lung bases, RIGHT greater than  LEFT, consistent with multifocal pneumonia and/or aspiration pneumonitis. 2. Additional patchy ground-glass opacities within the peripheral portions of each lung, most prominent within the upper lobes and LEFT lower lobe. Differential  includes atypical pneumonias such as viral or fungal, interstitial pneumonias, edema related to volume overload/CHF, chronic interstitial diseases, hypersensitivity pneumonitis, and respiratory bronchiolitis. COVID-19 pneumonia can have this appearance. 3. Masslike density within the outer LEFT breast, of uncertain significance, neoplastic mass not excluded. Recommend correlation with diagnostic mammogram at a dedicated breast center after current issues are resolved. 4. Colonic diverticulosis without evidence of acute diverticulitis. 5. Fluid, and associated air-fluid levels, scattered throughout the large and small bowel, suggesting ileus. No evidence of bowel obstruction. 6. No free fluid or abscess collection is seen within the abdomen or pelvis. No free intraperitoneal air. Aortic Atherosclerosis (ICD10-I70.0). Electronically Signed   By: Bary Richard M.D.   On: 04/08/2020 13:35   CT CHEST WO CONTRAST  Result Date: 04/08/2020 CLINICAL DATA:  Fever, leukocytosis. Pneumonia, effusion or abscess suspected. EXAM: CT CHEST, ABDOMEN AND PELVIS WITHOUT CONTRAST TECHNIQUE: Multidetector CT imaging of the chest, abdomen and pelvis was performed following the standard protocol without IV contrast. COMPARISON:  CT abdomen dated 03/08/2020. FINDINGS: CT CHEST FINDINGS Cardiovascular: No thoracic aortic aneurysm. No pericardial effusion. Thoracic aortic atherosclerosis. Coronary artery calcifications, particularly dense within the LEFT anterior descending coronary artery. Mediastinum/Nodes: No mass or enlarged lymph nodes are seen within the mediastinum. Esophagus is unremarkable. Trachea appears normal. Lungs/Pleura: Dense consolidations at the bilateral lung bases, RIGHT greater than LEFT. Additional patchy ground-glass opacities within the peripheral portions of each lung, most prominent within the upper lobes and LEFT lower lobe. No pleural effusion is seen. No pneumothorax. Musculoskeletal: No acute or suspicious  osseous finding. Masslike density within the outer LEFT breast, of uncertain significance. CT ABDOMEN PELVIS FINDINGS Hepatobiliary: No focal liver abnormality. Gallbladder is unremarkable. No bile duct dilatation. Pancreas: Unremarkable. No pancreatic ductal dilatation or surrounding inflammatory changes. Spleen: Normal in size without focal abnormality. Adrenals/Urinary Tract: Adrenal glands appear normal. Kidneys are unremarkable without mass, stone or hydronephrosis. No perinephric fluid. Bladder is decompressed by Foley catheter. Stomach/Bowel: No dilated large or small bowel loops. Fluid, and associated air-fluid levels scattered throughout the large and small bowel, suggesting ileus. Diverticulosis of the sigmoid and descending colon but no focal inflammatory change to suggest acute diverticulitis. Gastrostomy tube appears appropriately positioned. Vascular/Lymphatic: Aortic atherosclerosis. No enlarged lymph nodes seen. Reproductive: No adnexal mass or free fluid Other: No free fluid or abscess collection is seen within the abdomen or pelvis. No free intraperitoneal air. Musculoskeletal: No acute or suspicious osseous finding. Surgical changes of kyphoplasty/vertebroplasty at the L2 vertebral body level. IMPRESSION: 1. Dense consolidations at the bilateral lung bases, RIGHT greater than LEFT, consistent with multifocal pneumonia and/or aspiration pneumonitis. 2. Additional patchy ground-glass opacities within the peripheral portions of each lung, most prominent within the upper lobes and LEFT lower lobe. Differential includes atypical pneumonias such as viral or fungal, interstitial pneumonias, edema related to volume overload/CHF, chronic interstitial diseases, hypersensitivity pneumonitis, and respiratory bronchiolitis. COVID-19 pneumonia can have this appearance. 3. Masslike density within the outer LEFT breast, of uncertain significance, neoplastic mass not excluded. Recommend correlation with diagnostic  mammogram at a dedicated breast center after current issues are resolved. 4. Colonic diverticulosis without evidence of acute diverticulitis. 5. Fluid, and associated air-fluid levels, scattered throughout the large and small bowel, suggesting ileus. No evidence of bowel obstruction. 6. No free fluid or abscess collection is seen within  the abdomen or pelvis. No free intraperitoneal air. Aortic Atherosclerosis (ICD10-I70.0). Electronically Signed   By: Bary Richard M.D.   On: 04/08/2020 13:35   DG CHEST PORT 1 VIEW  Result Date: 04/21/2020 CLINICAL DATA:  Fever and hypertension EXAM: PORTABLE CHEST 1 VIEW COMPARISON:  April 04, 2020 FINDINGS: There is ill-defined airspace opacity in the right perihilar region. There is a small granuloma in the right apex. Lungs elsewhere clear. Heart size and pulmonary vascularity are normal. No adenopathy. No bone lesions. IMPRESSION: Ill-defined opacity concerning for pneumonia in the right perihilar region. Small granuloma right apex. Lungs elsewhere clear. Cardiac silhouette within normal limits. No adenopathy. Electronically Signed   By: Bretta Bang III M.D.   On: 04/21/2020 15:36    Assessment/Plan  1. History of CVA with residual deficit -  Aphasic, currently having PTand OT, for therapeutic strengthening exercises -Continue clopidogrel and rosuvastatin - Palliative care following   2. DM (diabetes mellitus), type 2 with peripheral vascular complications [facility]) Lab Results  Component Value Date   HGBA1C 7 02/24/2020   - stable, continue Levemir and monitor CBGs  3. Essential (primary) hypertension -Stable, continue amlodipine and metoprolol tartrate  4. Dysphagia, unspecified type -Continue tube feedings of Glucerna 1.2 cal @ 40 ml/hour, aspiration precautions -  Speech therapy ongoing     Family/ staff Communication: Discussed plan of care with resident and charge nurse.  Labs/tests ordered: None  Goals of care:   Long-term  care/palliative care  Kenard Gower, DNP, MSN, FNP-BC [facility] and [facility] [phone] (Monday-Friday 8:00 a.m. - 5:00 p.m.) [phone] (after hours)

## 2020-05-11 ENCOUNTER — Non-Acute Institutional Stay: Payer: Self-pay | Admitting: Hospice

## 2020-05-11 ENCOUNTER — Other Ambulatory Visit: Payer: Self-pay

## 2020-05-11 DIAGNOSIS — Z515 Encounter for palliative care: Secondary | ICD-10-CM

## 2020-05-11 DIAGNOSIS — R1312 Dysphagia, oropharyngeal phase: Secondary | ICD-10-CM

## 2020-05-11 NOTE — Progress Notes (Signed)
[place] Consult Note Telephone: [phone]  Fax: [phone]  PATIENT NAME: Tasha [name] [address] [place] [address] [phone] (home)  DOB: 1946-08-19 MRN: [id number]  PRIMARY CARE PROVIDER:   Alben Spittle NP Patient, No Pcp Per,  No address on file None  REFERRING PROVIDER:  Alben Spittle NP No referring provider defined for this encounter. N/A  RESPONSIBLE PARTY:   Extended Emergency Contact Information Primary Emergency Contact: veverly, larimer Mobile Phone: [phone] Relation: Son Secondary Emergency Contact: Cledith, Kamiya Mobile Phone: [phone] Relation: Daughter  I met face to face with patient and family in home/facility.   RECOMMENDATIONS/PLAN:    Visit at the request of Alben Spittle NP for palliative consult. Visit consisted of building trust and discussions on Palliative Medicine as specialized medical care for people living with serious illness, aimed at facilitating better quality of life through symptoms relief, assisting with advance care plan and establishing goals of care.  Palliative care and hospice have similar goals of managing symptoms, promoting comfort, improving quality of life, and maintaining a person's dignity. However, palliative care may be offered during any phase of a serious illness, while hospice care is usually offered when a person is expected to live for 6 months or less. NP called and spoke with [name] who is familiar with components of palliative care.  He reported patient was recently assessed for hospice service but family decided she rather get on palliative service instead.  He endorsed palliative services and expressed appreciation for the call/update.  Advance Care Planning/Goals of Care: Goals include to maximize quality of life and symptom management. Our advance care planning conversation included a discussion about:    The value and importance of  advance care planning  Experiences with loved ones who have been seriously ill or have died  Exploration of goals of care in the event of a sudden injury or illness  Identification and preparation of a healthcare agent   CODE STATUS: CODE STATUS reviewed.  Patient is a full code   [name] explained that a full CODE STATUS was patient's wish and will stay that way for now.  Therapeutic listening and validation provided.  GOALS OF CARE: Goals of care include to maximize quality of life and symptom management.  Follow up Palliative Care Visit: Palliative care will continue to follow for goals of care clarification and symptom management.  Follow-up in 2 months   Functional decline/Symptom Management: Patient is alert and oriented  Chart review indicates hospitalized 7/12-8/31/2058for acute encephalopathy associated with intractable nausea and vomiting complicated by AKI and lactic acidosis. On 03/14/2020 - acute left pontine infarct and acute small infarct in the posterior right lateral ventricle; stroke complicated by severe dysphagia and pneumonia. She was treated with Zosyn; PEG placement - 8/4. Glucerna 1.2 infusing via enteral feeding pump continously @ 58ml/hr and water flushes 257ml q4hr. Patient is followed by ST.  Wound nurse following for sacral wound.  Patient is mostly bedbound, nonambulatory, requires assistance in activities of daily living. Remains incontinent of B&B. Receives PT/OT skilled services are ongoing. Total care. Nursing with no concerns at this time. Encouraged ongoing care.  Palliative will continue to monitor for symptom management/decline and make recommendations as needed.   Family /Caregiver/Community Supports: [name] is involved in patient's care.  Strong family support network identified.  I spent one hour and 20  minutes providing this consultation; time iincludes time spent with patient/family, chart review, provider coordination,  and documentation. More than  50% of the  time in this consultation was spent on coordinating communication  CHIEF COMPLAIN/HISTORY OF PRESENT ILLNESS:  Tasha [name] is a 74 y.o. female with multiple medical problems including hx of MI, HTN Stroke, Dysphagia, Type 2 DM. Palliative Care was asked to follow this patient by consultation request of Alben Spittle NP to help address advance care planning and goals of care.  CODE STATUS: Full  PPS: 30%  HOSPICE ELIGIBILITY/DIAGNOSIS: TBD  PAST MEDICAL HISTORY:  Past Medical History:  Diagnosis Date  . Diabetes mellitus without complication ([place])   . Essential (primary) hypertension   . History of falling   . MI (myocardial infarction) ([place])   . Stroke [facility])     SOCIAL HX:  Social History   Tobacco Use  . Smoking status: Never Smoker  . Smokeless tobacco: Never Used  Substance Use Topics  . Alcohol use: Yes    Comment: occasionally   FAMILY HX:  Family History  Adopted: Yes  Family history unknown: Yes    ALLERGIES:  Allergies  Allergen Reactions  . Allopurinol Shortness Of Breath  . Tetracycline Anaphylaxis  . Amoxicillin-Pot Clavulanate Diarrhea  . Atorvastatin Other (See Comments)    Unknown reaction - reported by [facility] [place] clinic 05/10/2012  . Azithromycin Other (See Comments)    Caused blisters inside and out  . Codeine Other (See Comments)    Altered mental status  . Lisinopril Other (See Comments)    Unknown reaction - reported by [facility] [place] clinic 12/23/2008 02/20/2020 she believes "reaction" was NP cough. No PMH of angioedema  . Sulfa Antibiotics Diarrhea     PERTINENT MEDICATIONS:  Outpatient Encounter Medications as of 05/11/2020  Medication Sig  . amLODipine (NORVASC) 10 MG tablet Place 10 mg into feeding tube daily.  . bisacodyl (DULCOLAX) 10 MG suppository Place 10 mg rectally as needed for moderate constipation. If MOM doesn't work  . clopidogrel (PLAVIX) 75 MG tablet Place 1 tablet (75 mg total) into feeding tube daily.  . insulin detemir  (LEVEMIR) 100 UNIT/ML injection Inject 0.1 mLs (10 Units total) into the skin 2 (two) times daily.  . metoprolol tartrate (LOPRESSOR) 25 MG tablet Place 25 mg into feeding tube 2 (two) times daily.  . Nutritional Supplements (FEEDING SUPPLEMENT, GLUCERNA 1.2 CAL,) LIQD Place 40 mLs into feeding tube daily. 40 mL x24 hours via PEG  . pantoprazole sodium (PROTONIX) 40 mg/20 mL PACK Place 20 mLs (40 mg total) into feeding tube daily.  . polyethylene glycol (MIRALAX / GLYCOLAX) 17 g packet Place 17 g into feeding tube daily as needed for mild constipation.  . rosuvastatin (CRESTOR) 5 MG tablet Place 1 tablet (5 mg total) into feeding tube daily.  . Water For Irrigation, Sterile (FREE WATER) SOLN Place 200 mLs into feeding tube every 4 (four) hours.   No facility-administered encounter medications on file as of 05/11/2020.    PHYSICAL EXAM / ROS:  General: NAD, cooperative Cardiovascular: regular rate and rhythm, no chest pain reported Pulmonary: no cough, no shortness of breath, clear ant/post fields, normal respiratory effort Abdomen: soft, non tender, positive bowel sounds in all quadrants; PEG tube in place, patent GU: denies dysuria, no suprapubic tenderness Extremities: no edema, Prevalon boots to bilateral lower extremities Skin: no rashes to exposed skin Neurological: Weakness but otherwise non focal  Teodoro Spray, NP

## 2020-05-17 ENCOUNTER — Encounter: Payer: Self-pay | Admitting: Adult Health

## 2020-05-17 ENCOUNTER — Non-Acute Institutional Stay (SKILLED_NURSING_FACILITY): Payer: Self-pay | Admitting: Adult Health

## 2020-05-17 DIAGNOSIS — I10 Essential (primary) hypertension: Secondary | ICD-10-CM

## 2020-05-17 DIAGNOSIS — I6930 Unspecified sequelae of cerebral infarction: Secondary | ICD-10-CM

## 2020-05-17 DIAGNOSIS — E1151 Type 2 diabetes mellitus with diabetic peripheral angiopathy without gangrene: Secondary | ICD-10-CM

## 2020-05-17 DIAGNOSIS — R1310 Dysphagia, unspecified: Secondary | ICD-10-CM

## 2020-05-17 NOTE — Progress Notes (Signed)
Location:  [facility] Room Number: 227-A Place of Service:  SNF (31) Provider:  Kenard Gower, DNP, FNP-BC  Patient Care Team: Patient, No Pcp Per as PCP - General (General Practice)  Extended Emergency Contact Information Primary Emergency Contact: tyronica, truxillo Mobile Phone: [phone] Relation: Son Secondary Emergency Contact: Audriella, Blakeley Mobile Phone: [phone] Relation: Daughter  Code Status:  FULL CODE  Goals of care: Advanced Directive information Advanced Directives 03/08/2020  Does Patient Have a Medical Advance Directive? No  Would patient like information on creating a medical advance directive? No - Patient declined     Chief Complaint  Patient presents with  . Medical Management of Chronic Issues    Short-term rehabilitation visit    HPI:  Pt is a 75 y.o. female seen today for medical management of chronic diseases.  She is a short-term rehabilitation resident of [facility].  She has a PMH of MI, dyslipidemia and essential hypertension.  She was seen in her room today.  She is aphasic, had a recent CVA. SBPs ranging from 110 to 143.  She takes amlodipine 10 mg daily and metoprolol tartrate 25 mg twice a day for hypertension. She tolerates her tube feedings, Glucerna 1.2 cal @ 40 ml/hour.     Past Medical History:  Diagnosis Date  . Diabetes mellitus without complication (HCC)   . Essential (primary) hypertension   . History of falling   . MI (myocardial infarction) (HCC)   . Stroke [facility])    Past Surgical History:  Procedure Laterality Date  . ABDOMINAL HYSTERECTOMY    . APPENDECTOMY    . BACK SURGERY    . IR GASTROSTOMY TUBE MOD SED  03/31/2020    Allergies  Allergen Reactions  . Allopurinol Shortness Of Breath  . Tetracycline Anaphylaxis  . Amoxicillin-Pot Clavulanate Diarrhea  . Atorvastatin Other (See Comments)    Unknown reaction - reported by [facility], [place] clinic 05/10/2012  .  Azithromycin Other (See Comments)    Caused blisters inside and out  . Codeine Other (See Comments)    Altered mental status  . Lisinopril Other (See Comments)    Unknown reaction - reported by [facility] [place], [place] clinic 12/23/2008 02/20/2020 she believes "reaction" was NP cough. No PMH of angioedema  . Sulfa Antibiotics Diarrhea    Outpatient Encounter Medications as of 05/17/2020  Medication Sig  . Amino Acids-Protein Hydrolys (FEEDING SUPPLEMENT, PRO-STAT SUGAR FREE 64,) LIQD Place 30 mLs into feeding tube daily.  Marland Kitchen amLODipine (NORVASC) 10 MG tablet Place 10 mg into feeding tube daily.  . bisacodyl (DULCOLAX) 10 MG suppository Place 10 mg rectally as needed for moderate constipation. If MOM doesn't work  . clopidogrel (PLAVIX) 75 MG tablet Place 1 tablet (75 mg total) into feeding tube daily.  . insulin detemir (LEVEMIR) 100 UNIT/ML injection Inject 0.1 mLs (10 Units total) into the skin 2 (two) times daily.  . metoprolol tartrate (LOPRESSOR) 25 MG tablet Place 25 mg into feeding tube 2 (two) times daily.  . pantoprazole sodium (PROTONIX) 40 mg/20 mL PACK Place 20 mLs (40 mg total) into feeding tube daily.  . polyethylene glycol (MIRALAX / GLYCOLAX) 17 g packet Place 17 g into feeding tube daily as needed for mild constipation.  . rosuvastatin (CRESTOR) 5 MG tablet Place 1 tablet (5 mg total) into feeding tube daily.  . Water For Irrigation, Sterile (FREE WATER) SOLN Place 200 mLs into feeding tube every 4 (four) hours.  . [DISCONTINUED] Nutritional Supplements (FEEDING SUPPLEMENT, GLUCERNA  1.2 CAL,) LIQD Place 40 mLs into feeding tube daily. 40 mL x24 hours via PEG   No facility-administered encounter medications on file as of 05/17/2020.    Review of Systems  Unable to obtain due to aphasia    Immunization History  Administered Date(s) Administered  . Influenza, High Dose Seasonal PF 05/04/2016  . PFIZER SARS-COV-2 Vaccination 10/03/2019, 10/24/2019  . Pneumococcal Polysaccharide-23  08/29/2003, 06/30/2009  . Zoster 01/29/2006   Pertinent  Health Maintenance Due  Topic Date Due  . FOOT EXAM  Never done  . OPHTHALMOLOGY EXAM  Never done  . URINE MICROALBUMIN  Never done  . MAMMOGRAM  Never done  . COLONOSCOPY  Never done  . DEXA SCAN  Never done  . PNA vac Low Risk Adult (1 of 2 - PCV13) 05/04/2011  . INFLUENZA VACCINE  11/25/2020 (Originally 03/28/2020)  . HEMOGLOBIN A1C  08/25/2020    Vitals:   05/17/20 1105  BP: 110/71  Pulse: 72  Resp: 20  Temp: (!) 97.1 F (36.2 C)  TempSrc: Oral  Weight: 127 lb 9.6 oz (57.9 kg)  Height: 5\' 1"  (1.549 m)   Body mass index is 24.11 kg/m.  Physical Exam  GENERAL APPEARANCE: Well nourished. In no acute distress. Normal body habitus SKIN:  Sacral wound with aquacel dressing MOUTH and THROAT: Lips are without lesions. Oral mucosa is moist and without lesions.  RESPIRATORY: Breathing is even & unlabored, BS CTAB CARDIAC: RRR, no murmur,no extra heart sounds, no edema GI: Abdomen soft, normal BS, no masses, no tenderness NEUROLOGICAL: There is no tremor. Aphasic. No movement on all 4 extremities PSYCHIATRIC:  Affect and behavior are appropriate  Labs reviewed: Recent Labs    04/04/20 0315 04/04/20 0315 04/05/20 0352 04/05/20 0352 04/05/20 0942 04/06/20 0408 04/07/20 0555 04/09/20 0819 04/10/20 0251 04/22/20 0907 04/23/20 0314 04/26/20 0101  NA 128*   < > 128*   < >  --  137   < > 146*   < > 142 141 138  K 4.7   < > 4.2   < >  --  3.6   < > 3.7   < > 4.3 3.9 3.8  CL 93*   < > 94*   < >  --  102   < > 111   < > 107 107 107  CO2 20*   < > 19*   < >  --  21*   < > 24   < > 27 25 21*  GLUCOSE 219*   < > 137*   < >  --  115*   < > 178*   < > 176* 134* 181*  BUN 63*   < > 77*   < >  --  65*   < > 39*   < > 44* 36* 22  CREATININE 1.42*   < > 1.87*   < >  --  1.57*   < > 0.99   < > 0.64 0.59 0.56  CALCIUM 9.7   < > 9.2   < >  --  9.1   < > 9.2   < > 8.9 8.8* 8.7*  MG 2.0  --  2.1  --   --  2.1  --   --   --   --    --   --   PHOS 3.7   < >  --   --  4.5 4.1  --  3.5  --   --   --   --    < > =  values in this interval not displayed.   Recent Labs    03/20/20 0216 03/20/20 0216 03/21/20 0415 03/22/20 0306 04/04/20 0315  AST 48*  --  29 60*  --   ALT 95*  --  91* 121*  --   ALKPHOS 57  --  56 54  --   BILITOT 0.6  --  0.5 0.6  --   PROT 6.8  --  6.8 6.8  --   ALBUMIN 2.6*   < > 2.6* 2.6* 2.5*   < > = values in this interval not displayed.   Recent Labs    04/19/20 0155 04/19/20 0155 04/20/20 0256 04/23/20 0314 04/26/20 0101  WBC 10.4   < > 12.1* 9.1 9.6  NEUTROABS 6.9  --  8.5* 4.9  --   HGB 8.4*   < > 8.6* 7.4* 8.3*  HCT 26.9*   < > 28.9* 24.7* 26.1*  MCV 97.5   < > 98.0 98.4 97.0  PLT 424*   < > 385 312 325   < > = values in this interval not displayed.   Lab Results  Component Value Date   TSH 2.232 03/08/2020   Lab Results  Component Value Date   HGBA1C 7 02/24/2020   Lab Results  Component Value Date   CHOL 223 (H) 02/16/2020   HDL 49 02/16/2020   LDLCALC 143 (H) 02/16/2020   TRIG 157 (H) 02/16/2020   CHOLHDL 4.6 02/16/2020    Significant Diagnostic Results in last 30 days:  DG CHEST PORT 1 VIEW  Result Date: 04/21/2020 CLINICAL DATA:  Fever and hypertension EXAM: PORTABLE CHEST 1 VIEW COMPARISON:  April 04, 2020 FINDINGS: There is ill-defined airspace opacity in the right perihilar region. There is a small granuloma in the right apex. Lungs elsewhere clear. Heart size and pulmonary vascularity are normal. No adenopathy. No bone lesions. IMPRESSION: Ill-defined opacity concerning for pneumonia in the right perihilar region. Small granuloma right apex. Lungs elsewhere clear. Cardiac silhouette within normal limits. No adenopathy. Electronically Signed   By: Bretta Bang III M.D.   On: 04/21/2020 15:36    Assessment/Plan  1. Essential (primary) hypertension -Stable, continue amlodipine and metoprolol tartrate  2. History of CVA with residual deficit -Stable,  continue Plavix and rosuvastatin -Continue PT and OT, for therapeutic strengthening exercises -SP working on speech/language deficits/ communication  3. DM (diabetes mellitus), type 2 with peripheral vascular complications (HCC) Lab Results  Component Value Date   HGBA1C 7 02/24/2020   -  Stable, continue Levemir and CBG checks  4. Dysphagia, unspecified type -Continue Glucerna 1.2 cal 40 mL/hour via PEG tube -Aspiration precautions -Speech therapy working on swallowing issues    Family/ staff Communication: Discussed plan of care with charge nurse.  Labs/tests ordered: None  Goals of care:   Short-term rehabilitation/palliative care   Kenard Gower, DNP, MSN, FNP-BC [facility] [phone] (Monday-Friday 8:00 a.m. - 5:00 p.m.) [phone] (after hours)

## 2020-05-24 ENCOUNTER — Encounter: Payer: Self-pay | Admitting: Adult Health

## 2020-05-26 NOTE — Progress Notes (Signed)
This encounter was created in error - please disregard.

## 2020-06-11 ENCOUNTER — Encounter: Payer: Self-pay | Admitting: Adult Health

## 2020-06-11 ENCOUNTER — Non-Acute Institutional Stay (SKILLED_NURSING_FACILITY): Payer: Self-pay | Admitting: Adult Health

## 2020-06-11 DIAGNOSIS — I6930 Unspecified sequelae of cerebral infarction: Secondary | ICD-10-CM

## 2020-06-11 DIAGNOSIS — E1151 Type 2 diabetes mellitus with diabetic peripheral angiopathy without gangrene: Secondary | ICD-10-CM

## 2020-06-11 DIAGNOSIS — R1310 Dysphagia, unspecified: Secondary | ICD-10-CM

## 2020-06-11 DIAGNOSIS — I10 Essential (primary) hypertension: Secondary | ICD-10-CM

## 2020-06-11 NOTE — Progress Notes (Signed)
Location:  [facility] Room Number: 227-A Place of Service:  SNF (31) Provider:  Kenard Gower, DNP, FNP-BC  Patient Care Team: Patient, No Pcp Per as PCP - General (General Practice)  Extended Emergency Contact Information Primary Emergency Contact: travonna, swindle Mobile Phone: [phone] Relation: Son Secondary Emergency Contact: Aleksandra, Raben Mobile Phone: [phone] Relation: Daughter  Code Status:  FULL CODE  Goals of care: Advanced Directive information Advanced Directives 03/08/2020  Does Patient Have a Medical Advance Directive? No  Would patient like information on creating a medical advance directive? No - Patient declined     Chief Complaint  Patient presents with   Medical Management of Chronic Issues    Routine [facility] visit    HPI:  Pt is a 74 y.o. female seen today for medical management of chronic diseases.  She is a long-term care resident of [facility].  She has a PMH of dyslipidemia and essential hypertension.  CBGs ranging from 109 to 233.  She states Levemir 10 units twice a day for diabetes mellitus.  SBP's ranging from 129-158.  She takes amlodipine 10 mg daily and metoprolol tartrate 25 mg twice a day for hypertension.   Past Medical History:  Diagnosis Date   Diabetes mellitus without complication (HCC)    Essential (primary) hypertension    History of falling    MI (myocardial infarction) (HCC)    Stroke [facility])    Past Surgical History:  Procedure Laterality Date   ABDOMINAL HYSTERECTOMY     APPENDECTOMY     BACK SURGERY     IR GASTROSTOMY TUBE MOD SED  03/31/2020    Allergies  Allergen Reactions   Allopurinol Shortness Of Breath   Tetracycline Anaphylaxis   Amoxicillin-Pot Clavulanate Diarrhea   Atorvastatin Other (See Comments)    Unknown reaction - reported by [facility], [place] clinic 05/10/2012   Azithromycin Other (See Comments)    Caused blisters inside and  out   Codeine Other (See Comments)    Altered mental status   Lisinopril Other (See Comments)    Unknown reaction - reported by [facility], [place] clinic 12/23/2008 02/20/2020 she believes "reaction" was NP cough. No PMH of angioedema   Sulfa Antibiotics Diarrhea    Outpatient Encounter Medications as of 06/11/2020  Medication Sig   Amino Acids-Protein Hydrolys (FEEDING SUPPLEMENT, PRO-STAT SUGAR FREE 64,) LIQD Place 30 mLs into feeding tube daily.   amLODipine (NORVASC) 10 MG tablet Place 10 mg into feeding tube daily.   bisacodyl (DULCOLAX) 10 MG suppository Place 10 mg rectally as needed for moderate constipation. If MOM doesn't work   clopidogrel (PLAVIX) 75 MG tablet Place 1 tablet (75 mg total) into feeding tube daily.   insulin detemir (LEVEMIR) 100 UNIT/ML injection Inject 0.1 mLs (10 Units total) into the skin 2 (two) times daily.   metoprolol tartrate (LOPRESSOR) 25 MG tablet Place 25 mg into feeding tube 2 (two) times daily.   pantoprazole sodium (PROTONIX) 40 mg/20 mL PACK Place 20 mLs (40 mg total) into feeding tube daily.   polyethylene glycol (MIRALAX / GLYCOLAX) 17 g packet Place 17 g into feeding tube daily as needed for mild constipation.   rosuvastatin (CRESTOR) 5 MG tablet Place 1 tablet (5 mg total) into feeding tube daily.   Water For Irrigation, Sterile (FREE WATER) SOLN Place 200 mLs into feeding tube every 4 (four) hours.   No facility-administered encounter medications on file as of 06/11/2020.    Review of Systems  Unable  to obtain due to aphasia   Immunization History  Administered Date(s) Administered   Influenza, High Dose Seasonal PF 05/04/2016   PFIZER SARS-COV-2 Vaccination 10/03/2019, 10/24/2019   Pneumococcal Polysaccharide-23 08/29/2003, 06/30/2009   Zoster 01/29/2006   Pertinent  Health Maintenance Due  Topic Date Due   FOOT EXAM  Never done   OPHTHALMOLOGY EXAM  Never done   URINE MICROALBUMIN  Never done   MAMMOGRAM  Never  done   COLONOSCOPY  Never done   DEXA SCAN  Never done   PNA vac Low Risk Adult (1 of 2 - PCV13) 05/04/2011   INFLUENZA VACCINE  11/25/2020 (Originally 03/28/2020)   HEMOGLOBIN A1C  08/25/2020    Vitals:   06/11/20 1408  BP: 139/69  Pulse: 75  Resp: 20  Temp: 97.7 F (36.5 C)  TempSrc: Oral  Weight: 127 lb 6.4 oz (57.8 kg)  Height: 5\' 1"  (1.549 m)   Body mass index is 24.07 kg/m.  Physical Exam  GENERAL APPEARANCE: Well nourished. In no acute distress. Normal body habitus SKIN:  Skin is warm and dry.  MOUTH and THROAT: Lips are without lesions. Oral mucosa is moist and without lesions.  RESPIRATORY: Breathing is even & unlabored, BS CTAB CARDIAC: RRR, no murmur,no extra heart sounds, no edema GI: Abdomen soft, normal BS, +peg tube GU:  Has foley catheter NEUROLOGICAL: There is no tremor. Aphasic.Not moving any extremities. PSYCHIATRIC:  Affect and behavior are appropriate  Labs reviewed: Recent Labs    04/04/20 0315 04/04/20 0315 04/05/20 0352 04/05/20 0352 04/05/20 06/05/20 04/06/20 0408 04/07/20 0555 04/09/20 0819 04/10/20 0251 04/22/20 0907 04/23/20 0314 04/26/20 0101  NA 128*   < > 128*   < >  --  137   < > 146*   < > 142 141 138  K 4.7   < > 4.2   < >  --  3.6   < > 3.7   < > 4.3 3.9 3.8  CL 93*   < > 94*   < >  --  102   < > 111   < > 107 107 107  CO2 20*   < > 19*   < >  --  21*   < > 24   < > 27 25 21*  GLUCOSE 219*   < > 137*   < >  --  115*   < > 178*   < > 176* 134* 181*  BUN 63*   < > 77*   < >  --  65*   < > 39*   < > 44* 36* 22  CREATININE 1.42*   < > 1.87*   < >  --  1.57*   < > 0.99   < > 0.64 0.59 0.56  CALCIUM 9.7   < > 9.2   < >  --  9.1   < > 9.2   < > 8.9 8.8* 8.7*  MG 2.0  --  2.1  --   --  2.1  --   --   --   --   --   --   PHOS 3.7   < >  --   --  4.5 4.1  --  3.5  --   --   --   --    < > = values in this interval not displayed.   Recent Labs    03/20/20 0216 03/20/20 0216 03/21/20 0415 03/22/20 0306 04/04/20 0315  AST 48*   --  29 60*  --   ALT 95*  --  91* 121*  --   ALKPHOS 57  --  56 54  --   BILITOT 0.6  --  0.5 0.6  --   PROT 6.8  --  6.8 6.8  --   ALBUMIN 2.6*   < > 2.6* 2.6* 2.5*   < > = values in this interval not displayed.   Recent Labs    04/19/20 0155 04/19/20 0155 04/20/20 0256 04/23/20 0314 04/26/20 0101  WBC 10.4   < > 12.1* 9.1 9.6  NEUTROABS 6.9  --  8.5* 4.9  --   HGB 8.4*   < > 8.6* 7.4* 8.3*  HCT 26.9*   < > 28.9* 24.7* 26.1*  MCV 97.5   < > 98.0 98.4 97.0  PLT 424*   < > 385 312 325   < > = values in this interval not displayed.   Lab Results  Component Value Date   TSH 2.232 03/08/2020   Lab Results  Component Value Date   HGBA1C 7 02/24/2020   Lab Results  Component Value Date   CHOL 223 (H) 02/16/2020   HDL 49 02/16/2020   LDLCALC 143 (H) 02/16/2020   TRIG 157 (H) 02/16/2020   CHOLHDL 4.6 02/16/2020    Assessment/Plan  1. DM (diabetes mellitus), type 2 with peripheral vascular complications [facility]) Lab Results  Component Value Date   HGBA1C 7 02/24/2020   -Continue Levemir and CBG checks  2. History of CVA with residual deficit -Stable, continue Plavix and rosuvastatin  3. Essential (primary) hypertension -Stable, continue amlodipine and metoprolol tartrate  4. Dysphagia, unspecified type -On PEG tube feedings -Aspiration precautions   Family/ staff Communication: Discussed plan of care with charge nurse.  Labs/tests ordered: None  Goals of care:   Long-term care   Kenard Gower, DNP, MSN, FNP-BC [facility] and Adult Medicine [phone] (Monday-Friday 8:00 a.m. - 5:00 p.m.) [phone] (after hours)

## 2020-07-12 ENCOUNTER — Encounter: Payer: Self-pay | Admitting: Adult Health

## 2020-07-12 ENCOUNTER — Non-Acute Institutional Stay (SKILLED_NURSING_FACILITY): Payer: Self-pay | Admitting: Adult Health

## 2020-07-12 DIAGNOSIS — I10 Essential (primary) hypertension: Secondary | ICD-10-CM

## 2020-07-12 DIAGNOSIS — E1151 Type 2 diabetes mellitus with diabetic peripheral angiopathy without gangrene: Secondary | ICD-10-CM

## 2020-07-12 DIAGNOSIS — R1310 Dysphagia, unspecified: Secondary | ICD-10-CM

## 2020-07-12 DIAGNOSIS — I6930 Unspecified sequelae of cerebral infarction: Secondary | ICD-10-CM

## 2020-07-12 NOTE — Progress Notes (Signed)
Location:  [facility] Room Number: 227-A Place of Service:  SNF (31) Provider:  Kenard Gower, DNP, FNP-BC  Patient Care Team: Pecola Lawless, MD as PCP - General (Internal Medicine) Medina-Vargas, Margit Banda, NP as Nurse Practitioner (Internal Medicine) Rehab[facility]  Extended Emergency Contact Information Primary Emergency Contact: rosaleah, person Mobile Phone: [phone] Relation: Son Secondary Emergency Contact: Catina, Nuss Mobile Phone: [phone] Relation: Daughter  Code Status:  FULL CODE  Goals of care: Advanced Directive information Advanced Directives 03/08/2020  Does Patient Have a Medical Advance Directive? No  Would patient like information on creating a medical advance directive? No - Patient declined     Chief Complaint  Patient presents with   Medical Management of Chronic Issues    Routine [facility] visit    HPI:  Pt is a 73 y.o. female seen today for medical management of chronic diseases. She is a long-term care resident of [facility].  She has a PMH of MI, dyslipidemia and essential hypertension. She was seen in the room today. She is nonverbal. She has history of CVA resulting to dysphagia and needing total care. She is unable to move all 4 extremities. Currently, she has on-going restorative program, 1) Splint/brace care, donned and doffed to bilateral hands, and 2) PROM BUE. SBPs ranging from 133 to 157, with outlier 191. She takes Metoprolol tartrate 25 mg BID and Amlodipine 10 mg daily for hypertension.   Past Medical History:  Diagnosis Date   Diabetes mellitus without complication (HCC)    Essential (primary) hypertension    History of falling    MI (myocardial infarction) (HCC)    Stroke [facility])    Past Surgical History:  Procedure Laterality Date   ABDOMINAL HYSTERECTOMY     APPENDECTOMY     BACK SURGERY     IR GASTROSTOMY  TUBE MOD SED  03/31/2020    Allergies  Allergen Reactions   Allopurinol Shortness Of Breath   Tetracycline Anaphylaxis   Amoxicillin-Pot Clavulanate Diarrhea   Atorvastatin Other (See Comments)    Unknown reaction - reported by [facility], [place] clinic 05/10/2012   Azithromycin Other (See Comments)    Caused blisters inside and out   Codeine Other (See Comments)    Altered mental status   Lisinopril Other (See Comments)    Unknown reaction - reported by [facility], [place] clinic 12/23/2008 02/20/2020 she believes "reaction" was NP cough. No PMH of angioedema   Sulfa Antibiotics Diarrhea    Outpatient Encounter Medications as of 07/12/2020  Medication Sig   Amino Acids-Protein Hydrolys (FEEDING SUPPLEMENT, PRO-STAT SUGAR FREE 64,) LIQD Place 30 mLs into feeding tube daily.   amLODipine (NORVASC) 10 MG tablet Place 10 mg into feeding tube daily.   bisacodyl (DULCOLAX) 10 MG suppository Place 10 mg rectally as needed for moderate constipation. If MOM doesn't work   clopidogrel (PLAVIX) 75 MG tablet Place 1 tablet (75 mg total) into feeding tube daily.   insulin detemir (LEVEMIR) 100 UNIT/ML injection Inject 0.1 mLs (10 Units total) into the skin 2 (two) times daily.   metoprolol tartrate (LOPRESSOR) 25 MG tablet Place 25 mg into feeding tube 2 (two) times daily.   pantoprazole sodium (PROTONIX) 40 mg/20 mL PACK Place 20 mLs (40 mg total) into feeding tube daily.   polyethylene glycol (MIRALAX / GLYCOLAX) 17 g packet Place 17 g into feeding tube daily as needed for mild constipation.   rosuvastatin (CRESTOR) 5 MG tablet Place  1 tablet (5 mg total) into feeding tube daily.   Water For Irrigation, Sterile (FREE WATER) SOLN Place 200 mLs into feeding tube every 4 (four) hours.   No facility-administered encounter medications on file as of 07/12/2020.    Review of Systems  Unable to obtain due to being nonverbal    Immunization History  Administered Date(s) Administered    Influenza, High Dose Seasonal PF 05/04/2016   PFIZER SARS-COV-2 Vaccination 10/03/2019, 10/24/2019   Pneumococcal Polysaccharide-23 08/29/2003, 06/30/2009   Zoster 01/29/2006   Pertinent  Health Maintenance Due  Topic Date Due   FOOT EXAM  Never done   OPHTHALMOLOGY EXAM  Never done   URINE MICROALBUMIN  Never done   MAMMOGRAM  Never done   COLONOSCOPY  Never done   DEXA SCAN  Never done   PNA vac Low Risk Adult (1 of 2 - PCV13) 05/04/2011   INFLUENZA VACCINE  11/25/2020 (Originally 03/28/2020)   HEMOGLOBIN A1C  08/25/2020    Vitals:   07/12/20 1110  BP: 140/61  Pulse: 73  Resp: 17  Temp: 97.9 F (36.6 C)  TempSrc: Oral  Weight: 128 lb 6.4 oz (58.2 kg)  Height: 5\' 1"  (1.549 m)   Body mass index is 24.26 kg/m.  Physical Exam  GENERAL APPEARANCE: Well nourished. In no acute distress. Normal body habitus SKIN:  Left heel and sacral wound with dressing MOUTH and THROAT: Lips are without lesions. Oral mucosa is moist and without lesions.  RESPIRATORY: Breathing is even & unlabored, BS CTAB CARDIAC: RRR, no murmur,no extra heart sounds GI: Abdomen soft, normal BS, no masses, no tenderness NEUROLOGICAL: There is no tremor. Nonverbal.  Labs reviewed: Recent Labs    04/04/20 0315 04/04/20 0315 04/05/20 06/05/20 04/05/20 06/05/20 04/05/20 06/05/20 04/06/20 0408 04/07/20 0555 04/09/20 04/11/20 04/10/20 0251 04/22/20 0907 04/23/20 0314 04/26/20 0101  NA 128*   < > 128*   < >  --  137   < > 146*   < > 142 141 138  K 4.7   < > 4.2   < >  --  3.6   < > 3.7   < > 4.3 3.9 3.8  CL 93*   < > 94*   < >  --  102   < > 111   < > 107 107 107  CO2 20*   < > 19*   < >  --  21*   < > 24   < > 27 25 21*  GLUCOSE 219*   < > 137*   < >  --  115*   < > 178*   < > 176* 134* 181*  BUN 63*   < > 77*   < >  --  65*   < > 39*   < > 44* 36* 22  CREATININE 1.42*   < > 1.87*   < >  --  1.57*   < > 0.99   < > 0.64 0.59 0.56  CALCIUM 9.7   < > 9.2   < >  --  9.1   < > 9.2   < > 8.9 8.8* 8.7*  MG  2.0  --  2.1  --   --  2.1  --   --   --   --   --   --   PHOS 3.7   < >  --   --  4.5 4.1  --  3.5  --   --   --   --    < > =  values in this interval not displayed.   Recent Labs    03/20/20 0216 03/20/20 0216 03/21/20 0415 03/22/20 0306 04/04/20 0315  AST 48*  --  29 60*  --   ALT 95*  --  91* 121*  --   ALKPHOS 57  --  56 54  --   BILITOT 0.6  --  0.5 0.6  --   PROT 6.8  --  6.8 6.8  --   ALBUMIN 2.6*   < > 2.6* 2.6* 2.5*   < > = values in this interval not displayed.   Recent Labs    04/19/20 0155 04/19/20 0155 04/20/20 0256 04/23/20 0314 04/26/20 0101  WBC 10.4   < > 12.1* 9.1 9.6  NEUTROABS 6.9  --  8.5* 4.9  --   HGB 8.4*   < > 8.6* 7.4* 8.3*  HCT 26.9*   < > 28.9* 24.7* 26.1*  MCV 97.5   < > 98.0 98.4 97.0  PLT 424*   < > 385 312 325   < > = values in this interval not displayed.   Lab Results  Component Value Date   TSH 2.232 03/08/2020   Lab Results  Component Value Date   HGBA1C 7 02/24/2020   Lab Results  Component Value Date   CHOL 223 (H) 02/16/2020   HDL 49 02/16/2020   LDLCALC 143 (H) 02/16/2020   TRIG 157 (H) 02/16/2020   CHOLHDL 4.6 02/16/2020    Assessment/Plan  1. History of CVA with residual deficit -  Stable. continue Plavix and Atorvastatin -  Continue restorative program  2. Essential (primary) hypertension -  BPs elevated, will start on Hydralazine 25 mg Q 8 hours  - continue Amlodipine and Metoprolol tartrate  3. DM (diabetes mellitus), type 2 with peripheral vascular complications (HCC) Lab Results  Component Value Date   HGBA1C 7 02/24/2020   - stable, continue Levemir 10 units BID  4. Dysphagia, unspecified type -  Continue Glucerna 1.2 @ 40 ml/hour -  Aspiration precaution    Family/ staff Communication:  Discussed plan of care with charge nurse.  Labs/tests ordered:  None  Goals of care:   Long-term care  Kenard Gower, DNP, MSN, FNP-BC [facility] [phone]  (Monday-Friday 8:00 a.m. - 5:00 p.m.) [phone] (after hours)

## 2020-08-17 ENCOUNTER — Non-Acute Institutional Stay (SKILLED_NURSING_FACILITY): Payer: Self-pay | Admitting: Adult Health

## 2020-08-17 ENCOUNTER — Encounter: Payer: Self-pay | Admitting: Adult Health

## 2020-08-17 DIAGNOSIS — I6930 Unspecified sequelae of cerebral infarction: Secondary | ICD-10-CM

## 2020-08-17 DIAGNOSIS — E1151 Type 2 diabetes mellitus with diabetic peripheral angiopathy without gangrene: Secondary | ICD-10-CM

## 2020-08-17 DIAGNOSIS — I10 Essential (primary) hypertension: Secondary | ICD-10-CM

## 2020-08-17 DIAGNOSIS — R1310 Dysphagia, unspecified: Secondary | ICD-10-CM

## 2020-08-17 NOTE — Progress Notes (Addendum)
Location:  [facility] Room Number: 227-A Place of Service:  SNF (31) Provider:  Kenard Gower, DNP, FNP-BC  Patient Care Team: Pecola Lawless, MD as PCP - General (Internal Medicine) Medina-Vargas, Margit Banda, NP as Nurse Practitioner (Internal Medicine) Rehab, [facility] (Skilled Nursing Facility)  Extended Emergency Contact Information Primary Emergency Contact: detra, bores Mobile Phone: [phone] Relation: Son Secondary Emergency Contact: Alzena, Gerber Mobile Phone: [phone] Relation: Daughter  Code Status:  FULL CODE  Goals of care: Advanced Directive information Advanced Directives 03/08/2020  Does Patient Have a Medical Advance Directive? No  Would patient like information on creating a medical advance directive? No - Patient declined     Chief Complaint  Patient presents with  . Medical Management of Chronic Issues    Routine [facility] visit    HPI:  Pt is a 74 y.o. female seen today for medical management of chronic diseases. She is a long-term care resident of [facility]. She has a PMH of MI, dyslipidemia and essential hypertension. She is currently followed by Palliative care. She is currently having restorative care, 1) PROM BUE, and splint/brace care by donning and doffing on bilateral hands. She is not moving any of her extremities due to history of CVA  and needing total care. She takes Clopidogrel 75 mg daily and Rosuvastatin 5 mg daily for history of CVA. SBPs ranging from 120 to 149. She takes Amlodipine 10 mg daily, Hydralazine 25 mg every 8 hours and Metoprolol tartrate 25 mg BID for hypertension.                                                                                                                                                                Past Medical History:  Diagnosis Date  . Diabetes mellitus without complication (HCC)   . Essential (primary) hypertension   .  History of falling   . MI (myocardial infarction) (HCC)   . Stroke [facility])    Past Surgical History:  Procedure Laterality Date  . ABDOMINAL HYSTERECTOMY    . APPENDECTOMY    . BACK SURGERY    . IR GASTROSTOMY TUBE MOD SED  03/31/2020    Allergies  Allergen Reactions  . Allopurinol Shortness Of Breath  . Tetracycline Anaphylaxis  . Amoxicillin-Pot Clavulanate Diarrhea  . Atorvastatin Other (See Comments)    Unknown reaction - reported by [facility], [place] clinic 05/10/2012  . Azithromycin Other (See Comments)    Caused blisters inside and out  . Codeine Other (See Comments)    Altered mental status  . Lisinopril Other (See Comments)    Unknown reaction - reported by [facility], [place] clinic 12/23/2008 02/20/2020 she believes "reaction" was NP cough. No PMH of angioedema  . Sulfa Antibiotics Diarrhea  Outpatient Encounter Medications as of 08/17/2020  Medication Sig  . amLODipine (NORVASC) 10 MG tablet Place 10 mg into feeding tube daily.  . bisacodyl (DULCOLAX) 10 MG suppository Place 10 mg rectally as needed for moderate constipation. If MOM doesn't work  . clopidogrel (PLAVIX) 75 MG tablet Place 1 tablet (75 mg total) into feeding tube daily.  . collagenase (SANTYL) ointment Apply 1 application topically daily.  . hydrALAZINE (APRESOLINE) 25 MG tablet Take 25 mg by mouth every 8 (eight) hours.  . insulin detemir (LEVEMIR) 100 UNIT/ML injection Inject 0.1 mLs (10 Units total) into the skin 2 (two) times daily.  . metoprolol tartrate (LOPRESSOR) 25 MG tablet Place 25 mg into feeding tube 2 (two) times daily.  . Nutritional Supplements (FEEDING SUPPLEMENT, GLUCERNA 1.2 CAL,) LIQD Place 240 mLs into feeding tube in the morning, at noon, in the evening, and at bedtime. Give bolus via tube, resume continuous feedings when pump arrives  . pantoprazole sodium (PROTONIX) 40 mg/20 mL PACK Place 20 mLs (40 mg total) into feeding tube daily.  . polyethylene glycol (MIRALAX / GLYCOLAX) 17 g packet  Place 17 g into feeding tube daily as needed for mild constipation.  . rosuvastatin (CRESTOR) 5 MG tablet Place 1 tablet (5 mg total) into feeding tube daily.  . [DISCONTINUED] Amino Acids-Protein Hydrolys (FEEDING SUPPLEMENT, PRO-STAT SUGAR FREE 64,) LIQD Place 30 mLs into feeding tube daily.  . [DISCONTINUED] Water For Irrigation, Sterile (FREE WATER) SOLN Place 200 mLs into feeding tube every 4 (four) hours.   No facility-administered encounter medications on file as of 08/17/2020.    Review of Systems  Unable to obtain due to aphasia.    Immunization History  Administered Date(s) Administered  . Influenza, High Dose Seasonal PF 05/04/2016  . Influenza-Unspecified 06/17/2020  . PFIZER SARS-COV-2 Vaccination 10/03/2019, 10/24/2019  . Pneumococcal Polysaccharide-23 08/29/2003, 06/30/2009  . Zoster 01/29/2006   Pertinent  Health Maintenance Due  Topic Date Due  . FOOT EXAM  Never done  . OPHTHALMOLOGY EXAM  Never done  . COLONOSCOPY (Pts 45-95yrs Insurance coverage will need to be confirmed)  Never done  . MAMMOGRAM  Never done  . DEXA SCAN  Never done  . PNA vac Low Risk Adult (1 of 2 - PCV13) 05/04/2011  . HEMOGLOBIN A1C  08/25/2020  . INFLUENZA VACCINE  Completed    Vitals:   08/17/20 1004  BP: (!) 120/55  Pulse: 62  Resp: 16  Temp: 97.9 F (36.6 C)  TempSrc: Oral  Weight: 111 lb (50.3 kg)  Height: 5\' 1"  (1.549 m)   Body mass index is 20.97 kg/m.  Physical Exam  GENERAL APPEARANCE: Well nourished. In no acute distress.  SKIN:  Has left heel and sacral pressure ulcer stage 3 with dressing MOUTH and THROAT: Lips are without lesions. Oral mucosa is moist and without lesions.  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 PSYCHIATRIC:  Affect and behavior are appropriate  Labs reviewed: Recent Labs     04/04/20 0315 04/05/20 0352 04/05/20 0942 04/06/20 0408 04/07/20 0555 04/09/20 0819 04/10/20 0251 04/22/20 0907 04/23/20 0314 04/26/20 0101  NA 128* 128*  --  137   < > 146*   < > 142 141 138  K 4.7 4.2  --  3.6   < > 3.7   < > 4.3 3.9 3.8  CL 93* 94*  --  102   < > 111   < > 107 107 107  CO2 20* 19*  --  21*   < > 24   < > 27 25 21*  GLUCOSE 219* 137*  --  115*   < > 178*   < > 176* 134* 181*  BUN 63* 77*  --  65*   < > 39*   < > 44* 36* 22  CREATININE 1.42* 1.87*  --  1.57*   < > 0.99   < > 0.64 0.59 0.56  CALCIUM 9.7 9.2  --  9.1   < > 9.2   < > 8.9 8.8* 8.7*  MG 2.0 2.1  --  2.1  --   --   --   --   --   --   PHOS 3.7  --  4.5 4.1  --  3.5  --   --   --   --    < > = values in this interval not displayed.   Recent Labs    03/20/20 0216 03/21/20 0415 03/22/20 0306 04/04/20 0315  AST 48* 29 60*  --   ALT 95* 91* 121*  --   ALKPHOS 57 56 54  --   BILITOT 0.6 0.5 0.6  --   PROT 6.8 6.8 6.8  --   ALBUMIN 2.6* 2.6* 2.6* 2.5*   Recent Labs    04/19/20 0155 04/20/20 0256 04/23/20 0314 04/26/20 0101  WBC 10.4 12.1* 9.1 9.6  NEUTROABS 6.9 8.5* 4.9  --   HGB 8.4* 8.6* 7.4* 8.3*  HCT 26.9* 28.9* 24.7* 26.1*  MCV 97.5 98.0 98.4 97.0  PLT 424* 385 312 325   Lab Results  Component Value Date   TSH 2.232 03/08/2020   Lab Results  Component Value Date   HGBA1C 7 02/24/2020   Lab Results  Component Value Date   CHOL 223 (H) 02/16/2020   HDL 49 02/16/2020   LDLCALC 143 (H) 02/16/2020   TRIG 157 (H) 02/16/2020   CHOLHDL 4.6 02/16/2020    Assessment/Plan  1. Essential (primary) hypertension -  BPs stable, continue metoprolol, hydralazine and amlodipine -   Monitor BPs  2. DM (diabetes mellitus), type 2 with peripheral vascular complications (HCC) Lab Results  Component Value Date   HGBA1C 7 02/24/2020   -Continue Levemir 10 units twice a day and CBG checks  3. History of CVA with residual deficit -   Stable, continue clopidogrel and rosuvastatin -     Continue supportive care  4. Dysphagia, unspecified type -   Continue Glucerna 1.2 at 55 mL/hour via PEG tube -   Aspiration precautions     Family/ staff Communication: Discussed plan of care with resident and charge nurse.  Labs/tests ordered: None  Goals of care:   Long-term care  Kenard Gower, DNP, MSN, FNP-BC [facility] [phone] (Monday-Friday 8:00 a.m. - 5:00 p.m.) [phone] (after hours)

## 2020-09-09 ENCOUNTER — Encounter: Payer: Self-pay | Admitting: Adult Health

## 2020-09-09 ENCOUNTER — Non-Acute Institutional Stay (SKILLED_NURSING_FACILITY): Payer: Self-pay | Admitting: Adult Health

## 2020-09-09 DIAGNOSIS — E1151 Type 2 diabetes mellitus with diabetic peripheral angiopathy without gangrene: Secondary | ICD-10-CM

## 2020-09-09 DIAGNOSIS — R1310 Dysphagia, unspecified: Secondary | ICD-10-CM

## 2020-09-09 DIAGNOSIS — I10 Essential (primary) hypertension: Secondary | ICD-10-CM

## 2020-09-09 DIAGNOSIS — I6930 Unspecified sequelae of cerebral infarction: Secondary | ICD-10-CM

## 2020-09-09 NOTE — Progress Notes (Signed)
Location:  [facility] Room Number: 227 Place of Service:  SNF (31) Provider:  Kenard Gower, DNP, FNP-BC  Patient Care Team: Pecola Lawless, MD as PCP - General (Internal Medicine) Medina-Vargas, Margit Banda, NP as Nurse Practitioner (Internal Medicine) Rehab, [facility])  Extended Emergency Contact Information Primary Emergency Contact: taisley, mordan Mobile Phone: [phone] Relation: Son Secondary Emergency Contact: Magally, Vahle Mobile Phone: [phone] Relation: Daughter  Code Status:  FULL CODE  Goals of care: Advanced Directive information Advanced Directives 09/09/2020  Does Patient Have a Medical Advance Directive? Yes  Would patient like information on creating a medical advance directive? -     Chief Complaint  Patient presents with  . Medical Management of Chronic Issues    Routine visit     HPI:  Pt is a 75 y.o. female seen today for medical management of chronic diseases.  She is a long-term care resident of [facility].  She has a PMH of MI, dyslipidemia and essential hypertension. She was seen in the room today. 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 CBGs ranging from 154 to 363. She takes Levemir 10 units BID for diabetes mellitus. SBPs ranging from 130 to 154, with outlier 164. She takes Amlodipine 10 mg daily, Hydralazine 25 mg every 8 hours and Metoprolol tartrate 25 mg BID for hypertension.    Past Medical History:  Diagnosis Date  . Diabetes mellitus without complication (HCC)   . Essential (primary) hypertension   . History of falling   . MI (myocardial infarction) (HCC)   . Stroke [facility])    Past Surgical History:  Procedure Laterality Date  . ABDOMINAL HYSTERECTOMY    . APPENDECTOMY    . BACK SURGERY    . IR GASTROSTOMY TUBE MOD SED  03/31/2020    Allergies  Allergen Reactions  . Allopurinol Shortness Of Breath  . Tetracycline  Anaphylaxis  . Amoxicillin-Pot Clavulanate Diarrhea  . Atorvastatin Other (See Comments)    Unknown reaction - reported by [facility], [place] clinic 05/10/2012  . Azithromycin Other (See Comments)    Caused blisters inside and out  . Codeine Other (See Comments)    Altered mental status  . Lisinopril Other (See Comments)    Unknown reaction - reported by [facility], [place] clinic 12/23/2008 02/20/2020 she believes "reaction" was NP cough. No PMH of angioedema  . Sulfa Antibiotics Diarrhea    Outpatient Encounter Medications as of 09/09/2020  Medication Sig  . amLODipine (NORVASC) 10 MG tablet Place 10 mg into feeding tube daily.  . bisacodyl (DULCOLAX) 10 MG suppository Place 10 mg rectally as needed for moderate constipation. If MOM doesn't work  . clopidogrel (PLAVIX) 75 MG tablet Place 1 tablet (75 mg total) into feeding tube daily.  . collagenase (SANTYL) ointment Apply 1 application topically daily.  . hydrALAZINE (APRESOLINE) 25 MG tablet Take 25 mg by mouth every 8 (eight) hours.  . insulin detemir (LEVEMIR) 100 UNIT/ML injection Inject 0.1 mLs (10 Units total) into the skin 2 (two) times daily.  . metoprolol tartrate (LOPRESSOR) 25 MG tablet Place 25 mg into feeding tube 2 (two) times daily.  . Nutritional Supplements (FEEDING SUPPLEMENT, GLUCERNA 1.2 CAL,) LIQD Place 240 mLs into feeding tube in the morning, at noon, in the evening, and at bedtime. Give bolus via tube, resume continuous feedings when pump arrives  . pantoprazole sodium (PROTONIX) 40 mg/20 mL PACK Place 20 mLs (40 mg total) into feeding tube  daily.  . polyethylene glycol (MIRALAX / GLYCOLAX) 17 g packet Place 17 g into feeding tube daily as needed for mild constipation.  . rosuvastatin (CRESTOR) 5 MG tablet Place 1 tablet (5 mg total) into feeding tube daily.   No facility-administered encounter medications on file as of 09/09/2020.    Review of Systems  Unable to obtain due to aphasia    Immunization History   Administered Date(s) Administered  . Influenza, High Dose Seasonal PF 05/04/2016  . Influenza-Unspecified 06/17/2020  . PFIZER SARS-COV-2 Vaccination 10/03/2019, 10/24/2019  . Pneumococcal Polysaccharide-23 08/29/2003, 06/30/2009  . Zoster 01/29/2006   Pertinent  Health Maintenance Due  Topic Date Due  . FOOT EXAM  Never done  . OPHTHALMOLOGY EXAM  Never done  . COLONOSCOPY (Pts 45-76yrs Insurance coverage will need to be confirmed)  Never done  . MAMMOGRAM  Never done  . DEXA SCAN  Never done  . PNA vac Low Risk Adult (1 of 2 - PCV13) 05/04/2011  . HEMOGLOBIN A1C  08/25/2020  . INFLUENZA VACCINE  Completed    Vitals:   09/09/20 0950  BP: 136/67  Pulse: 80  Resp: 17  Temp: 97.9 F (36.6 C)  Weight: 111 lb (50.3 kg)  Height: 5\' 1"  (1.549 m)   Body mass index is 20.97 kg/m.  Physical Exam  GENERAL APPEARANCE: Well nourished. In no acute distress. Normal body habitus SKIN:  Sacral and left heel wound with dressing  MOUTH and THROAT: Lips are without lesions. Oral mucosa is moist and without lesions.  RESPIRATORY: Breathing is even & unlabored, BS CTAB CARDIAC: RRR, no murmur,no extra heart sounds, no edema GI: Has peg tube intact GU:  Has foley catheter draining to urine bag NEUROLOGICAL: There is no tremor. Aphasic. Unable to move X 4 extremities. PSYCHIATRIC:  Affect and behavior are appropriate  Labs reviewed: Recent Labs    04/04/20 0315 04/05/20 0352 04/05/20 0942 04/06/20 0408 04/07/20 0555 04/09/20 0819 04/10/20 0251 04/22/20 0907 04/23/20 0314 04/26/20 0101  NA 128* 128*  --  137   < > 146*   < > 142 141 138  K 4.7 4.2  --  3.6   < > 3.7   < > 4.3 3.9 3.8  CL 93* 94*  --  102   < > 111   < > 107 107 107  CO2 20* 19*  --  21*   < > 24   < > 27 25 21*  GLUCOSE 219* 137*  --  115*   < > 178*   < > 176* 134* 181*  BUN 63* 77*  --  65*   < > 39*   < > 44* 36* 22  CREATININE 1.42* 1.87*  --  1.57*   < > 0.99   < > 0.64 0.59 0.56  CALCIUM 9.7 9.2  --   9.1   < > 9.2   < > 8.9 8.8* 8.7*  MG 2.0 2.1  --  2.1  --   --   --   --   --   --   PHOS 3.7  --  4.5 4.1  --  3.5  --   --   --   --    < > = values in this interval not displayed.   Recent Labs    03/20/20 0216 03/21/20 0415 03/22/20 0306 04/04/20 0315  AST 48* 29 60*  --   ALT 95* 91* 121*  --   ALKPHOS 57 56 54  --  BILITOT 0.6 0.5 0.6  --   PROT 6.8 6.8 6.8  --   ALBUMIN 2.6* 2.6* 2.6* 2.5*   Recent Labs    04/19/20 0155 04/20/20 0256 04/23/20 0314 04/26/20 0101  WBC 10.4 12.1* 9.1 9.6  NEUTROABS 6.9 8.5* 4.9  --   HGB 8.4* 8.6* 7.4* 8.3*  HCT 26.9* 28.9* 24.7* 26.1*  MCV 97.5 98.0 98.4 97.0  PLT 424* 385 312 325   Lab Results  Component Value Date   TSH 2.232 03/08/2020   Lab Results  Component Value Date   HGBA1C 7 02/24/2020   Lab Results  Component Value Date   CHOL 223 (H) 02/16/2020   HDL 49 02/16/2020   LDLCALC 143 (H) 02/16/2020   TRIG 157 (H) 02/16/2020   CHOLHDL 4.6 02/16/2020     Assessment/Plan  1. History of CVA with residual deficit -  Stable, continue Clopidogrel and Rosuvastatin  2. DM (diabetes mellitus), type 2 with peripheral vascular complications [facility]) Lab Results  Component Value Date   HGBA1C 7 02/24/2020   -  Will increase Levemir from 10 units BID to 12 units BID -  Continue CBG checks  3. Essential (primary) hypertension -  BPs stable, continue Amlodipine, Hydralazine and Metoprolol tartrate  4. Dysphagia, unspecified type -  Continue tube feedings of Glucerna 1.2 -  Aspiration precautions     Family/ staff Communication: Discussed plan of care with charge nurse.  Labs/tests ordered:  None  Goals of care:  Long-term care/Palliative care    Kenard Gower, DNP, MSN, FNP-BC [facility] and Adult Medicine [phone] (Monday-Friday 8:00 a.m. - 5:00 p.m.) [phone] (after hours)

## 2020-09-14 ENCOUNTER — Other Ambulatory Visit: Payer: Self-pay

## 2020-09-14 ENCOUNTER — Non-Acute Institutional Stay: Payer: Self-pay | Admitting: Hospice

## 2020-09-14 DIAGNOSIS — Z515 Encounter for palliative care: Secondary | ICD-10-CM

## 2020-09-14 DIAGNOSIS — R1312 Dysphagia, oropharyngeal phase: Secondary | ICD-10-CM

## 2020-09-14 NOTE — Progress Notes (Signed)
PATIENT NAME: Tasha [name] [address] [place] [address] [phone] (home)  DOB: 1946/08/20 MRN: [id number]  PRIMARY CARE PROVIDER:    Hendricks Limes, MD,  [place] [address] [phone]  REFERRING PROVIDER:  Alben Spittle NP  RESPONSIBLE PARTY:   Extended Emergency Contact Information Primary Emergency Contact: jewelia, bocchino Mobile Phone: [phone] Relation: Son Secondary Emergency Contact: Laiken, Sandy Mobile Phone: [phone] Relation: Daughter  I met face to face with patient and family in home/facility.   RECOMMENDATIONS/PLAN:   Advance care planning  Visit to build trust and follow-up on palliative care.  CODE STATUS: CODE STATUS reviewed.  Patient is a full code   [name] explained that a full CODE STATUS was patient's wish.  Extensive discussion ramifications and implications of patient remaining a full code, in the circumstance of her advanced worsening dementia.  [name] said he would discuss with his sister for family to reconsider her CODE STATUS; he would discuss with his sister. Therapeutic listening and validation provided.  GOALS OF CARE: Goals of care include to maximize quality of life and symptom management.  Follow up Palliative Care Visit: Palliative care will continue to follow for goals of care clarification and symptom management.  Follow-up in 2 months/as needed   Functional decline/Symptom Management:  Incremental decline since last visit in line with Dementia disease trajectory; patient is now nonverbal; son reports she no longer recognizes him.  FLACC 0 FAST 7c.  Chronic severe dysphagia  related to history of MI/stroke and Dementia.Continue PEG tube feeding with Glucerna 1.2 infusing via enteral feeding pump continously @ 3ml/hr and water flushes 21ml q4hr.  Wound nurse following for sacral wound.  Patient is bedbound, nonambulatory, total care; She remains incontinent of B&B.   Sacral wound followed by  facility wound nurse. Nursing with no concerns at this time. Encouraged ongoing care.  Palliative will continue to monitor for symptom management/decline and make recommendations as needed.   Family /Caregiver/Community Supports: [name] is involved in patient's care.  Strong family support network identified.  I spent 55 minutes providing this consultation; time iincludes time spent with patient/family, chart review, provider coordination,  and documentation. More than 50% of the time in this consultation was spent on coordinating communication  CHIEF COMPLAIN/HISTORY OF PRESENT ILLNESS:  Tasha [name] is a 75 y.o. female with multiple medical problems including hx of Dementia, MI, HTN Stroke, Dysphagia, Type 2 DM. Palliative Care was asked to follow this patient by consultation request of Alben Spittle NP to help address advance care planning and goals of care.  CODE STATUS: Full  PPS: 30%  HOSPICE ELIGIBILITY/DIAGNOSIS: Yes. Family is not yet ready.  PAST MEDICAL HISTORY:  Past Medical History:  Diagnosis Date  . Diabetes mellitus without complication ([place])   . Essential (primary) hypertension   . History of falling   . MI (myocardial infarction) ([place])   . Stroke [facility])     SOCIAL HX:  Social History   Tobacco Use  . Smoking status: Never Smoker  . Smokeless tobacco: Never Used  Substance Use Topics  . Alcohol use: Yes    Comment: occasionally   FAMILY HX:  Family History  Adopted: Yes  Family history unknown: Yes     ALLERGIES:  Allergies  Allergen Reactions  . Allopurinol Shortness Of Breath  . Tetracycline Anaphylaxis  . Amoxicillin-Pot Clavulanate Diarrhea  . Atorvastatin Other (See Comments)    Unknown reaction - reported by [facility], [place] clinic 05/10/2012  . Azithromycin Other (  See Comments)    Caused blisters inside and out  . Codeine Other (See Comments)    Altered mental status  . Lisinopril Other (See Comments)    Unknown reaction - reported by  [facility], [place] clinic 12/23/2008 02/20/2020 she believes "reaction" was NP cough. No PMH of angioedema  . Sulfa Antibiotics Diarrhea     PERTINENT MEDICATIONS:  Outpatient Encounter Medications as of 09/14/2020  Medication Sig  . amLODipine (NORVASC) 10 MG tablet Place 10 mg into feeding tube daily.  . bisacodyl (DULCOLAX) 10 MG suppository Place 10 mg rectally as needed for moderate constipation. If MOM doesn't work  . clopidogrel (PLAVIX) 75 MG tablet Place 1 tablet (75 mg total) into feeding tube daily.  . collagenase (SANTYL) ointment Apply 1 application topically daily.  . hydrALAZINE (APRESOLINE) 25 MG tablet Take 25 mg by mouth every 8 (eight) hours.  . insulin detemir (LEVEMIR) 100 UNIT/ML injection Inject 0.1 mLs (10 Units total) into the skin 2 (two) times daily.  . metoprolol tartrate (LOPRESSOR) 25 MG tablet Place 25 mg into feeding tube 2 (two) times daily.  . Nutritional Supplements (FEEDING SUPPLEMENT, GLUCERNA 1.2 CAL,) LIQD Place 240 mLs into feeding tube in the morning, at noon, in the evening, and at bedtime. Give bolus via tube, resume continuous feedings when pump arrives  . pantoprazole sodium (PROTONIX) 40 mg/20 mL PACK Place 20 mLs (40 mg total) into feeding tube daily.  . polyethylene glycol (MIRALAX / GLYCOLAX) 17 g packet Place 17 g into feeding tube daily as needed for mild constipation.  . rosuvastatin (CRESTOR) 5 MG tablet Place 1 tablet (5 mg total) into feeding tube daily.   No facility-administered encounter medications on file as of 09/14/2020.    PHYSICAL EXAM/ROS:  General: NAD, cooperative; nonverbal Cardiovascular: regular rate and rhythm, no chest pain reported Pulmonary: no cough, no shortness of breath, clear ant/post fields, normal respiratory effort Abdomen: soft, non tender, positive bowel sounds in all quadrants; PEG tube in place, patent GU: denies dysuria, no suprapubic tenderness Extremities: no edema, Prevalon boots to bilateral lower  extremities Neurological: Weakness but otherwise non focal; no longer able to recognize family members Note: Portions of this note were generated with [employer]. Dictation errors may occur despite best attempts at proofreading.  Teodoro Spray, NP

## 2020-09-15 LAB — BASIC METABOLIC PANEL
BUN: 26 — AB (ref 4–21)
CO2: 24 — AB (ref 13–22)
Chloride: 102 (ref 99–108)
Creatinine: 0.4 — AB (ref 0.5–1.1)
Glucose: 218
Potassium: 3.8 (ref 3.4–5.3)
Sodium: 138 (ref 137–147)

## 2020-09-15 LAB — CBC AND DIFFERENTIAL
HCT: 30 — AB (ref 36–46)
Hemoglobin: 9.9 — AB (ref 12.0–16.0)
Neutrophils Absolute: 8.7
Platelets: 305 (ref 150–399)
WBC: 11.8

## 2020-09-15 LAB — COMPREHENSIVE METABOLIC PANEL
Albumin: 3.3 — AB (ref 3.5–5.0)
Calcium: 9.6 (ref 8.7–10.7)
GFR calc Af Amer: 90
GFR calc non Af Amer: 90
Globulin: 2.5

## 2020-09-15 LAB — CBC: RBC: 3.58 — AB (ref 3.87–5.11)

## 2020-09-15 LAB — LIPID PANEL
Cholesterol: 118 (ref 0–200)
HDL: 36 (ref 35–70)
LDL Cholesterol: 29
LDl/HDL Ratio: 3.3
Triglycerides: 265 — AB (ref 40–160)

## 2020-09-15 LAB — HEPATIC FUNCTION PANEL
ALT: 13 (ref 7–35)
AST: 10 — AB (ref 13–35)
Alkaline Phosphatase: 82 (ref 25–125)
Bilirubin, Total: 0.3

## 2020-09-15 LAB — HEMOGLOBIN A1C: Hemoglobin A1C: 7.5

## 2020-09-16 ENCOUNTER — Encounter: Payer: Self-pay | Admitting: Adult Health

## 2020-09-16 ENCOUNTER — Non-Acute Institutional Stay (SKILLED_NURSING_FACILITY): Payer: Self-pay | Admitting: Adult Health

## 2020-09-16 DIAGNOSIS — D509 Iron deficiency anemia, unspecified: Secondary | ICD-10-CM

## 2020-09-16 DIAGNOSIS — L03319 Cellulitis of trunk, unspecified: Secondary | ICD-10-CM

## 2020-09-16 NOTE — Progress Notes (Signed)
Location:  [facility] Room Number: 227-A Place of Service:  SNF (31) Provider:  Kenard Gower, DNP, FNP-BC  Patient Care Team: Pecola Lawless, MD as PCP - General (Internal Medicine) Medina-Vargas, Margit Banda, NP as Nurse Practitioner (Internal Medicine) Rehab, [facility]Sk[facility] Facility)  Extended Emergency Contact Information Primary Emergency Contact: adah, stoneberg Mobile Phone: [phone] Relation: Son Secondary Emergency Contact: Natilee, Gauer Mobile Phone: [phone] Relation: Daughter  Code Status:  FULL CODE  Goals of care: Advanced Directive information Advanced Directives 09/09/2020  Does Patient Have a Medical Advance Directive? Yes  Would patient like information on creating a medical advance directive? -     Chief Complaint  Patient presents with  . Acute Visit    Patient seen for rash on her back.     HPI:  Tasha [name] is a 75 y.o. female seen today acutely for erythematous rashes on her back. She is a long-term care resident of [facility]. She has a PMH of MI, dyslipidemia and essential hypertension. Noted rashes are wet with clear drainage. She has history of CVA and now a total care. She has dysphagia and has a peg tube for nutrition. No reported fever. Latest lab done 09/15/20 showed wbc 11.8, elevated from 9.6, hgb 9.9 (improved from 8.3).    Past Medical History:  Diagnosis Date  . Diabetes mellitus without complication (HCC)   . Essential (primary) hypertension   . History of falling   . MI (myocardial infarction) (HCC)   . Stroke [facility])    Past Surgical History:  Procedure Laterality Date  . ABDOMINAL HYSTERECTOMY    . APPENDECTOMY    . BACK SURGERY    . IR GASTROSTOMY TUBE MOD SED  03/31/2020    Allergies  Allergen Reactions  . Allopurinol Shortness Of Breath  . Tetracycline Anaphylaxis  . Amoxicillin-Pot Clavulanate Diarrhea  . Atorvastatin Other (See Comments)     Unknown reaction - reported by [facility], [place] clinic 05/10/2012  . Azithromycin Other (See Comments)    Caused blisters inside and out  . Codeine Other (See Comments)    Altered mental status  . Lisinopril Other (See Comments)    Unknown reaction - reported by [facility], [place] clinic 12/23/2008 02/20/2020 she believes "reaction" was NP cough. No PMH of angioedema  . Sulfa Antibiotics Diarrhea    Outpatient Encounter Medications as of 09/16/2020  Medication Sig  . bisacodyl (DULCOLAX) 10 MG suppository Place 10 mg rectally as needed for moderate constipation. If MOM doesn't work  . amLODipine (NORVASC) 10 MG tablet Place 10 mg into feeding tube daily.  . clopidogrel (PLAVIX) 75 MG tablet Place 1 tablet (75 mg total) into feeding tube daily.  . collagenase (SANTYL) ointment Apply 1 application topically daily.  . hydrALAZINE (APRESOLINE) 25 MG tablet Take 25 mg by mouth every 8 (eight) hours.  . insulin detemir (LEVEMIR) 100 UNIT/ML injection Inject 0.1 mLs (10 Units total) into the skin 2 (two) times daily.  . metoprolol tartrate (LOPRESSOR) 25 MG tablet Place 25 mg into feeding tube 2 (two) times daily.  . Nutritional Supplements (FEEDING SUPPLEMENT, GLUCERNA 1.2 CAL,) LIQD Place 240 mLs into feeding tube in the morning, at noon, in the evening, and at bedtime. Give bolus via tube, resume continuous feedings when pump arrives  . pantoprazole sodium (PROTONIX) 40 mg/20 mL PACK Place 20 mLs (40 mg total) into feeding tube daily.  . polyethylene glycol (MIRALAX / GLYCOLAX) 17 g packet Place 17 g into  feeding tube daily as needed for mild constipation.  . rosuvastatin (CRESTOR) 5 MG tablet Place 1 tablet (5 mg total) into feeding tube daily.   No facility-administered encounter medications on file as of 09/16/2020.    Review of Systems  Unable to obtain due to aphasia    Immunization History  Administered Date(s) Administered  . Influenza, High Dose Seasonal PF 05/04/2016  .  Influenza-Unspecified 06/17/2020  . PFIZER(Purple Top)SARS-COV-2 Vaccination 10/03/2019, 10/24/2019  . Pneumococcal Polysaccharide-23 08/29/2003, 06/30/2009  . Zoster 01/29/2006   Pertinent  Health Maintenance Due  Topic Date Due  . FOOT EXAM  Never done  . OPHTHALMOLOGY EXAM  Never done  . COLONOSCOPY (Pts 45-31yrs Insurance coverage will need to be confirmed)  Never done  . MAMMOGRAM  Never done  . DEXA SCAN  Never done  . PNA vac Low Risk Adult (1 of 2 - PCV13) 05/04/2011  . HEMOGLOBIN A1C  08/25/2020  . INFLUENZA VACCINE  Completed    Vitals:   09/16/20 1452  Weight: 107 lb (48.5 kg)  Height: 5\' 1"  (1.549 m)   Body mass index is 20.22 kg/m.  Physical Exam  GENERAL APPEARANCE: Well nourished. In no acute distress.  SKIN:  See HPI MOUTH and THROAT: Lips are without lesions. Oral mucosa is moist and without lesions.  RESPIRATORY: Breathing is even & unlabored, BS CTAB CARDIAC: RRR, no murmur,no extra heart sounds, no edema GI: Abdomen soft, normal BS, no masses, no tenderness NEUROLOGICAL: There is no tremor. Aphasic.  PSYCHIATRIC:  Affect and behavior are appropriate  Labs reviewed: Recent Labs    04/04/20 0315 04/05/20 0352 04/05/20 0942 04/06/20 0408 04/07/20 0555 04/09/20 0819 04/10/20 0251 04/22/20 0907 04/23/20 0314 04/26/20 0101  NA 128* 128*  --  137   < > 146*   < > 142 141 138  K 4.7 4.2  --  3.6   < > 3.7   < > 4.3 3.9 3.8  CL 93* 94*  --  102   < > 111   < > 107 107 107  CO2 20* 19*  --  21*   < > 24   < > 27 25 21*  GLUCOSE 219* 137*  --  115*   < > 178*   < > 176* 134* 181*  BUN 63* 77*  --  65*   < > 39*   < > 44* 36* 22  CREATININE 1.42* 1.87*  --  1.57*   < > 0.99   < > 0.64 0.59 0.56  CALCIUM 9.7 9.2  --  9.1   < > 9.2   < > 8.9 8.8* 8.7*  MG 2.0 2.1  --  2.1  --   --   --   --   --   --   PHOS 3.7  --  4.5 4.1  --  3.5  --   --   --   --    < > = values in this interval not displayed.   Recent Labs    03/20/20 0216 03/21/20 0415  03/22/20 0306 04/04/20 0315  AST 48* 29 60*  --   ALT 95* 91* 121*  --   ALKPHOS 57 56 54  --   BILITOT 0.6 0.5 0.6  --   PROT 6.8 6.8 6.8  --   ALBUMIN 2.6* 2.6* 2.6* 2.5*   Recent Labs    04/19/20 0155 04/20/20 0256 04/23/20 0314 04/26/20 0101  WBC 10.4 12.1* 9.1 9.6  NEUTROABS  6.9 8.5* 4.9  --   HGB 8.4* 8.6* 7.4* 8.3*  HCT 26.9* 28.9* 24.7* 26.1*  MCV 97.5 98.0 98.4 97.0  PLT 424* 385 312 325   Lab Results  Component Value Date   TSH 2.232 03/08/2020   Lab Results  Component Value Date   HGBA1C 7 02/24/2020   Lab Results  Component Value Date   CHOL 223 (H) 02/16/2020   HDL 49 02/16/2020   LDLCALC 143 (H) 02/16/2020   TRIG 157 (H) 02/16/2020   CHOLHDL 4.6 02/16/2020    Assessment/Plan  1. Cellulitis of trunk, unspecified site of trunk -  Will start on Doxycycline 100 mg BID 1 tab BID via peg tube X 7 days and Florastor 250 mg 1 capsule BID via peg tube X 10 days -  Keep skin clean and dry  2.  Anemia Lab Results  Component Value Date   HGB 8.3 (L) 04/26/2020   -  09/15/20 hgb 9.9, improved     Family/ staff Communication:  Discussed plan of care with charge nurse.  Labs/tests ordered:  None  Goals of care:   Long-term care   Kenard Gower, DNP, MSN, FNP-BC [facility] [phone] (Monday-Friday 8:00 a.m. - 5:00 p.m.) [phone] (after hours)

## 2020-09-19 ENCOUNTER — Telehealth: Payer: Self-pay | Admitting: Adult Health

## 2020-09-19 NOTE — Telephone Encounter (Addendum)
Nurse called from [facility] to report that this resident had a UA C and S return with 100,000 colonies of ESBL. The nurse does not know the reason the specimen was collected.  The patient currently is on doxycycline for a rash on their back. The pt is not able to talk and on tube feeding. Pt has a chronic indwelling foley. There are no symptoms of UTI and no fever. Vitals are stable per nurse.  Sensitivity report reviewed and further complicating her case is that she has numerous allergies. She has a documented reaction of anaphylaxis with tetracycline per epic but is taking doxy without s/e.  This resident may have asymptomatic bacteruria. Will forward to her regular provider for case review. Nurse instructed to place patient on contact isolation and handle urine with care.

## 2020-09-21 ENCOUNTER — Non-Acute Institutional Stay (SKILLED_NURSING_FACILITY): Payer: Self-pay | Admitting: Adult Health

## 2020-09-21 ENCOUNTER — Encounter: Payer: Self-pay | Admitting: Adult Health

## 2020-09-21 DIAGNOSIS — L03319 Cellulitis of trunk, unspecified: Secondary | ICD-10-CM

## 2020-09-21 DIAGNOSIS — Z1612 Extended spectrum beta lactamase (ESBL) resistance: Secondary | ICD-10-CM

## 2020-09-21 DIAGNOSIS — B9629 Other Escherichia coli [E. coli] as the cause of diseases classified elsewhere: Secondary | ICD-10-CM

## 2020-09-21 DIAGNOSIS — N390 Urinary tract infection, site not specified: Secondary | ICD-10-CM

## 2020-09-21 NOTE — Progress Notes (Signed)
Location:  [facility] Room Number: 221 B Place of Service:  SNF (31) Provider:  Kenard Gower, DNP, FNP-BC  Patient Care Team: Pecola Lawless, MD as PCP - General (Internal Medicine) Medina-Vargas, Margit Banda, NP as Nurse Practitioner (Internal Medicine) Rehab, [facility]Sk[facility] Nursing Facility)  Extended Emergency Contact Information Primary Emergency Contact: dama, hedgepeth Mobile Phone: [phone] Relation: Son Secondary Emergency Contact: Korianna, Washer Mobile Phone: [phone] Relation: Daughter  Code Status:   Full Code  Goals of care: Advanced Directive information Advanced Directives 09/09/2020  Does Patient Have a Medical Advance Directive? Yes  Would patient like information on creating a medical advance directive? -     Chief Complaint  Patient presents with  . Acute Visit    Positive  ESBL on urine, follow up cellulitis    HPI:  Pt is a 75 y.o. female seen today for urine culture with positive ESBL and follow up cellulitis.  She has a PMH MI, dyslipidemia and essential hypertension. She has a chronic foley catheter. No noted hematuria. She was recently started on Doxycycline for cellulitis on back. Her rashes on her back are improving, drying. Tetracycline was one of her 8 allergies but no side effects were noted from Doxycycline. There was no reported fever nor SOB.   Past Medical History:  Diagnosis Date  . Diabetes mellitus without complication (HCC)   . Essential (primary) hypertension   . History of falling   . MI (myocardial infarction) (HCC)   . Stroke [facility])    Past Surgical History:  Procedure Laterality Date  . ABDOMINAL HYSTERECTOMY    . APPENDECTOMY    . BACK SURGERY    . IR GASTROSTOMY TUBE MOD SED  03/31/2020    Allergies  Allergen Reactions  . Allopurinol Shortness Of Breath  . Tetracycline Anaphylaxis  . Amoxicillin-Pot Clavulanate Diarrhea  . Atorvastatin Other (See Comments)    Unknown  reaction - reported by [facility], [place] clinic 05/10/2012  . Azithromycin Other (See Comments)    Caused blisters inside and out  . Codeine Other (See Comments)    Altered mental status  . Lisinopril Other (See Comments)    Unknown reaction - reported by [facility], [place] clinic 12/23/2008 02/20/2020 she believes "reaction" was NP cough. No PMH of angioedema  . Sulfa Antibiotics Diarrhea    Outpatient Encounter Medications as of 09/21/2020  Medication Sig  . amLODipine (NORVASC) 10 MG tablet Place 10 mg into feeding tube daily.  . bisacodyl (DULCOLAX) 10 MG suppository Place 10 mg rectally as needed for moderate constipation. If MOM doesn't work  . clopidogrel (PLAVIX) 75 MG tablet Place 1 tablet (75 mg total) into feeding tube daily.  . collagenase (SANTYL) ointment Apply 1 application topically daily.  Marland Kitchen doxycycline (VIBRAMYCIN) 100 MG capsule Place 100 mg into feeding tube 2 (two) times daily. X7 days  . hydrALAZINE (APRESOLINE) 25 MG tablet Take 25 mg by mouth every 8 (eight) hours.  . insulin detemir (LEVEMIR) 100 UNIT/ML injection Inject 12 Units into the skin 2 (two) times daily.  . metoprolol tartrate (LOPRESSOR) 25 MG tablet Place 25 mg into feeding tube 2 (two) times daily.  . Nutritional Supplements (FEEDING SUPPLEMENT, GLUCERNA 1.2 CAL,) LIQD Place 240 mLs into feeding tube in the morning, at noon, in the evening, and at bedtime. Give bolus via tube, resume continuous feedings when pump arrives  . pantoprazole sodium (PROTONIX) 40 mg/20 mL PACK Place 20 mLs (40 mg total) into feeding tube daily.  Marland Kitchen  polyethylene glycol (MIRALAX / GLYCOLAX) 17 g packet Place 17 g into feeding tube daily as needed for mild constipation.  . rosuvastatin (CRESTOR) 5 MG tablet Place 1 tablet (5 mg total) into feeding tube daily.  Marland Kitchen saccharomyces boulardii (FLORASTOR) 250 MG capsule Take 250 mg by mouth 2 (two) times daily.   No facility-administered encounter medications on file as of 09/21/2020.    Review of  Systems  Unable to obtain due to aphasia   Immunization History  Administered Date(s) Administered  . Influenza, High Dose Seasonal PF 05/04/2016  . Influenza-Unspecified 06/17/2020  . PFIZER(Purple Top)SARS-COV-2 Vaccination 10/03/2019, 10/24/2019  . Pneumococcal Polysaccharide-23 08/29/2003, 06/30/2009  . Zoster 01/29/2006   Pertinent  Health Maintenance Due  Topic Date Due  . FOOT EXAM  Never done  . OPHTHALMOLOGY EXAM  Never done  . COLONOSCOPY (Pts 45-62yrs Insurance coverage will need to be confirmed)  Never done  . MAMMOGRAM  Never done  . DEXA SCAN  Never done  . PNA vac Low Risk Adult (1 of 2 - PCV13) 05/04/2011  . HEMOGLOBIN A1C  08/25/2020  . INFLUENZA VACCINE  Completed   No flowsheet data found.   Vitals:   09/21/20 1424  BP: (!) 127/55  Pulse: (!) 58  Resp: 16  Temp: 97.6 F (36.4 C)  Weight: 107 lb (48.5 kg)  Height: 5\' 1"  (1.549 m)   Body mass index is 20.22 kg/m.  Physical Exam  GENERAL APPEARANCE: Well nourished. In no acute distress. Normal body habitus SKIN:  Sacral and left heel wound with dressing; rashes on back  dry MOUTH and THROAT: Lips are without lesions. Oral mucosa is moist and without lesions.  RESPIRATORY: Breathing is even & unlabored, BS CTAB CARDIAC: RRR, no murmur,no extra heart sounds, no edema GI: has peg tube GU:  Has foley catheter draining to urine bag NEUROLOGICAL: Aphasic. Unable to move all 4 extremities PSYCHIATRIC:  Affect and behavior are appropriate  Labs reviewed: Recent Labs    04/04/20 0315 04/05/20 0352 04/05/20 0942 04/06/20 0408 04/07/20 0555 04/09/20 0819 04/10/20 0251 04/22/20 0907 04/23/20 0314 04/26/20 0101  NA 128* 128*  --  137   < > 146*   < > 142 141 138  K 4.7 4.2  --  3.6   < > 3.7   < > 4.3 3.9 3.8  CL 93* 94*  --  102   < > 111   < > 107 107 107  CO2 20* 19*  --  21*   < > 24   < > 27 25 21*  GLUCOSE 219* 137*  --  115*   < > 178*   < > 176* 134* 181*  BUN 63* 77*  --  65*   < > 39*    < > 44* 36* 22  CREATININE 1.42* 1.87*  --  1.57*   < > 0.99   < > 0.64 0.59 0.56  CALCIUM 9.7 9.2  --  9.1   < > 9.2   < > 8.9 8.8* 8.7*  MG 2.0 2.1  --  2.1  --   --   --   --   --   --   PHOS 3.7  --  4.5 4.1  --  3.5  --   --   --   --    < > = values in this interval not displayed.   Recent Labs    03/20/20 0216 03/21/20 0415 03/22/20 0306 04/04/20 0315  AST  48* 29 60*  --   ALT 95* 91* 121*  --   ALKPHOS 57 56 54  --   BILITOT 0.6 0.5 0.6  --   PROT 6.8 6.8 6.8  --   ALBUMIN 2.6* 2.6* 2.6* 2.5*   Recent Labs    04/19/20 0155 04/20/20 0256 04/23/20 0314 04/26/20 0101  WBC 10.4 12.1* 9.1 9.6  NEUTROABS 6.9 8.5* 4.9  --   HGB 8.4* 8.6* 7.4* 8.3*  HCT 26.9* 28.9* 24.7* 26.1*  MCV 97.5 98.0 98.4 97.0  PLT 424* 385 312 325   Lab Results  Component Value Date   TSH 2.232 03/08/2020   Lab Results  Component Value Date   HGBA1C 7 02/24/2020   Lab Results  Component Value Date   CHOL 223 (H) 02/16/2020   HDL 49 02/16/2020   LDLCALC 143 (H) 02/16/2020   TRIG 157 (H) 02/16/2020   CHOLHDL 4.6 02/16/2020     Assessment/Plan  1. UTI due to extended-spectrum beta lactamase (ESBL) producing Escherichia coli -  Will be started on Cipro 500 mg BID X 7 days -  Continue contact isolation  2. Cellulitis of trunk, unspecified site of trunk -  Improved -  No side effects noted from Doxycycline -  Will discontinue Tetracycline and was started on Cipro     Family/ staff Communication: Discussed plan of care with charge nurse.  Labs/tests ordered: None  Goals of care:   Long-term care   Kenard Gower, DNP, MSN, FNP-BC [facility] [phone] (Monday-Friday 8:00 a.m. - 5:00 p.m.) [phone] (after hours)

## 2020-09-22 ENCOUNTER — Other Ambulatory Visit (HOSPITAL_COMMUNITY): Payer: Self-pay | Admitting: Internal Medicine

## 2020-09-22 DIAGNOSIS — E46 Unspecified protein-calorie malnutrition: Secondary | ICD-10-CM

## 2020-09-27 ENCOUNTER — Other Ambulatory Visit (HOSPITAL_COMMUNITY): Payer: Self-pay | Admitting: Internal Medicine

## 2020-09-27 ENCOUNTER — Ambulatory Visit (HOSPITAL_COMMUNITY)
Admission: RE | Admit: 2020-09-27 | Discharge: 2020-09-27 | Disposition: A | Discharge status: Home / Self Care | Payer: Self-pay | Source: Ambulatory Visit | Attending: Internal Medicine | Admitting: Internal Medicine

## 2020-09-27 ENCOUNTER — Other Ambulatory Visit: Payer: Self-pay

## 2020-09-27 DIAGNOSIS — E46 Unspecified protein-calorie malnutrition: Secondary | ICD-10-CM

## 2020-09-27 DIAGNOSIS — Z431 Encounter for attention to gastrostomy: Secondary | ICD-10-CM | POA: Insufficient documentation

## 2020-09-27 HISTORY — PX: IR CM INJ ANY COLONIC TUBE W/FLUORO: IMG2336

## 2020-09-27 IMAGING — XA IR GI TUBE CONTRAST INJECTION
1 series · 1 of 1 positions shown · non-contrast
Comparison: none

INDICATION: 74 year old female with history of stroke and dysphagia with
indwelling gastrostomy tube placed [DATE], presenting with possible
tube malfunction.

[Series 1: fl angio · 1 of 1 slices shown]
[im 1/1]
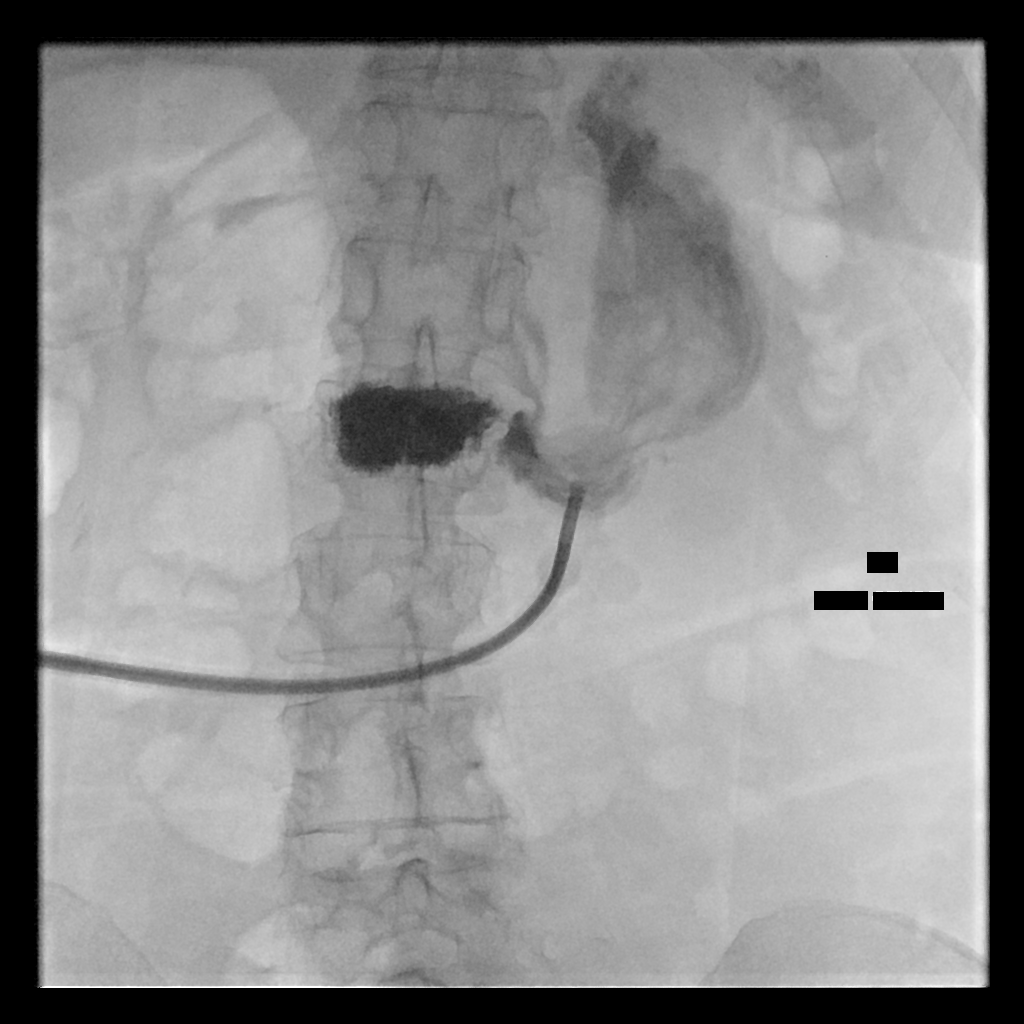

[1 of 1 positions shown; findings below may reference images not displayed]

EXAM:
GI TUBE INJECTION

MEDICATIONS:
None.

ANESTHESIA/SEDATION:
None.

CONTRAST:  10 mL Omnipaque 300-administered into the gastric lumen.

FLUOROSCOPY TIME:  Fluoroscopy Time: 0 minutes 6 seconds (1 mGy).

COMPLICATIONS:
None immediate.

PROCEDURE:
Informed written consent was obtained from the patient after a
thorough discussion of the procedural risks, benefits and
alternatives. All questions were addressed. Maximal Sterile Barrier
Technique was utilized including caps, mask, sterile gowns, sterile
gloves, sterile drape, hand hygiene and skin antiseptic. A timeout
was performed prior to the initiation of the procedure.

The external bumper portion of the gastrostomy tube was noted to be
loosened, approximately 5 cm from the skin surface. The bumper was
cinched down to the level of the skin surface. Contrast injection
via the gastrostomy tube demonstrated appropriate positioning within
the gastric body. Contrast flowed freely into the stomach. No
leakage was noted. The tube was flushed with sterile saline. The
patient tolerated procedure well.
IMPRESSION: Appropriately functioning and positioned indwelling gastrostomy
tube. Catheter is ready for continued use as needed.

## 2020-09-27 MED ORDER — IOHEXOL 300 MG/ML  SOLN: 50.0000 mL | Freq: Once | INTRAMUSCULAR | Status: DC | PRN

## 2020-09-27 NOTE — Procedures (Signed)
Interventional Radiology Procedure Note  Procedure: Gastrostomy tube check  Findings: Please refer to procedural dictation for full description.  Bumper several centimeters from skin surface with oozing at skin entry site.  Bumper cinched to appropriate location.  Contrast injection shows good position in stomach without leaking.  No exchange performed.  Complications: None immediate  Estimated Blood Loss: None.  Recommendations: OK to use gastrostomy as needed.  No need for exchange at this time.   Marliss Coots, MD

## 2020-10-05 ENCOUNTER — Encounter: Payer: Self-pay | Admitting: Internal Medicine

## 2020-10-05 ENCOUNTER — Non-Acute Institutional Stay (SKILLED_NURSING_FACILITY): Payer: Self-pay | Admitting: Internal Medicine

## 2020-10-05 DIAGNOSIS — L03319 Cellulitis of trunk, unspecified: Secondary | ICD-10-CM | POA: Insufficient documentation

## 2020-10-05 DIAGNOSIS — E1151 Type 2 diabetes mellitus with diabetic peripheral angiopathy without gangrene: Secondary | ICD-10-CM

## 2020-10-05 DIAGNOSIS — L03312 Cellulitis of back [any part except buttock]: Secondary | ICD-10-CM

## 2020-10-05 DIAGNOSIS — N63 Unspecified lump in unspecified breast: Secondary | ICD-10-CM

## 2020-10-05 NOTE — Assessment & Plan Note (Addendum)
Her current detemir insulin regimen of 12 units every 12 hours will be increased to 15 units every 12 hours as the majority of glucoses are in the 200-250 range.  Glucoses have been as high as 319 as low as 121, but those values are outliers.

## 2020-10-05 NOTE — Progress Notes (Signed)
NURSING HOME LOCATION:  [facility] ROOM NUMBER:  [id number]  CODE STATUS:  FULL CODE  PCP:  Pecola Lawless, MD  [address] [place] [address]  This is a nursing facility follow up visit for specific acute issue of rash over posterior thorax.  Interim medical record and care since last [facility] visit was updated with review of diagnostic studies and change in clinical status since last visit were documented.  HPI:She is a permanent resident facility, admitted 04/27/2020 following hospitalization for acute encephalopathy associated with intractable nausea and vomiting in the context of AKI and lactic acidosis.  MRI 7/18 documented an acute left pontine infarct and an acute small infarct in the posterior right lateral ventricle.  She received DAPT for 3 weeks.  Stroke was complicated by severe dysphagia with aspiration pneumonia X 2. PEG placement was performed 8/4 to allow tube feedings. With rehydration hypernatremia and AKI resolved.  Course was also complicated by bilateral stage I heel ulcers and unstageable sacral ulcer.  CT of the chest 8/12 suggested a left breast mass.  Once clinically stable; mammogram were to be considered. Other medical diagnoses include insulin-dependent diabetes with neurovascular complications, essential hypertension, dyslipidemia, and history of MI.  Glucoses at the SNF have ranged from a low of 121 to a high of 319.  These 2 values are outliers.  The majority of the glucoses are in the 200-250 range.  Her most recent A1c was 7.5% indicating adequate control. On 1/19 lipids were performed which revealed an LDL of 29 which is at goal.  At that time creatinine was 0.4 and GFR 90.  She has stable chronic anemia with H/H of 9.9/30.  There was mild elevation of the white count to 11.8.  Review of systems: Patient is essentially aphasic except for very rare monosyllable responses. She had been on doxycycline  week before last and the Wound Care Nurse reports significant improvement in the rash on this.  It was stopped as there is some question of tetracycline allergy/ anaphylaxis history.  There was no adverse reaction while on the drug.  The patient has been on an air mattress.  Physical exam:  Pertinent or positive findings: She stares straight ahead with mouth agape.  She will look at the interviewer, but expression is blank.  She has a grade 1/2-1 systolic murmur at the base.  Pedal pulses are decreased.  The feet are in booties and the hands are wrapped in rigid supports.  There is no spontaneous movement of the extremities.  Breast exam reveals she has diffuse , tiny fibrocystic changes with no dominant nodule.  There was no cervical lymphadenopathy.The axillae could not be examined as she tensed when exam was attempted and the upper extremities became rigid and nonmovable. There is a diffuse light  bronze colored rash over the back sparing only the left scapular area.  This does blanch to some extent with pressure.  There is no increase in temperature.  General appearance: Adequately nourished; no acute distress, increased work of breathing is present.   Eyes: No conjunctival inflammation or lid edema is present. There is no scleral icterus. Ears:  External ear exam shows no significant lesions or deformities.   Nose:  External nasal examination shows no deformity or inflammation. Nasal mucosa are pink and moist without lesions, exudates Neck:  No thyromegaly, masses, tenderness noted.    Heart:  Normal rate and regular rhythm. S1 and S2 normal without gallop, click, rub .  Lungs:  without wheezes, rhonchi, rales, rubs. Abdomen: Bowel sounds are normal. Abdomen is soft and nontender with no organomegaly, hernias, masses. GU: Deferred  Extremities:  No cyanosis, clubbing, edema  Neurologic exam :Balance, Rhomberg, finger to nose testing could not be completed due to clinical state Skin: Warm & dry  w/o tenting.  See summary under each active problem in the Problem List with associated updated therapeutic plan

## 2020-10-05 NOTE — Patient Instructions (Signed)
See assessment and plan under each diagnosis in the problem list and acutely for this visit 

## 2020-10-05 NOTE — Assessment & Plan Note (Addendum)
10/05/2020 breast exam reveals diffuse minor fibronodular changes with no dominant mass. She is not a candidate for mammogram as she cannot stand for the exam.  She is completely bedridden and immobile.  Periodic manual breast exams will be performed here at the facility.

## 2020-10-05 NOTE — Assessment & Plan Note (Addendum)
Based on Up to Date review of nonpurulent cellulitis; cephalexin will be initiated x7 days with monitor by staff of response.

## 2020-10-13 ENCOUNTER — Non-Acute Institutional Stay (SKILLED_NURSING_FACILITY): Payer: Self-pay | Admitting: Adult Health

## 2020-10-13 ENCOUNTER — Encounter: Payer: Self-pay | Admitting: Adult Health

## 2020-10-13 DIAGNOSIS — I10 Essential (primary) hypertension: Secondary | ICD-10-CM

## 2020-10-13 DIAGNOSIS — R1310 Dysphagia, unspecified: Secondary | ICD-10-CM

## 2020-10-13 DIAGNOSIS — I6930 Unspecified sequelae of cerebral infarction: Secondary | ICD-10-CM

## 2020-10-13 DIAGNOSIS — E1151 Type 2 diabetes mellitus with diabetic peripheral angiopathy without gangrene: Secondary | ICD-10-CM

## 2020-10-13 DIAGNOSIS — L03312 Cellulitis of back [any part except buttock]: Secondary | ICD-10-CM

## 2020-10-13 NOTE — Progress Notes (Signed)
Location:  [facility] Room Number: 221/B Place of Service:  SNF (31) Provider:  Kenard Gower, DNP, FNP-BC  Patient Care Team: Pecola Lawless, MD as PCP - General (Internal Medicine) Medina-Vargas, Margit Banda, NP as Nurse Practitioner (Internal Medicine) Rehab, [facility])  Extended Emergency Contact Information Primary Emergency Contact: cecia, egge Mobile Phone: [phone] Relation: Son Secondary Emergency Contact: Lateesha, Bezold Mobile Phone: [phone] Relation: Daughter  Code Status:  Full Code   Goals of care: Advanced Directive information Advanced Directives 10/13/2020  Does Patient Have a Medical Advance Directive? No  Would patient like information on creating a medical advance directive? No - Patient declined     Chief Complaint  Patient presents with  . Medical Management of Chronic Issues    Routine Visit of Medical Management     HPI:  Pt is a 75 y.o. female seen today for medical management of chronic diseases. She is a long-term care resident of [facility]. She has a PMH of MI, dyslipidemia and essential hypertension. CBGs ranging from 108 to 257. She takes Levemir 15 units twice a day for diabetes mellitus. She is S/P antibiotic, day 1, for cellulitis of back. No erythema noted on back.    Past Medical History:  Diagnosis Date  . 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 Essential (primary) hypertension   . History of falling   . MI (myocardial infarction) (HCC)   . Stroke [facility])    Past Surgical History:  Procedure Laterality Date  . ABDOMINAL HYSTERECTOMY    . APPENDECTOMY    . BACK SURGERY    . IR CM INJ ANY COLONIC TUBE W/FLUORO  09/27/2020  . IR GASTROSTOMY TUBE MOD SED  03/31/2020    Allergies  Allergen Reactions  . Allopurinol Shortness Of Breath  . Tetracycline Anaphylaxis    She was placed on Doxycycline for  truncal cellulitis in January 2022 w/o ADE & with improvement in the cellulitis..  . Amoxicillin-Pot Clavulanate Diarrhea  . Atorvastatin Other (See Comments)    Unknown reaction - reported by [facility], [place] clinic 05/10/2012  . Azithromycin Other (See Comments)    Caused blisters inside and out  . Codeine Other (See Comments)    Altered mental status  . Lisinopril Other (See Comments)    Unknown reaction - reported by [facility], [place] clinic 12/23/2008 02/20/2020 she believes "reaction" was NP cough. No PMH of angioedema  . Sulfa Antibiotics Diarrhea    Outpatient Encounter Medications as of 10/13/2020  Medication Sig  . amLODipine (NORVASC) 10 MG tablet Place 10 mg into feeding tube daily.  . bisacodyl (DULCOLAX) 10 MG suppository Place 10 mg rectally as needed for moderate constipation. If MOM doesn't work  . clopidogrel (PLAVIX) 75 MG tablet Place 1 tablet (75 mg total) into feeding tube daily.  . hydrALAZINE (APRESOLINE) 25 MG tablet Place 25 mg into feeding tube every 8 (eight) hours.  . insulin detemir (LEVEMIR) 100 UNIT/ML injection Inject 15 Units into the skin 2 (two) times daily.  . metoprolol tartrate (LOPRESSOR) 25 MG tablet Place 25 mg into feeding tube 2 (two) times daily.  . Nutritional Supplements (FEEDING SUPPLEMENT, GLUCERNA 1.2 CAL,) LIQD Place into feeding tube every 6 (six) hours. Place at 60 ml/hr via PEG. Water flush to 125 ml Q6h.  . pantoprazole sodium (PROTONIX) 40 mg/20 mL PACK Place 20 mLs (40 mg total) into feeding tube daily.  . polyethylene glycol (MIRALAX / GLYCOLAX)  17 g packet Place 17 g into feeding tube daily as needed for mild constipation.  . rosuvastatin (CRESTOR) 5 MG tablet Place 1 tablet (5 mg total) into feeding tube daily.   No facility-administered encounter medications on file as of 10/13/2020.    Review of Systems  Unable to obtain due to aphasia   Immunization History  Administered Date(s) Administered  . Influenza, High Dose Seasonal PF  05/04/2016  . Influenza-Unspecified 06/17/2020  . PFIZER(Purple Top)SARS-COV-2 Vaccination 10/03/2019, 10/24/2019  . Pneumococcal Polysaccharide-23 08/29/2003, 06/30/2009  . Zoster 01/29/2006   Pertinent  Health Maintenance Due  Topic Date Due  . FOOT EXAM  Never done  . OPHTHALMOLOGY EXAM  Never done  . COLONOSCOPY (Pts 45-106yrs Insurance coverage will need to be confirmed)  Never done  . MAMMOGRAM  Never done  . DEXA SCAN  Never done  . PNA vac Low Risk Adult (1 of 2 - PCV13) 05/04/2011  . HEMOGLOBIN A1C  03/15/2021  . INFLUENZA VACCINE  Completed   No flowsheet data found.   Vitals:   10/13/20 1545  BP: 126/66  Pulse: 66  Resp: 17  Temp: 97.7 F (36.5 C)  Weight: 112 lb 12.8 oz (51.2 kg)  Height: 5\' 1"  (1.549 m)   Body mass index is 21.31 kg/m.  Physical Exam  GENERAL APPEARANCE: Well nourished. In no acute distress.  SKIN:  Skin is warm and dry.  MOUTH and THROAT: Lips are without lesions. Oral mucosa is moist and without lesions. Tongue is normal in shape, size, and color and without lesions RESPIRATORY: Breathing is even & unlabored, BS CTAB CARDIAC: RRR, no murmur,no extra heart sounds, no edema GI: Abdomen soft, normal BS, no masses, no tenderness NEUROLOGICAL: There is no tremor. Aphasic. Unable to move X 4 extremities. PSYCHIATRIC:  Affect and behavior are appropriate  Labs reviewed: Recent Labs    04/04/20 0315 04/05/20 0352 04/05/20 06/05/20 04/06/20 0408 04/07/20 0555 04/09/20 0819 04/10/20 0251 04/22/20 0907 04/23/20 0314 04/26/20 0101 09/15/20 0000  NA 128* 128*  --  137   < > 146*   < > 142 141 138 138  K 4.7 4.2  --  3.6   < > 3.7   < > 4.3 3.9 3.8 3.8  CL 93* 94*  --  102   < > 111   < > 107 107 107 102  CO2 20* 19*  --  21*   < > 24   < > 27 25 21* 24*  GLUCOSE 219* 137*  --  115*   < > 178*   < > 176* 134* 181*  --   BUN 63* 77*  --  65*   < > 39*   < > 44* 36* 22 26*  CREATININE 1.42* 1.87*  --  1.57*   < > 0.99   < > 0.64 0.59 0.56  0.4*  CALCIUM 9.7 9.2  --  9.1   < > 9.2   < > 8.9 8.8* 8.7* 9.6  MG 2.0 2.1  --  2.1  --   --   --   --   --   --   --   PHOS 3.7  --  4.5 4.1  --  3.5  --   --   --   --   --    < > = values in this interval not displayed.   Recent Labs    03/20/20 0216 03/21/20 0415 03/22/20 0306 04/04/20 0315 09/15/20 0000  AST  48* 29 60*  --  10*  ALT 95* 91* 121*  --  13  ALKPHOS 57 56 54  --  82  BILITOT 0.6 0.5 0.6  --   --   PROT 6.8 6.8 6.8  --   --   ALBUMIN 2.6* 2.6* 2.6* 2.5* 3.3*   Recent Labs    04/20/20 0256 04/23/20 0314 04/26/20 0101 09/15/20 0000  WBC 12.1* 9.1 9.6 11.8  NEUTROABS 8.5* 4.9  --  8.70  HGB 8.6* 7.4* 8.3* 9.9*  HCT 28.9* 24.7* 26.1* 30*  MCV 98.0 98.4 97.0  --   PLT 385 312 325 305   Lab Results  Component Value Date   TSH 2.232 03/08/2020   Lab Results  Component Value Date   HGBA1C 7.5 09/15/2020   Lab Results  Component Value Date   CHOL 118 09/15/2020   HDL 36 09/15/2020   LDLCALC 29 09/15/2020   TRIG 265 (A) 09/15/2020   CHOLHDL 4.6 02/16/2020    Significant Diagnostic Results in last 30 days:  IR Cm Inj Any Colonic Tube W/Fluoro  Result Date: 09/27/2020 INDICATION: 75 year old female with history of stroke and dysphagia with indwelling gastrostomy tube placed 03/31/20, presenting with possible tube malfunction. EXAM: GI TUBE INJECTION MEDICATIONS: None. ANESTHESIA/SEDATION: None. CONTRAST:  10 mL Omnipaque 300-administered into the gastric lumen. FLUOROSCOPY TIME:  Fluoroscopy Time: 0 minutes 6 seconds (1 mGy). COMPLICATIONS: None immediate. PROCEDURE: Informed written consent was obtained from the patient after a thorough discussion of the procedural risks, benefits and alternatives. All questions were addressed. Maximal Sterile Barrier Technique was utilized including caps, mask, sterile gowns, sterile gloves, sterile drape, hand hygiene and skin antiseptic. A timeout was performed prior to the initiation of the procedure. The external bumper  portion of the gastrostomy tube was noted to be loosened, approximately 5 cm from the skin surface. The bumper was cinched down to the level of the skin surface. Contrast injection via the gastrostomy tube demonstrated appropriate positioning within the gastric body. Contrast flowed freely into the stomach. No leakage was noted. The tube was flushed with sterile saline. The patient tolerated procedure well. IMPRESSION: Appropriately functioning and positioned indwelling gastrostomy tube. Catheter is ready for continued use as needed. Marliss Coots, MD [facility] Electronically Signed   By: Marliss Coots MD   On: 09/27/2020 12:04    Assessment/Plan  1. DM (diabetes mellitus), type 2 with peripheral vascular complications [facility]) Lab Results  Component Value Date   HGBA1C 7.5 09/15/2020   -   Continue Levemir  2. Cellulitis of back except buttock -   S/P Keflex -    Back has no erythema, resolved  3. History of CVA with residual deficit -  Stable, continue clopidogrel and rosuvastatin   4. Essential (primary) hypertension -  Stable, continue amlodipine, hydralazine and metoprolol tartrate  5. Dysphagia, unspecified type -  On peg tube feedings with Glucerna 1.3 cal at 60 mL/hour -   Aspiration precautions     Family/ staff Communication: Discussed plan of care with charge nurse.  Labs/tests ordered: None  Goals of care:   Long-term care   Kenard Gower, DNP, MSN, FNP-BC [facility] [phone] (Monday-Friday 8:00 a.m. - 5:00 p.m.) [phone] (after hours)

## 2020-10-18 IMAGING — CR DG ELBOW COMPLETE 3+V*R*
4 series · 4 of 4 positions shown · non-contrast
Comparison: None.

CLINICAL DATA: Pain following fall

EXAM:
RIGHT ELBOW - COMPLETE 3+ VIEW

[x elbow ap right]
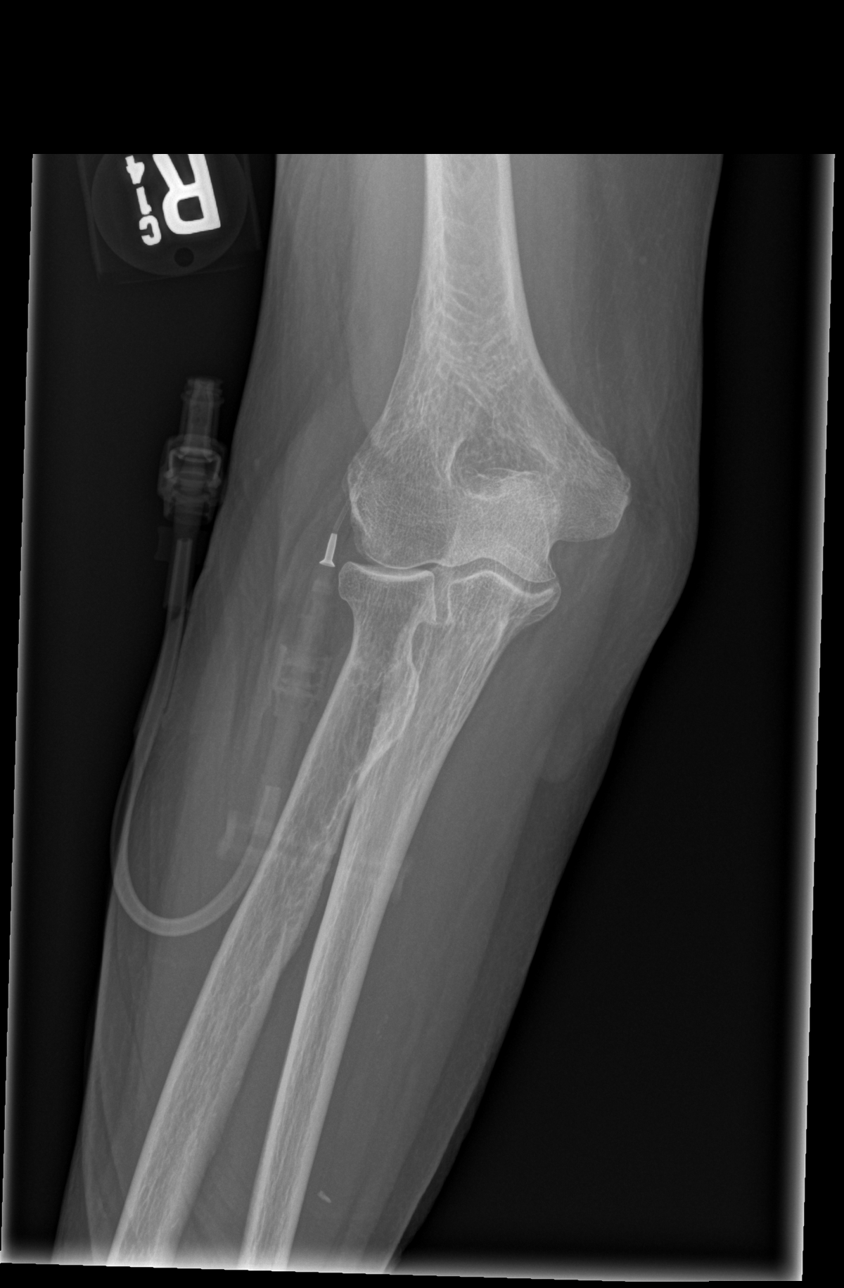

[x elbow obl right (1 of 2)]
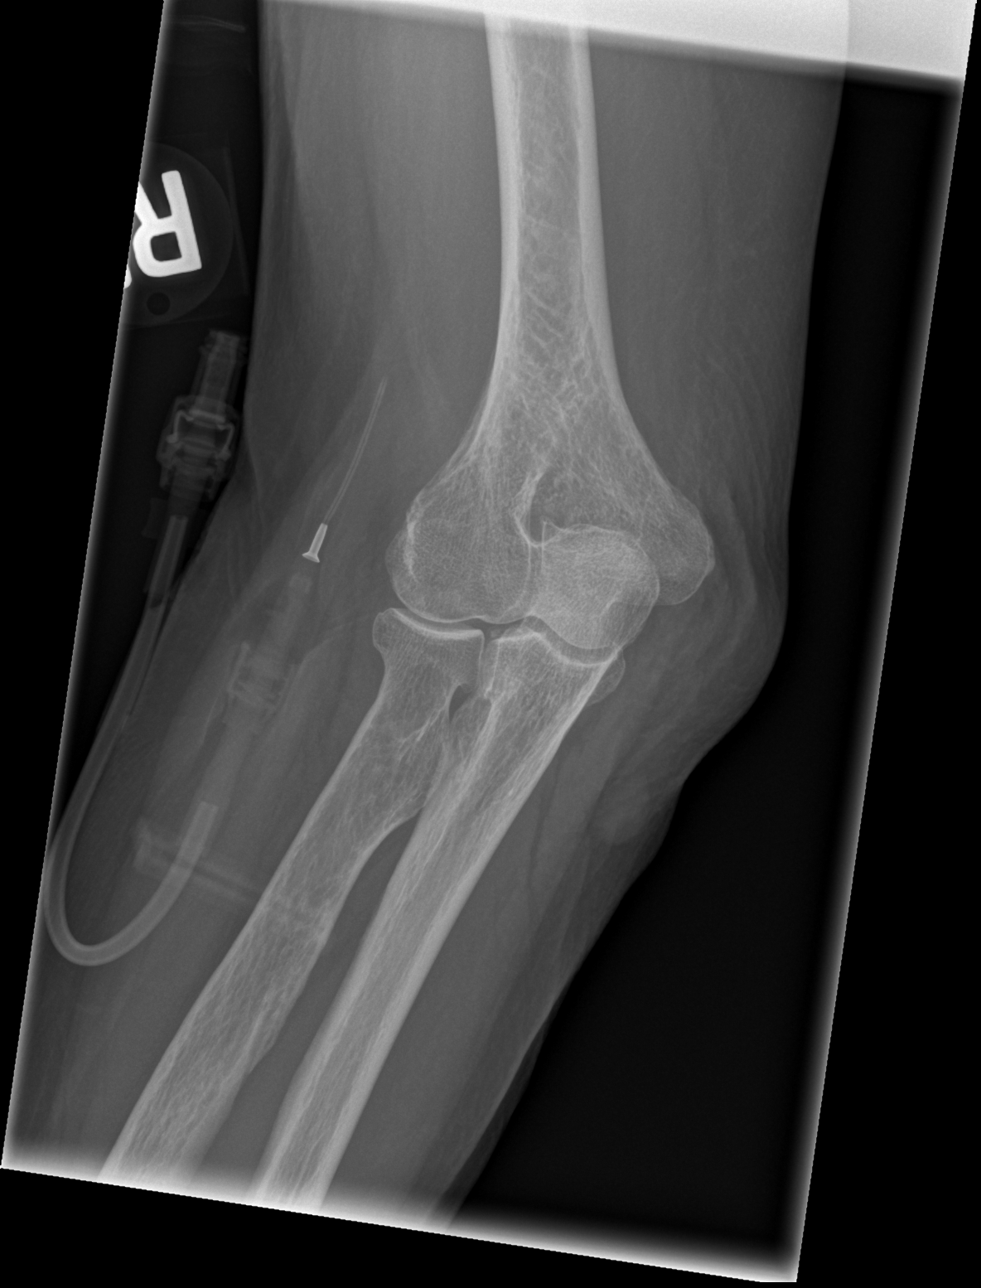

[x elbow obl right (2 of 2)]
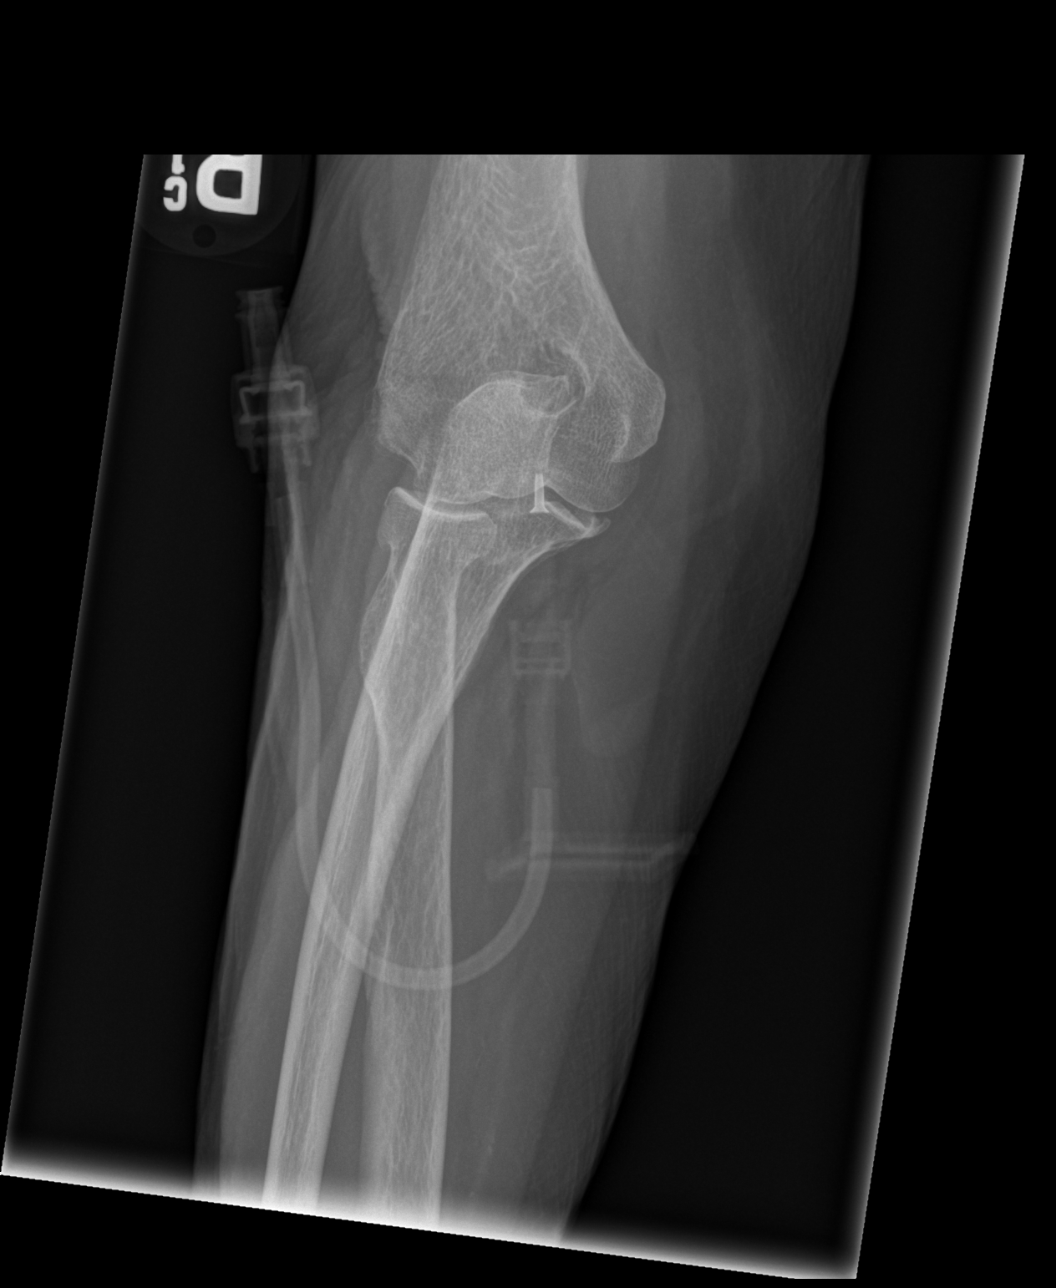

[x elbow lat right]
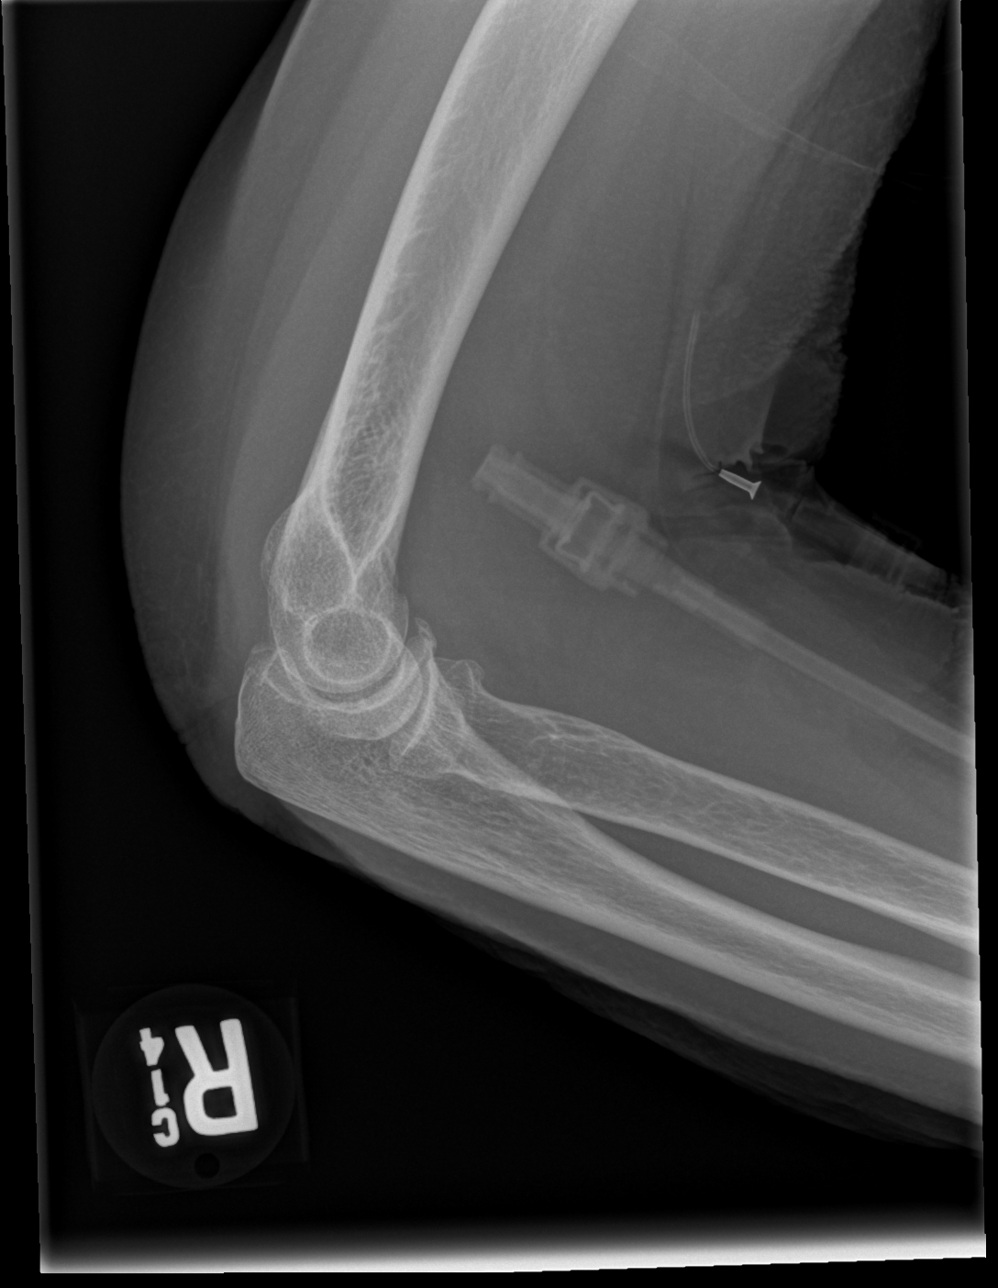

[4 of 4 positions shown; findings below may reference images not displayed]

FINDINGS: Frontal, lateral, and bilateral oblique views were obtained. There
is no fracture or dislocation. No joint effusion. No joint space
narrowing or erosion. There is a spur arising from the coracoid
process of proximal ulna.
IMPRESSION: Coracoid process proximal ulnar spur. No joint space narrowing. No
fracture or dislocation.

## 2020-10-19 ENCOUNTER — Other Ambulatory Visit: Payer: Self-pay

## 2020-10-19 ENCOUNTER — Non-Acute Institutional Stay: Payer: Self-pay | Admitting: Hospice

## 2020-10-19 DIAGNOSIS — Z515 Encounter for palliative care: Secondary | ICD-10-CM

## 2020-10-19 DIAGNOSIS — R1312 Dysphagia, oropharyngeal phase: Secondary | ICD-10-CM

## 2020-10-19 DIAGNOSIS — F0390 Unspecified dementia without behavioral disturbance: Secondary | ICD-10-CM

## 2020-10-19 NOTE — Progress Notes (Signed)
Therapist, nutritional Palliative Care Consult Note Telephone: [phone]  Fax: [phone]  PATIENT NAME: Tasha [name] DOB: Nov 13, 1945 MRN: [id number]  PRIMARY CARE PROVIDER:   Pecola Lawless, MD Pecola Lawless, MD [address] [place],  [place] [address]  REFERRING PROVIDER: Pecola Lawless, MD Pecola Lawless, MD [address] [place],  [place] [address]  RESPONSIBLE PARTY:Extended Emergency Contact Information Primary Emergency Contact: secret, kristensen Mobile Phone: [phone] Relation: Son Secondary Emergency Contact: Shanea, Karney Mobile Phone: [phone] Relation: Daughter  Visit is to build trust and highlight Palliative Medicine as specialized medical care for people living with serious illness, aimed at facilitating better quality of life through symptoms relief, assisting with advance care plan and establishing goals of care.   CHIEF COMPLAINT: Follow up palliative visit/dysphagia  RECOMMENDATIONS/PLAN:   1. Advance Care Planning/Code Status: Discussion with [name] today on patient's CODE STATUS.  He said family was not able to come to a decision to change patient's CODE STATUS; patient remains a full code at this time.  2. Goals of Care: Goals of care include to maximize quality of life and symptom management.  Visit consisted of counseling and education dealing with the complex and emotionally intense issues of symptom management and palliative care in the setting of serious and potentially life-threatening illness. Palliative care team will continue to support patient, patient's family, and medical team.  I spent 16 minutes providing this consultation. More than 50% of the time in this consultation was spent on coordinating communication.  -------------------------------------------------------------------------------------------------------------------------------------------------- 3. Symptom management/Plan:   Dysphagia: In the context of advanced dementia and history of stroke/MI.  Nothing by mouth.  Continue Glucerna 1.2 at 60 mils per hour via PEG tube.  200 mL water flushes every 4 hours. Palliative will continue to monitor for symptom management/decline and make recommendations as needed. Return 2 months or prn. Encouraged to call provider sooner with any concerns.   HISTORY OF PRESENT ILLNESS:  Tasha [name] is a 75 y.o. female with multiple medical problems including oropharyngeal dysphagia in the context of history of stroke/MI and dementia.  Patient not able to swallow by mouth, receives nutrition via PEG tube with Glucerna 1.2 at 60 mils per hour and water flushes every 4 hours.  History of sacral wound, Type 2 diabetes mellitus, hypertension.  Patient is mostly nonverbal and noninteractive.  History obtained from review of EMR, discussion with primary nurse, patient/family. Review and summarization of Epic records shows history from other than patient. Rest of 10 point ROS asked and negative. Palliative Care was asked to follow this patient by consultation request of Pecola Lawless, MD/Monina [name] Clipper NP to help address complex decision making in the context of goals of care.   CODE STATUS: Full  PPS: 20%  HOSPICE ELIGIBILITY/DIAGNOSIS: TBD  PAST MEDICAL HISTORY:  Past Medical History:  Diagnosis Date  . 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 Essential (primary) hypertension   . History of falling   . MI (myocardial infarction) (HCC)   . Stroke [facility])     SOCIAL HX: @SOCX  Patient in SNF  for ongoing care   FAMILY HX:  Family History  Adopted: Yes  Family history unknown: Yes    Review lab tests/diagnostics No results for input(s): WBC, HGB, HCT, PLT, MCV in the last 168 hours. No results for input(s): NA, K, CL, CO2, BUN, CREATININE, GLUCOSE in the last 168 hours. Latest GFR by (not valid in AKI  or ESRD) CrCl cannot be  calculated (Patient's most recent lab result is older than the maximum 21 days allowed.). No results for input(s): AST, ALT, ALKPHOS, GGT in the last 168 hours.  Invalid input(s): TBILI, CONJBILI, ALB, TOTALPROTEIN No components found for: ALB No results for input(s): APTT, INR in the last 168 hours.  Invalid input(s): PTPATIENT No results for input(s): BNP, PROBNP in the last 168 hours.  ALLERGIES:  Allergies  Allergen Reactions  . Allopurinol Shortness Of Breath  . Tetracycline Anaphylaxis    She was placed on Doxycycline for truncal cellulitis in January 2022 w/o ADE & with improvement in the cellulitis..  . Amoxicillin-Pot Clavulanate Diarrhea  . Atorvastatin Other (See Comments)    Unknown reaction - reported by [facility], [place] clinic 05/10/2012  . Azithromycin Other (See Comments)    Caused blisters inside and out  . Codeine Other (See Comments)    Altered mental status  . Lisinopril Other (See Comments)    Unknown reaction - reported by [facility], [place] clinic 12/23/2008 02/20/2020 she believes "reaction" was NP cough. No PMH of angioedema  . Sulfa Antibiotics Diarrhea      PHYSICAL EXAM  General: In no acute distress, FLACC 0 Cardiovascular: regular rate and rhythm Pulmonary: no cough, no increased work of breathing, normal respiratory effort Abdomen: soft, non tender, positive bowel sounds in all quadrants, presence of PEG tube, patent GU:  no suprapubic tenderness Eyes: Normal lids, no discharge, sclera anicteric ENMT: Moist mucous membranes Musculoskeletal:  no edema in BLE Skin: no rash to visible skin, warm without cyanosis; sacral wound managed by facility wound nurse Psych: non-anxious affect Neurological: Weakness but otherwise non focal Heme/lymph/immuno: no bruises, no bleeding  Thank you for the opportunity to participate in the care of [name] Please call our office at [phone] if we can be of additional assistance.  Note: Portions of this note were  generated with Scientist, clinical (histocompatibility and immunogenetics). Dictation errors may occur despite best attempts at proofreading.  Rosaura Carpenter, NP

## 2020-10-30 ENCOUNTER — Other Ambulatory Visit: Payer: Self-pay | Admitting: Adult Health

## 2020-10-30 MED ORDER — MICONAZOLE NITRATE 1200 & 2 MG & % VA KIT: 1.0000 | PACK | Freq: Once | VAGINAL | 0 refills | Status: AC

## 2020-11-05 ENCOUNTER — Encounter: Payer: Self-pay | Admitting: Adult Health

## 2020-11-05 ENCOUNTER — Non-Acute Institutional Stay (SKILLED_NURSING_FACILITY): Payer: Self-pay | Admitting: Adult Health

## 2020-11-05 DIAGNOSIS — I6930 Unspecified sequelae of cerebral infarction: Secondary | ICD-10-CM

## 2020-11-05 DIAGNOSIS — I10 Essential (primary) hypertension: Secondary | ICD-10-CM

## 2020-11-05 DIAGNOSIS — E1151 Type 2 diabetes mellitus with diabetic peripheral angiopathy without gangrene: Secondary | ICD-10-CM

## 2020-11-05 DIAGNOSIS — L89153 Pressure ulcer of sacral region, stage 3: Secondary | ICD-10-CM

## 2020-11-05 DIAGNOSIS — R1310 Dysphagia, unspecified: Secondary | ICD-10-CM

## 2020-11-05 NOTE — Progress Notes (Signed)
Location:  [facility] Room Number: 221 B Place of Service:  SNF (31) Provider:  Kenard Gower, DNP, FNP-BC  Patient Care Team: Pecola Lawless, MD as PCP - General (Internal Medicine) Medina-Vargas, Margit Banda, NP as Nurse Practitioner (Internal Medicine) Rehab, [facility]  Extended Emergency Contact Information Primary Emergency Contact: aino, heckert Mobile Phone: [phone] Relation: Son Secondary Emergency Contact: Demitra, Danley Mobile Phone: [phone] Relation: Daughter  Code Status:   Full Code  Goals of care: Advanced Directive information Advanced Directives 10/13/2020  Does Patient Have a Medical Advance Directive? No  Would patient like information on creating a medical advance directive? No - Patient declined     Chief Complaint  Patient presents with  . Medical Management of Chronic Issues    Routine Visit    HPI:  Pt is a 75 y.o. female seen today for medical management of chronic diseases. She is a long-term care resident of [facility]. She has a PMH of MI, dyslipidemia and essential hypertension. She was seen in the room today. She was nonverbal. She has good eye contact when greeted. SBPs ranging from 107 to 135, with outlier 173.  She takes amlodipine besylate 10 mg daily and metoprolol tartrate 25 mg twice a day for hypertension. Staff provides total care due to her not able to move X 4 extremities as a residual of her history of CVA. She takes clopidogrel 75 mg daily and rosuvastatin 5 mg daily for history of CVA.  She has a sacral pressure stage III.  Treatment is done daily and followed up by wound specialist.   Past Medical History:  Diagnosis Date  . 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 Essential (primary) hypertension   . History of falling   . MI (myocardial infarction) (HCC)   . Stroke [facility])    Past Surgical History:   Procedure Laterality Date  . ABDOMINAL HYSTERECTOMY    . APPENDECTOMY    . BACK SURGERY    . IR CM INJ ANY COLONIC TUBE W/FLUORO  09/27/2020  . IR GASTROSTOMY TUBE MOD SED  03/31/2020    Allergies  Allergen Reactions  . Allopurinol Shortness Of Breath  . Tetracycline Anaphylaxis    She was placed on Doxycycline for truncal cellulitis in January 2022 w/o ADE & with improvement in the cellulitis..  . Amoxicillin-Pot Clavulanate Diarrhea  . Atorvastatin Other (See Comments)    Unknown reaction - reported by [facility] [place] clinic 05/10/2012  . Azithromycin Other (See Comments)    Caused blisters inside and out  . Codeine Other (See Comments)    Altered mental status  . Lisinopril Other (See Comments)    Unknown reaction - reported by [facility] [place] clinic 12/23/2008 02/20/2020 she believes "reaction" was NP cough. No PMH of angioedema  . Sulfa Antibiotics Diarrhea    Outpatient Encounter Medications as of 11/05/2020  Medication Sig  . amLODipine (NORVASC) 10 MG tablet Place 10 mg into feeding tube daily.  . bisacodyl (DULCOLAX) 10 MG suppository Place 10 mg rectally as needed for moderate constipation. If MOM doesn't work  . clopidogrel (PLAVIX) 75 MG tablet Place 1 tablet (75 mg total) into feeding tube daily.  . hydrALAZINE (APRESOLINE) 25 MG tablet Place 25 mg into feeding tube every 8 (eight) hours.  . insulin detemir (LEVEMIR) 100 UNIT/ML injection Inject 15 Units into the skin 2 (two) times daily.  . metoprolol tartrate (LOPRESSOR) 25 MG tablet Place 25  mg into feeding tube 2 (two) times daily.  . Nutritional Supplements (FEEDING SUPPLEMENT, GLUCERNA 1.2 CAL,) LIQD Place into feeding tube every 6 (six) hours. Place at 60 ml/hr via PEG. Water flush to 125 ml Q6h.  . pantoprazole sodium (PROTONIX) 40 mg/20 mL PACK Place 20 mLs (40 mg total) into feeding tube daily.  . polyethylene glycol (MIRALAX / GLYCOLAX) 17 g packet Place 17 g into feeding tube daily as needed for mild constipation.   . rosuvastatin (CRESTOR) 5 MG tablet Place 1 tablet (5 mg total) into feeding tube daily.   No facility-administered encounter medications on file as of 11/05/2020.    Review of Systems unable to obtain due to being nonverbal.    Immunization History  Administered Date(s) Administered  . Influenza, High Dose Seasonal PF 05/04/2016  . Influenza-Unspecified 06/17/2020  . PFIZER(Purple Top)SARS-COV-2 Vaccination 10/03/2019, 10/24/2019  . Pneumococcal Polysaccharide-23 08/29/2003, 06/30/2009  . Zoster 01/29/2006   Pertinent  Health Maintenance Due  Topic Date Due  . FOOT EXAM  Never done  . OPHTHALMOLOGY EXAM  Never done  . COLONOSCOPY (Pts 45-61yrs Insurance coverage will need to be confirmed)  Never done  . MAMMOGRAM  Never done  . DEXA SCAN  Never done  . PNA vac Low Risk Adult (1 of 2 - PCV13) 05/04/2011  . HEMOGLOBIN A1C  03/15/2021  . INFLUENZA VACCINE  Completed   No flowsheet data found.   Vitals:   11/05/20 1100  Pulse: 76  Resp: 16  Temp: 98.8 F (37.1 C)  Weight: 116 lb 6.4 oz (52.8 kg)  Height: 5\' 1"  (1.549 m)   Body mass index is 21.99 kg/m.  Physical Exam  GENERAL APPEARANCE:  In no acute distress.  SKIN: Sacral pressure ulcer stage III  MOUTH and THROAT: Lips are without lesions. Oral mucosa is moist and without lesions.  RESPIRATORY: Breathing is even & unlabored, BS CTAB CARDIAC: RRR, no murmur,no extra heart sounds, no edema GI: Has peg tube intact GU:  Has foley catheter french 20 NEUROLOGICAL: There is no tremor. Nonverbal. Unable to move X 4 extremities. PSYCHIATRIC:  Affect and behavior are appropriate  Labs reviewed: Recent Labs    04/04/20 0315 04/05/20 0352 04/05/20 06/05/20 04/06/20 0408 04/07/20 0555 04/09/20 0819 04/10/20 0251 04/22/20 0907 04/23/20 0314 04/26/20 0101 09/15/20 0000  NA 128* 128*  --  137   < > 146*   < > 142 141 138 138  K 4.7 4.2  --  3.6   < > 3.7   < > 4.3 3.9 3.8 3.8  CL 93* 94*  --  102   < > 111   < >  107 107 107 102  CO2 20* 19*  --  21*   < > 24   < > 27 25 21* 24*  GLUCOSE 219* 137*  --  115*   < > 178*   < > 176* 134* 181*  --   BUN 63* 77*  --  65*   < > 39*   < > 44* 36* 22 26*  CREATININE 1.42* 1.87*  --  1.57*   < > 0.99   < > 0.64 0.59 0.56 0.4*  CALCIUM 9.7 9.2  --  9.1   < > 9.2   < > 8.9 8.8* 8.7* 9.6  MG 2.0 2.1  --  2.1  --   --   --   --   --   --   --   PHOS 3.7  --  4.5 4.1  --  3.5  --   --   --   --   --    < > = values in this interval not displayed.   Recent Labs    03/20/20 0216 03/21/20 0415 03/22/20 0306 04/04/20 0315 09/15/20 0000  AST 48* 29 60*  --  10*  ALT 95* 91* 121*  --  13  ALKPHOS 57 56 54  --  82  BILITOT 0.6 0.5 0.6  --   --   PROT 6.8 6.8 6.8  --   --   ALBUMIN 2.6* 2.6* 2.6* 2.5* 3.3*   Recent Labs    04/20/20 0256 04/23/20 0314 04/26/20 0101 09/15/20 0000  WBC 12.1* 9.1 9.6 11.8  NEUTROABS 8.5* 4.9  --  8.70  HGB 8.6* 7.4* 8.3* 9.9*  HCT 28.9* 24.7* 26.1* 30*  MCV 98.0 98.4 97.0  --   PLT 385 312 325 305   Lab Results  Component Value Date   TSH 2.232 03/08/2020   Lab Results  Component Value Date   HGBA1C 7.5 09/15/2020   Lab Results  Component Value Date   CHOL 118 09/15/2020   HDL 36 09/15/2020   LDLCALC 29 09/15/2020   TRIG 265 (A) 09/15/2020   CHOLHDL 4.6 02/16/2020     Assessment/Plan  1. Essential (primary) hypertension -  BPs stable, continue amlodipine 10 mg daily, hydralazine 25 mg every 8 hours and metoprolol tartrate 25 mg twice a day  2. History of CVA with residual deficit -   Not able to move any  4 extremities, continue supportive care -   Stable, continue Plavix 75 mg daily and rosuvastatin 5 mg daily  3. Dysphagia, unspecified type -   Continue Glucerna 1.2 cal 60 mL/hour and water flushes 125 mL every 6 hours -    Aspiration precautions  4. DM (diabetes mellitus), type 2 with peripheral vascular complications [facility]) Lab Results  Component Value Date   HGBA1C 7.5 09/15/2020   -    Continue Levemir 15 units SQ BID  -    CBG checks BID  5. Pressure ulcer of sacral region, stage 3 (HCC) -   Continue treatment with Hydrofera Blue blue and foam dressing daily -     Family/ staff Communication: Discussed plan of care with resident and charge nurse.  Labs/tests ordered: None  Goals of care:   Long-term care   Kenard Gower, DNP, MSN, FNP-BC [facility] [phone] (Monday-Friday 8:00 a.m. - 5:00 p.m.) [phone] (after hours)

## 2020-11-07 ENCOUNTER — Emergency Department (HOSPITAL_COMMUNITY)
Admission: EM | Admit: 2020-11-07 | Discharge: 2020-11-08 | Disposition: A | Discharge status: Home / Self Care | Payer: Self-pay | Attending: Emergency Medicine | Admitting: Emergency Medicine

## 2020-11-07 DIAGNOSIS — I10 Essential (primary) hypertension: Secondary | ICD-10-CM | POA: Insufficient documentation

## 2020-11-07 DIAGNOSIS — F0390 Unspecified dementia without behavioral disturbance: Secondary | ICD-10-CM | POA: Insufficient documentation

## 2020-11-07 DIAGNOSIS — Z794 Long term (current) use of insulin: Secondary | ICD-10-CM | POA: Insufficient documentation

## 2020-11-07 DIAGNOSIS — Z431 Encounter for attention to gastrostomy: Secondary | ICD-10-CM

## 2020-11-07 DIAGNOSIS — Z79899 Other long term (current) drug therapy: Secondary | ICD-10-CM | POA: Insufficient documentation

## 2020-11-07 DIAGNOSIS — K9423 Gastrostomy malfunction: Secondary | ICD-10-CM | POA: Insufficient documentation

## 2020-11-07 DIAGNOSIS — E119 Type 2 diabetes mellitus without complications: Secondary | ICD-10-CM | POA: Insufficient documentation

## 2020-11-07 NOTE — Discharge Instructions (Addendum)
Please follow up with interventional radiology if you have further problems with the G tube

## 2020-11-07 NOTE — ED Notes (Signed)
Report given to Cassandra at [facility].

## 2020-11-07 NOTE — ED Notes (Signed)
Brief changed and patient cleaned up

## 2020-11-07 NOTE — ED Notes (Signed)
Called [facility] to transport patient to [place]. States that is just one ahead of patient.

## 2020-11-07 NOTE — ED Provider Notes (Signed)
[facility] EMERGENCY DEPARTMENT Provider Note   CSN: [id number] Arrival date & time: 11/07/20  1846     History Chief Complaint  Patient presents with  . GI Problem    Tasha [name] is a 75 y.o. female who presents emergency department brought in by EMS for G-tube malfunction.  She is nonverbal with a history of dementia and is fed her medications and her food through the G-tube.  Apparently today someone was trying to flush the line when the end of the G-tube popped and broke.  I discussed this with the patient's son who is her legal guardian.  HPI     Past Medical History:  Diagnosis Date  . 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 Essential (primary) hypertension   . History of falling   . MI (myocardial infarction) (HCC)   . Stroke [facility])     Patient Active Problem List   Diagnosis Date Noted  . Cellulitis of trunk 10/05/2020  . Breast mass in female 10/05/2020  . Pressure injury of skin 04/25/2020  . Dysphagia   . Pneumonia of both lungs due to infectious organism   . Advanced care planning/counseling discussion   . Goals of care, counseling/discussion   . Palliative care by specialist   . Palliative care encounter   . Hyperkalemia   . Sepsis (HCC)   . Coffee ground emesis   . Lactic acidosis 03/08/2020  . Essential (primary) hypertension   . History of falling   . Nausea and vomiting   . AKI (acute kidney injury) (HCC)   . DM (diabetes mellitus), type 2 with peripheral vascular complications (HCC) 02/20/2020  . Atherosclerosis of aorta (HCC) 02/20/2020  . Poorly-controlled hypertension 02/20/2020  . Dyslipidemia 02/20/2020  . Fall 02/15/2020  . CVA (cerebral vascular accident) (HCC) 02/15/2020    Past Surgical History:  Procedure Laterality Date  . ABDOMINAL HYSTERECTOMY    . APPENDECTOMY    . BACK SURGERY    . IR CM INJ ANY COLONIC TUBE W/FLUORO  09/27/2020  . IR GASTROSTOMY TUBE MOD SED  03/31/2020      OB History   No obstetric history on file.     Family History  Adopted: Yes  Family history unknown: Yes    Social History   Tobacco Use  . Smoking status: Never Smoker  . Smokeless tobacco: Never Used  Vaping Use  . Vaping Use: Never used  Substance Use Topics  . Alcohol use: Yes    Comment: occasionally  . Drug use: Never    Home Medications Prior to Admission medications   Medication Sig Start Date End Date Taking? Authorizing Provider  amLODipine (NORVASC) 10 MG tablet Place 10 mg into feeding tube daily.    [provider]  bisacodyl (DULCOLAX) 10 MG suppository Place 10 mg rectally as needed for moderate constipation. If MOM doesn't work    [provider]  clopidogrel (PLAVIX) 75 MG tablet Place 1 tablet (75 mg total) into feeding tube daily. 04/27/20   Kathlen Mody, MD  hydrALAZINE (APRESOLINE) 25 MG tablet Place 25 mg into feeding tube every 8 (eight) hours.    [provider]  insulin detemir (LEVEMIR) 100 UNIT/ML injection Inject 15 Units into the skin 2 (two) times daily.    [provider]  metoprolol tartrate (LOPRESSOR) 25 MG tablet Place 25 mg into feeding tube 2 (two) times daily.    [provider]  Nutritional Supplements (FEEDING SUPPLEMENT, GLUCERNA 1.2 CAL,)  LIQD Place into feeding tube every 6 (six) hours. Place at 60 ml/hr via PEG. Water flush to 125 ml Q6h.    [provider]  pantoprazole sodium (PROTONIX) 40 mg/20 mL PACK Place 20 mLs (40 mg total) into feeding tube daily. 04/27/20   Kathlen Mody, MD  polyethylene glycol (MIRALAX / GLYCOLAX) 17 g packet Place 17 g into feeding tube daily as needed for mild constipation. 04/27/20   Kathlen Mody, MD  rosuvastatin (CRESTOR) 5 MG tablet Place 1 tablet (5 mg total) into feeding tube daily. 04/27/20   Kathlen Mody, MD    Allergies    Allopurinol, Tetracycline, Amoxicillin-pot clavulanate, Atorvastatin, Azithromycin, Codeine, Lisinopril, and Sulfa  antibiotics  Review of Systems   Review of Systems unable to review systems as patient is nonverbal Physical Exam Updated Vital Signs BP (!) 155/69 (BP Location: Right Arm) Comment: Simultaneous filing. User may not have seen previous data. Comment (BP Location): Simultaneous filing. User may not have seen previous data.  Pulse 70 Comment: Simultaneous filing. User may not have seen previous data.  Temp 98.1 F (36.7 C) (Oral) Comment: Simultaneous filing. User may not have seen previous data. Comment (Src): Simultaneous filing. User may not have seen previous data.  Resp (!) 23   SpO2 96% Comment: Simultaneous filing. User may not have seen previous data.  Physical Exam Vitals and nursing note reviewed.  Constitutional:      General: She is not in acute distress.    Appearance: She is well-developed. She is not diaphoretic.  HENT:     Head: Normocephalic and atraumatic.  Eyes:     General: No scleral icterus.    Conjunctiva/sclera: Conjunctivae normal.  Cardiovascular:     Rate and Rhythm: Normal rate and regular rhythm.     Heart sounds: Normal heart sounds. No murmur heard. No friction rub. No gallop.   Pulmonary:     Effort: Pulmonary effort is normal. No respiratory distress.     Breath sounds: Normal breath sounds.  Abdominal:     General: Bowel sounds are normal. There is no distension.     Palpations: Abdomen is soft. There is no mass.     Tenderness: There is no abdominal tenderness. There is no guarding.     Comments: Gtube in place, The distal end of the tube is split at the plastic tubing cap.  Genitourinary:    Comments: indwelling catheter in place Musculoskeletal:     Cervical back: Normal range of motion.     Comments: Hands and feet in mittens and pressure boots  Skin:    General: Skin is warm and dry.  Neurological:     Mental Status: She is alert and oriented to person, place, and time.  Psychiatric:        Behavior: Behavior normal.     ED  Results / Procedures / Treatments   Labs (all labs ordered are listed, but only abnormal results are displayed) Labs Reviewed - No data to display  EKG None  Radiology No results found.  Procedures Procedures Medications Ordered in ED Medications - No data to display  ED Course  I have reviewed the triage vital signs and the nursing notes.  Pertinent labs & imaging results that were available during my care of the patient were reviewed by me and considered in my medical decision making (see chart for details).    MDM Rules/Calculators/A&P  I removed the plastic cap, cut the tubing back to where it is not broken and replace the cap. Patient will be discharged. Legal guardian notified. Final Clinical Impression(s) / ED Diagnoses Final diagnoses:  None    Rx / DC Orders ED Discharge Orders    None       Arthor Captain, PA-C 11/07/20 1920    Gerhard Munch, MD 11/08/20 0001

## 2020-11-07 NOTE — ED Triage Notes (Signed)
Patient brought in via EMS from [facility] for a G-tube replacement due to the tubing being split at the end.

## 2020-11-07 NOTE — ED Notes (Signed)
Attempted to give report to [facility] - no answer.

## 2020-11-24 ENCOUNTER — Non-Acute Institutional Stay (SKILLED_NURSING_FACILITY): Payer: Self-pay | Admitting: Adult Health

## 2020-11-24 ENCOUNTER — Encounter: Payer: Self-pay | Admitting: Adult Health

## 2020-11-24 DIAGNOSIS — I6930 Unspecified sequelae of cerebral infarction: Secondary | ICD-10-CM

## 2020-11-24 DIAGNOSIS — Z7189 Other specified counseling: Secondary | ICD-10-CM

## 2020-11-24 DIAGNOSIS — Z978 Presence of other specified devices: Secondary | ICD-10-CM

## 2020-11-24 DIAGNOSIS — E1151 Type 2 diabetes mellitus with diabetic peripheral angiopathy without gangrene: Secondary | ICD-10-CM

## 2020-11-24 DIAGNOSIS — Z1211 Encounter for screening for malignant neoplasm of colon: Secondary | ICD-10-CM

## 2020-11-24 DIAGNOSIS — Z23 Encounter for immunization: Secondary | ICD-10-CM

## 2020-11-24 NOTE — Progress Notes (Addendum)
Location:  [facility] Room Number: 221 B Place of Service:  SNF (31) Provider:  Kenard Gower, DNP, FNP-BC  Patient Care Team: Pecola Lawless, MD as PCP - General (Internal Medicine) Medina-Vargas, Margit Banda, NP as Nurse Practitioner (Internal Medicine) Rehab, [facility] (Skilled Nursing Facility)  Extended Emergency Contact Information Primary Emergency Contact: ivanka, kirshner Mobile Phone: [phone] Relation: Son Secondary Emergency Contact: Sani, [place]ise[place] Mobile Phone: [phone] Relation: Daughter  Code Status:  Full Code  Goals of care: Advanced Directive information Advanced Directives 10/13/2020  Does Patient Have a Medical Advance Directive? No  Would patient like information on creating a medical advance directive? No - Patient declined     Chief Complaint  Patient presents with  . Acute Visit    Chronic Foley catheter   . Health Maintenance    Foot exam, TDAP, Colon cancer screening, PCV13    HPI:  Pt is a 75 y.o. female seen today for chronic foley catheter care. She has a history of CVA with residual deficit. She is unable to move X 4 extremities. She has restorative program wherein she has bilateral arms splint/brace care. She takes Levemir 15 units SQ BID for diabetes mellitus. Latest hgbA1C 7.5%, 09/15/20. Review of vaccines noted that she is needing Tdap and PCV13. She is needing screening for colon cancer and podiatry consult.  Care plan meeting was attended by MDS coordinator X 2, social worker and NP. Son was called via teleconference but did not answer. Son left message with social worker wanting his mother to be DNR. Discussed medications, vital signs and weights. Sacrum pressure ulcer and left heel now healed.  The meeting lasted for 20 minutes.   Past Medical History:  Diagnosis Date  . 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 Essential (primary) hypertension   . History of  falling   . MI (myocardial infarction) (HCC)   . Stroke [facility])    Past Surgical History:  Procedure Laterality Date  . ABDOMINAL HYSTERECTOMY    . APPENDECTOMY    . BACK SURGERY    . IR CM INJ ANY COLONIC TUBE W/FLUORO  09/27/2020  . IR GASTROSTOMY TUBE MOD SED  03/31/2020    Allergies  Allergen Reactions  . Allopurinol Shortness Of Breath  . Tetracycline Anaphylaxis    She was placed on Doxycycline for truncal cellulitis in January 2022 w/o ADE & with improvement in the cellulitis..  . Amoxicillin-Pot Clavulanate Diarrhea  . Atorvastatin Other (See Comments)    Unknown reaction - reported by [facility], [place] clinic 05/10/2012  . Azithromycin Other (See Comments)    Caused blisters inside and out  . Codeine Other (See Comments)    Altered mental status  . Lisinopril Other (See Comments)    Unknown reaction - reported by [facility], [place] clinic 12/23/2008 02/20/2020 she believes "reaction" was NP cough. No PMH of angioedema  . Sulfa Antibiotics Diarrhea    Outpatient Encounter Medications as of 11/24/2020  Medication Sig  . acetic acid 0.25 % irrigation Irrigate with as directed daily.  Marland Kitchen amLODipine (NORVASC) 10 MG tablet Place 10 mg into feeding tube daily.  . barrier cream (NON-SPECIFIED) CREA Apply 1 application topically 2 (two) times daily as needed.  . bisacodyl (DULCOLAX) 10 MG suppository Place 10 mg rectally as needed for moderate constipation. If MOM doesn't work  . clopidogrel (PLAVIX) 75 MG tablet Place 1 tablet (75 mg total) into feeding tube daily.  . hydrALAZINE (APRESOLINE) 25 MG tablet  Place 25 mg into feeding tube every 8 (eight) hours.  . insulin detemir (LEVEMIR) 100 UNIT/ML injection Inject 15 Units into the skin 2 (two) times daily.  . magnesium hydroxide (MILK OF MAGNESIA) 400 MG/5ML suspension Take 30 mLs by mouth daily as needed for mild constipation.  . metoprolol tartrate (LOPRESSOR) 25 MG tablet Place 25 mg into feeding tube 2 (two) times daily.  . Nutritional  Supplements (FEEDING SUPPLEMENT, GLUCERNA 1.2 CAL,) LIQD Place into feeding tube every 6 (six) hours. Place at 60 ml/hr via PEG. Water flush to 125 ml Q6h.  . pantoprazole sodium (PROTONIX) 40 mg/20 mL PACK Place 20 mLs (40 mg total) into feeding tube daily.  . polyethylene glycol (MIRALAX / GLYCOLAX) 17 g packet Place 17 g into feeding tube daily as needed for mild constipation.  . rosuvastatin (CRESTOR) 5 MG tablet Place 1 tablet (5 mg total) into feeding tube daily.   No facility-administered encounter medications on file as of 11/24/2020.    Review of Systems  Unable to obtain due to nonverbal    Immunization History  Administered Date(s) Administered  . Influenza, High Dose Seasonal PF 05/04/2016  . Influenza-Unspecified 06/17/2020  . PFIZER(Purple Top)SARS-COV-2 Vaccination 10/03/2019, 10/24/2019  . Pneumococcal Polysaccharide-23 08/29/2003, 06/30/2009  . Zoster 01/29/2006   Pertinent  Health Maintenance Due  Topic Date Due  . FOOT EXAM  Never done  . OPHTHALMOLOGY EXAM  Never done  . COLONOSCOPY (Pts 45-91yrs Insurance coverage will need to be confirmed)  Never done  . MAMMOGRAM  Never done  . DEXA SCAN  Never done  . PNA vac Low Risk Adult (1 of 2 - PCV13) 05/04/2011  . HEMOGLOBIN A1C  03/15/2021  . INFLUENZA VACCINE  03/28/2021   No flowsheet data found.   Vitals:   11/24/20 1602  BP: 127/68  Pulse: 95  Resp: 15  Temp: 97.9 F (36.6 C)  Weight: 123 lb 3.2 oz (55.9 kg)  Height: 5\' 1"  (1.549 m)   Body mass index is 23.28 kg/m.  Physical Exam  GENERAL APPEARANCE: Well nourished. In no acute distress.  SKIN:  Skin is warm and dry.  MOUTH and THROAT: Lips are without lesions. Oral mucosa is moist and without lesions.  RESPIRATORY: Breathing is even & unlabored, BS CTAB CARDIAC: RRR, no murmur,no extra heart sounds, no edema GI: +peg tube NEUROLOGICAL: There is no tremor. Nonverbal PSYCHIATRIC:  Affect and behavior are appropriate  Labs reviewed: Recent  Labs    04/04/20 0315 04/05/20 0352 04/05/20 06/05/20 04/06/20 0408 04/07/20 0555 04/09/20 0819 04/10/20 0251 04/22/20 0907 04/23/20 0314 04/26/20 0101 09/15/20 0000  NA 128* 128*  --  137   < > 146*   < > 142 141 138 138  K 4.7 4.2  --  3.6   < > 3.7   < > 4.3 3.9 3.8 3.8  CL 93* 94*  --  102   < > 111   < > 107 107 107 102  CO2 20* 19*  --  21*   < > 24   < > 27 25 21* 24*  GLUCOSE 219* 137*  --  115*   < > 178*   < > 176* 134* 181*  --   BUN 63* 77*  --  65*   < > 39*   < > 44* 36* 22 26*  CREATININE 1.42* 1.87*  --  1.57*   < > 0.99   < > 0.64 0.59 0.56 0.4*  CALCIUM 9.7 9.2  --  9.1   < > 9.2   < > 8.9 8.8* 8.7* 9.6  MG 2.0 2.1  --  2.1  --   --   --   --   --   --   --   PHOS 3.7  --  4.5 4.1  --  3.5  --   --   --   --   --    < > = values in this interval not displayed.   Recent Labs    03/20/20 0216 03/21/20 0415 03/22/20 0306 04/04/20 0315 09/15/20 0000  AST 48* 29 60*  --  10*  ALT 95* 91* 121*  --  13  ALKPHOS 57 56 54  --  82  BILITOT 0.6 0.5 0.6  --   --   PROT 6.8 6.8 6.8  --   --   ALBUMIN 2.6* 2.6* 2.6* 2.5* 3.3*   Recent Labs    04/20/20 0256 04/23/20 0314 04/26/20 0101 09/15/20 0000  WBC 12.1* 9.1 9.6 11.8  NEUTROABS 8.5* 4.9  --  8.70  HGB 8.6* 7.4* 8.3* 9.9*  HCT 28.9* 24.7* 26.1* 30*  MCV 98.0 98.4 97.0  --   PLT 385 312 325 305   Lab Results  Component Value Date   TSH 2.232 03/08/2020   Lab Results  Component Value Date   HGBA1C 7.5 09/15/2020   Lab Results  Component Value Date   CHOL 118 09/15/2020   HDL 36 09/15/2020   LDLCALC 29 09/15/2020   TRIG 265 (A) 09/15/2020   CHOLHDL 4.6 02/16/2020     Assessment/Plan  1. Chronic indwelling Foley catheter -  Will Flush foley catheter 0.25% irrigation solution 60 ml daily -   Catheter care daily  2. Need for prophylactic vaccination with combined diphtheria-tetanus-pertussis (DTP) vaccine -Tdap 0.5 mL IM x1  3. Need for vaccination against Streptococcus pneumoniae using  pneumococcal conjugate vaccine 13 -   PCV13  0.5 ml IM x 1  4. Colon cancer screening -  Stool occult blood X 3  5. DM (diabetes mellitus), type 2 with peripheral vascular complications [facility]) Lab Results  Component Value Date   HGBA1C 7.5 09/15/2020   -  continue Levemir -  Podiatry referral for foot exam  6. History of CVA with residual deficit -    stable, continue rosuvastatin and clopidogrel  7. ACP -   Now a DNR -  Discussed medications, vital signs and weights    Family/ staff Communication: Discussed plan of care with resident and charge nurse.  Labs/tests ordered: None  Goals of care:   Long-term care   Kenard Gower, DNP, MSN, FNP-BC [facility] [phone] (Monday-Friday 8:00 a.m. - 5:00 p.m.) [phone] (after hours)

## 2020-12-03 ENCOUNTER — Encounter: Payer: Self-pay | Admitting: Adult Health

## 2020-12-03 ENCOUNTER — Non-Acute Institutional Stay (SKILLED_NURSING_FACILITY): Payer: Self-pay | Admitting: Adult Health

## 2020-12-03 DIAGNOSIS — R1310 Dysphagia, unspecified: Secondary | ICD-10-CM

## 2020-12-03 DIAGNOSIS — E1151 Type 2 diabetes mellitus with diabetic peripheral angiopathy without gangrene: Secondary | ICD-10-CM

## 2020-12-03 DIAGNOSIS — I10 Essential (primary) hypertension: Secondary | ICD-10-CM

## 2020-12-03 DIAGNOSIS — I6930 Unspecified sequelae of cerebral infarction: Secondary | ICD-10-CM

## 2020-12-03 NOTE — Progress Notes (Signed)
Location:  [facility] Room Number: 221/B Place of Service:  SNF (31) Provider:  Kenard Gower, DNP, FNP-BC  Patient Care Team: Pecola Lawless, MD as PCP - General (Internal Medicine) Medina-Vargas, Margit Banda, NP as Nurse Practitioner (Internal Medicine) Rehab, [facility] (Skilled Nursing Facility)  Extended Emergency Contact Information Primary Emergency Contact: chantil, bari Mobile Phone: [phone] Relation: Son Secondary Emergency Contact: Monchel, Pollitt Mobile Phone: [phone] Relation: Daughter  Code Status:  Full Code  Goals of care: Advanced Directive information Advanced Directives 12/03/2020  Does Patient Have a Medical Advance Directive? No  Would patient like information on creating a medical advance directive? No - Patient declined     Chief Complaint  Patient presents with  . Medical Management of Chronic Issues    Routine Visit of Medical Management     HPI:  Pt is a 75 y.o. female seen today for medical management of chronic diseases. She is a long-term care resident of [facility]. She has a PMH of dyslipidemia, MI and essential hypertension. She takes clopidogrel 75 mg daily and rosuvastatin 5 mg daily for history of CVA.  She is nonverbal and not able to move x4 extremities. She is needing total care assistance. SBPs ranging from 116 to 137, outlier 161 and 170.  She takes hydralazine 25 mg every 8 hours, amlodipine besylate 10 mg 1 tab daily and metoprolol tartrate 25 mg twice a day for hypertension.   Past Medical History:  Diagnosis Date  . 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 Essential (primary) hypertension   . History of falling   . MI (myocardial infarction) (HCC)   . Stroke [facility])    Past Surgical History:  Procedure Laterality Date  . ABDOMINAL HYSTERECTOMY    . APPENDECTOMY    . BACK SURGERY    . IR CM INJ ANY COLONIC TUBE W/FLUORO   09/27/2020  . IR GASTROSTOMY TUBE MOD SED  03/31/2020    Allergies  Allergen Reactions  . Allopurinol Shortness Of Breath  . Tetracycline Anaphylaxis    She was placed on Doxycycline for truncal cellulitis in January 2022 w/o ADE & with improvement in the cellulitis..  . Amoxicillin-Pot Clavulanate Diarrhea  . Atorvastatin Other (See Comments)    Unknown reaction - reported by [facility], [place] clinic 05/10/2012  . Azithromycin Other (See Comments)    Caused blisters inside and out  . Codeine Other (See Comments)    Altered mental status  . Lisinopril Other (See Comments)    Unknown reaction - reported by [facility], [place] clinic 12/23/2008 02/20/2020 she believes "reaction" was NP cough. No PMH of angioedema  . Sulfa Antibiotics Diarrhea    Outpatient Encounter Medications as of 12/03/2020  Medication Sig  . acetic acid 0.25 % irrigation Apply topically. IRRIGATE FOLEY CATHETHER WITH (FLUSH ONCE DAILY)  . amLODipine (NORVASC) 10 MG tablet Place 10 mg into feeding tube daily.  . barrier cream (NON-SPECIFIED) CREA Apply 1 application topically 2 (two) times daily as needed.  . bisacodyl (DULCOLAX) 10 MG suppository Place 10 mg rectally as needed for moderate constipation. If MOM doesn't work  . clopidogrel (PLAVIX) 75 MG tablet Place 1 tablet (75 mg total) into feeding tube daily.  . hydrALAZINE (APRESOLINE) 25 MG tablet Place 25 mg into feeding tube every 8 (eight) hours.  . insulin detemir (LEVEMIR) 100 UNIT/ML injection Inject 15 Units into the skin 2 (two) times daily.  . magnesium hydroxide (MILK OF MAGNESIA)  400 MG/5ML suspension Take 30 mLs by mouth daily as needed for mild constipation.  . metoprolol tartrate (LOPRESSOR) 25 MG tablet Place 25 mg into feeding tube 2 (two) times daily.  . Nutritional Supplements (FEEDING SUPPLEMENT, GLUCERNA 1.2 CAL,) LIQD Place into feeding tube every 6 (six) hours. Place at 60 ml/hr via PEG. Water flush to 125 ml Q6h.  . pantoprazole sodium (PROTONIX)  40 mg/20 mL PACK Place 20 mLs (40 mg total) into feeding tube daily.  . polyethylene glycol (MIRALAX / GLYCOLAX) 17 g packet Place 17 g into feeding tube daily as needed for mild constipation.  . rosuvastatin (CRESTOR) 5 MG tablet Place 1 tablet (5 mg total) into feeding tube daily.   No facility-administered encounter medications on file as of 12/03/2020.    Review of Systems unable to obtain due to nonverbal.    Immunization History  Administered Date(s) Administered  . Influenza, High Dose Seasonal PF 05/04/2016  . Influenza-Unspecified 06/17/2020  . PFIZER(Purple Top)SARS-COV-2 Vaccination 10/03/2019, 10/24/2019  . Pneumococcal Conjugate-13 09/01/2020  . Pneumococcal Polysaccharide-23 08/29/2003, 06/30/2009  . Unspecified SARS-COV-2 Vaccination 08/31/2020  . Zoster 01/29/2006   Pertinent  Health Maintenance Due  Topic Date Due  . FOOT EXAM  Never done  . OPHTHALMOLOGY EXAM  Never done  . COLONOSCOPY (Pts 45-36yrs Insurance coverage will need to be confirmed)  Never done  . MAMMOGRAM  Never done  . DEXA SCAN  Never done  . HEMOGLOBIN A1C  03/15/2021  . INFLUENZA VACCINE  03/28/2021  . PNA vac Low Risk Adult (2 of 2 - PPSV23) 09/01/2021   No flowsheet data found.   Vitals:   12/03/20 1000  BP: 135/72  Pulse: 77  Resp: 18  Temp: (!) 97.5 F (36.4 C)  Weight: 118 lb 6.4 oz (53.7 kg)  Height: 5\' 1"  (1.549 m)   Body mass index is 22.37 kg/m.  Physical Exam  GENERAL APPEARANCE: Well nourished. In no acute distress. Normal body habitus SKIN:  Skin is warm and dry.  MOUTH and THROAT: Lips are without lesions. Oral mucosa is moist and without lesions.  RESPIRATORY: Breathing is even & unlabored, BS CTAB CARDIAC: RRR, no murmur,no extra heart sounds, no edema GI: +peg tube GU:  +foley catheter french 20 draining to urine bag with clear yellowish urine NEUROLOGICAL: There is no tremor.  Nonverbal.  Unable to move x4 extremities PSYCHIATRIC:  Affect and behavior are  appropriate  Labs reviewed: Recent Labs    04/04/20 0315 04/05/20 0352 04/05/20 0942 04/06/20 0408 04/07/20 0555 04/09/20 0819 04/10/20 0251 04/22/20 0907 04/23/20 0314 04/26/20 0101 09/15/20 0000  NA 128* 128*  --  137   < > 146*   < > 142 141 138 138  K 4.7 4.2  --  3.6   < > 3.7   < > 4.3 3.9 3.8 3.8  CL 93* 94*  --  102   < > 111   < > 107 107 107 102  CO2 20* 19*  --  21*   < > 24   < > 27 25 21* 24*  GLUCOSE 219* 137*  --  115*   < > 178*   < > 176* 134* 181*  --   BUN 63* 77*  --  65*   < > 39*   < > 44* 36* 22 26*  CREATININE 1.42* 1.87*  --  1.57*   < > 0.99   < > 0.64 0.59 0.56 0.4*  CALCIUM 9.7 9.2  --  9.1   < > 9.2   < > 8.9 8.8* 8.7* 9.6  MG 2.0 2.1  --  2.1  --   --   --   --   --   --   --   PHOS 3.7  --  4.5 4.1  --  3.5  --   --   --   --   --    < > = values in this interval not displayed.   Recent Labs    03/20/20 0216 03/21/20 0415 03/22/20 0306 04/04/20 0315 09/15/20 0000  AST 48* 29 60*  --  10*  ALT 95* 91* 121*  --  13  ALKPHOS 57 56 54  --  82  BILITOT 0.6 0.5 0.6  --   --   PROT 6.8 6.8 6.8  --   --   ALBUMIN 2.6* 2.6* 2.6* 2.5* 3.3*   Recent Labs    04/20/20 0256 04/23/20 0314 04/26/20 0101 09/15/20 0000  WBC 12.1* 9.1 9.6 11.8  NEUTROABS 8.5* 4.9  --  8.70  HGB 8.6* 7.4* 8.3* 9.9*  HCT 28.9* 24.7* 26.1* 30*  MCV 98.0 98.4 97.0  --   PLT 385 312 325 305   Lab Results  Component Value Date   TSH 2.232 03/08/2020   Lab Results  Component Value Date   HGBA1C 7.5 09/15/2020   Lab Results  Component Value Date   CHOL 118 09/15/2020   HDL 36 09/15/2020   LDLCALC 29 09/15/2020   TRIG 265 (A) 09/15/2020   CHOLHDL 4.6 02/16/2020     Assessment/Plan  1. History of CVA with residual deficit -  Unable to move X 4 extremities, needing total care -   Continue clopidogrel and rosuvastatin  2. DM (diabetes mellitus), type 2 with peripheral vascular complications (HCC) Lab Results  Component Value Date   HGBA1C 7.5  09/15/2020   -   CBGs stable, continue Levemir 15 units twice a day  3. Essential (primary) hypertension -   Stable, metoprolol tartrate, hydralazine and amlodipine besylate -   Monitor CBGs  4. Dysphagia, unspecified type -   Continue PEG tube feeding Glucerna 1.2 call at 60 mL/hour -  Aspiration precautions     Family/ staff Communication:   Discussed plan of care with charge nurse.  Labs/tests ordered:   None  Goals of care:   Long-term care/palliative care   Kenard Gower, DNP, MSN, FNP-BC [facility] and Adult Medicine [phone] (Monday-Friday 8:00 a.m. - 5:00 p.m.) [phone] (after hours)

## 2020-12-12 ENCOUNTER — Encounter: Payer: Self-pay | Admitting: Adult Health

## 2020-12-14 ENCOUNTER — Non-Acute Institutional Stay (SKILLED_NURSING_FACILITY): Payer: Self-pay | Admitting: Adult Health

## 2020-12-14 ENCOUNTER — Encounter: Payer: Self-pay | Admitting: Adult Health

## 2020-12-14 DIAGNOSIS — B373 Candidiasis of vulva and vagina: Secondary | ICD-10-CM

## 2020-12-14 DIAGNOSIS — E1151 Type 2 diabetes mellitus with diabetic peripheral angiopathy without gangrene: Secondary | ICD-10-CM

## 2020-12-14 DIAGNOSIS — B3731 Acute candidiasis of vulva and vagina: Secondary | ICD-10-CM

## 2020-12-14 NOTE — Progress Notes (Signed)
Location:  [facility] Room Number: 221 B Place of Service:  SNF (31) Provider:  Kenard Gower, DNP, FNP-BC  Patient Care Team: Pecola Lawless, MD as PCP - General (Internal Medicine) Medina-Vargas, Margit Banda, NP as Nurse Practitioner (Internal Medicine) Rehab, [facility] (Skilled Nursing Facility)  Extended Emergency Contact Information Primary Emergency Contact: marelin, tat Mobile Phone: [phone] Relation: Son Secondary Emergency Contact: Everlynn, Sagun Mobile Phone: [phone] Relation: Daughter  Code Status:   Full Code  Goals of care: Advanced Directive information Advanced Directives 12/03/2020  Does Patient Have a Medical Advance Directive? No  Would patient like information on creating a medical advance directive? No - Patient declined     Chief Complaint  Patient presents with  . Acute Visit    Vaginal discharge    HPI:  Pt is a 74 y.o. female seen today for whitish vaginal discharge, She is a long-term care resident followed by palliative care. She was seen today and noted to have moderate amount whitish vaginal discharge. No erythema nor bleeding noted. 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 Her CBGs ranging from 150 to 335. She takes Levemir 15 units for diabetes mellitus.   Past Medical History:  Diagnosis Date  . 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 Essential (primary) hypertension   . History of falling   . MI (myocardial infarction) (HCC)   . Stroke [facility])    Past Surgical History:  Procedure Laterality Date  . ABDOMINAL HYSTERECTOMY    . APPENDECTOMY    . BACK SURGERY    . IR CM INJ ANY COLONIC TUBE W/FLUORO  09/27/2020  . IR GASTROSTOMY TUBE MOD SED  03/31/2020    Allergies  Allergen Reactions  . Allopurinol Shortness Of Breath  . Tetracycline Anaphylaxis    She was placed on Doxycycline for truncal cellulitis in  January 2022 w/o ADE & with improvement in the cellulitis..  . Amoxicillin-Pot Clavulanate Diarrhea  . Atorvastatin Other (See Comments)    Unknown reaction - reported by [facility], [place] clinic 05/10/2012  . Azithromycin Other (See Comments)    Caused blisters inside and out  . Codeine Other (See Comments)    Altered mental status  . Lisinopril Other (See Comments)    Unknown reaction - reported by [facility], [place] clinic 12/23/2008 02/20/2020 she believes "reaction" was NP cough. No PMH of angioedema  . Sulfa Antibiotics Diarrhea    Outpatient Encounter Medications as of 12/14/2020  Medication Sig  . acetic acid 0.25 % irrigation Apply topically. IRRIGATE FOLEY CATHETHER WITH (FLUSH ONCE DAILY)  . amLODipine (NORVASC) 10 MG tablet Place 10 mg into feeding tube daily.  . barrier cream (NON-SPECIFIED) CREA Apply 1 application topically 2 (two) times daily as needed.  . bisacodyl (DULCOLAX) 10 MG suppository Place 10 mg rectally as needed for moderate constipation. If MOM doesn't work  . clopidogrel (PLAVIX) 75 MG tablet Place 1 tablet (75 mg total) into feeding tube daily.  . hydrALAZINE (APRESOLINE) 25 MG tablet Place 25 mg into feeding tube every 8 (eight) hours.  . insulin detemir (LEVEMIR) 100 UNIT/ML injection Inject 15 Units into the skin 2 (two) times daily.  . magnesium hydroxide (MILK OF MAGNESIA) 400 MG/5ML suspension Take 30 mLs by mouth daily as needed for mild constipation.  . metoprolol tartrate (LOPRESSOR) 25 MG tablet Place 25 mg into feeding tube 2 (two) times daily.  Marland Kitchen  Nutritional Supplements (FEEDING SUPPLEMENT, GLUCERNA 1.2 CAL,) LIQD Place into feeding tube every 6 (six) hours. Place at 60 ml/hr via PEG. Water flush to 125 ml Q6h.  . pantoprazole sodium (PROTONIX) 40 mg/20 mL PACK Place 20 mLs (40 mg total) into feeding tube daily.  . polyethylene glycol (MIRALAX / GLYCOLAX) 17 g packet Place 17 g into feeding tube daily as needed for mild constipation.  . rosuvastatin  (CRESTOR) 5 MG tablet Place 1 tablet (5 mg total) into feeding tube daily.   No facility-administered encounter medications on file as of 12/14/2020.    Review of Systems Unable to obtain due to aphasia.    Immunization History  Administered Date(s) Administered  . Influenza, High Dose Seasonal PF 05/04/2016  . Influenza-Unspecified 06/17/2020  . PFIZER(Purple Top)SARS-COV-2 Vaccination 10/03/2019, 10/24/2019  . Pneumococcal Conjugate-13 09/01/2020  . Pneumococcal Polysaccharide-23 08/29/2003, 06/30/2009  . Unspecified SARS-COV-2 Vaccination 08/31/2020  . Zoster 01/29/2006   Pertinent  Health Maintenance Due  Topic Date Due  . FOOT EXAM  Never done  . OPHTHALMOLOGY EXAM  Never done  . COLONOSCOPY (Pts 45-33yrs Insurance coverage will need to be confirmed)  Never done  . MAMMOGRAM  Never done  . DEXA SCAN  Never done  . HEMOGLOBIN A1C  03/15/2021  . INFLUENZA VACCINE  03/28/2021  . PNA vac Low Risk Adult (2 of 2 - PPSV23) 09/01/2021   No flowsheet data found.   Vitals:   12/14/20 1100  BP: 129/79  Pulse: 73  Resp: 18  Temp: 97.6 F (36.4 C)  Weight: 118 lb 6.4 oz (53.7 kg)  Height: 5\' 1"  (1.549 m)   Body mass index is 22.37 kg/m.  Physical Exam  GENERAL APPEARANCE: Well nourished. In no acute distress. Normal body habitus SKIN:  Skin is warm and dry.  MOUTH and THROAT: Lips are without lesions. Oral mucosa is moist and without lesions.  RESPIRATORY: Breathing is even & unlabored, BS CTAB CARDIAC: RRR, no murmur,no extra heart sounds, no edema GI: +peg tube GU:  Moderate whitish vaginal discharge, no vaginal erythema NEUROLOGICAL: There is no tremor. Aphasic. PSYCHIATRIC:  Affect and behavior are appropriate  Labs reviewed: Recent Labs    04/04/20 0315 04/05/20 0352 04/05/20 06/05/20 04/06/20 0408 04/07/20 0555 04/09/20 0819 04/10/20 0251 04/22/20 0907 04/23/20 0314 04/26/20 0101 09/15/20 0000  NA 128* 128*  --  137   < > 146*   < > 142 141 138 138  K  4.7 4.2  --  3.6   < > 3.7   < > 4.3 3.9 3.8 3.8  CL 93* 94*  --  102   < > 111   < > 107 107 107 102  CO2 20* 19*  --  21*   < > 24   < > 27 25 21* 24*  GLUCOSE 219* 137*  --  115*   < > 178*   < > 176* 134* 181*  --   BUN 63* 77*  --  65*   < > 39*   < > 44* 36* 22 26*  CREATININE 1.42* 1.87*  --  1.57*   < > 0.99   < > 0.64 0.59 0.56 0.4*  CALCIUM 9.7 9.2  --  9.1   < > 9.2   < > 8.9 8.8* 8.7* 9.6  MG 2.0 2.1  --  2.1  --   --   --   --   --   --   --   PHOS 3.7  --  4.5 4.1  --  3.5  --   --   --   --   --    < > = values in this interval not displayed.   Recent Labs    03/20/20 0216 03/21/20 0415 03/22/20 0306 04/04/20 0315 09/15/20 0000  AST 48* 29 60*  --  10*  ALT 95* 91* 121*  --  13  ALKPHOS 57 56 54  --  82  BILITOT 0.6 0.5 0.6  --   --   PROT 6.8 6.8 6.8  --   --   ALBUMIN 2.6* 2.6* 2.6* 2.5* 3.3*   Recent Labs    04/20/20 0256 04/23/20 0314 04/26/20 0101 09/15/20 0000  WBC 12.1* 9.1 9.6 11.8  NEUTROABS 8.5* 4.9  --  8.70  HGB 8.6* 7.4* 8.3* 9.9*  HCT 28.9* 24.7* 26.1* 30*  MCV 98.0 98.4 97.0  --   PLT 385 312 325 305   Lab Results  Component Value Date   TSH 2.232 03/08/2020   Lab Results  Component Value Date   HGBA1C 7.5 09/15/2020   Lab Results  Component Value Date   CHOL 118 09/15/2020   HDL 36 09/15/2020   LDLCALC 29 09/15/2020   TRIG 265 (A) 09/15/2020   CHOLHDL 4.6 02/16/2020      Assessment/Plan  1. Vaginal yeast infection - will start Monistat 1.2 g suppository insert vaginally x1 -Perineal care daily  2. DM (diabetes mellitus), type 2 with peripheral vascular complications (HCC) Lab Results  Component Value Date   HGBA1C 7.5 09/15/2020   -   Continue Levemir 15 units twice a day -    Continue CBG checks   Family/ staff Communication: Discussed plan of care with charge nurse.  Labs/tests ordered: None  Goals of care:   Long-term care/palliative care   Kenard Gower, DNP, MSN, FNP-BC [facility] and  Adult Medicine [phone] (Monday-Friday 8:00 a.m. - 5:00 p.m.) [phone] (after hours)

## 2020-12-15 ENCOUNTER — Encounter: Payer: Medicare Other | Admitting: Internal Medicine

## 2020-12-17 ENCOUNTER — Non-Acute Institutional Stay (SKILLED_NURSING_FACILITY): Payer: Self-pay | Admitting: Adult Health

## 2020-12-17 ENCOUNTER — Encounter: Payer: Self-pay | Admitting: Adult Health

## 2020-12-17 DIAGNOSIS — R1310 Dysphagia, unspecified: Secondary | ICD-10-CM

## 2020-12-17 DIAGNOSIS — I6930 Unspecified sequelae of cerebral infarction: Secondary | ICD-10-CM

## 2020-12-17 DIAGNOSIS — S0081XA Abrasion of other part of head, initial encounter: Secondary | ICD-10-CM

## 2020-12-17 NOTE — Progress Notes (Signed)
Location:  [facility] Room Number: 221/B Place of Service:  SNF (31) Provider:  Kenard Gower, DNP, FNP-BC  Patient Care Team: Pecola Lawless, MD as PCP - General (Internal Medicine) Medina-Vargas, Margit Banda, NP as Nurse Practitioner (Internal Medicine) Rehab, [facility] ([facility] Nursing Facility)  Extended Emergency Contact Information Primary Emergency Contact: ricca, melgarejo Mobile Phone: [phone] Relation: Son Secondary Emergency Contact: Fatemah, Pourciau Mobile Phone: [phone] Relation: Daughter  Code Status:  Full Code  Goals of care: Advanced Directive information Advanced Directives 12/17/2020  Does Patient Have a Medical Advance Directive? No  Would patient like information on creating a medical advance directive? No - Patient declined     Chief Complaint  Patient presents with  . Acute Visit    Blister on left eye    HPI:  Pt is a 75 y.o. female seen today for medical for a reported blister under left eye. She was seen today in her room with DON. Noted to have superficial abrasion with dry blood under left eye. No bruising was noted. Resident is nonverbal and not able to tell what had happened. There was no reported fall incident. No blister, crusting, fever nor eye drainage noted. She has a sacral wound that has healed already. She has a history of stroke and not able to move X 4 extremities. She has dysphagia necessitating her to have  Peg tube feeding. She is a long-term care resident of [facility].   Past Medical History:  Diagnosis Date  . 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 Essential (primary) hypertension   . History of falling   . MI (myocardial infarction) (HCC)   . Stroke [facility])    Past Surgical History:  Procedure Laterality Date  . ABDOMINAL HYSTERECTOMY    . APPENDECTOMY    . BACK SURGERY    . IR CM INJ ANY COLONIC TUBE W/FLUORO  09/27/2020  .  IR GASTROSTOMY TUBE MOD SED  03/31/2020    Allergies  Allergen Reactions  . Allopurinol Shortness Of Breath  . Tetracycline Anaphylaxis    She was placed on Doxycycline for truncal cellulitis in January 2022 w/o ADE & with improvement in the cellulitis..  . Amoxicillin-Pot Clavulanate Diarrhea  . Atorvastatin Other (See Comments)    Unknown reaction - reported by [facility], [place] clinic 05/10/2012  . Azithromycin Other (See Comments)    Caused blisters inside and out  . Codeine Other (See Comments)    Altered mental status  . Lisinopril Other (See Comments)    Unknown reaction - reported by [facility], [place] clinic 12/23/2008 02/20/2020 she believes "reaction" was NP cough. No PMH of angioedema  . Sulfa Antibiotics Diarrhea    Outpatient Encounter Medications as of 12/17/2020  Medication Sig  . amLODipine (NORVASC) 10 MG tablet Place 10 mg into feeding tube daily.  . barrier cream (NON-SPECIFIED) CREA Apply 1 application topically 2 (two) times daily as needed.  . bisacodyl (DULCOLAX) 10 MG suppository Place 10 mg rectally as needed for moderate constipation. If MOM doesn't work  . clopidogrel (PLAVIX) 75 MG tablet Place 1 tablet (75 mg total) into feeding tube daily.  . hydrALAZINE (APRESOLINE) 25 MG tablet Place 25 mg into feeding tube every 8 (eight) hours.  . insulin detemir (LEVEMIR) 100 UNIT/ML injection Inject 15 Units into the skin 2 (two) times daily.  . magnesium hydroxide (MILK OF MAGNESIA) 400 MG/5ML suspension Take 30 mLs by mouth daily as needed for mild constipation.  Marland Kitchen  metoprolol tartrate (LOPRESSOR) 25 MG tablet Place 25 mg into feeding tube 2 (two) times daily.  . Nutritional Supplements (FEEDING SUPPLEMENT, GLUCERNA 1.2 CAL,) LIQD Place into feeding tube every 6 (six) hours. Place at 60 ml/hr via PEG. Water flush to 125 ml Q6h.  . pantoprazole sodium (PROTONIX) 40 mg/20 mL PACK Place 20 mLs (40 mg total) into feeding tube daily.  . polyethylene glycol (MIRALAX / GLYCOLAX) 17 g  packet Place 17 g into feeding tube daily as needed for mild constipation.  . rosuvastatin (CRESTOR) 5 MG tablet Place 1 tablet (5 mg total) into feeding tube daily.  . [DISCONTINUED] acetic acid 0.25 % irrigation Apply topically. IRRIGATE FOLEY CATHETHER WITH (FLUSH ONCE DAILY)   No facility-administered encounter medications on file as of 12/17/2020.    Review of Systems  Unable to obtain due to being nonverbal    Immunization History  Administered Date(s) Administered  . Influenza, High Dose Seasonal PF 05/04/2016  . Influenza-Unspecified 06/17/2020  . PFIZER(Purple Top)SARS-COV-2 Vaccination 10/03/2019, 10/24/2019  . Pneumococcal Conjugate-13 09/01/2020  . Pneumococcal Polysaccharide-23 08/29/2003, 06/30/2009  . Unspecified SARS-COV-2 Vaccination 08/31/2020  . Zoster 01/29/2006   Pertinent  Health Maintenance Due  Topic Date Due  . FOOT EXAM  Never done  . OPHTHALMOLOGY EXAM  Never done  . COLONOSCOPY (Pts 45-4yrs Insurance coverage will need to be confirmed)  Never done  . MAMMOGRAM  Never done  . DEXA SCAN  Never done  . HEMOGLOBIN A1C  03/15/2021  . INFLUENZA VACCINE  03/28/2021  . PNA vac Low Risk Adult (2 of 2 - PPSV23) 09/01/2021   No flowsheet data found.   Vitals:   12/17/20 1012  BP: 126/86  Pulse: 72  Resp: 16  Temp: (!) 97.5 F (36.4 C)  Weight: 118 lb 6.4 oz (53.7 kg)  Height: 5\' 1"  (1.549 m)   Body mass index is 22.37 kg/m.  Physical Exam  GENERAL APPEARANCE: Well nourished. In no acute distress. Normal body habitus SKIN:  See HPI MOUTH and THROAT: Lips are without lesions. Oral mucosa is moist and without lesions.  RESPIRATORY: Breathing is even & unlabored, BS CTAB CARDIAC: RRR, no murmur,no extra heart sounds, no edema GI: Abdomen soft, normal BS, no masses, no tenderness NEUROLOGICAL: There is no tremor. Nonverbal PSYCHIATRIC:  Affect and behavior are appropriate  Labs reviewed: Recent Labs    04/04/20 0315 04/05/20 0352  04/05/20 06/05/20 04/06/20 0408 04/07/20 0555 04/09/20 0819 04/10/20 0251 04/22/20 0907 04/23/20 0314 04/26/20 0101 09/15/20 0000  NA 128* 128*  --  137   < > 146*   < > 142 141 138 138  K 4.7 4.2  --  3.6   < > 3.7   < > 4.3 3.9 3.8 3.8  CL 93* 94*  --  102   < > 111   < > 107 107 107 102  CO2 20* 19*  --  21*   < > 24   < > 27 25 21* 24*  GLUCOSE 219* 137*  --  115*   < > 178*   < > 176* 134* 181*  --   BUN 63* 77*  --  65*   < > 39*   < > 44* 36* 22 26*  CREATININE 1.42* 1.87*  --  1.57*   < > 0.99   < > 0.64 0.59 0.56 0.4*  CALCIUM 9.7 9.2  --  9.1   < > 9.2   < > 8.9 8.8* 8.7* 9.6  MG 2.0 2.1  --  2.1  --   --   --   --   --   --   --   PHOS 3.7  --  4.5 4.1  --  3.5  --   --   --   --   --    < > = values in this interval not displayed.   Recent Labs    03/20/20 0216 03/21/20 0415 03/22/20 0306 04/04/20 0315 09/15/20 0000  AST 48* 29 60*  --  10*  ALT 95* 91* 121*  --  13  ALKPHOS 57 56 54  --  82  BILITOT 0.6 0.5 0.6  --   --   PROT 6.8 6.8 6.8  --   --   ALBUMIN 2.6* 2.6* 2.6* 2.5* 3.3*   Recent Labs    04/20/20 0256 04/23/20 0314 04/26/20 0101 09/15/20 0000  WBC 12.1* 9.1 9.6 11.8  NEUTROABS 8.5* 4.9  --  8.70  HGB 8.6* 7.4* 8.3* 9.9*  HCT 28.9* 24.7* 26.1* 30*  MCV 98.0 98.4 97.0  --   PLT 385 312 325 305   Lab Results  Component Value Date   TSH 2.232 03/08/2020   Lab Results  Component Value Date   HGBA1C 7.5 09/15/2020   Lab Results  Component Value Date   CHOL 118 09/15/2020   HDL 36 09/15/2020   LDLCALC 29 09/15/2020   TRIG 265 (A) 09/15/2020   CHOLHDL 4.6 02/16/2020     Assessment/Plan  1. Abrasion of face, initial encounter -  Keep skin clean and dry -  Monitor for infection  2. History of CVA with residual deficit -  Stable, continue Plavix 75 mg daily and rosuvastatin 5 mg daily  3.  Dysphagia -  Continue Glucerna 1.2 Cal at 6 PM Hello/hour via PEG tube -   Aspiration precautions   Family/ staff Communication: Discussed  plan of care with charge nurse.  Labs/tests ordered: None  Goals of care:   Long-term care   Kenard Gower, DNP, MSN, FNP-BC [facility] and Adult Medicine [phone] (Monday-Friday 8:00 a.m. - 5:00 p.m.) [phone] (after hours)

## 2020-12-28 ENCOUNTER — Other Ambulatory Visit: Payer: Self-pay

## 2020-12-28 ENCOUNTER — Non-Acute Institutional Stay: Payer: Self-pay | Admitting: Hospice

## 2020-12-28 ENCOUNTER — Non-Acute Institutional Stay (SKILLED_NURSING_FACILITY): Payer: Medicare Other | Admitting: Internal Medicine

## 2020-12-28 DIAGNOSIS — N179 Acute kidney failure, unspecified: Secondary | ICD-10-CM

## 2020-12-28 DIAGNOSIS — I6300 Cerebral infarction due to thrombosis of unspecified precerebral artery: Secondary | ICD-10-CM

## 2020-12-28 DIAGNOSIS — E1151 Type 2 diabetes mellitus with diabetic peripheral angiopathy without gangrene: Secondary | ICD-10-CM

## 2020-12-28 DIAGNOSIS — I10 Essential (primary) hypertension: Secondary | ICD-10-CM

## 2020-12-28 DIAGNOSIS — R1312 Dysphagia, oropharyngeal phase: Secondary | ICD-10-CM

## 2020-12-28 DIAGNOSIS — F0390 Unspecified dementia without behavioral disturbance: Secondary | ICD-10-CM

## 2020-12-28 DIAGNOSIS — L89890 Pressure ulcer of other site, unstageable: Secondary | ICD-10-CM

## 2020-12-28 DIAGNOSIS — Z515 Encounter for palliative care: Secondary | ICD-10-CM

## 2020-12-28 NOTE — Patient Instructions (Signed)
See assessment and plan under each diagnosis in the problem list and acutely for this visit 

## 2020-12-28 NOTE — Assessment & Plan Note (Addendum)
Wounds over the left medial ankle and the right lateral foot are dressed. SNF Wound Care Nurse continues to monitor.

## 2020-12-28 NOTE — Progress Notes (Signed)
   NURSING HOME LOCATION:  [facility]  ROOM NUMBER:  221 A  CODE STATUS:  Full Code  PCP:  Douglass Rivers MD  This is a nursing facility follow up visit for specific acute issue of poorly controlled DM.  Interim medical record and care since last SNF visit was updated with review of diagnostic studies and change in clinical status since last visit were documented.  HPI: She is a permanent resident of facility, admitted 04/27/2020 following hospitalization for acute encephalopathy associated with intractable nausea and vomiting in context of AKI and lactic acidosis.  MRI documented acute left pontine infarct and acute small infarct in the posterior right lateral ventricle.  She received DAPT for 3 weeks.  The stroke was complicated by severe dysphagia and aspiration pneumonia on 2 occasions.  PEG placement was required to address recurrent aspiration. The stroke was in the context of insulin-dependent diabetes with neurovascular complications. Glucoses at the SNF have risen lately with all over 200.  The range has been 246 up to 513.  Epic states she is on Levemir 15 units twice daily. The most recent A1c was 09/15/2020 with a value of 7.5%.  Renal function is excellent with a creatinine of 0.4 and GFR of 90.  Review of systems: Staff reports that she has been nonverbal for several months and indeed that was the situation today.  There was no evidence of voluntary muscular activity either.  Physical exam:  Pertinent or positive findings: Facies are blank; she has a wide-eyed stare.  There is no pupillary response to bright light or physical response to noxious stimulus.  Mouth is agape and teeth are coated.  First heart sound is slightly accentuated.  Breath sounds are decreased.  PEG tube is present.  Pedal pulses are decreased with subtle asymmetry from dorsalis to posterior tibial pulses on palpation.  She exhibits spasticity to resistance when I attempted to lift the  extremities.  This was more notable in the left upper extremity.  Fine tremor was noted on the left.  Left foot is in a bootie. Toenails are elongated and thickened.  There is dressing over the left medial ankle and the right lateral foot.  She has tattoos over the lateral inferior calves bilaterally.  General appearance: no acute distress, increased work of breathing is present.   Lymphatic: No lymphadenopathy about the head, neck, axilla. Eyes: No conjunctival inflammation or lid edema is present. There is no scleral icterus. Ears:  External ear exam shows no significant lesions or deformities.   Nose:  External nasal examination shows no deformity or inflammation. Nasal mucosa are pink and moist without lesions, exudates Neck:  No thyromegaly, masses, tenderness noted.    Heart:  Normal rate and regular rhythm.  S2 normal without gallop, murmur, click, rub .  Lungs:  without wheezes, rhonchi, rales, rubs. Abdomen: Bowel sounds are normal. Abdomen is soft and nontender with no organomegaly, hernias, masses. GU: Deferred  Extremities:  No cyanosis, clubbing, edema  Neurologic exam : Balance, Rhomberg, finger to nose testing could not be completed due to clinical state Skin: Warm & dry w/o tenting. No significant rash.  See summary under each active problem in the Problem List with associated updated therapeutic plan

## 2020-12-28 NOTE — Assessment & Plan Note (Signed)
BP controlled; no change in antihypertensive medications  

## 2020-12-28 NOTE — Assessment & Plan Note (Signed)
Patient is completely nonverbal and does not respond to strong light or noxious stimuli.  CODE STATUS should be reviewed at the next care plan meeting.

## 2020-12-28 NOTE — Assessment & Plan Note (Addendum)
09/15/2020 A1c was 7.5%.  Recently glucoses have been rising with all over 200 and some even up to 500 or greater.  Levemir will be increased to 17 units twice daily and metformin 500 mg twice daily added if it can be given through the PEG tube. Glucoses monitor will continue and the A1c will be updated in several weeks.

## 2020-12-28 NOTE — Assessment & Plan Note (Signed)
Most recent creatinine was 0.4 and GFR 90 indicating normal function.  Because of this metformin will be added if it can be given through the PEG.

## 2020-12-28 NOTE — Progress Notes (Signed)
Therapist, nutritional Palliative Care Consult Note Telephone: [phone]  Fax: [phone]  PATIENT NAME: Tasha [name] DOB: 1945/10/14 MRN: [id number]  PRIMARY CARE PROVIDER:   Pecola Lawless, MD Pecola Lawless, MD [address] [place],  [place] [address]  REFERRING PROVIDER: Pecola Lawless, MD Pecola Lawless, MD [address] [place],  [place] [address]  RESPONSIBLE PARTY:Extended Emergency Contact Information Primary Emergency Contact: reshunda, strider Mobile Phone: [phone] Relation: Son Secondary Emergency Contact: Zona, Pedro Mobile Phone: [phone] Relation: Daughter Contact Information    Name Relation Home Work [place], [name] Son   [phone]   Emmelyn, Schmale Daughter   [phone]      Visit is to build trust and highlight Palliative Medicine as specialized medical care for people living with serious illness, aimed at facilitating better quality of life through symptoms relief, assisting with advance care planning and complex medical decision making.   RECOMMENDATIONS/PLAN:   Advance Care Planning/Code Status: Patient is a full code  Goals of Care: Goals of care include to maximize quality of life and symptom management.  Visit consisted of counseling and education dealing with the complex and emotionally intense issues of symptom management and palliative care in the setting of serious and potentially life-threatening illness. Palliative care team will continue to support patient, patient's family, and medical team.  Symptom management/Plan:  Essential hypertension: Stable, metoprolol titrate, amlodipine, hydralazine.  Dysphagia: PEG tube feeding Glucerna 1.2 at 60 mils per hour.  Aspiration precautions in place  Dementia: Fast 7C.  Continue ongoing supportive care.  CHIEF COMPLAINT: Palliative follow up visit/essential hypertension  HISTORY OF PRESENT ILLNESS:  Tasha [name] a 75 y.o. female  with multiple medical problems including essential hypertension which is currently well controlled with medications.  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  History obtained from review of EMR, discussion with primary team, family, caregiver  and/or patient. Records reviewed and summarized above.  Review of system from patient, EMR and discussion with nursing.  All 10 point systems reviewed and are negative except as documented in history of present illness above  Review and summarization of Epic records shows history from other than patient.   Palliative Care was asked to follow this patient by consultation request of Pecola Lawless, MD to help address complex decision making in the context of advance care planning and goals of care clarification.   PPS: 20%  PHYSICAL EXAM BP: 116/68 mmHg Temp: 97.6 degF Pulse: 56 /min Resp: 18 /min Pain: 0 Weight: 129.2 lbs General: In no acute distress, appropriately dressed Cardiovascular: regular rate and rhythm; no edema in BLE Pulmonary: no cough, no increased work of breathing, normal respiratory effort Abdomen: soft, non tender, no guarding, positive bowel sounds in all quadrants, PEG tube present GU:  no suprapubic tenderness, Foley cath with clear yellowish urine Eyes: Normal lids, no discharge, sclera anicteric ENMT: Moist mucous membranes Musculoskeletal:  weakness, sarcopenia Skin: no rash to visible skin, warm without cyanosis,  Psych: non-anxious affect Neurological: Weakness but otherwise non focal Heme/lymph/immuno: no bruises, no bleeding  PERTINENT MEDICATIONS:  Outpatient Encounter Medications as of 12/28/2020  Medication Sig  . amLODipine (NORVASC) 10 MG tablet Place 10 mg into feeding tube daily.  . barrier cream (NON-SPECIFIED) CREA Apply 1 application topically 2 (two) times daily as needed.  . bisacodyl (DULCOLAX) 10 MG suppository Place 10 mg rectally  as needed for moderate constipation.  If MOM doesn't work  . clopidogrel (PLAVIX) 75 MG tablet Place 1 tablet (75 mg total) into feeding tube daily.  . hydrALAZINE (APRESOLINE) 25 MG tablet Place 25 mg into feeding tube every 8 (eight) hours.  . insulin detemir (LEVEMIR) 100 UNIT/ML injection Inject 15 Units into the skin 2 (two) times daily.  . magnesium hydroxide (MILK OF MAGNESIA) 400 MG/5ML suspension Take 30 mLs by mouth daily as needed for mild constipation.  . metoprolol tartrate (LOPRESSOR) 25 MG tablet Place 25 mg into feeding tube 2 (two) times daily.  . Nutritional Supplements (FEEDING SUPPLEMENT, GLUCERNA 1.2 CAL,) LIQD Place into feeding tube every 6 (six) hours. Place at 60 ml/hr via PEG. Water flush to 125 ml Q6h.  . pantoprazole sodium (PROTONIX) 40 mg/20 mL PACK Place 20 mLs (40 mg total) into feeding tube daily.  . polyethylene glycol (MIRALAX / GLYCOLAX) 17 g packet Place 17 g into feeding tube daily as needed for mild constipation.  . rosuvastatin (CRESTOR) 5 MG tablet Place 1 tablet (5 mg total) into feeding tube daily.   No facility-administered encounter medications on file as of 12/28/2020.    HOSPICE ELIGIBILITY/DIAGNOSIS: TBD  PAST MEDICAL HISTORY:  Past Medical History:  Diagnosis Date  . 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 Essential (primary) hypertension   . History of falling   . MI (myocardial infarction) (HCC)   . Stroke [facility])      SOCIAL HX: @SOCX  Patient lives at [facility] for ongoing care  FAMILY HX:  Family History  Adopted: Yes  Family history unknown: Yes    ALLERGIES:  Allergies  Allergen Reactions  . Allopurinol Shortness Of Breath  . Tetracycline Anaphylaxis    She was placed on Doxycycline for truncal cellulitis in January 2022 w/o ADE & with improvement in the cellulitis..  . Amoxicillin-Pot Clavulanate Diarrhea  . Atorvastatin Other (See Comments)    Unknown reaction - reported by [facility], [facility] clinic  05/10/2012  . Azithromycin Other (See Comments)    Caused blisters inside and out  . Codeine Other (See Comments)    Altered mental status  . Lisinopril Other (See Comments)    Unknown reaction - reported by [facility], [facility] clinic 12/23/2008 02/20/2020 she believes "reaction" was NP cough. No PMH of angioedema  . Sulfa Antibiotics Diarrhea      I spent 55 minutes providing this consultation; this includes time spent with patient/family, chart review and documentation. More than 50% of the time in this consultation was spent on counseling and coordinating communication   Thank you for the opportunity to participate in the care of Tasha [name] Please call our office at [phone] if we can be of additional assistance.  Note: Portions of this note were generated with [phone]. Dictation errors may occur despite best attempts at proofreading.  Scientist, clinical (histocompatibility and immunogenetics), NP

## 2020-12-29 ENCOUNTER — Encounter: Payer: Self-pay | Admitting: Internal Medicine

## 2021-01-03 IMAGING — CR DG ABD PORTABLE 1V
1 series · 1 of 1 positions shown · non-contrast
Comparison: [DATE]

CLINICAL DATA: Feeding tube placement

EXAM:
PORTABLE ABDOMEN - 1 VIEW

[AP]
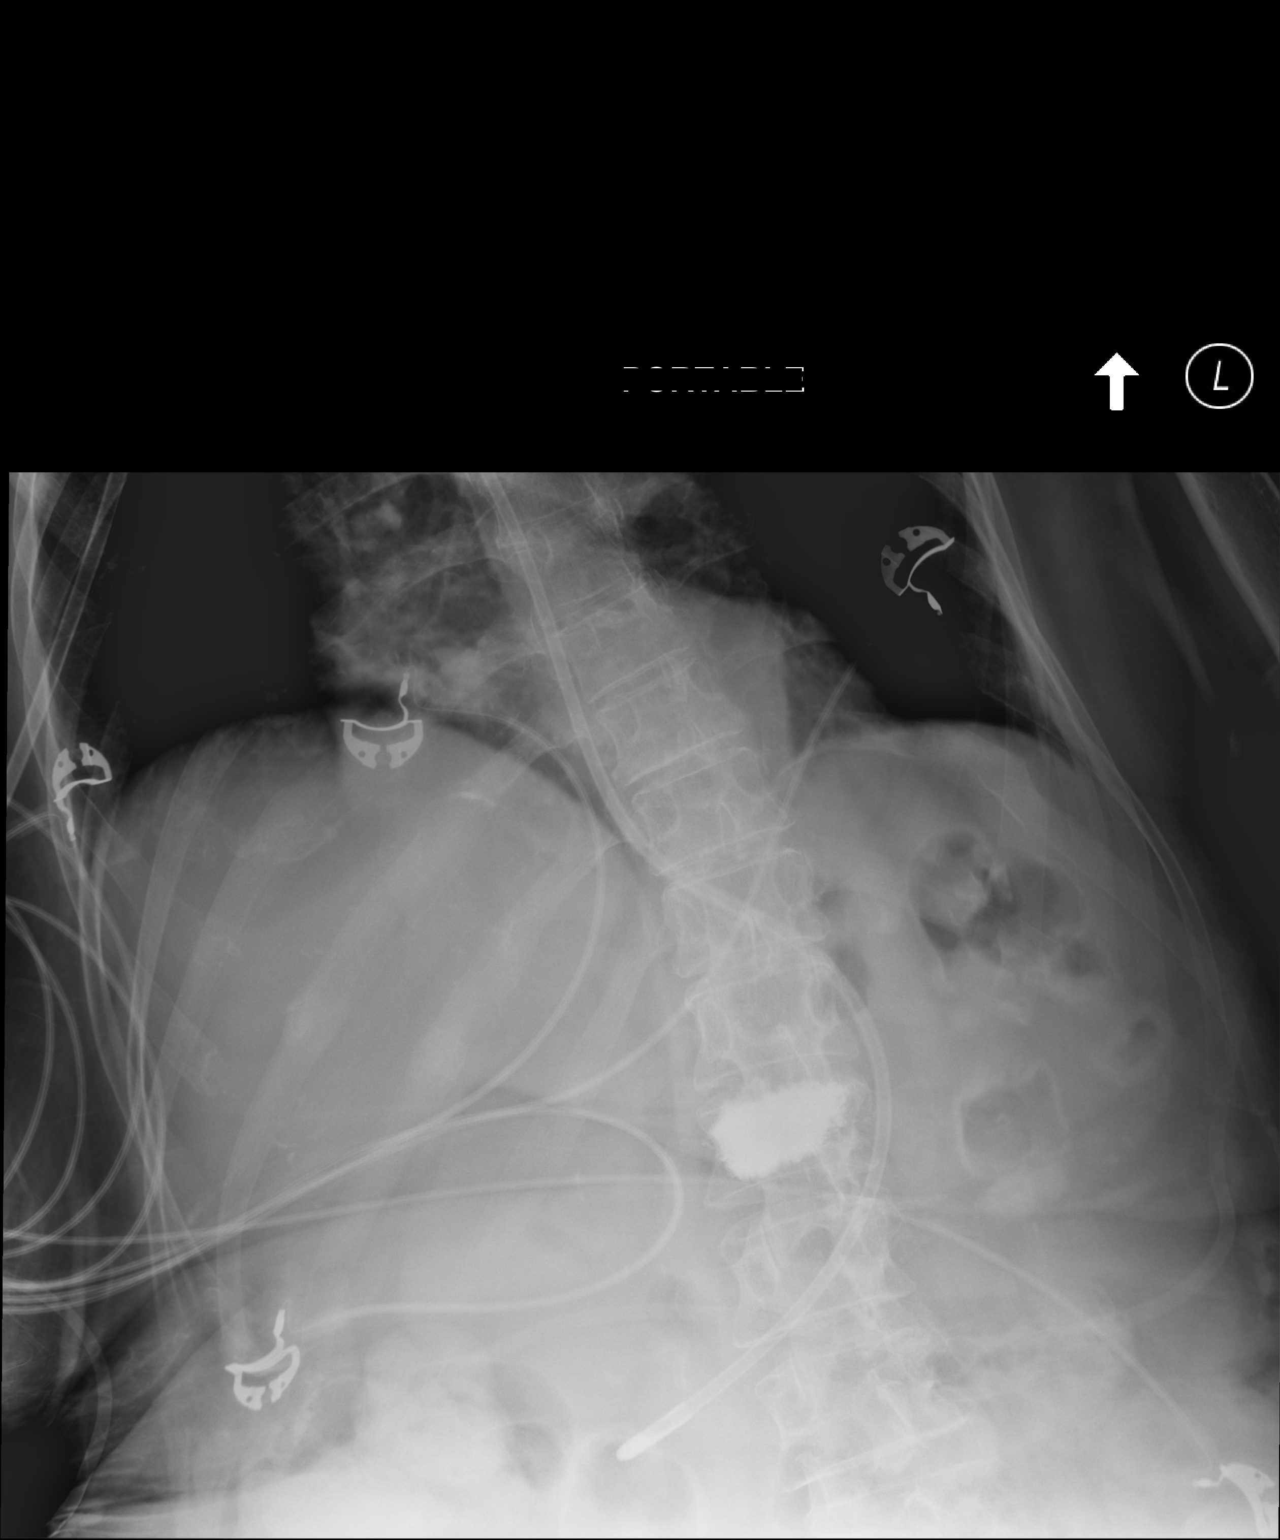

[1 of 1 positions shown; findings below may reference images not displayed]

FINDINGS: Weighted enteric feeding tube tip terminates within the expected
location of the distal stomach. Visualized bowel gas pattern is
nonspecific. No gross free intraperitoneal air. Cement augmentation
of L2. Healed posterior rib fractures involving the right tenth and
eleventh ribs.
IMPRESSION: Weighted enteric feeding tube tip terminates within the expected
location of the distal stomach.

## 2021-02-16 ENCOUNTER — Non-Acute Institutional Stay (SKILLED_NURSING_FACILITY): Payer: Self-pay | Admitting: Adult Health

## 2021-02-16 DIAGNOSIS — Z931 Gastrostomy status: Secondary | ICD-10-CM

## 2021-02-16 DIAGNOSIS — Z8673 Personal history of transient ischemic attack (TIA), and cerebral infarction without residual deficits: Secondary | ICD-10-CM

## 2021-02-16 DIAGNOSIS — I10 Essential (primary) hypertension: Secondary | ICD-10-CM

## 2021-02-16 DIAGNOSIS — E1151 Type 2 diabetes mellitus with diabetic peripheral angiopathy without gangrene: Secondary | ICD-10-CM

## 2021-02-16 NOTE — Progress Notes (Signed)
Location:  [facility] Room Number: 221 B Place of Service:  SNF (31) Provider:  Kenard Gower, DNP, FNP-BC  Patient Care Team: Pecola Lawless, MD as PCP - General (Internal Medicine) Medina-Vargas, Margit Banda, NP as Nurse Practitioner (Internal Medicine) Rehab, [facility] (Sk[facility])  Extended Emergency Contact Information Primary Emergency Contact: demani, weyrauch Mobile Phone: [phone] Relation: Son Secondary Emergency Contact: Nycole, Kawahara Mobile Phone: [phone] Relation: Daughter  Code Status:  FULL  Goals of care: Advanced Directive information Advanced Directives 02/16/2021  Does Patient Have a Medical Advance Directive? No  Would patient like information on creating a medical advance directive? No - Patient declined     Chief Complaint  Patient presents with   Medical Management of Chronic Issues    Routine follow up visit.    HPI:  Pt is a 75 y.o. female seen today for medical management of chronic diseases. She is a long-term care resident of [facility]. She has a PMH of dyslipidemia, MI and essential hypertension. She participates in restorative programs such as 1) splint/brace care to bilateral hands, and 2) PROM to bilateral hands. SBPs ranging from 93 to 142. She takes Amlodipine 10 mg 1 tab daily, Hydralazine 25 mg every 8 hours and Metoprolol tartrate 25 mg BID for hypertension. She needs total care with her ADLs. She takes  Plavix 75 mg daily and Rosuvastatin 5 mg daily for history of CVA.   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   Amoxicillin-Pot Clavulanate Diarrhea   Atorvastatin Other (See Comments)    Unknown reaction - reported by [facility], [place] clinic 05/10/2012   Azithromycin Other (See Comments)    Caused blisters inside and out   Codeine Other (See Comments)    Altered mental status   Lisinopril Other (See Comments)    Unknown reaction - reported by [facility], [place] clinic 12/23/2008 02/20/2020 she believes "reaction" was NP cough. No PMH of angioedema   Sulfa Antibiotics Diarrhea    Outpatient Encounter Medications as of 02/16/2021  Medication Sig   amLODipine (NORVASC) 10 MG tablet Place 10 mg into feeding tube daily.   barrier cream (NON-SPECIFIED) CREA Apply 1 application topically 2 (two) times daily as needed.   clopidogrel (PLAVIX) 75 MG tablet Place 1 tablet (75 mg total) into feeding tube daily.   hydrALAZINE (APRESOLINE) 25 MG tablet Place 25 mg into feeding tube every 8 (eight) hours.   insulin detemir (LEVEMIR) 100 UNIT/ML injection Inject 18 Units into the skin 2 (two) times daily.   metformin (FORTAMET) 500 MG (OSM) 24 hr tablet Take 500 mg by mouth. TAKE 1 TABLET VIA G-TUBE TWICE A DAY FOR DM   metoprolol tartrate (LOPRESSOR) 25 MG tablet Place 25 mg into feeding tube 2 (two) times daily.  Nutritional Supplements (FEEDING SUPPLEMENT, GLUCERNA 1.2 CAL,) LIQD Place into feeding tube every 6 (six) hours. Place at 60 ml/hr via PEG. Water flush to 125 ml Q6h.   pantoprazole sodium (PROTONIX) 40 mg/20 mL PACK Place 20 mLs (40 mg total) into feeding tube daily.   polyethylene glycol (MIRALAX / GLYCOLAX) 17 g packet Place 17 g into feeding tube daily as needed for mild constipation.   rosuvastatin (CRESTOR) 5 MG tablet Place 1 tablet (5 mg total) into  feeding tube daily.   [DISCONTINUED] bisacodyl (DULCOLAX) 10 MG suppository Place 10 mg rectally as needed for moderate constipation. If MOM doesn't work   [DISCONTINUED] magnesium hydroxide (MILK OF MAGNESIA) 400 MG/5ML suspension Take 30 mLs by mouth daily as needed for mild constipation.   No facility-administered encounter medications on file as of 02/16/2021.    Review of Systems  Unable to obtain due to nonverbal.    Immunization History  Administered Date(s) Administered   Influenza, High Dose Seasonal PF 05/04/2016   Influenza-Unspecified 06/17/2020   PFIZER(Purple Top)SARS-COV-2 Vaccination 10/03/2019, 10/24/2019   Pneumococcal Conjugate-13 09/01/2020   Pneumococcal Polysaccharide-23 08/29/2003, 06/30/2009   Unspecified SARS-COV-2 Vaccination 08/31/2020   Zoster, Live 01/29/2006   Pertinent  Health Maintenance Due  Topic Date Due   FOOT EXAM  Never done   OPHTHALMOLOGY EXAM  Never done   COLONOSCOPY (Pts 45-91yrs Insurance coverage will need to be confirmed)  Never done   MAMMOGRAM  Never done   DEXA SCAN  Never done   HEMOGLOBIN A1C  03/15/2021   INFLUENZA VACCINE  03/28/2021   PNA vac Low Risk Adult (2 of 2 - PPSV23) 09/01/2021   No flowsheet data found.   Vitals:   02/16/21 1435  BP: (!) 139/95  Pulse: 84  Resp: 15  Temp: (!) 97.5 F (36.4 C)  Weight: 131 lb (59.4 kg)  Height: 5\' 1"  (1.549 m)   Body mass index is 24.75 kg/m.  Physical Exam  GENERAL APPEARANCE: Well nourished. In no acute distress. Normal body habitus SKIN:  Skin is warm and dry.  MOUTH and THROAT: Lips are without lesions. Oral mucosa is moist and without lesions.  RESPIRATORY: Breathing is even & unlabored, BS CTAB CARDIAC: RRR, no murmur,no extra heart sounds, no edema GI: +peg tube NEUROLOGICAL: There is no tremor. Nonverbal. Unable to move any extremities. PSYCHIATRIC:  Affect and behavior are appropriate  Labs reviewed: Recent Labs    04/04/20 0315 04/05/20 0352  04/05/20 06/05/20 04/06/20 0408 04/07/20 0555 04/09/20 0819 04/10/20 0251 04/22/20 0907 04/23/20 0314 04/26/20 0101 09/15/20 0000  NA 128* 128*  --  137   < > 146*   < > 142 141 138 138  K 4.7 4.2  --  3.6   < > 3.7   < > 4.3 3.9 3.8 3.8  CL 93* 94*  --  102   < > 111   < > 107 107 107 102  CO2 20* 19*  --  21*   < > 24   < > 27 25 21* 24*  GLUCOSE 219* 137*  --  115*   < > 178*   < > 176* 134* 181*  --   BUN 63* 77*  --  65*   < > 39*   < > 44* 36* 22 26*  CREATININE 1.42* 1.87*  --  1.57*   < > 0.99   < > 0.64 0.59 0.56 0.4*  CALCIUM 9.7 9.2  --  9.1   < >  9.2   < > 8.9 8.8* 8.7* 9.6  MG 2.0 2.1  --  2.1  --   --   --   --   --   --   --   PHOS 3.7  --  4.5 4.1  --  3.5  --   --   --   --   --    < > = values in this interval not displayed.   Recent Labs    03/20/20 0216 03/21/20 0415 03/22/20 0306 04/04/20 0315 09/15/20 0000  AST 48* 29 60*  --  10*  ALT 95* 91* 121*  --  13  ALKPHOS 57 56 54  --  82  BILITOT 0.6 0.5 0.6  --   --   PROT 6.8 6.8 6.8  --   --   ALBUMIN 2.6* 2.6* 2.6* 2.5* 3.3*   Recent Labs    04/20/20 0256 04/23/20 0314 04/26/20 0101 09/15/20 0000  WBC 12.1* 9.1 9.6 11.8  NEUTROABS 8.5* 4.9  --  8.70  HGB 8.6* 7.4* 8.3* 9.9*  HCT 28.9* 24.7* 26.1* 30*  MCV 98.0 98.4 97.0  --   PLT 385 312 325 305   Lab Results  Component Value Date   TSH 2.232 03/08/2020   Lab Results  Component Value Date   HGBA1C 7.5 09/15/2020   Lab Results  Component Value Date   CHOL 118 09/15/2020   HDL 36 09/15/2020   LDLCALC 29 09/15/2020   TRIG 265 (A) 09/15/2020   CHOLHDL 4.6 02/16/2020    Significant Diagnostic Results in last 30 days:  No results found.  Assessment/Plan  1. Essential (primary) hypertension -   BPs stable, continue Amlodipine, Hydralazine and Metoprolol succinate  2. History of CVA (cerebrovascular accident) -   stable, continue Plavix and Rosuvastatin  3. DM (diabetes mellitus), type 2 with peripheral vascular complications  [facility]) Lab Results  Component Value Date   HGBA1C 7.5 09/15/2020   -  continue Levemir and Metformin  4. PEG (percutaneous endoscopic gastrostomy) status (HCC) -  due to dysphagia -  peg-tube care daily     Family/ staff Communication: Discussed plan of care with charge nurse.  Labs/tests ordered: None  Goals of care:   Long-term care   Kenard Gower, DNP, MSN, FNP-BC [facility] [phone] (Monday-Friday 8:00 a.m. - 5:00 p.m.) [phone] (after hours)

## 2021-02-21 LAB — HEMOGLOBIN A1C: Hemoglobin A1C: 6.7

## 2021-03-08 ENCOUNTER — Encounter: Payer: Self-pay | Admitting: Adult Health

## 2021-03-08 ENCOUNTER — Non-Acute Institutional Stay (SKILLED_NURSING_FACILITY): Payer: Self-pay | Admitting: Adult Health

## 2021-03-08 DIAGNOSIS — Z8673 Personal history of transient ischemic attack (TIA), and cerebral infarction without residual deficits: Secondary | ICD-10-CM

## 2021-03-08 DIAGNOSIS — R1310 Dysphagia, unspecified: Secondary | ICD-10-CM

## 2021-03-08 DIAGNOSIS — I10 Essential (primary) hypertension: Secondary | ICD-10-CM

## 2021-03-08 DIAGNOSIS — E1151 Type 2 diabetes mellitus with diabetic peripheral angiopathy without gangrene: Secondary | ICD-10-CM

## 2021-03-08 NOTE — Progress Notes (Signed)
Location:  [facility] Room Number: 221 B Place of Service:  SNF (31) Provider:  Kenard Gower, DNP, FNP-BC  Patient Care Team: Pecola Lawless, MD as PCP - General (Internal Medicine) Medina-Vargas, Margit Banda, NP as Nurse Practitioner (Internal Medicine) Rehab, [facility] ([facility])  Extended Emergency Contact Information Primary Emergency Contact: gaynelle, pastrana Mobile Phone: [phone] Relation: Son Secondary Emergency Contact: Isabeau, Mccalla Mobile Phone: [phone] Relation: Daughter  Code Status:    Goals of care: Advanced Directive information Advanced Directives 02/16/2021  Does Patient Have a Medical Advance Directive? No  Would patient like information on creating a medical advance directive? No - Patient declined     Chief Complaint  Patient presents with   Medical Management of Chronic Issues    Routine Visit    HPI:  Pt is a 75 y.o. female seen today for medical management of chronic diseases. She is a long-term care resident of [facility]. She has a PMH of dyslipidemia, MI and essential hypertension. She participates in restorative programs such as 1) PROM bilateral hands and fingers, and 2) splint/brace care to bilateral hands. She takes Clopidogrel 75 mg 1 tab daily and Rosuvastatin 5 mg daily for history of CVA. She is quadriplegic needing a total care. CBGs ranging from 74 to 210. She takes Levemir 18 units SQ BID and Metformin 500 mg twice a day for diabetes mellitus. BPs 117/63 and 114/67. 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   Amoxicillin-Pot Clavulanate Diarrhea   Atorvastatin Other (See Comments)    Unknown reaction - reported by [facility], [place] clinic 05/10/2012   Azithromycin Other (See Comments)    Caused blisters inside and out   Codeine Other (See Comments)    Altered mental status   Lisinopril Other (See Comments)    Unknown reaction - reported by [facility], [place] clinic 12/23/2008 02/20/2020 she believes "reaction" was NP cough. No PMH of angioedema   Sulfa Antibiotics Diarrhea    Outpatient Encounter Medications as of 03/08/2021  Medication Sig   amLODipine (NORVASC) 10 MG tablet Place 10 mg into feeding tube daily.   barrier cream (NON-SPECIFIED) CREA Apply 1 application topically 2 (two) times daily as needed.   clopidogrel (PLAVIX) 75 MG tablet Place 1 tablet (75 mg total) into feeding tube daily.   hydrALAZINE (APRESOLINE) 25 MG tablet Place 25 mg into feeding tube every 8 (eight) hours.   insulin detemir (LEVEMIR) 100 UNIT/ML injection Inject 18 Units into the skin 2 (two) times daily.   metformin (FORTAMET) 500 MG (OSM) 24 hr tablet Take 500 mg by mouth. TAKE 1 TABLET VIA G-TUBE TWICE A DAY FOR DM  metoprolol tartrate (LOPRESSOR) 25 MG tablet Place 25 mg into feeding tube 2 (two) times daily.   Nutritional Supplements (FEEDING SUPPLEMENT, GLUCERNA 1.2 CAL,) LIQD Place into feeding tube every 6 (six) hours. Place at 60 ml/hr via PEG. Water flush to 125 ml Q6h.   pantoprazole sodium (PROTONIX) 40 mg/20 mL PACK Place 20 mLs (40 mg total) into feeding tube daily.   polyethylene glycol (MIRALAX / GLYCOLAX) 17 g packet Place 17 g into feeding  tube daily as needed for mild constipation.   rosuvastatin (CRESTOR) 5 MG tablet Place 1 tablet (5 mg total) into feeding tube daily.   No facility-administered encounter medications on file as of 03/08/2021.    Review of Systems  Unable to obtain due to nonverbal.   Immunization History  Administered Date(s) Administered   Influenza, High Dose Seasonal PF 05/04/2016   Influenza-Unspecified 06/17/2020   PFIZER(Purple Top)SARS-COV-2 Vaccination 10/03/2019, 10/24/2019   Pneumococcal Conjugate-13 09/01/2020   Pneumococcal Polysaccharide-23 08/29/2003, 06/30/2009   Unspecified SARS-COV-2 Vaccination 08/31/2020   Zoster, Live 01/29/2006   Pertinent  Health Maintenance Due  Topic Date Due   FOOT EXAM  Never done   OPHTHALMOLOGY EXAM  Never done   COLONOSCOPY (Pts 45-55yrs Insurance coverage will need to be confirmed)  Never done   MAMMOGRAM  Never done   DEXA SCAN  Never done   HEMOGLOBIN A1C  03/15/2021   INFLUENZA VACCINE  03/28/2021   PNA vac Low Risk Adult (2 of 2 - PPSV23) 09/01/2021   No flowsheet data found.   Vitals:   03/08/21 1000  BP: 117/63  Pulse: 74  Resp: 16  Temp: 97.9 F (36.6 C)  Weight: 130 lb 11.2 oz (59.3 kg)  Height: 5\' 1"  (1.549 m)   Body mass index is 24.7 kg/m.  Physical Exam  GENERAL APPEARANCE: Well nourished. In no acute distress.  SKIN:  Skin is warm and dry.  MOUTH and THROAT: Lips are without lesions. Oral mucosa is moist and without lesions.  RESPIRATORY: Breathing is even & unlabored, BS CTAB CARDIAC: RRR, no murmur,no extra heart sounds, no edema GI: +peg tube NEUROLOGICAL: There is no tremor. Nonverbal. PSYCHIATRIC:  Affect and behavior are appropriate  Labs reviewed: Recent Labs    04/04/20 0315 04/05/20 0352 04/05/20 06/05/20 04/06/20 0408 04/07/20 0555 04/09/20 0819 04/10/20 0251 04/22/20 0907 04/23/20 0314 04/26/20 0101 09/15/20 0000  NA 128* 128*  --  137   < > 146*   < > 142 141 138 138  K 4.7 4.2  --  3.6   < > 3.7    < > 4.3 3.9 3.8 3.8  CL 93* 94*  --  102   < > 111   < > 107 107 107 102  CO2 20* 19*  --  21*   < > 24   < > 27 25 21* 24*  GLUCOSE 219* 137*  --  115*   < > 178*   < > 176* 134* 181*  --   BUN 63* 77*  --  65*   < > 39*   < > 44* 36* 22 26*  CREATININE 1.42* 1.87*  --  1.57*   < > 0.99   < > 0.64 0.59 0.56 0.4*  CALCIUM 9.7 9.2  --  9.1   < > 9.2   < > 8.9 8.8* 8.7* 9.6  MG 2.0 2.1  --  2.1  --   --   --   --   --   --   --  PHOS 3.7  --  4.5 4.1  --  3.5  --   --   --   --   --    < > = values in this interval not displayed.   Recent Labs    03/20/20 0216 03/21/20 0415 03/22/20 0306 04/04/20 0315 09/15/20 0000  AST 48* 29 60*  --  10*  ALT 95* 91* 121*  --  13  ALKPHOS 57 56 54  --  82  BILITOT 0.6 0.5 0.6  --   --   PROT 6.8 6.8 6.8  --   --   ALBUMIN 2.6* 2.6* 2.6* 2.5* 3.3*   Recent Labs    04/20/20 0256 04/23/20 0314 04/26/20 0101 09/15/20 0000  WBC 12.1* 9.1 9.6 11.8  NEUTROABS 8.5* 4.9  --  8.70  HGB 8.6* 7.4* 8.3* 9.9*  HCT 28.9* 24.7* 26.1* 30*  MCV 98.0 98.4 97.0  --   PLT 385 312 325 305   Lab Results  Component Value Date   TSH 2.232 03/08/2020   Lab Results  Component Value Date   HGBA1C 7.5 09/15/2020   Lab Results  Component Value Date   CHOL 118 09/15/2020   HDL 36 09/15/2020   LDLCALC 29 09/15/2020   TRIG 265 (A) 09/15/2020   CHOLHDL 4.6 02/16/2020    Significant Diagnostic Results in last 30 days:  No results found.  Assessment/Plan  1. History of CVA (cerebrovascular accident) -  stable, continue Clopidogrel and Rosuvastatin  2. DM (diabetes mellitus), type 2 with peripheral vascular complications (HCC) Lab Results  Component Value Date   HGBA1C 7.5 09/15/2020   -  CBGs stable, continue Levemir and Metformin -  continue CBG checks  3. Essential (primary) hypertension -  BPs stable, continue Amlodipine, Hydralazine and Metoprolol tartrate  4. Dysphagia, unspecified type -   Continue Glucerna 1.5 cal at 60 ml/hour -    aspiration precautions    Family/ staff Communication:  Discussed plan of care with charge nurse.  Labs/tests ordered:  None  Goals of care:   Long-term care   Kenard Gower, DNP, MSN, FNP-BC [facility] and Adult Medicine [phone] (Monday-Friday 8:00 a.m. - 5:00 p.m.) [phone] (after hours)

## 2021-03-14 ENCOUNTER — Encounter: Payer: Self-pay | Admitting: Adult Health

## 2021-03-14 ENCOUNTER — Non-Acute Institutional Stay (SKILLED_NURSING_FACILITY): Payer: Self-pay | Admitting: Adult Health

## 2021-03-14 DIAGNOSIS — E1151 Type 2 diabetes mellitus with diabetic peripheral angiopathy without gangrene: Secondary | ICD-10-CM

## 2021-03-14 DIAGNOSIS — I10 Essential (primary) hypertension: Secondary | ICD-10-CM

## 2021-03-14 DIAGNOSIS — Z8673 Personal history of transient ischemic attack (TIA), and cerebral infarction without residual deficits: Secondary | ICD-10-CM

## 2021-03-14 DIAGNOSIS — R918 Other nonspecific abnormal finding of lung field: Secondary | ICD-10-CM

## 2021-03-14 NOTE — Progress Notes (Signed)
Location:  [facility] Room Number: 221 B Place of Service:  SNF (31) Provider:  Kenard Gower, DNP, FNP-BC  Patient Care Team: Pecola Lawless, MD as PCP - General (Internal Medicine) Medina-Vargas, Margit Banda, NP as Nurse Practitioner (Internal Medicine) Rehab, [facility] (Skilled Nursing Facility)  Extended Emergency Contact Information Primary Emergency Contact: cleola, perryman Mobile Phone: [phone] Relation: Son Secondary Emergency Contact: Fawne, Hughley Mobile Phone: [phone] Relation: Daughter  Code Status:  FULL CODE  Goals of care: Advanced Directive information Advanced Directives 03/14/2021  Does Patient Have a Medical Advance Directive? No  Would patient like information on creating a medical advance directive? No - Patient declined     Chief Complaint  Patient presents with   Acute Visit    Abnormal chest xray    HPI:  Pt is a 75 y.o. female seen today for abnormal chest x-ray. She was reported to be coughing with copious amount of secretions. Chest x-ray showed slight right middle lobe infiltrate. Low grade fever reported, T 99.1. She has history of CVA with residual quadriplegia. She has dysphagia and fed via peg tube with Glucerna 1.2 cal at 60 ml/hour. She takes Plavix 75 mg daily and Rosuvastatin 5 mg daily for history of CVA. She is mostly nonverbal. CBGs ranging from 107 to 213, with outlier 74, 78 and 351. She takes metformin 500 mg twice a day and Levemir 18 units SQ BID for diabetes mellitus.   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   Amoxicillin-Pot Clavulanate Diarrhea   Atorvastatin Other (See Comments)    Unknown reaction - reported by [facility] [place] clinic 05/10/2012   Azithromycin Other (See Comments)    Caused blisters inside and out   Codeine Other (See Comments)    Altered mental status   Lisinopril Other (See Comments)    Unknown reaction - reported by [facility], [place] clinic 12/23/2008 02/20/2020 she believes "reaction" was NP cough. No PMH of angioedema   Sulfa Antibiotics Diarrhea    Outpatient Encounter Medications as of 03/14/2021  Medication Sig   amLODipine (NORVASC) 10 MG tablet Place 10 mg into feeding tube daily.   barrier cream (NON-SPECIFIED) CREA Apply 1 application topically 2 (two) times daily as needed.   clopidogrel (PLAVIX) 75 MG tablet Place 1 tablet (75 mg total) into feeding tube daily.   hydrALAZINE (APRESOLINE) 25 MG tablet Place 25 mg into feeding tube every 8 (eight) hours.   insulin detemir (LEVEMIR) 100 UNIT/ML injection Inject 18 Units into the skin 2 (two) times daily.   levofloxacin (LEVAQUIN) 750 MG tablet Take 750 mg by mouth daily. 1 Tablet via G-tube Once daily for 5 days   metformin (FORTAMET) 500 MG (OSM) 24 hr tablet Take 500 mg by mouth. TAKE 1 TABLET VIA G-TUBE TWICE A  DAY FOR DM   metoprolol tartrate (LOPRESSOR) 25 MG tablet Place 25 mg into feeding tube 2 (two) times daily.   Nutritional Supplements (FEEDING SUPPLEMENT, GLUCERNA 1.2 CAL,) LIQD Place into feeding tube every 6 (six) hours. Place at 60 ml/hr via PEG. Water flush to 125 ml Q6h.   pantoprazole sodium (PROTONIX) 40 mg/20 mL PACK Place 20 mLs (40 mg total) into feeding tube daily.   polyethylene glycol (MIRALAX / GLYCOLAX) 17 g packet Place 17 g into feeding tube daily as needed for mild  constipation.   rosuvastatin (CRESTOR) 5 MG tablet Place 1 tablet (5 mg total) into feeding tube daily.   No facility-administered encounter medications on file as of 03/14/2021.    Review of Systems  Unable to obtain due to nonverbal   Immunization History  Administered Date(s) Administered   Influenza, High Dose Seasonal PF 05/04/2016   Influenza-Unspecified 06/17/2020   PFIZER(Purple Top)SARS-COV-2 Vaccination 10/03/2019, 10/24/2019   Pneumococcal Conjugate-13 09/01/2020   Pneumococcal Polysaccharide-23 08/29/2003, 06/30/2009   Unspecified SARS-COV-2 Vaccination 08/31/2020   Zoster, Live 01/29/2006   Pertinent  Health Maintenance Due  Topic Date Due   FOOT EXAM  Never done   OPHTHALMOLOGY EXAM  Never done   COLONOSCOPY (Pts 45-33yrs Insurance coverage will need to be confirmed)  Never done   MAMMOGRAM  Never done   DEXA SCAN  Never done   HEMOGLOBIN A1C  03/15/2021   INFLUENZA VACCINE  03/28/2021   PNA vac Low Risk Adult (2 of 2 - PPSV23) 09/01/2021   No flowsheet data found.   Vitals:   03/14/21 1201  BP: 138/74  Pulse: 81  Resp: 18  Temp: (!) 97.1 F (36.2 C)  Weight: 130 lb 11.2 oz (59.3 kg)  Height: 5\' 1"  (1.549 m)   Body mass index is 24.7 kg/m.  Physical Exam  GENERAL APPEARANCE: Well nourished. In no acute distress. Normal body habitus SKIN:  Skin is warm and dry.  MOUTH and THROAT: Lips are without lesions. Oral mucosa is moist and without lesions.  RESPIRATORY: Breathing is even & unlabored, +rales CARDIAC: RRR, no murmur,no extra heart sounds, no edema GI: +peg tube NEUROLOGICAL: Quadriplegic, nonverbal PSYCHIATRIC:  Affect is flat.  Labs reviewed: Recent Labs    04/04/20 0315 04/05/20 0352 04/05/20 06/05/20 04/06/20 0408 04/07/20 0555 04/09/20 0819 04/10/20 0251 04/22/20 0907 04/23/20 0314 04/26/20 0101 09/15/20 0000  NA 128* 128*  --  137   < > 146*   < > 142 141 138 138  K 4.7 4.2  --  3.6   < > 3.7   < > 4.3 3.9 3.8 3.8  CL 93* 94*   --  102   < > 111   < > 107 107 107 102  CO2 20* 19*  --  21*   < > 24   < > 27 25 21* 24*  GLUCOSE 219* 137*  --  115*   < > 178*   < > 176* 134* 181*  --   BUN 63* 77*  --  65*   < > 39*   < > 44* 36* 22 26*  CREATININE 1.42* 1.87*  --  1.57*   < > 0.99   < > 0.64 0.59 0.56 0.4*  CALCIUM 9.7 9.2  --  9.1   < > 9.2   < > 8.9 8.8* 8.7* 9.6  MG 2.0 2.1  --  2.1  --   --   --   --   --   --   --  PHOS 3.7  --  4.5 4.1  --  3.5  --   --   --   --   --    < > = values in this interval not displayed.   Recent Labs    03/20/20 0216 03/21/20 0415 03/22/20 0306 04/04/20 0315 09/15/20 0000  AST 48* 29 60*  --  10*  ALT 95* 91* 121*  --  13  ALKPHOS 57 56 54  --  82  BILITOT 0.6 0.5 0.6  --   --   PROT 6.8 6.8 6.8  --   --   ALBUMIN 2.6* 2.6* 2.6* 2.5* 3.3*   Recent Labs    04/20/20 0256 04/23/20 0314 04/26/20 0101 09/15/20 0000  WBC 12.1* 9.1 9.6 11.8  NEUTROABS 8.5* 4.9  --  8.70  HGB 8.6* 7.4* 8.3* 9.9*  HCT 28.9* 24.7* 26.1* 30*  MCV 98.0 98.4 97.0  --   PLT 385 312 325 305   Lab Results  Component Value Date   TSH 2.232 03/08/2020   Lab Results  Component Value Date   HGBA1C 7.5 09/15/2020   Lab Results  Component Value Date   CHOL 118 09/15/2020   HDL 36 09/15/2020   LDLCALC 29 09/15/2020   TRIG 265 (A) 09/15/2020   CHOLHDL 4.6 02/16/2020    Significant Diagnostic Results in last 30 days:  No results found.  Assessment/Plan  1. Infiltrate of middle lobe of right lung present on imaging study -  will continue Levaquin 750 mg daily x5 days  2. History of CVA (cerebrovascular accident) -Stable, continue Plavix and rosuvastatin  3. DM (diabetes mellitus), type 2 with peripheral vascular complications [facility]) Lab Results  Component Value Date   HGBA1C 7.5 09/15/2020   -   Continue metformin and Levemir  4. Essential (primary) hypertension -  BPs stable, continue amlodipine, hydralazine and metoprolol titrate    Family/ staff Communication: Discussed  plan of care with charge nurse.  Labs/tests ordered: None  Goals of care:   Long-term care   Kenard Gower, DNP, MSN, FNP-BC [facility] and Adult Medicine [phone] (Monday-Friday 8:00 a.m. - 5:00 p.m.) [phone] (after hours)

## 2021-03-15 LAB — BASIC METABOLIC PANEL
BUN: 31 — AB (ref 4–21)
CO2: 23 — AB (ref 13–22)
Chloride: 102 (ref 99–108)
Creatinine: 0.5 (ref <=1.1)
Glucose: 174
Potassium: 4.3 (ref 3.4–5.3)
Sodium: 139 (ref 137–147)

## 2021-03-15 LAB — CBC AND DIFFERENTIAL
HCT: 33 — AB (ref 36–46)
Hemoglobin: 11.5 — AB (ref 12.0–16.0)
Platelets: 289 (ref 150–399)
WBC: 9.1

## 2021-03-15 LAB — COMPREHENSIVE METABOLIC PANEL
Albumin: 3.6 (ref 3.5–5.0)
Calcium: 10.2 (ref 8.7–10.7)
GFR calc Af Amer: <90
GFR calc non Af Amer: >90
Globulin: 2.4

## 2021-03-15 LAB — CBC: RBC: 3.88 (ref 3.87–5.11)

## 2021-03-15 LAB — HEPATIC FUNCTION PANEL
ALT: 8 (ref 7–35)
AST: 10 — AB (ref 13–35)
Alkaline Phosphatase: 94 (ref 25–125)
Bilirubin, Total: 0.2

## 2021-03-15 LAB — LIPID PANEL
Cholesterol: 131 (ref 0–200)
HDL: 37 (ref 35–70)
LDL Cholesterol: 57
LDl/HDL Ratio: 3.6
Triglycerides: 186 — AB (ref 40–160)

## 2021-03-23 ENCOUNTER — Other Ambulatory Visit: Payer: Self-pay | Admitting: Adult Health

## 2021-03-23 MED ORDER — MOXIFLOXACIN HCL 400 MG PO TABS: 400.0000 mg | ORAL_TABLET | Freq: Every day | ORAL | 0 refills | Status: AC

## 2021-03-24 ENCOUNTER — Non-Acute Institutional Stay (SKILLED_NURSING_FACILITY): Payer: Self-pay | Admitting: Adult Health

## 2021-03-24 ENCOUNTER — Encounter: Payer: Self-pay | Admitting: Adult Health

## 2021-03-24 DIAGNOSIS — J690 Pneumonitis due to inhalation of food and vomit: Secondary | ICD-10-CM

## 2021-03-24 DIAGNOSIS — R1310 Dysphagia, unspecified: Secondary | ICD-10-CM

## 2021-03-24 DIAGNOSIS — Z8673 Personal history of transient ischemic attack (TIA), and cerebral infarction without residual deficits: Secondary | ICD-10-CM

## 2021-03-24 LAB — COMPREHENSIVE METABOLIC PANEL
Calcium: 10 (ref 8.7–10.7)
GFR calc Af Amer: >90
GFR calc non Af Amer: >90

## 2021-03-24 LAB — BASIC METABOLIC PANEL
BUN: 32 — AB (ref 4–21)
CO2: 24 — AB (ref 13–22)
Chloride: 102 (ref 99–108)
Creatinine: 0.5 (ref 0.5–1.1)
Glucose: 161
Potassium: 4.2 (ref 3.4–5.3)
Sodium: 143 (ref 137–147)

## 2021-03-24 LAB — CBC AND DIFFERENTIAL
HCT: 35 — AB (ref 36–46)
Hemoglobin: 11.4 — AB (ref 12.0–16.0)
Platelets: 325 (ref 150–399)
WBC: 15.3

## 2021-03-24 LAB — CBC: RBC: 4.02 (ref 3.87–5.11)

## 2021-03-24 NOTE — Progress Notes (Signed)
Location:  [facility] Room Number: 221-B Place of Service:  SNF (31) Provider:  Kenard Gower, DNP, FNP-BC  Patient Care Team: Pecola Lawless, MD as PCP - General (Internal Medicine) Medina-Vargas, Margit Banda, NP as Nurse Practitioner (Internal Medicine) Rehab, [facility] (Skilled Nursing Facility)  Extended Emergency Contact Information Primary Emergency Contact: kaylei, frink Mobile Phone: [phone] Relation: Son Secondary Emergency Contact: Jowana, Thumma Mobile Phone: [phone] Relation: Daughter  Code Status: Full code   Goals of care: Advanced Directive information Advanced Directives 03/24/2021  Does Patient Have a Medical Advance Directive? No  Would patient like information on creating a medical advance directive? No - Patient declined     Chief Complaint  Patient presents with   Acute Visit    Possible aspiration pneumonia.    HPI   Pt is a 75 y.o. Tasha [name] seen today for an acute visit. Resident was reported by charge nurse to have vomited moderate amount of tube feedings/Glucerna 1.2. Ches x-ray was done and showed bilateral pulmonary infiltrates. Labs showed wbc 15.3, hgb 11.4. No reported fever. She had CVA and unable to move all 4 extremities. She  has dysphagia and uses peg tube for nutrition. She is a long-term care resident of [facility].   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   Amoxicillin-Pot Clavulanate Diarrhea   Atorvastatin Other (See Comments)    Unknown reaction - reported by [facility], [place] clinic 05/10/2012   Azithromycin Other (See Comments)    Caused blisters inside and out   Codeine Other (See Comments)    Altered mental status   Lisinopril Other (See Comments)    Unknown reaction - reported by [facility], [place] clinic 12/23/2008 02/20/2020 she believes "reaction" was NP cough. No PMH of angioedema   Sulfa Antibiotics Diarrhea    Outpatient Encounter Medications as of 03/24/2021  Medication Sig   amLODipine (NORVASC) 10 MG tablet Place 10 mg into feeding tube daily.   barrier cream (NON-SPECIFIED) CREA Apply 1 application topically 2 (two) times daily as needed.   bisacodyl (DULCOLAX) 10 MG suppository If not relieved by MOM, give 10 mg Bisacodyl suppositiory rectally X 1 dose in 24 hours as needed (Do not use constipation standing orders for residents with renal failure/CFR less than 30. Contact MD for orders)   chlorhexidine (PERIDEX) 0.12 % solution Use as directed in the mouth or throat 2 (two) times daily.   clopidogrel (PLAVIX) 75 MG tablet Place 1 tablet (75 mg total) into feeding tube daily.   hydrALAZINE (APRESOLINE) 25 MG tablet Place 25 mg into feeding tube every 8 (eight) hours.   insulin detemir (LEVEMIR) 100 UNIT/ML injection Inject 18 Units into the skin 2 (two) times daily.   magnesium hydroxide (MILK OF MAGNESIA) 400 MG/5ML suspension If no BM  in 3 days, give 30 cc Milk of Magnesium p.o. x 1 dose in 24 hours as needed (Do not use standing constipation orders for residents with renal failure CFR less than 30. Contact MD for orders)   metformin (FORTAMET) 500 MG (OSM) 24 hr tablet Take 500 mg by mouth. TAKE 1 TABLET VIA G-TUBE TWICE A DAY FOR DM   metoprolol tartrate (LOPRESSOR) 25 MG tablet Place 25 mg into feeding tube 2 (two) times daily.    moxifloxacin (AVELOX) 400 MG tablet Place 1 tablet (400 mg total) into feeding tube daily at 8 pm for 7 days.   Nutritional Supplements (FEEDING SUPPLEMENT, GLUCERNA 1.2 CAL,) LIQD Place into feeding tube every 6 (six) hours. Place at 60 ml/hr via PEG. Water flush to 125 ml Q6h.   pantoprazole sodium (PROTONIX) 40 mg/20 mL PACK Place 20 mLs (40 mg total) into feeding tube daily.   polyethylene glycol (MIRALAX / GLYCOLAX) 17 g packet Place 17 g into feeding tube daily as needed for mild constipation.   rosuvastatin (CRESTOR) 5 MG tablet Place 1 tablet (5 mg total) into feeding tube daily.   saccharomyces boulardii (FLORASTOR) 250 MG capsule Give 250mg  via PEG tube BID for 10 days   Sodium Phosphates (RA SALINE ENEMA RE) Place rectally. If not relieved by Biscodyl suppository, give disposable Saline Enema rectally X 1 dose/24 hrs as needed (Do not use constipation standing orders for residents with renal failure/CFR less than 30. Contact MD for orders   No facility-administered encounter medications on file as of 03/24/2021.    Review of Systems  Unable to obtain due to non-verbal.   Immunization History  Administered Date(s) Administered   Influenza, High Dose Seasonal PF 05/04/2016   Influenza-Unspecified 06/17/2020   PFIZER(Purple Top)SARS-COV-2 Vaccination 10/03/2019, 10/24/2019   Pneumococcal Conjugate-13 09/01/2020   Pneumococcal Polysaccharide-23 08/29/2003, 06/30/2009   Unspecified SARS-COV-2 Vaccination 08/31/2020   Zoster, Live 01/29/2006   Pertinent  Health Maintenance Due  Topic Date Due   FOOT EXAM  Never done   OPHTHALMOLOGY EXAM  Never done   COLONOSCOPY (Pts 45-41yrs Insurance coverage will need to be confirmed)  Never done   MAMMOGRAM  Never done   DEXA SCAN  Never done   INFLUENZA VACCINE  03/28/2021   HEMOGLOBIN A1C  08/23/2021   PNA vac Low Risk Adult (2 of 2 - PPSV23) 09/01/2021   No flowsheet data found.   Vitals:   03/24/21 1143  BP: 124/69  Pulse: 76   Resp: 18  Temp: (!) 96.5 F (35.8 C)  Weight: 130 lb 11.2 oz (59.3 kg)  Height: 5\' 1"  (1.549 m)   Body mass index is 24.7 kg/m.  Physical Exam  GENERAL APPEARANCE: Well nourished. In no acute distress. Normal body habitus SKIN:  Skin is warm and dry.  MOUTH and THROAT: Lips are without lesions. Oral mucosa is moist and without lesions.  RESPIRATORY: Breathing is even & unlabored, BS CTAB CARDIAC: RRR, no murmur,no extra heart sounds, no edema GI: Abdomen soft, normal BS, +peg tube NEUROLOGICAL: There is no tremor. Non-verbal. PSYCHIATRIC:  Affect and behavior are appropriate  Labs reviewed: Recent Labs    04/04/20 0315 04/05/20 0352 04/05/20 0942 04/06/20 0408 04/07/20 0555 04/09/20 0819 04/10/20 0251 04/22/20 0907 04/23/20 0314 04/26/20 0101 09/15/20 0000 03/15/21 0000  NA 128* 128*  --  137   < > 146*   < > 142 141 138 138 139  K 4.7 4.2  --  3.6   < > 3.7   < >  4.3 3.9 3.8 3.8 4.3  CL 93* 94*  --  102   < > 111   < > 107 107 107 102 102  CO2 20* 19*  --  21*   < > 24   < > 27 25 21* 24* 23*  GLUCOSE 219* 137*  --  115*   < > 178*   < > 176* 134* 181*  --   --   BUN 63* 77*  --  65*   < > 39*   < > 44* 36* 22 26* 31*  CREATININE 1.42* 1.87*  --  1.57*   < > 0.99   < > 0.64 0.59 0.56 0.4* 0.5  CALCIUM 9.7 9.2  --  9.1   < > 9.2   < > 8.9 8.8* 8.7* 9.6 10.2  MG 2.0 2.1  --  2.1  --   --   --   --   --   --   --   --   PHOS 3.7  --  4.5 4.1  --  3.5  --   --   --   --   --   --    < > = values in this interval not displayed.   Recent Labs    04/04/20 0315 09/15/20 0000 03/15/21 0000  AST  --  10* 10*  ALT  --  13 8  ALKPHOS  --  82 94  ALBUMIN 2.5* 3.3* 3.6   Recent Labs    04/20/20 0256 04/23/20 0314 04/26/20 0101 09/15/20 0000 03/15/21 0000  WBC 12.1* 9.1 9.6 11.8 9.1  NEUTROABS 8.5* 4.9  --  8.70  --   HGB 8.6* 7.4* 8.3* 9.9* 11.5*  HCT 28.9* 24.7* 26.1* 30* 33*  MCV 98.0 98.4 97.0  --   --   PLT 385 312 325 305 289   Lab Results  Component  Value Date   TSH 2.232 03/08/2020   Lab Results  Component Value Date   HGBA1C 6.7 02/21/2021   Lab Results  Component Value Date   CHOL 131 03/15/2021   HDL 37 03/15/2021   LDLCALC 57 03/15/2021   TRIG 186 (A) 03/15/2021   CHOLHDL 4.6 02/16/2020    Significant Diagnostic Results in last 30 days:  No results found.  Assessment/Plan  1. Aspiration pneumonia of both lungs due to gastric secretions, unspecified part of lung (HCC) -   will start on Avelox 400 mg daily X 7 days and Probiotic 250 mg BID X 10 days  2. History of CVA (cerebrovascular accident) -  continue rosuvastatin 5 mg daily and Plavix 75 mg daily  3. Dysphagia, unspecified type -   Continue PEG tube feeding of Glucerna 1.2 Cal at 60 mL/hour via PEG tube -   Aspiration precautions    Family/ staff Communication: Discussed plan of care with charge nurse.  Labs/tests ordered: None  Goals of care:   Long-term care   Kenard Gower, DNP, MSN, FNP-BC [facility] and Adult Medicine [phone] (Monday-Friday 8:00 a.m. - 5:00 p.m.) [phone] (after hours)

## 2021-03-28 LAB — CBC: RBC: 4.01 (ref 3.87–5.11)

## 2021-03-28 LAB — CBC AND DIFFERENTIAL
HCT: 35 — AB (ref 36–46)
Hemoglobin: 11.6 — AB (ref 12.0–16.0)
Platelets: 337 (ref 150–399)
WBC: 14.3

## 2021-04-05 LAB — CBC AND DIFFERENTIAL
HCT: 34 — AB (ref 36–46)
Hemoglobin: 10.9 — AB (ref 12.0–16.0)
Platelets: 324 (ref 150–399)
WBC: 12.9

## 2021-04-05 LAB — CBC: RBC: 3.94 (ref 3.87–5.11)

## 2021-04-14 ENCOUNTER — Non-Acute Institutional Stay (SKILLED_NURSING_FACILITY): Payer: Self-pay | Admitting: Internal Medicine

## 2021-04-14 ENCOUNTER — Encounter: Payer: Self-pay | Admitting: Internal Medicine

## 2021-04-14 DIAGNOSIS — N179 Acute kidney failure, unspecified: Secondary | ICD-10-CM

## 2021-04-14 DIAGNOSIS — R1312 Dysphagia, oropharyngeal phase: Secondary | ICD-10-CM

## 2021-04-14 DIAGNOSIS — I10 Essential (primary) hypertension: Secondary | ICD-10-CM

## 2021-04-14 DIAGNOSIS — E1151 Type 2 diabetes mellitus with diabetic peripheral angiopathy without gangrene: Secondary | ICD-10-CM

## 2021-04-14 NOTE — Progress Notes (Signed)
   NURSING HOME LOCATION:  [facility] ROOM NUMBER:  221 B  CODE STATUS:  Full  PCP:  Douglass Rivers MD  This is a nursing facility follow up visit of chronic medical diagnoses & to document compliance with Regulation 483.30 (c) in The Long Term Care Survey Manual Phase 2 which mandates caregiver visit ( visits can alternate among physician, PA or NP as per statutes) within 10 days of 30 days / 60 days/ 90 days post admission to SNF date    Interim medical record and care since last SNF visit was updated with review of diagnostic studies and change in clinical status since last visit were documented.  HPI: She is a permanent resident of this facility admitted originally 8/31 following hospitalization with acute encephalopathy in the context of AKI and lactic acidosis.  Acute left pontine infarct and acute small infarct in the posterior lateral ventricle were documented on MRI.  The stroke was complicated by severe dysphagia and recurrent aspiration pneumonia.  PEG was placed as prophylactic maneuver against aspiration.  Review of systems: Non verbal state precluded history.  Physical exam:  Pertinent or positive findings: As noted the patient is nonverbal.  When stimulated she did open her eyes and looked in my direction but had difficulty focusing.  She had intermittent nonsustained lateral nystagmus of the left eye.  Hair is disheveled.  Mouth is agape and tongue protrudes.  First sound is accentuated.  Breath sounds are decreased. PEG present. Posterior tibial pulses are stronger than the dorsalis pedis pulses.  She has a repetitive flexion tremor of the fingers of the left hand.  Hands are in braces.  Her feet are extended.  The toenails are deformed.  When the left foot was stimulated the toes were upgoing.  General appearance: no acute distress, increased work of breathing is present.   Lymphatic: No lymphadenopathy about the head, neck, axilla. Eyes: No  conjunctival inflammation or lid edema is present. There is no scleral icterus. Ears:  External ear exam shows no significant lesions or deformities.   Nose:  External nasal examination shows no deformity or inflammation. Nasal mucosa are pink and moist without lesions, exudates Neck:  No thyromegaly, masses, tenderness noted.    Heart:  No gallop, murmur, click, rub .  Lungs:  without wheezes, rhonchi, rales, rubs. Abdomen: Bowel sounds are normal. Abdomen is soft and nontender with no organomegaly, hernias, masses. GU: Deferred  Extremities:  No cyanosis, clubbing, edema  Neurologic exam :Balance, Rhomberg, finger to nose testing could not be completed due to clinical state Skin: Warm & dry w/o tenting. No significant lesions or rash.  See summary under each active problem in the Problem List with associated updated therapeutic plan

## 2021-04-14 NOTE — Assessment & Plan Note (Signed)
BP controlled; no change in antihypertensive medications If BP rises consider low dose ARB. ACE-I intolerance was cough not angioedema

## 2021-04-14 NOTE — Assessment & Plan Note (Addendum)
Current creatinine 0.5 / GFR  > 90; WNL  Medication List reviewed; no nephrotoxic risk identified @ present doses w normal renal function

## 2021-04-14 NOTE — Assessment & Plan Note (Signed)
PEG mandatory to prevent recurrent aspiration w PNA risk

## 2021-04-14 NOTE — Assessment & Plan Note (Signed)
DM with neurovascular complications Current A1c:6.7% A1c goal : <8% No hypoglycemia No change indicated

## 2021-04-14 NOTE — Patient Instructions (Signed)
See assessment and plan under each diagnosis in the problem list and acutely for this visit 

## 2021-04-20 ENCOUNTER — Non-Acute Institutional Stay (SKILLED_NURSING_FACILITY): Payer: Self-pay | Admitting: Adult Health

## 2021-04-20 DIAGNOSIS — R1312 Dysphagia, oropharyngeal phase: Secondary | ICD-10-CM

## 2021-04-20 DIAGNOSIS — E1151 Type 2 diabetes mellitus with diabetic peripheral angiopathy without gangrene: Secondary | ICD-10-CM

## 2021-04-20 DIAGNOSIS — Z8673 Personal history of transient ischemic attack (TIA), and cerebral infarction without residual deficits: Secondary | ICD-10-CM

## 2021-04-20 DIAGNOSIS — I10 Essential (primary) hypertension: Secondary | ICD-10-CM

## 2021-04-20 DIAGNOSIS — Z7189 Other specified counseling: Secondary | ICD-10-CM

## 2021-04-20 NOTE — Progress Notes (Signed)
Location:  [facility] Room Number: 221 B Place of Service:  SNF (31) Provider:  Kenard Gower, DNP, FNP-BC  Patient Care Team: Pecola Lawless, MD as PCP - General (Internal Medicine) Medina-Vargas, Margit Banda, NP as Nurse Practitioner (Internal Medicine) Rehab, [facility] (Skilled Nursing Facility)  Extended Emergency Contact Information Primary Emergency Contact: rees, matura Mobile Phone: [phone] Relation: Son Secondary Emergency Contact: [place], [place]berry Mobile Phone: [phone] Relation: Daughter  Code Status:    Goals of care: Advanced Directive information Advanced Directives 04/14/2021  Does Patient Have a Medical Advance Directive? No  Would patient like information on creating a medical advance directive? -     Chief Complaint  Patient presents with   Acute Visit    Care Plan Meeting     HPI:  Pt is a 75 y.o. female who had a care plan meeting today attended by MDS coordinator, social worker, life enrichment, NP and son, Elanor Cale, who attended via telephone conference. She remains to be DNR. Discussed medications, vital signs and weights. Son talked about his mother as a middle school  Retail buyer. Ms. Emigh likes to play the piano so it was suggested that he can bring a piano classical CD and CD player. The meeting lasted for 20 minutes.    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   Amoxicillin-Pot Clavulanate Diarrhea   Atorvastatin Other (See Comments)    Unknown reaction - reported by [facility], [place] clinic 05/10/2012   Azithromycin Other (See Comments)    Caused blisters inside and out   Codeine Other (See Comments)    Altered mental status   Lisinopril Other (See Comments)    Unknown reaction - reported by [facility], [place] clinic 12/23/2008 02/20/2020 she believes "reaction" was NP cough. No PMH of angioedema   Sulfa Antibiotics Diarrhea    Outpatient Encounter Medications as of 04/20/2021  Medication Sig   amLODipine (NORVASC) 10 MG tablet Place 10 mg into feeding tube daily.   chlorhexidine (PERIDEX) 0.12 % solution Use as directed in the mouth or throat 2 (two) times daily.   clopidogrel (PLAVIX) 75 MG tablet Place 1 tablet (75 mg total) into feeding tube daily.   hydrALAZINE (APRESOLINE) 25 MG tablet Place 25 mg into feeding tube every 8 (eight) hours.   insulin detemir (LEVEMIR) 100 UNIT/ML injection Inject 18 Units into the skin 2 (two) times daily.   ipratropium-albuterol (DUONEB) 0.5-2.5 (3) MG/3ML SOLN Take 3 mLs by nebulization every 6 (six) hours as needed. For shortness of breath   metformin (FORTAMET) 500 MG (OSM) 24 hr tablet Take 500 mg by mouth. TAKE 1 TABLET VIA G-TUBE TWICE A DAY FOR DM   metoprolol tartrate (LOPRESSOR) 25 MG tablet Place 25 mg into feeding tube 2 (two) times daily.   Nutritional  Supplements (FEEDING SUPPLEMENT, GLUCERNA 1.2 CAL,) LIQD Place into feeding tube every 6 (six) hours. Place at 60 ml/hr via PEG. Water flush to 125 ml Q6h.   pantoprazole sodium (PROTONIX) 40 mg/20 mL PACK Place 20 mLs (40 mg total) into feeding tube daily.   polyethylene glycol (MIRALAX / GLYCOLAX) 17 g packet Place 17 g into feeding tube daily as needed for mild constipation.   rosuvastatin (CRESTOR) 5 MG tablet Place 1 tablet (5 mg total) into feeding tube daily.   No  facility-administered encounter medications on file as of 04/20/2021.    Review of Systems  Unable to obtain due to nonverbal.    Immunization History  Administered Date(s) Administered   Influenza, High Dose Seasonal PF 05/04/2016   Influenza-Unspecified 06/17/2020   PFIZER(Purple Top)SARS-COV-2 Vaccination 10/03/2019, 10/24/2019   Pneumococcal Conjugate-13 09/01/2020   Pneumococcal Polysaccharide-23 08/29/2003, 06/30/2009   Unspecified SARS-COV-2 Vaccination 08/31/2020   Zoster, Live 01/29/2006   Pertinent  Health Maintenance Due  Topic Date Due   FOOT EXAM  Never done   OPHTHALMOLOGY EXAM  Never done   COLONOSCOPY (Pts 45-53yrs Insurance coverage will need to be confirmed)  Never done   DEXA SCAN  Never done   INFLUENZA VACCINE  03/28/2021   HEMOGLOBIN A1C  08/23/2021   PNA vac Low Risk Adult (2 of 2 - PPSV23) 09/01/2021   No flowsheet data found.   Vitals:   04/20/21 1300  BP: 129/79  Pulse: 73  Resp: (!) 21  Temp: (!) 97.3 F (36.3 C)  Weight: 134 lb 12.8 oz (61.1 kg)  Height: 5\' 1"  (1.549 m)   Body mass index is 25.47 kg/m.  Physical Exam  GENERAL APPEARANCE: Well nourished. In no acute distress. Normal body habitus SKIN:  Skin is warm and dry.  MOUTH and THROAT: Lips are without lesions. Oral mucosa is moist and without lesions.  RESPIRATORY: Breathing is even & unlabored, BS CTAB CARDIAC: RRR, no murmur,no extra heart sounds, no edema GI: Abdomen soft, normal BS, no masses, no tenderness, + PEG tube NEUROLOGICAL: There is no tremor. Speech is clear. Unable to move X 4 extremities. Aphasic. PSYCHIATRIC:  Affect and behavior are appropriate  Labs reviewed: Recent Labs    09/15/20 0000 03/15/21 0000 03/24/21 0000  NA 138 139 143  K 3.8 4.3 4.2  CL 102 102 102  CO2 24* 23* 24*  BUN 26* 31* 32*  CREATININE 0.4* 0.5 0.5  CALCIUM 9.6 10.2 10.0   Recent Labs    09/15/20 0000 03/15/21 0000  AST 10* 10*  ALT 13 8  ALKPHOS 82 94  ALBUMIN 3.3* 3.6    Recent Labs    09/15/20 0000 03/15/21 0000 03/24/21 0000 03/28/21 0000 04/05/21 0000  WBC 11.8   < > 15.3 14.3 12.9  NEUTROABS 8.70  --   --   --   --   HGB 9.9*   < > 11.4* 11.6* 10.9*  HCT 30*   < > 35* 35* 34*  PLT 305   < > 325 337 324   < > = values in this interval not displayed.   Lab Results  Component Value Date   TSH 2.232 03/08/2020   Lab Results  Component Value Date   HGBA1C 6.7 02/21/2021   Lab Results  Component Value Date   CHOL 131 03/15/2021   HDL 37 03/15/2021   LDLCALC 57 03/15/2021   TRIG 186 (A) 03/15/2021   CHOLHDL 4.6 02/16/2020    Significant Diagnostic Results in  last 30 days:  No results found.  Assessment/Plan  1. ACP (advance care planning) -   Discussed medications, vital signs and weights -    Remains to be DNR  2. DM (diabetes mellitus), type 2 with peripheral vascular complications [facility]) Lab Results  Component Value Date   HGBA1C 6.7 02/21/2021   -   Continue metformin and Levemir  3. History of CVA (cerebrovascular accident) -   Stable, continue clopidogrel and rosuvastatin  4. Essential (primary) hypertension -  BPs stable, continue hydralazine and amlodipine  5. Oropharyngeal dysphagia -   Continue Glucerna 1.2 call at 60 mL/hour via PEG tube -   Aspiration precautions     Family/ staff Communication: Discussed plan of care with son and IDT.  Labs/tests ordered: None  Goals of care:   Long-term care   Kenard Gower, DNP, MSN, FNP-BC [facility] [phone] (Monday-Friday 8:00 a.m. - 5:00 p.m.) [phone] (after hours)

## 2021-04-29 ENCOUNTER — Non-Acute Institutional Stay: Payer: Self-pay | Admitting: Hospice

## 2021-04-29 ENCOUNTER — Other Ambulatory Visit: Payer: Self-pay

## 2021-04-29 DIAGNOSIS — Z515 Encounter for palliative care: Secondary | ICD-10-CM

## 2021-04-29 DIAGNOSIS — R1312 Dysphagia, oropharyngeal phase: Secondary | ICD-10-CM

## 2021-04-29 DIAGNOSIS — F0390 Unspecified dementia without behavioral disturbance: Secondary | ICD-10-CM

## 2021-04-29 NOTE — Progress Notes (Signed)
Therapist, nutritional Palliative Care Consult Note Telephone: [phone]  Fax: [phone]  PATIENT NAME: Tasha [name] DOB: [birth year] MRN: [id number]  PRIMARY CARE PROVIDER:   Pecola Lawless, MD Pecola Lawless, MD [address] [place],  [place] [address]  REFERRING PROVIDER: Pecola Lawless, MD Pecola Lawless, MD [address] [place],  [place] [address]  RESPONSIBLE PARTY:  Brad Primary Emergency Contact: ralphine, hinks Mobile Phone: [phone] Relation: Son Secondary Emergency Contact: Annika, Selke Mobile Phone: [phone] Relation: Daughter Contact Information     Name Relation Home Work [place], [name] Son   [phone]   Kora, Groom Daughter   [phone]       Visit is to build trust and highlight Palliative Medicine as specialized medical care for people living with serious illness, aimed at facilitating better quality of life through symptoms relief, assisting with advance care planning and complex medical decision making. This is a follow up visit.  RECOMMENDATIONS/PLAN:   Advance Care Planning/Code Status: Facility record indicates patient is a DO NOT RESUSCITATE  Goals of Care: Goals of care include to maximize quality of life and symptom management.  Visit consisted of counseling and education dealing with the complex and emotionally intense issues of symptom management and palliative care in the setting of serious and potentially life-threatening illness. Palliative care team will continue to support patient, patient's family, and medical team.  Symptom management/Plan:  Dysphagia: PEG tube feeding at 60 mL/h is ongoing.  Aspiration precautions in place. Dementia: Fast 7D.  Continue ongoing supportive care. Patient will benefit from hospice service when family is ready for.  Follow up: Palliative care will continue to follow for complex medical decision making, advance care planning, and  clarification of goals. Return 6 weeks or prn. Encouraged to call provider sooner with any concerns.  CHIEF COMPLAINT: Palliative follow up  HISTORY OF PRESENT ILLNESS:  Tasha [name] a 75 y.o. female with multiple medical problems including CVA with residual deficits-unable to move all extremities, dysphagia, hypertension, type 2 diabetes mellitus.  Patient unable to meaningfully interact or discussed with NP, in no acute distress, FLACC 0.  Nursing with no complaints.  History obtained from review of EMR, discussion with primary team, family and/or patient. Records reviewed and summarized above. All 10 point systems reviewed and are negative except as documented in history of present illness above  Review and summarization of Epic records shows history from other than patient.   Palliative Care was asked to follow this patient o help address complex decision making in the context of advance care planning and goals of care clarification.   PHYSICAL EXAM  General: In no acute distress, appropriately dressed Cardiovascular: regular rate and rhythm; no edema in BLE Pulmonary: no cough, no increased work of breathing, normal respiratory effort Abdomen: soft, non tender, no guarding, positive bowel sounds in all quadrants, PEG tube present GU:  no suprapubic tenderness Eyes: Normal lids, no discharge ENMT: Moist mucous membranes Musculoskeletal:  weakness, bedbound, not able to move extremities Skin: no rash to visible skin, warm without cyanosis,  Psych: non-anxious affect Neurological: Weakness but otherwise non focal, nonverbal during visit Heme/lymph/immuno: no bruises, no bleeding  PERTINENT MEDICATIONS:  Outpatient Encounter Medications as of 04/29/2021  Medication Sig   amLODipine (NORVASC) 10 MG tablet Place 10 mg into feeding tube daily.   chlorhexidine (PERIDEX) 0.12 % solution Use as directed in the mouth or throat 2 (two) times daily.   clopidogrel (PLAVIX) 75  MG tablet Place 1  tablet (75 mg total) into feeding tube daily.   hydrALAZINE (APRESOLINE) 25 MG tablet Place 25 mg into feeding tube every 8 (eight) hours.   insulin detemir (LEVEMIR) 100 UNIT/ML injection Inject 18 Units into the skin 2 (two) times daily.   ipratropium-albuterol (DUONEB) 0.5-2.5 (3) MG/3ML SOLN Take 3 mLs by nebulization every 6 (six) hours as needed. For shortness of breath   metformin (FORTAMET) 500 MG (OSM) 24 hr tablet Take 500 mg by mouth. TAKE 1 TABLET VIA G-TUBE TWICE A DAY FOR DM   metoprolol tartrate (LOPRESSOR) 25 MG tablet Place 25 mg into feeding tube 2 (two) times daily.   Nutritional Supplements (FEEDING SUPPLEMENT, GLUCERNA 1.2 CAL,) LIQD Place into feeding tube every 6 (six) hours. Place at 60 ml/hr via PEG. Water flush to 125 ml Q6h.   pantoprazole sodium (PROTONIX) 40 mg/20 mL PACK Place 20 mLs (40 mg total) into feeding tube daily.   polyethylene glycol (MIRALAX / GLYCOLAX) 17 g packet Place 17 g into feeding tube daily as needed for mild constipation.   rosuvastatin (CRESTOR) 5 MG tablet Place 1 tablet (5 mg total) into feeding tube daily.   No facility-administered encounter medications on file as of 04/29/2021.    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   Amoxicillin-Pot Clavulanate Diarrhea   Atorvastatin Other (See Comments)    Unknown reaction - reported by [facility], [place] clinic 05/10/2012   Azithromycin Other (See Comments)    Caused blisters inside and out   Codeine Other (See Comments)    Altered mental  status   Lisinopril Other (See Comments)    Unknown reaction - reported by [facility], [place] clinic 12/23/2008 02/20/2020 she believes "reaction" was NP cough. No PMH of angioedema   Sulfa Antibiotics Diarrhea      I spent 40 minutes providing this consultation; this includes time spent with patient/family, chart review and documentation. More than 50% of the time in this consultation was spent on counseling and coordinating communication   Thank you for the opportunity to participate in the care of Tasha [name] Please call our office at [phone] if we can be of additional assistance.  Note: Portions of this note were generated with Scientist, clinical (histocompatibility and immunogenetics). Dictation errors may occur despite best attempts at proofreading.  Rosaura Carpenter, NP

## 2021-06-07 ENCOUNTER — Encounter: Payer: Self-pay | Admitting: Adult Health

## 2021-06-07 ENCOUNTER — Non-Acute Institutional Stay (SKILLED_NURSING_FACILITY): Payer: Self-pay | Admitting: Adult Health

## 2021-06-07 DIAGNOSIS — Z8673 Personal history of transient ischemic attack (TIA), and cerebral infarction without residual deficits: Secondary | ICD-10-CM

## 2021-06-07 DIAGNOSIS — E1151 Type 2 diabetes mellitus with diabetic peripheral angiopathy without gangrene: Secondary | ICD-10-CM

## 2021-06-07 DIAGNOSIS — I10 Essential (primary) hypertension: Secondary | ICD-10-CM

## 2021-06-07 DIAGNOSIS — R1312 Dysphagia, oropharyngeal phase: Secondary | ICD-10-CM

## 2021-06-07 NOTE — Progress Notes (Signed)
Location:  [facility] Room Number: 221-B Place of Service:  SNF (31) Provider:  Kenard Gower, DNP, FNP-BC  Patient Care Team: Pecola Lawless, MD as PCP - General (Internal Medicine) Medina-Vargas, Margit Banda, NP as Nurse Practitioner (Internal Medicine) Rehab, [facility] ([facility])  Extended Emergency Contact Information Primary Emergency Contact: shania, bjelland Mobile Phone: [phone] Relation: Son Secondary Emergency Contact: Kalen, Neidert Mobile Phone: [phone] Relation: Daughter  Code Status:  DNR  Goals of care: Advanced Directive information Advanced Directives 06/07/2021  Does Patient Have a Medical Advance Directive? Yes  Type of Advance Directive Out of facility DNR (pink MOST or yellow form)  Does patient want to make changes to medical advance directive? No - Patient declined  Would patient like information on creating a medical advance directive? -     Chief Complaint  Patient presents with   Medical Management of Chronic Issues    Routine Visit.     HPI:  Pt is a 75 y.o. female seen today for medical management of chronic diseases. She is a long-term care resident of [facility]. She has a PMH of Dyslipidemia, MI and essential hypertension. SBPs ranging from 113-145, with outlier 156 and 101.  She states amlodipine 10 mg daily, metoprolol tartrate 25 mg twice a day and hydralazine 25 mg every 8 hours for hypertension.  CBGs ranging in 119-200, with outlier 78 and 235.  She takes Levemir 15 units twice a day and metformin 500 mg twice a day for diabetes mellitus. 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 She is on peg tube feedings due to dysphagia.    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   Amoxicillin-Pot Clavulanate Diarrhea   Atorvastatin Other (See Comments)    Unknown reaction - reported by [facility], [place] clinic 05/10/2012   Azithromycin Other (See Comments)    Caused blisters inside and out   Codeine Other (See Comments)    Altered mental status   Lisinopril Other (See Comments)    Unknown reaction - reported by [facility], [place] clinic 12/23/2008 02/20/2020 she believes "reaction" was NP cough. No PMH of angioedema   Sulfa Antibiotics Diarrhea    Outpatient Encounter Medications as of 06/07/2021  Medication Sig   Albuterol Sulfate 2.5 MG/0.5ML NEBU Inhale into the lungs every 6 (six) hours as needed.   amLODipine (NORVASC) 10 MG tablet Place 10 mg into feeding tube daily.   barrier cream (NON-SPECIFIED) CREA Apply 1 application topically 2 (two) times daily as needed (for protection.).   chlorhexidine (PERIDEX) 0.12 % solution Use as directed in the mouth or throat 2 (two) times daily.   clopidogrel (PLAVIX) 75 MG tablet Place 1 tablet (75 mg total) into feeding tube daily.  hydrALAZINE (APRESOLINE) 25 MG tablet Place 25 mg into feeding tube every 8 (eight) hours.   insulin detemir (LEVEMIR) 100 UNIT/ML injection Inject 18 Units into the skin 2 (two) times daily.   metformin (FORTAMET) 500 MG (OSM) 24 hr tablet Take 500 mg by mouth. TAKE 1 TABLET VIA G-TUBE TWICE A DAY FOR DM   metoprolol tartrate (LOPRESSOR) 25 MG tablet Place 25 mg into feeding tube 2 (two) times daily.   Nutritional Supplements (FEEDING SUPPLEMENT,  GLUCERNA 1.2 CAL,) LIQD Place into feeding tube every 6 (six) hours. Place at 60 ml/hr via PEG. Water flush to 125 ml Q6h.   pantoprazole sodium (PROTONIX) 40 mg/20 mL PACK Place 20 mLs (40 mg total) into feeding tube daily.   polyethylene glycol (MIRALAX / GLYCOLAX) 17 g packet Place 17 g into feeding tube daily as needed for mild constipation.   rosuvastatin (CRESTOR) 5 MG tablet Place 1 tablet (5 mg total) into feeding tube daily.   [DISCONTINUED] ipratropium-albuterol (DUONEB) 0.5-2.5 (3) MG/3ML SOLN Take 3 mLs by nebulization every 6 (six) hours as needed. For shortness of breath   No facility-administered encounter medications on file as of 06/07/2021.    Review of Systems unable to obtain due to nonverbal.    Immunization History  Administered Date(s) Administered   Influenza, High Dose Seasonal PF 05/04/2016   Influenza-Unspecified 06/17/2020   PFIZER(Purple Top)SARS-COV-2 Vaccination 10/03/2019, 10/24/2019   Pneumococcal Conjugate-13 09/01/2020   Pneumococcal Polysaccharide-23 08/29/2003, 06/30/2009   Unspecified SARS-COV-2 Vaccination 08/31/2020   Zoster, Live 01/29/2006   Pertinent  Health Maintenance Due  Topic Date Due   FOOT EXAM  Never done   OPHTHALMOLOGY EXAM  Never done   COLONOSCOPY (Pts 45-62yrs Insurance coverage will need to be confirmed)  Never done   DEXA SCAN  Never done   INFLUENZA VACCINE  03/28/2021   HEMOGLOBIN A1C  08/23/2021   No flowsheet data found.   Vitals:   06/07/21 1053  BP: (!) 145/71  Pulse: 70  Resp: (!) 21  Temp: (!) 97.3 F (36.3 C)  Height: 5\' 1"  (1.549 m)   Body mass index is 25.47 kg/m.  Physical Exam  GENERAL APPEARANCE: Well nourished. In no acute distress. Normal body habitus SKIN:  Skin is warm and dry.  MOUTH and THROAT: Lips are without lesions. Oral mucosa is moist and without lesions.  RESPIRATORY: Breathing is even & unlabored, BS CTAB CARDIAC: RRR, no murmur,no extra heart sounds, no edema GI: Abdomen soft,  normal BS, no masses, no tenderness NEUROLOGICAL: Quadriplegic, nonverbal PSYCHIATRIC:  Affect and behavior are appropriate  Labs reviewed: Recent Labs    09/15/20 0000 03/15/21 0000 03/24/21 0000  NA 138 139 143  K 3.8 4.3 4.2  CL 102 102 102  CO2 24* 23* 24*  BUN 26* 31* 32*  CREATININE 0.4* 0.5 0.5  CALCIUM 9.6 10.2 10.0   Recent Labs    09/15/20 0000 03/15/21 0000  AST 10* 10*  ALT 13 8  ALKPHOS 82 94  ALBUMIN 3.3* 3.6   Recent Labs    09/15/20 0000 03/15/21 0000 03/24/21 0000 03/28/21 0000 04/05/21 0000  WBC 11.8   < > 15.3 14.3 12.9  NEUTROABS 8.70  --   --   --   --   HGB 9.9*   < > 11.4* 11.6* 10.9*  HCT 30*   < > 35* 35* 34*  PLT 305   < > 325 337 324   < > = values in this interval not displayed.  Lab Results  Component Value Date   TSH 2.232 03/08/2020   Lab Results  Component Value Date   HGBA1C 6.7 02/21/2021   Lab Results  Component Value Date   CHOL 131 03/15/2021   HDL 37 03/15/2021   LDLCALC 57 03/15/2021   TRIG 186 (A) 03/15/2021   CHOLHDL 4.6 02/16/2020    Significant Diagnostic Results in last 30 days:  No results found.  Assessment/Plan  1. Essential (primary) hypertension -   Stable, continue amlodipine, hydralazine and metoprolol tartrate  2. History of CVA (cerebrovascular accident) -  stable, continue Plavix  3. DM (diabetes mellitus), type 2 with peripheral vascular complications (HCC) Lab Results  Component Value Date   HGBA1C 6.7 02/21/2021   -   CBGs stable, continue Levemir and Metformin  4. Oropharyngeal dysphagia -   NPO -  continue Glucerna 1.2 cal @ 60 ml/hour -   aspiration precautions     Family/ staff Communication:   Discussed plan of care with charge nurse.  Labs/tests ordered:  None  Goals of care:   Long-term care/Palliative Care   Kenard Gower, DNP, MSN, FNP-BC [facility] [phone] (Monday-Friday 8:00 a.m. - 5:00 p.m.) [phone] (after  hours)

## 2021-06-13 ENCOUNTER — Other Ambulatory Visit (HOSPITAL_COMMUNITY): Payer: Self-pay | Admitting: Internal Medicine

## 2021-06-13 DIAGNOSIS — E46 Unspecified protein-calorie malnutrition: Secondary | ICD-10-CM

## 2021-06-15 ENCOUNTER — Ambulatory Visit (HOSPITAL_COMMUNITY): Admission: RE | Admit: 2021-06-15 | Payer: Self-pay | Source: Ambulatory Visit

## 2021-06-21 ENCOUNTER — Ambulatory Visit (HOSPITAL_COMMUNITY)
Admission: RE | Admit: 2021-06-21 | Discharge: 2021-06-21 | Disposition: A | Discharge status: Home / Self Care | Payer: Self-pay | Source: Ambulatory Visit | Attending: Internal Medicine | Admitting: Internal Medicine

## 2021-06-21 ENCOUNTER — Ambulatory Visit (HOSPITAL_COMMUNITY)
Admission: RE | Admit: 2021-06-21 | Discharge: 2021-06-21 | Disposition: A | Discharge status: Home / Self Care | Payer: Self-pay | Source: Ambulatory Visit | Attending: Physician Assistant | Admitting: Physician Assistant

## 2021-06-21 ENCOUNTER — Other Ambulatory Visit: Payer: Self-pay

## 2021-06-21 ENCOUNTER — Other Ambulatory Visit (HOSPITAL_COMMUNITY): Payer: Self-pay | Admitting: Internal Medicine

## 2021-06-21 DIAGNOSIS — Z431 Encounter for attention to gastrostomy: Secondary | ICD-10-CM | POA: Insufficient documentation

## 2021-06-21 DIAGNOSIS — E46 Unspecified protein-calorie malnutrition: Secondary | ICD-10-CM

## 2021-06-21 DIAGNOSIS — Z09 Encounter for follow-up examination after completed treatment for conditions other than malignant neoplasm: Secondary | ICD-10-CM

## 2021-06-21 DIAGNOSIS — R1310 Dysphagia, unspecified: Secondary | ICD-10-CM | POA: Insufficient documentation

## 2021-06-21 HISTORY — PX: IR REPLACE G-TUBE SIMPLE WO FLUORO: IMG2323

## 2021-06-21 MED ORDER — DIATRIZOATE MEGLUMINE & SODIUM 66-10 % PO SOLN
ORAL | Status: AC
  Administered 2021-06-21: 30 mL via GASTROSTOMY
  Filled 2021-06-21: qty 30

## 2021-06-21 MED ORDER — LIDOCAINE VISCOUS HCL 2 % MT SOLN
OROMUCOSAL | Status: AC
  Filled 2021-06-21: qty 15

## 2021-06-21 NOTE — Procedures (Signed)
Pre procedural Dx: Dysphagia, clogged g-tube Post procedural Dx: Same  Existing 20 Fr pull through G-tube noted to have hub cut and removed prior to arrival from facility. Successful bedside removal of existing 20 Fr pull through gastrostomy tube using manual traction. The internal retention portion was dislodged during removal -- per facility patient has normal BMs, no colostomy/ileostomy present. They will monitor BMs to ensure internal retention portion has passed and will call IR or present to ED if patient develops abdominal pain, n/v, constipation worse than baseline.  Successful bedside placement of 20 Fr balloon retention G-tube, balloon inflated with 8 cc NS. KUB with gastrograffin injection confirms location within the gastric lumen The feeding tube is ready for immediate use.  EBL < 1 mL  Complications: dislodgement of internal retention bumper during removal - facility to monitor  Lynnette Caffey, PA-C

## 2021-06-29 ENCOUNTER — Encounter: Payer: Self-pay | Admitting: Adult Health

## 2021-06-29 ENCOUNTER — Non-Acute Institutional Stay (SKILLED_NURSING_FACILITY): Payer: Self-pay | Admitting: Adult Health

## 2021-06-29 DIAGNOSIS — E1151 Type 2 diabetes mellitus with diabetic peripheral angiopathy without gangrene: Secondary | ICD-10-CM

## 2021-06-29 DIAGNOSIS — Z66 Do not resuscitate: Secondary | ICD-10-CM

## 2021-06-29 DIAGNOSIS — I10 Essential (primary) hypertension: Secondary | ICD-10-CM

## 2021-06-29 DIAGNOSIS — R1312 Dysphagia, oropharyngeal phase: Secondary | ICD-10-CM

## 2021-06-29 DIAGNOSIS — Z8673 Personal history of transient ischemic attack (TIA), and cerebral infarction without residual deficits: Secondary | ICD-10-CM

## 2021-06-29 DIAGNOSIS — Z7189 Other specified counseling: Secondary | ICD-10-CM

## 2021-06-29 NOTE — Progress Notes (Signed)
Location:  [facility] Room Number: 221-B Place of Service:  SNF (31) Provider:  Kenard Gower, DNP, FNP-BC  Patient Care Team: Pecola Lawless, MD as PCP - General (Internal Medicine) Medina-Vargas, Margit Banda, NP as Nurse Practitioner (Internal Medicine) Rehab, [facility] (Skilled Nursing Facility)  Extended Emergency Contact Information Primary Emergency Contact: eddith, mentor Mobile Phone: [phone] Relation: Son Secondary Emergency Contact: Jackelin, Correia Mobile Phone: [phone] Relation: Daughter  Code Status:  DNR  Goals of care: Advanced Directive information Advanced Directives 06/29/2021  Does Patient Have a Medical Advance Directive? Yes  Type of Advance Directive Out of facility DNR (pink MOST or yellow form)  Does patient want to make changes to medical advance directive? No - Patient declined  Would patient like information on creating a medical advance directive? -  Pre-existing out of facility DNR order (yellow form or pink MOST form) Yellow form placed in chart (order not valid for inpatient use)     Chief Complaint  Patient presents with   Advanced Directive    Care plan meeting     HPI:  Pt is a 75 y.o. female who had a care plan meeting attended by NP, life enrichment, dietitian and Child psychotherapist. Son was called via telephone but did not answer. Social worker will call son again and update what was discussed in the meeting. She remains to be DNR. Discussed medications, vital signs and weights. She is dependent on staff for care. She participates in restorative PROM program to BUE and splint/brace care. Last A1c 6.7, 02/15/21. CBGs stable. She is being followed by palliative care. The meeting lasted for 20 minutes.                                                                                                                                                                     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   Amoxicillin-Pot Clavulanate Diarrhea   Atorvastatin Other (See Comments)    Unknown reaction -  reported by [place], [place] clinic 05/10/2012   Azithromycin Other (See Comments)    Caused blisters inside and out   Codeine Other (See Comments)    Altered mental status   Lisinopril Other (See Comments)    Unknown reaction - reported by [facility], [place] clinic 12/23/2008 02/20/2020 she believes "reaction" was NP cough. No PMH of angioedema   Sulfa Antibiotics Diarrhea    Outpatient Encounter Medications as of 06/29/2021  Medication Sig   Albuterol Sulfate 2.5 MG/0.5ML NEBU Inhale into the lungs every 6 (six) hours as needed.   amLODipine (NORVASC) 10 MG tablet Place 10 mg into feeding tube daily.   barrier cream (NON-SPECIFIED) CREA Apply 1 application topically 2 (two) times daily as needed (for protection.).   chlorhexidine (PERIDEX) 0.12 % solution Use as directed in the mouth or throat 2 (two) times daily.   clopidogrel (PLAVIX) 75 MG tablet Place 1 tablet (75 mg total) into feeding tube daily.   hydrALAZINE (APRESOLINE) 25 MG tablet Place 25 mg into feeding tube every 8 (eight) hours.   insulin detemir (LEVEMIR) 100 UNIT/ML injection Inject 15 Units into the skin 2 (two) times daily.   metformin (FORTAMET) 500 MG (OSM) 24 hr tablet Take 500 mg by  mouth 2 (two) times daily with a meal. VIA G-TUBE FOR DM   metoprolol tartrate (LOPRESSOR) 25 MG tablet Place 25 mg into feeding tube 2 (two) times daily.   Nutritional Supplements (FEEDING SUPPLEMENT, GLUCERNA 1.2 CAL,) LIQD Place into feeding tube every 6 (six) hours. Place at 60 ml/hr via PEG. Water flush to 125 ml Q6h.   pantoprazole sodium (PROTONIX) 40 mg/20 mL PACK Place 20 mLs (40 mg total) into feeding tube daily.   polyethylene glycol (MIRALAX / GLYCOLAX) 17 g packet Place 17 g into feeding tube daily as needed for mild constipation.   rosuvastatin (CRESTOR) 5 MG tablet Place 1 tablet (5 mg total) into feeding tube daily.   No facility-administered encounter medications on file as of 06/29/2021.    Review of Systems  Unable to obtain due to non-verbal.   Immunization History  Administered Date(s) Administered   Influenza, High Dose Seasonal PF 05/04/2016   Influenza-Unspecified 06/17/2020   PFIZER(Purple Top)SARS-COV-2 Vaccination 10/03/2019, 10/24/2019   Pneumococcal Conjugate-13 09/01/2020   Pneumococcal Polysaccharide-23 08/29/2003, 06/30/2009   Unspecified SARS-COV-2 Vaccination 08/31/2020   Zoster, Live 01/29/2006   Pertinent  Health Maintenance Due  Topic Date Due   FOOT EXAM  Never done   OPHTHALMOLOGY EXAM  Never done   COLONOSCOPY (Pts 45-54yrs Insurance coverage will need to be confirmed)  Never done   DEXA SCAN  Never done   INFLUENZA VACCINE  03/28/2021   HEMOGLOBIN A1C  08/23/2021   Fall Risk 04/24/2020 04/25/2020 04/26/2020 04/26/2020 04/27/2020  Patient Fall Risk Level High fall risk High fall risk High fall risk High fall risk High fall risk     Vitals:   06/29/21 1101  BP: 124/80  Pulse: 90  Resp: 18  Temp: (!) 96.4 F (35.8 C)  Weight: 140 lb 3.2 oz (63.6 kg)  Height: 5\' 1"  (1.549 m)   Body mass index is 26.49 kg/m.  Physical Exam  GENERAL APPEARANCE: Well nourished. In no acute distress.  SKIN:  Skin is warm and dry.  MOUTH and THROAT: Lips  are without lesions. Oral mucosa is moist and without lesions.  RESPIRATORY: Breathing is even & unlabored, BS CTAB CARDIAC: RRR, no murmur,no extra heart sounds, no edema GI: +peg tube NEUROLOGICAL: There is no  tremor. Nonverbal. PSYCHIATRIC:  Affect and behavior are appropriate  Labs reviewed: Recent Labs    09/15/20 0000 03/15/21 0000 03/24/21 0000  NA 138 139 143  K 3.8 4.3 4.2  CL 102 102 102  CO2 24* 23* 24*  BUN 26* 31* 32*  CREATININE 0.4* 0.5 0.5  CALCIUM 9.6 10.2 10.0   Recent Labs    09/15/20 0000 03/15/21 0000  AST 10* 10*  ALT 13 8  ALKPHOS 82 94  ALBUMIN 3.3* 3.6   Recent Labs    09/15/20 0000 03/15/21 0000 03/24/21 0000 03/28/21 0000 04/05/21 0000  WBC 11.8   < > 15.3 14.3 12.9  NEUTROABS 8.70  --   --   --   --   HGB 9.9*   < > 11.4* 11.6* 10.9*  HCT 30*   < > 35* 35* 34*  PLT 305   < > 325 337 324   < > = values in this interval not displayed.   Lab Results  Component Value Date   TSH 2.232 03/08/2020   Lab Results  Component Value Date   HGBA1C 6.7 02/21/2021   Lab Results  Component Value Date   CHOL 131 03/15/2021   HDL 37 03/15/2021   LDLCALC 57 03/15/2021   TRIG 186 (A) 03/15/2021   CHOLHDL 4.6 02/16/2020    Significant Diagnostic Results in last 30 days:  IR REPLACE G-TUBE SIMPLE WO FLUORO  Result Date: 06/21/2021 INDICATION: Patient with 20 Fr percutaneous gastrostomy tube placed 03/31/20 in IR who presents today from SNF due to g-tube malfunction. Patient is nonverbal, per facility notes g-tube has been clogged and hub was cut to attempt to fix this issue. Request for removal of existing damaged g-tube and replacement with new balloon retention g-tube for on going nutrition needs. EXAM: BEDSIDE REMOVAL OF EXISTING GASTROSTOMY TUBE AND REPLACEMENT OF GASTROSTOMY TUBE COMPARISON:  None. MEDICATIONS: 10 mL viscous lidocaine CONTRAST:  - administered into the gastric lumen FLUOROSCOPY TIME:  None COMPLICATIONS: SIR Level A - No  therapy, no consequence. Internal retention bumper of existing dislodged within gastric lumen during removal, patient with normal BMs and no colostomy/ileostomy. Facility to monitor. PROCEDURE: The upper abdomen and external portion of the existing gastrostomy tube was prepped and draped in the usual sterile fashion, and a sterile drape was applied covering the operative field. Maximum barrier sterile technique with sterile gowns and gloves were used for the procedure. A timeout was performed prior to the initiation of the procedure. The existing gastrostomy tube was removed with manual traction, the internal bumper portion was noted to be dislodged during removal. Next, a new 20-French balloon retention gastrostomy tube was placed within the existing tract without difficulty. The balloon was inflated with saline and pulled against the anterior inner lumen of the stomach and the external disc was cinched. KUB with contrast injection was obtained confirming appropriate positioning and functionality of the new gastrostomy tube. A dressing was applied. The patient tolerated the procedure well without immediate postprocedural complication. IMPRESSION: Successful bedside removal of existing damaged 20 Fr pull through gastrostomy tube and replacement of a new 20-French balloon retention gastrostomy tube. The gastrostomy tube is ready for immediate use. Read by Candiss Norse, PA-C Electronically Signed   By: Ruthann Cancer M.D.   On: 06/21/2021 11:04   DG ABDOMEN PEG TUBE LOCATION  Result Date: 06/21/2021 CLINICAL DATA:  Peg tube replacement EXAM: ABDOMEN - 1 VIEW COMPARISON:  Portable exam 0943 hours compared to 04/04/2020 FINDINGS: Peg tube  located within stomach. Ring shaped filling defect within stomach measuring 3.4 cm diameter, likely representing residual bumper from prior PEG tube. No contrast extravasation. Bowel gas pattern normal. IMPRESSION: Replaced PEG tube is within gastric lumen without  extravasation of contrast. Ring shaped filling defect 3.4 cm diameter within stomach likely representing bumper from prior PEG tube. Electronically Signed   By: Lavonia Dana M.D.   On: 06/21/2021 10:20    Assessment/Plan  1. ACP (advance care planning) -  remains to be DNR  -   Discussed medications, vital signs and weights  2. DNR (do not resuscitate) - Do not attempt resuscitation (DNR) -Followed by palliative care  3. DM (diabetes mellitus), type 2 with peripheral vascular complications (HCC) Lab Results  Component Value Date   HGBA1C 6.7 02/21/2021   -   Continue Levemir 15 units twice a day and metformin 500 mg twice a day -   Continue CBG checks  4. History of CVA (cerebrovascular accident) -Stable, continue Plavix 75 mg daily and rosuvastatin 5 mg daily  5. Oropharyngeal dysphagia -  NPO, oral care daily -  Continue Glucerna 1.2 call 60 mL/hour via PEG tube -   Aspiration precautions  6. Essential (primary) hypertension -  BPs stable, continue hydralazine 25 mg every 8 hours, amlodipine 10 mg daily and metoprolol tartrate 25 mg twice a day   Family/ staff Communication:   Discussed plan of care with IDT.  Labs/tests ordered: None  Goals of care:   Long-term care/palliative care   Durenda Age, DNP, MSN, FNP-BC [facility] and Adult Medicine [phone] (Monday-Friday 8:00 a.m. - 5:00 p.m.) [phone] (after hours)

## 2021-07-12 ENCOUNTER — Non-Acute Institutional Stay (SKILLED_NURSING_FACILITY): Payer: Self-pay | Admitting: Internal Medicine

## 2021-07-12 ENCOUNTER — Encounter: Payer: Self-pay | Admitting: Internal Medicine

## 2021-07-12 DIAGNOSIS — E1151 Type 2 diabetes mellitus with diabetic peripheral angiopathy without gangrene: Secondary | ICD-10-CM

## 2021-07-12 DIAGNOSIS — N179 Acute kidney failure, unspecified: Secondary | ICD-10-CM

## 2021-07-12 DIAGNOSIS — D649 Anemia, unspecified: Secondary | ICD-10-CM

## 2021-07-12 DIAGNOSIS — Z8673 Personal history of transient ischemic attack (TIA), and cerebral infarction without residual deficits: Secondary | ICD-10-CM

## 2021-07-12 NOTE — Progress Notes (Signed)
   NURSING HOME LOCATION:  [facility] ROOM NUMBER:  [id number]  CODE STATUS:  DNR  PCP:   Douglass Rivers MD  This is a nursing facility follow up visit of chronic medical diagnoses & to document compliance with Regulation 483.30 (c) in The Long Term Care Survey Manual Phase 2 which mandates caregiver visit ( visits can alternate among physician, PA or NP as per statutes) within 10 days of 30 days / 60 days/ 90 days post admission to SNF date    Interim medical record and care since last SNF visit was updated with review of diagnostic studies and change in clinical status since last visit were documented.  HPI: She is a permanent resident of facility with diabetes with neurologic complications, essential hypertension, & atherosclerosis with history of MI and history of stroke.  On 03/24/2021 creatinine was 0.48 with GFR greater than 90 indicating normal renal function.  Prerenal azotemia was present with a BUN of 32.2.  On 8/9 there had been slight progression of anemia with H/H of 10.9/34; values on 7/28 had been 11.8/34.7.  Indices were normochromic, normocytic.  On 7/19 LDL was at goal with a value of 57.  A1c on 6/27 was 6.7% indicating excellent diabetic control.  Review of systems: Nonverbal state precluded completion of review of systems.  Physical exam:  Pertinent or positive findings: She slept throughout the exam.  The only time she opened her eyes voluntarily was when I attempted to turn off the light.  Both then and when I lifted her eyebrows she stared blankly ahead without focusing.  She exhibits somewhat of a grimacing facies.  Mouth is agape.  Teeth are coated and the tongue is dry.  Breath sounds are decreased.  Feeding tube is present.  Pedal pulses are not palpable.  The forearms and hands are in braces.  There is a flexion tremor of the left index finger and thumb intermittently.  There was a faint tremor of the left foot.  Toes on the lower right foot are  flexed.  There are isolated deformities of the toenails particularly on the left foot.  General appearance: no acute distress, increased work of breathing is present.   Lymphatic: No lymphadenopathy about the head, neck, axilla. Eyes: No conjunctival inflammation or lid edema is present. There is no scleral icterus. Ears:  External ear exam shows no significant lesions or deformities.   Nose:  External nasal examination shows no deformity or inflammation. Nasal mucosa are pink and moist without lesions, exudates Neck:  No thyromegaly, masses, tenderness noted.    Heart:  No gallop, murmur, click, rub .  Lungs: without wheezes, rhonchi, rales, rubs. Abdomen: Bowel sounds are normal. Abdomen is soft and nontender with no organomegaly, hernias, masses. GU: Deferred  Extremities:  No cyanosis, clubbing, edema  Skin: Warm & dry w/o tenting. No significant lesions or rash.  See summary under each active problem in the Problem List with associated updated therapeutic plan

## 2021-07-12 NOTE — Assessment & Plan Note (Signed)
She is verbally unresponsive and follows no commands.  There was no voluntary activity beyond opening her eyes when I turned off the light and facial grimacing.

## 2021-07-12 NOTE — Assessment & Plan Note (Signed)
Renal function is normal with GFR greater than 90%.  No change in metformin or any other potentially nephrotoxic medications indicated.

## 2021-07-12 NOTE — Assessment & Plan Note (Signed)
03/24/2021 H/H 11.8/34.7.  8/9H/H 10.9/34 indicating slight progression of anemia.  Indices normochromic, normocytic.  No bleeding dyscrasias reported by staff.  Continue to monitor.

## 2021-07-12 NOTE — Assessment & Plan Note (Signed)
Most current A1c 6.7% on 02/21/2021 indicating excellent diabetic control.  A1c update indicated.

## 2021-07-13 NOTE — Patient Instructions (Signed)
See assessment and plan under each diagnosis in the problem list and acutely for this visit 

## 2021-07-14 LAB — HM HEPATITIS C SCREENING LAB: HM Hepatitis Screen: NEGATIVE

## 2021-08-05 ENCOUNTER — Encounter: Payer: Self-pay | Admitting: Adult Health

## 2021-08-05 ENCOUNTER — Non-Acute Institutional Stay (SKILLED_NURSING_FACILITY): Payer: Self-pay | Admitting: Adult Health

## 2021-08-05 DIAGNOSIS — Z8673 Personal history of transient ischemic attack (TIA), and cerebral infarction without residual deficits: Secondary | ICD-10-CM

## 2021-08-05 DIAGNOSIS — R1310 Dysphagia, unspecified: Secondary | ICD-10-CM

## 2021-08-05 NOTE — Progress Notes (Signed)
Location:  [place] Room Number: [place]:  SNF (31) Provider:  Durenda Age, DNP, FNP-BC  Patient Care Team: Hendricks Limes, MD as PCP - General (Internal Medicine) Medina-Vargas, Senaida Lange, NP as Nurse Practitioner (Internal Medicine) Rehab, [place] ([place])  Extended Emergency Contact Information Primary Emergency Contact: cornelia, yeats Mobile Phone: [phone] Relation: Son Secondary Emergency Contact: Delona, Haney Mobile Phone: [phone] Relation: Daughter  Code Status:  DNR  Goals of care: Advanced Directive information Advanced Directives 08/05/2021  Does Patient Have a Medical Advance Directive? No  Type of Advance Directive Out of facility DNR (pink MOST or yellow form)  Does patient want to make changes to medical advance directive? -  Would patient like information on creating a medical advance directive? -  Pre-existing out of facility DNR order (yellow form or pink MOST form) -     Chief Complaint  Patient presents with   Acute Visit    Cough    HPI:  Pt is a 75 y.o. female who was seen today for an acute visit for cough. She is a long-term care resident of [facility]. She has dysphagia due to history of CVA. She has peg tube feeding of Glucerna 1.2 cal at 60 ml/hour. No noted coughing upon assessment. She takes Clopidogrel 75 mg 1 tab daily and Rosuvastatin 5 mg daily for history of CVA.                    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)    Stroke [facility])    Past Surgical History:  Procedure Laterality Date   ABDOMINAL HYSTERECTOMY     APPENDECTOMY     BACK SURGERY     IR CM INJ ANY COLONIC TUBE W/FLUORO  09/27/2020   IR GASTROSTOMY TUBE MOD SED  03/31/2020   IR REPLACE G-TUBE  SIMPLE WO FLUORO  06/21/2021    Allergies  Allergen Reactions   Allopurinol Shortness Of Breath   Tetracycline Anaphylaxis    She was placed on Doxycycline for truncal cellulitis in January 2022 w/o ADE & with improvement in the cellulitis..   Amoxicillin-Pot Clavulanate Diarrhea   Atorvastatin Other (See Comments)    Unknown reaction - reported by [facility], [place] clinic 05/10/2012   Azithromycin Other (See Comments)    Caused blisters inside and out   Codeine Other (See Comments)    Altered mental status   Lisinopril Other (See Comments)    Unknown reaction - reported by [facility], [place] clinic 12/23/2008 02/20/2020 she believes "reaction" was NP cough. No PMH of angioedema   Sulfa Antibiotics Diarrhea    Outpatient Encounter Medications as of 08/05/2021  Medication Sig   Albuterol Sulfate 2.5 MG/0.5ML NEBU Inhale into the lungs every 6 (six) hours as needed.   amLODipine (NORVASC) 10 MG tablet Place 10 mg into feeding tube daily.   barrier cream (NON-SPECIFIED) CREA Apply 1 application topically 2 (two) times daily as needed (for protection.).   chlorhexidine (PERIDEX) 0.12 % solution Use as directed in the mouth or throat 2 (two) times daily.   clopidogrel (PLAVIX) 75 MG tablet Place 1 tablet (75 mg total) into feeding tube daily.   hydrALAZINE (APRESOLINE) 25 MG tablet Place 25 mg into feeding tube every 8 (eight) hours.   insulin  detemir (LEVEMIR) 100 UNIT/ML injection Inject 15 Units into the skin 2 (two) times daily.   metformin (FORTAMET) 500 MG (OSM) 24 hr tablet Take 500 mg by mouth 2 (two) times daily with a meal. VIA G-TUBE FOR DM   metoprolol tartrate (LOPRESSOR) 25 MG tablet Place 25 mg into feeding tube 2 (two) times daily.   Nutritional Supplements (FEEDING SUPPLEMENT, GLUCERNA 1.2 CAL,) LIQD Place into feeding tube every 6 (six) hours. Place at 60 ml/hr via PEG. Water flush to 125 ml Q6h.   pantoprazole sodium (PROTONIX) 40 mg/20 mL PACK Place 20 mLs (40 mg total) into  feeding tube daily.   polyethylene glycol (MIRALAX / GLYCOLAX) 17 g packet Place 17 g into feeding tube daily as needed for mild constipation.   rosuvastatin (CRESTOR) 5 MG tablet Place 1 tablet (5 mg total) into feeding tube daily.   No facility-administered encounter medications on file as of 08/05/2021.    Review of Systems  Unable to obtain due to nonverbal.   Immunization History  Administered Date(s) Administered   Influenza, High Dose Seasonal PF 05/04/2016   Influenza-Unspecified 06/17/2020   PFIZER(Purple Top)SARS-COV-2 Vaccination 10/03/2019, 10/24/2019   Pneumococcal Conjugate-13 09/01/2020   Pneumococcal Polysaccharide-23 08/29/2003, 06/30/2009   Unspecified SARS-COV-2 Vaccination 08/31/2020   Zoster, Live 01/29/2006   Pertinent  Health Maintenance Due  Topic Date Due   FOOT EXAM  Never done   OPHTHALMOLOGY EXAM  Never done   COLONOSCOPY (Pts 45-34yrs Insurance coverage will need to be confirmed)  Never done   DEXA SCAN  Never done   INFLUENZA VACCINE  03/28/2021   HEMOGLOBIN A1C  08/23/2021   Fall Risk 04/24/2020 04/25/2020 04/26/2020 04/26/2020 04/27/2020  Patient Fall Risk Level High fall risk High fall risk High fall risk High fall risk High fall risk     Vitals:   08/05/21 2037  BP: 110/69  Pulse: 66  Resp: 18  Temp: 97.6 F (36.4 C)  Weight: 137 lb 6.4 oz (62.3 kg)  Height: 5\' 1"  (1.549 m)   Body mass index is 25.96 kg/m.  Physical Exam  GENERAL APPEARANCE: Well nourished. In no acute distress.  SKIN:  Skin is warm and dry.  MOUTH and THROAT: Lips are without lesions. Oral mucosa is moist and without lesions.  RESPIRATORY: Breathing is even & unlabored, BS CTAB CARDIAC: RRR, no murmur,no extra heart sounds, no edema GI: Abdomen has peg tube  on LUQ, normal BS,  NEUROLOGICAL: There is no tremor. Nonverbal. Quadriplegic PSYCHIATRIC:  Affect and behavior are appropriate  Labs reviewed: Recent Labs    09/15/20 0000 03/15/21 0000 03/24/21 0000   NA 138 139 143  K 3.8 4.3 4.2  CL 102 102 102  CO2 24* 23* 24*  BUN 26* 31* 32*  CREATININE 0.4* 0.5 0.5  CALCIUM 9.6 10.2 10.0   Recent Labs    09/15/20 0000 03/15/21 0000  AST 10* 10*  ALT 13 8  ALKPHOS 82 94  ALBUMIN 3.3* 3.6   Recent Labs    09/15/20 0000 03/15/21 0000 03/24/21 0000 03/28/21 0000 04/05/21 0000  WBC 11.8   < > 15.3 14.3 12.9  NEUTROABS 8.70  --   --   --   --   HGB 9.9*   < > 11.4* 11.6* 10.9*  HCT 30*   < > 35* 35* 34*  PLT 305   < > 325 337 324   < > = values in this interval not displayed.   Lab Results  Component Value  Date   TSH 2.232 03/08/2020   Lab Results  Component Value Date   HGBA1C 6.7 02/21/2021   Lab Results  Component Value Date   CHOL 131 03/15/2021   HDL 37 03/15/2021   LDLCALC 57 03/15/2021   TRIG 186 (A) 03/15/2021   CHOLHDL 4.6 02/16/2020    Significant Diagnostic Results in last 30 days:  No results found.  Assessment/Plan  1. Dysphagia, unspecified type -  no coughing noted -  continue peg tube feeding with Glucerna  -  aspiration precautions  2. History of CVA (cerebrovascular accident) -  stable, continue Plavix and Rosuvastatin   Family/ staff Communication:   Discussed plan of care with charge nurse.  Labs/tests ordered:  None  Goals of care:   Long-term care   Durenda Age, DNP, MSN, FNP-BC [facility] and Adult Medicine [phone] (Monday-Friday 8:00 a.m. - 5:00 p.m.) [phone] (after hours)

## 2021-08-16 ENCOUNTER — Non-Acute Institutional Stay: Payer: Self-pay | Admitting: Hospice

## 2021-08-16 ENCOUNTER — Other Ambulatory Visit: Payer: Self-pay

## 2021-08-16 DIAGNOSIS — F0390 Unspecified dementia without behavioral disturbance: Secondary | ICD-10-CM

## 2021-08-16 DIAGNOSIS — R5381 Other malaise: Secondary | ICD-10-CM

## 2021-08-16 DIAGNOSIS — Z515 Encounter for palliative care: Secondary | ICD-10-CM

## 2021-08-16 DIAGNOSIS — R1312 Dysphagia, oropharyngeal phase: Secondary | ICD-10-CM

## 2021-08-16 NOTE — Progress Notes (Signed)
Therapist, nutritional Palliative Care Consult Note Telephone: [phone]  Fax: [phone]  PATIENT NAME: Tasha [name] DOB: 1946-02-04 MRN: [id number]  PRIMARY CARE PROVIDER:   Pecola Lawless, MD Pecola Lawless, MD [address] [place],  [place] [address]  REFERRING PROVIDER: Pecola Lawless, MD Pecola Lawless, MD [address] [place],  [place] [address]  RESPONSIBLE PARTY:  Brad Primary Emergency Contact: kamesha, [place]il[place] Mobile Phone: [phone] Relation: Son Secondary Emergency Contact: Angelean, [place] Mobile Phone: [phone] Relation: Daughter Contact Information     Name Relation Home Work [place], [name] Son   [phone]   L[place]ha, [place] Daughter   [phone]       Visit is to build trust and highlight Palliative Medicine as specialized medical care for people living with serious illness, aimed at facilitating better quality of life through symptoms relief, assisting with advance care planning and complex medical decision making. This is a follow up visit.  RECOMMENDATIONS/PLAN:   Advance Care Planning/Code Status: Patient is a DO NOT RESUSCITATE  Goals of Care: Goals of care include to maximize quality of life and symptom management. Family is not ready for hospice service currently.  Palliative care team will continue to support patient, patient's family, and medical team.  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 Bed-bound, non verbal, nothing by mouth, nutrition via peg tube. Total care. Continue passive ROM restorative exercises Dysphagia: PEG tube feeding at 60 mL/h is ongoing.  Aspiration precautions in place. Dementia: Fast 7D. Incontinent of bowel and bladder, minimally interactive. Continue ongoing supportive care. Patient will benefit from hospice service when family is ready for it  Follow up: Palliative care will continue to follow for complex medical decision making, advance care planning,  and clarification of goals. Return 6 weeks or prn. Encouraged to call provider sooner with any concerns.  CHIEF COMPLAINT: Palliative follow up  HISTORY OF PRESENT ILLNESS:  [name] a 75 y.o. 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 History of  hypertension, type 2 diabetes mellitus.  Patient unable to meaningfully interact or discuss with NP, in no acute distress, FLACC 0.  Nursing with no complaints.  History obtained from review of EMR, discussion with primary team, family and/or patient. Records reviewed and summarized above. All 10 point systems reviewed and are negative except as documented in history of present illness above  Review and summarization of Epic records shows history from other than patient.   Palliative Care was asked to follow this patient o help address complex decision making in the context of advance care planning and goals of care clarification.   PHYSICAL EXAM  General: In no acute distress, appropriately dressed Cardiovascular: regular rate and rhythm; no edema in BLE Pulmonary: no cough, no increased work of breathing, normal respiratory effort Abdomen: soft, non tender, no guarding, positive bowel sounds in all quadrants, PEG tube present GU:  no suprapubic tenderness Eyes: Normal lids, no discharge ENMT: Moist mucous membranes Musculoskeletal:  weakness, bedbound, not able to move extremities Skin: no rash to visible skin, warm without cyanosis,  Psych: non-anxious affect Neurological: Weakness but otherwise non focal, nonverbal during visit Heme/lymph/immuno: no bruises, no bleeding  PERTINENT MEDICATIONS:  Outpatient Encounter Medications as of 08/16/2021  Medication Sig   Albuterol Sulfate 2.5 MG/0.5ML NEBU Inhale into the lungs every 6 (six) hours as needed.   amLODipine (NORVASC)  10 MG tablet Place 10 mg into feeding  tube daily.   barrier cream (NON-SPECIFIED) CREA Apply 1 application topically 2 (two) times daily as needed (for protection.).   chlorhexidine (PERIDEX) 0.12 % solution Use as directed in the mouth or throat 2 (two) times daily.   clopidogrel (PLAVIX) 75 MG tablet Place 1 tablet (75 mg total) into feeding tube daily.   hydrALAZINE (APRESOLINE) 25 MG tablet Place 25 mg into feeding tube every 8 (eight) hours.   insulin detemir (LEVEMIR) 100 UNIT/ML injection Inject 15 Units into the skin 2 (two) times daily.   metformin (FORTAMET) 500 MG (OSM) 24 hr tablet Take 500 mg by mouth 2 (two) times daily with a meal. VIA G-TUBE FOR DM   metoprolol tartrate (LOPRESSOR) 25 MG tablet Place 25 mg into feeding tube 2 (two) times daily.   Nutritional Supplements (FEEDING SUPPLEMENT, GLUCERNA 1.2 CAL,) LIQD Place into feeding tube every 6 (six) hours. Place at 60 ml/hr via PEG. Water flush to 125 ml Q6h.   pantoprazole sodium (PROTONIX) 40 mg/20 mL PACK Place 20 mLs (40 mg total) into feeding tube daily.   polyethylene glycol (MIRALAX / GLYCOLAX) 17 g packet Place 17 g into feeding tube daily as needed for mild constipation.   rosuvastatin (CRESTOR) 5 MG tablet Place 1 tablet (5 mg total) into feeding tube daily.   No facility-administered encounter medications on file as of 08/16/2021.    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   Amoxicillin-Pot Clavulanate Diarrhea   Atorvastatin Other (See Comments)    Unknown reaction - reported by  [place] [place] [facility] 05/10/2012   Azithromycin Other (See Comments)    Caused blisters inside and out   Codeine Other (See Comments)    Altered mental status   Lisinopril Other (See Comments)    Unknown reaction - reported by [facility] [place] [facility] 12/23/2008 02/20/2020 she believes "reaction" was NP cough. No PMH of angioedema   Sulfa Antibiotics Diarrhea      I spent 35 minutes providing this consultation; this includes time spent with patient/family, chart review and documentation. More than 50% of the time in this consultation was spent on counseling and coordinating communication   Thank you for the opportunity to participate in the care of Starr Urias Please call our office at [phone] if we can be of additional assistance.  Note: Portions of this note were generated with Scientist, clinical (histocompatibility and immunogenetics). Dictation errors may occur despite best attempts at proofreading.  Rosaura Carpenter, NP

## 2021-09-02 ENCOUNTER — Encounter: Payer: Self-pay | Admitting: Adult Health

## 2021-09-02 ENCOUNTER — Non-Acute Institutional Stay (SKILLED_NURSING_FACILITY): Payer: Self-pay | Admitting: Adult Health

## 2021-09-02 DIAGNOSIS — E1151 Type 2 diabetes mellitus with diabetic peripheral angiopathy without gangrene: Secondary | ICD-10-CM

## 2021-09-02 DIAGNOSIS — R1312 Dysphagia, oropharyngeal phase: Secondary | ICD-10-CM

## 2021-09-02 DIAGNOSIS — I10 Essential (primary) hypertension: Secondary | ICD-10-CM

## 2021-09-02 DIAGNOSIS — Z8673 Personal history of transient ischemic attack (TIA), and cerebral infarction without residual deficits: Secondary | ICD-10-CM

## 2021-09-02 NOTE — Progress Notes (Addendum)
Location:  [facility] Room Number: 223-A Place of Service:  SNF (31) Provider:  Kenard Gower, DNP, FNP-BC  Patient Care Team: Pecola Lawless, MD as PCP - General (Internal Medicine) Medina-Vargas, Margit Banda, NP as Nurse Practitioner (Internal Medicine) Rehab, [facility]  Extended Emergency Contact Information Primary Emergency Contact: chavone, crutcher Mobile Phone: [phone] Relation: Son Secondary Emergency Contact: Ullanda, Clow[place] Mobile Phone: [phone] Relation: Daughter  Code Status:  DNR  Goals of care: Advanced Directive information Advanced Directives 09/02/2021  Does Patient Have a Medical Advance Directive? Yes  Type of Advance Directive Out of facility DNR (pink MOST or yellow form)  Does patient want to make changes to medical advance directive? No - Patient declined  Would patient like information on creating a medical advance directive? -  Pre-existing out of facility DNR order (yellow form or pink MOST form) Yellow form placed in chart (order not valid for inpatient use)     Chief Complaint  Patient presents with   Medical Management of Chronic Issues    Routine Visit    HPI:  Pt is a 76 y.o. female seen today for medical management of chronic diseases. She is a long-term care resident of [facility].  She is being followed by palliative care. She has a PMH of dyslipidemia, MI and essential hypertension.  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  She takes hydralazine 25 mg every 8 hours, amlodipine 10 mg daily and metoprolol tartrate 25 mg twice a day  DM (diabetes mellitus), type 2 with peripheral vascular complications (HCC) -CBGs ranging from 114-170, with outlier high.  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)    Essential (primary) hypertension    History of falling    MI (myocardial infarction) (HCC)    Stroke [facility])    Past Surgical History:  Procedure Laterality Date   ABDOMINAL HYSTERECTOMY     APPENDECTOMY     BACK SURGERY     IR CM INJ ANY COLONIC TUBE W/FLUORO  09/27/2020   IR GASTROSTOMY TUBE MOD SED  03/31/2020   IR REPLACE G-TUBE SIMPLE WO FLUORO  06/21/2021    Allergies  Allergen Reactions   Allopurinol Shortness Of Breath   Tetracycline Anaphylaxis    She was placed on Doxycycline for truncal cellulitis in January 2022 w/o ADE & with improvement in the cellulitis..   Amoxicillin-Pot Clavulanate Diarrhea   Atorvastatin Other (See Comments)    Unknown reaction - reported by [facility] [place] [facility] 05/10/2012   Azithromycin Other (See Comments)    Caused blisters inside and out   Codeine Other (See Comments)    Altered mental status   Lisinopril Other (See Comments)    Unknown reaction - reported by [facility] [place] clinic 12/23/2008 02/20/2020 she believes "reaction" was NP cough. No PMH of angioedema   Sulfa Antibiotics Diarrhea    Outpatient Encounter Medications as of 09/02/2021  Medication Sig   acetaminophen (TYLENOL) 325 MG tablet Take 650 mg by mouth every 6 (six) hours as needed.   Albuterol Sulfate 2.5 MG/0.5ML NEBU Inhale into the lungs every 6 (six) hours as needed.  amLODipine (NORVASC) 10 MG tablet Place 10 mg into feeding tube daily.   barrier cream (NON-SPECIFIED) CREA Apply 1 application topically 2 (two) times daily as needed (for protection.).   bisacodyl (DULCOLAX) 10 MG suppository If not relieved by MOM, give 10 mg Bisacodyl suppositiory rectally X 1 dose in 24 hours as needed   chlorhexidine (PERIDEX) 0.12 % solution Use as directed in the mouth or throat 2 (two) times daily.    clopidogrel (PLAVIX) 75 MG tablet Place 1 tablet (75 mg total) into feeding tube daily.   hydrALAZINE (APRESOLINE) 25 MG tablet Place 25 mg into feeding tube every 8 (eight) hours.   insulin detemir (LEVEMIR) 100 UNIT/ML injection Inject 15 Units into the skin 2 (two) times daily.   Magnesium Hydroxide (MILK OF MAGNESIA PO) If no BM in 3 days, give 30 cc Milk of Magnesium p.o. x 1 dose in 24 hours as needed (Do not use standing constipation orders for residents with renal failure CFR less than 30. Contact MD for orders)   metformin (FORTAMET) 500 MG (OSM) 24 hr tablet Take 500 mg by mouth 2 (two) times daily with a meal. VIA G-TUBE FOR DM   metoprolol tartrate (LOPRESSOR) 25 MG tablet Place 25 mg into feeding tube 2 (two) times daily.   Nutritional Supplements (FEEDING SUPPLEMENT, GLUCERNA 1.2 CAL,) LIQD Place into feeding tube every 6 (six) hours. Place at 60 ml/hr via PEG. Water flush to 125 ml Q6h.   pantoprazole sodium (PROTONIX) 40 mg/20 mL PACK Place 20 mLs (40 mg total) into feeding tube daily.   polyethylene glycol (MIRALAX / GLYCOLAX) 17 g packet Place 17 g into feeding tube daily as needed for mild constipation.   rosuvastatin (CRESTOR) 5 MG tablet Place 1 tablet (5 mg total) into feeding tube daily.   Sodium Phosphates (RA SALINE ENEMA RE) If not relieved by Biscodyl suppository, give disposable Saline Enema rectally X 1 dose/24 hrs as needed   No facility-administered encounter medications on file as of 09/02/2021.    Review of Systems unable to obtain due to nonverbal.    Immunization History  Administered Date(s) Administered   Fluad Quad(high Dose 65+) 06/14/2021   Influenza, High Dose Seasonal PF 05/04/2016   Influenza-Unspecified 06/17/2020   PFIZER(Purple Top)SARS-COV-2 Vaccination 10/03/2019, 10/24/2019   Pneumococcal Conjugate-13 09/01/2020   Pneumococcal Polysaccharide-23 08/29/2003, 06/30/2009   Unspecified SARS-COV-2 Vaccination 08/31/2020   Zoster Recombinat  (Shingrix) 01/26/2006   Zoster, Live 01/29/2006   Pertinent  Health Maintenance Due  Topic Date Due   FOOT EXAM  Never done   OPHTHALMOLOGY EXAM  Never done   COLONOSCOPY (Pts 45-55yrs Insurance coverage will need to be confirmed)  Never done   DEXA SCAN  Never done   HEMOGLOBIN A1C  08/23/2021   INFLUENZA VACCINE  Completed   Fall Risk 04/24/2020 04/25/2020 04/26/2020 04/26/2020 04/27/2020  Patient Fall Risk Level High fall risk High fall risk High fall risk High fall risk High fall risk     Vitals:   09/02/21 1124  BP: 123/74  Pulse: 83  Resp: 19  Temp: 97.6 F (36.4 C)  SpO2: 95%  Weight: 141 lb (64 kg)  Height: 5\' 1"  (1.549 m)   Body mass index is 26.64 kg/m.  Physical Exam Constitutional:      General: She is not in acute distress. HENT:     Head: Normocephalic and atraumatic.     Nose: Nose normal. No congestion.     Mouth/Throat:     Mouth: Mucous  membranes are moist.  Eyes:     Conjunctiva/sclera: Conjunctivae normal.  Cardiovascular:     Rate and Rhythm: Normal rate and regular rhythm.     Pulses: Normal pulses.     Heart sounds: Normal heart sounds.  Pulmonary:     Effort: Pulmonary effort is normal.     Breath sounds: Normal breath sounds.  Abdominal:     Palpations: Abdomen is soft.     Comments: Has peg tube.  Skin:    General: Skin is warm and dry.  Neurological:     Comments: Nonverbal.  Quadriplegic.  Psychiatric:        Mood and Affect: Mood normal.       Labs reviewed: Recent Labs    09/15/20 0000 03/15/21 0000 03/24/21 0000  NA 138 139 143  K 3.8 4.3 4.2  CL 102 102 102  CO2 24* 23* 24*  BUN 26* 31* 32*  CREATININE 0.4* 0.5 0.5  CALCIUM 9.6 10.2 10.0   Recent Labs    09/15/20 0000 03/15/21 0000  AST 10* 10*  ALT 13 8  ALKPHOS 82 94  ALBUMIN 3.3* 3.6   Recent Labs    09/15/20 0000 03/15/21 0000 03/24/21 0000 03/28/21 0000 04/05/21 0000  WBC 11.8   < > 15.3 14.3 12.9  NEUTROABS 8.70  --   --   --   --   HGB 9.9*    < > 11.4* 11.6* 10.9*  HCT 30*   < > 35* 35* 34*  PLT 305   < > 325 337 324   < > = values in this interval not displayed.   Lab Results  Component Value Date   TSH 2.232 03/08/2020   Lab Results  Component Value Date   HGBA1C 6.7 02/21/2021   Lab Results  Component Value Date   CHOL 131 03/15/2021   HDL 37 03/15/2021   LDLCALC 57 03/15/2021   TRIG 186 (A) 03/15/2021   CHOLHDL 4.6 02/16/2020    Significant Diagnostic Results in last 30 days:  No results found.  Assessment/Plan  1. History of CVA (cerebrovascular accident) -   stable, continue Plavix and rosuvastatin -  followed by palliative care  2. Essential (primary) hypertension -Blood pressure well controlled Continue current medications Recheck metabolic panel and cbc  3. DM (diabetes mellitus), type 2 with peripheral vascular complications [facility]) Lab Results  Component Value Date   HGBA1C 6.7 02/21/2021   -  stable, continue metformin and Levemir -   Podiatry and ophthalmology consult  4. Oropharyngeal dysphagia -   Continue PEG tube feeding of Glucerna 1.2 Cal at 60 mL/HR -   Aspiration precautions -   Oral care daily    Family/ staff Communication: Discussed plan of care with charge nurse  Labs/tests ordered:  CBC, CMP with GFR, A1c and lipid panel    Durenda Age, DNP, MSN, FNP-BC [place] [phone] (Monday-Friday 8:00 a.m. - 5:00 p.m.) [phone] (after hours)

## 2021-09-05 LAB — COMPREHENSIVE METABOLIC PANEL
Albumin: 4 (ref 3.5–5.0)
Calcium: 10.1 (ref 8.7–10.7)
GFR calc Af Amer: >90
GFR calc non Af Amer: >90
Globulin: 2.7

## 2021-09-05 LAB — HEMOGLOBIN A1C: Hemoglobin A1C: 6.6

## 2021-09-05 LAB — BASIC METABOLIC PANEL
BUN: 30 — AB (ref 4–21)
CO2: 24 — AB (ref 13–22)
Chloride: 24 — AB (ref 99–108)
Creatinine: 0.4 — AB (ref 0.5–1.1)
Glucose: 168
Potassium: 4.4 (ref 3.4–5.3)
Sodium: 143 (ref 137–147)

## 2021-09-05 LAB — CBC: RBC: 4.72 (ref 3.87–5.11)

## 2021-09-05 LAB — HEPATIC FUNCTION PANEL
ALT: 9 (ref 7–35)
AST: 11 — AB (ref 13–35)
Alkaline Phosphatase: 96 (ref 25–125)
Bilirubin, Total: 0.3

## 2021-09-05 LAB — CBC AND DIFFERENTIAL
HCT: 41 (ref 36–46)
Hemoglobin: 12.9 (ref 12.0–16.0)
Platelets: 281 (ref 150–399)
WBC: 13.9

## 2021-09-14 LAB — CBC AND DIFFERENTIAL
HCT: 38 (ref 36–46)
Hemoglobin: 11.8 — AB (ref 12.0–16.0)
Platelets: 281 (ref 150–399)
WBC: 13.9

## 2021-09-14 LAB — CBC: RBC: 4.33 (ref 3.87–5.11)

## 2021-09-23 ENCOUNTER — Other Ambulatory Visit (HOSPITAL_COMMUNITY): Payer: Self-pay | Admitting: Diagnostic Radiology

## 2021-09-23 DIAGNOSIS — R1319 Other dysphagia: Secondary | ICD-10-CM

## 2021-09-27 ENCOUNTER — Ambulatory Visit (HOSPITAL_COMMUNITY)
Admission: RE | Admit: 2021-09-27 | Discharge: 2021-09-27 | Disposition: A | Discharge status: Home / Self Care | Payer: Medicare Other | Source: Ambulatory Visit | Attending: Diagnostic Radiology | Admitting: Diagnostic Radiology

## 2021-09-27 ENCOUNTER — Other Ambulatory Visit: Payer: Self-pay

## 2021-09-27 ENCOUNTER — Non-Acute Institutional Stay (SKILLED_NURSING_FACILITY): Payer: Self-pay | Admitting: Adult Health

## 2021-09-27 DIAGNOSIS — R1319 Other dysphagia: Secondary | ICD-10-CM | POA: Diagnosis not present

## 2021-09-27 DIAGNOSIS — Z431 Encounter for attention to gastrostomy: Secondary | ICD-10-CM | POA: Insufficient documentation

## 2021-09-27 DIAGNOSIS — E1165 Type 2 diabetes mellitus with hyperglycemia: Secondary | ICD-10-CM

## 2021-09-27 DIAGNOSIS — R1312 Dysphagia, oropharyngeal phase: Secondary | ICD-10-CM

## 2021-09-27 DIAGNOSIS — I10 Essential (primary) hypertension: Secondary | ICD-10-CM

## 2021-09-27 DIAGNOSIS — Z8673 Personal history of transient ischemic attack (TIA), and cerebral infarction without residual deficits: Secondary | ICD-10-CM

## 2021-09-27 HISTORY — PX: IR REPLC GASTRO/COLONIC TUBE PERCUT W/FLUORO: IMG2333

## 2021-09-27 IMAGING — XA IR REPLACE G-TUBE/COLONIC TUBE
2 series · 8 of 8 positions shown · non-contrast
Comparison: none

INDICATION: Dysphagia, stroke patient, indwelling gastrostomy is damaged
externally and leaking

[Series 1: fl angio · 4 of 14 frames shown (1 of 2)]
[frame 3/14]
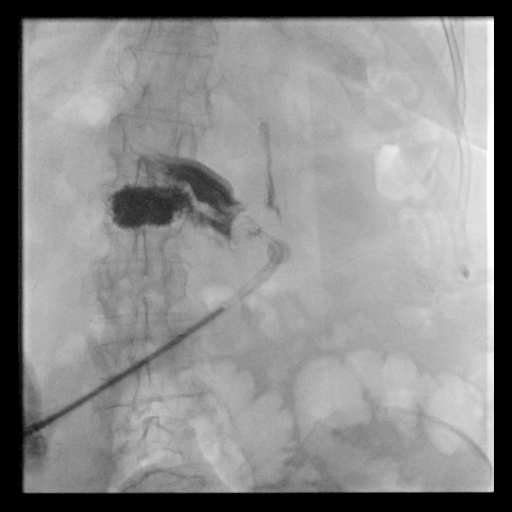
[frame 8/14]
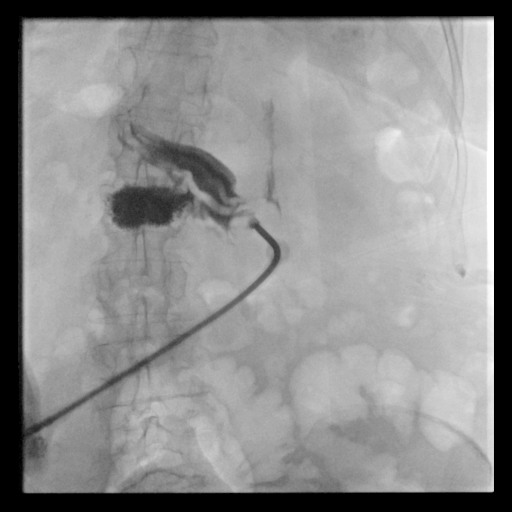
[frame 12/14]
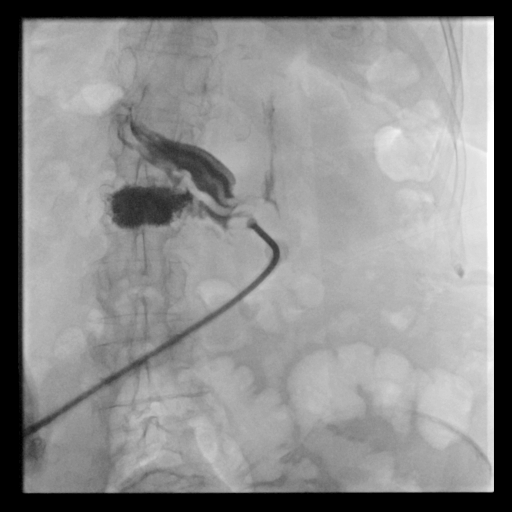
[frame 14/14]
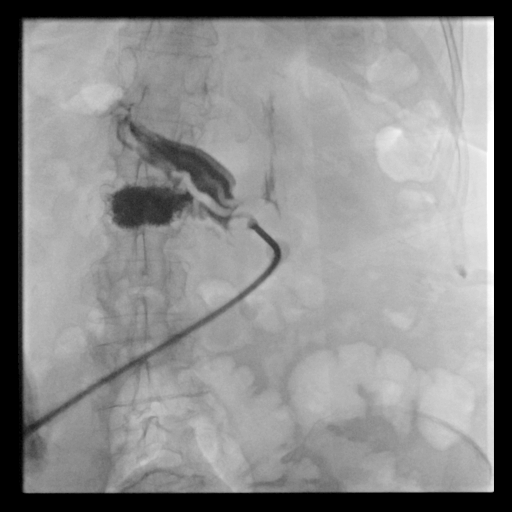

[Series 2: fl angio · 4 of 37 frames shown (2 of 2)]
[frame 6/37]
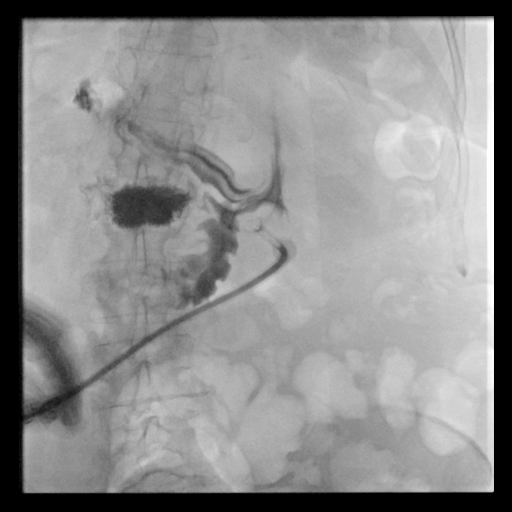
[frame 19/37]
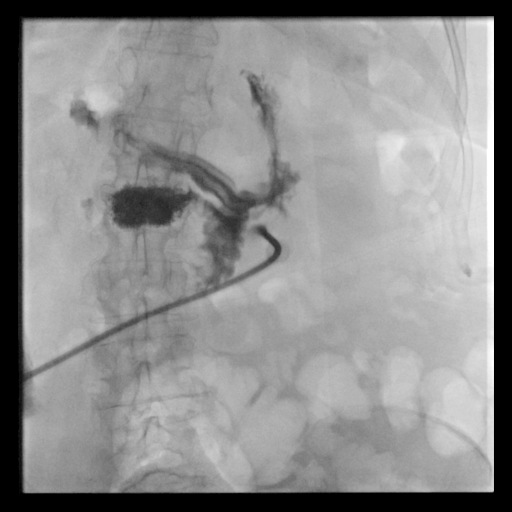
[frame 32/37]
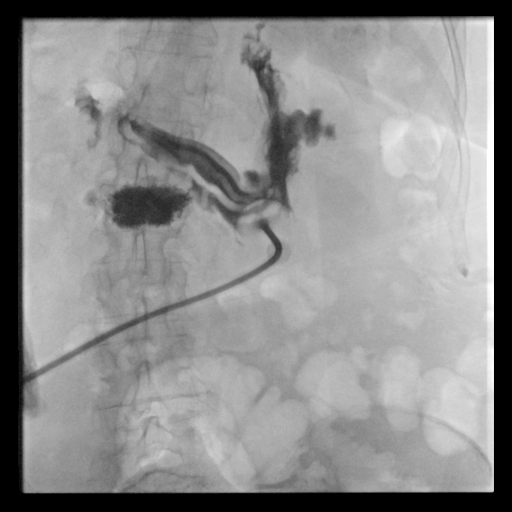
[frame 37/37]
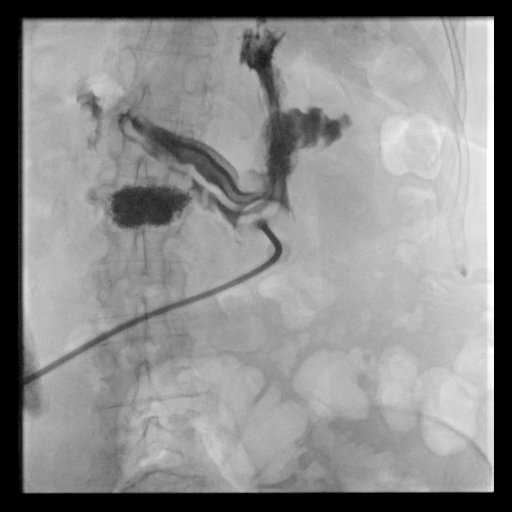

[8 of 8 positions shown; findings below may reference images not displayed]

EXAM:
FLUOROSCOPIC EXCHANGE OF THE 20 FRENCH GASTROSTOMY

MEDICATIONS:
None.

ANESTHESIA/SEDATION:
Total intra-service moderate Sedation Time: None. The patient's
level of consciousness and vital signs were monitored continuously
by radiology nursing throughout the procedure under my direct
supervision.

CONTRAST:  10 cc-administered into the gastric lumen.

FLUOROSCOPY TIME:  Fluoroscopy Time: 0 minutes 18 seconds (2 mGy).

COMPLICATIONS:
None immediate.

PROCEDURE:
Informed written consent was obtained from the patient's family
after a thorough discussion of the procedural risks, benefits and
alternatives. All questions were addressed. Maximal Sterile Barrier
Technique was utilized including caps, mask, sterile gowns, sterile
gloves, sterile drape, hand hygiene and skin antiseptic. A timeout
was performed prior to the initiation of the procedure.

Existing damaged 20 French gastrostomy was exchanged for a 20 French
balloon retention gastrostomy advanced through the percutaneous
tract into the stomach. Retention balloon inflated with 18 cc saline
and retracted against anterior gastric wall. This was secured
externally. Contrast injection confirms position in the stomach.
Images obtained for documentation. No immediate complication.
Patient tolerated procedure well.
IMPRESSION: Successful fluoroscopic exchange of the 20 French balloon retention
gastrostomy. Ready for use.

## 2021-09-27 MED ORDER — IOHEXOL 300 MG/ML  SOLN
100.0000 mL | Freq: Once | INTRAMUSCULAR | Status: AC | PRN
  Administered 2021-09-27: 10 mL

## 2021-09-27 MED ORDER — LIDOCAINE VISCOUS HCL 2 % MT SOLN
OROMUCOSAL | Status: AC
  Filled 2021-09-27: qty 15

## 2021-09-27 NOTE — Progress Notes (Signed)
Location:  [facility] Room Number: [place] of Service:  SNF (31) Provider:  Durenda Age, DNP, FNP-BC  Patient Care Team: Hendricks Limes, MD as PCP - General (Internal Medicine) Medina-Vargas, Senaida Lange, NP as Nurse Practitioner (Internal Medicine) Rehab, [place] ([place])  Extended Emergency Contact Information Primary Emergency Contact: quilla, keicher Mobile Phone: [phone] Relation: Son Secondary Emergency Contact: Alaetra, Tiefenthaler Mobile Phone: [phone] Relation: Daughter  Code Status:  DNR  Goals of care: Advanced Directive information Advanced Directives 09/02/2021  Does Patient Have a Medical Advance Directive? Yes  Type of Advance Directive Out of facility DNR (pink MOST or yellow form)  Does patient want to make changes to medical advance directive? No - Patient declined  Would patient like information on creating a medical advance directive? -  Pre-existing out of facility DNR order (yellow form or pink MOST form) Yellow form placed in chart (order not valid for inpatient use)     Chief Complaint  Patient presents with   Acute Visit    Elevated CBGs    HPI:  Pt is a 76 y.o. female seen today for an acute visit for elevated CBGs. CBGs ranging from 115 to 278, with outlier high. She currently takes Levemir 15 units SQ BID and Metformin 500 mg 1 tab PO BID for type 2 diabetes mellitus. SBPs ranging from 134 to 143, with outlier 106. She takes Metoprolol tartrate 25 mg BID, amlodipine 10 mg 1 tablet daily and hydralazine 25 mg every 8 hours for hypertension.  She has history of CVA and takes Plavix 75 mg daily and rosuvastatin 5 mg daily.  She is quadriplegic and needing total care. She has dysphagia which requires peg tube feeding of Glucerna 1.2 cal at 60 ml/hour.   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)    Essential (primary)  hypertension    History of falling    MI (myocardial infarction) ([place])    Stroke [facility])    Past Surgical History:  Procedure Laterality Date   ABDOMINAL HYSTERECTOMY     APPENDECTOMY     BACK SURGERY     IR CM INJ ANY COLONIC TUBE W/FLUORO  09/27/2020   IR GASTROSTOMY TUBE MOD SED  03/31/2020   IR REPLACE G-TUBE SIMPLE WO FLUORO  06/21/2021   IR REPLC GASTRO/COLONIC TUBE PERCUT W/FLUORO  09/27/2021    Allergies  Allergen Reactions   Allopurinol Shortness Of Breath   Tetracycline Anaphylaxis    She was placed on Doxycycline for truncal cellulitis in January 2022 w/o ADE & with improvement in the cellulitis..   Amoxicillin-Pot Clavulanate Diarrhea   Atorvastatin Other (See Comments)    Unknown reaction - reported by [facility], [place] clinic 05/10/2012   Azithromycin Other (See Comments)    Caused blisters inside and out   Codeine Other (See Comments)    Altered mental status   Lisinopril Other (See Comments)    Unknown reaction - reported by [facility], [place] clinic 12/23/2008 02/20/2020 she believes "reaction" was NP cough. No PMH of angioedema   Sulfa Antibiotics Diarrhea    Outpatient Encounter Medications as of 09/27/2021  Medication Sig   acetaminophen (TYLENOL) 325 MG tablet Take 650 mg by mouth every 6 (six) hours as needed.   Albuterol Sulfate 2.5 MG/0.5ML NEBU Inhale into the lungs every 6 (six) hours as needed.   amLODipine (NORVASC) 10 MG tablet Place 10 mg into feeding tube daily.  barrier cream (NON-SPECIFIED) CREA Apply 1 application topically 2 (two) times daily as needed (for protection.).   bisacodyl (DULCOLAX) 10 MG suppository If not relieved by MOM, give 10 mg Bisacodyl suppositiory rectally X 1 dose in 24 hours as needed   chlorhexidine (PERIDEX) 0.12 % solution Use as directed in the mouth or throat 2 (two) times daily.   clopidogrel (PLAVIX) 75 MG tablet Place 1 tablet (75 mg total) into feeding tube daily.   hydrALAZINE (APRESOLINE) 25 MG tablet Place 25 mg into  feeding tube every 8 (eight) hours.   insulin detemir (LEVEMIR) 100 UNIT/ML injection Inject 15 Units into the skin 2 (two) times daily.   Magnesium Hydroxide (MILK OF MAGNESIA PO) If no BM in 3 days, give 30 cc Milk of Magnesium p.o. x 1 dose in 24 hours as needed (Do not use standing constipation orders for residents with renal failure CFR less than 30. Contact MD for orders)   metformin (FORTAMET) 500 MG (OSM) 24 hr tablet Take 500 mg by mouth 2 (two) times daily with a meal. VIA G-TUBE FOR DM   metoprolol tartrate (LOPRESSOR) 25 MG tablet Place 25 mg into feeding tube 2 (two) times daily.   Nutritional Supplements (FEEDING SUPPLEMENT, GLUCERNA 1.2 CAL,) LIQD Place into feeding tube every 6 (six) hours. Place at 60 ml/hr via PEG. Water flush to 125 ml Q6h.   pantoprazole sodium (PROTONIX) 40 mg/20 mL PACK Place 20 mLs (40 mg total) into feeding tube daily.   polyethylene glycol (MIRALAX / GLYCOLAX) 17 g packet Place 17 g into feeding tube daily as needed for mild constipation.   rosuvastatin (CRESTOR) 5 MG tablet Place 1 tablet (5 mg total) into feeding tube daily.   Sodium Phosphates (RA SALINE ENEMA RE) If not relieved by Biscodyl suppository, give disposable Saline Enema rectally X 1 dose/24 hrs as needed   No facility-administered encounter medications on file as of 09/27/2021.    Review of Systems   unable to obtain due to nonverbal.    Immunization History  Administered Date(s) Administered   Fluad Quad(high Dose 65+) 06/14/2021   Influenza, High Dose Seasonal PF 05/04/2016   Influenza-Unspecified 06/17/2020   PFIZER(Purple Top)SARS-COV-2 Vaccination 10/03/2019, 10/24/2019   Pneumococcal Conjugate-13 09/01/2020   Pneumococcal Polysaccharide-23 08/29/2003, 06/30/2009   Unspecified SARS-COV-2 Vaccination 08/31/2020   Zoster Recombinat (Shingrix) 01/26/2006   Zoster, Live 01/29/2006   Pertinent  Health Maintenance Due  Topic Date Due   FOOT EXAM  Never done   OPHTHALMOLOGY EXAM   Never done   COLONOSCOPY (Pts 45-78yrs Insurance coverage will need to be confirmed)  Never done   DEXA SCAN  Never done   HEMOGLOBIN A1C  08/23/2021   INFLUENZA VACCINE  Completed   Fall Risk 04/24/2020 04/25/2020 04/26/2020 04/26/2020 04/27/2020  Patient Fall Risk Level High fall risk High fall risk High fall risk High fall risk High fall risk     Vitals:   09/27/21 1000  BP: 135/67  Pulse: 74  Resp: 18  Temp: 98.1 F (36.7 C)  Weight: 141 lb (64 kg)  Height: 5\' 1"  (1.549 m)   Body mass index is 26.64 kg/m.  Physical Exam Constitutional:      General: She is not in acute distress. HENT:     Head: Normocephalic and atraumatic.  Eyes:     Conjunctiva/sclera: Conjunctivae normal.  Cardiovascular:     Rate and Rhythm: Normal rate and regular rhythm.  Pulmonary:     Effort: Pulmonary effort is normal.  Breath sounds: Normal breath sounds.  Abdominal:     Palpations: Abdomen is soft.     Comments: Has peg tube intact  Skin:    General: Skin is warm and dry.  Neurological:     Mental Status: Mental status is at baseline.     Comments: Quadriplegic.  Psychiatric:        Mood and Affect: Mood normal.       Labs reviewed: Recent Labs    03/15/21 0000 03/24/21 0000  NA 139 143  K 4.3 4.2  CL 102 102  CO2 23* 24*  BUN 31* 32*  CREATININE 0.5 0.5  CALCIUM 10.2 10.0   Recent Labs    03/15/21 0000  AST 10*  ALT 8  ALKPHOS 94  ALBUMIN 3.6   Recent Labs    03/24/21 0000 03/28/21 0000 04/05/21 0000  WBC 15.3 14.3 12.9  HGB 11.4* 11.6* 10.9*  HCT 35* 35* 34*  PLT 325 337 324   Lab Results  Component Value Date   TSH 2.232 03/08/2020   Lab Results  Component Value Date   HGBA1C 6.7 02/21/2021   Lab Results  Component Value Date   CHOL 131 03/15/2021   HDL 37 03/15/2021   LDLCALC 57 03/15/2021   TRIG 186 (A) 03/15/2021   CHOLHDL 4.6 02/16/2020    Significant Diagnostic Results in last 30 days:  IR Replc Gastro/Colonic Tube Percut  W/Fluoro  Result Date: 09/27/2021 INDICATION: Dysphagia, stroke patient, indwelling gastrostomy is damaged externally and leaking EXAM: FLUOROSCOPIC EXCHANGE OF THE 20 FRENCH GASTROSTOMY MEDICATIONS: None. ANESTHESIA/SEDATION: Total intra-service moderate Sedation Time: None. The patient's level of consciousness and vital signs were monitored continuously by radiology nursing throughout the procedure under my direct supervision. CONTRAST:  10 cc-administered into the gastric lumen. FLUOROSCOPY TIME:  Fluoroscopy Time: 0 minutes 18 seconds (2 mGy). COMPLICATIONS: None immediate. PROCEDURE: Informed written consent was obtained from the patient's family after a thorough discussion of the procedural risks, benefits and alternatives. All questions were addressed. Maximal Sterile Barrier Technique was utilized including caps, mask, sterile gowns, sterile gloves, sterile drape, hand hygiene and skin antiseptic. A timeout was performed prior to the initiation of the procedure. Existing damaged 20 [place] gastrostomy was exchanged for a 20 French balloon retention gastrostomy advanced through the percutaneous tract into the stomach. Retention balloon inflated with 18 cc saline and retracted against anterior gastric wall. This was secured externally. Contrast injection confirms position in the stomach. Images obtained for documentation. No immediate complication. Patient tolerated procedure well. IMPRESSION: Successful fluoroscopic exchange of the 20 French balloon retention gastrostomy. Ready for use. Electronically Signed   By: Judie Petit.  Shick M.D.   On: 09/27/2021 13:20    Assessment/Plan  1. Uncontrolled type 2 diabetes mellitus with hyperglycemia (HCC) Lab Results  Component Value Date   HGBA1C 6.7 02/21/2021   -  increase Levemir to 18 units SQ BID and continue metformin 500 mg twice a day -    Monitor CBGs  2. Essential (primary) hypertension -   BPs stable, continue amlodipine, metoprolol tartrate and  hydralazine  3. History of CVA (cerebrovascular accident) -   Stable, continue Plavix 75 mg daily and rosuvastatin 5 mg daily  4. Oropharyngeal dysphagia -   Continue peg tube feedings of Glucerna 1.2 cal at 60 ml/hour -   aspiration precautions -   oral care daily    Family/ staff Communication: Discussed plan of care with resident and charge nurse  Labs/tests ordered:  None  Durenda Age, DNP, MSN, FNP-BC [facility] [phone] (Monday-Friday 8:00 a.m. - 5:00 p.m.) [phone] (after hours)

## 2021-09-27 NOTE — Procedures (Signed)
Interventional Radiology Procedure Note  Procedure: FLUORO 20 FR GTUBE EXCHG    Complications: None  Estimated Blood Loss:  0  Findings: CONFIRMED IN THE STOMACH    M. Ruel Favors, MD

## 2021-09-29 ENCOUNTER — Encounter: Payer: Self-pay | Admitting: Adult Health

## 2021-09-29 ENCOUNTER — Non-Acute Institutional Stay (SKILLED_NURSING_FACILITY): Payer: Self-pay | Admitting: Adult Health

## 2021-09-29 ENCOUNTER — Other Ambulatory Visit: Payer: Self-pay | Admitting: Adult Health

## 2021-09-29 DIAGNOSIS — N390 Urinary tract infection, site not specified: Secondary | ICD-10-CM

## 2021-09-29 DIAGNOSIS — Z23 Encounter for immunization: Secondary | ICD-10-CM

## 2021-09-29 MED ORDER — SACCHAROMYCES BOULARDII 250 MG PO CAPS: 250.0000 mg | ORAL_CAPSULE | Freq: Two times a day (BID) | ORAL | 0 refills | Status: DC

## 2021-09-29 MED ORDER — CIPROFLOXACIN HCL 500 MG PO TABS: 500.0000 mg | ORAL_TABLET | Freq: Two times a day (BID) | ORAL | 0 refills | Status: AC

## 2021-09-29 NOTE — Progress Notes (Addendum)
Location:  [place] Room Number: 226-A Place of Service:  SNF (31) Provider:  Durenda Age, DNP, FNP-BC  Patient Care Team: Hendricks Limes, MD as PCP - General (Internal Medicine) Medina-Vargas, Senaida Lange, NP as Nurse Practitioner (Internal Medicine) Rehab, [place] ([place])  Extended Emergency Contact Information Primary Emergency Contact: tosca, brandis Mobile Phone: [phone] Relation: Son Secondary Emergency Contact: Brenae, Lafuente Mobile Phone: [phone] Relation: Daughter  Code Status:  DNR  Goals of care: Advanced Directive information Advanced Directives 09/29/2021  Does Patient Have a Medical Advance Directive? Yes  Type of Advance Directive Out of facility DNR (pink MOST or yellow form)  Does patient want to make changes to medical advance directive? No - Patient declined  Would patient like information on creating a medical advance directive? -  Pre-existing out of facility DNR order (yellow form or pink MOST form) Yellow form placed in chart (order not valid for inpatient use)     Chief Complaint  Patient presents with   Acute Visit    Urinary tract infection (UTI)    HPI:  Pt is a 76 y.o. female seen today for an acute visit regarding abnormal urine culture. She was noted to have wbc 13.9, hgb 11.8. No reported fever. Urine culture showed >100,000 CFU/ml with lactose fermenting gram-negative rods, E. coli.  No hematuria noted. She has history of CVA, dyslipidemia, MI and essential hypertension.  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)    Essential (primary) hypertension    History of falling    MI (myocardial infarction) ([place])    Stroke [facility])    Past Surgical History:  Procedure Laterality Date   ABDOMINAL HYSTERECTOMY     APPENDECTOMY     BACK SURGERY     IR CM INJ ANY COLONIC TUBE W/FLUORO  09/27/2020   IR  GASTROSTOMY TUBE MOD SED  03/31/2020   IR REPLACE G-TUBE SIMPLE WO FLUORO  06/21/2021   IR REPLC GASTRO/COLONIC TUBE PERCUT W/FLUORO  09/27/2021    Allergies  Allergen Reactions   Allopurinol Shortness Of Breath   Tetracycline Anaphylaxis    She was placed on Doxycycline for truncal cellulitis in January 2022 w/o ADE & with improvement in the cellulitis..   Amoxicillin-Pot Clavulanate Diarrhea   Atorvastatin Other (See Comments)    Unknown reaction - reported by [facility], [place] clinic 05/10/2012   Azithromycin Other (See Comments)    Caused blisters inside and out   Codeine Other (See Comments)    Altered mental status   Lisinopril Other (See Comments)    Unknown reaction - reported by [facility], [place] clinic 12/23/2008 02/20/2020 she believes "reaction" was NP cough. No PMH of angioedema   Sulfa Antibiotics Diarrhea    Outpatient Encounter Medications as of 09/29/2021  Medication Sig   acetaminophen (TYLENOL) 325 MG tablet Take 650 mg by mouth every 6 (six) hours as needed.   Albuterol Sulfate 2.5 MG/0.5ML NEBU Inhale into the lungs every 6 (six) hours as needed.   amLODipine (NORVASC) 10 MG tablet Place 10 mg into feeding tube daily.   barrier cream (NON-SPECIFIED) CREA Apply 1 application topically 2 (two) times daily as needed (for protection.).   bisacodyl (DULCOLAX) 10 MG suppository If not relieved by MOM, give 10 mg Bisacodyl suppositiory rectally X 1 dose in 24 hours as needed   chlorhexidine (PERIDEX) 0.12 % solution Use as directed in the mouth or throat 2 (two) times daily.  ciprofloxacin (CIPRO) 500 MG tablet Place 1 tablet (500 mg total) into feeding tube 2 (two) times daily for 7 days.   clopidogrel (PLAVIX) 75 MG tablet Place 1 tablet (75 mg total) into feeding tube daily.   hydrALAZINE (APRESOLINE) 25 MG tablet Place 25 mg into feeding tube every 8 (eight) hours.   insulin detemir (LEVEMIR) 100 UNIT/ML injection Inject 18 Units into the skin 2 (two) times  daily.   Magnesium Hydroxide (MILK OF MAGNESIA PO) If no BM in 3 days, give 30 cc Milk of Magnesium p.o. x 1 dose in 24 hours as needed (Do not use standing constipation orders for residents with renal failure CFR less than 30. Contact MD for orders)   metformin (FORTAMET) 500 MG (OSM) 24 hr tablet Take 500 mg by mouth 2 (two) times daily with a meal. VIA G-TUBE FOR DM   metoprolol tartrate (LOPRESSOR) 25 MG tablet Place 25 mg into feeding tube 2 (two) times daily.   Nutritional Supplements (FEEDING SUPPLEMENT, GLUCERNA 1.2 CAL,) LIQD Place into feeding tube every 6 (six) hours. Place at 60 ml/hr via PEG. Water flush to 125 ml Q6h.   pantoprazole sodium (PROTONIX) 40 mg/20 mL PACK Place 20 mLs (40 mg total) into feeding tube daily.   polyethylene glycol (MIRALAX / GLYCOLAX) 17 g packet Place 17 g into feeding tube daily as needed for mild constipation.   rosuvastatin (CRESTOR) 5 MG tablet Place 1 tablet (5 mg total) into feeding tube daily.   Sodium Phosphates (RA SALINE ENEMA RE) If not relieved by Biscodyl suppository, give disposable Saline Enema rectally X 1 dose/24 hrs as needed   [DISCONTINUED] saccharomyces boulardii (FLORASTOR) 250 MG capsule Take 1 capsule (250 mg total) by mouth 2 (two) times daily for 10 days.   No facility-administered encounter medications on file as of 09/29/2021.    Review of Systems  Unable to obtain due to nonverbal.   Immunization History  Administered Date(s) Administered   Fluad Quad(high Dose 65+) 06/14/2021   Influenza, High Dose Seasonal PF 05/04/2016   Influenza-Unspecified 06/17/2020   PFIZER(Purple Top)SARS-COV-2 Vaccination 10/03/2019, 10/24/2019   Pneumococcal Conjugate-13 09/01/2020   Pneumococcal Polysaccharide-23 08/29/2003, 06/30/2009   Unspecified SARS-COV-2 Vaccination 08/31/2020   Zoster Recombinat (Shingrix) 01/26/2006   Zoster, Live 01/29/2006   Zoster, Unspecified 01/26/2006   Pertinent  Health Maintenance Due   Topic Date Due   FOOT EXAM  Never done   OPHTHALMOLOGY EXAM  Never done   COLONOSCOPY (Pts 45-110yrs Insurance coverage will need to be confirmed)  Never done   HEMOGLOBIN A1C  03/05/2022   INFLUENZA VACCINE  Completed   DEXA SCAN  Discontinued   Fall Risk 04/24/2020 04/25/2020 04/26/2020 04/26/2020 04/27/2020  Patient Fall Risk Level High fall risk High fall risk High fall risk High fall risk High fall risk     Vitals:   09/29/21 1446  BP: 135/73  Pulse: 74  Resp: 17  Temp: (!) 97.1 F (36.2 C)  SpO2: 95%  Weight: 141 lb (64 kg)  Height: 5\' 1"  (1.549 m)   Body mass index is 26.64 kg/m.  Physical Exam Constitutional:      General: She is not in acute distress. HENT:     Head: Normocephalic and atraumatic.     Nose: Nose normal.     Mouth/Throat:     Mouth: Mucous membranes are moist.  Eyes:     Conjunctiva/sclera: Conjunctivae normal.  Cardiovascular:     Rate and Rhythm: Normal rate and regular rhythm.  Pulmonary:     Effort: Pulmonary effort is normal.     Breath sounds: Normal breath sounds.  Abdominal:     General: Bowel sounds are normal.     Palpations: Abdomen is soft.     Comments: Has peg tube  Musculoskeletal:        General: No swelling.  Skin:    General: Skin is warm and dry.  Neurological:     Mental Status: She is alert. Mental status is at baseline.     Comments: Aphasic, quadriplegic.  Psychiatric:        Mood and Affect: Mood normal.       Labs reviewed: Recent Labs    03/15/21 0000 03/24/21 0000 09/05/21 0000  NA 139 143 143  K 4.3 4.2 4.4  CL 102 102 24*  CO2 23* 24* 24*  BUN 31* 32* 30*  CREATININE 0.5 0.5 0.4*  CALCIUM 10.2 10.0 10.1   Recent Labs    03/15/21 0000 09/05/21 0000  AST 10* 11*  ALT 8 9  ALKPHOS 94 96  ALBUMIN 3.6 4.0   Recent Labs    04/05/21 0000 09/05/21 0000 09/14/21 0000  WBC 12.9 13.9 13.9  HGB 10.9* 12.9 11.8*  HCT 34* 41 38  PLT 324 281 281   Lab Results  Component Value Date    TSH 2.232 03/08/2020   Lab Results  Component Value Date   HGBA1C 6.6 09/05/2021   Lab Results  Component Value Date   CHOL 131 03/15/2021   HDL 37 03/15/2021   LDLCALC 57 03/15/2021   TRIG 186 (A) 03/15/2021   CHOLHDL 4.6 02/16/2020    Significant Diagnostic Results in last 30 days:  IR Replc Gastro/Colonic Tube Percut W/Fluoro  Result Date: 09/27/2021 INDICATION: Dysphagia, stroke patient, indwelling gastrostomy is damaged externally and leaking EXAM: FLUOROSCOPIC EXCHANGE OF THE 20 FRENCH GASTROSTOMY MEDICATIONS: None. ANESTHESIA/SEDATION: Total intra-service moderate Sedation Time: None. The patient's level of consciousness and vital signs were monitored continuously by radiology nursing throughout the procedure under my direct supervision. CONTRAST:  10 cc-administered into the gastric lumen. FLUOROSCOPY TIME:  Fluoroscopy Time: 0 minutes 18 seconds (2 mGy). COMPLICATIONS: None immediate. PROCEDURE: Informed written consent was obtained from the patient's family after a thorough discussion of the procedural risks, benefits and alternatives. All questions were addressed. Maximal Sterile Barrier Technique was utilized including caps, mask, sterile gowns, sterile gloves, sterile drape, hand hygiene and skin antiseptic. A timeout was performed prior to the initiation of the procedure. Existing damaged 13 [place] gastrostomy was exchanged for a 61 French balloon retention gastrostomy advanced through the percutaneous tract into the stomach. Retention balloon inflated with 18 cc saline and retracted against anterior gastric wall. This was secured externally. Contrast injection confirms position in the stomach. Images obtained for documentation. No immediate complication. Patient tolerated procedure well. IMPRESSION: Successful fluoroscopic exchange of the 20 French balloon retention gastrostomy. Ready for use. Electronically Signed   By: Jerilynn Mages.  Shick M.D.   On: 09/27/2021 13:20     Assessment/Plan  1. Urinary tract infection without hematuria, site unspecified -  will start on Cipro 500 mg BID and and Florastor 250 mg BID X 10 days  2.  Need for Tdap vaccination -   Tdap vaccine 0.5 mL IM x1  3.  Need for shingles vaccine -    Shingrix vaccine 5 mL IM x1  Family/ staff Communication: Discussed plan of care with charge nurse.  Labs/tests ordered:   None    Jannae Fagerstrom  Medina-Vargas, DNP, MSN, FNP-BC [facility] [phone] (Monday-Friday 8:00 a.m. - 5:00 p.m.) [phone] (after hours)

## 2021-10-03 LAB — CBC AND DIFFERENTIAL
HCT: 38 (ref 36–46)
Hemoglobin: 12.4 (ref 12.0–16.0)
Neutrophils Absolute: 8.9
Platelets: 290 (ref 150–399)
WBC: 12.5

## 2021-10-03 LAB — CBC: RBC: 4.33 (ref 3.87–5.11)

## 2021-10-04 LAB — CBC: RBC: 4.55 (ref 3.87–5.11)

## 2021-10-04 LAB — CBC AND DIFFERENTIAL
HCT: 41 (ref 36–46)
Hemoglobin: 12.5 (ref 12.0–16.0)
Platelets: 322 (ref 150–399)
WBC: 12.8

## 2021-10-11 ENCOUNTER — Non-Acute Institutional Stay (SKILLED_NURSING_FACILITY): Payer: Medicare Other | Admitting: Internal Medicine

## 2021-10-11 ENCOUNTER — Encounter: Payer: Self-pay | Admitting: Internal Medicine

## 2021-10-11 DIAGNOSIS — E1151 Type 2 diabetes mellitus with diabetic peripheral angiopathy without gangrene: Secondary | ICD-10-CM | POA: Diagnosis not present

## 2021-10-11 DIAGNOSIS — E46 Unspecified protein-calorie malnutrition: Secondary | ICD-10-CM

## 2021-10-11 DIAGNOSIS — I6930 Unspecified sequelae of cerebral infarction: Secondary | ICD-10-CM | POA: Diagnosis not present

## 2021-10-11 DIAGNOSIS — Z931 Gastrostomy status: Secondary | ICD-10-CM | POA: Diagnosis not present

## 2021-10-11 NOTE — Assessment & Plan Note (Signed)
Glucerna 1.2 at a rate of 60 mL per 24 hours to yield 1440 mL and 1728 kcal per 24 hours.

## 2021-10-11 NOTE — Assessment & Plan Note (Signed)
Diabetic control is excellent as documented by current A1c of 6.8%.  As documented she is receiving Glucerna via PEG tube.

## 2021-10-11 NOTE — Assessment & Plan Note (Signed)
She remains unresponsive and noncommunicative.  She moved none of her extremities and follows no commands. Clinically she exhibited intermittent hypopnea and intermittent apnea without snoring.

## 2021-10-11 NOTE — Progress Notes (Signed)
° °  NURSING HOME LOCATION:  [facility] ROOM NUMBER:  [id number]  CODE STATUS:  DNR  PCP:  Durenda Age NP,PSC  This is a nursing facility follow up visit for of chronic medical diagnoses & to document compliance with Regulation 483.30 (c) in [employer] [facility] Manual Phase 2 which mandates caregiver visit ( visits can alternate among physician, PA or NP as per statutes) within 10 days of 30 days / 60 days/ 90 days post admission to SNF date    Interim medical record and care since last SNF visit was updated with review of diagnostic studies and change in clinical status since last visit were documented.  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  Most recent labs were completed in January 2023 creatinine was 0.4 with a GFR greater than 90 indicating normal renal function.  Mild anemia was present with H/H of 11.8/38 down from 12.9/41.  Diabetic control was excellent as documented by an A1c of 6.8%.  LDL was at therapeutic goal with a value of 57 on 03/15/2021.  Total protein was within normal limits with a value of 6.8 when last checked 03/22/2020.  Patient is receiving tube feedings with Glucerna.  Review of systems could not be completed due to altered mental status and unresponsive state.  Physical exam:  Pertinent or positive findings: She is unable to communicate & remains asleep throughout the exam.  Hair is disheveled.  Her brows are prominent and eyelids puffy.  Eyebrows are decreased laterally.  Mouth is agape and sags to the left.  Teeth and tongue are coated with dried dark oral secretions.  She exhibits hypopnea and intermittent apnea without significant snoring.  First heart sound is accentuated.  She has low grade expiratory rhonchi bilaterally.  Breath sounds are decreased at the  bases.  Abdomen is protuberant.  PEG tube is present.  Pedal pulses are decreased.  Hands are in braces.  Knees are fused formally enlarged.  Calves are atrophic.  Toenails are short and deformed.  The left great toenail appears to be absent.  There is no spontaneous movement of any extremities.  General appearance: no acute distress, increased work of breathing is present.   Lymphatic: No lymphadenopathy about the head, neck, axilla. Eyes: No conjunctival inflammation or lid edema is present. There is no scleral icterus. Ears:  External ear exam shows no significant lesions or deformities.   Nose:  External nasal examination shows no deformity or inflammation. Nasal mucosa are pink and moist without lesions, exudates Neck:  No thyromegaly, masses, tenderness noted.    Heart:  Normal rate and regular rhythm. S2 normal without gallop, murmur, click, rub .  Lungs:  without wheezes, rales, rubs. Abdomen: Bowel sounds are normal. Abdomen is soft and nontender with no organomegaly, hernias, masses. GU: Deferred  Extremities:  No cyanosis, clubbing, edema  Neurologic exam :Balance, Rhomberg, finger to nose testing could not be completed due to clinical state Skin: Warm & dry w/o tenting. No significant lesions or rash.  See summary under each active problem in the Problem List with associated updated therapeutic plan

## 2021-10-11 NOTE — Assessment & Plan Note (Signed)
Most recent total protein was normal with a value of 6.8 when checked 03/22/2020.  She remains on Glucerna tube feedings.

## 2021-10-11 NOTE — Patient Instructions (Signed)
See assessment and plan under each diagnosis in the problem list and acutely for this visit 

## 2021-10-13 ENCOUNTER — Encounter: Payer: Self-pay | Admitting: Adult Health

## 2021-10-13 ENCOUNTER — Non-Acute Institutional Stay (SKILLED_NURSING_FACILITY): Payer: Medicare Other | Admitting: Adult Health

## 2021-10-13 DIAGNOSIS — B370 Candidal stomatitis: Secondary | ICD-10-CM | POA: Diagnosis not present

## 2021-10-13 DIAGNOSIS — N390 Urinary tract infection, site not specified: Secondary | ICD-10-CM

## 2021-10-13 NOTE — Progress Notes (Signed)
Location:  [facility] Room Number: 226-A Place of Service:  SNF (31) Provider:  Durenda Age, DNP, FNP-BC  Patient Care Team: Hendricks Limes, MD as PCP - General (Internal Medicine) Medina-Vargas, Senaida Lange, NP as Nurse Practitioner (Internal Medicine) Rehab, [place] ([place])  Extended Emergency Contact Information Primary Emergency Contact: mignonne, [place] Mobile Phone: [phone] Relation: Son Secondary Emergency Contact: Naima, [place] Mobile Phone: [phone] Relation: Daughter  Code Status:  DNR  Goals of care: Advanced Directive information Advanced Directives 10/13/2021  Does Patient Have a Medical Advance Directive? Yes  Type of Advance Directive Out of facility DNR (pink MOST or yellow form)  Does patient want to make changes to medical advance directive? No - Guardian declined  Would patient like information on creating a medical advance directive? -  Pre-existing out of facility DNR order (yellow form or pink MOST form) Yellow form placed in chart (order not valid for inpatient use)     Chief Complaint  Patient presents with   Acute Visit    Oral yeast     HPI:  Pt is a 76 y.o. female seen today for an acute care visit. She was noted to have brownish thick particles in her mouth. She is currently on antibiotic, Cipro, for UTI. She has dysphagia due to history of CVA and currently on tube feedings. She is a long-term care resident of [facility].   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)    Essential (primary) hypertension    History of falling    MI (myocardial infarction) ([place])    Stroke [facility])    Past Surgical History:  Procedure Laterality Date   ABDOMINAL HYSTERECTOMY     APPENDECTOMY     BACK SURGERY     IR CM INJ ANY COLONIC TUBE W/FLUORO  09/27/2020   IR GASTROSTOMY TUBE MOD SED  03/31/2020   IR  REPLACE G-TUBE SIMPLE WO FLUORO  06/21/2021   IR REPLC GASTRO/COLONIC TUBE PERCUT W/FLUORO  09/27/2021    Allergies  Allergen Reactions   Allopurinol Shortness Of Breath   Tetracycline Anaphylaxis    She was placed on Doxycycline for truncal cellulitis in January 2022 w/o ADE & with improvement in the cellulitis..   Amoxicillin-Pot Clavulanate Diarrhea   Atorvastatin Other (See Comments)    Unknown reaction - reported by [facility], [place] clinic 05/10/2012   Azithromycin Other (See Comments)    Caused blisters inside and out   Codeine Other (See Comments)    Altered mental status   Lisinopril Other (See Comments)    Unknown reaction - reported by [facility], [place] clinic 12/23/2008 02/20/2020 she believes "reaction" was NP cough. No PMH of angioedema   Sulfa Antibiotics Diarrhea    Outpatient Encounter Medications as of 10/13/2021  Medication Sig   acetaminophen (TYLENOL) 325 MG tablet Take 650 mg by mouth every 6 (six) hours as needed.   Albuterol Sulfate 2.5 MG/0.5ML NEBU Inhale into the lungs every 6 (six) hours as needed.   amLODipine (NORVASC) 10 MG tablet Place 10 mg into feeding tube daily.   barrier cream (NON-SPECIFIED) CREA Apply 1 application topically 2 (two) times daily as needed (for protection.).   bisacodyl (DULCOLAX) 10 MG suppository If not relieved by MOM, give 10 mg Bisacodyl suppositiory rectally X 1 dose in 24 hours as needed   chlorhexidine (PERIDEX) 0.12 % solution Use as directed in the mouth or throat 2 (two)  times daily.   ciprofloxacin (CIPRO) 500 MG tablet Give one tablet via peg tube bid till 10/13/21   clopidogrel (PLAVIX) 75 MG tablet Place 1 tablet (75 mg total) into feeding tube daily.   hydrALAZINE (APRESOLINE) 25 MG tablet Place 25 mg into feeding tube every 8 (eight) hours.   insulin detemir (LEVEMIR) 100 UNIT/ML injection Inject 18 Units into the skin 2 (two) times daily.   Magnesium Hydroxide (MILK OF MAGNESIA PO) If no BM in 3 days, give 30 cc Milk of  Magnesium p.o. x 1 dose in 24 hours as needed (Do not use standing constipation orders for residents with renal failure CFR less than 30. Contact MD for orders)   metformin (FORTAMET) 500 MG (OSM) 24 hr tablet Take 500 mg by mouth 2 (two) times daily with a meal. VIA G-TUBE FOR DM   metoprolol tartrate (LOPRESSOR) 25 MG tablet Place 25 mg into feeding tube 2 (two) times daily.   Nutritional Supplements (FEEDING SUPPLEMENT, GLUCERNA 1.2 CAL,) LIQD Place into feeding tube every 6 (six) hours. Place at 60 ml/hr via PEG. Water flush to 125 ml Q6h.   pantoprazole sodium (PROTONIX) 40 mg/20 mL PACK Place 20 mLs (40 mg total) into feeding tube daily.   polyethylene glycol (MIRALAX / GLYCOLAX) 17 g packet Place 17 g into feeding tube daily as needed for mild constipation.   rosuvastatin (CRESTOR) 5 MG tablet Place 1 tablet (5 mg total) into feeding tube daily.   saccharomyces boulardii (FLORASTOR) 250 MG capsule Take 250 mg by mouth 2 (two) times daily. peg tube bid till 10/19/21   Sodium Phosphates (RA SALINE ENEMA RE) If not relieved by Biscodyl suppository, give disposable Saline Enema rectally X 1 dose/24 hrs as needed   urea (CARMOL) 20 % cream Apply topically. TO BE APPLIED BOTH FEET DAILY   No facility-administered encounter medications on file as of 10/13/2021.    Review of Systems  Unable to obtain due to non-verbal.    Immunization History  Administered Date(s) Administered   Fluad Quad(high Dose 65+) 06/14/2021   Influenza, High Dose Seasonal PF 05/04/2016   Influenza-Unspecified 06/17/2020   PFIZER(Purple Top)SARS-COV-2 Vaccination 10/03/2019, 10/24/2019   Pneumococcal Conjugate-13 09/01/2020   Pneumococcal Polysaccharide-23 08/29/2003, 06/30/2009   Unspecified SARS-COV-2 Vaccination 08/31/2020   Zoster Recombinat (Shingrix) 01/26/2006   Zoster, Live 01/29/2006   Zoster, Unspecified 01/26/2006   Pertinent  Health Maintenance Due  Topic Date Due   FOOT EXAM  Never done    OPHTHALMOLOGY EXAM  Never done   COLONOSCOPY (Pts 45-60yrs Insurance coverage will need to be confirmed)  Never done   HEMOGLOBIN A1C  03/05/2022   INFLUENZA VACCINE  Completed   DEXA SCAN  Discontinued   Fall Risk 04/24/2020 04/25/2020 04/26/2020 04/26/2020 04/27/2020  Patient Fall Risk Level High fall risk High fall risk High fall risk High fall risk High fall risk     Vitals:   10/13/21 1540  BP: (!) 141/81  Pulse: 90  Resp: 19  Temp: (!) 97.5 F (36.4 C)  SpO2: 95%  Weight: 140 lb 9.6 oz (63.8 kg)  Height: 5\' 1"  (1.549 m)   Body mass index is 26.57 kg/m.  Physical Exam Constitutional:      General: She is not in acute distress.    Appearance: Normal appearance.  HENT:     Head: Normocephalic and atraumatic.     Nose: Nose normal.     Mouth/Throat:     Mouth: Mucous membranes are moist.  Comments: Has thick brownish particles in her mouth. Eyes:     Conjunctiva/sclera: Conjunctivae normal.  Cardiovascular:     Rate and Rhythm: Normal rate and regular rhythm.  Pulmonary:     Effort: Pulmonary effort is normal.     Breath sounds: Normal breath sounds.  Abdominal:     General: Bowel sounds are normal.     Palpations: Abdomen is soft.     Comments: Has pegtube  Skin:    General: Skin is warm and dry.  Neurological:     Mental Status: She is alert. Mental status is at baseline.     Comments: Quadriplegic, nonverbal.  Psychiatric:        Mood and Affect: Mood normal.        Behavior: Behavior normal.       Labs reviewed: Recent Labs    03/15/21 0000 03/24/21 0000 09/05/21 0000  NA 139 143 143  K 4.3 4.2 4.4  CL 102 102 24*  CO2 23* 24* 24*  BUN 31* 32* 30*  CREATININE 0.5 0.5 0.4*  CALCIUM 10.2 10.0 10.1   Recent Labs    03/15/21 0000 09/05/21 0000  AST 10* 11*  ALT 8 9  ALKPHOS 94 96  ALBUMIN 3.6 4.0   Recent Labs    09/05/21 0000 09/14/21 0000 10/04/21 0000  WBC 13.9 13.9 12.8  HGB 12.9 11.8* 12.5  HCT 41 38 41  PLT 281 281 322    Lab Results  Component Value Date   TSH 2.232 03/08/2020   Lab Results  Component Value Date   HGBA1C 6.6 09/05/2021   Lab Results  Component Value Date   CHOL 131 03/15/2021   HDL 37 03/15/2021   LDLCALC 57 03/15/2021   TRIG 186 (A) 03/15/2021   CHOLHDL 4.6 02/16/2020    Significant Diagnostic Results in last 30 days:  IR Replc Gastro/Colonic Tube Percut W/Fluoro  Result Date: 09/27/2021 INDICATION: Dysphagia, stroke patient, indwelling gastrostomy is damaged externally and leaking EXAM: FLUOROSCOPIC EXCHANGE OF THE 20 FRENCH GASTROSTOMY MEDICATIONS: None. ANESTHESIA/SEDATION: Total intra-service moderate Sedation Time: None. The patient's level of consciousness and vital signs were monitored continuously by radiology nursing throughout the procedure under my direct supervision. CONTRAST:  10 cc-administered into the gastric lumen. FLUOROSCOPY TIME:  Fluoroscopy Time: 0 minutes 18 seconds (2 mGy). COMPLICATIONS: None immediate. PROCEDURE: Informed written consent was obtained from the patient's family after a thorough discussion of the procedural risks, benefits and alternatives. All questions were addressed. Maximal Sterile Barrier Technique was utilized including caps, mask, sterile gowns, sterile gloves, sterile drape, hand hygiene and skin antiseptic. A timeout was performed prior to the initiation of the procedure. Existing damaged 20 [place] gastrostomy was exchanged for a 20 French balloon retention gastrostomy advanced through the percutaneous tract into the stomach. Retention balloon inflated with 18 cc saline and retracted against anterior gastric wall. This was secured externally. Contrast injection confirms position in the stomach. Images obtained for documentation. No immediate complication. Patient tolerated procedure well. IMPRESSION: Successful fluoroscopic exchange of the 20 French balloon retention gastrostomy. Ready for use. Electronically Signed   By: Judie Petit.  Shick M.D.   On:  09/27/2021 13:20    Assessment/Plan  1. Candida infection, oral -   start on nystatin suspension 100,000 unit/gram swab 5 mL to tongue/mouth QID x2 weeks -    Oral care daily  2. Urinary tract infection without hematuria, site unspecified -   Continue Cipro 500 mg 1 tab twice a day till 10/13/2021 and  Florastor 250 mg 1 capsule twice a day till 10/19/21    Family/ staff Communication: Discussed plan of care with charge nurse  Labs/tests ordered:   None    Durenda Age, DNP, MSN, FNP-BC [facility] [phone] (Monday-Friday 8:00 a.m. - 5:00 p.m.) [phone] (after hours)

## 2021-10-19 ENCOUNTER — Non-Acute Institutional Stay (SKILLED_NURSING_FACILITY): Payer: Medicare Other | Admitting: Adult Health

## 2021-10-19 ENCOUNTER — Encounter: Payer: Self-pay | Admitting: Adult Health

## 2021-10-19 DIAGNOSIS — R1312 Dysphagia, oropharyngeal phase: Secondary | ICD-10-CM

## 2021-10-19 DIAGNOSIS — I10 Essential (primary) hypertension: Secondary | ICD-10-CM

## 2021-10-19 DIAGNOSIS — Z8673 Personal history of transient ischemic attack (TIA), and cerebral infarction without residual deficits: Secondary | ICD-10-CM | POA: Diagnosis not present

## 2021-10-19 DIAGNOSIS — Z7189 Other specified counseling: Secondary | ICD-10-CM

## 2021-10-19 DIAGNOSIS — E1151 Type 2 diabetes mellitus with diabetic peripheral angiopathy without gangrene: Secondary | ICD-10-CM | POA: Diagnosis not present

## 2021-10-19 NOTE — Progress Notes (Signed)
Location:  [facility] Room Number: [place]:  SNF (31) Provider:  Durenda Age, DNP, FNP-BC  Patient Care Team: Hendricks Limes, MD as PCP - General (Internal Medicine) Medina-Vargas, Senaida Lange, NP as Nurse Practitioner (Internal Medicine) Re[facility], [place] ([place])  Extended Emergency Contact Information Primary Emergency Contact: elzie, wahlen Mobile Phone: [phone] Relation: Son Secondary Emergency Contact: Haeleigh, Yamada Mobile Phone: [phone] Relation: Daughter  Code Status:  DNR  Goals of care: Advanced Directive information Advanced Directives 10/19/2021  Does Patient Have a Medical Advance Directive? Yes  Type of Advance Directive Out of facility DNR (pink MOST or yellow form)  Does patient want to make changes to medical advance directive? No - Patient declined  Would patient like information on creating a medical advance directive? -  Pre-existing out of facility DNR order (yellow form or pink MOST form) Yellow form placed in chart (order not valid for inpatient use)     Chief Complaint  Patient presents with   Care Plan Meeting    Advance Care Planning    HPI:  Pt is a 76 y.o. female who had a care plan meeting today attended by social worker X 2, dietitian, NP and [name], son, who attended via telephone conference. She remains to be DNR. Discussed medications, vital signs and weights. She is currently followed by palliative care. Latest weight is 140.6 lbs, stable. She currently has restorative splinting of bilateral hands. She is dependent on staff for ADL care due to quadriplegia. Son has no questions with regards to medications. The meeting lasted for 20 minutes.     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)    Essential (primary) hypertension    History of falling    MI (myocardial infarction) ([facility])    Stroke [facility])     Past Surgical History:  Procedure Laterality Date   ABDOMINAL HYSTERECTOMY     APPENDECTOMY     BACK SURGERY     IR CM INJ ANY COLONIC TUBE W/FLUORO  09/27/2020   IR GASTROSTOMY TUBE MOD SED  03/31/2020   IR REPLACE G-TUBE SIMPLE WO FLUORO  06/21/2021   IR REPLC GASTRO/COLONIC TUBE PERCUT W/FLUORO  09/27/2021    Allergies  Allergen Reactions   Allopurinol Shortness Of Breath   Tetracycline Anaphylaxis    She was placed on Doxycycline for truncal cellulitis in January 2022 w/o ADE & with improvement in the cellulitis..   Amoxicillin-Pot Clavulanate Diarrhea   Atorvastatin Other (See Comments)    Unknown reaction - reported by [facility] [place] clinic 05/10/2012   Azithromycin Other (See Comments)    Caused blisters inside and out   Codeine Other (See Comments)    Altered mental status   Lisinopril Other (See Comments)    Unknown reaction - reported by [facility], [place] clinic 12/23/2008 02/20/2020 she believes "reaction" was NP cough. No PMH of angioedema   Sulfa Antibiotics Diarrhea    Outpatient Encounter Medications as of 10/19/2021  Medication Sig   acetaminophen (TYLENOL) 325 MG tablet Take 650 mg by mouth every 6 (six) hours as needed.   Albuterol Sulfate 2.5 MG/0.5ML NEBU Inhale into the lungs every 6 (six) hours as needed.   amLODipine (NORVASC) 10 MG tablet Place 10 mg into feeding tube daily.   barrier cream (NON-SPECIFIED) CREA Apply 1 application topically 2 (two) times daily as needed (for protection.).   chlorhexidine (PERIDEX) 0.12 % solution Use as directed  in the mouth or throat 2 (two) times daily.   clopidogrel (PLAVIX) 75 MG tablet Place 1 tablet (75 mg total) into feeding tube daily.   hydrALAZINE (APRESOLINE) 25 MG tablet Place 25 mg into feeding tube every 8 (eight) hours.   insulin detemir (LEVEMIR) 100 UNIT/ML injection Inject 18 Units into the skin 2 (two) times daily.   metformin (FORTAMET) 500 MG (OSM) 24 hr tablet Take 500 mg by mouth 2 (two) times daily with a  meal. VIA G-TUBE FOR DM   metoprolol tartrate (LOPRESSOR) 25 MG tablet Place 25 mg into feeding tube 2 (two) times daily.   Nutritional Supplements (FEEDING SUPPLEMENT, GLUCERNA 1.2 CAL,) LIQD Place into feeding tube every 6 (six) hours. Place at 60 ml/hr via PEG. Water flush to 125 ml Q6h.   nystatin (MYCOSTATIN) 100000 UNIT/ML suspension Take 5 mLs by mouth 4 (four) times daily. For 2 weeks for Candida   pantoprazole sodium (PROTONIX) 40 mg/20 mL PACK Place 20 mLs (40 mg total) into feeding tube daily.   polyethylene glycol (MIRALAX / GLYCOLAX) 17 g packet Place 17 g into feeding tube daily as needed for mild constipation.   rosuvastatin (CRESTOR) 5 MG tablet Place 1 tablet (5 mg total) into feeding tube daily.   saccharomyces boulardii (FLORASTOR) 250 MG capsule Take 250 mg by mouth 2 (two) times daily. peg tube bid till 10/19/21   urea (CARMOL) 20 % cream Apply topically. TO BE APPLIED BOTH FEET DAILY   [DISCONTINUED] bisacodyl (DULCOLAX) 10 MG suppository If not relieved by MOM, give 10 mg Bisacodyl suppositiory rectally X 1 dose in 24 hours as needed   [DISCONTINUED] ciprofloxacin (CIPRO) 500 MG tablet Give one tablet via peg tube bid till 10/13/21   [DISCONTINUED] Magnesium Hydroxide (MILK OF MAGNESIA PO) If no BM in 3 days, give 30 cc Milk of Magnesium p.o. x 1 dose in 24 hours as needed (Do not use standing constipation orders for residents with renal failure CFR less than 30. Contact MD for orders)   [DISCONTINUED] Sodium Phosphates (RA SALINE ENEMA RE) If not relieved by Biscodyl suppository, give disposable Saline Enema rectally X 1 dose/24 hrs as needed   No facility-administered encounter medications on file as of 10/19/2021.    Review of Systems Unable to obtain due to nonverbal.    Immunization History  Administered Date(s) Administered   Fluad Quad(high Dose 65+) 06/14/2021   Influenza, High Dose Seasonal PF 05/04/2016   Influenza-Unspecified 06/17/2020   PFIZER(Purple  Top)SARS-COV-2 Vaccination 10/03/2019, 10/24/2019   Pneumococcal Conjugate-13 09/01/2020   Pneumococcal Polysaccharide-23 08/29/2003, 06/30/2009   Unspecified SARS-COV-2 Vaccination 08/31/2020   Zoster Recombinat (Shingrix) 01/26/2006   Zoster, Live 01/29/2006   Zoster, Unspecified 01/26/2006   Pertinent  Health Maintenance Due  Topic Date Due   FOOT EXAM  Never done   OPHTHALMOLOGY EXAM  Never done   COLONOSCOPY (Pts 45-28yrs Insurance coverage will need to be confirmed)  Never done   HEMOGLOBIN A1C  03/05/2022   INFLUENZA VACCINE  Completed   DEXA SCAN  Discontinued   Fall Risk 04/24/2020 04/25/2020 04/26/2020 04/26/2020 04/27/2020  Patient Fall Risk Level High fall risk High fall risk High fall risk High fall risk High fall risk     Vitals:   10/19/21 0948  BP: 118/66  Pulse: 93  Resp: 19  Temp: (!) 97.3 F (36.3 C)  Height: 5\' 1"  (1.549 m)   Body mass index is 26.57 kg/m.  Physical Exam Constitutional:      General: She  is not in acute distress. HENT:     Head: Normocephalic and atraumatic.     Nose: Nose normal.     Mouth/Throat:     Mouth: Mucous membranes are moist.  Eyes:     Conjunctiva/sclera: Conjunctivae normal.  Cardiovascular:     Rate and Rhythm: Normal rate and regular rhythm.  Pulmonary:     Effort: Pulmonary effort is normal.     Breath sounds: Normal breath sounds.  Abdominal:     General: Bowel sounds are normal.     Palpations: Abdomen is soft.     Comments: Has peg tube  Musculoskeletal:        General: No swelling.     Cervical back: Normal range of motion.  Skin:    General: Skin is warm and dry.  Neurological:     General: No focal deficit present.     Mental Status: Mental status is at baseline.     Comments: Quadriplegic. Nonverbal.  Psychiatric:        Mood and Affect: Mood normal.        Behavior: Behavior normal.       Labs reviewed: Recent Labs    03/15/21 0000 03/24/21 0000 09/05/21 0000  NA 139 143 143  K 4.3 4.2  4.4  CL 102 102 24*  CO2 23* 24* 24*  BUN 31* 32* 30*  CREATININE 0.5 0.5 0.4*  CALCIUM 10.2 10.0 10.1   Recent Labs    03/15/21 0000 09/05/21 0000  AST 10* 11*  ALT 8 9  ALKPHOS 94 96  ALBUMIN 3.6 4.0   Recent Labs    09/14/21 0000 10/03/21 0000 10/04/21 0000  WBC 13.9 12.5 12.8  NEUTROABS  --  8.90  --   HGB 11.8* 12.4 12.5  HCT 38 38 41  PLT 281 290 322   Lab Results  Component Value Date   TSH 2.232 03/08/2020   Lab Results  Component Value Date   HGBA1C 6.6 09/05/2021   Lab Results  Component Value Date   CHOL 131 03/15/2021   HDL 37 03/15/2021   LDLCALC 57 03/15/2021   TRIG 186 (A) 03/15/2021   CHOLHDL 4.6 02/16/2020    Significant Diagnostic Results in last 30 days:  IR Replc Gastro/Colonic Tube Percut W/Fluoro  Result Date: 09/27/2021 INDICATION: Dysphagia, stroke patient, indwelling gastrostomy is damaged externally and leaking EXAM: FLUOROSCOPIC EXCHANGE OF THE 20 FRENCH GASTROSTOMY MEDICATIONS: None. ANESTHESIA/SEDATION: Total intra-service moderate Sedation Time: None. The patient's level of consciousness and vital signs were monitored continuously by radiology nursing throughout the procedure under my direct supervision. CONTRAST:  10 cc-administered into the gastric lumen. FLUOROSCOPY TIME:  Fluoroscopy Time: 0 minutes 18 seconds (2 mGy). COMPLICATIONS: None immediate. PROCEDURE: Informed written consent was obtained from the patient's family after a thorough discussion of the procedural risks, benefits and alternatives. All questions were addressed. Maximal Sterile Barrier Technique was utilized including caps, mask, sterile gowns, sterile gloves, sterile drape, hand hygiene and skin antiseptic. A timeout was performed prior to the initiation of the procedure. Existing damaged 13 [place] gastrostomy was exchanged for a 41 French balloon retention gastrostomy advanced through the percutaneous tract into the stomach. Retention balloon inflated with 18 cc  saline and retracted against anterior gastric wall. This was secured externally. Contrast injection confirms position in the stomach. Images obtained for documentation. No immediate complication. Patient tolerated procedure well. IMPRESSION: Successful fluoroscopic exchange of the 20 French balloon retention gastrostomy. Ready for use. Electronically Signed   By:  M.  Shick M.D.   On: 09/27/2021 13:20    Assessment/Plan  1. ACP (advance care planning) -   Remains to be DNR -    Followed by palliative care -     Discussed medications, vital signs and weights.  2. History of CVA (cerebrovascular accident) -   Stable, continue clopidogrel and rosuvastatin  3. Essential (primary) hypertension -Blood pressure well controlled Continue current medications  4. DM (diabetes mellitus), type 2 with peripheral vascular complications (HCC) Lab Results  Component Value Date   HGBA1C 6.6 09/05/2021   -CBGs stable, continue Levemir and metformin  5. Oropharyngeal dysphagia -    Continue Glucerna at 60 mL/hour via PEG tube -    Aspiration precautions -    Oral care daily   Family/ staff Communication: Discussed plan of care with son and IDT.  Labs/tests ordered:  None    Durenda Age, DNP, MSN, FNP-BC [facility] [phone] (Monday-Friday 8:00 a.m. - 5:00 p.m.) [phone] (after hours)

## 2021-10-21 ENCOUNTER — Other Ambulatory Visit: Payer: Self-pay

## 2021-10-21 ENCOUNTER — Non-Acute Institutional Stay: Payer: Medicare Other | Admitting: Hospice

## 2021-10-21 DIAGNOSIS — Z515 Encounter for palliative care: Secondary | ICD-10-CM

## 2021-10-21 DIAGNOSIS — R1312 Dysphagia, oropharyngeal phase: Secondary | ICD-10-CM

## 2021-10-21 DIAGNOSIS — F0390 Unspecified dementia without behavioral disturbance: Secondary | ICD-10-CM

## 2021-10-21 DIAGNOSIS — R5381 Other malaise: Secondary | ICD-10-CM

## 2021-10-21 NOTE — Progress Notes (Signed)
[facility] Consult Note Telephone: [phone]  Fax: [phone]  PATIENT NAME: Tasha [name] DOB: 15-Nov-1945 MRN: [id number]  PRIMARY CARE PROVIDER:   Hendricks Limes, MD Hendricks Limes, MD [address] [place],  [place] [address]  REFERRING PROVIDER: Hendricks Limes, MD Hendricks Limes, [place],  [place] [address]  RESPONSIBLE PARTY:  Brad Primary Emergency Contact: rosalea, poff Mobile Phone: [phone] Relation: Son Secondary Emergency Contact: Marvette, Figuero Mobile Phone: [phone] Relation: Daughter Contact Information     Name Relation Home Work [place], [place] Son   [phone]   Edmae, [place] Daughter   [phone]       Visit is to build trust and highlight Palliative Medicine as specialized medical care for people living with serious illness, aimed at facilitating better quality of life through symptoms relief, assisting with advance care planning and complex medical decision making. This is a follow up visit.  RECOMMENDATIONS/PLAN:   Advance Care Planning/Code Status: Patient is a DO NOT RESUSCITATE  Goals of Care: Goals of care include to maximize quality of life and symptom management. Family is not ready for hospice service currently.  Palliative care team will continue to support patient, patient's family, and medical team.  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  Quadriplegic,  bed-bound, non verbal, nothing by mouth, nutrition via peg tube. Total care. Continue passive ROM restorative exercises Dysphagia: PEG tube feeding Glucerna 1.2 Cal at 60 mL/h is ongoing.  Aspiration precautions in place. Dementia: Fast 7D. Incontinent of bowel and bladder, minimally interactive. Continue ongoing supportive care. Patient will benefit from hospice service when family is ready for it  Follow up: Palliative care will continue to follow for complex medical decision  making, advance care planning, and clarification of goals. Return 6 weeks or prn. Encouraged to call provider sooner with any concerns.  CHIEF COMPLAINT: Palliative follow up  HISTORY OF PRESENT ILLNESS:  Loran Rigoli a 76 y.o. female with multiple medical problems including advanced dementia, physical debility secondary to CVA with residual deficits-unable to move all extremities, nutrition via peg tube, total care. History of  hypertension, type 2 diabetes mellitus.  Patient unable to meaningfully interact or discuss with NP, in no acute distress, FLACC 0.  Nursing with no complaints.  History obtained from review of EMR, discussion with primary team, family and/or patient. Records reviewed and summarized above. All 10 point systems reviewed and are negative except as documented in history of present illness above  Review and summarization of Epic records shows history from other than patient.   Palliative Care was asked to follow this patient o help address complex decision making in the context of advance care planning and goals of care clarification.   PHYSICAL EXAM  General: In no acute distress, appropriately dressed Cardiovascular: regular rate and rhythm; no edema in BLE Pulmonary: no cough, no increased work of breathing, normal respiratory effort Abdomen: soft, non tender, no guarding, positive bowel sounds in all quadrants, PEG tube present GU:  no suprapubic tenderness Eyes: Normal lids, no discharge ENMT: Moist mucous membranes Musculoskeletal:  weakness, bedbound, not able to move extremities Skin: no rash to visible skin, warm without cyanosis,  Psych: non-anxious affect Neurological: Weakness but otherwise non focal, nonverbal during visit Heme/lymph/immuno: no bruises, no bleeding  PERTINENT MEDICATIONS:  Outpatient Encounter Medications as of 10/21/2021  Medication Sig   acetaminophen (TYLENOL) 325 MG tablet Take 650 mg by mouth every  6 (six) hours as needed.   Albuterol  Sulfate 2.5 MG/0.5ML NEBU Inhale into the lungs every 6 (six) hours as needed.   amLODipine (NORVASC) 10 MG tablet Place 10 mg into feeding tube daily.   barrier cream (NON-SPECIFIED) CREA Apply 1 application topically 2 (two) times daily as needed (for protection.).   chlorhexidine (PERIDEX) 0.12 % solution Use as directed in the mouth or throat 2 (two) times daily.   clopidogrel (PLAVIX) 75 MG tablet Place 1 tablet (75 mg total) into feeding tube daily.   hydrALAZINE (APRESOLINE) 25 MG tablet Place 25 mg into feeding tube every 8 (eight) hours.   insulin detemir (LEVEMIR) 100 UNIT/ML injection Inject 18 Units into the skin 2 (two) times daily.   metformin (FORTAMET) 500 MG (OSM) 24 hr tablet Take 500 mg by mouth 2 (two) times daily with a meal. VIA G-TUBE FOR DM   metoprolol tartrate (LOPRESSOR) 25 MG tablet Place 25 mg into feeding tube 2 (two) times daily.   Nutritional Supplements (FEEDING SUPPLEMENT, GLUCERNA 1.2 CAL,) LIQD Place into feeding tube every 6 (six) hours. Place at 60 ml/hr via PEG. Water flush to 125 ml Q6h.   nystatin (MYCOSTATIN) 100000 UNIT/ML suspension Take 5 mLs by mouth 4 (four) times daily. For 2 weeks for Candida   pantoprazole sodium (PROTONIX) 40 mg/20 mL PACK Place 20 mLs (40 mg total) into feeding tube daily.   polyethylene glycol (MIRALAX / GLYCOLAX) 17 g packet Place 17 g into feeding tube daily as needed for mild constipation.   rosuvastatin (CRESTOR) 5 MG tablet Place 1 tablet (5 mg total) into feeding tube daily.   saccharomyces boulardii (FLORASTOR) 250 MG capsule Take 250 mg by mouth 2 (two) times daily. peg tube bid till 10/19/21   urea (CARMOL) 20 % cream Apply topically. TO BE APPLIED BOTH FEET DAILY   No facility-administered encounter medications on file as of 10/21/2021.    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   Amoxicillin-Pot Clavulanate Diarrhea   Atorvastatin Other (See Comments)    Unknown reaction - reported by [facility] [place] clinic 05/10/2012   Azithromycin Other (See Comments)    Caused blisters inside and out   Codeine Other (See Comments)    Altered mental status   Lisinopril Other (See Comments)    Unknown reaction - reported by [facility], [place] clinic 12/23/2008 02/20/2020 she believes "reaction" was NP cough. No PMH of angioedema   Sulfa Antibiotics Diarrhea      I spent 45 minutes providing this consultation; this includes time spent with patient/family, chart review and documentation. More than 50% of the time in this consultation was spent on counseling and coordinating communication   Thank you for the opportunity to participate in the care of Ananda Sjoquist Please call our office at [phone] if we can be of additional assistance.  Note: Portions of this note were generated with [employer]. Dictation errors may occur despite best attempts at proofreading.  Teodoro Spray, NP

## 2021-10-24 LAB — COMPREHENSIVE METABOLIC PANEL
Albumin: 3.5 (ref 3.5–5.0)
Calcium: 9.3 (ref 8.7–10.7)
eGFR: >90

## 2021-10-24 LAB — BASIC METABOLIC PANEL WITH GFR
BUN: 23 — AB (ref 4–21)
CO2: 24 — AB (ref 13–22)
Chloride: 99 (ref 99–108)
Creatinine: 0.4 — AB (ref 0.5–1.1)
Glucose: 119
Potassium: 4 meq/L (ref 3.5–5.1)
Sodium: 135 — AB (ref 137–147)

## 2021-10-24 LAB — HEPATIC FUNCTION PANEL
ALT: 11 U/L (ref 7–35)
AST: 13 (ref 13–35)
Alkaline Phosphatase: 66 (ref 25–125)
Bilirubin, Total: 0.5

## 2021-11-02 ENCOUNTER — Other Ambulatory Visit: Payer: Self-pay

## 2021-11-02 ENCOUNTER — Emergency Department (HOSPITAL_COMMUNITY): Payer: Medicare Other

## 2021-11-02 ENCOUNTER — Emergency Department (HOSPITAL_COMMUNITY)
Admission: EM | Admit: 2021-11-02 | Discharge: 2021-11-03 | Disposition: A | Discharge status: Skilled Nursing Facility | Payer: Medicare Other | Attending: Emergency Medicine | Admitting: Emergency Medicine

## 2021-11-02 ENCOUNTER — Encounter (HOSPITAL_COMMUNITY): Payer: Self-pay

## 2021-11-02 DIAGNOSIS — K9429 Other complications of gastrostomy: Secondary | ICD-10-CM | POA: Insufficient documentation

## 2021-11-02 DIAGNOSIS — Z431 Encounter for attention to gastrostomy: Secondary | ICD-10-CM

## 2021-11-02 DIAGNOSIS — Z20822 Contact with and (suspected) exposure to covid-19: Secondary | ICD-10-CM | POA: Diagnosis not present

## 2021-11-02 DIAGNOSIS — K9423 Gastrostomy malfunction: Secondary | ICD-10-CM

## 2021-11-02 DIAGNOSIS — Z794 Long term (current) use of insulin: Secondary | ICD-10-CM | POA: Diagnosis not present

## 2021-11-02 LAB — COMPREHENSIVE METABOLIC PANEL
ALT: 10 U/L (ref 0–44)
AST: 23 U/L (ref 15–41)
Albumin: 3.2 g/dL — ABNORMAL LOW (ref 3.5–5.0)
Alkaline Phosphatase: 64 U/L (ref 38–126)
Anion gap: 12 (ref 5–15)
BUN: 21 mg/dL (ref 8–23)
CO2: 21 mmol/L — ABNORMAL LOW (ref 22–32)
Calcium: 9.9 mg/dL (ref 8.9–10.3)
Chloride: 102 mmol/L (ref 98–111)
Creatinine, Ser: 0.36 mg/dL — ABNORMAL LOW (ref 0.44–1.00)
GFR, Estimated: >60 mL/min (ref >60)
Glucose, Bld: 99 mg/dL (ref 70–99)
Potassium: 4.5 mmol/L (ref 3.5–5.1)
Sodium: 135 mmol/L (ref 135–145)
Total Bilirubin: 1.2 mg/dL (ref 0.3–1.2)
Total Protein: 6.8 g/dL (ref 6.5–8.1)

## 2021-11-02 LAB — CBC WITH DIFFERENTIAL/PLATELET
Abs Immature Granulocytes: 0.2 10*3/uL — ABNORMAL HIGH (ref 0.00–0.07)
Basophils Absolute: 0.1 10*3/uL (ref 0.0–0.1)
Basophils Relative: 1 %
Eosinophils Absolute: 0.6 10*3/uL — ABNORMAL HIGH (ref 0.0–0.5)
Eosinophils Relative: 5 %
HCT: 40.7 % (ref 36.0–46.0)
Hemoglobin: 13.2 g/dL (ref 12.0–15.0)
Immature Granulocytes: 2 %
Lymphocytes Relative: 23 %
Lymphs Abs: 2.9 10*3/uL (ref 0.7–4.0)
MCH: 29.5 pg (ref 26.0–34.0)
MCHC: 32.4 g/dL (ref 30.0–36.0)
MCV: 91.1 fL (ref 80.0–100.0)
Monocytes Absolute: 0.7 10*3/uL (ref 0.1–1.0)
Monocytes Relative: 6 %
Neutro Abs: 8.1 10*3/uL — ABNORMAL HIGH (ref 1.7–7.7)
Neutrophils Relative %: 63 %
Platelets: 370 10*3/uL (ref 150–400)
RBC: 4.47 MIL/uL (ref 3.87–5.11)
RDW: 15.7 % — ABNORMAL HIGH (ref 11.5–15.5)
WBC: 12.7 10*3/uL — ABNORMAL HIGH (ref 4.0–10.5)
nRBC: 0 % (ref 0.0–0.2)

## 2021-11-02 LAB — CBG MONITORING, ED: Glucose-Capillary: 101 mg/dL — ABNORMAL HIGH (ref 70–99)

## 2021-11-02 LAB — RESP PANEL BY RT-PCR (FLU A&B, COVID) ARPGX2
Influenza A by PCR: NEGATIVE
Influenza B by PCR: NEGATIVE
SARS Coronavirus 2 by RT PCR: NEGATIVE

## 2021-11-02 IMAGING — CR DG ABDOMEN 1V
1 series · 1 of 1 positions shown · non-contrast
Comparison: [DATE]

CLINICAL DATA: Evaluate location of gastrostomy tube

EXAM:
ABDOMEN - 1 VIEW

[abdomen kub]
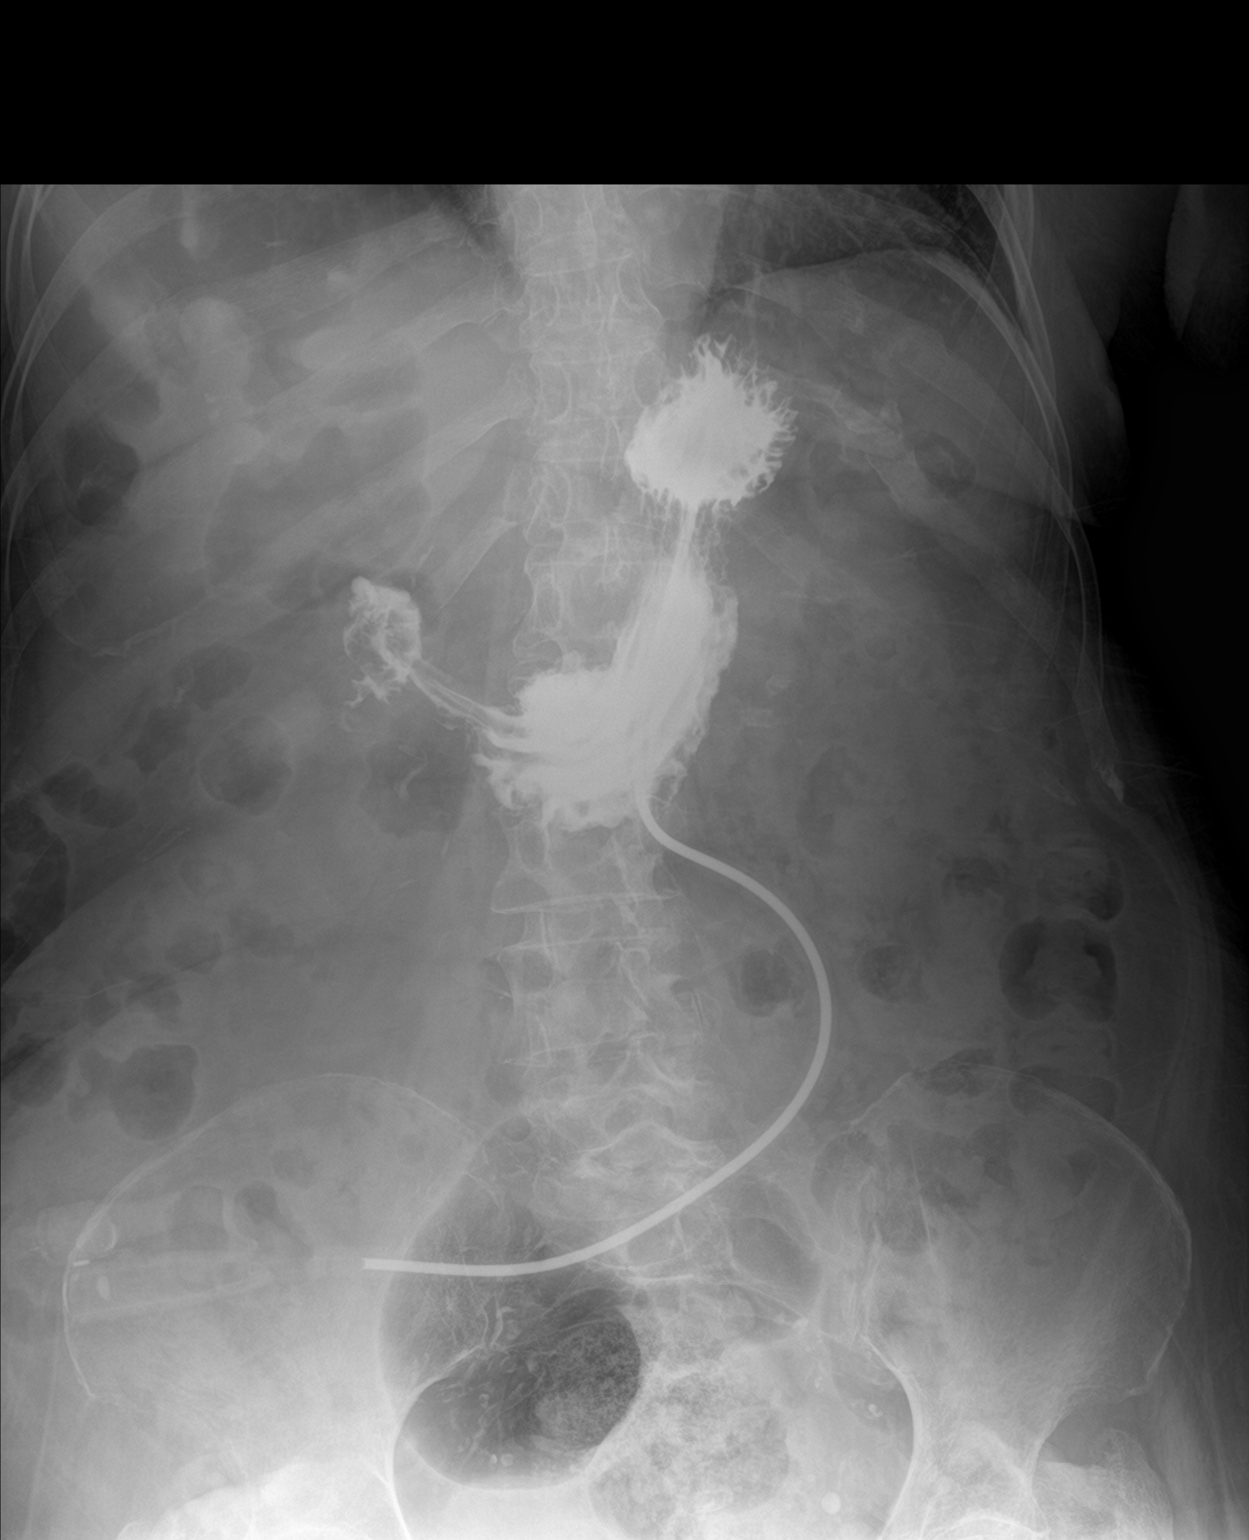

[1 of 1 positions shown; findings below may reference images not displayed]

FINDINGS: Contrast injected through percutaneous gastrostomy tube is noted
within the lumen of stomach. There is no demonstrable extravasation.
IMPRESSION: Tip of percutaneous gastrostomy tube is seen in the lumen of
stomach.

## 2021-11-02 MED ORDER — LACTATED RINGERS IV SOLN: INTRAVENOUS | Status: AC

## 2021-11-02 MED ORDER — LACTATED RINGERS IV BOLUS
1000.0000 mL | Freq: Once | INTRAVENOUS | Status: AC
  Administered 2021-11-02: 1000 mL via INTRAVENOUS

## 2021-11-02 MED ORDER — DIATRIZOATE MEGLUMINE & SODIUM 66-10 % PO SOLN
30.0000 mL | Freq: Once | ORAL | Status: AC
  Administered 2021-11-02: 30 mL
  Filled 2021-11-02: qty 30

## 2021-11-02 NOTE — ED Notes (Signed)
MD in room, unable to pass a 53fr. G tube, 18 fr. Ordered from OR ?

## 2021-11-02 NOTE — Progress Notes (Signed)
RNCM received call from [facility], stating that Dr. Kathrynn Humble is requesting SNF placement. This RNCM notified SW to initiate SNF placement process. ?

## 2021-11-02 NOTE — ED Notes (Signed)
Pt linen changed, gown changed, and cleaned up and brief place along with purewick.  ?

## 2021-11-02 NOTE — ED Triage Notes (Signed)
Presents via ems with c/o g tube dislodged for one hour ?

## 2021-11-02 NOTE — ED Provider Notes (Signed)
[facility] EMERGENCY DEPARTMENT Provider Note   CSN: [id number] Arrival date & time: 11/02/21  1603     History  Chief Complaint  Patient presents with   GI Problem    Gastric tube dilodged    Tasha [name] is a 76 y.o. female.  HPI     76 year old female comes into the ER from nursing home with gastric tube dislodgment.  Level 5 caveat, patient is unable to provide verbal response  Is called the nursing home.  They indicated that patient received her morning feed/meds between 9 and 10 AM.  Thereafter, in the afternoon they noted that patient's G-tube was dislodged.  Called son -message left. Home Medications Prior to Admission medications   Medication Sig Start Date End Date Taking? Authorizing Provider  acetaminophen (TYLENOL) 325 MG tablet Take 650 mg by mouth every 6 (six) hours as needed.    [provider]  Albuterol Sulfate 2.5 MG/0.5ML NEBU Inhale into the lungs every 6 (six) hours as needed.    [provider]  amLODipine (NORVASC) 10 MG tablet Place 10 mg into feeding tube daily.    [provider]  barrier cream (NON-SPECIFIED) CREA Apply 1 application topically 2 (two) times daily as needed (for protection.).    [provider]  chlorhexidine (PERIDEX) 0.12 % solution Use as directed in the mouth or throat 2 (two) times daily.    [provider]  clopidogrel (PLAVIX) 75 MG tablet Place 1 tablet (75 mg total) into feeding tube daily. 04/27/20   Kathlen Mody, MD  hydrALAZINE (APRESOLINE) 25 MG tablet Place 25 mg into feeding tube every 8 (eight) hours.    [provider]  insulin detemir (LEVEMIR) 100 UNIT/ML injection Inject 18 Units into the skin 2 (two) times daily.    [provider]  metformin (FORTAMET) 500 MG (OSM) 24 hr tablet Take 500 mg by mouth 2 (two) times daily with a meal. VIA G-TUBE FOR DM    [provider]  metoprolol tartrate (LOPRESSOR) 25 MG tablet Place 25  mg into feeding tube 2 (two) times daily.    [provider]  Nutritional Supplements (FEEDING SUPPLEMENT, GLUCERNA 1.2 CAL,) LIQD Place into feeding tube every 6 (six) hours. Place at 60 ml/hr via PEG. Water flush to 125 ml Q6h.    [provider]  nystatin (MYCOSTATIN) 100000 UNIT/ML suspension Take 5 mLs by mouth 4 (four) times daily. For 2 weeks for Candida    [provider]  pantoprazole sodium (PROTONIX) 40 mg/20 mL PACK Place 20 mLs (40 mg total) into feeding tube daily. 04/27/20   Kathlen Mody, MD  polyethylene glycol (MIRALAX / GLYCOLAX) 17 g packet Place 17 g into feeding tube daily as needed for mild constipation. 04/27/20   Kathlen Mody, MD  rosuvastatin (CRESTOR) 5 MG tablet Place 1 tablet (5 mg total) into feeding tube daily. 04/27/20   Kathlen Mody, MD  saccharomyces boulardii (FLORASTOR) 250 MG capsule Take 250 mg by mouth 2 (two) times daily. peg tube bid till 10/19/21    [provider]  urea (CARMOL) 20 % cream Apply topically. TO BE APPLIED BOTH FEET DAILY    [provider]      Allergies    Allopurinol, Tetracycline, Amoxicillin-pot clavulanate, Atorvastatin, Azithromycin, Codeine, Lisinopril, and Sulfa antibiotics    Review of Systems   Review of Systems  Physical Exam Updated Vital Signs BP (!) 141/61    Pulse 66    Temp 98.2 F (  36.8 C) (Axillary)    Resp 13    Ht 5\' 1"  (1.549 m)    Wt 63.8 kg    SpO2 95%    BMI 26.58 kg/m  Physical Exam Vitals and nursing note reviewed.  Abdominal:     Palpations: Abdomen is soft.     Tenderness: There is no abdominal tenderness.     Comments: No G-tube in the stoma    ED Results / Procedures / Treatments   Labs (all labs ordered are listed, but only abnormal results are displayed) Labs Reviewed  COMPREHENSIVE METABOLIC PANEL - Abnormal; Notable for the following components:      Result Value   CO2 21 (*)    Creatinine, Ser 0.36 (*)    Albumin 3.2 (*)    All other components  within normal limits  CBC WITH DIFFERENTIAL/PLATELET - Abnormal; Notable for the following components:   WBC 12.7 (*)    RDW 15.7 (*)    Neutro Abs 8.1 (*)    Eosinophils Absolute 0.6 (*)    Abs Immature Granulocytes 0.20 (*)    All other components within normal limits  RESP PANEL BY RT-PCR (FLU A&B, COVID) [id number]    EKG None  Radiology DG ABDOMEN PEG TUBE LOCATION  Result Date: 11/02/2021 CLINICAL DATA:  Evaluate location of gastrostomy tube EXAM: ABDOMEN - 1 VIEW COMPARISON:  06/21/2021 FINDINGS: Contrast injected through percutaneous gastrostomy tube is noted within the lumen of stomach. There is no demonstrable extravasation. IMPRESSION: Tip of percutaneous gastrostomy tube is seen in the lumen of stomach. Electronically Signed   By: Ernie AvenaPalani  Rathinasamy M.D.   On: 11/02/2021 20:46    Procedures Gastrostomy tube replacement  Date/Time: 11/02/2021 11:29 PM Performed by: Derwood KaplanNanavati, Lethia Donlon, MD Authorized by: Derwood KaplanNanavati, Tarus Briski, MD  Consent: Verbal consent obtained. Consent given by: guardian Patient identity confirmed: arm band Time out: Immediately prior to procedure a "time out" was called to verify the correct patient, procedure, equipment, support staff and site/side marked as required. Preparation: Patient was prepped and draped in the usual sterile fashion. Local anesthesia used: no  Anesthesia: Local anesthesia used: no  Sedation: Patient sedated: no  Patient tolerance: patient tolerated the procedure well with no immediate complications Comments: Failed G-tube replacement by 20 JamaicaFrench and 18 French gastrotomy tube   Gastrostomy tube replacement  Date/Time: 11/02/2021 11:33 PM Performed by: Derwood KaplanNanavati, Nichlos Kunzler, MD Authorized by: Derwood KaplanNanavati, Myrl Bynum, MD  Consent: Verbal consent obtained. Risks and benefits: risks, benefits and alternatives were discussed Consent given by: guardian Patient understanding: patient states understanding of the procedure being performed Patient  identity confirmed: arm band Time out: Immediately prior to procedure a "time out" was called to verify the correct patient, procedure, equipment, support staff and site/side marked as required. Preparation: Patient was prepped and draped in the usual sterile fashion. Local anesthesia used: no  Anesthesia: Local anesthesia used: no  Sedation: Patient sedated: no  Comments: 16 French Foley catheter placed -x-ray confirms placement      Medications Ordered in ED Medications  lactated ringers infusion (has no administration in time range)  lactated ringers bolus 1,000 mL (1,000 mLs Intravenous New Bag/Given 11/02/21 2141)  diatrizoate meglumine-sodium (GASTROGRAFIN) 66-10 % solution 30 mL (30 mLs Per Tube Given 11/02/21 2044)    ED Course/ Medical Decision Making/ A&P                           Medical Decision Making Amount and/or Complexity of Data  Reviewed Labs: ordered. Radiology: ordered.  Risk Prescription drug management.  This patient presents to the ED with chief complaint(s) of  G tube dislodgment with pertinent past medical history of stroke and resultant dysphagia.  It appears that the G-tube was last used earlier this morning.  Concerns for possible narrowing of the stoma.   I reviewed patient's previous notes.  It appears that she had a 20 [place] G-tube placed.  We will attempt to put 20 [place] G-tube, if that fails then we will try 65 [place].  If that fails then we will put in a Foley catheter that is 84 [place].  Basic labs have been ordered.  IV fluids ordered.   Additional history obtained: Additional history obtained from nursing home/care facility Records reviewed previous admission documents   Imaging Studies: I ordered and independently visualized and interpreted the following imaging X-ray of the abdomen which showed enteric placement of the Foley catheter   The interpretation of the imaging was limited to assessing for emergent pathology, for which  purpose it was ordered.  Consultations Obtained: I requested consultation with the radiologist -Dr. Grace Isaac.  , and discussed  findings as well as pertinent plan - they recommend that we enter IR gastrostomy tube placement order, they will complete the task in the morning.   Complexity of problems addressed: Patients presentation is most consistent with  acute, uncomplicated illness During patient's assessment  Disposition: Patient will need to wait for IR gastrotomy tube placement. Procedure scheduled for tomorrow morning.  We will give IV fluid right now.  NPO.  Final Clinical Impression(s) / ED Diagnoses Final diagnoses:  PEG (percutaneous endoscopic gastrostomy) adjustment/replacement/removal [facility])    Rx / DC Orders ED Discharge Orders     None         Derwood Kaplan, MD 11/02/21 2335

## 2021-11-03 ENCOUNTER — Emergency Department (HOSPITAL_COMMUNITY): Payer: Medicare Other

## 2021-11-03 DIAGNOSIS — K9429 Other complications of gastrostomy: Secondary | ICD-10-CM | POA: Diagnosis not present

## 2021-11-03 HISTORY — PX: IR REPLACE G-TUBE SIMPLE WO FLUORO: IMG2323

## 2021-11-03 LAB — CBG MONITORING, ED: Glucose-Capillary: 114 mg/dL — ABNORMAL HIGH (ref 70–99)

## 2021-11-03 MED ORDER — LIDOCAINE VISCOUS HCL 2 % MT SOLN
OROMUCOSAL | Status: AC
  Filled 2021-11-03: qty 15

## 2021-11-03 NOTE — Progress Notes (Signed)
Patient is from [facility]. Please call report to [phone] when patient is medically cleared.  ?

## 2021-11-03 NOTE — Discharge Instructions (Signed)
You were seen in the emergency department for evaluation of a dislodged G-tube.  Interventional radiology placed a new tube and it is approved for use.  Return to the emergency department if any worsening or concerning symptoms ?

## 2021-11-03 NOTE — ED Notes (Signed)
PTAR called; pt next ?

## 2021-11-03 NOTE — Progress Notes (Signed)
[facility] ED [address] [facility]) Hospital Liaison note: ? ?This patient is currently enrolled in [facility] outpatient-based Palliative Care. Will continue to follow for disposition. ? ?Please call with any outpatient palliative questions or concerns. ? ?Thank you, ?Lorelee Market, LPN ?[facility] Hospital Liaison ?[phone] ?

## 2021-11-16 ENCOUNTER — Non-Acute Institutional Stay (SKILLED_NURSING_FACILITY): Payer: Medicare Other | Admitting: Adult Health

## 2021-11-16 ENCOUNTER — Encounter: Payer: Self-pay | Admitting: Adult Health

## 2021-11-16 DIAGNOSIS — Z8673 Personal history of transient ischemic attack (TIA), and cerebral infarction without residual deficits: Secondary | ICD-10-CM | POA: Diagnosis not present

## 2021-11-16 DIAGNOSIS — R1312 Dysphagia, oropharyngeal phase: Secondary | ICD-10-CM

## 2021-11-16 DIAGNOSIS — I10 Essential (primary) hypertension: Secondary | ICD-10-CM

## 2021-11-16 DIAGNOSIS — E1151 Type 2 diabetes mellitus with diabetic peripheral angiopathy without gangrene: Secondary | ICD-10-CM

## 2021-11-16 NOTE — Progress Notes (Signed)
Location:  [facility] Room Number: 226 A Place of Service:  SNF (31) Provider:  Kenard Gower, DNP, FNP-BC  Patient Care Team: Pecola Lawless, MD as PCP - General (Internal Medicine) Medina-Vargas, Margit Banda, NP as Nurse Practitioner (Internal Medicine) Rehab, [facility]Skilled Nursing Facility)  Extended Emergency Contact Information Primary Emergency Contact: brietta, kellar Mobile Phone: [phone] Relation: Son Secondary Emergency Contact: Tziporah, Eggers Mobile Phone: [phone] Relation: Daughter  Code Status:  DNR  Goals of care: Advanced Directive information    11/16/2021   10:45 AM  Advanced Directives  Does Patient Have a Medical Advance Directive? Yes  Type of Advance Directive Out of facility DNR (pink MOST or yellow form)  Does patient want to make changes to medical advance directive? No - Patient declined  Pre-existing out of facility DNR order (yellow form or pink MOST form) Yellow form placed in chart (order not valid for inpatient use)     Chief Complaint  Patient presents with   Medical Management of Chronic Issues    Routine Visit    HPI:  Pt is a 76 y.o. female seen today for medical management of chronic diseases.  She is a long-term care resident of [facility]. She has a PMH of dyslipidemia, anxiety and essential hypertension.  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, she states Plavix 75 mg 1 tab daily and rosuvastatin 5 mg 1 tab daily  DM (diabetes mellitus), type 2 with peripheral vascular complications (HCC) -  CBGs ranging from 115-224, takes Levemir 18 units subcutaneously twice a day and metformin 500 mg 1 tab twice a day  Oropharyngeal dysphagia -    PEG tube was recently reinserted in the ED because it fell out    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)    Essential (primary) hypertension    History of falling    MI (myocardial infarction) (HCC)    Stroke [facility])    Past Surgical History:  Procedure Laterality Date   ABDOMINAL HYSTERECTOMY     APPENDECTOMY     BACK SURGERY     IR CM INJ ANY COLONIC TUBE W/FLUORO  09/27/2020   IR GASTROSTOMY TUBE MOD SED  03/31/2020   IR REPLACE G-TUBE SIMPLE WO FLUORO  06/21/2021   IR REPLACE G-TUBE SIMPLE WO FLUORO  11/03/2021   IR REPLC GASTRO/COLONIC TUBE PERCUT W/FLUORO  09/27/2021    Allergies  Allergen Reactions   Allopurinol Shortness Of Breath   Tetracycline Anaphylaxis    She was placed on Doxycycline for truncal cellulitis in January 2022 w/o ADE & with improvement in the cellulitis..   Amoxicillin-Pot Clavulanate Diarrhea   Atorvastatin Other (See Comments)    Unknown reaction - reported by [facility] [place] clinic 05/10/2012   Azithromycin Other (See Comments)    Caused blisters inside and out   Codeine Other (See Comments)    Altered mental status   Lisinopril Other (See Comments)    Unknown reaction - reported by [facility] [place] clinic 12/23/2008 02/20/2020 she believes "reaction" was NP cough. No PMH of angioedema   Sulfa Antibiotics Diarrhea    Outpatient Encounter Medications as of 11/16/2021  Medication Sig   acetaminophen (TYLENOL) 325  MG tablet Take 650 mg by mouth every 6 (six) hours as needed.   Albuterol Sulfate 2.5 MG/0.5ML NEBU Inhale into the lungs every 6 (six) hours as needed.   amLODipine (NORVASC) 10 MG tablet Place 10 mg into feeding tube daily.   barrier cream (NON-SPECIFIED) CREA Apply 1 application topically 2 (two) times daily as needed (for protection.).   chlorhexidine (PERIDEX) 0.12 % solution Use as directed in the mouth or throat 2 (two) times daily.   clopidogrel (PLAVIX) 75 MG tablet Place 1  tablet (75 mg total) into feeding tube daily.   hydrALAZINE (APRESOLINE) 25 MG tablet Place 25 mg into feeding tube every 8 (eight) hours.   insulin detemir (LEVEMIR) 100 UNIT/ML injection Inject 18 Units into the skin 2 (two) times daily.   metformin (FORTAMET) 500 MG (OSM) 24 hr tablet Take 500 mg by mouth 2 (two) times daily with a meal. VIA G-TUBE FOR DM   metoprolol tartrate (LOPRESSOR) 25 MG tablet Place 25 mg into feeding tube 2 (two) times daily.   Nutritional Supplements (FEEDING SUPPLEMENT, GLUCERNA 1.2 CAL,) LIQD Place into feeding tube every 6 (six) hours. Place at 60 ml/hr via PEG. Water flush to 125 ml Q6h.   pantoprazole sodium (PROTONIX) 40 mg/20 mL PACK Place 20 mLs (40 mg total) into feeding tube daily.   polyethylene glycol (MIRALAX / GLYCOLAX) 17 g packet Place 17 g into feeding tube daily as needed for mild constipation.   rosuvastatin (CRESTOR) 5 MG tablet Place 1 tablet (5 mg total) into feeding tube daily.   urea (CARMOL) 20 % cream Apply topically. TO BE APPLIED BOTH FEET DAILY   [DISCONTINUED] guaiFENesin (ROBITUSSIN) 100 MG/5ML liquid Place 10 mLs into feeding tube in the morning, at noon, and at bedtime. For 2 weeks started 10/28/21-11/11/21   [DISCONTINUED] nystatin (MYCOSTATIN) 100000 UNIT/ML suspension Take 5 mLs by mouth 4 (four) times daily. For 2 weeks for Candida   [DISCONTINUED] saccharomyces boulardii (FLORASTOR) 250 MG capsule Take 250 mg by mouth 2 (two) times daily. peg tube bid till 10/19/21 (Patient not taking: Reported on 11/02/2021)   No facility-administered encounter medications on file as of 11/16/2021.    Review of Systems    unable to obtain due to nonverbal   Immunization History  Administered Date(s) Administered   Fluad Quad(high Dose 65+) 06/14/2021   Influenza, High Dose Seasonal PF 05/04/2016   Influenza-Unspecified 06/17/2020   PFIZER(Purple Top)SARS-COV-2 Vaccination 10/03/2019, 10/24/2019   Pneumococcal Conjugate-13 09/01/2020    Pneumococcal Polysaccharide-23 08/29/2003, 06/30/2009   Unspecified SARS-COV-2 Vaccination 08/31/2020   Zoster Recombinat (Shingrix) 01/26/2006   Zoster, Live 01/29/2006   Zoster, Unspecified 01/26/2006   Pertinent  Health Maintenance Due  Topic Date Due   FOOT EXAM  Never done   OPHTHALMOLOGY EXAM  Never done   COLONOSCOPY (Pts 45-6yrs Insurance coverage will need to be confirmed)  Never done   HEMOGLOBIN A1C  03/05/2022   INFLUENZA VACCINE  Completed   DEXA SCAN  Discontinued      04/25/2020    8:00 AM 04/26/2020    1:00 AM 04/26/2020    9:00 PM 04/27/2020    9:00 AM 11/02/2021    4:20 PM  Fall Risk  Patient Fall Risk Level High fall risk High fall risk High fall risk High fall risk Moderate fall risk     Vitals:   11/16/21 1038  BP: 133/66  Pulse: 74  Resp: 19  Temp: (!) 97.3 F (36.3 C)  Height: 5\' 1"  (1.549 m)  Body mass index is 26.58 kg/m.  Physical Exam Constitutional:      General: She is not in acute distress. HENT:     Head: Normocephalic and atraumatic.     Nose: Nose normal.     Mouth/Throat:     Mouth: Mucous membranes are moist.  Eyes:     Conjunctiva/sclera: Conjunctivae normal.  Cardiovascular:     Rate and Rhythm: Normal rate and regular rhythm.  Pulmonary:     Effort: Pulmonary effort is normal.     Breath sounds: Normal breath sounds.  Abdominal:     General: Bowel sounds are normal.     Palpations: Abdomen is soft.     Comments: Jejunostomy tube   Skin:    General: Skin is warm and dry.  Neurological:     Mental Status: Mental status is at baseline.     Comments: Nonverbal Quadriplegic  Psychiatric:        Mood and Affect: Mood normal.       Labs reviewed: Recent Labs    03/24/21 0000 09/05/21 0000 11/02/21 2130  NA 143 143 135  K 4.2 4.4 4.5  CL 102 24* 102  CO2 24* 24* 21*  GLUCOSE  --   --  99  BUN 32* 30* 21  CREATININE 0.5 0.4* 0.36*  CALCIUM 10.0 10.1 9.9   Recent Labs    03/15/21 0000 09/05/21 0000  11/02/21 2130  AST 10* 11* 23  ALT 8 9 10   ALKPHOS 94 96 64  BILITOT  --   --  1.2  PROT  --   --  6.8  ALBUMIN 3.6 4.0 3.2*   Recent Labs    10/03/21 0000 10/04/21 0000 11/02/21 2130  WBC 12.5 12.8 12.7*  NEUTROABS 8.90  --  8.1*  HGB 12.4 12.5 13.2  HCT 38 41 40.7  MCV  --   --  91.1  PLT 290 322 370   Lab Results  Component Value Date   TSH 2.232 03/08/2020   Lab Results  Component Value Date   HGBA1C 6.6 09/05/2021   Lab Results  Component Value Date   CHOL 131 03/15/2021   HDL 37 03/15/2021   LDLCALC 57 03/15/2021   TRIG 186 (A) 03/15/2021   CHOLHDL 4.6 02/16/2020    Significant Diagnostic Results in last 30 days:  IR REPLACE G-TUBE SIMPLE WO FLUORO  Result Date: 11/03/2021 INDICATION: Gastrostomy tube inadvertently dislodged. Request for replacement of gastrostomy tube. Patient currently with 60 French Foley catheter in place EXAM: REPLACEMENT OF GASTROSTOMY TUBE MEDICATIONS: Viscous Lidocaine COMPLICATIONS: None immediate. PROCEDURE: A timeout was performed prior to the initiation of the procedure. Viscous lidocaine was used to locally anesthetize the tract. The Foley balloon was deflated and the catheter was easily removed. I then inserted a 16 French balloon retention gastrostomy tube and inflated the balloon with 7 mL water. Stomach contents were aspirated to confirm placement. IMPRESSION: Successful replacement of gastrostomy tube. Procedure performed by: Corrin Parker, PA-C Electronically Signed   By: Judie Petit.  Shick M.D.   On: 11/03/2021 10:32   DG ABDOMEN PEG TUBE LOCATION  Result Date: 11/02/2021 CLINICAL DATA:  Evaluate location of gastrostomy tube EXAM: ABDOMEN - 1 VIEW COMPARISON:  06/21/2021 FINDINGS: Contrast injected through percutaneous gastrostomy tube is noted within the lumen of stomach. There is no demonstrable extravasation. IMPRESSION: Tip of percutaneous gastrostomy tube is seen in the lumen of stomach. Electronically Signed   By: Ernie Avena  M.D.   On: 11/02/2021 20:46  Assessment/Plan  1. Essential (primary) hypertension -Blood pressure well controlled Continue current medications  2. History of CVA (cerebrovascular accident) -    Stable, continue Plavix and rosuvastatin  3. DM (diabetes mellitus), type 2 with peripheral vascular complications (HCC) Lab Results  Component Value Date   HGBA1C 6.6 09/05/2021   -   CBGs stable, continue metformin and Levemir -    Monitor CBGs  4. Oropharyngeal dysphagia -    Jejunostomy tube in back with ongoing Glucerna 1.2 called at 55 MLS/hour -    Aspiration precautions    Family/ staff Communication: Discussed plan of care with charge nurse.  Labs/tests ordered:   None    Kenard Gower, DNP, MSN, FNP-BC [facility] and [facility] [phone] (Monday-Friday 8:00 a.m. - 5:00 p.m.) [phone] (after hours)

## 2021-12-09 LAB — LIPID PANEL
Cholesterol: 121 (ref 0–200)
HDL: 44 (ref 35–70)
LDL Cholesterol: 40
LDl/HDL Ratio: 2.7
Triglycerides: 183 — AB (ref 40–160)

## 2021-12-12 ENCOUNTER — Encounter: Payer: Self-pay | Admitting: Adult Health

## 2021-12-12 ENCOUNTER — Non-Acute Institutional Stay (SKILLED_NURSING_FACILITY): Payer: Medicare Other | Admitting: Adult Health

## 2021-12-12 DIAGNOSIS — R1312 Dysphagia, oropharyngeal phase: Secondary | ICD-10-CM | POA: Diagnosis not present

## 2021-12-12 DIAGNOSIS — Z8673 Personal history of transient ischemic attack (TIA), and cerebral infarction without residual deficits: Secondary | ICD-10-CM

## 2021-12-12 DIAGNOSIS — E1151 Type 2 diabetes mellitus with diabetic peripheral angiopathy without gangrene: Secondary | ICD-10-CM

## 2021-12-12 DIAGNOSIS — I10 Essential (primary) hypertension: Secondary | ICD-10-CM | POA: Diagnosis not present

## 2021-12-12 NOTE — Progress Notes (Signed)
? ?Location:  [facility] ?Nursing Home Room Number: 226-A ?Place of Service:  SNF (31) ?Provider:  Kenard GowerMedina-Vargas, Amirra Herling, DNP, FNP-BC ? ?Patient Care Team: ?Pecola LawlessHopper, William F, MD as PCP - General (Internal Medicine) ?Medina-Vargas, Margit BandaMonina C, NP as Nurse Practitioner (Internal Medicine) ?[facility] ? ?Extended Emergency Contact Information ?Primary Emergency Contact: Montelongo, brad ?Mobile Phone: [phone] ?Relation: Son ?Secondary Emergency Contact: Ranae PalmsManley, Suzanne ?Mobile Phone: [phone] ?Relation: Daughter ? ?Code Status:  DNR ? ?Goals of care: Advanced Directive information ? ?  12/12/2021  ?  3:39 PM  ?Advanced Directives  ?Does Patient Have a Medical Advance Directive? Yes  ?Type of Advance Directive Out of facility DNR (pink MOST or yellow form)  ?Does patient want to make changes to medical advance directive? No - Patient declined  ?Pre-existing out of facility DNR order (yellow form or pink MOST form) Yellow form placed in chart (order not valid for inpatient use)  ? ? ? ?Chief Complaint  ?Patient presents with  ? Medical Management of Chronic Issues  ?  Routine Visit  ? ? ?HPI:  ?Pt is a 76 y.o. female seen today for medical management of chronic diseases.  She [facility]. She has a PMH of dyslipidemia, anxiety and essential hypertension. ? ?History of CVA (cerebrovascular accident) -  takes Plavix 75 mg 1 tab daily and rosuvastatin 5 mg 1 tab daily ? ?DM (diabetes mellitus), type 2 with peripheral vascular complications (HCC)  -  CBGs ranging from 150-189, with outlier 80 and 219.  She takes Levemir 18 units subcutaneously twice a day and metformin 500 mg 1 tab twice a day. ? ?Essential (primary) hypertension  -SBPs ranging from 112-132, with outlier 163 and 157.  She states metoprolol tartrate 25 mg twice a day, amlodipine 10 mg 1 tab daily and hydralazine 25 mg 1 tab every 8  hours ? ?Oropharyngeal dysphagia  -has J-tube feeding of Glucerna 1.2 call at 55 mL/hour ? ? ? ?Past Medical History:  ?Diagnosis Date  ? 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 Essential (primary) hypertension   ? History of falling   ? MI (myocardial infarction) (HCC)   ? Stroke [facility](HCC)   ? ?Past Surgical History:  ?Procedure Laterality Date  ? ABDOMINAL HYSTERECTOMY    ? APPENDECTOMY    ? BACK SURGERY    ? IR CM INJ ANY COLONIC TUBE W/FLUORO  09/27/2020  ? IR GASTROSTOMY TUBE MOD SED  03/31/2020  ? IR REPLACE G-TUBE SIMPLE WO FLUORO  06/21/2021  ? IR REPLACE G-TUBE SIMPLE WO FLUORO  11/03/2021  ? IR REPLC GASTRO/COLONIC TUBE PERCUT W/FLUORO  09/27/2021  ? ? ?Allergies  ?Allergen Reactions  ? Allopurinol Shortness Of Breath  ? Tetracycline Anaphylaxis  ?  She was placed on Doxycycline for truncal cellulitis in January 2022 w/o ADE & with improvement in the cellulitis..  ? Amoxicillin-Pot Clavulanate Diarrhea  ? Atorvastatin Other (See Comments)  ?  [place]  ? Azithromycin Other (See Comments)  ?  Caused blisters inside and out  ? Codeine Other (See Comments)  ?  Altered mental status  ? Lisinopril Other (See Comments)  ?  [place] - [place] [facility] [place] ?02/20/2020 she believes "reaction" was NP cough. No PMH of angioedema  ? Sulfa Antibiotics Diarrhea  ? ? ?Outpatient Encounter Medications as of 12/12/2021  ?Medication Sig  ? acetaminophen (  TYLENOL) 325 MG tablet Take 650 mg by mouth every 6 (six) hours as needed.  ? Albuterol Sulfate 2.5 MG/0.5ML NEBU Inhale into the lungs every 6 (six) hours as needed.  ? amLODipine (NORVASC) 10 MG tablet Place 10 mg into feeding tube daily.  ? barrier cream (NON-SPECIFIED) CREA Apply 1 application topically 2 (two) times daily as needed (for protection.).  ? bisacodyl (DULCOLAX) 10 MG suppository Place 10 mg rectally as needed.  ? chlorhexidine (PERIDEX) 0.12 % solution Use as  directed in the mouth or throat 2 (two) times daily.  ? clopidogrel (PLAVIX) 75 MG tablet Place 1 tablet (75 mg total) into feeding tube daily.  ? hydrALAZINE (APRESOLINE) 25 MG tablet Place 25 mg into feeding tube every 8 (eight) hours.  ? insulin detemir (LEVEMIR) 100 UNIT/ML injection Inject 18 Units into the skin 2 (two) times daily.  ? metformin (FORTAMET) 500 MG (OSM) 24 hr tablet Take 500 mg by mouth 2 (two) times daily with a meal. VIA G-TUBE FOR DM  ? metoprolol tartrate (LOPRESSOR) 25 MG tablet Place 25 mg into feeding tube 2 (two) times daily.  ? Nutritional Supplements (FEEDING SUPPLEMENT, GLUCERNA 1.2 CAL,) LIQD Place into feeding tube every 6 (six) hours. Place at 60 ml/hr via PEG. Water flush to 125 ml Q6h.  ? pantoprazole sodium (PROTONIX) 40 mg/20 mL PACK Place 20 mLs (40 mg total) into feeding tube daily.  ? polyethylene glycol (MIRALAX / GLYCOLAX) 17 g packet Place 17 g into feeding tube daily as needed for mild constipation.  ? rosuvastatin (CRESTOR) 5 MG tablet Place 1 tablet (5 mg total) into feeding tube daily.  ? Sodium Phosphates (RA SALINE ENEMA RE) Give disposable Saline Enema rectally X 1 dose/24 hrs as needed  ? urea (CARMOL) 20 % cream Apply topically. TO BE APPLIED BOTH FEET DAILY  ? ?No facility-administered encounter medications on file as of 12/12/2021.  ? ? ?Review of Systems   Unable to obtain due to nonverbal. ? ? ? ?Immunization History  ?Administered Date(s) Administered  ? Fluad Quad(high Dose 65+) 06/14/2021  ? Influenza, High Dose Seasonal PF 05/04/2016  ? Influenza-Unspecified 06/17/2020  ? PFIZER(Purple Top)SARS-COV-2 Vaccination 10/03/2019, 10/24/2019  ? Pneumococcal Conjugate-13 09/01/2020  ? Pneumococcal Polysaccharide-23 08/29/2003, 06/30/2009  ? Unspecified SARS-COV-2 Vaccination 08/31/2020  ? Zoster Recombinat (Shingrix) 01/26/2006  ? Zoster, Live 01/29/2006  ? Zoster, Unspecified 01/26/2006  ? ?Pertinent  Health Maintenance Due  ?Topic Date Due  ? FOOT EXAM  Never  done  ? OPHTHALMOLOGY EXAM  Never done  ? COLONOSCOPY (Pts 45-10yrs Insurance coverage will need to be confirmed)  Never done  ? HEMOGLOBIN A1C  03/05/2022  ? INFLUENZA VACCINE  03/28/2022  ? DEXA SCAN  Discontinued  ? ? ?  04/25/2020  ?  8:00 AM 04/26/2020  ?  1:00 AM 04/26/2020  ?  9:00 PM 04/27/2020  ?  9:00 AM 11/02/2021  ?  4:20 PM  ?Fall Risk  ?Patient Fall Risk Level High fall risk High fall risk High fall risk High fall risk Moderate fall risk  ? ? ? ?Vitals:  ? 12/12/21 1526  ?BP: (!) 163/58  ?Pulse: (!) 106  ?Resp: 20  ?Temp: 97.7 ?F (36.5 ?C)  ?Weight: 142 lb (64.4 kg)  ?Height: 5\' 1"  (1.549 m)  ? ?Body mass index is 26.83 kg/m?. ? ?Physical Exam ?Constitutional:   ?   General: She is not in acute distress. ?HENT:  ?   Head: Normocephalic and atraumatic.  ?  Nose: Nose normal.  ?   Mouth/Throat:  ?   Mouth: Mucous membranes are moist.  ?Eyes:  ?   Conjunctiva/sclera: Conjunctivae normal.  ?Cardiovascular:  ?   Rate and Rhythm: Normal rate and regular rhythm.  ?Pulmonary:  ?   Effort: Pulmonary effort is normal.  ?   Breath sounds: Normal breath sounds.  ?Abdominal:  ?   General: Bowel sounds are normal.  ?   Palpations: Abdomen is soft.  ?   Comments: Has J-tube  ?Skin: ?   General: Skin is warm and dry.  ?Neurological:  ?   Comments: Unable to move all 4 extremities, nonverbal.  ?Psychiatric:     ?   Mood and Affect: Mood normal.     ?   Behavior: Behavior normal.  ?  ? ? ? ?Labs reviewed: ?Recent Labs  ?  09/05/21 ?0000 10/24/21 ?0000 11/02/21 ?2130  ?NA 143 135* 135  ?K 4.4 4.0 4.5  ?CL 24* 99 102  ?CO2 24* 24* 21*  ?GLUCOSE  --   --  99  ?BUN 30* 23* 21  ?CREATININE 0.4* 0.4* 0.36*  ?CALCIUM 10.1 9.3 9.9  ? ?Recent Labs  ?  09/05/21 ?0000 10/24/21 ?0000 11/02/21 ?2130  ?AST 11* 13 23  ?ALT 9 11 10   ?ALKPHOS 96 66 64  ?BILITOT  --   --  1.2  ?PROT  --   --  6.8  ?ALBUMIN 4.0 3.5 3.2*  ? ?Recent Labs  ?  10/03/21 ?0000 10/04/21 ?0000 11/02/21 ?2130  ?WBC 12.5 12.8 12.7*  ?NEUTROABS 8.90  --  8.1*  ?HGB 12.4  12.5 13.2  ?HCT 38 41 40.7  ?MCV  --   --  91.1  ?PLT 290 322 370  ? ?Lab Results  ?Component Value Date  ? TSH 2.232 03/08/2020  ? ?Lab Results  ?Component Value Date  ? HGBA1C 6.6 09/05/2021  ? ?Lab Results  ?Component

## 2021-12-22 LAB — BASIC METABOLIC PANEL
BUN: 22 — AB (ref 4–21)
CO2: 25 — AB (ref 13–22)
Chloride: 102 (ref 99–108)
Creatinine: 0.4 — AB (ref 0.5–1.1)
Glucose: 183
Potassium: 4.3 mEq/L (ref 3.5–5.1)
Sodium: 142 (ref 137–147)

## 2021-12-22 LAB — CBC AND DIFFERENTIAL
HCT: 36 (ref 36–46)
Hemoglobin: 11.6 — AB (ref 12.0–16.0)
Platelets: 288 10*3/uL (ref 150–400)
WBC: 14.9

## 2021-12-22 LAB — COMPREHENSIVE METABOLIC PANEL
Albumin: 3.7 (ref 3.5–5.0)
Calcium: 10.3 (ref 8.7–10.7)
Globulin: 2.6

## 2021-12-22 LAB — CBC: RBC: 4.05 (ref 3.87–5.11)

## 2021-12-22 LAB — HEPATIC FUNCTION PANEL
ALT: 8 U/L (ref 7–35)
AST: 9 — AB (ref 13–35)
Alkaline Phosphatase: 86 (ref 25–125)

## 2022-01-05 ENCOUNTER — Non-Acute Institutional Stay (SKILLED_NURSING_FACILITY): Payer: Medicare Other | Admitting: Internal Medicine

## 2022-01-05 ENCOUNTER — Encounter: Payer: Self-pay | Admitting: Internal Medicine

## 2022-01-05 DIAGNOSIS — Z1612 Extended spectrum beta lactamase (ESBL) resistance: Secondary | ICD-10-CM | POA: Diagnosis not present

## 2022-01-05 DIAGNOSIS — N390 Urinary tract infection, site not specified: Secondary | ICD-10-CM

## 2022-01-05 DIAGNOSIS — B9629 Other Escherichia coli [E. coli] as the cause of diseases classified elsewhere: Secondary | ICD-10-CM

## 2022-01-05 DIAGNOSIS — G4733 Obstructive sleep apnea (adult) (pediatric): Secondary | ICD-10-CM | POA: Diagnosis not present

## 2022-01-05 NOTE — Progress Notes (Signed)
? ?  NURSING HOME LOCATION:  [facility] ?ROOM NUMBER:  226 A ? ?CODE STATUS:  DNR ? ?PCP:  Kenard Gower NP,PSC ? ?This is a nursing facility follow up visit for specific acute issue of lip lesion noted by staff, for which evaluation requested. ? ?Interim medical record and care since last SNF visit was updated with review of diagnostic studies and change in clinical status since last visit were documented. ? 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 ?Staff was concerned about possible bruises of the corner of the mouth on the right. These were described as 2-3 cm long. ?Additionally urine culture had been ordered which documents ESBL E. coli resistant to majority of antibiotics with exclusion of ertapenem, gentamicin, imipenem, nitrofurantoin, and tobramycin. ? ?Review of systems: As noted the patient is nonverbal.  During the exam she did open her eyes but did not focus on the examiner as if looking past me.  She follows no commands and issued no discernible vocalization but only rare groaning & grunting type sounds. ? ?Physical exam:  ?Pertinent or positive findings: As noted she was initially asleep and was exhibiting hypopnea, brief apnea, and snoring.  When examined she did open her eyes but again did not focus.  The lips are coated with dried orange colored secretions which were also collected in the oral airway clinically impacting the airway.No lip ecchymoses appreciated. Breath sounds are decreased.  Second heart sound is increased.  G-tube is present.  Upper extremities are in braces.  Posterior tibial pulses are stronger than dorsalis pedis pulses.  She has a tattoo at the right ankle.  There is no voluntary movement in the upper or lower extremities. ? ?General appearance: no acute  distress, increased work of breathing is present.   ?Lymphatic: No lymphadenopathy about the head, neck, axilla. ?Eyes: No conjunctival inflammation or lid edema is present. There is no scleral icterus. ?Ears:  External ear exam shows no significant lesions or deformities.   ?Nose:  External nasal examination shows no deformity or inflammation. Nasal mucosa are pink and moist without lesions, exudates ?Neck:  No thyromegaly, masses, tenderness noted.    ?Heart:  No gallop, murmur, click, rub .  ?Lungs:  without wheezes, rhonchi, rales, rubs. ?Abdomen: Bowel sounds are normal. Abdomen is soft and nontender with no organomegaly, hernias, masses. ?GU: Deferred  ?Extremities:  No cyanosis, clubbing, edema  ?Skin: Warm & dry w/o tenting. ?No significant lesions or rash. ? ?See summary under each active problem in the Problem List with associated updated therapeutic plan ? ? ?

## 2022-01-05 NOTE — Patient Instructions (Signed)
See assessment and plan under each diagnosis in the problem list and acutely for this visit 

## 2022-01-05 NOTE — Assessment & Plan Note (Addendum)
Nitrofurantoin 100 mg twice daily x5 days via the G-tube has been ordered.  This is a multi resistant organism that would require parenteral, potentially nephrotoxic medications if she fails to respond to nitrofurantoin. It also may actually be a colonizer. ?

## 2022-01-05 NOTE — Assessment & Plan Note (Addendum)
Suctioning each shift as needed of the oral airway to prevent airway compromise  was ordered.  Vaseline will be applied to the lips each shift. ?She is not a CPAP candidate & present DNR status is merciful. ?

## 2022-01-11 ENCOUNTER — Non-Acute Institutional Stay (SKILLED_NURSING_FACILITY): Payer: Medicare Other | Admitting: Adult Health

## 2022-01-11 ENCOUNTER — Encounter: Payer: Self-pay | Admitting: Adult Health

## 2022-01-11 DIAGNOSIS — I10 Essential (primary) hypertension: Secondary | ICD-10-CM

## 2022-01-11 DIAGNOSIS — Z7189 Other specified counseling: Secondary | ICD-10-CM | POA: Diagnosis not present

## 2022-01-11 DIAGNOSIS — R1312 Dysphagia, oropharyngeal phase: Secondary | ICD-10-CM

## 2022-01-11 DIAGNOSIS — E1151 Type 2 diabetes mellitus with diabetic peripheral angiopathy without gangrene: Secondary | ICD-10-CM

## 2022-01-11 DIAGNOSIS — Z8673 Personal history of transient ischemic attack (TIA), and cerebral infarction without residual deficits: Secondary | ICD-10-CM | POA: Diagnosis not present

## 2022-01-11 NOTE — Progress Notes (Signed)
Location:  [facility] Room Number: 226-A Place of Service:  SNF (31) Provider:  Kenard GowerMedina-Vargas, Lilo [place], DNP, FNP-BC  Patient Care Team: Pecola LawlessHopper, William F, MD as PCP - General (Internal Medicine) Medina-Vargas, Margit BandaMonina C, NP as Nurse Practitioner (Internal Medicine) Re[facility], [facility]  Extended Emergency Contact Information Primary Emergency Contact: Hanley Seamenmanley, brad Mobile Phone: [phone] Relation: Son Secondary Emergency Contact: Ranae PalmsManley, Suzanne Mobile Phone: [phone] Relation: Daughter  Code Status:  DNR  Goals of care: Advanced Directive information    01/11/2022    1:39 PM  Advanced Directives  Does Patient Have a Medical Advance Directive? Yes  Type of Advance Directive Out of facility DNR (pink MOST or yellow form)  Does patient want to make changes to medical advance directive? No - Patient declined  Pre-existing out of facility DNR order (yellow form or pink MOST form) Yellow form placed in chart (order not valid for inpatient use)     Chief Complaint  Patient presents with   Acute Visit    Care plan meeting    HPI:  Pt is a 76 y.o. female who had a care plan meeting today attended by social worker, NP, dietitian and son, Nida BoatmanBrad, who attended via telephone conference. She is a [facility]. She remains to be DNR and followed by palliative care. Discussed medications, vital signs and weights. Latest weight is 143.6 lbs, stable. She one-on-one visit for her activities. The meeting lasted for 20 minutes.    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)    Essential (primary) hypertension    History of falling    MI (myocardial infarction) (HCC)    Stroke [facility](HCC)    Past Surgical History:  Procedure Laterality Date   ABDOMINAL HYSTERECTOMY     APPENDECTOMY     BACK SURGERY     IR CM INJ ANY  COLONIC TUBE W/FLUORO  09/27/2020   IR GASTROSTOMY TUBE MOD SED  03/31/2020   IR REPLACE G-TUBE SIMPLE WO FLUORO  06/21/2021   IR REPLACE G-TUBE SIMPLE WO FLUORO  11/03/2021   IR REPLC GASTRO/COLONIC TUBE PERCUT W/FLUORO  09/27/2021    Allergies  Allergen Reactions   Allopurinol Shortness Of Breath   Tetracycline Anaphylaxis    She was placed on Doxycycline for truncal cellulitis in January 2022 w/o ADE & with improvement in the cellulitis..   Amoxicillin-Pot Clavulanate Diarrhea   Atorvastatin Other (See Comments)    [facility] - [place] [facility] [facility] 05/10/[place]   Azithromycin Other (See Comments)    Caused blisters inside and out   Codeine Other (See Comments)    Altered mental status   Lisinopril Other (See Comments)    [facility] [place] [facility] 12/23/2008 02/20/2020 she believes "reaction" was NP cough. No PMH of angioedema   Sulfa Antibiotics Diarrhea   Sulfamethoprim [Sulfamethoxazole-Trimethoprim] Diarrhea    Outpatient Encounter Medications as of 01/11/2022  Medication Sig   acetaminophen (TYLENOL) 325 MG tablet Take 650 mg by mouth every 6 (six) hours as needed.   Albuterol Sulfate 2.5 MG/0.5ML NEBU Inhale into the lungs every 6 (six) hours as needed.   amLODipine (NORVASC) 10 MG tablet Place 10 mg into feeding tube daily.   barrier cream (NON-SPECIFIED) CREA Apply 1 application topically 2 (two) times daily as needed (for protection.).   bisacodyl (DULCOLAX) 10 MG suppository Place 10 mg rectally as needed.  chlorhexidine (PERIDEX) 0.12 % solution Use as directed in the mouth or throat 2 (two) times daily.   clopidogrel (PLAVIX) 75 MG tablet Place 1 tablet (75 mg total) into feeding tube daily.   erythromycin ophthalmic ointment Place 1 application. into both eyes in the morning and at bedtime. FOR DRY EYE/INCOMPLETE BLINK   hydrALAZINE (APRESOLINE) 25 MG tablet Place 25 mg into feeding tube every 8 (eight) hours.   insulin detemir  (LEVEMIR) 100 UNIT/ML injection Inject 18 Units into the skin 2 (two) times daily.   Magnesium Hydroxide (MILK OF MAGNESIA PO) Take by mouth as needed.   metformin (FORTAMET) 500 MG (OSM) 24 hr tablet Take 500 mg by mouth 2 (two) times daily with a meal. VIA G-TUBE FOR DM   metoprolol tartrate (LOPRESSOR) 25 MG tablet Place 25 mg into feeding tube 2 (two) times daily.   Nutritional Supplements (FEEDING SUPPLEMENT, GLUCERNA 1.2 CAL,) LIQD Place into feeding tube every 6 (six) hours. Place at 60 ml/hr via PEG. Water flush to 125 ml Q6h.   pantoprazole sodium (PROTONIX) 40 mg/20 mL PACK Place 20 mLs (40 mg total) into feeding tube daily.   polyethylene glycol (MIRALAX / GLYCOLAX) 17 g packet Place 17 g into feeding tube daily as needed for mild constipation.   rosuvastatin (CRESTOR) 5 MG tablet Place 1 tablet (5 mg total) into feeding tube daily.   Sodium Phosphates (RA SALINE ENEMA RE) Give disposable Saline Enema rectally X 1 dose/24 hrs as needed   urea (CARMOL) 20 % cream Apply topically. TO BE APPLIED BOTH FEET DAILY   White Petrolatum (WHITE PETROLEUM JELLY EX) VASELINE WHITE PETROLEUM JELLY APPLY TO LIPS EVERY SHIFT TO PREVENT DRYING.   No facility-administered encounter medications on file as of 01/11/2022.    Review of Systems   Unable to obtain due to aphasia.    Immunization History  Administered Date(s) Administered   Fluad Quad(high Dose 65+) 06/14/2021   Influenza, High Dose Seasonal PF 05/04/2016   Influenza-Unspecified 06/17/2020   PFIZER(Purple Top)SARS-COV-2 Vaccination 10/03/2019, 10/24/2019   Pneumococcal Conjugate-13 09/01/2020   Pneumococcal Polysaccharide-23 08/29/2003, 06/30/2009   Unspecified SARS-COV-2 Vaccination 08/31/2020   Zoster Recombinat (Shingrix) 01/26/2006   Zoster, Live 01/29/2006   Zoster, Unspecified 01/26/2006   Pertinent  Health Maintenance Due  Topic Date Due   FOOT EXAM  Never done   OPHTHALMOLOGY EXAM  Never done   COLONOSCOPY (Pts 45-23yrs  Insurance coverage will need to be confirmed)  Never done   HEMOGLOBIN A1C  03/05/2022   INFLUENZA VACCINE  03/28/2022   DEXA SCAN  Discontinued      04/25/2020    8:00 AM 04/26/2020    1:00 AM 04/26/2020    9:00 PM 04/27/2020    9:00 AM 11/02/2021    4:20 PM  Fall Risk  Patient Fall Risk Level High fall risk High fall risk High fall risk High fall risk Moderate fall risk     Vitals:   01/11/22 1349  BP: 126/68  Pulse: 78  Resp: 18  Temp: (!) 97.5 F (36.4 C)  Weight: 143 lb 9.6 oz (65.1 kg)  Height: 5\' 1"  (1.549 m)   Body mass index is 27.13 kg/m.  Physical Exam Constitutional:      General: She is not in acute distress. HENT:     Head: Normocephalic and atraumatic.     Nose: Nose normal.     Mouth/Throat:     Mouth: Mucous membranes are moist.  Eyes:     Conjunctiva/sclera:  Conjunctivae normal.  Cardiovascular:     Rate and Rhythm: Normal rate and regular rhythm.  Pulmonary:     Effort: Pulmonary effort is normal.     Breath sounds: Normal breath sounds.  Abdominal:     General: Bowel sounds are normal.     Palpations: Abdomen is soft.     Comments: Has tube feeding  Skin:    General: Skin is warm and dry.  Neurological:     Comments: Unable to move all 4 extremities. Aphasic.  Psychiatric:        Mood and Affect: Mood normal.        Behavior: Behavior normal.       Labs reviewed: Recent Labs    10/24/21 0000 11/02/21 2130 12/22/21 0000  NA 135* 135 142  K 4.0 4.5 4.3  CL 99 102 102  CO2 24* 21* 25*  GLUCOSE  --  99  --   BUN 23* 21 22*  CREATININE 0.4* 0.36* 0.4*  CALCIUM 9.3 9.9 10.3   Recent Labs    10/24/21 0000 11/02/21 2130 12/22/21 0000  AST 13 23 9*  ALT 11 10 8   ALKPHOS 66 64 86  BILITOT  --  1.2  --   PROT  --  6.8  --   ALBUMIN 3.5 3.2* 3.7   Recent Labs    10/03/21 0000 10/04/21 0000 11/02/21 2130 12/22/21 0000  WBC 12.5 12.8 12.7* 14.9  NEUTROABS 8.90  --  8.1*  --   HGB 12.4 12.5 13.2 11.6*  HCT 38 41 40.7 36   MCV  --   --  91.1  --   PLT 290 322 370 288   Lab Results  Component Value Date   TSH 2.232 03/08/2020   Lab Results  Component Value Date   HGBA1C 6.6 09/05/2021   Lab Results  Component Value Date   CHOL 121 12/09/2021   HDL 44 12/09/2021   LDLCALC 40 12/09/2021   TRIG 183 (A) 12/09/2021   CHOLHDL 4.6 02/16/2020    Significant Diagnostic Results in last 30 days:  No results found.  Assessment/Plan  1. ACP (advance care planning) -  remains to be DNR -   discussed medications, vital signs and weights -  remains to have palliative care  2. History of CVA (cerebrovascular accident) -  stable, continue Plavix and Rosuvastatin  3. DM (diabetes mellitus), type 2 with peripheral vascular complications (HCC) Lab Results  Component Value Date   HGBA1C 6.6 09/05/2021   -  stable, continue Metformin  4. Essential (primary) hypertension -Blood pressure well controlled Continue current medications  5. Oropharyngeal dysphagia -  continue peg tube feedings of Glucerna 1.2 cal at 55 ml/h -   aspiration precautions    Family/ staff Communication: Discussed plan of care with son and IDT.  Labs/tests ordered:   None    11/03/2021, DNP, MSN, FNP-BC [facility] [phone] (Monday-Friday 8:00 a.m. - 5:00 p.m.) [phone] (after hours)

## 2022-01-23 ENCOUNTER — Non-Acute Institutional Stay: Payer: Medicare Other | Admitting: Hospice

## 2022-01-23 DIAGNOSIS — R5381 Other malaise: Secondary | ICD-10-CM

## 2022-01-23 DIAGNOSIS — R1312 Dysphagia, oropharyngeal phase: Secondary | ICD-10-CM

## 2022-01-23 DIAGNOSIS — Z515 Encounter for palliative care: Secondary | ICD-10-CM

## 2022-01-23 DIAGNOSIS — F0390 Unspecified dementia without behavioral disturbance: Secondary | ICD-10-CM

## 2022-01-23 NOTE — Progress Notes (Signed)
Therapist, nutritional Palliative Care Consult Note Telephone: [phone]  Fax: [phone]  PATIENT NAME: Tasha [name] DOB: 14-Jun-1946 MRN: [id number]  PRIMARY CARE PROVIDER:   Pecola Lawless, MD Pecola Lawless, MD [address] [place],  [place] [address]  REFERRING PROVIDER: Pecola Lawless, MD Pecola Lawless, MD [address] [place],  [place] [address]  RESPONSIBLE PARTY:  Brad Primary Emergency Contact: vernice, mannina Mobile Phone: [phone] Relation: Son Secondary Emergency Contact: Merit, Gadsby Mobile Phone: [phone] Relation: Daughter Contact Information     Name Relation Home Work [name], [name] Son   [phone]   Marlynn, [place] Daughter   [phone]       Visit is to build trust and highlight Palliative Medicine as specialized medical care for people living with serious illness, aimed at facilitating better quality of life through symptoms relief, assisting with advance care planning and complex medical decision making. This is a follow up visit.  NP called Brad and updated him on visit for which he expressed appreciation.  RECOMMENDATIONS/PLAN:   Advance Care Planning/Code Status: Patient is a DO NOT RESUSCITATE  Goals of Care: Goals of care include to maximize quality of life and symptom management.  NP broached hospice service with [name]; family is not interested in hospice service.  Palliative care team will continue to support patient, patient's family, and medical team.  Symptom management/Plan:  Physical debility: Patient is minimally interactive, quadriplegic,  bed-bound, non verbal, nothing by mouth, nutrition via peg tube. Total care. Continue passive ROM restorative exercises Dysphagia: PEG tube feeding Glucerna 1.2 Cal at 55 mL/h is ongoing.  Aspiration precautions in place. Dementia: Fast 7D. Incontinent of bowel and bladder, minimally interactive. Continue ongoing supportive care.  Patient will benefit  from hospice service when family is ready for it.   Follow up: Palliative care will continue to follow for complex medical decision making, advance care planning, and clarification of goals. Return 6 weeks or prn. Encouraged to call provider sooner with any concerns.  CHIEF COMPLAINT: Palliative follow up  HISTORY OF PRESENT ILLNESS:  Tasha [name] a 76 y.o. 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 History of  hypertension, type 2 diabetes mellitus. History obtained from review of EMR, discussion with primary team, family and/or patient. Records reviewed and summarized above. All 10 point systems reviewed and are negative except as documented in history of present illness above  Review and summarization of Epic records shows history from other than patient.   Palliative Care was asked to follow this patient o help address complex decision making in the context of advance care planning and goals of care clarification.   PHYSICAL EXAM  General: In no acute distress, appropriately dressed, FLACC 0 Cardiovascular: regular rate and rhythm; no edema in BLE Pulmonary: no cough, no increased work of breathing, normal respiratory effort Abdomen: soft, non tender, no guarding, positive bowel sounds in all quadrants, PEG tube present GU:  no suprapubic tenderness Eyes: Normal lids, no discharge ENMT: Moist mucous membranes Musculoskeletal:  weakness, bedbound, not able to move extremities Skin: no rash to visible skin, warm without cyanosis,  Psych: non-anxious affect Neurological: Weakness but otherwise non focal, nonverbal during visit Heme/lymph/immuno: no bruises, no bleeding  PERTINENT MEDICATIONS:  Outpatient Encounter Medications as of 01/23/2022  Medication Sig   acetaminophen (TYLENOL) 325 MG tablet Take 650 mg by mouth every 6 (six) hours as needed.   Albuterol Sulfate 2.5  MG/0.5ML NEBU Inhale into the lungs every 6 (six) hours  as needed.   amLODipine (NORVASC) 10 MG tablet Place 10 mg into feeding tube daily.   barrier cream (NON-SPECIFIED) CREA Apply 1 application topically 2 (two) times daily as needed (for protection.).   bisacodyl (DULCOLAX) 10 MG suppository Place 10 mg rectally as needed.   chlorhexidine (PERIDEX) 0.12 % solution Use as directed in the mouth or throat 2 (two) times daily.   clopidogrel (PLAVIX) 75 MG tablet Place 1 tablet (75 mg total) into feeding tube daily.   erythromycin ophthalmic ointment Place 1 application. into both eyes in the morning and at bedtime. FOR DRY EYE/INCOMPLETE BLINK   hydrALAZINE (APRESOLINE) 25 MG tablet Place 25 mg into feeding tube every 8 (eight) hours.   insulin detemir (LEVEMIR) 100 UNIT/ML injection Inject 18 Units into the skin 2 (two) times daily.   Magnesium Hydroxide (MILK OF MAGNESIA PO) Take by mouth as needed.   metformin (FORTAMET) 500 MG (OSM) 24 hr tablet Take 500 mg by mouth 2 (two) times daily with a meal. VIA G-TUBE FOR DM   metoprolol tartrate (LOPRESSOR) 25 MG tablet Place 25 mg into feeding tube 2 (two) times daily.   Nutritional Supplements (FEEDING SUPPLEMENT, GLUCERNA 1.2 CAL,) LIQD Place into feeding tube every 6 (six) hours. Place at 60 ml/hr via PEG. Water flush to 125 ml Q6h.   pantoprazole sodium (PROTONIX) 40 mg/20 mL PACK Place 20 mLs (40 mg total) into feeding tube daily.   polyethylene glycol (MIRALAX / GLYCOLAX) 17 g packet Place 17 g into feeding tube daily as needed for mild constipation.   rosuvastatin (CRESTOR) 5 MG tablet Place 1 tablet (5 mg total) into feeding tube daily.   Sodium Phosphates (RA SALINE ENEMA RE) Give disposable Saline Enema rectally X 1 dose/24 hrs as needed   urea (CARMOL) 20 % cream Apply topically. TO BE APPLIED BOTH FEET DAILY   White Petrolatum (WHITE PETROLEUM JELLY EX) VASELINE WHITE PETROLEUM JELLY APPLY TO LIPS EVERY SHIFT TO PREVENT DRYING.   No facility-administered encounter medications on file as of  01/23/2022.    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   Amoxicillin-Pot Clavulanate Diarrhea   Atorvastatin Other (See Comments)    Unknown reaction - reported by [facility] [place] [facility] 05/10/2012   Azithromycin Other (See Comments)    Caused blisters inside and out   Codeine Other (See Comments)    Altered mental status   Lisinopril Other (See Comments)    Unknown reaction - reported by [facility] [place] clinic 12/23/2008 02/20/2020 she believes "reaction" was NP cough. No PMH of angioedema   Sulfa Antibiotics Diarrhea   Sulfamethoprim [Sulfamethoxazole-Trimethoprim] Diarrhea      I spent 45 minutes providing this consultation; this includes time spent with patient/family, chart review and documentation. More than 50% of the time in this consultation was spent on counseling and coordinating communication   Thank you for the opportunity to participate in the care of Klani Caridi Please call our office at  [phone] if we can be of additional assistance.  Note: Portions of this note were generated with L[employer]. Dictation errors may occur despite best attempts at proofreading.  Teodoro Spray, NP

## 2022-02-27 ENCOUNTER — Encounter (HOSPITAL_COMMUNITY): Payer: Self-pay | Admitting: Emergency Medicine

## 2022-02-27 ENCOUNTER — Emergency Department (HOSPITAL_COMMUNITY)
Admission: EM | Admit: 2022-02-27 | Discharge: 2022-02-28 | Disposition: A | Discharge status: Home / Self Care | Payer: Medicare Other | Attending: Emergency Medicine | Admitting: Emergency Medicine

## 2022-02-27 ENCOUNTER — Other Ambulatory Visit: Payer: Self-pay

## 2022-02-27 DIAGNOSIS — E1149 Type 2 diabetes mellitus with other diabetic neurological complication: Secondary | ICD-10-CM | POA: Insufficient documentation

## 2022-02-27 DIAGNOSIS — Z79899 Other long term (current) drug therapy: Secondary | ICD-10-CM | POA: Insufficient documentation

## 2022-02-27 DIAGNOSIS — Z794 Long term (current) use of insulin: Secondary | ICD-10-CM | POA: Diagnosis not present

## 2022-02-27 DIAGNOSIS — K9423 Gastrostomy malfunction: Secondary | ICD-10-CM | POA: Insufficient documentation

## 2022-02-27 DIAGNOSIS — Z7984 Long term (current) use of oral hypoglycemic drugs: Secondary | ICD-10-CM | POA: Insufficient documentation

## 2022-02-27 DIAGNOSIS — T85528A Displacement of other gastrointestinal prosthetic devices, implants and grafts, initial encounter: Secondary | ICD-10-CM

## 2022-02-27 DIAGNOSIS — I10 Essential (primary) hypertension: Secondary | ICD-10-CM | POA: Diagnosis not present

## 2022-02-27 NOTE — ED Triage Notes (Signed)
Pt brought to ED by [facility] triage and placed on stretcher after feeding tube dislodged at nursing facility. Pt is alert to baseline from prior CVA per EMS staff.

## 2022-02-28 ENCOUNTER — Emergency Department (HOSPITAL_COMMUNITY): Payer: Medicare Other

## 2022-02-28 DIAGNOSIS — K9423 Gastrostomy malfunction: Secondary | ICD-10-CM | POA: Diagnosis not present

## 2022-02-28 MED ORDER — DIATRIZOATE MEGLUMINE & SODIUM 66-10 % PO SOLN
ORAL | Status: AC
  Filled 2022-02-28: qty 30

## 2022-02-28 NOTE — ED Notes (Signed)
[facility] called to transport pt to [facility] facility

## 2022-02-28 NOTE — ED Provider Notes (Signed)
[facility] EMERGENCY DEPARTMENT Provider Note   CSN: [id number] Arrival date & time: 02/27/22  2354     History  No chief complaint on file.   Tasha [name] is a 76 y.o. female.  HPI     This is a 76 year old female who presents to the ED via EMS after her G-tube dislodged.  Patient is nonverbal at baseline per EMS report.  I do not have any available collateral information.  Patient is nonverbal.  Level 5 caveat  Chart reviewed.  Last G-tube replacement by IR with a 16 French G-tube in March 2023.  Previously had a 20 [place] G-tube.  Home Medications Prior to Admission medications   Medication Sig Start Date End Date Taking? Authorizing Provider  acetaminophen (TYLENOL) 325 MG tablet Take 650 mg by mouth every 6 (six) hours as needed.    [provider]  Albuterol Sulfate 2.5 MG/0.5ML NEBU Inhale into the lungs every 6 (six) hours as needed.    [provider]  amLODipine (NORVASC) 10 MG tablet Place 10 mg into feeding tube daily.    [provider]  barrier cream (NON-SPECIFIED) CREA Apply 1 application topically 2 (two) times daily as needed (for protection.).    [provider]  bisacodyl (DULCOLAX) 10 MG suppository Place 10 mg rectally as needed.    [provider]  chlorhexidine (PERIDEX) 0.12 % solution Use as directed in the mouth or throat 2 (two) times daily.    [provider]  clopidogrel (PLAVIX) 75 MG tablet Place 1 tablet (75 mg total) into feeding tube daily. 04/27/20   Kathlen Mody, MD  erythromycin ophthalmic ointment Place 1 application. into both eyes in the morning and at bedtime. FOR DRY EYE/INCOMPLETE BLINK    [provider]  hydrALAZINE (APRESOLINE) 25 MG tablet Place 25 mg into feeding tube every 8 (eight) hours.    [provider]  insulin detemir (LEVEMIR) 100 UNIT/ML injection Inject 18 Units into the skin 2 (two) times daily.    [provider]   Magnesium Hydroxide (MILK OF MAGNESIA PO) Take by mouth as needed.    [provider]  metformin (FORTAMET) 500 MG (OSM) 24 hr tablet Take 500 mg by mouth 2 (two) times daily with a meal. VIA G-TUBE FOR DM    [provider]  metoprolol tartrate (LOPRESSOR) 25 MG tablet Place 25 mg into feeding tube 2 (two) times daily.    [provider]  Nutritional Supplements (FEEDING SUPPLEMENT, GLUCERNA 1.2 CAL,) LIQD Place into feeding tube every 6 (six) hours. Place at 60 ml/hr via PEG. Water flush to 125 ml Q6h.    [provider]  pantoprazole sodium (PROTONIX) 40 mg/20 mL PACK Place 20 mLs (40 mg total) into feeding tube daily. 04/27/20   Kathlen Mody, MD  polyethylene glycol (MIRALAX / GLYCOLAX) 17 g packet Place 17 g into feeding tube daily as needed for mild constipation. 04/27/20   Kathlen Mody, MD  rosuvastatin (CRESTOR) 5 MG tablet Place 1 tablet (5 mg total) into feeding tube daily. 04/27/20   Kathlen Mody, MD  Sodium Phosphates (RA SALINE ENEMA RE) Give disposable Saline Enema rectally X 1 dose/24 hrs as needed    [provider]  urea (CARMOL) 20 % cream Apply topically. TO BE APPLIED BOTH FEET DAILY    [provider]  White Petrolatum (WHITE PETROLEUM JELLY EX) VASELINE WHITE PETROLEUM JELLY APPLY TO LIPS EVERY SHIFT TO PREVENT DRYING.    [provider]      Allergies    Allopurinol, Tetracycline, Amoxicillin-pot clavulanate, Atorvastatin, Azithromycin, Codeine, Lisinopril, Sulfa antibiotics, and Sulfamethoprim [sulfamethoxazole-trimethoprim]    Review of Systems   Review of Systems  Unable to perform ROS: Patient nonverbal    Physical Exam Updated Vital Signs BP (!) 142/73   Pulse 72   Temp 98.3 F (36.8 C) (Oral)   Resp 17   SpO2 98%  Physical Exam Vitals and nursing note reviewed.  Constitutional:      Appearance: She is well-developed.     Comments: Chronically ill-appearing  HENT:     Head: Normocephalic.      Mouth/Throat:     Mouth: Mucous membranes are moist.  Eyes:     Pupils: Pupils are equal, round, and reactive to light.  Cardiovascular:     Rate and Rhythm: Normal rate and regular rhythm.     Heart sounds: Normal heart sounds.  Pulmonary:     Effort: Pulmonary effort is normal. No respiratory distress.     Breath sounds: No wheezing.  Abdominal:     General: Bowel sounds are normal.     Palpations: Abdomen is soft.     Tenderness: There is no abdominal tenderness. There is no guarding or rebound.     Comments: G-tube site noted in the left upper abdomen, granulation tissue noted, no adjacent erythema or drainage  Musculoskeletal:     Cervical back: Neck supple.     Comments: Contracture of bilateral upper extremities  Skin:    General: Skin is warm and dry.  Neurological:     Mental Status: She is alert.     Comments: Unable to assess orientation, left facial droop noted, contractured extremities  Psychiatric:     Comments: Unable to assess     ED Results / Procedures / Treatments   Labs (all labs ordered are listed, but only abnormal results are displayed) Labs Reviewed - No data to display  EKG None  Radiology DG ABDOMEN PEG TUBE LOCATION  Result Date: 02/28/2022 CLINICAL DATA:  Check gastrostomy replacement EXAM: ABDOMEN - 1 VIEW COMPARISON:  11/02/2021 FINDINGS: Recently placed gastrostomy catheter was injected with contrast material. Contrast flows freely into the stomach and subsequently into the duodenum. 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 No other focal abnormality is noted. IMPRESSION: Gastrostomy catheter lies within the stomach. Electronically Signed   By: Alcide Clever M.D.   On: 02/28/2022 03:15    Procedures Gastrostomy tube replacement  Date/Time: 02/28/2022 12:55 AM  Performed by: Shon Baton, MD Authorized by: Shon Baton, MD  Consent: The procedure was performed in an emergent situation. Verbal consent not obtained. Patient identity  confirmed: arm band Time out: Immediately prior to procedure a "time out" was called to verify the correct patient, procedure, equipment, support staff and site/side marked as required. Local anesthesia used: no  Anesthesia: Local anesthesia used: no  Sedation: Patient sedated: no  Patient tolerance: patient tolerated the procedure well with no immediate complications Comments: 63 French Foley catheter tube was placed as a Print production planner until 80 [place] G-tube could be obtained.  Ultimately, 29 French G-tube was placed without difficulty.  X-ray shows good placement.       Medications Ordered in ED Medications  diatrizoate meglumine-sodium (GASTROGRAFIN) 66-10 % solution (has no administration in time range)    ED Course/ Medical Decision Making/ A&P Clinical Course as of 02/28/22 0333  Tue Feb 28, 2022  0331 Spoke with the patient's son.  He is aware of  the G tube replacement and d/c back to facility. [CH]    Clinical Course User Index [CH] Eleonore Shippee, Mayer Masker, MD                           Medical Decision Making Amount and/or Complexity of Data Reviewed Radiology: ordered.   This patient presents to the ED for concern of gastrostomy tube dislodgment, this involves an extensive number of treatment options, and is a complaint that carries with it a high risk of complications and morbidity.  I considered the following differential and admission for this acute, potentially life threatening condition.  The differential diagnosis includes tube dislodgment  MDM:    This is a 76 year old female who presents with a dislodged gastrostomy tube.  Gastrostomy tube not in place on my initial evaluation.  She is severely disabled and nonverbal.  I was able to place a 16 [place] Foley while trying to obtain a 16 [place] gastrostomy tube.  I reviewed her most recent IR procedure in the chart.  16 French gastrostomy tube was ultimately replaced without difficulty and x-ray shows that it is  appropriately placed in the stomach.  She appears at her baseline otherwise and vital signs of been reassuring.  Do not feel she needs lab work.  (Labs, imaging, consults)  Labs: I Ordered, and personally interpreted labs.  The pertinent results include: None  Imaging Studies ordered: I ordered imaging studies including tube placement x-ray I independently visualized and interpreted imaging. I agree with the radiologist interpretation  Additional history obtained from son, EMS.  External records from outside source obtained and reviewed including prior IR procedures  Cardiac Monitoring: The patient was maintained on a cardiac monitor.  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)    Essential (primary) hypertension    History of falling    MI (myocardial infarction) (HCC)    Stroke (HCC)      Medicines Meds ordered this encounter  Medications   diatrizoate meglumine-sodium (GASTROGRAFIN) 66-10 % solution    Pamala Duffel A: cabinet override    I have reviewed the patients home medicines and have made adjustments as needed  Problem List / ED Course: Problem List Items Addressed This Visit   None Visit Diagnoses     Dislodged gastrostomy tube    -  Primary                   Final Clinical Impression(s) / ED Diagnoses Final diagnoses:  Dislodged gastrostomy tube    Rx / DC Orders ED Discharge Orders     None         Shon Baton, MD 02/28/22 [id number]

## 2022-02-28 NOTE — ED Notes (Signed)
Pt resting comfortably. Respirations even and unlabored. NAD noted at this time

## 2022-04-07 ENCOUNTER — Non-Acute Institutional Stay: Payer: Medicare Other | Admitting: Hospice

## 2022-04-07 DIAGNOSIS — R5381 Other malaise: Secondary | ICD-10-CM

## 2022-04-07 DIAGNOSIS — R1312 Dysphagia, oropharyngeal phase: Secondary | ICD-10-CM

## 2022-04-07 DIAGNOSIS — F0390 Unspecified dementia without behavioral disturbance: Secondary | ICD-10-CM

## 2022-04-07 DIAGNOSIS — Z515 Encounter for palliative care: Secondary | ICD-10-CM

## 2022-04-07 NOTE — Progress Notes (Signed)
Therapist, nutritional Palliative Care Consult Note Telephone: [phone]  Fax: [phone]  PATIENT NAME: Tasha [name] DOB: Aug 15, 1946 MRN: [id number]  PRIMARY CARE PROVIDER:   Marinda Elk, MD Marinda Elk, MD [phone] [facility] [address],  [place] [address]  REFERRING PROVIDER: Marinda Elk, MD Marinda Elk, MD [phone] [facility] [address]  [place] [address]  RESPONSIBLE PARTY:  Brad Primary Emergency Contact: breauna, mazzeo Mobile Phone: [phone] Relation: Son Secondary Emergency Contact: Quaneshia, Wareing Mobile Phone: [phone] Relation: Daughter Contact Information     Name Relation Home Work [place], [name] Son   [phone]   Keyshla, [place] Daughter   [phone]       Visit is to build trust and highlight Palliative Medicine as specialized medical care for people living with serious illness, aimed at facilitating better quality of life through symptoms relief, assisting with advance care planning and complex medical decision making. This is a follow up visit.  Brad called and updated on visit for which he expressed appreciation.  RECOMMENDATIONS/PLAN:   Advance Care Planning/Code Status: Patient is a DO NOT RESUSCITATE  Goals of Care: Goals of care include to maximize quality of life and symptom management.  Family is not interested in hospice service at this time.  Palliative care team will continue to support patient, patient's family, and medical team.  Symptom management/Plan:  Physical debility: Secondary to CVA; minimally interactive, quadriplegic,  bed-bound, non verbal, nothing by mouth, nutrition via peg tube. Total care. Continue passive ROM restorative exercises, out of bed daily.  Dysphagia: NPO. PEG tube feeding Glucerna 1.2 Cal at 55 mL/h.  Aspiration precautions in place. Dementia: Fast 7D. Incontinent of bowel and bladder, minimally interactive. Continue ongoing supportive  care.  Patient will benefit from hospice service when family is ready for it.   Follow up: Palliative care will continue to follow for complex medical decision making, advance care planning, and clarification of goals. Return 6 weeks or prn. Encouraged to call provider sooner with any concerns.  CHIEF COMPLAINT: Palliative follow up  HISTORY OF PRESENT ILLNESS:  Tasha [name] a 76 y.o. 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 History of  hypertension, type 2 diabetes mellitus. History obtained from review of EMR, discussion with primary team, family and/or patient. Records reviewed and summarized above. All 10 point systems reviewed and are negative except as documented in history of present illness above  Review and summarization of Epic records shows history from other than patient.   Palliative Care was asked to follow this patient o help address complex decision making in the context of advance care planning and goals of care clarification.   PERTINENT MEDICATIONS:  Outpatient Encounter Medications as of 04/07/2022  Medication Sig   acetaminophen (TYLENOL) 325 MG tablet Take 650 mg by mouth every 6 (six) hours as needed.   Albuterol Sulfate 2.5 MG/0.5ML NEBU Inhale into the lungs every 6 (six) hours as needed.   amLODipine (NORVASC) 10 MG tablet Place 10 mg into feeding tube daily.   barrier cream (NON-SPECIFIED) CREA Apply 1 application topically 2 (two) times daily as needed (for protection.).   bisacodyl (DULCOLAX) 10 MG suppository Place 10 mg rectally as needed.   chlorhexidine (PERIDEX) 0.12 % solution Use as directed in the mouth or throat 2 (two) times daily.   clopidogrel (PLAVIX) 75 MG tablet Place 1 tablet (75 mg total) into feeding tube daily.  erythromycin ophthalmic ointment Place 1 application. into both eyes in the morning and at bedtime. FOR DRY EYE/INCOMPLETE BLINK   hydrALAZINE (APRESOLINE) 25 MG tablet  Place 25 mg into feeding tube every 8 (eight) hours.   insulin detemir (LEVEMIR) 100 UNIT/ML injection Inject 18 Units into the skin 2 (two) times daily.   Magnesium Hydroxide (MILK OF MAGNESIA PO) Take by mouth as needed.   metformin (FORTAMET) 500 MG (OSM) 24 hr tablet Take 500 mg by mouth 2 (two) times daily with a meal. VIA G-TUBE FOR DM   metoprolol tartrate (LOPRESSOR) 25 MG tablet Place 25 mg into feeding tube 2 (two) times daily.   Nutritional Supplements (FEEDING SUPPLEMENT, GLUCERNA 1.2 CAL,) LIQD Place into feeding tube every 6 (six) hours. Place at 60 ml/hr via PEG. Water flush to 125 ml Q6h.   pantoprazole sodium (PROTONIX) 40 mg/20 mL PACK Place 20 mLs (40 mg total) into feeding tube daily.   polyethylene glycol (MIRALAX / GLYCOLAX) 17 g packet Place 17 g into feeding tube daily as needed for mild constipation.   rosuvastatin (CRESTOR) 5 MG tablet Place 1 tablet (5 mg total) into feeding tube daily.   Sodium Phosphates (RA SALINE ENEMA RE) Give disposable Saline Enema rectally X 1 dose/24 hrs as needed   urea (CARMOL) 20 % cream Apply topically. TO BE APPLIED BOTH FEET DAILY   White Petrolatum (WHITE PETROLEUM JELLY EX) VASELINE WHITE PETROLEUM JELLY APPLY TO LIPS EVERY SHIFT TO PREVENT DRYING.   No facility-administered encounter medications on file as of 04/07/2022.    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   Amoxicillin-Pot Clavulanate Diarrhea   Atorvastatin Other (See Comments)    Unknown reaction - reported by  [facility] [place] clinic 05/10/2012   Azithromycin Other (See Comments)    Caused blisters inside and out   Codeine Other (See Comments)    Altered mental status   Lisinopril Other (See Comments)    Unknown reaction - reported by [facility], [place] clinic 12/23/2008 02/20/2020 she believes "reaction" was NP cough. No PMH of angioedema   Sulfa Antibiotics Diarrhea   Sulfamethoprim [Sulfamethoxazole-Trimethoprim] Diarrhea      I spent 45 minutes providing this consultation; this includes time spent with patient/family, chart review and documentation. More than 50% of the time in this consultation was spent on counseling and coordinating communication   Thank you for the opportunity to participate in the care of Tasha [name] Please call our office at [phone] if we can be of additional assistance.  Note: Portions of this note were generated with Scientist, clinical (histocompatibility and immunogenetics). Dictation errors may occur despite best attempts at proofreading.  Rosaura Carpenter, NP

## 2022-05-21 ENCOUNTER — Emergency Department (HOSPITAL_COMMUNITY)
Admission: EM | Admit: 2022-05-21 | Discharge: 2022-05-21 | Disposition: A | Discharge status: Skilled Nursing Facility | Payer: Medicare Other | Attending: Emergency Medicine | Admitting: Emergency Medicine

## 2022-05-21 ENCOUNTER — Emergency Department (HOSPITAL_COMMUNITY): Payer: Medicare Other

## 2022-05-21 ENCOUNTER — Encounter (HOSPITAL_COMMUNITY): Payer: Self-pay | Admitting: Emergency Medicine

## 2022-05-21 ENCOUNTER — Other Ambulatory Visit: Payer: Self-pay

## 2022-05-21 DIAGNOSIS — K9423 Gastrostomy malfunction: Secondary | ICD-10-CM | POA: Diagnosis present

## 2022-05-21 DIAGNOSIS — Z4659 Encounter for fitting and adjustment of other gastrointestinal appliance and device: Secondary | ICD-10-CM | POA: Diagnosis not present

## 2022-05-21 DIAGNOSIS — E119 Type 2 diabetes mellitus without complications: Secondary | ICD-10-CM | POA: Insufficient documentation

## 2022-05-21 DIAGNOSIS — I10 Essential (primary) hypertension: Secondary | ICD-10-CM | POA: Insufficient documentation

## 2022-05-21 MED ORDER — IOHEXOL 300 MG/ML  SOLN
30.0000 mL | Freq: Once | INTRAMUSCULAR | Status: AC | PRN
  Administered 2022-05-21: 30 mL

## 2022-05-21 NOTE — ED Triage Notes (Signed)
Patient from [facility], patient dislodged feeding tube.  Patient has a 13F feeding tube.

## 2022-05-21 NOTE — ED Provider Notes (Signed)
Patient is here after feeding tube was dislodged.  Awaiting imaging to ensure place correctly.  Initial plain film shows concern for possible extraluminal fluid.  Recommended a CT of the abdomen.  CT of the abdomen with appropriate placement.  Patient's husband was notified.  Patient discharged back to facility.   Blanchie Dessert, MD 05/21/22 1015

## 2022-05-21 NOTE — Discharge Instructions (Signed)
You were evaluated in the Emergency Department and after careful evaluation, we did not find any emergent condition requiring admission or further testing in the hospital.  Your exam/testing today was overall reassuring.  We replaced your feeding tube here in the Emergency Dept.  Please return to the Emergency Department if you experience any worsening of your condition.  Thank you for allowing [place] to be a part of your care.

## 2022-05-21 NOTE — ED Notes (Signed)
Patient is resting comfortably. Chest rise and fall noted.

## 2022-05-21 NOTE — ED Provider Notes (Signed)
[facility] Emergency Department Provider Note MRN:  [id number]  Arrival date & time: 05/21/22     Chief Complaint   Disloged Feeding Tube   History of Present Illness   Tasha [name] is a 76 y.o. year-old female with a history of hypertension, stroke, diabetes presenting to the ED with chief complaint of dislodged feeding tube.  Sent from facility for feeding tube replacement, was dislodged.  Patient is nonverbal and severely disabled at baseline.  Review of Systems  I was unable to obtain a full/accurate HPI, PMH, or ROS due to the patient's disability, nonverbal status.  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)    Essential (primary) hypertension    History of falling    MI (myocardial infarction) ([place])    Stroke [facility])     Past Surgical History:  Procedure Laterality Date   ABDOMINAL HYSTERECTOMY     APPENDECTOMY     BACK SURGERY     IR CM INJ ANY COLONIC TUBE W/FLUORO  09/27/2020   IR GASTROSTOMY TUBE MOD SED  03/31/2020   IR REPLACE G-TUBE SIMPLE WO FLUORO  06/21/2021   IR REPLACE G-TUBE SIMPLE WO FLUORO  11/03/2021   IR [place] GASTRO/COLONIC TUBE PERCUT W/FLUORO  09/27/2021    Family History  Adopted: Yes  Family history unknown: Yes    Social History   Socioeconomic History   Marital status: Divorced    Spouse name: Not on file   Number of children: Not on file   Years of education: Not on file   Highest education level: Not on file  Occupational History   Not on file  Tobacco Use   Smoking status: Never   Smokeless tobacco: Never  Vaping Use   Vaping Use: Never used  Substance and Sexual Activity   Alcohol use: Not Currently    Comment: occasionally   Drug use: Never   Sexual activity: Not on file  Other Topics Concern   Not on file  Social History Narrative   Not on file   Social Determinants of Health   Financial Resource Strain:  Not on file  Food Insecurity: Not on file  Transportation Needs: Not on file  Physical Activity: Not on file  Stress: Not on file  Social Connections: Not on file  Intimate Partner Violence: Not on file     Physical Exam   Vitals:   05/21/22 0618  BP: (!) 156/74  Pulse: 74  Resp: 12  Temp: 98.4 F (36.9 C)  SpO2: 97%    CONSTITUTIONAL: Chronically ill-appearing, NAD NEURO/PSYCH: Awake, does not respond to questions, does not follow commands EYES:  eyes equal and reactive ENT/NECK:  no LAD, no JVD CARDIO: Regular rate, well-perfused, normal S1 and S2 PULM:  CTAB no wheezing or rhonchi GI/GU:  non-distended, non-tender MSK/SPINE:  No gross deformities, no edema SKIN:  no rash, atraumatic   *Additional and/or pertinent findings included in MDM below  Diagnostic and Interventional Summary    EKG Interpretation  Date/Time:    Ventricular Rate:    PR Interval:    QRS Duration:   QT Interval:    QTC Calculation:   R Axis:     Text Interpretation:         Labs Reviewed - No data to display  DG ABDOMEN PEG TUBE LOCATION    (Results Pending)    Medications - No data to display  Procedures  /  Critical Care FEEDING TUBE REPLACEMENT  Date/Time: 05/21/2022 6:55 AM  Performed by: Sabas Sous, MD Authorized by: Sabas Sous, MD  Consent: The procedure was performed in an emergent situation. Patient identity confirmed: hospital-assigned identification number Time out: Immediately prior to procedure a "time out" was called to verify the correct patient, procedure, equipment, support staff and site/side marked as required. Preparation: Patient was prepped and draped in the usual sterile fashion. Indications: tube dislodged Local anesthesia used: no  Anesthesia: Local anesthesia used: no  Sedation: Patient sedated: no  Tube type: gastrostomy Patient position: supine Tube size: 16 Fr Bulb inflation volume: 4 (ml) Bulb inflation fluid: sterile  water Placement/position confirmation: x-ray Tube placement difficulty: none Patient tolerance: patient tolerated the procedure well with no immediate complications     ED Course and Medical Decision Making  Initial Impression and Ddx Dislodged feeding tube.  On my initial assessment patient's feeding tube stoma has a 16 French Foley catheter maintaining its diameter.  This was exchanged for 16 [place] G-tube as described above.  Awaiting confirmatory x-ray.  Past medical/surgical history that increases complexity of ED encounter: Cognitive impairment, stroke  Interpretation of Diagnostics Awaiting x-ray  Patient Reassessment and Ultimate Disposition/Management     Signed out to oncoming provider, if reassuring placement on x-ray anticipating discharge.  Patient management required discussion with the following services or consulting groups:  None  Complexity of Problems Addressed Acute illness or injury that poses threat of life of bodily function  Additional Data Reviewed and Analyzed Further history obtained from: None  Additional Factors Impacting ED Encounter Risk Minor Procedures  Elmer Sow. Pilar Plate, MD [facility] Emergency Medicine [facility] [email or web address]  Final Clinical Impressions(s) / ED Diagnoses     ICD-10-CM   1. Encounter for care related to feeding tube  Z46.59       ED Discharge Orders     None        Discharge Instructions Discussed with and Provided to Patient:    Discharge Instructions      You were evaluated in the Emergency Department and after careful evaluation, we did not find any emergent condition requiring admission or further testing in the hospital.  Your exam/testing today was overall reassuring.  We replaced your feeding tube here in the Emergency Dept.  Please return to the Emergency Department if you experience any worsening of your condition.  Thank you for allowing [place] to be a part of your  care.       Sabas Sous, MD 05/21/22 [phone]

## 2022-07-25 ENCOUNTER — Non-Acute Institutional Stay: Payer: Medicare Other | Admitting: Hospice

## 2022-07-25 DIAGNOSIS — R5381 Other malaise: Secondary | ICD-10-CM

## 2022-07-25 DIAGNOSIS — Z515 Encounter for palliative care: Secondary | ICD-10-CM

## 2022-07-25 DIAGNOSIS — F0390 Unspecified dementia without behavioral disturbance: Secondary | ICD-10-CM

## 2022-07-25 DIAGNOSIS — R1312 Dysphagia, oropharyngeal phase: Secondary | ICD-10-CM

## 2022-07-25 DIAGNOSIS — U071 COVID-19: Secondary | ICD-10-CM

## 2022-07-25 NOTE — Progress Notes (Signed)
Therapist, nutritional Palliative Care Consult Note Telephone: [phone]  Fax: [phone]  PATIENT NAME: Tasha [name] DOB: Jul 14, 1946 MRN: [id number]  PRIMARY CARE PROVIDER:   Marinda Elk, MD Marinda Elk, MD [phone] [facility] [address]  [place] [address]  REFERRING PROVIDER: Marinda Elk, MD Marinda Elk, MD [phone] [facility] [address]  [place] [address]  RESPONSIBLE PARTY:  Brad Primary Emergency Contact: niamh, rada Mobile Phone: [phone] Relation: Son Secondary Emergency Contact: Sonika, [place] Mobile Phone: [phone] Relation: Daughter Contact Information     Name Relation Home Work [place], [name] Son   [phone]   Tyrone, [place] Daughter   [phone]       Visit is to build trust and highlight Palliative Medicine as specialized medical care for people living with serious illness, aimed at facilitating better quality of life through symptoms relief, assisting with advance care planning and complex medical decision making. This is a follow up visit.  Brad called and updated on visit for which he expressed appreciation.  RECOMMENDATIONS/PLAN:   Advance Care Planning/Code Status: Patient is a DO NOT RESUSCITATE  Goals of Care: Goals of care include to maximize quality of life and symptom management.   Patient will benefit from hospice service, family not on board at this time.  NP left message with facility social worker seeking collaboration on how to work with family to ensure patient's optimal care.  Symptom management/Plan:  Physical debility: Chronic, secondary to CVA; minimally interactive, quadriplegic,  bed-bound, non verbal, nothing by mouth, nutrition via peg tube. Total care. Continue passive ROM restorative exercises, out of bed daily.  Dysphagia: NPO. PEG tube feeding Glucerna 1.2 Cal at 55 mL/h.  Aspiration precautions in place. Dementia: Fast 7D. Incontinent of bowel and  bladder, minimally interactive. Continue ongoing supportive care.  Recent COVID-19 infection for which patient reacted adversely to Paxlovid, Discontinued.  Currently on oxygen supplementation 2 L/min, albuterol breathing treatment, Claritin.  Checks x-ray results pending. Check vital signs as ordered, continue ongoing supportive care. Patient will benefit from hospice service when family is ready for it.   Follow up: Palliative care will continue to follow for complex medical decision making, advance care planning, and clarification of goals. Return 6 weeks or prn. Encouraged to call provider sooner with any concerns.  CHIEF COMPLAINT: Palliative follow up  HISTORY OF PRESENT ILLNESS:  Tasha [name] a 76 y.o. 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 History of  hypertension, type 2 diabetes mellitus.  history obtained from review of EMR, discussion with primary team, family and/or patient. Records reviewed and summarized above. All 10 point systems reviewed and are negative except as documented in history of present illness above  Review and summarization of Epic records shows history from other than patient.   Palliative Care was asked to follow this patient to help address complex decision making in the context of advance care planning and goals of care clarification.   PERTINENT MEDICATIONS:  Outpatient Encounter Medications as of 07/25/2022  Medication Sig   acetaminophen (TYLENOL) 325 MG tablet Take 650 mg by mouth every 6 (six) hours as needed.   Albuterol Sulfate 2.5 MG/0.5ML NEBU Inhale into the lungs every 6 (six) hours as needed.   amLODipine (NORVASC) 10 MG tablet Place 10 mg into feeding tube daily.   barrier cream (NON-SPECIFIED) CREA Apply 1 application topically 2 (two) times daily as needed (for protection.).  bisacodyl (DULCOLAX) 10 MG suppository Place 10 mg rectally as needed.   chlorhexidine (PERIDEX) 0.12 %  solution Use as directed in the mouth or throat 2 (two) times daily.   clopidogrel (PLAVIX) 75 MG tablet Place 1 tablet (75 mg total) into feeding tube daily.   erythromycin ophthalmic ointment Place 1 application. into both eyes in the morning and at bedtime. FOR DRY EYE/INCOMPLETE BLINK   hydrALAZINE (APRESOLINE) 25 MG tablet Place 25 mg into feeding tube every 8 (eight) hours.   insulin detemir (LEVEMIR) 100 UNIT/ML injection Inject 18 Units into the skin 2 (two) times daily.   Magnesium Hydroxide (MILK OF MAGNESIA PO) Take by mouth as needed.   metformin (FORTAMET) 500 MG (OSM) 24 hr tablet Take 500 mg by mouth 2 (two) times daily with a meal. VIA G-TUBE FOR DM   metoprolol tartrate (LOPRESSOR) 25 MG tablet Place 25 mg into feeding tube 2 (two) times daily.   Nutritional Supplements (FEEDING SUPPLEMENT, GLUCERNA 1.2 CAL,) LIQD Place into feeding tube every 6 (six) hours. Place at 60 ml/hr via PEG. Water flush to 125 ml Q6h.   pantoprazole sodium (PROTONIX) 40 mg/20 mL PACK Place 20 mLs (40 mg total) into feeding tube daily.   polyethylene glycol (MIRALAX / GLYCOLAX) 17 g packet Place 17 g into feeding tube daily as needed for mild constipation.   rosuvastatin (CRESTOR) 5 MG tablet Place 1 tablet (5 mg total) into feeding tube daily.   Sodium Phosphates (RA SALINE ENEMA RE) Give disposable Saline Enema rectally X 1 dose/24 hrs as needed   urea (CARMOL) 20 % cream Apply topically. TO BE APPLIED BOTH FEET DAILY   White Petrolatum (WHITE PETROLEUM JELLY EX) VASELINE WHITE PETROLEUM JELLY APPLY TO LIPS EVERY SHIFT TO PREVENT DRYING.   No facility-administered encounter medications on file as of 07/25/2022.    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   Amoxicillin-Pot Clavulanate Diarrhea   Atorvastatin Other (See Comments)    Unknown reaction - reported by [facility], [place] clinic 05/10/2012   Azithromycin Other (See Comments)    Caused blisters inside and out   Codeine Other (See Comments)    Altered mental status   Lisinopril Other (See Comments)    Unknown reaction - reported by [facility], [place] clinic 12/23/2008 02/20/2020 she believes "reaction" was NP cough. No PMH of angioedema   Sulfa Antibiotics Diarrhea   Sulfamethoprim [Sulfamethoxazole-Trimethoprim] Diarrhea      I spent 40 minutes providing this consultation; this includes time spent with patient/family, chart review and documentation. More than 50% of the time in this consultation was spent on counseling and coordinating communication   Thank you for the opportunity to participate in the care of Cydne Grahn Please call our office at [phone] if we can be of additional assistance.  Note: Portions of this note were generated with Scientist, clinical (histocompatibility and immunogenetics). Dictation errors may occur despite best attempts at proofreading.  Rosaura Carpenter, NP

## 2022-07-26 ENCOUNTER — Emergency Department (HOSPITAL_COMMUNITY)
Admission: EM | Admit: 2022-07-26 | Discharge: 2022-07-26 | Disposition: A | Discharge status: Rehabilitation Facility | Payer: Medicare Other | Attending: Emergency Medicine | Admitting: Emergency Medicine

## 2022-07-26 DIAGNOSIS — J9601 Acute respiratory failure with hypoxia: Secondary | ICD-10-CM | POA: Insufficient documentation

## 2022-07-26 DIAGNOSIS — Z79899 Other long term (current) drug therapy: Secondary | ICD-10-CM | POA: Insufficient documentation

## 2022-07-26 DIAGNOSIS — R0602 Shortness of breath: Secondary | ICD-10-CM | POA: Diagnosis present

## 2022-07-26 DIAGNOSIS — Z794 Long term (current) use of insulin: Secondary | ICD-10-CM | POA: Insufficient documentation

## 2022-07-26 DIAGNOSIS — I10 Essential (primary) hypertension: Secondary | ICD-10-CM | POA: Diagnosis not present

## 2022-07-26 DIAGNOSIS — Z7902 Long term (current) use of antithrombotics/antiplatelets: Secondary | ICD-10-CM | POA: Diagnosis not present

## 2022-07-26 DIAGNOSIS — U071 COVID-19: Secondary | ICD-10-CM | POA: Diagnosis not present

## 2022-07-26 DIAGNOSIS — F0390 Unspecified dementia without behavioral disturbance: Secondary | ICD-10-CM | POA: Diagnosis not present

## 2022-07-26 DIAGNOSIS — Z8673 Personal history of transient ischemic attack (TIA), and cerebral infarction without residual deficits: Secondary | ICD-10-CM | POA: Insufficient documentation

## 2022-07-26 DIAGNOSIS — R4182 Altered mental status, unspecified: Secondary | ICD-10-CM | POA: Insufficient documentation

## 2022-07-26 MED ORDER — GLYCOPYRROLATE 0.2 MG/ML IJ SOLN: 0.2000 mg | INTRAMUSCULAR | Status: DC | PRN

## 2022-07-26 MED ORDER — MORPHINE 100MG IN NS 100ML (1MG/ML) PREMIX INFUSION
2.0000 mg/h | INTRAVENOUS | Status: DC
  Administered 2022-07-26: 2 mg/h via INTRAVENOUS
  Filled 2022-07-26: qty 100

## 2022-07-26 MED ORDER — GLYCOPYRROLATE 1 MG PO TABS: 1.0000 mg | ORAL_TABLET | ORAL | Status: DC | PRN

## 2022-07-26 MED ORDER — ACETAMINOPHEN 650 MG RE SUPP: 650.0000 mg | Freq: Four times a day (QID) | RECTAL | Status: DC | PRN

## 2022-07-26 MED ORDER — ACETAMINOPHEN 325 MG PO TABS: 650.0000 mg | ORAL_TABLET | Freq: Four times a day (QID) | ORAL | Status: DC | PRN

## 2022-07-26 MED ORDER — POLYVINYL ALCOHOL 1.4 % OP SOLN: 1.0000 [drp] | Freq: Four times a day (QID) | OPHTHALMIC | Status: DC | PRN

## 2022-07-26 NOTE — ED Provider Notes (Signed)
[facility] EMERGENCY DEPARTMENT Provider Note   CSN: [id number] Arrival date & time: 07/26/22  1923     History  Chief Complaint  Patient presents with   Shortness of Breath    Tasha [name] is a 76 y.o. female.  Level 5 caveat due to altered mental status.  Patient with COVID the last couple days.  Lives at [facility].  Dementia, stroke history.  Nonverbal at baseline.  Family was considering putting her in hospice and comfort care at facility but ultimately was sent here for evaluation as she has been having respiratory distress, hypoxia.  Brought here by EMS.  Patient hypotensive with EMS.  On a nonrebreather satting 90%.  Patient does have DNR form.  She has a history of stroke and hypertension.  The history is provided by the EMS personnel and a caregiver.       Home Medications Prior to Admission medications   Medication Sig Start Date End Date Taking? Authorizing Provider  acetaminophen (TYLENOL) 325 MG tablet Take 650 mg by mouth every 6 (six) hours as needed.    [provider]  Albuterol Sulfate 2.5 MG/0.5ML NEBU Inhale into the lungs every 6 (six) hours as needed.    [provider]  amLODipine (NORVASC) 10 MG tablet Place 10 mg into feeding tube daily.    [provider]  barrier cream (NON-SPECIFIED) CREA Apply 1 application topically 2 (two) times daily as needed (for protection.).    [provider]  bisacodyl (DULCOLAX) 10 MG suppository Place 10 mg rectally as needed.    [provider]  chlorhexidine (PERIDEX) 0.12 % solution Use as directed in the mouth or throat 2 (two) times daily.    [provider]  clopidogrel (PLAVIX) 75 MG tablet Place 1 tablet (75 mg total) into feeding tube daily. 04/27/20   Hosie Poisson, MD  erythromycin ophthalmic ointment Place 1 application. into both eyes in the morning and at bedtime. FOR DRY EYE/INCOMPLETE BLINK    [provider]  hydrALAZINE  (APRESOLINE) 25 MG tablet Place 25 mg into feeding tube every 8 (eight) hours.    [provider]  insulin detemir (LEVEMIR) 100 UNIT/ML injection Inject 18 Units into the skin 2 (two) times daily.    [provider]  Magnesium Hydroxide (MILK OF MAGNESIA PO) Take by mouth as needed.    [provider]  metformin (FORTAMET) 500 MG (OSM) 24 hr tablet Take 500 mg by mouth 2 (two) times daily with a meal. VIA G-TUBE FOR DM    [provider]  metoprolol tartrate (LOPRESSOR) 25 MG tablet Place 25 mg into feeding tube 2 (two) times daily.    [provider]  Nutritional Supplements (FEEDING SUPPLEMENT, GLUCERNA 1.2 CAL,) LIQD Place into feeding tube every 6 (six) hours. Place at 60 ml/hr via PEG. Water flush to 125 ml Q6h.    [provider]  pantoprazole sodium (PROTONIX) 40 mg/20 mL PACK Place 20 mLs (40 mg total) into feeding tube daily. 04/27/20   Hosie Poisson, MD  polyethylene glycol (MIRALAX / GLYCOLAX) 17 g packet Place 17 g into feeding tube daily as needed for mild constipation. 04/27/20   Hosie Poisson, MD  rosuvastatin (CRESTOR) 5 MG tablet Place 1 tablet (5 mg total) into feeding tube daily. 04/27/20   Hosie Poisson, MD  Sodium Phosphates (RA SALINE ENEMA RE) Give disposable Saline Enema rectally X 1 dose/24 hrs as needed    [provider]  urea (CARMOL)  20 % cream Apply topically. TO BE APPLIED BOTH FEET DAILY    [provider]  White Petrolatum (WHITE PETROLEUM JELLY EX) VASELINE WHITE PETROLEUM JELLY APPLY TO LIPS EVERY SHIFT TO PREVENT DRYING.    [provider]      Allergies    Allopurinol, Tetracycline, Amoxicillin-pot clavulanate, Atorvastatin, Azithromycin, Codeine, Lisinopril, Sulfa antibiotics, and Sulfamethoprim [sulfamethoxazole-trimethoprim]    Review of Systems   Review of Systems  Physical Exam Updated Vital Signs BP (!) 68/37   Pulse 95   Resp (!) 32   SpO2 93%  Physical  Exam Constitutional:      General: She is in acute distress.     Appearance: She is ill-appearing.  HENT:     Head: Normocephalic.     Mouth/Throat:     Mouth: Mucous membranes are moist.  Eyes:     Pupils: Pupils are equal, round, and reactive to light.  Cardiovascular:     Rate and Rhythm: Normal rate.     Pulses: Normal pulses.  Pulmonary:     Effort: Tachypnea present.     Breath sounds: Decreased breath sounds and wheezing present.  Musculoskeletal:        General: Normal range of motion.     Cervical back: Normal range of motion.     Right lower leg: No edema.  Skin:    General: Skin is warm.  Neurological:     Comments: GCS 3     ED Results / Procedures / Treatments   Labs (all labs ordered are listed, but only abnormal results are displayed) Labs Reviewed - No data to display  EKG None  Radiology No results found.  Procedures Procedures    Medications Ordered in ED Medications  acetaminophen (TYLENOL) tablet 650 mg (has no administration in time range)    Or  acetaminophen (TYLENOL) suppository 650 mg (has no administration in time range)  glycopyrrolate (ROBINUL) tablet 1 mg (has no administration in time range)    Or  glycopyrrolate (ROBINUL) injection 0.2 mg (has no administration in time range)    Or  glycopyrrolate (ROBINUL) injection 0.2 mg (has no administration in time range)  polyvinyl alcohol (LIQUIFILM TEARS) 1.4 % ophthalmic solution 1 drop (has no administration in time range)  morphine 100mg  in NS 156mL (1mg /mL) infusion - premix (2 mg/hr Intravenous New Bag/Given 07/26/22 1952)    ED Course/ Medical Decision Making/ A&P                           Medical Decision Making Risk OTC drugs. Prescription drug management.   Tasha [name] is here with altered mental status, shortness of breath.  She has been diagnosed with COVID a couple days ago.  Lives at [facility].  She has a history of stroke, MI.  She is nonverbal.  I talked  with family on the phone when patient arrived.  She is a DNR.  She was sent from [facility] due to respiratory distress.  Family has been in the midst of talking about doing hospice and comfort care but decisions had not been made on that yet so they sent her for evaluation.  Talking on the phone with her son [name] I recommended that she become comfort care.  She is hypotensive, on a nonrebreather satting 92% and tachypneic in the 30s.  She seems to be undergoing respiratory failure likely in the setting of COVID.  I suspect that she will pass away here in  the next few hours or day.  After talking with family and talking with [facility] facility.  Patient was made comfort care.  Started on morphine infusion for comfort.  Will try to transition her and transport her back to [facility] rehab where they are able to provide hospice and comfort care after talking with my social work team to talk with [facility].  Family has been updated of plan.  They are okay with this plan.  I will continue to monitor patient while she awaits transfer back to facility.  This chart was dictated using voice recognition software.  Despite best efforts to proofread,  errors can occur which can change the documentation meaning.         Final Clinical Impression(s) / ED Diagnoses Final diagnoses:  COVID-19  Acute respiratory failure with hypoxia [facility])    Rx / DC Orders ED Discharge Orders     None         Virgina Norfolk, DO 07/26/22 2025

## 2022-07-26 NOTE — ED Notes (Addendum)
Patient in bed, unable to answer questions or follow commands. No attempts made. Patient is grunting loudly, visibly diaphoretic. no family at bedside at this time.

## 2022-07-26 NOTE — ED Notes (Signed)
EMS here for patient. Patient transferred to EMS stretcher, oxygen transferred over as well. Patient at this time has had no mental status change.

## 2022-07-26 NOTE — Progress Notes (Signed)
TOC CSW spoke with Jasmine at [place] [place] [facility] [phone], who spoke with Ranata, RN/DON at [facility].  Ranata stated that Tasha [name] is ready to receive pt back and will start Hospice Care tomorrow morning.  Analeia Ismael Tarpley-Carter, MSW, LCSW-A Pronouns:  She/Her/Hers [facility] Clinical Social Worker Direct Number:  [phone] [email or web address]

## 2022-07-26 NOTE — Progress Notes (Signed)
TOC CSW received a return call from [facility] with [facility] [phone].  Karel Jarvis stated she will begin contacting family and planning pt Hospice Care at [facility].  Cayce Paschal Tarpley-Carter, MSW, LCSW-A Pronouns:  She/Her/Hers [facility] Clinical Social Worker Direct Number:  [phone] [email or web address]

## 2022-07-26 NOTE — Discharge Instructions (Signed)
Continue oxygen and comfort care measures at facility.

## 2022-07-26 NOTE — Progress Notes (Addendum)
TOC CSW spoke with Dr. Who expressed concern for pts current state and Hospice Care. CSW contacted [facility] Liaison at [facility] [phone].  CSW awaiting a return call.  Latoyna Hird Tarpley-Carter, MSW, LCSW-A Pronouns:  She/Her/Hers [facility] Clinical Social Worker Direct Number:  [phone] [email or web address]

## 2022-07-26 NOTE — Progress Notes (Incomplete)
Contacted by staff to provide presence and support for this patient. {patient is COVID positive)

## 2022-07-28 DEATH — deceased
